# Patient Record
Sex: Male | Born: 1960
Health system: Southern US, Community
[De-identification: ages and names within clinical notes are randomized; demographics above are authoritative.]

## PROBLEM LIST (undated history)

## (undated) DIAGNOSIS — E46 Unspecified protein-calorie malnutrition: Secondary | ICD-10-CM

## (undated) DIAGNOSIS — N189 Chronic kidney disease, unspecified: Secondary | ICD-10-CM

## (undated) DIAGNOSIS — J189 Pneumonia, unspecified organism: Secondary | ICD-10-CM

## (undated) DIAGNOSIS — E785 Hyperlipidemia, unspecified: Secondary | ICD-10-CM

## (undated) DIAGNOSIS — Z8619 Personal history of other infectious and parasitic diseases: Secondary | ICD-10-CM

## (undated) DIAGNOSIS — Z86718 Personal history of other venous thrombosis and embolism: Secondary | ICD-10-CM

## (undated) DIAGNOSIS — M25552 Pain in left hip: Secondary | ICD-10-CM

## (undated) DIAGNOSIS — B20 Human immunodeficiency virus [HIV] disease: Secondary | ICD-10-CM

## (undated) DIAGNOSIS — Z21 Asymptomatic human immunodeficiency virus [HIV] infection status: Secondary | ICD-10-CM

## (undated) HISTORY — DX: Personal history of other infectious and parasitic diseases: Z86.19

## (undated) HISTORY — DX: Hyperlipidemia, unspecified: E78.5

## (undated) HISTORY — DX: Personal history of other venous thrombosis and embolism: Z86.718

## (undated) HISTORY — PX: APPENDECTOMY: SHX54

## (undated) HISTORY — DX: Pain in left hip: M25.552

---

## 1998-02-02 ENCOUNTER — Encounter (HOSPITAL_COMMUNITY): Admission: RE | Admit: 1998-02-02 | Discharge: 1998-05-03 | Payer: Self-pay | Admitting: Family Medicine

## 1998-07-13 ENCOUNTER — Encounter (HOSPITAL_COMMUNITY): Admission: RE | Admit: 1998-07-13 | Discharge: 1998-10-11 | Payer: Self-pay | Admitting: *Deleted

## 1998-07-19 ENCOUNTER — Encounter: Admission: RE | Admit: 1998-07-19 | Discharge: 1998-07-19 | Payer: Self-pay | Admitting: Hematology and Oncology

## 1998-07-28 ENCOUNTER — Encounter: Admission: RE | Admit: 1998-07-28 | Discharge: 1998-07-28 | Payer: Self-pay | Admitting: Hematology and Oncology

## 1998-08-07 ENCOUNTER — Encounter: Payer: Self-pay | Admitting: Emergency Medicine

## 1998-08-07 ENCOUNTER — Emergency Department (HOSPITAL_COMMUNITY): Admission: EM | Admit: 1998-08-07 | Discharge: 1998-08-07 | Payer: Self-pay | Admitting: Emergency Medicine

## 1998-08-12 ENCOUNTER — Encounter: Admission: RE | Admit: 1998-08-12 | Discharge: 1998-08-12 | Payer: Self-pay | Admitting: Internal Medicine

## 1998-09-14 ENCOUNTER — Encounter: Admission: RE | Admit: 1998-09-14 | Discharge: 1998-09-14 | Payer: Self-pay | Admitting: Internal Medicine

## 1998-10-13 ENCOUNTER — Encounter (HOSPITAL_COMMUNITY): Admission: RE | Admit: 1998-10-13 | Discharge: 1999-01-11 | Payer: Self-pay | Admitting: *Deleted

## 1999-02-10 ENCOUNTER — Encounter: Admission: RE | Admit: 1999-02-10 | Discharge: 1999-02-10 | Payer: Self-pay | Admitting: Hematology and Oncology

## 1999-02-10 ENCOUNTER — Ambulatory Visit (HOSPITAL_COMMUNITY): Admission: RE | Admit: 1999-02-10 | Discharge: 1999-02-10 | Payer: Self-pay | Admitting: Hematology and Oncology

## 1999-02-10 ENCOUNTER — Encounter (INDEPENDENT_AMBULATORY_CARE_PROVIDER_SITE_OTHER): Payer: Self-pay | Admitting: *Deleted

## 1999-02-10 LAB — CONVERTED CEMR LAB: CD4 Count: 160 microliters

## 1999-02-16 ENCOUNTER — Encounter (HOSPITAL_COMMUNITY): Admission: RE | Admit: 1999-02-16 | Discharge: 1999-05-17 | Payer: Self-pay | Admitting: *Deleted

## 1999-05-18 ENCOUNTER — Encounter (HOSPITAL_COMMUNITY): Admission: RE | Admit: 1999-05-18 | Discharge: 1999-05-30 | Payer: Self-pay | Admitting: *Deleted

## 1999-05-31 ENCOUNTER — Encounter (HOSPITAL_COMMUNITY): Admission: RE | Admit: 1999-05-31 | Discharge: 1999-08-29 | Payer: Self-pay | Admitting: *Deleted

## 1999-08-11 ENCOUNTER — Ambulatory Visit (HOSPITAL_COMMUNITY): Admission: RE | Admit: 1999-08-11 | Discharge: 1999-08-11 | Payer: Self-pay | Admitting: Hematology and Oncology

## 1999-08-11 ENCOUNTER — Encounter: Admission: RE | Admit: 1999-08-11 | Discharge: 1999-08-11 | Payer: Self-pay | Admitting: Hematology and Oncology

## 1999-08-17 ENCOUNTER — Encounter (HOSPITAL_COMMUNITY): Admission: RE | Admit: 1999-08-17 | Discharge: 1999-11-15 | Payer: Self-pay | Admitting: *Deleted

## 1999-08-25 ENCOUNTER — Encounter: Admission: RE | Admit: 1999-08-25 | Discharge: 1999-08-25 | Payer: Self-pay | Admitting: Internal Medicine

## 1999-08-30 ENCOUNTER — Encounter (HOSPITAL_COMMUNITY): Admission: RE | Admit: 1999-08-30 | Discharge: 1999-11-28 | Payer: Self-pay | Admitting: *Deleted

## 1999-12-01 ENCOUNTER — Encounter: Admission: RE | Admit: 1999-12-01 | Discharge: 1999-12-01 | Payer: Self-pay | Admitting: Hematology and Oncology

## 1999-12-01 ENCOUNTER — Ambulatory Visit (HOSPITAL_COMMUNITY): Admission: RE | Admit: 1999-12-01 | Discharge: 1999-12-01 | Payer: Self-pay | Admitting: Internal Medicine

## 1999-12-21 ENCOUNTER — Ambulatory Visit (HOSPITAL_COMMUNITY): Admission: RE | Admit: 1999-12-21 | Discharge: 1999-12-21 | Payer: Self-pay | Admitting: *Deleted

## 2000-01-18 ENCOUNTER — Encounter (HOSPITAL_COMMUNITY): Admission: RE | Admit: 2000-01-18 | Discharge: 2000-04-17 | Payer: Self-pay | Admitting: *Deleted

## 2000-04-18 ENCOUNTER — Encounter (HOSPITAL_COMMUNITY): Admission: RE | Admit: 2000-04-18 | Discharge: 2000-07-17 | Payer: Self-pay | Admitting: *Deleted

## 2000-07-18 ENCOUNTER — Encounter (HOSPITAL_COMMUNITY): Admission: RE | Admit: 2000-07-18 | Discharge: 2000-10-16 | Payer: Self-pay | Admitting: *Deleted

## 2000-07-30 ENCOUNTER — Encounter: Admission: RE | Admit: 2000-07-30 | Discharge: 2000-07-30 | Payer: Self-pay | Admitting: Internal Medicine

## 2000-07-31 ENCOUNTER — Ambulatory Visit (HOSPITAL_COMMUNITY): Admission: RE | Admit: 2000-07-31 | Discharge: 2000-07-31 | Payer: Self-pay | Admitting: Hematology and Oncology

## 2000-09-24 ENCOUNTER — Encounter: Admission: RE | Admit: 2000-09-24 | Discharge: 2000-09-24 | Payer: Self-pay | Admitting: Internal Medicine

## 2000-10-17 ENCOUNTER — Encounter (HOSPITAL_COMMUNITY): Admission: RE | Admit: 2000-10-17 | Discharge: 2001-01-15 | Payer: Self-pay | Admitting: *Deleted

## 2000-11-14 ENCOUNTER — Encounter: Admission: RE | Admit: 2000-11-14 | Discharge: 2000-11-14 | Payer: Self-pay | Admitting: Internal Medicine

## 2000-11-14 ENCOUNTER — Ambulatory Visit (HOSPITAL_COMMUNITY): Admission: RE | Admit: 2000-11-14 | Discharge: 2000-11-14 | Payer: Self-pay | Admitting: Internal Medicine

## 2000-11-27 ENCOUNTER — Encounter: Admission: RE | Admit: 2000-11-27 | Discharge: 2000-11-27 | Payer: Self-pay | Admitting: Hematology and Oncology

## 2001-01-16 ENCOUNTER — Encounter (HOSPITAL_COMMUNITY): Admission: RE | Admit: 2001-01-16 | Discharge: 2001-04-16 | Payer: Self-pay | Admitting: *Deleted

## 2001-04-17 ENCOUNTER — Encounter (HOSPITAL_COMMUNITY): Admission: RE | Admit: 2001-04-17 | Discharge: 2001-07-16 | Payer: Self-pay | Admitting: *Deleted

## 2001-05-15 ENCOUNTER — Ambulatory Visit (HOSPITAL_COMMUNITY): Admission: RE | Admit: 2001-05-15 | Discharge: 2001-05-15 | Payer: Self-pay | Admitting: Internal Medicine

## 2001-05-15 ENCOUNTER — Encounter: Admission: RE | Admit: 2001-05-15 | Discharge: 2001-05-15 | Payer: Self-pay | Admitting: Internal Medicine

## 2001-05-20 ENCOUNTER — Emergency Department (HOSPITAL_COMMUNITY): Admission: EM | Admit: 2001-05-20 | Discharge: 2001-05-20 | Payer: Self-pay | Admitting: Emergency Medicine

## 2001-05-24 ENCOUNTER — Ambulatory Visit (HOSPITAL_COMMUNITY): Admission: RE | Admit: 2001-05-24 | Discharge: 2001-05-24 | Payer: Self-pay | Admitting: Internal Medicine

## 2001-05-24 ENCOUNTER — Encounter: Admission: RE | Admit: 2001-05-24 | Discharge: 2001-05-24 | Payer: Self-pay | Admitting: Internal Medicine

## 2001-05-24 ENCOUNTER — Encounter: Payer: Self-pay | Admitting: Internal Medicine

## 2001-06-05 ENCOUNTER — Encounter: Admission: RE | Admit: 2001-06-05 | Discharge: 2001-06-05 | Payer: Self-pay | Admitting: Internal Medicine

## 2001-07-17 ENCOUNTER — Encounter (HOSPITAL_COMMUNITY): Admission: RE | Admit: 2001-07-17 | Discharge: 2001-10-15 | Payer: Self-pay | Admitting: *Deleted

## 2001-07-23 ENCOUNTER — Encounter: Admission: RE | Admit: 2001-07-23 | Discharge: 2001-07-23 | Payer: Self-pay | Admitting: Internal Medicine

## 2001-07-23 ENCOUNTER — Ambulatory Visit (HOSPITAL_COMMUNITY): Admission: RE | Admit: 2001-07-23 | Discharge: 2001-07-23 | Payer: Self-pay | Admitting: Internal Medicine

## 2001-09-11 ENCOUNTER — Encounter: Admission: RE | Admit: 2001-09-11 | Discharge: 2001-09-11 | Payer: Self-pay

## 2001-09-11 ENCOUNTER — Ambulatory Visit (HOSPITAL_COMMUNITY): Admission: RE | Admit: 2001-09-11 | Discharge: 2001-09-11 | Payer: Self-pay | Admitting: *Deleted

## 2001-10-16 ENCOUNTER — Encounter (HOSPITAL_COMMUNITY): Admission: RE | Admit: 2001-10-16 | Discharge: 2002-01-14 | Payer: Self-pay | Admitting: *Deleted

## 2001-12-03 ENCOUNTER — Ambulatory Visit (HOSPITAL_COMMUNITY): Admission: RE | Admit: 2001-12-03 | Discharge: 2001-12-03 | Payer: Self-pay | Admitting: Internal Medicine

## 2001-12-03 ENCOUNTER — Encounter: Admission: RE | Admit: 2001-12-03 | Discharge: 2001-12-03 | Payer: Self-pay | Admitting: Internal Medicine

## 2002-03-04 ENCOUNTER — Ambulatory Visit (HOSPITAL_COMMUNITY): Admission: RE | Admit: 2002-03-04 | Discharge: 2002-03-04 | Payer: Self-pay | Admitting: Internal Medicine

## 2002-03-04 ENCOUNTER — Encounter: Admission: RE | Admit: 2002-03-04 | Discharge: 2002-03-04 | Payer: Self-pay | Admitting: Internal Medicine

## 2002-07-04 ENCOUNTER — Ambulatory Visit (HOSPITAL_COMMUNITY): Admission: RE | Admit: 2002-07-04 | Discharge: 2002-07-04 | Payer: Self-pay | Admitting: Internal Medicine

## 2002-07-04 ENCOUNTER — Encounter: Admission: RE | Admit: 2002-07-04 | Discharge: 2002-07-04 | Payer: Self-pay | Admitting: Internal Medicine

## 2003-01-29 ENCOUNTER — Encounter: Admission: RE | Admit: 2003-01-29 | Discharge: 2003-01-29 | Payer: Self-pay | Admitting: Internal Medicine

## 2003-05-14 ENCOUNTER — Emergency Department (HOSPITAL_COMMUNITY): Admission: EM | Admit: 2003-05-14 | Discharge: 2003-05-14 | Payer: Self-pay | Admitting: Emergency Medicine

## 2003-09-01 ENCOUNTER — Encounter (INDEPENDENT_AMBULATORY_CARE_PROVIDER_SITE_OTHER): Payer: Self-pay | Admitting: Internal Medicine

## 2003-09-01 ENCOUNTER — Ambulatory Visit (HOSPITAL_COMMUNITY): Admission: RE | Admit: 2003-09-01 | Discharge: 2003-09-01 | Payer: Self-pay | Admitting: Internal Medicine

## 2003-09-01 ENCOUNTER — Encounter: Admission: RE | Admit: 2003-09-01 | Discharge: 2003-09-01 | Payer: Self-pay | Admitting: Internal Medicine

## 2004-11-02 ENCOUNTER — Ambulatory Visit: Payer: Self-pay | Admitting: Internal Medicine

## 2004-11-02 ENCOUNTER — Ambulatory Visit (HOSPITAL_COMMUNITY): Admission: RE | Admit: 2004-11-02 | Discharge: 2004-11-02 | Payer: Self-pay | Admitting: Internal Medicine

## 2004-11-16 ENCOUNTER — Ambulatory Visit: Payer: Self-pay | Admitting: Internal Medicine

## 2005-03-13 ENCOUNTER — Ambulatory Visit: Payer: Self-pay | Admitting: Internal Medicine

## 2005-03-13 ENCOUNTER — Ambulatory Visit (HOSPITAL_COMMUNITY): Admission: RE | Admit: 2005-03-13 | Discharge: 2005-03-13 | Payer: Self-pay | Admitting: Internal Medicine

## 2005-03-28 ENCOUNTER — Ambulatory Visit: Payer: Self-pay | Admitting: Internal Medicine

## 2005-09-14 ENCOUNTER — Encounter (INDEPENDENT_AMBULATORY_CARE_PROVIDER_SITE_OTHER): Payer: Self-pay | Admitting: *Deleted

## 2005-09-14 ENCOUNTER — Ambulatory Visit: Payer: Self-pay | Admitting: Internal Medicine

## 2005-09-14 ENCOUNTER — Ambulatory Visit (HOSPITAL_COMMUNITY): Admission: RE | Admit: 2005-09-14 | Discharge: 2005-09-14 | Payer: Self-pay | Admitting: Internal Medicine

## 2005-09-14 LAB — CONVERTED CEMR LAB: HIV 1 RNA Quant: 399 copies/mL

## 2005-09-27 ENCOUNTER — Ambulatory Visit: Payer: Self-pay | Admitting: Internal Medicine

## 2006-04-03 ENCOUNTER — Encounter: Admission: RE | Admit: 2006-04-03 | Discharge: 2006-04-03 | Payer: Self-pay | Admitting: Internal Medicine

## 2006-04-03 ENCOUNTER — Encounter (INDEPENDENT_AMBULATORY_CARE_PROVIDER_SITE_OTHER): Payer: Self-pay | Admitting: *Deleted

## 2006-04-03 ENCOUNTER — Ambulatory Visit: Payer: Self-pay | Admitting: Internal Medicine

## 2006-04-03 LAB — CONVERTED CEMR LAB
CD4 Count: 850 microliters
HIV 1 RNA Quant: 399 copies/mL

## 2006-04-17 ENCOUNTER — Ambulatory Visit: Payer: Self-pay | Admitting: Internal Medicine

## 2006-11-05 ENCOUNTER — Encounter (INDEPENDENT_AMBULATORY_CARE_PROVIDER_SITE_OTHER): Payer: Self-pay | Admitting: *Deleted

## 2006-11-05 ENCOUNTER — Encounter: Admission: RE | Admit: 2006-11-05 | Discharge: 2006-11-05 | Payer: Self-pay | Admitting: Internal Medicine

## 2006-11-05 ENCOUNTER — Ambulatory Visit: Payer: Self-pay | Admitting: Internal Medicine

## 2006-11-05 LAB — CONVERTED CEMR LAB
ALT: 9 units/L (ref 0–53)
AST: 14 units/L (ref 0–37)
Albumin: 4.6 g/dL (ref 3.5–5.2)
Alkaline Phosphatase: 62 units/L (ref 39–117)
BUN: 8 mg/dL (ref 6–23)
Basophils Absolute: 0 10*3/uL (ref 0.0–0.1)
Basophils Relative: 0 % (ref 0–1)
CD4 Count: 780 microliters
Calcium: 9.6 mg/dL (ref 8.4–10.5)
Chloride: 102 meq/L (ref 96–112)
Creatinine, Ser: 1.04 mg/dL (ref 0.40–1.50)
Eosinophils Relative: 1 % (ref 0–5)
HCT: 43.2 % (ref 39.0–52.0)
HDL: 41 mg/dL (ref >39)
HIV 1 RNA Quant: 248 copies/mL
Hemoglobin, Urine: NEGATIVE
Ketones, ur: NEGATIVE mg/dL
LDL Cholesterol: 173 mg/dL — ABNORMAL HIGH (ref 0–99)
Leukocytes, UA: NEGATIVE
MCHC: 33.8 g/dL (ref 30.0–36.0)
MCV: 105.6 fL — ABNORMAL HIGH (ref 78.0–100.0)
Neutrophils Relative %: 28 % — ABNORMAL LOW (ref 43–77)
Nitrite: NEGATIVE
Platelets: 306 10*3/uL (ref 150–400)
Potassium: 4.4 meq/L (ref 3.5–5.3)
Protein, ur: NEGATIVE mg/dL
Total CHOL/HDL Ratio: 6.7
Urobilinogen, UA: 0.2 (ref 0.0–1.0)
pH: 6.5 (ref 5.0–8.0)

## 2006-11-10 DIAGNOSIS — G609 Hereditary and idiopathic neuropathy, unspecified: Secondary | ICD-10-CM | POA: Insufficient documentation

## 2006-11-10 DIAGNOSIS — F1010 Alcohol abuse, uncomplicated: Secondary | ICD-10-CM

## 2006-11-10 DIAGNOSIS — F29 Unspecified psychosis not due to a substance or known physiological condition: Secondary | ICD-10-CM | POA: Insufficient documentation

## 2006-11-10 DIAGNOSIS — F17200 Nicotine dependence, unspecified, uncomplicated: Secondary | ICD-10-CM | POA: Insufficient documentation

## 2006-11-10 DIAGNOSIS — B1910 Unspecified viral hepatitis B without hepatic coma: Secondary | ICD-10-CM | POA: Insufficient documentation

## 2006-11-10 DIAGNOSIS — K029 Dental caries, unspecified: Secondary | ICD-10-CM | POA: Insufficient documentation

## 2006-11-10 DIAGNOSIS — B029 Zoster without complications: Secondary | ICD-10-CM | POA: Insufficient documentation

## 2006-11-10 DIAGNOSIS — B20 Human immunodeficiency virus [HIV] disease: Secondary | ICD-10-CM | POA: Insufficient documentation

## 2006-11-10 DIAGNOSIS — F1011 Alcohol abuse, in remission: Secondary | ICD-10-CM | POA: Insufficient documentation

## 2006-11-10 DIAGNOSIS — J309 Allergic rhinitis, unspecified: Secondary | ICD-10-CM | POA: Insufficient documentation

## 2006-11-10 DIAGNOSIS — E785 Hyperlipidemia, unspecified: Secondary | ICD-10-CM

## 2006-11-19 ENCOUNTER — Ambulatory Visit: Payer: Self-pay | Admitting: Internal Medicine

## 2006-12-24 ENCOUNTER — Encounter: Payer: Self-pay | Admitting: Internal Medicine

## 2006-12-24 ENCOUNTER — Encounter (INDEPENDENT_AMBULATORY_CARE_PROVIDER_SITE_OTHER): Payer: Self-pay | Admitting: *Deleted

## 2007-01-06 ENCOUNTER — Encounter (INDEPENDENT_AMBULATORY_CARE_PROVIDER_SITE_OTHER): Payer: Self-pay | Admitting: *Deleted

## 2007-03-26 ENCOUNTER — Telehealth: Payer: Self-pay | Admitting: Internal Medicine

## 2007-03-28 ENCOUNTER — Encounter: Payer: Self-pay | Admitting: Internal Medicine

## 2007-04-23 ENCOUNTER — Telehealth: Payer: Self-pay | Admitting: Internal Medicine

## 2007-04-29 ENCOUNTER — Encounter: Admission: RE | Admit: 2007-04-29 | Discharge: 2007-04-29 | Payer: Self-pay | Admitting: Internal Medicine

## 2007-04-29 ENCOUNTER — Ambulatory Visit: Payer: Self-pay | Admitting: Internal Medicine

## 2007-04-29 LAB — CONVERTED CEMR LAB: HIV 1 RNA Quant: 229 copies/mL — ABNORMAL HIGH (ref <50)

## 2007-04-30 ENCOUNTER — Encounter: Payer: Self-pay | Admitting: Internal Medicine

## 2007-05-14 ENCOUNTER — Ambulatory Visit: Payer: Self-pay | Admitting: Internal Medicine

## 2007-05-21 ENCOUNTER — Telehealth: Payer: Self-pay | Admitting: Internal Medicine

## 2007-08-19 ENCOUNTER — Ambulatory Visit: Payer: Self-pay | Admitting: *Deleted

## 2007-12-23 ENCOUNTER — Ambulatory Visit: Payer: Self-pay | Admitting: Internal Medicine

## 2007-12-23 ENCOUNTER — Encounter: Admission: RE | Admit: 2007-12-23 | Discharge: 2007-12-23 | Payer: Self-pay | Admitting: Internal Medicine

## 2007-12-23 LAB — CONVERTED CEMR LAB
BUN: 10 mg/dL (ref 6–23)
CO2: 22 meq/L (ref 19–32)
Cholesterol: 210 mg/dL — ABNORMAL HIGH (ref 0–200)
Glucose, Bld: 152 mg/dL — ABNORMAL HIGH (ref 70–99)
HCT: 40.8 % (ref 39.0–52.0)
HDL: 33 mg/dL — ABNORMAL LOW (ref >39)
HIV-1 RNA Quant, Log: <1.7 (ref <1.70)
Hemoglobin: 14.1 g/dL (ref 13.0–17.0)
MCHC: 34.6 g/dL (ref 30.0–36.0)
MCV: 104.9 fL — ABNORMAL HIGH (ref 78.0–100.0)
RBC: 3.89 M/uL — ABNORMAL LOW (ref 4.22–5.81)
Sodium: 137 meq/L (ref 135–145)
Total Bilirubin: 0.8 mg/dL (ref 0.3–1.2)
Total Protein: 7.7 g/dL (ref 6.0–8.3)
Triglycerides: 327 mg/dL — ABNORMAL HIGH (ref <150)
VLDL: 65 mg/dL — ABNORMAL HIGH (ref 0–40)
WBC: 8.2 10*3/uL (ref 4.0–10.5)

## 2007-12-26 ENCOUNTER — Encounter (INDEPENDENT_AMBULATORY_CARE_PROVIDER_SITE_OTHER): Payer: Self-pay | Admitting: *Deleted

## 2008-04-08 ENCOUNTER — Encounter: Admission: RE | Admit: 2008-04-08 | Discharge: 2008-04-08 | Payer: Self-pay | Admitting: Internal Medicine

## 2008-04-08 ENCOUNTER — Other Ambulatory Visit: Payer: Self-pay | Admitting: Internal Medicine

## 2008-04-08 ENCOUNTER — Ambulatory Visit: Payer: Self-pay | Admitting: Internal Medicine

## 2008-04-08 LAB — CONVERTED CEMR LAB
CO2: 22 meq/L (ref 19–32)
Creatinine, Ser: 1.1 mg/dL (ref 0.40–1.50)
Eosinophils Relative: 1 % (ref 0–5)
Glucose, Bld: 79 mg/dL (ref 70–99)
HCT: 37.5 % — ABNORMAL LOW (ref 39.0–52.0)
HIV 1 RNA Quant: 367 copies/mL — ABNORMAL HIGH (ref <50)
HIV-1 RNA Quant, Log: 2.56 — ABNORMAL HIGH (ref <1.70)
Hemoglobin: 13 g/dL (ref 13.0–17.0)
Lymphocytes Relative: 63 % — ABNORMAL HIGH (ref 12–46)
Lymphs Abs: 4.8 10*3/uL — ABNORMAL HIGH (ref 0.7–4.0)
MCV: 102.5 fL — ABNORMAL HIGH (ref 78.0–100.0)
Monocytes Absolute: 0.6 10*3/uL (ref 0.1–1.0)
RDW: 14.4 % (ref 11.5–15.5)
Total Bilirubin: 1.5 mg/dL — ABNORMAL HIGH (ref 0.3–1.2)
WBC: 7.6 10*3/uL (ref 4.0–10.5)

## 2008-04-21 ENCOUNTER — Ambulatory Visit: Payer: Self-pay | Admitting: Internal Medicine

## 2008-04-21 LAB — CONVERTED CEMR LAB
Cholesterol: 245 mg/dL — ABNORMAL HIGH (ref 0–200)
HDL: 43 mg/dL (ref >39)
Triglycerides: 196 mg/dL — ABNORMAL HIGH (ref <150)

## 2008-10-30 HISTORY — PX: LEG AMPUTATION ABOVE KNEE: SHX117

## 2008-12-17 ENCOUNTER — Ambulatory Visit: Payer: Self-pay | Admitting: Internal Medicine

## 2008-12-17 LAB — CONVERTED CEMR LAB
ALT: 11 units/L (ref 0–53)
Basophils Absolute: 0 10*3/uL (ref 0.0–0.1)
CO2: 24 meq/L (ref 19–32)
Cholesterol: 257 mg/dL — ABNORMAL HIGH (ref 0–200)
Creatinine, Ser: 0.93 mg/dL (ref 0.40–1.50)
Eosinophils Relative: 1 % (ref 0–5)
HCT: 41 % (ref 39.0–52.0)
HIV 1 RNA Quant: 192 copies/mL — ABNORMAL HIGH (ref <48)
Hemoglobin: 14.4 g/dL (ref 13.0–17.0)
Lymphocytes Relative: 59 % — ABNORMAL HIGH (ref 12–46)
MCHC: 35.1 g/dL (ref 30.0–36.0)
Monocytes Absolute: 0.3 10*3/uL (ref 0.1–1.0)
RDW: 14.8 % (ref 11.5–15.5)
Total Bilirubin: 0.9 mg/dL (ref 0.3–1.2)
Total CHOL/HDL Ratio: 6.8
Triglycerides: 269 mg/dL — ABNORMAL HIGH (ref <150)
VLDL: 54 mg/dL — ABNORMAL HIGH (ref 0–40)

## 2008-12-29 ENCOUNTER — Ambulatory Visit: Payer: Self-pay | Admitting: Internal Medicine

## 2009-02-06 ENCOUNTER — Emergency Department (HOSPITAL_COMMUNITY): Admission: EM | Admit: 2009-02-06 | Discharge: 2009-02-06 | Payer: Self-pay | Admitting: Emergency Medicine

## 2009-03-05 ENCOUNTER — Emergency Department (HOSPITAL_COMMUNITY): Admission: EM | Admit: 2009-03-05 | Discharge: 2009-03-05 | Payer: Self-pay | Admitting: Emergency Medicine

## 2009-03-08 ENCOUNTER — Telehealth (INDEPENDENT_AMBULATORY_CARE_PROVIDER_SITE_OTHER): Payer: Self-pay | Admitting: *Deleted

## 2009-03-09 ENCOUNTER — Encounter: Payer: Self-pay | Admitting: Infectious Diseases

## 2009-03-09 ENCOUNTER — Ambulatory Visit: Admission: RE | Admit: 2009-03-09 | Discharge: 2009-03-09 | Payer: Self-pay | Admitting: Infectious Diseases

## 2009-03-09 ENCOUNTER — Ambulatory Visit: Payer: Self-pay | Admitting: Infectious Diseases

## 2009-03-09 ENCOUNTER — Ambulatory Visit: Payer: Self-pay | Admitting: *Deleted

## 2009-03-09 LAB — CONVERTED CEMR LAB
BUN: 7 mg/dL (ref 6–23)
Basophils Relative: 0 % (ref 0–1)
CO2: 28 meq/L (ref 19–32)
CRP: 1.1 mg/dL — ABNORMAL HIGH (ref <0.6)
Calcium: 9.4 mg/dL (ref 8.4–10.5)
Chloride: 100 meq/L (ref 96–112)
Creatinine, Ser: 0.98 mg/dL (ref 0.40–1.50)
GFR calc Af Amer: >60 mL/min (ref >60)
GFR calc non Af Amer: >60 mL/min (ref >60)
Glucose, Bld: 92 mg/dL (ref 70–99)
Lymphs Abs: 2.7 10*3/uL (ref 0.7–4.0)
Monocytes Relative: 7 % (ref 3–12)
Neutro Abs: 2.6 10*3/uL (ref 1.7–7.7)
Neutrophils Relative %: 45 % (ref 43–77)
RBC: 3.79 M/uL — ABNORMAL LOW (ref 4.22–5.81)
Sed Rate: 10 mm/hr (ref 0–16)
WBC: 5.8 10*3/uL (ref 4.0–10.5)

## 2009-03-12 ENCOUNTER — Telehealth: Payer: Self-pay | Admitting: Infectious Diseases

## 2009-03-16 ENCOUNTER — Ambulatory Visit: Payer: Self-pay | Admitting: Infectious Diseases

## 2009-03-24 ENCOUNTER — Ambulatory Visit: Payer: Self-pay | Admitting: Infectious Diseases

## 2009-03-24 LAB — CONVERTED CEMR LAB
Calcium: 9.7 mg/dL (ref 8.4–10.5)
Creatinine, Ser: 0.87 mg/dL (ref 0.40–1.50)
GFR calc Af Amer: >60 mL/min (ref >60)
Glucose, Bld: 82 mg/dL (ref 70–99)
Sodium: 138 meq/L (ref 135–145)

## 2009-04-01 ENCOUNTER — Telehealth: Payer: Self-pay

## 2009-04-20 ENCOUNTER — Telehealth: Payer: Self-pay

## 2009-04-21 ENCOUNTER — Ambulatory Visit: Payer: Self-pay | Admitting: Internal Medicine

## 2009-05-03 ENCOUNTER — Telehealth (INDEPENDENT_AMBULATORY_CARE_PROVIDER_SITE_OTHER): Payer: Self-pay | Admitting: Internal Medicine

## 2009-05-04 ENCOUNTER — Telehealth: Payer: Self-pay | Admitting: Internal Medicine

## 2009-05-17 ENCOUNTER — Telehealth (INDEPENDENT_AMBULATORY_CARE_PROVIDER_SITE_OTHER): Payer: Self-pay | Admitting: *Deleted

## 2009-05-18 ENCOUNTER — Ambulatory Visit: Payer: Self-pay | Admitting: Internal Medicine

## 2009-05-19 LAB — CONVERTED CEMR LAB: Uric Acid, Serum: 3.2 mg/dL — ABNORMAL LOW (ref 4.0–7.8)

## 2009-05-24 ENCOUNTER — Ambulatory Visit (HOSPITAL_COMMUNITY): Admission: RE | Admit: 2009-05-24 | Discharge: 2009-05-24 | Payer: Self-pay | Admitting: Internal Medicine

## 2009-05-24 ENCOUNTER — Telehealth: Payer: Self-pay | Admitting: Internal Medicine

## 2009-05-28 ENCOUNTER — Ambulatory Visit: Payer: Self-pay | Admitting: Internal Medicine

## 2009-06-04 ENCOUNTER — Telehealth: Payer: Self-pay | Admitting: Internal Medicine

## 2009-06-07 ENCOUNTER — Telehealth: Payer: Self-pay | Admitting: Internal Medicine

## 2009-06-07 ENCOUNTER — Encounter: Payer: Self-pay | Admitting: Internal Medicine

## 2009-06-08 ENCOUNTER — Ambulatory Visit (HOSPITAL_COMMUNITY): Admission: RE | Admit: 2009-06-08 | Discharge: 2009-06-08 | Payer: Self-pay | Admitting: Internal Medicine

## 2009-06-09 ENCOUNTER — Telehealth: Payer: Self-pay | Admitting: Internal Medicine

## 2009-06-14 ENCOUNTER — Telehealth (INDEPENDENT_AMBULATORY_CARE_PROVIDER_SITE_OTHER): Payer: Self-pay | Admitting: Internal Medicine

## 2009-06-15 ENCOUNTER — Telehealth: Payer: Self-pay | Admitting: Internal Medicine

## 2009-06-15 ENCOUNTER — Telehealth (INDEPENDENT_AMBULATORY_CARE_PROVIDER_SITE_OTHER): Payer: Self-pay | Admitting: *Deleted

## 2009-06-22 ENCOUNTER — Encounter: Payer: Self-pay | Admitting: Infectious Disease

## 2009-06-22 ENCOUNTER — Ambulatory Visit: Payer: Self-pay | Admitting: Infectious Diseases

## 2009-06-22 ENCOUNTER — Ambulatory Visit: Payer: Self-pay | Admitting: Infectious Disease

## 2009-06-22 ENCOUNTER — Inpatient Hospital Stay (HOSPITAL_COMMUNITY): Admission: AD | Admit: 2009-06-22 | Discharge: 2009-07-15 | Payer: Self-pay | Admitting: Infectious Disease

## 2009-06-22 ENCOUNTER — Ambulatory Visit: Payer: Self-pay | Admitting: Internal Medicine

## 2009-06-22 ENCOUNTER — Encounter (INDEPENDENT_AMBULATORY_CARE_PROVIDER_SITE_OTHER): Payer: Self-pay | Admitting: Internal Medicine

## 2009-06-24 ENCOUNTER — Ambulatory Visit: Payer: Self-pay | Admitting: Vascular Surgery

## 2009-06-28 ENCOUNTER — Encounter: Payer: Self-pay | Admitting: Vascular Surgery

## 2009-06-28 ENCOUNTER — Telehealth: Payer: Self-pay | Admitting: Infectious Disease

## 2009-06-29 ENCOUNTER — Ambulatory Visit: Payer: Self-pay | Admitting: Physical Medicine & Rehabilitation

## 2009-06-29 ENCOUNTER — Ambulatory Visit: Payer: Self-pay | Admitting: Oncology

## 2009-07-06 ENCOUNTER — Telehealth: Payer: Self-pay | Admitting: Licensed Clinical Social Worker

## 2009-07-06 ENCOUNTER — Telehealth: Payer: Self-pay | Admitting: Internal Medicine

## 2009-07-13 ENCOUNTER — Ambulatory Visit: Payer: Self-pay | Admitting: Oncology

## 2009-07-15 ENCOUNTER — Encounter (INDEPENDENT_AMBULATORY_CARE_PROVIDER_SITE_OTHER): Payer: Self-pay | Admitting: Dermatology

## 2009-07-15 DIAGNOSIS — E291 Testicular hypofunction: Secondary | ICD-10-CM

## 2009-07-15 DIAGNOSIS — Z89619 Acquired absence of unspecified leg above knee: Secondary | ICD-10-CM | POA: Insufficient documentation

## 2009-07-15 DIAGNOSIS — R911 Solitary pulmonary nodule: Secondary | ICD-10-CM

## 2009-07-15 DIAGNOSIS — E568 Deficiency of other vitamins: Secondary | ICD-10-CM | POA: Insufficient documentation

## 2009-07-15 DIAGNOSIS — D72829 Elevated white blood cell count, unspecified: Secondary | ICD-10-CM | POA: Insufficient documentation

## 2009-07-19 ENCOUNTER — Ambulatory Visit: Payer: Self-pay | Admitting: Internal Medicine

## 2009-07-19 DIAGNOSIS — Z86718 Personal history of other venous thrombosis and embolism: Secondary | ICD-10-CM | POA: Insufficient documentation

## 2009-07-20 ENCOUNTER — Inpatient Hospital Stay (HOSPITAL_COMMUNITY): Admission: AD | Admit: 2009-07-20 | Discharge: 2009-07-21 | Payer: Self-pay | Admitting: Infectious Diseases

## 2009-07-20 ENCOUNTER — Ambulatory Visit: Payer: Self-pay | Admitting: Infectious Disease

## 2009-07-20 ENCOUNTER — Encounter (INDEPENDENT_AMBULATORY_CARE_PROVIDER_SITE_OTHER): Payer: Self-pay | Admitting: Internal Medicine

## 2009-07-20 ENCOUNTER — Ambulatory Visit: Payer: Self-pay | Admitting: Infectious Diseases

## 2009-07-20 DIAGNOSIS — R599 Enlarged lymph nodes, unspecified: Secondary | ICD-10-CM | POA: Insufficient documentation

## 2009-07-21 ENCOUNTER — Encounter (INDEPENDENT_AMBULATORY_CARE_PROVIDER_SITE_OTHER): Payer: Self-pay | Admitting: Dermatology

## 2009-07-22 ENCOUNTER — Ambulatory Visit: Payer: Self-pay | Admitting: Internal Medicine

## 2009-07-22 LAB — CONVERTED CEMR LAB: INR: 2

## 2009-07-26 ENCOUNTER — Ambulatory Visit: Payer: Self-pay | Admitting: Infectious Diseases

## 2009-07-26 LAB — CONVERTED CEMR LAB: INR: 1.7

## 2009-07-27 ENCOUNTER — Telehealth: Payer: Self-pay | Admitting: Internal Medicine

## 2009-08-02 ENCOUNTER — Ambulatory Visit: Payer: Self-pay | Admitting: Internal Medicine

## 2009-08-02 LAB — CONVERTED CEMR LAB: INR: 3.3

## 2009-08-05 ENCOUNTER — Ambulatory Visit: Payer: Self-pay | Admitting: Internal Medicine

## 2009-08-05 DIAGNOSIS — A4902 Methicillin resistant Staphylococcus aureus infection, unspecified site: Secondary | ICD-10-CM | POA: Insufficient documentation

## 2009-08-06 ENCOUNTER — Ambulatory Visit: Payer: Self-pay | Admitting: Vascular Surgery

## 2009-08-06 ENCOUNTER — Encounter (INDEPENDENT_AMBULATORY_CARE_PROVIDER_SITE_OTHER): Payer: Self-pay | Admitting: *Deleted

## 2009-08-06 ENCOUNTER — Encounter: Payer: Self-pay | Admitting: Internal Medicine

## 2009-08-09 ENCOUNTER — Ambulatory Visit: Payer: Self-pay | Admitting: Internal Medicine

## 2009-08-12 ENCOUNTER — Telehealth (INDEPENDENT_AMBULATORY_CARE_PROVIDER_SITE_OTHER): Payer: Self-pay | Admitting: *Deleted

## 2009-08-16 ENCOUNTER — Telehealth: Payer: Self-pay | Admitting: Internal Medicine

## 2009-08-20 ENCOUNTER — Encounter: Payer: Self-pay | Admitting: Internal Medicine

## 2009-08-23 ENCOUNTER — Ambulatory Visit: Payer: Self-pay | Admitting: Internal Medicine

## 2009-08-23 DIAGNOSIS — G547 Phantom limb syndrome without pain: Secondary | ICD-10-CM | POA: Insufficient documentation

## 2009-08-23 DIAGNOSIS — R066 Hiccough: Secondary | ICD-10-CM | POA: Insufficient documentation

## 2009-08-23 DIAGNOSIS — K219 Gastro-esophageal reflux disease without esophagitis: Secondary | ICD-10-CM | POA: Insufficient documentation

## 2009-08-23 LAB — CONVERTED CEMR LAB

## 2009-08-27 ENCOUNTER — Encounter: Payer: Self-pay | Admitting: Infectious Diseases

## 2009-08-31 ENCOUNTER — Encounter: Payer: Self-pay | Admitting: Infectious Diseases

## 2009-09-06 ENCOUNTER — Ambulatory Visit: Payer: Self-pay | Admitting: Internal Medicine

## 2009-09-06 LAB — CONVERTED CEMR LAB: INR: 2.8

## 2009-09-10 ENCOUNTER — Telehealth: Payer: Self-pay | Admitting: Infectious Disease

## 2009-09-20 ENCOUNTER — Ambulatory Visit: Payer: Self-pay | Admitting: Infectious Disease

## 2009-09-20 LAB — CONVERTED CEMR LAB: INR: 3.6

## 2009-09-21 ENCOUNTER — Telehealth: Payer: Self-pay | Admitting: Internal Medicine

## 2009-09-21 ENCOUNTER — Telehealth (INDEPENDENT_AMBULATORY_CARE_PROVIDER_SITE_OTHER): Payer: Self-pay | Admitting: *Deleted

## 2009-09-21 ENCOUNTER — Encounter: Payer: Self-pay | Admitting: Internal Medicine

## 2009-09-22 ENCOUNTER — Telehealth: Payer: Self-pay | Admitting: Internal Medicine

## 2009-09-28 ENCOUNTER — Ambulatory Visit: Payer: Self-pay | Admitting: Internal Medicine

## 2009-09-28 LAB — CONVERTED CEMR LAB
AST: 16 units/L (ref 0–37)
Albumin: 4.1 g/dL (ref 3.5–5.2)
BUN: 6 mg/dL (ref 6–23)
Calcium: 9.3 mg/dL (ref 8.4–10.5)
Chloride: 100 meq/L (ref 96–112)
Eosinophils Relative: 0 % (ref 0–5)
HCT: 41.7 % (ref 39.0–52.0)
HIV 1 RNA Quant: 110 copies/mL — ABNORMAL HIGH (ref <48)
Hemoglobin: 14.4 g/dL (ref 13.0–17.0)
Lymphocytes Relative: 40 % (ref 12–46)
Lymphs Abs: 4.7 10*3/uL — ABNORMAL HIGH (ref 0.7–4.0)
Monocytes Absolute: 0.7 10*3/uL (ref 0.1–1.0)
Potassium: 3.7 meq/L (ref 3.5–5.3)
Sodium: 137 meq/L (ref 135–145)
Total Protein: 6.9 g/dL (ref 6.0–8.3)
WBC: 12 10*3/uL — ABNORMAL HIGH (ref 4.0–10.5)

## 2009-10-04 ENCOUNTER — Encounter: Admission: RE | Admit: 2009-10-04 | Discharge: 2009-10-29 | Payer: Self-pay | Admitting: Vascular Surgery

## 2009-10-06 ENCOUNTER — Encounter: Payer: Self-pay | Admitting: Internal Medicine

## 2009-10-08 ENCOUNTER — Telehealth: Payer: Self-pay | Admitting: Internal Medicine

## 2009-10-11 ENCOUNTER — Ambulatory Visit: Payer: Self-pay | Admitting: Internal Medicine

## 2009-10-28 ENCOUNTER — Telehealth (INDEPENDENT_AMBULATORY_CARE_PROVIDER_SITE_OTHER): Payer: Self-pay | Admitting: *Deleted

## 2009-11-03 ENCOUNTER — Encounter: Payer: Self-pay | Admitting: Internal Medicine

## 2009-11-04 ENCOUNTER — Telehealth: Payer: Self-pay | Admitting: Internal Medicine

## 2009-11-08 ENCOUNTER — Ambulatory Visit: Payer: Self-pay | Admitting: Internal Medicine

## 2009-11-08 ENCOUNTER — Telehealth: Payer: Self-pay | Admitting: Internal Medicine

## 2009-11-08 LAB — CONVERTED CEMR LAB: INR: 1.4

## 2009-11-10 ENCOUNTER — Encounter: Admission: RE | Admit: 2009-11-10 | Discharge: 2010-02-08 | Payer: Self-pay | Admitting: Vascular Surgery

## 2009-11-17 ENCOUNTER — Encounter
Admission: RE | Admit: 2009-11-17 | Discharge: 2010-01-11 | Payer: Self-pay | Admitting: Physical Medicine & Rehabilitation

## 2009-11-18 ENCOUNTER — Ambulatory Visit: Payer: Self-pay | Admitting: Physical Medicine & Rehabilitation

## 2009-11-22 ENCOUNTER — Ambulatory Visit: Payer: Self-pay | Admitting: Internal Medicine

## 2009-11-22 LAB — CONVERTED CEMR LAB: INR: 1.6

## 2009-11-29 ENCOUNTER — Encounter: Payer: Self-pay | Admitting: Internal Medicine

## 2009-12-06 ENCOUNTER — Ambulatory Visit: Payer: Self-pay | Admitting: Internal Medicine

## 2009-12-06 LAB — CONVERTED CEMR LAB

## 2009-12-14 ENCOUNTER — Ambulatory Visit: Payer: Self-pay | Admitting: Internal Medicine

## 2009-12-14 LAB — CONVERTED CEMR LAB
ALT: 39 units/L (ref 0–53)
AST: 50 units/L — ABNORMAL HIGH (ref 0–37)
BUN: 5 mg/dL — ABNORMAL LOW (ref 6–23)
Basophils Absolute: 0 10*3/uL (ref 0.0–0.1)
Basophils Relative: 0 % (ref 0–1)
Calcium: 9.3 mg/dL (ref 8.4–10.5)
Creatinine, Ser: 0.92 mg/dL (ref 0.40–1.50)
Eosinophils Absolute: 0.1 10*3/uL (ref 0.0–0.7)
Eosinophils Relative: 1 % (ref 0–5)
HCT: 40.1 % (ref 39.0–52.0)
HIV-1 RNA Quant, Log: 2.54 — ABNORMAL HIGH (ref <1.68)
MCHC: 34.2 g/dL (ref 30.0–36.0)
MCV: 110.2 fL — ABNORMAL HIGH (ref >=78.0)
Neutrophils Relative %: 20 % — ABNORMAL LOW (ref 43–77)
Platelets: 356 10*3/uL (ref 150–400)
RDW: 13.8 % (ref 11.5–15.5)
Total Bilirubin: 0.8 mg/dL (ref 0.3–1.2)
WBC: 9.1 10*3/uL (ref 4.0–10.5)

## 2009-12-17 ENCOUNTER — Ambulatory Visit: Payer: Self-pay | Admitting: Physical Medicine & Rehabilitation

## 2009-12-20 ENCOUNTER — Encounter: Payer: Self-pay | Admitting: Internal Medicine

## 2009-12-28 ENCOUNTER — Ambulatory Visit: Payer: Self-pay | Admitting: Internal Medicine

## 2010-01-03 ENCOUNTER — Ambulatory Visit: Payer: Self-pay | Admitting: Internal Medicine

## 2010-01-03 LAB — CONVERTED CEMR LAB: INR: 2.4

## 2010-01-11 ENCOUNTER — Ambulatory Visit: Payer: Self-pay | Admitting: Physical Medicine & Rehabilitation

## 2010-01-11 ENCOUNTER — Encounter: Payer: Self-pay | Admitting: Internal Medicine

## 2010-01-28 ENCOUNTER — Ambulatory Visit: Payer: Self-pay | Admitting: Oncology

## 2010-02-07 ENCOUNTER — Ambulatory Visit: Payer: Self-pay | Admitting: Internal Medicine

## 2010-02-07 LAB — CONVERTED CEMR LAB: INR: 2.1

## 2010-02-11 ENCOUNTER — Encounter: Payer: Self-pay | Admitting: Internal Medicine

## 2010-02-16 ENCOUNTER — Telehealth: Payer: Self-pay | Admitting: Internal Medicine

## 2010-02-21 IMAGING — CR DG TOE 2ND 2+V*L*
3 series · 3 of 3 positions shown · non-contrast
Comparison: None

CLINICAL DATA: Draining wound on the dorsum of the left second
toe.

LEFT TOE - 2+ VIEW

[t toes ap left]
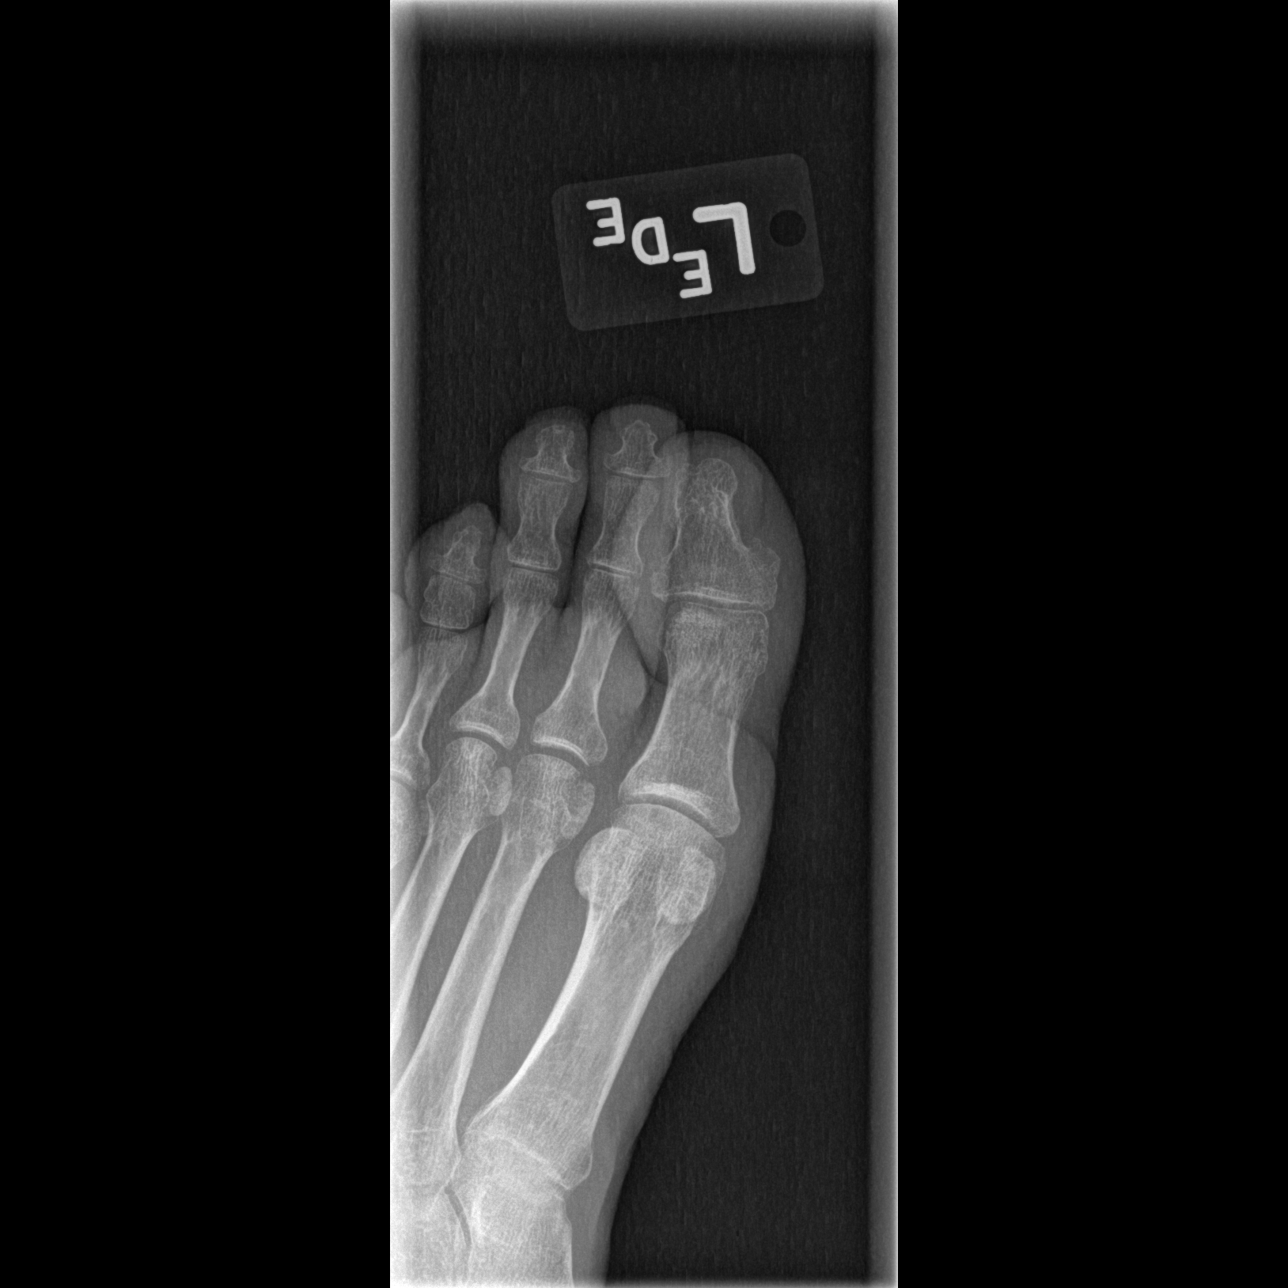

[t toes oblique left]
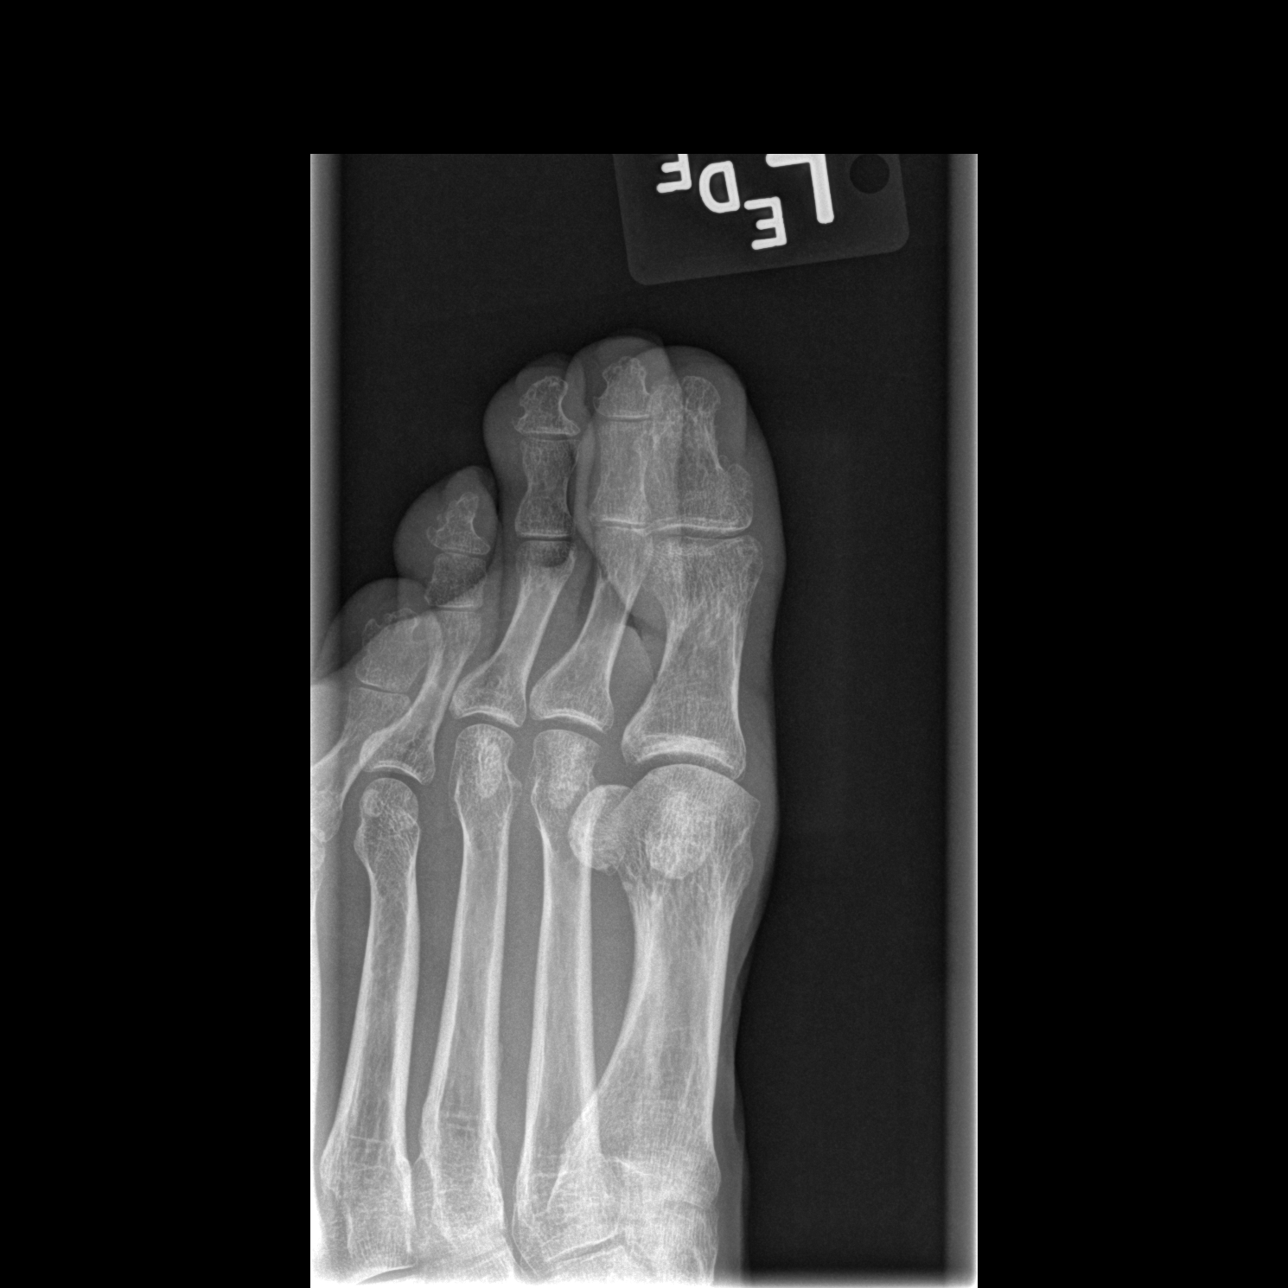

[t toes lateral left]
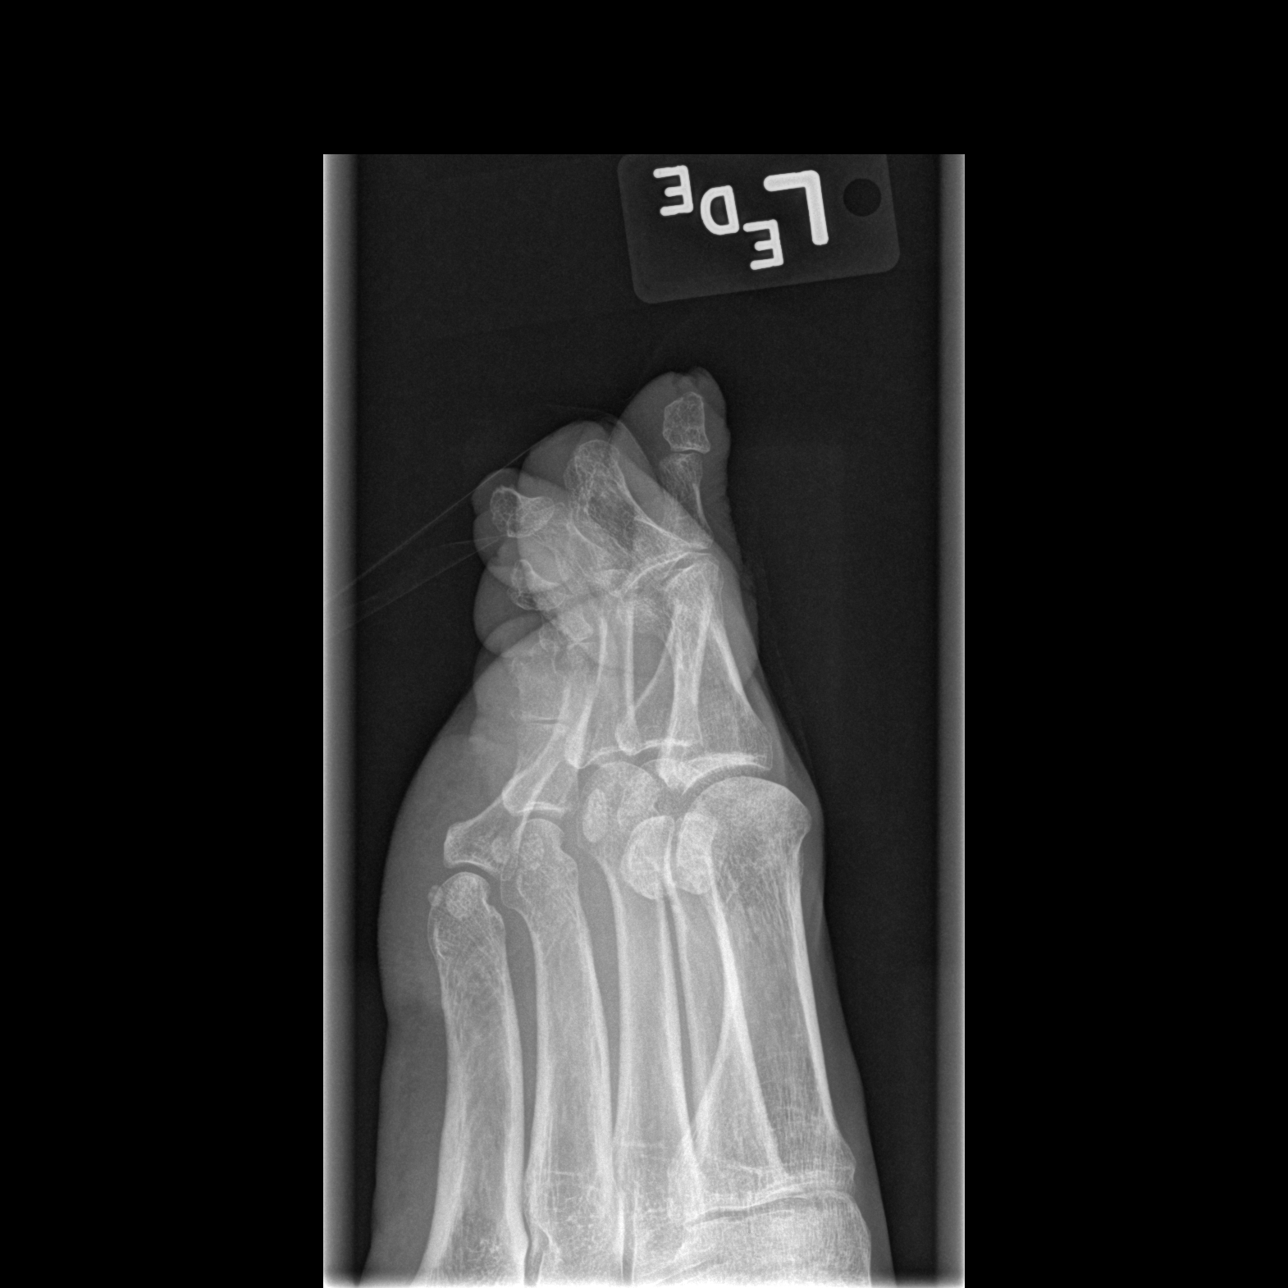

[3 of 3 positions shown; findings below may reference images not displayed]

FINDINGS: There is osteopenia but there is no discrete bone
destruction or significant arthritis.  No periosteal reaction.
IMPRESSION: No evidence of osteomyelitis or other acute bony abnormality.

## 2010-03-07 ENCOUNTER — Ambulatory Visit: Payer: Self-pay | Admitting: Internal Medicine

## 2010-03-07 LAB — CONVERTED CEMR LAB: INR: 2.3

## 2010-03-23 ENCOUNTER — Ambulatory Visit: Payer: Self-pay | Admitting: Oncology

## 2010-03-24 ENCOUNTER — Encounter: Payer: Self-pay | Admitting: Internal Medicine

## 2010-03-24 LAB — CBC & DIFF AND RETIC
BASO%: 0.1 % (ref 0.0–2.0)
LYMPH%: 63.8 % — ABNORMAL HIGH (ref 14.0–49.0)
MCHC: 35.1 g/dL (ref 32.0–36.0)
MCV: 102.6 fL — ABNORMAL HIGH (ref 79.3–98.0)
MONO#: 0.6 10*3/uL (ref 0.1–0.9)
MONO%: 7 % (ref 0.0–14.0)
Platelets: 308 10*3/uL (ref 140–400)
RBC: 3.78 10*6/uL — ABNORMAL LOW (ref 4.20–5.82)
RDW: 14.5 % (ref 11.0–14.6)
Retic %: 1.37 % (ref 0.50–1.60)
WBC: 8.7 10*3/uL (ref 4.0–10.3)

## 2010-03-24 LAB — COMPREHENSIVE METABOLIC PANEL
Alkaline Phosphatase: 55 U/L (ref 39–117)
BUN: 4 mg/dL — ABNORMAL LOW (ref 6–23)
Glucose, Bld: 74 mg/dL (ref 70–99)
Total Bilirubin: 1 mg/dL (ref 0.3–1.2)

## 2010-03-24 LAB — PROTIME-INR
INR: 3.3 (ref 2.00–3.50)
Protime: 39.6 Seconds — ABNORMAL HIGH (ref 10.6–13.4)

## 2010-03-24 LAB — CHCC SMEAR

## 2010-03-25 ENCOUNTER — Encounter: Payer: Self-pay | Admitting: Internal Medicine

## 2010-03-29 ENCOUNTER — Ambulatory Visit: Payer: Self-pay | Admitting: Internal Medicine

## 2010-03-29 LAB — CONVERTED CEMR LAB: HIV 1 RNA Quant: <48 copies/mL (ref <48)

## 2010-04-04 ENCOUNTER — Ambulatory Visit: Payer: Self-pay | Admitting: Internal Medicine

## 2010-04-04 LAB — CONVERTED CEMR LAB: INR: 2.5

## 2010-04-12 ENCOUNTER — Ambulatory Visit: Payer: Self-pay | Admitting: Internal Medicine

## 2010-04-12 DIAGNOSIS — N529 Male erectile dysfunction, unspecified: Secondary | ICD-10-CM

## 2010-04-19 ENCOUNTER — Encounter: Payer: Self-pay | Admitting: Internal Medicine

## 2010-04-21 ENCOUNTER — Telehealth: Payer: Self-pay | Admitting: Internal Medicine

## 2010-04-26 ENCOUNTER — Encounter: Payer: Self-pay | Admitting: Internal Medicine

## 2010-05-09 ENCOUNTER — Ambulatory Visit: Payer: Self-pay | Admitting: Internal Medicine

## 2010-05-27 ENCOUNTER — Telehealth: Payer: Self-pay | Admitting: Internal Medicine

## 2010-06-06 ENCOUNTER — Ambulatory Visit: Payer: Self-pay | Admitting: Internal Medicine

## 2010-06-06 LAB — CONVERTED CEMR LAB: INR: 4.6

## 2010-06-20 ENCOUNTER — Ambulatory Visit: Payer: Self-pay | Admitting: Internal Medicine

## 2010-06-24 ENCOUNTER — Telehealth (INDEPENDENT_AMBULATORY_CARE_PROVIDER_SITE_OTHER): Payer: Self-pay | Admitting: *Deleted

## 2010-06-30 ENCOUNTER — Encounter: Payer: Self-pay | Admitting: Internal Medicine

## 2010-07-11 ENCOUNTER — Ambulatory Visit: Payer: Self-pay | Admitting: Internal Medicine

## 2010-07-11 LAB — CONVERTED CEMR LAB: INR: 2.4

## 2010-08-08 ENCOUNTER — Ambulatory Visit: Payer: Self-pay | Admitting: Internal Medicine

## 2010-08-08 LAB — CONVERTED CEMR LAB: INR: 4.6

## 2010-08-29 ENCOUNTER — Ambulatory Visit: Payer: Self-pay | Admitting: Internal Medicine

## 2010-08-29 LAB — CONVERTED CEMR LAB

## 2010-09-15 ENCOUNTER — Encounter: Payer: Self-pay | Admitting: Internal Medicine

## 2010-09-21 ENCOUNTER — Ambulatory Visit: Payer: Self-pay | Admitting: Internal Medicine

## 2010-09-26 ENCOUNTER — Ambulatory Visit: Payer: Self-pay | Admitting: Internal Medicine

## 2010-09-27 ENCOUNTER — Encounter (INDEPENDENT_AMBULATORY_CARE_PROVIDER_SITE_OTHER): Payer: Self-pay | Admitting: *Deleted

## 2010-10-17 ENCOUNTER — Ambulatory Visit: Payer: Self-pay | Admitting: Internal Medicine

## 2010-11-07 ENCOUNTER — Ambulatory Visit: Admission: RE | Admit: 2010-11-07 | Discharge: 2010-11-07 | Payer: Self-pay | Source: Home / Self Care

## 2010-11-07 LAB — CONVERTED CEMR LAB: INR: 4.5

## 2010-11-15 ENCOUNTER — Ambulatory Visit
Admission: RE | Admit: 2010-11-15 | Discharge: 2010-11-15 | Payer: Self-pay | Source: Home / Self Care | Attending: Internal Medicine | Admitting: Internal Medicine

## 2010-11-15 ENCOUNTER — Encounter: Payer: Self-pay | Admitting: Internal Medicine

## 2010-11-15 LAB — CONVERTED CEMR LAB
ALT: 23 units/L (ref 0–53)
AST: 30 units/L (ref 0–37)
Basophils Absolute: 0 10*3/uL (ref 0.0–0.1)
Basophils Relative: 0 % (ref 0–1)
CO2: 27 meq/L (ref 19–32)
Chloride: 99 meq/L (ref 96–112)
Cholesterol: 270 mg/dL — ABNORMAL HIGH (ref 0–200)
Eosinophils Absolute: 0.1 10*3/uL (ref 0.0–0.7)
Eosinophils Relative: 1 % (ref 0–5)
HCT: 40.2 % (ref 39.0–52.0)
HIV 1 RNA Quant: <20 copies/mL (ref <20)
HIV-1 RNA Quant, Log: <1.3 (ref <1.30)
Lymphs Abs: 5.8 10*3/uL — ABNORMAL HIGH (ref 0.7–4.0)
MCV: 105.8 fL — ABNORMAL HIGH (ref 78.0–100.0)
Neutrophils Relative %: 29 % — ABNORMAL LOW (ref 43–77)
Platelets: 367 10*3/uL (ref 150–400)
RDW: 14.4 % (ref 11.5–15.5)
Sodium: 136 meq/L (ref 135–145)
Total Bilirubin: 0.5 mg/dL (ref 0.3–1.2)
Total Protein: 8 g/dL (ref 6.0–8.3)
Triglycerides: 569 mg/dL — ABNORMAL HIGH (ref <150)
WBC: 9.2 10*3/uL (ref 4.0–10.5)

## 2010-11-20 DIAGNOSIS — I749 Embolism and thrombosis of unspecified artery: Secondary | ICD-10-CM

## 2010-11-20 DIAGNOSIS — Z7901 Long term (current) use of anticoagulants: Secondary | ICD-10-CM | POA: Insufficient documentation

## 2010-11-21 ENCOUNTER — Ambulatory Visit: Admission: RE | Admit: 2010-11-21 | Discharge: 2010-11-21 | Payer: Self-pay | Source: Home / Self Care

## 2010-11-21 LAB — CONVERTED CEMR LAB: INR: 2.3

## 2010-11-29 ENCOUNTER — Ambulatory Visit
Admission: RE | Admit: 2010-11-29 | Discharge: 2010-11-29 | Payer: Self-pay | Source: Home / Self Care | Attending: Internal Medicine | Admitting: Internal Medicine

## 2010-11-29 NOTE — Miscellaneous (Signed)
Summary: Ensure rx  Clinical Lists Changes  Medications: Added new medication of ENSURE  LIQD (NUTRITIONAL SUPPLEMENTS) Drink one can by mouth 3 times a day - Signed Rx of ENSURE  LIQD (NUTRITIONAL SUPPLEMENTS) Drink one can by mouth 3 times a day;  #3 cases x prn;  Signed;  Entered by: Jennet Maduro RN;  Authorized by: [name];  Method used: Electronically to [facility] Dr. [phone]*, [address], [address], [place], [place]  [address], Ph: [phone], Fax: [phone] Rx of ENSURE  LIQD (NUTRITIONAL SUPPLEMENTS) Drink one can by mouth 3 times a day;  #3 cases x prn;  Signed;  Entered by: Jennet Maduro RN;  Authorized by: [name];  Method used: Print then Give to Patient    Prescriptions: ENSURE  LIQD (NUTRITIONAL SUPPLEMENTS) Drink one can by mouth 3 times a day  #3 cases x prn   Entered by:   Jennet Maduro RN   Authorized by:   [name]   Signed by:   Jennet Maduro RN on 06/30/2010   Method used:   Print then Give to Patient   RxID:   [id number] ENSURE  LIQD (NUTRITIONAL SUPPLEMENTS) Drink one can by mouth 3 times a day  #3 cases x prn   Entered by:   Jennet Maduro RN   Authorized by:   [name]   Signed by:   Jennet Maduro RN on 06/30/2010   Method used:   Electronically to        [facility] Dr. [phone]* (retail)       [address]       [address]       [place], [place]  [address]       Ph: [phone]       Fax: [phone]   RxID:   [id number]

## 2010-11-29 NOTE — Assessment & Plan Note (Addendum)
Summary: FLU SHOT  Prior Medications: ZERIT 40 MG CAPS (STAVUDINE) Take 1 capsule by mouth two times a day [name] 300 MG TABS (TENOFOVIR DISOPROXIL FUMARATE) Take 1 tablet by mouth once a day KALETRA 200-50 MG TABS (LOPINAVIR-RITONAVIR) Take 2 tablet by mouth twice daily NEURONTIN 400 MG CAPS (GABAPENTIN) Take 1 capsule by mouth four times a day MS CONTIN 60 MG XR12H-TAB (MORPHINE SULFATE) Take 1 tablet by mouth two times a day ASPIRIN 81 MG TBEC (ASPIRIN) Take 1 tablet by mouth once a day COUMADIN 5 MG TABS (WARFARIN SODIUM) Please take 3 tablets today and the next few days until you are seen in the Coumadin clinic or with Dr. Orvan Falconer in ID clinic. ANDROGEL 50 MG/5GM GEL (TESTOSTERONE) Please apply one 5GM packet to skin of upper chest or shoulders daily. OXYCODONE HCL 5 MG TABS (OXYCODONE HCL) Take 1 tablet by mouth two times a day ENSURE  LIQD (NUTRITIONAL SUPPLEMENTS) Drink one can by mouth 3 times a day Current Allergies: ! SEPTRA ! DAPSONE (DAPSONE)  Appended Document: FLU SHOT    Clinical Lists Changes  Observations: Added new observation of FLU VAX: Historical (09/21/2010 9:10)       Influenza Immunization History:    Influenza # 1:  Historical (09/21/2010)

## 2010-11-29 NOTE — Assessment & Plan Note (Signed)
Summary: COU/VS  Anticoagulant Therapy Managed by: [name]. Janie Morning  PharmD CACP PCP: [name] [facility] Attending: [name] Indication 1: Deep vein thrombus Indication 2: Encounter for therapeutic drug monitoring  V58.83  Patient Assessment Reviewed by: [name] PharmD  February 07, 2010 Medication review: verified warfarin dosage & schedule,verified previous prescription medications, verified doses & any changes, verified new medications, reviewed OTC medications, reviewed OTC health products-vitamins supplements etc Complications: none Dietary changes: none   Health status changes: none   Lifestyle changes: none   Recent/future hospitalizations: none   Recent/future procedures: none   Recent/future dental: none Patient Assessment Part 2:  Have you MISSED ANY DOSES or CHANGED TABLETS?  No missed Warfarin doses or changed tablets.  Have you had any BRUISING or BLEEDING ( nose or gum bleeds,blood in urine or stool)?  No reported bruising or bleeding in nose, gums, urine, stool.  Have you STARTED or STOPPED any MEDICATIONS, including OTC meds,herbals or supplements?  No other medications or herbal supplements were started or stopped.  Have you CHANGED your DIET, especially green vegetables,or ALCOHOL intake?  No changes in diet or alcohol intake.  Have you had any ILLNESSES or HOSPITALIZATIONS?  No reported illnesses or hospitalizations  Have you had any signs of CLOTTING?(chest discomfort,dizziness,shortness of breath,arms tingling,slurred speech,swelling or redness in leg)    No chest discomfort, dizziness, shortness of breath, tingling in arm, slurred speech, swelling, or redness in leg.     Treatment  Target INR: 2.0-3.0 INR: 2.1  Date: 02/07/2010 Regimen In:  97.5mg /week INR reflects regimen in: 2.1  New  Tablet strength: : 5mg  Regimen Out:     Sunday: 3 Tablet     Monday: 2 & 1/2 Tablet     Tuesday: 3 Tablet     Wednesday: 3 Tablet     Thursday:  2 & 1/2 Tablet      Friday: 3 Tablet     Saturday: 3 Tablet Total Weekly: 100.0mg/week mg  Next INR Due: 03/07/2010 Adjusted by: Aquiles Ruffini B. Khole Arterburn [name] PharmD CACP   Return to anticoagulation clinic:  03/07/2010 Time of next visit: 1430    Allergies: 1)  ! Septra 2)  ! Dapsone (Dapsone) Prescriptions: COUMADIN 5 MG TABS (WARFARIN SODIUM) Please take 3 tablets today and the next few days until you are seen in the Coumadin clinic or with Dr. Campbell in ID clinic.  #90 x 3   Entered by:   Jay Revel Stellmach PharmD   Authorized by:   Stewart Rogers MD   Signed by:   Jay Blaize Nipper PharmD on 02/07/2010   Method used:   Electronically to        [facility] [address] [id number]* (retail)       [address]       [address]       [place], [place]  [address]       Ph: [phone]       Fax: [phone]   RxID:   [id number]

## 2010-11-29 NOTE — Assessment & Plan Note (Signed)
Summary: COU/VS  Anticoagulant Therapy Managed by: [name]. Janie Morning  PharmD CACP PCP: [name] [facility] Attending: Margarito Liner MD Indication 1: Deep vein thrombus Indication 2: Encounter for therapeutic drug monitoring  V58.83  Patient Assessment Reviewed by: [name] PharmD  December 06, 2009 Medication review: verified warfarin dosage & schedule,verified previous prescription medications, verified doses & any changes, verified new medications, reviewed OTC medications, reviewed OTC health products-vitamins supplements etc Complications: none Dietary changes: none   Health status changes: none   Lifestyle changes: none   Recent/future hospitalizations: none   Recent/future procedures: none   Recent/future dental: none Patient Assessment Part 2:  Have you MISSED ANY DOSES or CHANGED TABLETS?  No missed Warfarin doses or changed tablets.  Have you had any BRUISING or BLEEDING ( nose or gum bleeds,blood in urine or stool)?  No reported bruising or bleeding in nose, gums, urine, stool.  Have you STARTED or STOPPED any MEDICATIONS, including OTC meds,herbals or supplements?  No other medications or herbal supplements were started or stopped.  Have you CHANGED your DIET, especially green vegetables,or ALCOHOL intake?  No changes in diet or alcohol intake.  Have you had any ILLNESSES or HOSPITALIZATIONS?  No reported illnesses or hospitalizations  Have you had any signs of CLOTTING?(chest discomfort,dizziness,shortness of breath,arms tingling,slurred speech,swelling or redness in leg)    No chest discomfort, dizziness, shortness of breath, tingling in arm, slurred speech, swelling, or redness in leg.     Treatment  Target INR: 2.0-3.0 INR: 2.7  Date: 12/06/2009 Regimen In:  97.5mg /week INR reflects regimen in: 2.7  New  Tablet strength: : 5mg  Regimen Out:     Sunday: 2 & 1/2 Tablet     Monday: 3 Tablet     Tuesday: 2 & 1/2 Tablet     Wednesday: 3 Tablet  Thursday: 2 & 1/2 Tablet      Friday: 3 Tablet     Saturday: 2 & 1/2 Tablet Total Weekly: 95.0mg /week mg  Next INR Due: 01/03/2010 Adjusted by: [name]. [name] PharmD CACP   Return to anticoagulation clinic:  01/03/2010 Time of next visit: 1515    Allergies: 1)  ! Septra 2)  ! Dapsone (Dapsone)

## 2010-11-29 NOTE — Assessment & Plan Note (Signed)
Summary: COU/CH  Anticoagulant Therapy Managed by: [name]. [name]  PharmD CACP PCP: [name] [facility] Attending: [name] Indication 1: Deep vein thrombus Indication 2: Encounter for therapeutic drug monitoring  V58.83  Patient Assessment Reviewed by: [name] PharmD  May 09, 2010 Medication review: verified warfarin dosage & schedule,verified previous prescription medications, verified doses & any changes, verified new medications, reviewed OTC medications, reviewed OTC health products-vitamins supplements etc Complications: none Dietary changes: none   Health status changes: none   Lifestyle changes: none   Recent/future hospitalizations: none   Recent/future procedures: none   Recent/future dental: none Patient Assessment Part 2:  Have you MISSED ANY DOSES or CHANGED TABLETS?  No missed Warfarin doses or changed tablets.  Have you had any BRUISING or BLEEDING ( nose or gum bleeds,blood in urine or stool)?  No reported bruising or bleeding in nose, gums, urine, stool.  Have you STARTED or STOPPED any MEDICATIONS, including OTC meds,herbals or supplements?  No other medications or herbal supplements were started or stopped.  Have you CHANGED your DIET, especially green vegetables,or ALCOHOL intake?  No changes in diet or alcohol intake.  Have you had any ILLNESSES or HOSPITALIZATIONS?  No reported illnesses or hospitalizations  Have you had any signs of CLOTTING?(chest discomfort,dizziness,shortness of breath,arms tingling,slurred speech,swelling or redness in leg)    No chest discomfort, dizziness, shortness of breath, tingling in arm, slurred speech, swelling, or redness in leg.     Treatment  Target INR: 2.0-3.0 INR: 2.3  Date: 05/09/2010 Regimen In:  100mg /wk INR reflects regimen in: 2.3  New  Tablet strength: : 5mg  Regimen Out:     Sunday: 2 Tablet     Monday: 3 Tablet     Tuesday: 3 Tablet     Wednesday: 3 Tablet     Thursday: 3 Tablet      Friday: 3 Tablet     Saturday: 3 Tablet Total Weekly: 100.0mg /week mg  Next INR Due: 06/06/2010 Adjusted by: [name]. [name] PharmD CACP   Return to anticoagulation clinic:  06/06/2010 Time of next visit: 1430    Allergies: 1)  ! Septra 2)  ! Dapsone (Dapsone)

## 2010-11-29 NOTE — Progress Notes (Signed)
Summary: Questions about 2 rxes  Phone Note From Pharmacy   Caller: [facility] Dr. [phone]* Call For: Dr. Orvan Falconer  Summary of Call: Protonix no longer on pt's insurance.  What would you like to switch to?  Also,  pt's Vit D blood level now "normal"  what would you like to do about the Vit D rx?  Please advise.  Jennet Maduro RN  November 04, 2009 9:09 AM   Follow-up for Phone Call        He can take Prilosec OTC. If he cannot afford this, I need to know which PPIs his insurance will cover. His vit D level is normal because he is on supplemnetal vit D so ask him to continue it. Follow-up by: [name],  November 05, 2009 5:16 PM

## 2010-11-29 NOTE — Assessment & Plan Note (Signed)
Summary: F/U/VS   Primary Provider:  [name]  CC:  follow-up visit and would like to start back on Megace.  History of Present Illness: [name] is in for his routine visit.  He says that he is doing much better over the last several months and feels like he is almost back to where he was before he got sick last year.  He is working hard with physical therapy and was able to walk unassisted with his right leg prosthesis last week.  He is having much less problem with pain and has been able to taper off of some of his pain medication.  His appetite is good and he stopped taking Megace.  He is not having any further problems with acid indigestion, hiccups, nausea, or constipation.  He says that he stopped taking his vitamin D several months ago when his prescription ran out.  He denies missing any doses of his HIV medications.  Preventive Screening-Counseling & Management  Alcohol-Tobacco     Alcohol drinks/day: 0     Smoking Status: current     Smoking Cessation Counseling: yes     Packs/Day: 1.0     Cans of tobacco/week: no     Passive Smoke Exposure: yes  Caffeine-Diet-Exercise     Caffeine use/day: yes     Does Patient Exercise: yes, PT     Exercise (avg: min/session): >60     Times/week: <3  Hep-HIV-STD-Contraception     HIV Risk: no risk noted     HIV Risk Counseling: not indicated-no HIV risk noted  Safety-Violence-Falls     Seat Belt Use: yes  Comments: declined condoms      Sexual History:  currently monogamous.        Drug Use:  former.     Updated Prior Medication List: ZERIT 40 MG CAPS (STAVUDINE) Take 1 capsule by mouth two times a day [name] 300 MG TABS (TENOFOVIR DISOPROXIL FUMARATE) Take 1 tablet by mouth once a day KALETRA 200-50 MG TABS (LOPINAVIR-RITONAVIR) Take 2 tablet by mouth twice daily NEURONTIN 400 MG CAPS (GABAPENTIN) Take 1 tablet by mouth three times a day OXYCODONE HCL 5 MG TABS (OXYCODONE HCL) Take 1-2 tablets by mouth every 6  hours as needed for pain MS CONTIN 30 MG XR12H-TAB (MORPHINE SULFATE) Take 1 tablet by mouth two times a day MS CONTIN 15 MG XR12H-TAB (MORPHINE SULFATE) Take 1 tablet by mouth two times a day ASPIRIN 81 MG TBEC (ASPIRIN) Take 1 tablet by mouth once a day COUMADIN 5 MG TABS (WARFARIN SODIUM) Please take 3 tablets today and the next few days until you are seen in the Coumadin clinic or with Dr. Orvan Falconer in ID clinic. ANDROGEL 50 MG/5GM GEL (TESTOSTERONE) Please apply one 5GM packet to skin of upper chest or shoulders daily. ENSURE  LIQD (NUTRITIONAL SUPPLEMENTS) Please take one can 3 times dailyas a nutrition supplement.  Current Allergies (reviewed today): ! SEPTRA ! DAPSONE (DAPSONE) Vital Signs:  Patient profile:   50 year old male Height:      72 inches (182.88 cm) Weight:      142.4 pounds (64.73 kg) BMI:     19.38 Temp:     97.8 degrees F (36.56 degrees C) oral Pulse rate:   86 / minute BP sitting:   137 / 72  (left arm)  Vitals Entered By: Jennet Maduro RN (December 28, 2009 9:47 AM) CC: follow-up visit, would like to start back on Megace Is Patient Diabetic? No Pain  Assessment Patient in pain? no      Nutritional Status BMI of 19 -24 = normal Nutritional Status Detail appetite "little slow"  Have you ever been in a relationship where you felt threatened, hurt or afraid?not asked  today   Does patient need assistance? Functional Status Self care Ambulation Impaired:Risk for fall Comments no missed odses ofrxes   Physical Exam  General:  alert and well-nourished.   Mouth:  pharynx pink and moist, no erythema, and no exudates.   Lungs:  normal respiratory effort, normal breath sounds, no crackles, and no wheezes.   Heart:  normal rate, regular rhythm, and no murmur.   Abdomen:  soft, non-tender, and normal bowel sounds.   Skin:  no rashes.   Psych:  normally interactive, good eye contact, not anxious appearing, and not depressed appearing.          Medication  Adherence: 12/28/2009   Adherence to medications reviewed with patient. Counseling to provide adequate adherence provided   Prevention For Positives: 12/28/2009   Safe sex practices discussed with patient. Condoms offered.                             Impression & Recommendations:  Problem # 1:  HIV DISEASE (ICD-042) Veto's HIV infection remains under reasonably good control with his current regimen.  I will not make any changes today. Diagnostics Reviewed:  HIV: CDC-defined AIDS (07/20/2009)   CD4: 1330 (12/15/2009)   WBC: 9.1 (12/14/2009)   Hgb: 13.7 (12/14/2009)   HCT: 40.1 (12/14/2009)   Platelets: 356 (12/14/2009) HIV-1 RNA: 346 (12/14/2009)   HBSAg: NO (12/24/2006)  Problem # 2:  GERD (ICD-530.81) His acid reflux and hiccups have stopped so I will have him stay off of the proton pump inhibitor at this time. The following medications were removed from the medication list:    Protonix 40 Mg Tbec (Pantoprazole sodium) .Marland Kitchen... Take 1 tablet by mouth two times a day    Tums Smoothies 750 Mg Chew (Calcium carbonate antacid) .Marland Kitchen... Take 1 tablet by mouth four times a day as needed for indigestion  Orders: Est. Patient Level IV (36644)  Problem # 3:  PHANTOM LIMB SYNDROME (ICD-353.6) His phantom limb pain is under much better control thanks to help with the pain clinic.  He is now tapering off of his pain medications with the help of a TENS unit.  Problem # 4:  DEFICIENCY OF OTHER VITAMINS (ICD-269.1) His vitamin D level was normal when done recently off of supplements.  Therefore I will not restart his vitamin D and will simply monitor levels over the next 12 months. Orders: Est. Patient Level IV (03474)  Medications Added to Medication List This Visit: 1)  Neurontin 400 Mg Caps (Gabapentin) .... Take 1 tablet by mouth three times a day  Other Orders: Future Orders: T-CD4SP ([facility]) (CD4SP) ... 03/28/2010 T-HIV Viral Load [phone]) ... 03/28/2010  Patient  Instructions: 1)  Please schedule a follow-up appointment in 3 months.  Process Orders Check Orders Results:     [facility]: Check successful Tests Sent for requisitioning (December 28, 2009 10:06 AM):     03/28/2010: [facility] -- T-HIV Viral Load [phone] (signed)

## 2010-11-29 NOTE — Progress Notes (Signed)
Summary: request for narcotics  Phone Note Call from Patient   Caller: Patient Summary of Call: Patient requesting pain medication last p/u on 05/27/2010 Initial call taken by: Starleen Arms CMA,  June 24, 2010 9:30 AM  Follow-up for Phone Call        ok x1 Follow-up by: Yisroel Ramming MD,  June 24, 2010 2:35 PM    Prescriptions: OXYCODONE HCL 5 MG TABS (OXYCODONE HCL) Take 1 tablet by mouth two times a day  #60 x 0   Entered by:   Starleen Arms CMA   Authorized by:   Yisroel Ramming MD   Signed by:   Wendall Mola CMA ( AAMA) on 06/24/2010   Method used:   Print then Give to Patient   RxID:   [id number] MS CONTIN 60 MG XR12H-TAB (MORPHINE SULFATE) Take 1 tablet by mouth two times a day  #60 x 0   Entered by:   Starleen Arms CMA   Authorized by:   Yisroel Ramming MD   Signed by:   Wendall Mola CMA ( AAMA) on 06/24/2010   Method used:   Print then Give to Patient   RxID:   [id number]

## 2010-11-29 NOTE — Assessment & Plan Note (Signed)
Summary: COU/CH  Anticoagulant Therapy Managed by: [name]. Janie Morning  PharmD CACP PCP: [name] [facility] Attending: Onalee Hua MD, Manrique Indication 1: Deep vein thrombus Indication 2: Encounter for therapeutic drug monitoring  V58.83  Patient Assessment Reviewed by: [name] PharmD  August 08, 2010 Medication review: verified warfarin dosage & schedule,verified previous prescription medications, verified doses & any changes, verified new medications, reviewed OTC medications, reviewed OTC health products-vitamins supplements etc Complications: none Dietary changes: none   Health status changes: none   Lifestyle changes: none   Recent/future hospitalizations: none   Recent/future procedures: none   Recent/future dental: none Patient Assessment Part 2:  Have you MISSED ANY DOSES or CHANGED TABLETS?  No missed Warfarin doses or changed tablets.  Have you had any BRUISING or BLEEDING ( nose or gum bleeds,blood in urine or stool)?  No reported bruising or bleeding in nose, gums, urine, stool.  Have you STARTED or STOPPED any MEDICATIONS, including OTC meds,herbals or supplements?  No other medications or herbal supplements were started or stopped.  Have you CHANGED your DIET, especially green vegetables,or ALCOHOL intake?  No changes in diet or alcohol intake.  Have you had any ILLNESSES or HOSPITALIZATIONS?  No reported illnesses or hospitalizations  Have you had any signs of CLOTTING?(chest discomfort,dizziness,shortness of breath,arms tingling,slurred speech,swelling or redness in leg)    No chest discomfort, dizziness, shortness of breath, tingling in arm, slurred speech, swelling, or redness in leg.     Treatment  Target INR: 2.0-3.0 INR: 4.6  Date: 08/08/2010 Regimen In:  80.0mg /week INR reflects regimen in: 4.6  New  Tablet strength: : 5mg  Regimen Out:     Sunday: 2 Tablet     Monday: 2 Tablet     Tuesday: 2 Tablet     Wednesday: 2 & 1/2 Tablet  Thursday: 2 Tablet      Friday: 2 Tablet     Saturday: 2 Tablet Total Weekly: 72.5mg /week mg  Next INR Due: 08/29/2010 Adjusted by: [name]. [name] PharmD CACP   Return to anticoagulation clinic:  08/29/2010 Time of next visit: 1500    Allergies: 1)  ! Septra 2)  ! Dapsone (Dapsone)  Appended Document: COU/CH Will OMIT/HOLD TODAYS DOSE.

## 2010-11-29 NOTE — Assessment & Plan Note (Signed)
Summary: COU/CH  Anticoagulant Therapy Managed by: [name]. Janie Morning  PharmD CACP PCP: [name] [facility] Attending: Rogelia Boga MD, Lanora Manis Indication 1: Deep vein thrombus Indication 2: Encounter for therapeutic drug monitoring  V58.83  Patient Assessment Reviewed by: [name] PharmD  June 20, 2010 Medication review: verified warfarin dosage & schedule,verified previous prescription medications, verified doses & any changes, verified new medications, reviewed OTC medications, reviewed OTC health products-vitamins supplements etc Complications: none Dietary changes: none   Health status changes: none   Lifestyle changes: none   Recent/future hospitalizations: none   Recent/future procedures: none   Recent/future dental: none Patient Assessment Part 2:  Have you MISSED ANY DOSES or CHANGED TABLETS?  No missed Warfarin doses or changed tablets.  Have you had any BRUISING or BLEEDING ( nose or gum bleeds,blood in urine or stool)?  No reported bruising or bleeding in nose, gums, urine, stool.  Have you STARTED or STOPPED any MEDICATIONS, including OTC meds,herbals or supplements?  No other medications or herbal supplements were started or stopped.  Have you CHANGED your DIET, especially green vegetables,or ALCOHOL intake?  No changes in diet or alcohol intake.  Have you had any ILLNESSES or HOSPITALIZATIONS?  No reported illnesses or hospitalizations  Have you had any signs of CLOTTING?(chest discomfort,dizziness,shortness of breath,arms tingling,slurred speech,swelling or redness in leg)    No chest discomfort, dizziness, shortness of breath, tingling in arm, slurred speech, swelling, or redness in leg.     Treatment  Target INR: 2.0-3.0 INR: 4.2  Date: 06/20/2010 Regimen In:  87.5mg /week INR reflects regimen in: 4.2  New  Tablet strength: : 5mg  Regimen Out:     Sunday: 2 & 1/2 Tablet     Monday: 2 Tablet     Tuesday: 2 & 1/2 Tablet     Wednesday: 2 Tablet  Thursday: 2 & 1/2 Tablet      Friday: 2 Tablet     Saturday: 2 & 1/2 Tablet Total Weekly: 80.0mg/week mg  Next INR Due: 07/11/2010 Adjusted by: James B. Groce [name] PharmD CACP   Return to anticoagulation clinic:  07/11/2010 Time of next visit: 1500    Allergies: 1)  ! Septra 2)  ! Dapsone (Dapsone) Prescriptions: COUMADIN 5 MG TABS (WARFARIN SODIUM) Please take 3 tablets today and the next few days until you are seen in the Coumadin clinic or with Dr. Campbell in ID clinic.  #100 x 2   Entered by:   Jay Groce PharmD   Authorized by:   Elizabeth Butcher MD   Signed by:   Jay Groce PharmD on 06/20/2010   Method used:   Electronically to        [facility] [address] [id number]* (retail)       [address]       [address]       [place], [place]  [address]       Ph: [phone]       Fax: [phone]   RxID:   [id number]   Appended Document: COU/CH Call from pharmacy with question on the  of coumadin orderI talked with Dr Rogelia Boga and she changed directions to "take as directed".  pharmacist informed.  Appended Document: COU/CH I reviewed Mr Pittmans's need for life long anticoagulation. Pt had R lower arterial occlusion s/p R AKA. Per D/C summary,appeared to be chronic PAD and not an acute arterial blood clot. Pt had hpercoag panel at admit and had a + lupus anticoag. Dr Donnie Coffin saw pt and  initiall said that no definitive conclusions could be drawn and pt to be treated with ASA and Plavix. Opinion was later changed to ASA and coumadin - no reason was given in D/C summary. Dr Donnie Coffin saw pt in out pt F/U and said possible hypercoagulopathy and said no need to F?U with him as coumadin was being managed by Dr Alexandria Lodge. Pt last saw Dr Doreen Beam last year as a hospital F?U but no assigned primary care MD. Will ask Dr Orvan Falconer if he agrees to lifelong coumadin or wants Dr Donnie Coffin to comment again on necessity or schedule in IM.

## 2010-11-29 NOTE — Assessment & Plan Note (Signed)
Summary: COU/VS  Anticoagulant Therapy Managed by: [name]. Janie Morning  PharmD CACP PCP: [name] [facility] Attending: Margarito Liner MD Indication 1: Deep vein thrombus Indication 2: Encounter for therapeutic drug monitoring  V58.83  Patient Assessment Reviewed by: [name] PharmD  January 03, 2010 Medication review: verified warfarin dosage & schedule,verified previous prescription medications, verified doses & any changes, verified new medications, reviewed OTC medications, reviewed OTC health products-vitamins supplements etc Complications: none Dietary changes: none   Health status changes: none   Lifestyle changes: none   Recent/future hospitalizations: none   Recent/future procedures: none   Recent/future dental: none Patient Assessment Part 2:  Have you MISSED ANY DOSES or CHANGED TABLETS?  No missed Warfarin doses or changed tablets.  Have you had any BRUISING or BLEEDING ( nose or gum bleeds,blood in urine or stool)?  No reported bruising or bleeding in nose, gums, urine, stool.  Have you STARTED or STOPPED any MEDICATIONS, including OTC meds,herbals or supplements?  No other medications or herbal supplements were started or stopped.  Have you CHANGED your DIET, especially green vegetables,or ALCOHOL intake?  No changes in diet or alcohol intake.  Have you had any ILLNESSES or HOSPITALIZATIONS?  No reported illnesses or hospitalizations  Have you had any signs of CLOTTING?(chest discomfort,dizziness,shortness of breath,arms tingling,slurred speech,swelling or redness in leg)    No chest discomfort, dizziness, shortness of breath, tingling in arm, slurred speech, swelling, or redness in leg.     Treatment  Target INR: 2.0-3.0 INR: 2.4  Date: 01/03/2010 Regimen In:  95.0mg /week INR reflects regimen in: 2.4  New  Tablet strength: : 5mg  Regimen Out:     Sunday: 3 Tablet     Monday: 2 & 1/2 Tablet     Tuesday: 3 Tablet     Wednesday: 2 & 1/2 Tablet  Thursday: 3 Tablet      Friday: 2 & 1/2 Tablet     Saturday: 3 Tablet Total Weekly: 97.5mg /week mg  Next INR Due: 01/31/2010 Adjusted by: [name]. [name] PharmD CACP   Return to anticoagulation clinic:  01/31/2010 Time of next visit: 1615    Allergies: 1)  ! Septra 2)  ! Dapsone (Dapsone)

## 2010-11-29 NOTE — Progress Notes (Signed)
Summary: request for pain medication/TY  Phone Note Call from Patient   Caller: Patient Call For: [name] Reason for Call: Refill Medication Summary of Call: Patient called stating that he is out of pain medicine, and would like a refill. Checking with Dr. Orvan Falconer to see if he can have rx for pain meds.  Initial call taken by: Starleen Arms CMA,  November 08, 2009 12:16 PM  Follow-up for Phone Call        Refills provided x 1 until he is seen at the pain clinic later this month. Follow-up by: [name],  November 09, 2009 4:25 PM

## 2010-11-29 NOTE — Miscellaneous (Signed)
Summary: [employer] [facility] [employer]   Imported By: Florinda Marker 12/22/2009 14:18:35  _____________________________________________________________________  External Attachment:    Type:   Image     Comment:   External Document

## 2010-11-29 NOTE — Assessment & Plan Note (Addendum)
Summary: 261/ds  Anticoagulant Therapy Managed by: [name]. Janie Morning  PharmD CACP PCP: [name] [facility] Attending: Margarito Liner MD Indication 1: Deep vein thrombus Indication 2: Encounter for therapeutic drug monitoring  V58.83  Patient Assessment Reviewed by: [name] PharmD  July 11, 2010 Medication review: verified warfarin dosage & schedule,verified previous prescription medications, verified doses & any changes, verified new medications, reviewed OTC medications, reviewed OTC health products-vitamins supplements etc Complications: none Dietary changes: none   Health status changes: none   Lifestyle changes: none   Recent/future hospitalizations: none   Recent/future procedures: none   Recent/future dental: none Patient Assessment Part 2:  Have you MISSED ANY DOSES or CHANGED TABLETS?  No missed Warfarin doses or changed tablets.  Have you had any BRUISING or BLEEDING ( nose or gum bleeds,blood in urine or stool)?  No reported bruising or bleeding in nose, gums, urine, stool.  Have you STARTED or STOPPED any MEDICATIONS, including OTC meds,herbals or supplements?  No other medications or herbal supplements were started or stopped.  Have you CHANGED your DIET, especially green vegetables,or ALCOHOL intake?  No changes in diet or alcohol intake.  Have you had any ILLNESSES or HOSPITALIZATIONS?  No reported illnesses or hospitalizations  Have you had any signs of CLOTTING?(chest discomfort,dizziness,shortness of breath,arms tingling,slurred speech,swelling or redness in leg)    No chest discomfort, dizziness, shortness of breath, tingling in arm, slurred speech, swelling, or redness in leg.     Treatment  Target INR: 2.0-3.0 INR: 2.4  Date: 07/11/2010 Regimen In:  80.0mg /week INR reflects regimen in: 2.4  New  Tablet strength: : 5mg  Regimen Out:     Sunday: 2 & 1/2 Tablet     Monday: 2 Tablet     Tuesday: 2 & 1/2 Tablet     Wednesday: 2 Tablet  Thursday: 2 & 1/2 Tablet      Friday: 2 Tablet     Saturday: 2 & 1/2 Tablet Total Weekly: 80.0mg /week mg  Next INR Due: 08/08/2010 Adjusted by: [name]. [name] PharmD CACP   Return to anticoagulation clinic:  08/08/2010 Time of next visit: 1445    Allergies: 1)  ! Septra 2)  ! Dapsone (Dapsone)

## 2010-11-29 NOTE — Miscellaneous (Signed)
Summary: Orders Update - labs  Clinical Lists Changes  Orders: Added new Test order of T-CBC w/Diff [phone]) - Signed Added new Test order of T-CD4SP [facility]) (CD4SP) - Signed Added new Test order of T-Comprehensive Metabolic Panel [phone]) - Signed Added new Test order of T-HIV Viral Load [phone]) - Signed Added new Test order of T-Testosterone; Total [phone]) - Signed Added new Test order of T- * Misc. Laboratory test [phone]) - Signed Added new Test order of T-Vitamin D (25-Hydroxy) [phone]) - Signed     Process Orders Check Orders Results:     [facility]: Check successful Tests Sent for requisitioning (November 03, 2009 12:48 PM):     12/16/2009: [facility] -- T-CBC w/Diff [[id number]] (signed)     12/16/2009: [facility] -- T-Comprehensive Metabolic Panel [[id number]] (signed)     12/16/2009: [facility] -- T-HIV Viral Load [phone] (signed)     12/16/2009: [facility] -- T-Testosterone; Total [phone] (signed)     12/09/2009: [facility] -- T-Vitamin D (25-Hydroxy) [phone] (signed)

## 2010-11-29 NOTE — Consult Note (Signed)
Summary: Dr. Wynn Banker  Dr. Wynn Banker   Imported By: Florinda Marker 02/17/2010 14:29:34  _____________________________________________________________________  External Attachment:    Type:   Image     Comment:   External Document

## 2010-11-29 NOTE — Assessment & Plan Note (Signed)
Summary: COU/SB.  Anticoagulant Therapy Managed by: [name]. Janie Morning  PharmD CACP PCP: [name] [facility] Attending: Josem Kaufmann MD, Lawrence Indication 1: Deep vein thrombus Indication 2: Encounter for therapeutic drug monitoring  V58.83  Patient Assessment Reviewed by: [name] PharmD  November 08, 2009 Medication review: verified warfarin dosage & schedule,verified previous prescription medications, verified doses & any changes, verified new medications, reviewed OTC medications, reviewed OTC health products-vitamins supplements etc Complications: none Dietary changes: none   Health status changes: none   Lifestyle changes: none   Recent/future hospitalizations: none   Recent/future procedures: none   Recent/future dental: none Patient Assessment Part 2:  Have you MISSED ANY DOSES or CHANGED TABLETS?  No missed Warfarin doses or changed tablets.  Have you had any BRUISING or BLEEDING ( nose or gum bleeds,blood in urine or stool)?  No reported bruising or bleeding in nose, gums, urine, stool.  Have you STARTED or STOPPED any MEDICATIONS, including OTC meds,herbals or supplements?  No other medications or herbal supplements were started or stopped.  Have you CHANGED your DIET, especially green vegetables,or ALCOHOL intake?  No changes in diet or alcohol intake.  Have you had any ILLNESSES or HOSPITALIZATIONS?  No reported illnesses or hospitalizations  Have you had any signs of CLOTTING?(chest discomfort,dizziness,shortness of breath,arms tingling,slurred speech,swelling or redness in leg)    No chest discomfort, dizziness, shortness of breath, tingling in arm, slurred speech, swelling, or redness in leg.     Treatment  Target INR: 2.0-3.0 INR: 1.4  Date: 11/08/2009 Regimen In:  77.5mg /week INR reflects regimen in: 1.4  New  Tablet strength: : 5mg  Regimen Out:     Sunday: 2 & 1/2 Tablet     Monday: 2 & 1/2 Tablet     Tuesday: 2 & 1/2 Tablet     Wednesday: 2  Tablet     Thursday: 2  & 1/2 Tablet      Friday: 2  & 1/2 Tablet     Saturday: 2  & 1/2 Tablet Total Weekly: 85.0mg /week mg  Next INR Due: 11/22/2009 Adjusted by: [name]. [name] PharmD CACP   Return to anticoagulation clinic:  11/22/2009 Time of next visit: 1445   Comments: Patient counseled/cautioned regarding falls avoidance with impending inclement/icy conditions expected within next 1-2 days.  Allergies: 1)  ! Septra 2)  ! Dapsone (Dapsone)

## 2010-11-29 NOTE — Miscellaneous (Signed)
Summary: [facility]: Verbal Orders  [facility]: Verbal Orders   Imported By: Florinda Marker 04/27/2010 16:26:08  _____________________________________________________________________  External Attachment:    Type:   Image     Comment:   External Document

## 2010-11-29 NOTE — Consult Note (Signed)
Summary: [facility]   Imported By: Florinda Marker 04/20/2010 14:50:44  _____________________________________________________________________  External Attachment:    Type:   Image     Comment:   External Document

## 2010-11-29 NOTE — Assessment & Plan Note (Signed)
Summary: COU/CH  Anticoagulant Therapy Managed by: [name]. [name]  PharmD CACP PCP: [name] [facility] Attending: Josem Kaufmann MD, Lawrence Indication 1: Deep vein thrombus Indication 2: Encounter for therapeutic drug monitoring  V58.83  Patient Assessment Reviewed by: [name] PharmD  June 06, 2010 Medication review: verified warfarin dosage & schedule,verified previous prescription medications, verified doses & any changes, verified new medications, reviewed OTC medications, reviewed OTC health products-vitamins supplements etc Complications: none Dietary changes: none   Health status changes: none   Lifestyle changes: none   Recent/future hospitalizations: none   Recent/future procedures: none   Recent/future dental: none Patient Assessment Part 2:  Have you MISSED ANY DOSES or CHANGED TABLETS?  No missed Warfarin doses or changed tablets.  Have you had any BRUISING or BLEEDING ( nose or gum bleeds,blood in urine or stool)?  No reported bruising or bleeding in nose, gums, urine, stool.  Have you STARTED or STOPPED any MEDICATIONS, including OTC meds,herbals or supplements?  No other medications or herbal supplements were started or stopped.  Have you CHANGED your DIET, especially green vegetables,or ALCOHOL intake?  No changes in diet or alcohol intake.  Have you had any ILLNESSES or HOSPITALIZATIONS?  No reported illnesses or hospitalizations  Have you had any signs of CLOTTING?(chest discomfort,dizziness,shortness of breath,arms tingling,slurred speech,swelling or redness in leg)    No chest discomfort, dizziness, shortness of breath, tingling in arm, slurred speech, swelling, or redness in leg.     Treatment  Target INR: 2.0-3.0 INR: 4.6  Date: 06/06/2010 Regimen In:  100.0mg /week INR reflects regimen in: 4.6  New  Tablet strength: : 5mg  Regimen Out:     Sunday: 2 & 1/2 Tablet     Monday: 2 & 1/2 Tablet     Tuesday: 2 & 1/2 Tablet     Wednesday: 2 & 1/2  Tablet     Thursday: 2 & 1/2 Tablet      Friday: 2 & 1/2 Tablet     Saturday: 2 & 1/2 Tablet Total Weekly: 87.5mg /week mg  Next INR Due: 06/20/2010 Adjusted by: [name]. [name] PharmD CACP   Return to anticoagulation clinic:  06/20/2010 Time of next visit: 1445  Hold:  1 Days     Allergies: 1)  ! Septra 2)  ! Dapsone (Dapsone)  Appended Document: COU/CH Arterial thrombus.

## 2010-11-29 NOTE — Miscellaneous (Signed)
Summary: [facility]: Orders  [facility]: Orders   Imported By: Florinda Marker 09/20/2010 13:59:18  _____________________________________________________________________  External Attachment:    Type:   Image     Comment:   External Document

## 2010-11-29 NOTE — Assessment & Plan Note (Signed)
Summary: COU/CH  Anticoagulant Therapy Managed by: [name]. Janie Morning  PharmD CACP PCP: [name] [facility] Attending: Josem Kaufmann MD, Lawrence Indication 1: Deep vein thrombus Indication 2: Encounter for therapeutic drug monitoring  V58.83  Patient Assessment Reviewed by: [name] PharmD  November 22, 2009 Medication review: verified warfarin dosage & schedule,verified previous prescription medications, verified doses & any changes, verified new medications, reviewed OTC medications, reviewed OTC health products-vitamins supplements etc Complications: none Dietary changes: none   Health status changes: none   Lifestyle changes: none   Recent/future hospitalizations: none   Recent/future procedures: none   Recent/future dental: none Patient Assessment Part 2:  Have you MISSED ANY DOSES or CHANGED TABLETS?  No missed Warfarin doses or changed tablets.  Have you had any BRUISING or BLEEDING ( nose or gum bleeds,blood in urine or stool)?  No reported bruising or bleeding in nose, gums, urine, stool.  Have you STARTED or STOPPED any MEDICATIONS, including OTC meds,herbals or supplements?  No other medications or herbal supplements were started or stopped.  Have you CHANGED your DIET, especially green vegetables,or ALCOHOL intake?  No changes in diet or alcohol intake.  Have you had any ILLNESSES or HOSPITALIZATIONS?  No reported illnesses or hospitalizations  Have you had any signs of CLOTTING?(chest discomfort,dizziness,shortness of breath,arms tingling,slurred speech,swelling or redness in leg)    No chest discomfort, dizziness, shortness of breath, tingling in arm, slurred speech, swelling, or redness in leg.     Treatment  Target INR: 2.0-3.0 INR: 1.6  Date: 11/22/2009 Regimen In:  85.0mg /week INR reflects regimen in: 1.6  New  Tablet strength: : 5mg  Regimen Out:     Sunday: 3 Tablet     Monday: 2 & 1/2 Tablet     Tuesday: 3 Tablet     Wednesday: 2 & 1/2 Tablet  Thursday: 3 Tablet      Friday: 2 & 1/2 Tablet     Saturday: 3 Tablet Total Weekly: 97.5mg /week mg  Next INR Due: 12/06/2009 Adjusted by: [name]. [name] PharmD CACP   Return to anticoagulation clinic:  12/06/2009 Time of next visit: 1445    Allergies: 1)  ! Septra 2)  ! Dapsone (Dapsone)

## 2010-11-29 NOTE — Assessment & Plan Note (Signed)
Summary: COU/CH  Anticoagulant Therapy Managed by: [name]. Janie Morning  PharmD CACP PCP: [name] [facility] Attending: Coralee Pesa MD, Levada Schilling Indication 1: Deep vein thrombus Indication 2: Encounter for therapeutic drug monitoring  V58.83  Patient Assessment Reviewed by: [name] PharmD  April 04, 2010 Medication review: verified warfarin dosage & schedule,verified previous prescription medications, verified doses & any changes, verified new medications, reviewed OTC medications, reviewed OTC health products-vitamins supplements etc Complications: none Dietary changes: none   Health status changes: none   Lifestyle changes: none   Recent/future hospitalizations: none   Recent/future procedures: none   Recent/future dental: none Patient Assessment Part 2:  Have you MISSED ANY DOSES or CHANGED TABLETS?  No missed Warfarin doses or changed tablets.  Have you had any BRUISING or BLEEDING ( nose or gum bleeds,blood in urine or stool)?  No reported bruising or bleeding in nose, gums, urine, stool.  Have you STARTED or STOPPED any MEDICATIONS, including OTC meds,herbals or supplements?  No other medications or herbal supplements were started or stopped.  Have you CHANGED your DIET, especially green vegetables,or ALCOHOL intake?  No changes in diet or alcohol intake.  Have you had any ILLNESSES or HOSPITALIZATIONS?  No reported illnesses or hospitalizations  Have you had any signs of CLOTTING?(chest discomfort,dizziness,shortness of breath,arms tingling,slurred speech,swelling or redness in leg)    No chest discomfort, dizziness, shortness of breath, tingling in arm, slurred speech, swelling, or redness in leg.     Treatment  Target INR: 2.0-3.0 INR: 2.5  Date: 04/04/2010 Regimen In:  100.0mg /week INR reflects regimen in: 2.5  New  Tablet strength: : 5mg  Regimen Out:     Sunday: 2 Tablet     Monday: 3 Tablet     Tuesday: 3 Tablet     Wednesday: 3 Tablet     Thursday: 3  Tablet      Friday: 3 Tablet     Saturday: 3 Tablet Total Weekly: 100mg /wk mg  Next INR Due: 05/09/2010 Adjusted by: [name]. [name] PharmD CACP   Return to anticoagulation clinic:  05/09/2010 Time of next visit: 1415    Allergies: 1)  ! Septra 2)  ! Dapsone (Dapsone)

## 2010-11-29 NOTE — Miscellaneous (Signed)
Summary: Orders Update  Clinical Lists Changes  Medications: Rx of OXYCODONE HCL 5 MG TABS (OXYCODONE HCL) Take 1 tablet by mouth two times a day;  #60 x 0;  Signed;  Entered by: Starleen Arms CMA;  Authorized by: Yisroel Ramming MD;  Method used: Print then Give to Patient    Prescriptions: OXYCODONE HCL 5 MG TABS (OXYCODONE HCL) Take 1 tablet by mouth two times a day  #60 x 0   Entered by:   Starleen Arms CMA   Authorized by:   Yisroel Ramming MD   Signed by:   Starleen Arms CMA on 03/25/2010   Method used:   Print then Give to Patient   RxID:   [id number]   Appended Document: Orders Update    Clinical Lists Changes  Medications: Rx of MS CONTIN 60 MG XR12H-TAB (MORPHINE SULFATE) Take 1 tablet by mouth two times a day;  #60 x 0;  Signed;  Entered by: Acey Lav MD;  Authorized by: Paulette Blanch Dam MD;  Method used: Print then Give to Patient    Prescriptions: MS CONTIN 60 MG XR12H-TAB (MORPHINE SULFATE) Take 1 tablet by mouth two times a day  #60 x 0   Entered and Authorized by:   Acey Lav MD   Signed by:   Paulette Blanch Dam MD on 03/25/2010   Method used:   Print then Give to Patient   RxID:   [id number]

## 2010-11-29 NOTE — Progress Notes (Signed)
Summary: patient wanting pain meds, info from [facility] received, RXes OK  Phone Note Call from Patient Call back at [phone]   Caller: Spouse Call For: [name] Summary of Call: Patient's wife states that he was released from the pain clinic, by Dr. Verl Dicker and he said that Dr. Orvan Falconer can maintain his pain medicine now. The patient states that he will be out of medication on 02/24/2010. Please advise Initial call taken by: Starleen Arms CMA,  February 16, 2010 12:45 PM  Follow-up for Phone Call        The patient stated that the medication is the same but the dosage is different, I called the pain clinic [facility]  and had to leave a message on Dr. Marko Stai nurse line. I left a detailed message and asked her to fax a copy of his medications. Follow-up by: Starleen Arms CMA,  February 16, 2010 4:03 PM  Additional Follow-up for Phone Call Additional follow up Details #1::        I agree to take over pain management and refills once meds and doses are known. Additional Follow-up by: [name],  February 16, 2010 4:17 PM    Additional Follow-up for Phone Call Additional follow up Details #2::    Report received from [facility].  Pain medications list are:  1.  MS Contin 60 mg Take 1 tablet by mouth two times a day    2.  Oxycodone 5 mg Take 1 tablet by mouth two times a day  3.  Neurontin 400 mg Take 1 tablet by mouth four times a day   These medications will be printed for signature by Dr. Daiva Eves who is present in clinic today. Jennet Maduro RN  February 17, 2010 9:12 AM    New/Updated Medications: NEURONTIN 400 MG CAPS (GABAPENTIN) Take 1 capsule by mouth four times a day MS CONTIN 60 MG XR12H-TAB (MORPHINE SULFATE) Take 1 tablet by mouth two times a day OXYCODONE HCL 5 MG TABS (OXYCODONE HCL) Take 1 tablet by mouth two times a day Prescriptions: OXYCODONE HCL 5 MG TABS (OXYCODONE HCL) Take 1 tablet by mouth two times a day  #60 x 0   Entered  by:   Jennet Maduro RN   Authorized by:   Acey Lav [name]   Signed by:   Jennet Maduro RN on 02/17/2010   Method used:   Print then Give to Patient   RxID:   [id number] MS CONTIN 60 MG XR12H-TAB (MORPHINE SULFATE) Take 1 tablet by mouth two times a day  #60 x 0   Entered by:   Jennet Maduro RN   Authorized by:   Acey Lav [name]   Signed by:   Jennet Maduro RN on 02/17/2010   Method used:   Print then Give to Patient   RxID:   [id number] OXYCODONE HCL 5 MG TABS (OXYCODONE HCL) Take 1 tablet by mouth two times a day  #60 x 0   Entered by:   Jennet Maduro RN   Authorized by:   [name]   Signed by:   Jennet Maduro RN on 02/17/2010   Method used:   Print then Give to Patient   RxID:   [id number] MS CONTIN 60 MG XR12H-TAB (MORPHINE SULFATE) Take 1 tablet by mouth two times a day  #60 x 0   Entered by:   Jennet Maduro RN   Authorized by:   [name]   Signed by:  Jennet Maduro RN on 02/17/2010   Method used:   Print then Give to Patient   RxID:   [id number] NEURONTIN 400 MG CAPS (GABAPENTIN) Take 1 capsule by mouth four times a day  #120 x prn   Entered by:   Jennet Maduro RN   Authorized by:   [name]   Signed by:   Jennet Maduro RN on 02/17/2010   Method used:   Electronically to        [facility] [phone]* (retail)       [address]       [address]       [place], [place]  [address]       Ph: [phone]       Fax: [phone]   RxID:   [id number]  Detroyed these 2 above.  Wrong [name].  Jennet Maduro RN  February 17, 2010 9:22 AM Tomasita Morrow, RN witnessed the rxes being destroyed. Jennet Maduro RN  February 17, 2010 9:26 AM

## 2010-11-29 NOTE — Miscellaneous (Signed)
Summary: [facility]:  CMN  [facility]:  CMN   Imported By: Florinda Marker 02/11/2010 14:36:53  _____________________________________________________________________  External Attachment:    Type:   Image     Comment:   External Document

## 2010-11-29 NOTE — Consult Note (Signed)
Summary: [facility]  [facility]   Imported By: Florinda Marker 06/09/2009 11:59:31  _____________________________________________________________________  External Attachment:    Type:   Image     Comment:   External Document

## 2010-11-29 NOTE — Assessment & Plan Note (Signed)
Summary: 261/ds  Anticoagulant Therapy Managed by: [name]. [name]  PharmD CACP PCP: [name] [facility] Attending: Coralee Pesa MD, Levada Schilling Indication 1: Deep vein thrombus Indication 2: Encounter for therapeutic drug monitoring  V58.83  Patient Assessment Reviewed by: [name] PharmD  Mar 07, 2010 Medication review: verified warfarin dosage & schedule,verified previous prescription medications, verified doses & any changes, verified new medications, reviewed OTC medications, reviewed OTC health products-vitamins supplements etc Complications: none Dietary changes: none   Health status changes: none   Lifestyle changes: none   Recent/future hospitalizations: none   Recent/future procedures: none   Recent/future dental: none Patient Assessment Part 2:  Have you MISSED ANY DOSES or CHANGED TABLETS?  No missed Warfarin doses or changed tablets.  Have you had any BRUISING or BLEEDING ( nose or gum bleeds,blood in urine or stool)?  No reported bruising or bleeding in nose, gums, urine, stool.  Have you STARTED or STOPPED any MEDICATIONS, including OTC meds,herbals or supplements?  No other medications or herbal supplements were started or stopped.  Have you CHANGED your DIET, especially green vegetables,or ALCOHOL intake?  No changes in diet or alcohol intake.  Have you had any ILLNESSES or HOSPITALIZATIONS?  No reported illnesses or hospitalizations  Have you had any signs of CLOTTING?(chest discomfort,dizziness,shortness of breath,arms tingling,slurred speech,swelling or redness in leg)    No chest discomfort, dizziness, shortness of breath, tingling in arm, slurred speech, swelling, or redness in leg.     Treatment  Target INR: 2.0-3.0 INR: 2.3  Date: 03/07/2010 Regimen In:  100.0mg /week INR reflects regimen in: 2.3  New  Tablet strength: : 5mg  Regimen Out:     Sunday: 3 Tablet     Monday: 2 & 1/2 Tablet     Tuesday: 3 Tablet     Wednesday: 3 Tablet  Thursday: 2 & 1/2 Tablet      Friday: 3 Tablet     Saturday: 3 Tablet Total Weekly: 100.0mg /week mg  Next INR Due: 04/04/2010 Adjusted by: [name]. [name] PharmD CACP   Return to anticoagulation clinic:  04/04/2010 Time of next visit: 1500    Allergies: 1)  ! Septra 2)  ! Dapsone (Dapsone)

## 2010-11-29 NOTE — Assessment & Plan Note (Signed)
Summary: COU/CH  Anticoagulant Therapy Managed by: [name]. Janie Morning  PharmD CACP PCP: [name] [facility] Attending: Onalee Hua MD, Manrique Indication 1: Deep vein thrombus Indication 2: Encounter for therapeutic drug monitoring  V58.83  Patient Assessment Reviewed by: [name] PharmD  August 29, 2010 Medication review: verified warfarin dosage & schedule,verified previous prescription medications, verified doses & any changes, verified new medications, reviewed OTC medications, reviewed OTC health products-vitamins supplements etc Complications: none Dietary changes: none   Health status changes: none   Lifestyle changes: none   Recent/future hospitalizations: none   Recent/future procedures: none   Recent/future dental: none Patient Assessment Part 2:  Have you MISSED ANY DOSES or CHANGED TABLETS?  No missed Warfarin doses or changed tablets.  Have you had any BRUISING or BLEEDING ( nose or gum bleeds,blood in urine or stool)?  No reported bruising or bleeding in nose, gums, urine, stool.  Have you STARTED or STOPPED any MEDICATIONS, including OTC meds,herbals or supplements?  No other medications or herbal supplements were started or stopped.  Have you CHANGED your DIET, especially green vegetables,or ALCOHOL intake?  No changes in diet or alcohol intake.  Have you had any ILLNESSES or HOSPITALIZATIONS?  No reported illnesses or hospitalizations  Have you had any signs of CLOTTING?(chest discomfort,dizziness,shortness of breath,arms tingling,slurred speech,swelling or redness in leg)    No chest discomfort, dizziness, shortness of breath, tingling in arm, slurred speech, swelling, or redness in leg.     Treatment  Target INR: 2.0-3.0 INR: 2.8  Date: 08/29/2010 Regimen In:  72.5mg /week INR reflects regimen in: 2.8  New  Tablet strength: : 5mg  Regimen Out:     Sunday: 2 Tablet     Monday: 2 Tablet     Tuesday: 2 Tablet     Wednesday: 2 & 1/2 Tablet  Thursday: 2 Tablet      Friday: 2 Tablet     Saturday: 2 Tablet Total Weekly: 72.5mg /week mg  Next INR Due: 09/26/2010 Adjusted by: [name]. [name] PharmD CACP   Return to anticoagulation clinic:  09/26/2010 Time of next visit: 1530    Allergies: 1)  ! Septra 2)  ! Dapsone (Dapsone)

## 2010-11-29 NOTE — Miscellaneous (Signed)
  Clinical Lists Changes  Observations: Added new observation of YEARAIDSPOS: 2000  (09/27/2010 11:40)

## 2010-11-29 NOTE — Progress Notes (Signed)
Summary: CT resutls  Phone Note Call from Patient Call back at Home Phone [phone]   Reason for Call: Lab or Test Results Summary of Call: Pt. would like to find out the results of the CT Chest on 04/19/10 @ [facility].  Please advise or call pt. with results. Jennet Maduro RN  April 21, 2010 2:29 PM   Note from Dr. Orvan Falconer for pt. ""spots seen on CT scan last year have resolved."  Pt. called and message shared.  Pt. coming to pick up rxes today before lunch. Jennet Maduro RN  April 26, 2010 10:55 AM

## 2010-11-29 NOTE — Assessment & Plan Note (Signed)
Summary: COU/CH  Anticoagulant Therapy Managed by: [name]. [name]  PharmD CACP PCP: [name] [facility] Attending: [name] Indication 1: Deep vein thrombus Indication 2: Encounter for therapeutic drug monitoring  V58.83  Patient Assessment Reviewed by: [name] PharmD  September 26, 2010 Medication review: verified warfarin dosage & schedule,verified previous prescription medications, verified doses & any changes, verified new medications, reviewed OTC medications, reviewed OTC health products-vitamins supplements etc Complications: none Dietary changes: none   Health status changes: none   Lifestyle changes: none   Recent/future hospitalizations: none   Recent/future procedures: none   Recent/future dental: none Patient Assessment Part 2:  Have you MISSED ANY DOSES or CHANGED TABLETS?  No missed Warfarin doses or changed tablets.  Have you had any BRUISING or BLEEDING ( nose or gum bleeds,blood in urine or stool)?  No reported bruising or bleeding in nose, gums, urine, stool.  Have you STARTED or STOPPED any MEDICATIONS, including OTC meds,herbals or supplements?  No other medications or herbal supplements were started or stopped.  Have you CHANGED your DIET, especially green vegetables,or ALCOHOL intake?  No changes in diet or alcohol intake.  Have you had any ILLNESSES or HOSPITALIZATIONS?  No reported illnesses or hospitalizations  Have you had any signs of CLOTTING?(chest discomfort,dizziness,shortness of breath,arms tingling,slurred speech,swelling or redness in leg)    No chest discomfort, dizziness, shortness of breath, tingling in arm, slurred speech, swelling, or redness in leg.     Treatment  Target INR: 2.0-3.0 INR: 2.3  Date: 09/26/2010 Regimen In:  72.5mg /week INR reflects regimen in: 2.3  New  Tablet strength: : 5mg  Regimen Out:     Sunday: 2 Tablet     Monday: 2 & 1/2 Tablet     Tuesday: 2 Tablet     Wednesday: 2 & 1/2 Tablet  Thursday: 2 Tablet      Friday: 2 & 1/2 Tablet     Saturday: 2 Tablet Total Weekly: 77.5mg /week mg  Next INR Due: 10/17/2010 Adjusted by: [name]. [name] PharmD CACP   Return to anticoagulation clinic:  10/17/2010 Time of next visit: 1530    Allergies: 1)  ! Septra 2)  ! Dapsone (Dapsone)

## 2010-11-29 NOTE — Assessment & Plan Note (Signed)
Summary: F/U/VS   Primary Provider:  [name]  CC:  f/u.  History of Present Illness: [name] is in for his routine visit.  He has not missed a single dose of his medications.  His pain remains under good control and he is using his prosthesis without difficulty.  His only complaint is that he has had problems with erectile dysfunction since hospitalization last year.  He says that he does not want to try an erectile dysfunction medication now because he and his wife are not sexually active since she is currently undergoing therapy for rectal cancer.  Preventive Screening-Counseling & Management  Alcohol-Tobacco     Alcohol drinks/day: 0     Smoking Status: current     Smoking Cessation Counseling: yes     Packs/Day: 1.0     Cans of tobacco/week: no     Passive Smoke Exposure: yes  Caffeine-Diet-Exercise     Caffeine use/day: yes     Does Patient Exercise: yes     Type of exercise: gardening     Exercise (avg: min/session): >60     Times/week: <3  Hep-HIV-STD-Contraception     HIV Risk: no risk noted     HIV Risk Counseling: not indicated-no HIV risk noted  Safety-Violence-Falls     Seat Belt Use: yes  Comments: condoms declined      Sexual History:  currently monogamous.        Drug Use:  never.     Prior Medication List:  ZERIT 40 MG CAPS (STAVUDINE) Take 1 capsule by mouth two times a day [name] 300 MG TABS (TENOFOVIR DISOPROXIL FUMARATE) Take 1 tablet by mouth once a day KALETRA 200-50 MG TABS (LOPINAVIR-RITONAVIR) Take 2 tablet by mouth twice daily NEURONTIN 400 MG CAPS (GABAPENTIN) Take 1 capsule by mouth four times a day MS CONTIN 60 MG XR12H-TAB (MORPHINE SULFATE) Take 1 tablet by mouth two times a day ASPIRIN 81 MG TBEC (ASPIRIN) Take 1 tablet by mouth once a day COUMADIN 5 MG TABS (WARFARIN SODIUM) Please take 3 tablets today and the next few days until you are seen in the Coumadin clinic or with Dr. Orvan Falconer in ID clinic. ANDROGEL 50 MG/5GM GEL  (TESTOSTERONE) Please apply one 5GM packet to skin of upper chest or shoulders daily. ENSURE  LIQD (NUTRITIONAL SUPPLEMENTS) Please take one can 3 times dailyas a nutrition supplement. OXYCODONE HCL 5 MG TABS (OXYCODONE HCL) Take 1 tablet by mouth two times a day   Current Allergies (reviewed today): ! SEPTRA ! DAPSONE (DAPSONE) Social History: Drug Use:  never  Vital Signs:  Patient profile:   50 year old male Height:      72 inches (182.88 cm) Weight:      145 pounds (65.91 kg) BMI:     19.74 Temp:     98.3 degrees F (36.83 degrees C) oral Pulse rate:   94 / minute BP sitting:   144 / 70  (left arm) Cuff size:   regular  Vitals Entered By: Jennet Maduro RN (April 12, 2010 10:08 AM) CC: f/u Is Patient Diabetic? No Pain Assessment Patient in pain? no      Nutritional Status BMI of 19 -24 = normal Nutritional Status Detail appetite "so-so"  One to two Ensures/day  Have you ever been in a relationship where you felt threatened, hurt or afraid?No   Does patient need assistance? Functional Status Self care Ambulation Impaired:Risk for fall Comments uses dual canes for walking, no missed doses of rxes  Physical Exam  General:  alert and well-nourished.   Mouth:  pharynx pink and moist, no erythema, and no exudates.   Lungs:  normal respiratory effort, normal breath sounds, no crackles, and no wheezes.   Heart:  normal rate, regular rhythm, and no murmur.   Abdomen:  soft, non-tender, and normal bowel sounds.   Skin:  no rashes.   Axillary Nodes:  no R axillary adenopathy and no L axillary adenopathy.   Psych:  normally interactive, good eye contact, not anxious appearing, and not depressed appearing.          Medication Adherence: 04/12/2010   Adherence to medications reviewed with patient. Counseling to provide adequate adherence provided   Prevention For Positives: 04/12/2010   Safe sex practices discussed with patient. Condoms offered.                              Impression & Recommendations:  Problem # 1:  HIV DISEASE (ICD-042) [name]'s HIV infection remains under excellent control.  I will not make any changes today. Diagnostics Reviewed:  HIV: CDC-defined AIDS (07/20/2009)   CD4: 1140 (03/30/2010)   WBC: 9.1 (12/14/2009)   Hgb: 13.7 (12/14/2009)   HCT: 40.1 (12/14/2009)   Platelets: 356 (12/14/2009) HIV-1 RNA: <48 copies/mL (03/29/2010)   HBSAg: NO (12/24/2006)  Problem # 2:  ERECTILE DYSFUNCTION, ORGANIC (ICD-607.84) I will check a testosterone level before his next visit. Orders: T-Testosterone; Total [phone])  Problem # 3:  PULMONARY NODULE (ICD-518.89) I will repeat a chest CT scan today to follow-up the abnormalities seen last fall. Orders: CT with & without Contrast (CT w&w/o Contrast)  Other Orders: Est. Patient Level IV (09811) Future Orders: T-CD4SP ([facility]) (CD4SP) ... 10/09/2010 T-HIV Viral Load [phone]) ... 10/09/2010 T-Comprehensive Metabolic Panel [phone]) ... 10/09/2010 T-CBC w/Diff ([id number]) ... 10/09/2010 T-RPR (Syphilis) [phone]) ... 10/09/2010 T-Lipid Profile [phone]) ... 10/09/2010  Patient Instructions: 1)  Please schedule a follow-up appointment in 6 months.  Prescriptions: ANDROGEL 50 MG/5GM GEL (TESTOSTERONE) Please apply one 5GM packet to skin of upper chest or shoulders daily.  #30 x 11   Entered by:   Jennet Maduro RN   Authorized by:   [name]   Signed by:   Jennet Maduro RN on 04/12/2010   Method used:   Telephoned to ...       [facility] [address] [phone]* (retail)       [address]       [address]       [place], [place]  [address]       Ph: [phone]       Fax: [phone]   RxID:   [id number]

## 2010-11-29 NOTE — Progress Notes (Signed)
Summary: Pain rxes refilled  Phone Note Call from Patient Call back at Home Phone [phone]   Caller: Spouse Reason for Call: Refill Medication Summary of Call: Pain rx refills.  will pick up later today. Jennet Maduro RN  May 27, 2010 9:36 AM     Prescriptions: OXYCODONE HCL 5 MG TABS (OXYCODONE HCL) Take 1 tablet by mouth two times a day  #60 x 0   Entered by:   Jennet Maduro RN   Authorized by:   Yisroel Ramming MD   Signed by:   Jennet Maduro RN on 05/27/2010   Method used:   Print then Give to Patient   RxID:   [id number] MS CONTIN 60 MG XR12H-TAB (MORPHINE SULFATE) Take 1 tablet by mouth two times a day  #60 x 0   Entered by:   Jennet Maduro RN   Authorized by:   Yisroel Ramming MD   Signed by:   Jennet Maduro RN on 05/27/2010   Method used:   Print then Give to Patient   RxID:   [id number]

## 2010-11-29 NOTE — Miscellaneous (Signed)
Summary: [facility]   Imported By: Florinda Marker 12/03/2009 16:31:10  _____________________________________________________________________  External Attachment:    Type:   Image     Comment:   External Document

## 2010-12-01 NOTE — Assessment & Plan Note (Addendum)
Summary: COU/CH  Anticoagulant Therapy Managed by: [name]. Janie Morning  PharmD CACP PCP: [name] [facility] Attending: Margarito Liner MD Indication 1: Deep vein thrombus Indication 2: Encounter for therapeutic drug monitoring  V58.83  Patient Assessment Reviewed by: [name] PharmD  October 17, 2010 Medication review: verified warfarin dosage & schedule,verified previous prescription medications, verified doses & any changes, verified new medications, reviewed OTC medications, reviewed OTC health products-vitamins supplements etc Complications: none Dietary changes: none   Health status changes: none   Lifestyle changes: none   Recent/future hospitalizations: none   Recent/future procedures: none   Recent/future dental: none Patient Assessment Part 2:  Have you MISSED ANY DOSES or CHANGED TABLETS?  No missed Warfarin doses or changed tablets.  Have you had any BRUISING or BLEEDING ( nose or gum bleeds,blood in urine or stool)?  No reported bruising or bleeding in nose, gums, urine, stool.  Have you STARTED or STOPPED any MEDICATIONS, including OTC meds,herbals or supplements?  No other medications or herbal supplements were started or stopped.  Have you CHANGED your DIET, especially green vegetables,or ALCOHOL intake?  No changes in diet or alcohol intake.  Have you had any ILLNESSES or HOSPITALIZATIONS?  No reported illnesses or hospitalizations  Have you had any signs of CLOTTING?(chest discomfort,dizziness,shortness of breath,arms tingling,slurred speech,swelling or redness in leg)    No chest discomfort, dizziness, shortness of breath, tingling in arm, slurred speech, swelling, or redness in leg.     Treatment  Target INR: 2.0-3.0 INR: 2.0  Date: 10/17/2010 Regimen In:  77.5mg /week INR reflects regimen in: 2.0  New  Tablet strength: : 5mg  Regimen Out:     Sunday: 2 & 1/2 Tablet     Monday: 2 Tablet     Tuesday: 2 & 1/2 Tablet     Wednesday: 2 & 1/2 Tablet    Thursday: 2 Tablet      Friday: 2 & 1/2 Tablet     Saturday: 2 & 1/2 Tablet Total Weekly: 82.5mg /week mg  Next INR Due: 11/07/2010 Adjusted by: [name]. [name] PharmD CACP   Return to anticoagulation clinic:  11/07/2010 Time of next visit: 1545    Allergies: 1)  ! Septra 2)  ! Dapsone (Dapsone)

## 2010-12-01 NOTE — Assessment & Plan Note (Addendum)
Summary: COU/CH  Anticoagulant Therapy Managed by: [name]. Janie Morning  PharmD CACP PCP: [name] [facility] Attending: Darl Pikes, Beth Indication 1: Deep vein thrombus Indication 2: Encounter for therapeutic drug monitoring  V58.83  Patient Assessment Reviewed by: [name] PharmD  November 21, 2010 Medication review: verified warfarin dosage & schedule,verified previous prescription medications, verified doses & any changes, verified new medications, reviewed OTC medications, reviewed OTC health products-vitamins supplements etc Complications: none Dietary changes: none   Health status changes: none   Lifestyle changes: none   Recent/future hospitalizations: none   Recent/future procedures: none   Recent/future dental: none Patient Assessment Part 2:  Have you MISSED ANY DOSES or CHANGED TABLETS?  No missed Warfarin doses or changed tablets.  Have you had any BRUISING or BLEEDING ( nose or gum bleeds,blood in urine or stool)?  No reported bruising or bleeding in nose, gums, urine, stool.  Have you STARTED or STOPPED any MEDICATIONS, including OTC meds,herbals or supplements?  No other medications or herbal supplements were started or stopped.  Have you CHANGED your DIET, especially green vegetables,or ALCOHOL intake?  No changes in diet or alcohol intake.  Have you had any ILLNESSES or HOSPITALIZATIONS?  No reported illnesses or hospitalizations  Have you had any signs of CLOTTING?(chest discomfort,dizziness,shortness of breath,arms tingling,slurred speech,swelling or redness in leg)    No chest discomfort, dizziness, shortness of breath, tingling in arm, slurred speech, swelling, or redness in leg.     Treatment  Target INR: 2.0-3.0 INR: 2.3  Date: 11/21/2010 Regimen In:  80.0mg /week INR reflects regimen in: 2.3  New  Tablet strength: : 5mg  Regimen Out:     Sunday: 2 & 1/2 Tablet     Monday: 2 Tablet     Tuesday: 2 & 1/2 Tablet     Wednesday: 2 Tablet  Thursday: 2 & 1/2 Tablet      Friday: 2 Tablet     Saturday: 2 & 1/2 Tablet Total Weekly: 80.0mg /week mg  Next INR Due: 12/12/2010 Adjusted by: [name]. [name] PharmD CACP   Return to anticoagulation clinic:  12/12/2010 Time of next visit: 1615    Allergies: 1)  ! Septra 2)  ! Dapsone (Dapsone)

## 2010-12-01 NOTE — Assessment & Plan Note (Signed)
Summary: COU  Anticoagulant Therapy Managed by: [name]. Janie Morning  PharmD CACP PCP: [name] [facility] Attending: Rogelia Boga MD, Lanora Manis Indication 1: Deep vein thrombus Indication 2: Encounter for therapeutic drug monitoring  V58.83  Patient Assessment Reviewed by: [name] PharmD  November 07, 2010 Medication review: verified warfarin dosage & schedule,verified previous prescription medications, verified doses & any changes, verified new medications, reviewed OTC medications, reviewed OTC health products-vitamins supplements etc Complications: none Dietary changes: none   Health status changes: none   Lifestyle changes: none   Recent/future hospitalizations: none   Recent/future procedures: none   Recent/future dental: none Patient Assessment Part 2:  Have you MISSED ANY DOSES or CHANGED TABLETS?  No missed Warfarin doses or changed tablets.  Have you had any BRUISING or BLEEDING ( nose or gum bleeds,blood in urine or stool)?  No reported bruising or bleeding in nose, gums, urine, stool.  Have you STARTED or STOPPED any MEDICATIONS, including OTC meds,herbals or supplements?  No other medications or herbal supplements were started or stopped.  Have you CHANGED your DIET, especially green vegetables,or ALCOHOL intake?  No changes in diet or alcohol intake.  Have you had any ILLNESSES or HOSPITALIZATIONS?  No reported illnesses or hospitalizations  Have you had any signs of CLOTTING?(chest discomfort,dizziness,shortness of breath,arms tingling,slurred speech,swelling or redness in leg)    No chest discomfort, dizziness, shortness of breath, tingling in arm, slurred speech, swelling, or redness in leg.     Treatment  Target INR: 2.0-3.0 INR: 4.5  Date: 11/07/2010 Regimen In:  82.5mg /week INR reflects regimen in: 4.5  New  Tablet strength: : 5mg  Regimen Out:     Sunday: 2 & 1/2 Tablet     Monday: 2 Tablet     Tuesday: 2 & 1/2 Tablet     Wednesday: 2 Tablet  Thursday: 2 & 1/2 Tablet      Friday: 2 Tablet     Saturday: 2 & 1/2 Tablet Total Weekly: 80.0mg /week mg  Next INR Due: 11/21/2010 Adjusted by: [name]. [name] PharmD CACP   Return to anticoagulation clinic:  11/21/2010 Time of next visit: 1600  Hold:  1 Days     Allergies: 1)  ! Septra 2)  ! Dapsone (Dapsone)

## 2010-12-07 NOTE — Assessment & Plan Note (Signed)
Summary: F/U OV/VS   Primary Provider:  [name]  CC:  follow-up visit.  History of Present Illness: [name] is in for his routine visit.  He has not missed a single dose of his medications with the exception of having stopped using his AndroGel because he did not feel that it worked.  He says that he has the phantom right leg pain every day and nearly all day.  He takes this to oxycodone for breakthrough pain every day and on occasion when it's more severe he takes a third even though he knows this will make him on short each month.  Preventive Screening-Counseling & Management  Alcohol-Tobacco     Alcohol drinks/day: 0     Smoking Status: current     Smoking Cessation Counseling: yes     Smoke Cessation Stage: precontemplative     Packs/Day: 1.0     Cans of tobacco/week: no     Passive Smoke Exposure: yes  Caffeine-Diet-Exercise     Caffeine use/day: yes     Does Patient Exercise: yes     Type of exercise: gardening     Exercise (avg: min/session): >60     Times/week: <3  Hep-HIV-STD-Contraception     HIV Risk: no risk noted     HIV Risk Counseling: not indicated-no HIV risk noted  Safety-Violence-Falls     Seat Belt Use: yes  Comments: declined condoms      Sexual History:  currently monogamous.        Drug Use:  former.     Updated Prior Medication List: ZERIT 40 MG CAPS (STAVUDINE) Take 1 capsule by mouth two times a day [name] 300 MG TABS (TENOFOVIR DISOPROXIL FUMARATE) Take 1 tablet by mouth once a day KALETRA 200-50 MG TABS (LOPINAVIR-RITONAVIR) Take 2 tablet by mouth twice daily NEURONTIN 400 MG CAPS (GABAPENTIN) Take 1 capsule by mouth four times a day MS CONTIN 60 MG XR12H-TAB (MORPHINE SULFATE) Take 1 tablet by mouth two times a day OXYCODONE HCL 5 MG TABS (OXYCODONE HCL) Take 1 tablet by mouth two times a day ASPIRIN 81 MG TBEC (ASPIRIN) Take 1 tablet by mouth once a day COUMADIN 5 MG TABS (WARFARIN SODIUM) Please take 3 tablets today and the next  few days until you are seen in the Coumadin clinic or with Dr. Orvan Falconer in ID clinic. ENSURE  LIQD (NUTRITIONAL SUPPLEMENTS) Drink one can by mouth 3 times a day  Current Allergies (reviewed today): ! SEPTRA ! DAPSONE (DAPSONE) Social History: Drug Use:  former  Vital Signs:  Patient profile:   50 year old male Height:      72 inches (182.88 cm) Weight:      145 pounds (65.91 kg) BMI:     19.74 Temp:     98.1 degrees F (36.72 degrees C) oral Pulse rate:   74 / minute BP sitting:   138 / 69  (left arm) Cuff size:   regular  Vitals Entered By: Jennet Maduro RN (November 29, 2010 10:08 AM) CC: follow-up visit Is Patient Diabetic? No Pain Assessment Patient in pain? yes     Location: right leg Intensity: 2 Type: phantom, hurts Onset of pain  Intermittent Nutritional Status BMI of 19 -24 = normal Nutritional Status Detail appetite "good"  Have you ever been in a relationship where you felt threatened, hurt or afraid?No   Does patient need assistance? Functional Status Self care Ambulation Impaired:Risk for fall Comments right leg, uses crutches   Physical Exam  General:  alert and well-nourished.   Mouth:  pharynx pink and moist, no erythema, and no exudates.   Lungs:  normal respiratory effort, normal breath sounds, no crackles, and no wheezes.   Heart:  normal rate, regular rhythm, and no murmur.   Extremities:  he has his artificial leg on and walks with the aid of crutches. Psych:  normally interactive, good eye contact, not anxious appearing, and not depressed appearing.          Medication Adherence: 11/29/2010   Adherence to medications reviewed with patient. Counseling to provide adequate adherence provided   Prevention For Positives: 11/29/2010   Safe sex practices discussed with patient. Condoms offered.                             Impression & Recommendations:  Problem # 1:  HIV DISEASE (ICD-042) His infection remains under excellent  control.  I will change his Kaletra to boosted Reyataz as a first step in addressing his dyslipidemia. Diagnostics Reviewed:  HIV: CDC-defined AIDS (07/20/2009)   CD4: 1090 (11/16/2010)   WBC: 9.2 (11/15/2010)   Hgb: 13.9 (11/15/2010)   HCT: 40.2 (11/15/2010)   Platelets: 367 (11/15/2010) HIV-1 RNA: <20 copies/mL (11/15/2010)   HBSAg: NO (12/24/2006)  Problem # 2:  HYPOGONADISM (ICD-257.2) He chooses to stay off of and her gel for now. Orders: Est. Patient Level IV (04540)  Problem # 3:  DYSLIPIDEMIA (ICD-272.4) I will change the Kaletra to Reyataz and Norvir and repeat his lipid panel before his next visit in 3 months.  He will probably need additional therapy. Labs Reviewed: SGOT: 30 (11/15/2010)   SGPT: 23 (11/15/2010)   HDL:36 (11/15/2010), 38 (12/17/2008)  LDL:See Comment mg/dL (98/08/9146), 829 (56/21/3086)  Chol:270 (11/15/2010), 257 (12/17/2008)  Trig:569 (11/15/2010), 269 (12/17/2008)  Orders: Est. 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 to 75 mg b.i.d. and to continue his current dose of oxycodone for breakthrough pain. Orders: Est. Patient Level IV (96295)  Medications Added to Medication List This Visit: 1)  Reyataz 300 Mg Caps (Atazanavir sulfate) .... Take 1 capsule by mouth once a day 2)  Ms Contin 15 Mg Xr12h-tab (Morphine sulfate) .... Take 1 tablet by mouth two times a day 3)  Norvir 100 Mg Tabs (Ritonavir) .... Take 1 tablet by mouth once a day 4)  Ms Contin 60 Mg Xr12h-tab (Morphine sulfate) .... Take 1 tablet by mouth two times a day  Other Orders: Future Orders: T-CD4SP ([facility]) (CD4SP) ... 02/27/2011 T-HIV Viral Load [id number]) ... 02/27/2011 T-Lipid Profile [phone]) ... 02/27/2011  Patient Instructions: 1)  Please schedule a follow-up appointment in 3 months.  Prescriptions: MS CONTIN 60 MG XR12H-TAB  (MORPHINE SULFATE) Take 1 tablet by mouth two times a day  #60 x 0   Entered and Authorized by:   [name]   Signed by:   [name] on 11/29/2010   Method used:   Print then Give to Patient   RxID:   [id number] MS CONTIN 15 MG XR12H-TAB (MORPHINE SULFATE) Take 1 tablet by mouth two times a day  #60 x 0   Entered and Authorized by:   [name]   Signed by:   [name] on 11/29/2010   Method used:   Print then Give to Patient  RxID:   [id number]

## 2010-12-12 ENCOUNTER — Ambulatory Visit (INDEPENDENT_AMBULATORY_CARE_PROVIDER_SITE_OTHER): Payer: Medicare Other | Admitting: Pharmacist

## 2010-12-12 DIAGNOSIS — Z7901 Long term (current) use of anticoagulants: Secondary | ICD-10-CM

## 2010-12-12 DIAGNOSIS — I749 Embolism and thrombosis of unspecified artery: Secondary | ICD-10-CM

## 2010-12-12 NOTE — Patient Instructions (Signed)
Patient instructed to take medications as defined in the Anti-coagulation Track section of this encounter.  Patient instructed to take today's dose.  Patient verbalized understanding of these instructions.    

## 2010-12-12 NOTE — Progress Notes (Signed)
Anti-Coagulation Progress Note  Valiant Dills is a 50 y.o. male who is currently on an anti-coagulation regimen.    RECENT RESULTS: Recent results are below, the most recent result is correlated with a dose of 80 mg. per week: Lab Results  Component Value Date   INR 3.6 12/12/2010   INR 2.3 11/21/2010   INR 4.5 11/07/2010    ANTI-COAG DOSE:   Latest dosing instructions   Total Sun Mon Tue Wed Thu Fri Sat   75 12.5 mg 10 mg 10 mg 10 mg 12.5 mg 10 mg 10 mg    (5 mg2.5) (5 mg2) (5 mg2) (5 mg2) (5 mg2.5) (5 mg2) (5 mg2)         ANTICOAG SUMMARY: Anticoagulation Episode Summary              Current INR goal 2.0-3.0 Next INR check 01/02/2011   INR from last check 3.6! (12/12/2010)     Weekly max dose (mg)  Target end date Indefinite   Indications EMBOLISM AND THROMBOSIS, ARTERY, Long term current use of anticoagulant   INR check location [facility] Preferred lab    Send INR reminders to [facility] IMP   Comments        Provider Role Specialty Phone number   Dewayne Shorter  Infectious Diseases [phone]        ANTICOAG TODAY: Anticoagulation Summary as of 12/12/2010              INR goal 2.0-3.0     Selected INR 3.6! (12/12/2010) Next INR check 01/02/2011   Weekly max dose (mg)  Target end date Indefinite   Indications EMBOLISM AND THROMBOSIS, ARTERY, Long term current use of anticoagulant    Anticoagulation Episode Summary              INR check location [facility] Preferred lab    Send INR reminders to [facility]   Comments        Provider Role Specialty Phone number   Dewayne Shorter  Infectious Diseases [phone]        PATIENT INSTRUCTIONS: Patient Instructions  Patient instructed to take medications as defined in the Anti-coagulation Track section of this encounter.  Patient instructed to take today's dose.  Patient verbalized understanding of these instructions.        FOLLOW-UP Return in 3 weeks (on 01/02/2011) for Follow up  INR.[name], [name] Pharm.D., CACP

## 2011-01-02 ENCOUNTER — Ambulatory Visit (INDEPENDENT_AMBULATORY_CARE_PROVIDER_SITE_OTHER): Payer: Medicare Other | Admitting: Pharmacist

## 2011-01-02 DIAGNOSIS — Z7901 Long term (current) use of anticoagulants: Secondary | ICD-10-CM

## 2011-01-02 DIAGNOSIS — I749 Embolism and thrombosis of unspecified artery: Secondary | ICD-10-CM

## 2011-01-02 LAB — POCT INR: INR: 6.7

## 2011-01-02 NOTE — Patient Instructions (Signed)
Patient instructed to take medications as defined in the Anti-coagulation Track section of this encounter.  Patient instructed to OMIT TWO DAYS dose.  Patient verbalized understanding of these instructions.

## 2011-01-02 NOTE — Progress Notes (Signed)
Anti-Coagulation Progress Note  [name] is a 50 y.o. male who is currently on an anti-coagulation regimen.    RECENT RESULTS: Recent results are below, the most recent result is correlated with a dose of 75 mg. per week: Lab Results  Component Value Date   INR 6.7 01/02/2011   INR 3.6 12/12/2010   INR 2.3 11/21/2010   PROTIME 39.6* 03/24/2010    ANTI-COAG DOSE:   Latest dosing instructions   Total Sun Mon Tue Wed Thu Fri Sat   50 10 mg 10 mg Hold Hold 10 mg 10 mg 10 mg    (5 mg2) (5 mg2) Hold Hold (5 mg2) (5 mg2) (5 mg2)         ANTICOAG SUMMARY: Anticoagulation Episode Summary              Current INR goal 2.0-3.0 Next INR check 01/10/2011   INR from last check 6.7! (01/02/2011)     Weekly max dose (mg)  Target end date Indefinite   Indications EMBOLISM AND THROMBOSIS, ARTERY, Long term current use of anticoagulant   INR check location [facility] Preferred lab    Send INR reminders to [facility] IMP   Comments        Provider Role Specialty Phone number   Dewayne Shorter  Infectious Diseases [phone]        ANTICOAG TODAY: Anticoagulation Summary as of 01/02/2011              INR goal 2.0-3.0     Selected INR 6.7! (01/02/2011) Next INR check 01/10/2011   Weekly max dose (mg)  Target end date Indefinite   Indications EMBOLISM AND THROMBOSIS, ARTERY, Long term current use of anticoagulant    Anticoagulation Episode Summary              INR check location [facility] Preferred lab    Send INR reminders to [facility] IMP   Comments        Provider Role Specialty Phone number   Dewayne Shorter  Infectious Diseases [phone]        PATIENT INSTRUCTIONS: Patient Instructions  Patient instructed to take medications as defined in the Anti-coagulation Track section of this encounter.  Patient instructed to OMIT TWO DAYS dose.  Patient verbalized understanding of these instructions.        FOLLOW-UP Return in 8 days (on 01/10/2011) for Follow up  INR.  [name], [name] Pharm.D., CACP

## 2011-01-10 ENCOUNTER — Ambulatory Visit (INDEPENDENT_AMBULATORY_CARE_PROVIDER_SITE_OTHER): Payer: Medicare Other | Admitting: Pharmacist

## 2011-01-10 ENCOUNTER — Telehealth: Payer: Self-pay | Admitting: Internal Medicine

## 2011-01-10 DIAGNOSIS — Z7901 Long term (current) use of anticoagulants: Secondary | ICD-10-CM

## 2011-01-10 DIAGNOSIS — I749 Embolism and thrombosis of unspecified artery: Secondary | ICD-10-CM

## 2011-01-10 LAB — POCT INR: INR: 3.5

## 2011-01-10 NOTE — Progress Notes (Signed)
Anti-Coagulation Progress Note  [name] is a 50 y.o. male who is currently on an anti-coagulation regimen.    RECENT RESULTS: Recent results are below, the most recent result is correlated with a dose of 67.5 mg. per week: Lab Results  Component Value Date   INR 3.5 01/10/2011   INR 6.7 01/02/2011   INR 3.6 12/12/2010   PROTIME 39.6* 03/24/2010    ANTI-COAG DOSE:   Latest dosing instructions   Total Sun Mon Tue Wed Thu Fri Sat   62.5 10 mg 10 mg 7.5 mg 10 mg 7.5 mg 10 mg 7.5 mg    (5 mg2) (5 mg2) (5 mg1.5) (5 mg2) (5 mg1.5) (5 mg2) (5 mg1.5)         ANTICOAG SUMMARY: Anticoagulation Episode Summary              Current INR goal 2.0-3.0 Next INR check 01/23/2011   INR from last check 3.5! (01/10/2011)     Weekly max dose (mg)  Target end date Indefinite   Indications EMBOLISM AND THROMBOSIS, ARTERY, Long term current use of anticoagulant   INR check location [facility] Preferred lab    Send INR reminders to [facility] IMP   Comments        Provider Role Specialty Phone number   Dewayne Shorter  Infectious Diseases [phone]        ANTICOAG TODAY: Anticoagulation Summary as of 01/10/2011              INR goal 2.0-3.0     Selected INR 3.5! (01/10/2011) Next INR check 01/23/2011   Weekly max dose (mg)  Target end date Indefinite   Indications EMBOLISM AND THROMBOSIS, ARTERY, Long term current use of anticoagulant    Anticoagulation Episode Summary              INR check location [facility] Preferred lab    Send INR reminders to [facility] IMP   Comments        Provider Role Specialty Phone number   Dewayne Shorter  Infectious Diseases [phone]        PATIENT INSTRUCTIONS: Patient Instructions  Patient instructed to take medications as defined in the Anti-coagulation Track section of this encounter.  Patient instructed to take today's dose.  Patient verbalized understanding of these instructions.        FOLLOW-UP Return in 2 weeks (on  01/23/2011).  [name], [name] Pharm.D., CACP

## 2011-01-10 NOTE — Patient Instructions (Signed)
Patient instructed to take medications as defined in the Anti-coagulation Track section of this encounter.  Patient instructed to take today's dose.  Patient verbalized understanding of these instructions.    

## 2011-01-16 ENCOUNTER — Telehealth: Payer: Self-pay | Admitting: Internal Medicine

## 2011-01-16 LAB — T-HELPER CELL (CD4) - (RCID CLINIC ONLY)
CD4 % Helper T Cell: 18 % — ABNORMAL LOW (ref 33–55)
CD4 T Cell Abs: 1140 uL (ref 400–2700)

## 2011-01-20 LAB — T-HELPER CELL (CD4) - (RCID CLINIC ONLY): CD4 T Cell Abs: 1330 uL (ref 400–2700)

## 2011-01-23 ENCOUNTER — Ambulatory Visit (INDEPENDENT_AMBULATORY_CARE_PROVIDER_SITE_OTHER): Payer: Medicare Other | Admitting: Pharmacist

## 2011-01-23 DIAGNOSIS — Z7901 Long term (current) use of anticoagulants: Secondary | ICD-10-CM

## 2011-01-23 DIAGNOSIS — I749 Embolism and thrombosis of unspecified artery: Secondary | ICD-10-CM

## 2011-01-23 NOTE — Progress Notes (Signed)
Anti-Coagulation Progress Note  [name] is a 50 y.o. male who is currently on an anti-coagulation regimen.    RECENT RESULTS: Recent results are below, the most recent result is correlated with a dose of 62.5 mg. per week: Lab Results  Component Value Date   INR 2.5 01/23/2011   INR 3.5 01/10/2011   INR 6.7 01/02/2011   PROTIME 39.6* 03/24/2010    ANTI-COAG DOSE:   Latest dosing instructions   Total Sun Mon Tue Wed Thu Fri Sat   62.5 10 mg 10 mg 7.5 mg 10 mg 7.5 mg 10 mg 7.5 mg    (5 mg2) (5 mg2) (5 mg1.5) (5 mg2) (5 mg1.5) (5 mg2) (5 mg1.5)         ANTICOAG SUMMARY: Anticoagulation Episode Summary              Current INR goal 2.0-3.0 Next INR check 02/20/2011   INR from last check 2.5 (01/23/2011)     Weekly max dose (mg)  Target end date Indefinite   Indications EMBOLISM AND THROMBOSIS, ARTERY, Long term current use of anticoagulant   INR check location [facility] Preferred lab    Send INR reminders to [facility] IMP   Comments        Provider Role Specialty Phone number   Cliffton Asters, MD  Infectious Diseases [phone]        ANTICOAG TODAY: Anticoagulation Summary as of 01/23/2011              INR goal 2.0-3.0     Selected INR 2.5 (01/23/2011) Next INR check 02/20/2011   Weekly max dose (mg)  Target end date Indefinite   Indications EMBOLISM AND THROMBOSIS, ARTERY, Long term current use of anticoagulant    Anticoagulation Episode Summary              INR check location [facility] Preferred lab    Send INR reminders to [facility] IMP   Comments        Provider Role Specialty Phone number   Cliffton Asters, MD  Infectious Diseases [phone]        PATIENT INSTRUCTIONS: Patient Instructions  Patient instructed to take medications as defined in the Anti-coagulation Track section of this encounter.  Patient instructed to take today's dose.  Patient verbalized understanding of these instructions.        FOLLOW-UP Return in 4 weeks (on  02/20/2011) for Follow up INR.  [name], [name] Pharm.D., CACP

## 2011-01-23 NOTE — Patient Instructions (Signed)
Patient instructed to take medications as defined in the Anti-coagulation Track section of this encounter.  Patient instructed to take today's dose.  Patient verbalized understanding of these instructions.    

## 2011-01-26 NOTE — Progress Notes (Signed)
Summary: pt. requesting increased in oxy ir pain rx  Phone Note Call from Patient Call back at [facility] Phone [phone]   Caller: Patient Reason for Call: Talk to Nurse Summary of Call: Pt. states that he is taking the breakthrough pain rx 3 times a day now.  The rx is only for 2 times a day.  Pt. requesting that rx be changed 3 times a day as needed.  RN told the pt. she would contact Dr. Orvan Falconer about this request. Jennet Maduro RN  January 10, 2011 2:45 PM   Follow-up for Phone Call        Please change script to three times a day as needed and fill #[id number]. Follow-up by: [name],  January 17, 2011 9:02 AM    New/Updated Medications: OXYCODONE HCL 5 MG TABS (OXYCODONE HCL) Take 1 tablet by mouth three times a day

## 2011-01-26 NOTE — Progress Notes (Signed)
  Phone Note Call from Patient   Caller: Patient Summary of Call: pt lm asking for refill on his ms contin 60mg . I called him back & stated he found the rx in his truck & got it filled. no need for rx now Initial call taken by: Golden Circle RN,  January 16, 2011 4:10 PM

## 2011-02-01 LAB — T-HELPER CELL (CD4) - (RCID CLINIC ONLY)
CD4 % Helper T Cell: 17 % — ABNORMAL LOW (ref 33–55)
CD4 T Cell Abs: 720 uL (ref 400–2700)

## 2011-02-03 LAB — CBC
HCT: 29.1 % — ABNORMAL LOW (ref 39.0–52.0)
HCT: 29.8 % — ABNORMAL LOW (ref 39.0–52.0)
HCT: 30.9 % — ABNORMAL LOW (ref 39.0–52.0)
HCT: 33.1 % — ABNORMAL LOW (ref 39.0–52.0)
HCT: 34.8 % — ABNORMAL LOW (ref 39.0–52.0)
HCT: 35.8 % — ABNORMAL LOW (ref 39.0–52.0)
HCT: 36.1 % — ABNORMAL LOW (ref 39.0–52.0)
Hemoglobin: 13.6 g/dL (ref 13.0–17.0)
Hemoglobin: 10 g/dL — ABNORMAL LOW (ref 13.0–17.0)
Hemoglobin: 10.2 g/dL — ABNORMAL LOW (ref 13.0–17.0)
Hemoglobin: 10.3 g/dL — ABNORMAL LOW (ref 13.0–17.0)
Hemoglobin: 11.5 g/dL — ABNORMAL LOW (ref 13.0–17.0)
Hemoglobin: 11.7 g/dL — ABNORMAL LOW (ref 13.0–17.0)
Hemoglobin: 12.2 g/dL — ABNORMAL LOW (ref 13.0–17.0)
Hemoglobin: 12.9 g/dL — ABNORMAL LOW (ref 13.0–17.0)
Hemoglobin: 9 g/dL — ABNORMAL LOW (ref 13.0–17.0)
Hemoglobin: 9.8 g/dL — ABNORMAL LOW (ref 13.0–17.0)
MCHC: 33.5 g/dL (ref 30.0–36.0)
MCHC: 33.7 g/dL (ref 30.0–36.0)
MCHC: 32.7 g/dL (ref 30.0–36.0)
MCHC: 33 g/dL (ref 30.0–36.0)
MCHC: 33.1 g/dL (ref 30.0–36.0)
MCHC: 33.4 g/dL (ref 30.0–36.0)
MCHC: 33.5 g/dL (ref 30.0–36.0)
MCHC: 33.7 g/dL (ref 30.0–36.0)
MCHC: 33.7 g/dL (ref 30.0–36.0)
MCHC: 34 g/dL (ref 30.0–36.0)
MCV: 112.7 fL — ABNORMAL HIGH (ref 78.0–100.0)
MCV: 108.8 fL — ABNORMAL HIGH (ref 78.0–100.0)
MCV: 109.5 fL — ABNORMAL HIGH (ref 78.0–100.0)
MCV: 109.9 fL — ABNORMAL HIGH (ref 78.0–100.0)
MCV: 110.8 fL — ABNORMAL HIGH (ref 78.0–100.0)
MCV: 111.4 fL — ABNORMAL HIGH (ref 78.0–100.0)
MCV: 111.9 fL — ABNORMAL HIGH (ref 78.0–100.0)
MCV: 112.2 fL — ABNORMAL HIGH (ref 78.0–100.0)
MCV: 113.2 fL — ABNORMAL HIGH (ref 78.0–100.0)
MCV: 113.5 fL — ABNORMAL HIGH (ref 78.0–100.0)
Platelets: 321 10*3/uL (ref 150–400)
Platelets: 342 10*3/uL (ref 150–400)
Platelets: 436 10*3/uL — ABNORMAL HIGH (ref 150–400)
Platelets: 466 10*3/uL — ABNORMAL HIGH (ref 150–400)
Platelets: 517 10*3/uL — ABNORMAL HIGH (ref 150–400)
Platelets: 667 10*3/uL — ABNORMAL HIGH (ref 150–400)
Platelets: 708 10*3/uL — ABNORMAL HIGH (ref 150–400)
Platelets: 846 10*3/uL — ABNORMAL HIGH (ref 150–400)
Platelets: 869 10*3/uL — ABNORMAL HIGH (ref 150–400)
RBC: 3.65 MIL/uL — ABNORMAL LOW (ref 4.22–5.81)
RBC: 2.48 MIL/uL — ABNORMAL LOW (ref 4.22–5.81)
RBC: 2.81 MIL/uL — ABNORMAL LOW (ref 4.22–5.81)
RBC: 2.81 MIL/uL — ABNORMAL LOW (ref 4.22–5.81)
RBC: 3.14 MIL/uL — ABNORMAL LOW (ref 4.22–5.81)
RBC: 3.19 MIL/uL — ABNORMAL LOW (ref 4.22–5.81)
RBC: 3.44 MIL/uL — ABNORMAL LOW (ref 4.22–5.81)
RDW: 21.5 % — ABNORMAL HIGH (ref 11.5–15.5)
RDW: 21.6 % — ABNORMAL HIGH (ref 11.5–15.5)
RDW: 22.4 % — ABNORMAL HIGH (ref 11.5–15.5)
RDW: 16.5 % — ABNORMAL HIGH (ref 11.5–15.5)
RDW: 19.1 % — ABNORMAL HIGH (ref 11.5–15.5)
RDW: 19.5 % — ABNORMAL HIGH (ref 11.5–15.5)
RDW: 19.9 % — ABNORMAL HIGH (ref 11.5–15.5)
RDW: 20.5 % — ABNORMAL HIGH (ref 11.5–15.5)
RDW: 21.6 % — ABNORMAL HIGH (ref 11.5–15.5)
RDW: 21.8 % — ABNORMAL HIGH (ref 11.5–15.5)
RDW: 22.2 % — ABNORMAL HIGH (ref 11.5–15.5)
RDW: 22.2 % — ABNORMAL HIGH (ref 11.5–15.5)
WBC: 33 10*3/uL — ABNORMAL HIGH (ref 4.0–10.5)
WBC: 19.6 10*3/uL — ABNORMAL HIGH (ref 4.0–10.5)
WBC: 22.1 10*3/uL — ABNORMAL HIGH (ref 4.0–10.5)
WBC: 23.5 10*3/uL — ABNORMAL HIGH (ref 4.0–10.5)
WBC: 25.3 10*3/uL — ABNORMAL HIGH (ref 4.0–10.5)
WBC: 26.2 10*3/uL — ABNORMAL HIGH (ref 4.0–10.5)
WBC: 33.5 10*3/uL — ABNORMAL HIGH (ref 4.0–10.5)

## 2011-02-03 LAB — BASIC METABOLIC PANEL
BUN: 7 mg/dL (ref 6–23)
BUN: 4 mg/dL — ABNORMAL LOW (ref 6–23)
CO2: 28 mEq/L (ref 19–32)
CO2: 22 mEq/L (ref 19–32)
CO2: 27 mEq/L (ref 19–32)
CO2: 30 mEq/L (ref 19–32)
Calcium: 9.4 mg/dL (ref 8.4–10.5)
Calcium: 8.9 mg/dL (ref 8.4–10.5)
Calcium: 9.3 mg/dL (ref 8.4–10.5)
Calcium: 9.4 mg/dL (ref 8.4–10.5)
Calcium: 9.8 mg/dL (ref 8.4–10.5)
Chloride: 99 mEq/L (ref 96–112)
Chloride: 91 mEq/L — ABNORMAL LOW (ref 96–112)
Chloride: 93 mEq/L — ABNORMAL LOW (ref 96–112)
Chloride: 96 mEq/L (ref 96–112)
Chloride: 96 mEq/L (ref 96–112)
Creatinine, Ser: 0.82 mg/dL (ref 0.4–1.5)
Creatinine, Ser: 0.65 mg/dL (ref 0.4–1.5)
GFR calc Af Amer: >60 mL/min (ref >60)
GFR calc Af Amer: >60 mL/min (ref >60)
GFR calc Af Amer: >60 mL/min (ref >60)
GFR calc Af Amer: >60 mL/min (ref >60)
GFR calc Af Amer: >60 mL/min (ref >60)
GFR calc non Af Amer: >60 mL/min (ref >60)
GFR calc non Af Amer: >60 mL/min (ref >60)
GFR calc non Af Amer: >60 mL/min (ref >60)
Glucose, Bld: 125 mg/dL — ABNORMAL HIGH (ref 70–99)
Glucose, Bld: 135 mg/dL — ABNORMAL HIGH (ref 70–99)
Glucose, Bld: 147 mg/dL — ABNORMAL HIGH (ref 70–99)
Glucose, Bld: 176 mg/dL — ABNORMAL HIGH (ref 70–99)
Potassium: 4 mEq/L (ref 3.5–5.1)
Potassium: 4.2 mEq/L (ref 3.5–5.1)
Potassium: 4.5 mEq/L (ref 3.5–5.1)
Potassium: 4.6 mEq/L (ref 3.5–5.1)
Potassium: 4.7 mEq/L (ref 3.5–5.1)
Sodium: 129 mEq/L — ABNORMAL LOW (ref 135–145)
Sodium: 129 mEq/L — ABNORMAL LOW (ref 135–145)
Sodium: 130 mEq/L — ABNORMAL LOW (ref 135–145)
Sodium: 130 mEq/L — ABNORMAL LOW (ref 135–145)
Sodium: 132 mEq/L — ABNORMAL LOW (ref 135–145)
Sodium: 135 mEq/L (ref 135–145)

## 2011-02-03 LAB — PROTIME-INR
INR: 1.2 (ref 0.00–1.49)
INR: 1.6 — ABNORMAL HIGH (ref 0.00–1.49)
INR: 1.6 — ABNORMAL HIGH (ref 0.00–1.49)
INR: 1 (ref 0.00–1.49)
INR: 1.2 (ref 0.00–1.49)
INR: 1.3 (ref 0.00–1.49)
INR: 1.5 (ref 0.00–1.49)
INR: 1.5 (ref 0.00–1.49)
Prothrombin Time: 15.1 seconds (ref 11.6–15.2)
Prothrombin Time: 18.5 seconds — ABNORMAL HIGH (ref 11.6–15.2)
Prothrombin Time: 19.1 seconds — ABNORMAL HIGH (ref 11.6–15.2)
Prothrombin Time: 13.8 seconds (ref 11.6–15.2)
Prothrombin Time: 14.6 seconds (ref 11.6–15.2)
Prothrombin Time: 17.9 seconds — ABNORMAL HIGH (ref 11.6–15.2)
Prothrombin Time: 18.1 seconds — ABNORMAL HIGH (ref 11.6–15.2)

## 2011-02-03 LAB — COMPREHENSIVE METABOLIC PANEL
AST: 31 U/L (ref 0–37)
Albumin: 3.1 g/dL — ABNORMAL LOW (ref 3.5–5.2)
BUN: 8 mg/dL (ref 6–23)
Calcium: 9 mg/dL (ref 8.4–10.5)
Chloride: 96 mEq/L (ref 96–112)
Creatinine, Ser: 0.77 mg/dL (ref 0.4–1.5)
GFR calc Af Amer: >60 mL/min (ref >60)
GFR calc non Af Amer: >60 mL/min (ref >60)
Total Bilirubin: 0.7 mg/dL (ref 0.3–1.2)

## 2011-02-03 LAB — HEPARIN LEVEL (UNFRACTIONATED)
Heparin Unfractionated: 0.56 IU/mL (ref 0.30–0.70)
Heparin Unfractionated: 0.34 IU/mL (ref 0.30–0.70)
Heparin Unfractionated: 0.42 IU/mL (ref 0.30–0.70)
Heparin Unfractionated: 0.43 IU/mL (ref 0.30–0.70)
Heparin Unfractionated: 0.5 IU/mL (ref 0.30–0.70)
Heparin Unfractionated: 0.54 IU/mL (ref 0.30–0.70)
Heparin Unfractionated: 0.7 IU/mL (ref 0.30–0.70)
Heparin Unfractionated: 0.7 IU/mL (ref 0.30–0.70)
Heparin Unfractionated: 0.75 IU/mL — ABNORMAL HIGH (ref 0.30–0.70)
Heparin Unfractionated: 0.83 IU/mL — ABNORMAL HIGH (ref 0.30–0.70)
Heparin Unfractionated: 0.94 IU/mL — ABNORMAL HIGH (ref 0.30–0.70)
Heparin Unfractionated: 1.06 IU/mL — ABNORMAL HIGH (ref 0.30–0.70)
Heparin Unfractionated: 1.23 IU/mL — ABNORMAL HIGH (ref 0.30–0.70)

## 2011-02-03 LAB — DIFFERENTIAL
Basophils Absolute: 0 10*3/uL (ref 0.0–0.1)
Basophils Relative: 0 % (ref 0–1)
Basophils Relative: 0 % (ref 0–1)
Basophils Relative: 1 % (ref 0–1)
Eosinophils Absolute: 0 10*3/uL (ref 0.0–0.7)
Eosinophils Absolute: 0.1 10*3/uL (ref 0.0–0.7)
Eosinophils Relative: 0 % (ref 0–5)
Eosinophils Relative: 0 % (ref 0–5)
Eosinophils Relative: 0 % (ref 0–5)
Lymphocytes Relative: 22 % (ref 12–46)
Lymphocytes Relative: 17 % (ref 12–46)
Lymphs Abs: 5.6 10*3/uL — ABNORMAL HIGH (ref 0.7–4.0)
Monocytes Absolute: 1.7 10*3/uL — ABNORMAL HIGH (ref 0.1–1.0)
Monocytes Relative: 5 % (ref 3–12)
Monocytes Relative: 7 % (ref 3–12)
Monocytes Relative: 8 % (ref 3–12)
Neutro Abs: 18.9 10*3/uL — ABNORMAL HIGH (ref 1.7–7.7)
Neutrophils Relative %: 76 % (ref 43–77)

## 2011-02-03 LAB — T-HELPER CELL (CD4) - (RCID CLINIC ONLY): CD4 % Helper T Cell: 22 % — ABNORMAL LOW (ref 33–55)

## 2011-02-03 LAB — WOUND CULTURE

## 2011-02-03 LAB — CARDIOLIPIN ANTIBODIES, IGG, IGM, IGA
Anticardiolipin IgA: <10 APL U/mL — ABNORMAL LOW (ref <10)
Anticardiolipin IgG: <10 GPL U/mL — ABNORMAL LOW (ref <10)
Anticardiolipin IgM: <10 MPL U/mL — ABNORMAL LOW (ref <10)

## 2011-02-03 LAB — C-REACTIVE PROTEIN: CRP: 0.1 mg/dL — ABNORMAL LOW (ref <0.6)

## 2011-02-03 LAB — JAK2 GENOTYPR: JAK2 GenotypR: NOT DETECTED

## 2011-02-03 LAB — HEPATITIS A ANTIBODY, TOTAL: Hep A Total Ab: NEGATIVE

## 2011-02-03 LAB — GC/CHLAMYDIA PROBE AMP, URINE
Chlamydia, Swab/Urine, PCR: NEGATIVE
GC Probe Amp, Urine: NEGATIVE

## 2011-02-04 LAB — T-HELPER CELLS (CD4) COUNT (NOT AT ARMC)
CD4 % Helper T Cell: 28 % — ABNORMAL LOW (ref 33–55)
CD4 T Cell Abs: 870 uL (ref 400–2700)

## 2011-02-04 LAB — CBC
HCT: 27.6 % — ABNORMAL LOW (ref 39.0–52.0)
HCT: 30.5 % — ABNORMAL LOW (ref 39.0–52.0)
HCT: 31.8 % — ABNORMAL LOW (ref 39.0–52.0)
HCT: 33.9 % — ABNORMAL LOW (ref 39.0–52.0)
MCHC: 33.2 g/dL (ref 30.0–36.0)
MCHC: 33.4 g/dL (ref 30.0–36.0)
MCHC: 33.6 g/dL (ref 30.0–36.0)
MCHC: 33.9 g/dL (ref 30.0–36.0)
MCV: 108.1 fL — ABNORMAL HIGH (ref 78.0–100.0)
MCV: 108.3 fL — ABNORMAL HIGH (ref 78.0–100.0)
MCV: 108.6 fL — ABNORMAL HIGH (ref 78.0–100.0)
MCV: 109.3 fL — ABNORMAL HIGH (ref 78.0–100.0)
Platelets: 575 10*3/uL — ABNORMAL HIGH (ref 150–400)
Platelets: 584 10*3/uL — ABNORMAL HIGH (ref 150–400)
Platelets: 601 10*3/uL — ABNORMAL HIGH (ref 150–400)
Platelets: 606 10*3/uL — ABNORMAL HIGH (ref 150–400)
Platelets: 670 10*3/uL — ABNORMAL HIGH (ref 150–400)
Platelets: 681 10*3/uL — ABNORMAL HIGH (ref 150–400)
RBC: 2.61 MIL/uL — ABNORMAL LOW (ref 4.22–5.81)
RBC: 2.82 MIL/uL — ABNORMAL LOW (ref 4.22–5.81)
RBC: 2.9 MIL/uL — ABNORMAL LOW (ref 4.22–5.81)
RBC: 2.95 MIL/uL — ABNORMAL LOW (ref 4.22–5.81)
RDW: 15.5 % (ref 11.5–15.5)
RDW: 15.7 % — ABNORMAL HIGH (ref 11.5–15.5)
RDW: 16.1 % — ABNORMAL HIGH (ref 11.5–15.5)
RDW: 16.4 % — ABNORMAL HIGH (ref 11.5–15.5)
WBC: 18.1 10*3/uL — ABNORMAL HIGH (ref 4.0–10.5)
WBC: 20.5 10*3/uL — ABNORMAL HIGH (ref 4.0–10.5)
WBC: 21.6 10*3/uL — ABNORMAL HIGH (ref 4.0–10.5)
WBC: 21.9 10*3/uL — ABNORMAL HIGH (ref 4.0–10.5)
WBC: 22.9 10*3/uL — ABNORMAL HIGH (ref 4.0–10.5)
WBC: 25.5 10*3/uL — ABNORMAL HIGH (ref 4.0–10.5)

## 2011-02-04 LAB — CULTURE, BLOOD (ROUTINE X 2)
Culture: NO GROWTH
Culture: NO GROWTH
Culture: NO GROWTH

## 2011-02-04 LAB — BASIC METABOLIC PANEL
BUN: 3 mg/dL — ABNORMAL LOW (ref 6–23)
BUN: 3 mg/dL — ABNORMAL LOW (ref 6–23)
BUN: 4 mg/dL — ABNORMAL LOW (ref 6–23)
BUN: 4 mg/dL — ABNORMAL LOW (ref 6–23)
Calcium: 8.3 mg/dL — ABNORMAL LOW (ref 8.4–10.5)
Calcium: 8.4 mg/dL (ref 8.4–10.5)
Calcium: 9 mg/dL (ref 8.4–10.5)
Chloride: 100 mEq/L (ref 96–112)
Chloride: 90 mEq/L — ABNORMAL LOW (ref 96–112)
Chloride: 99 mEq/L (ref 96–112)
Creatinine, Ser: 0.48 mg/dL (ref 0.4–1.5)
Creatinine, Ser: 0.63 mg/dL (ref 0.4–1.5)
GFR calc Af Amer: >60 mL/min (ref >60)
GFR calc Af Amer: >60 mL/min (ref >60)
GFR calc Af Amer: >60 mL/min (ref >60)
GFR calc Af Amer: >60 mL/min (ref >60)
GFR calc non Af Amer: >60 mL/min (ref >60)
GFR calc non Af Amer: >60 mL/min (ref >60)
GFR calc non Af Amer: >60 mL/min (ref >60)
Glucose, Bld: 117 mg/dL — ABNORMAL HIGH (ref 70–99)
Potassium: 3.7 mEq/L (ref 3.5–5.1)
Potassium: 3.9 mEq/L (ref 3.5–5.1)
Sodium: 126 mEq/L — ABNORMAL LOW (ref 135–145)
Sodium: 131 mEq/L — ABNORMAL LOW (ref 135–145)

## 2011-02-04 LAB — LIPID PANEL
Cholesterol: 120 mg/dL (ref 0–200)
HDL: <10 mg/dL — ABNORMAL LOW (ref >39)
Triglycerides: 71 mg/dL (ref <150)
VLDL: 14 mg/dL (ref 0–40)

## 2011-02-04 LAB — TSH: TSH: 1.441 u[IU]/mL (ref 0.350–4.500)

## 2011-02-04 LAB — DIFFERENTIAL
Basophils Relative: 1 % (ref 0–1)
Lymphocytes Relative: 10 % — ABNORMAL LOW (ref 12–46)
Lymphs Abs: 1.8 10*3/uL (ref 0.7–4.0)
Monocytes Absolute: 1.1 10*3/uL — ABNORMAL HIGH (ref 0.1–1.0)
Monocytes Relative: 6 % (ref 3–12)
Neutro Abs: 14.7 10*3/uL — ABNORMAL HIGH (ref 1.7–7.7)

## 2011-02-04 LAB — HEPARIN LEVEL (UNFRACTIONATED)
Heparin Unfractionated: 0.11 IU/mL — ABNORMAL LOW (ref 0.30–0.70)
Heparin Unfractionated: 0.12 IU/mL — ABNORMAL LOW (ref 0.30–0.70)
Heparin Unfractionated: 0.12 IU/mL — ABNORMAL LOW (ref 0.30–0.70)
Heparin Unfractionated: <0.1 IU/mL — ABNORMAL LOW (ref 0.30–0.70)
Heparin Unfractionated: <0.1 IU/mL — ABNORMAL LOW (ref 0.30–0.70)
Heparin Unfractionated: <0.1 IU/mL — ABNORMAL LOW (ref 0.30–0.70)

## 2011-02-04 LAB — URINE DRUGS OF ABUSE SCREEN W ALC, ROUTINE (REF LAB)
Benzodiazepines.: NEGATIVE
Creatinine,U: 87.1 mg/dL
Ethyl Alcohol: <10 mg/dL (ref <10)
Opiate Screen, Urine: POSITIVE — AB
Phencyclidine (PCP): NEGATIVE

## 2011-02-04 LAB — BETA-2-GLYCOPROTEIN I ABS, IGG/M/A
Beta-2 Glyco I IgG: <20 U/mL (ref <20)
Beta-2-Glycoprotein I IgA: <10 U/mL (ref <10)

## 2011-02-04 LAB — HEPATIC FUNCTION PANEL
ALT: 110 U/L — ABNORMAL HIGH (ref 0–53)
Bilirubin, Direct: 0.1 mg/dL (ref 0.0–0.3)
Indirect Bilirubin: 0.5 mg/dL (ref 0.3–0.9)

## 2011-02-04 LAB — OPIATE, QUANTITATIVE, URINE
Codeine Urine: NEGATIVE ng/mL
Hydromorphone GC/MS Conf: NEGATIVE ng/mL
Oxycodone, ur: 2320 ng/mL

## 2011-02-04 LAB — FACTOR 5 LEIDEN

## 2011-02-04 LAB — COMPREHENSIVE METABOLIC PANEL
Albumin: 2.5 g/dL — ABNORMAL LOW (ref 3.5–5.2)
Alkaline Phosphatase: 88 U/L (ref 39–117)
BUN: 4 mg/dL — ABNORMAL LOW (ref 6–23)
Potassium: 3.4 mEq/L — ABNORMAL LOW (ref 3.5–5.1)
Total Protein: 8.7 g/dL — ABNORMAL HIGH (ref 6.0–8.3)

## 2011-02-04 LAB — GLUCOSE, CAPILLARY: Glucose-Capillary: 90 mg/dL (ref 70–99)

## 2011-02-04 LAB — SODIUM, URINE, RANDOM: Sodium, Ur: 65 mEq/L

## 2011-02-04 LAB — HEPATITIS PANEL, ACUTE
HCV Ab: NEGATIVE
Hep B C IgM: NEGATIVE
Hepatitis B Surface Ag: NEGATIVE

## 2011-02-04 LAB — PROTEIN C, TOTAL: Protein C, Total: 77 % (ref 70–140)

## 2011-02-04 LAB — CD4/CD8 (T-HELPER/T-SUPPRESSOR CELL)
CD4 absolute: 630 /uL (ref 500–1900)
CD4%: 20 % — ABNORMAL LOW (ref 30.0–60.0)
CD8tox: 40 % (ref 15.0–40.0)
Total lymphocyte count: 3120 /uL (ref 1000–4000)

## 2011-02-04 LAB — CK: Total CK: 624 U/L — ABNORMAL HIGH (ref 7–232)

## 2011-02-04 LAB — HIV 1/2 CONFIRMATION
HIV-1 antibody: POSITIVE
HIV-2 Ab: UNDETERMINED

## 2011-02-04 LAB — MAGNESIUM: Magnesium: 2.3 mg/dL (ref 1.5–2.5)

## 2011-02-04 LAB — OSMOLALITY: Osmolality: 267 mOsm/kg — ABNORMAL LOW (ref 275–300)

## 2011-02-04 LAB — TESTOSTERONE, FREE: Testosterone, Free: 3.4 pg/mL — ABNORMAL LOW (ref 47.0–244.0)

## 2011-02-04 LAB — CARDIOLIPIN ANTIBODIES, IGG, IGM, IGA
Anticardiolipin IgG: <10 GPL U/mL — ABNORMAL LOW (ref <10)
Anticardiolipin IgM: <10 MPL U/mL — ABNORMAL LOW (ref <10)

## 2011-02-04 LAB — SEX HORMONE BINDING GLOBULIN: Sex Hormone Binding: 35 nmol/L (ref 13–71)

## 2011-02-04 LAB — WOUND CULTURE

## 2011-02-04 LAB — LUPUS ANTICOAGULANT PANEL
Drvvt confirmation: 1.22 Ratio — ABNORMAL HIGH (ref <1.18)
PTT Lupus Anticoagulant: 55.1 secs — ABNORMAL HIGH (ref 36.3–48.8)

## 2011-02-04 LAB — HOMOCYSTEINE: Homocysteine: 6.6 umol/L (ref 4.0–15.4)

## 2011-02-04 LAB — CREATININE, URINE, RANDOM: Creatinine, Urine: 92.4 mg/dL

## 2011-02-04 LAB — PROTEIN S, TOTAL: Protein S Ag, Total: 143 % — ABNORMAL HIGH (ref 70–140)

## 2011-02-04 LAB — SEDIMENTATION RATE: Sed Rate: 130 mm/hr — ABNORMAL HIGH (ref 0–16)

## 2011-02-04 LAB — VANCOMYCIN, TROUGH: Vancomycin Tr: <5 ug/mL — ABNORMAL LOW (ref 10.0–20.0)

## 2011-02-04 LAB — ANTITHROMBIN III: AntiThromb III Func: 133 % — ABNORMAL HIGH (ref 76–126)

## 2011-02-04 LAB — COLD AGGLUTININ TITER: Cold Agglutinin Titer: <1:32 {titer}

## 2011-02-04 LAB — VITAMIN D 1,25 DIHYDROXY: Vitamin D3 1, 25 (OH)2: 27 pg/mL

## 2011-02-04 LAB — APTT: aPTT: 37 seconds (ref 24–37)

## 2011-02-04 LAB — PROTIME-INR: Prothrombin Time: 15.4 seconds — ABNORMAL HIGH (ref 11.6–15.2)

## 2011-02-04 LAB — HIV ANTIBODY (ROUTINE TESTING W REFLEX): HIV: REACTIVE — AB

## 2011-02-13 ENCOUNTER — Other Ambulatory Visit: Payer: Self-pay | Admitting: Internal Medicine

## 2011-02-13 ENCOUNTER — Ambulatory Visit (INDEPENDENT_AMBULATORY_CARE_PROVIDER_SITE_OTHER): Payer: Medicare Other

## 2011-02-13 DIAGNOSIS — G547 Phantom limb syndrome without pain: Secondary | ICD-10-CM

## 2011-02-13 DIAGNOSIS — Z79899 Other long term (current) drug therapy: Secondary | ICD-10-CM

## 2011-02-13 DIAGNOSIS — B20 Human immunodeficiency virus [HIV] disease: Secondary | ICD-10-CM

## 2011-02-13 MED ORDER — MORPHINE SULFATE CR 15 MG PO TB12: 15.0000 mg | ORAL_TABLET | Freq: Two times a day (BID) | ORAL | Status: DC

## 2011-02-13 MED ORDER — OXYCODONE HCL 5 MG PO TABA: 1.0000 | ORAL_TABLET | Freq: Three times a day (TID) | ORAL | Status: DC

## 2011-02-13 MED ORDER — MORPHINE SULFATE CR 60 MG PO TB12: 60.0000 mg | ORAL_TABLET | Freq: Two times a day (BID) | ORAL | Status: DC

## 2011-02-14 ENCOUNTER — Other Ambulatory Visit: Payer: Self-pay | Admitting: Licensed Clinical Social Worker

## 2011-02-14 DIAGNOSIS — G8929 Other chronic pain: Secondary | ICD-10-CM

## 2011-02-14 LAB — T-HELPER CELL (CD4) - (RCID CLINIC ONLY)
CD4 % Helper T Cell: 21 % — ABNORMAL LOW (ref 33–55)
CD4 % Helper T Cell: 26 % — ABNORMAL LOW (ref 33–55)
CD4 T Cell Abs: 1710 uL (ref 400–2700)
CD4 T Cell Abs: 980 uL (ref 400–2700)

## 2011-02-14 LAB — HIV-1 RNA QUANT-NO REFLEX-BLD: HIV 1 RNA Quant: 119 copies/mL — ABNORMAL HIGH (ref <20)

## 2011-02-14 LAB — LIPID PANEL: LDL Cholesterol: 105 mg/dL — ABNORMAL HIGH (ref 0–99)

## 2011-02-14 MED ORDER — OXYCODONE HCL 5 MG PO TABS: 5.0000 mg | ORAL_TABLET | Freq: Two times a day (BID) | ORAL | Status: DC

## 2011-02-20 ENCOUNTER — Ambulatory Visit (INDEPENDENT_AMBULATORY_CARE_PROVIDER_SITE_OTHER): Payer: Medicare Other | Admitting: Pharmacist

## 2011-02-20 DIAGNOSIS — I749 Embolism and thrombosis of unspecified artery: Secondary | ICD-10-CM

## 2011-02-20 DIAGNOSIS — Z7901 Long term (current) use of anticoagulants: Secondary | ICD-10-CM

## 2011-02-20 NOTE — Patient Instructions (Signed)
Patient instructed to take medications as defined in the Anti-coagulation Track section of this encounter.  Patient instructed to OMIT today's dose.  Patient verbalized understanding of these instructions.    

## 2011-02-20 NOTE — Progress Notes (Signed)
Anti-Coagulation Progress Note  [name] is a 50 y.o. male who is currently on an anti-coagulation regimen.    RECENT RESULTS: Recent results are below, the most recent result is correlated with a dose of 62.5 mg. per week: Lab Results  Component Value Date   INR 4.0 02/20/2011   INR 2.5 01/23/2011   INR 3.5 01/10/2011   PROTIME 39.6* 03/24/2010    ANTI-COAG DOSE:   Latest dosing instructions   Total Sun Mon Tue Wed Thu Fri Sat   57.5 10 mg 7.5 mg 7.5 mg 10 mg 7.5 mg 7.5 mg 7.5 mg    (5 mg2) (5 mg1.5) (5 mg1.5) (5 mg2) (5 mg1.5) (5 mg1.5) (5 mg1.5)         ANTICOAG SUMMARY: Anticoagulation Episode Summary              Current INR goal 2.0-3.0 Next INR check 03/20/2011   INR from last check 4.0! (02/20/2011)     Weekly max dose (mg)  Target end date Indefinite   Indications EMBOLISM AND THROMBOSIS, ARTERY, Long term current use of anticoagulant   INR check location [facility] Preferred lab    Send INR reminders to [facility] IMP   Comments        Provider Role Specialty Phone number   Cliffton Asters, MD  Infectious Diseases [phone]        ANTICOAG TODAY: Anticoagulation Summary as of 02/20/2011              INR goal 2.0-3.0     Selected INR 4.0! (02/20/2011) Next INR check 03/20/2011   Weekly max dose (mg)  Target end date Indefinite   Indications EMBOLISM AND THROMBOSIS, ARTERY, Long term current use of anticoagulant    Anticoagulation Episode Summary              INR check location [facility] Preferred lab    Send INR reminders to [facility] IMP   Comments        Provider Role Specialty Phone number   Cliffton Asters, MD  Infectious Diseases [phone]        PATIENT INSTRUCTIONS: Patient Instructions  Patient instructed to take medications as defined in the Anti-coagulation Track section of this encounter.  Patient instructed to OMIT today's dose.  Patient verbalized understanding of these instructions.        FOLLOW-UP Return in 4  weeks (on 03/20/2011) for Follow up INR.  [name], [name] Pharm.D., CACP

## 2011-02-27 ENCOUNTER — Ambulatory Visit (INDEPENDENT_AMBULATORY_CARE_PROVIDER_SITE_OTHER): Payer: Medicare Other | Admitting: Internal Medicine

## 2011-02-27 ENCOUNTER — Encounter: Payer: Self-pay | Admitting: Internal Medicine

## 2011-02-27 VITALS — BP 127/72 | HR 86 | Temp 97.7°F | Ht 72.0 in | Wt 145.2 lb

## 2011-02-27 DIAGNOSIS — G8929 Other chronic pain: Secondary | ICD-10-CM

## 2011-02-27 DIAGNOSIS — E785 Hyperlipidemia, unspecified: Secondary | ICD-10-CM

## 2011-02-27 DIAGNOSIS — B20 Human immunodeficiency virus [HIV] disease: Secondary | ICD-10-CM

## 2011-02-27 DIAGNOSIS — G547 Phantom limb syndrome without pain: Secondary | ICD-10-CM

## 2011-02-27 MED ORDER — MORPHINE SULFATE CR 30 MG PO TB12: 30.0000 mg | ORAL_TABLET | Freq: Two times a day (BID) | ORAL | Status: DC

## 2011-02-27 NOTE — Progress Notes (Signed)
  Subjective:    Patient ID: [name], male    DOB: Jan 23, 1961, 50 y.o.   MRN: [id number]  HPI Lior is in for his routine visit. He has a new prosthetic leg that he is having trouble using because of increased leg pain. He does not have any stump breakdown his leg just hurts more, even when the prosthesis is not on. He has not missed any doses of his meds. He is helping Aurea Graff manage with terminal cancer.They have been told that there is nothing more to do for her cancer. He denies feeling depressed.    Review of Systems     Objective:   Physical Exam  Constitutional: He appears well-developed and well-nourished. No distress.  HENT:  Mouth/Throat: Oropharynx is clear and moist. No oropharyngeal exudate.  Eyes: Scleral icterus is present.  Cardiovascular: Normal rate, regular rhythm and normal heart sounds.   No murmur heard. Pulmonary/Chest: He has wheezes. He has no rales.  Musculoskeletal:       No problems with his stump.  Psychiatric: He has a normal mood and affect.          Assessment & Plan:

## 2011-02-27 NOTE — Assessment & Plan Note (Signed)
I will increase his MS Contin. He is probably developing tolerance to his current dose.

## 2011-02-27 NOTE — Assessment & Plan Note (Signed)
His lipids have improved. 

## 2011-02-27 NOTE — Assessment & Plan Note (Addendum)
Quavion's infection remains under good control. He wants to know if he is a candidate for Complera. He had poor compliance with efavarenz and had developed NNRTI resistance in 2002 so he is not a good candidate for switching. I will continue his current regimen.

## 2011-03-06 ENCOUNTER — Other Ambulatory Visit: Payer: Self-pay | Admitting: Internal Medicine

## 2011-03-06 DIAGNOSIS — G629 Polyneuropathy, unspecified: Secondary | ICD-10-CM

## 2011-03-14 ENCOUNTER — Other Ambulatory Visit: Payer: Self-pay | Admitting: *Deleted

## 2011-03-14 DIAGNOSIS — G547 Phantom limb syndrome without pain: Secondary | ICD-10-CM

## 2011-03-14 DIAGNOSIS — G8929 Other chronic pain: Secondary | ICD-10-CM

## 2011-03-14 MED ORDER — MORPHINE SULFATE CR 60 MG PO TB12: 60.0000 mg | ORAL_TABLET | Freq: Two times a day (BID) | ORAL | Status: DC

## 2011-03-14 MED ORDER — MORPHINE SULFATE CR 30 MG PO TB12: 30.0000 mg | ORAL_TABLET | Freq: Two times a day (BID) | ORAL | Status: DC

## 2011-03-14 MED ORDER — OXYCODONE HCL 5 MG PO TABS: 5.0000 mg | ORAL_TABLET | Freq: Two times a day (BID) | ORAL | Status: DC

## 2011-03-14 NOTE — Consult Note (Signed)
[name], [name] NO.:  [id number]   MEDICAL RECORD NO.:  [id number]          PATIENT TYPE:  INP   LOCATION:  5022                         FACILITY:  [facility]   PHYSICIAN:  Pierce Crane, MD        DATE OF BIRTH:  08-18-1961   DATE OF CONSULTATION:  DATE OF DISCHARGE:                                 CONSULTATION   REQUESTING PHYSICIAN:  Dr. Daiva Eves   REASON FOR CONSULTATION:  1. Lung mass.  2. Rule out hypercoagulability.   HISTORY OF PRESENT ILLNESS:  [name] is a 50 year old man with  multiple medical problems but __________with significant right foot  pain, accompanied by an overall fatigue, and a 20-pound weight loss, due  to decreased appetite.  After evaluation at the [facility] outpatient  clinic, he was admitted for further evaluation of vascular  occlusion,  since  examination demonstrated a dry gangrene eschar over the dorsum of  his foot.  Labs also were concerning of leukocytosis, as well as  thrombocytosis.  Upon admission, DVT prophylaxis was given.  Upon  admission, he was found to have a clot in the SFA - popliteal artery,  having to undergo below the knee amputation.  Hypercoagulable panel was  positive for lupus anticoagulant, but normal anticardiolipin and beta-2  glycoprotein antibodies.  It is important to mention that he also was  found to have per chest x-ray a lung nodule of unknown significance at  this time.  We were asked to see him in consultation secondary to the  abnormal labs.   PAST MEDICAL HISTORY:  HIV, well controlled.   CURRENT MEDICATIONS:  1. Zinacef 1.5 grams IV q.12 h.  2. Colace 100 mg b.i.d.  3. Ensure t.i.d.  4. Neurontin 300 mg p.o. t.i.d.,  5  Heparin per pharmacy.  1. Kaletra  2 tablets b.i.d.  2. Megestrol 100 mg daily.  3. Robaxin 500 mg q.6 h.  P.r.n.  4. Morphine sulfate 45 mg p.o. b.i.d.  and  2-5 mg IV q. 1 hour p.r.n.  5. Bactroban nasal one application NS b.i.d.  6. Oxycodone 5-10 mg p.o. q.4 h.  p.r.n.  7. Zerit 40 mg p.o.  b.i.d.  8. Tenofovir 300 mg p.o. daily.  9. Testosterone  400 mg q. Tuesday IM.  10.Vancomycin 1 gram IV q.12 h. as per pharmacy, or as scheduled.   ALLERGIES:  SEPTRA, DAPSONE.   SOCIAL HISTORY:  The patient is married.  He has a 38 pack-year history  of smoking.  Denies alcohol or drug use.   FAMILY HISTORY:  Mother with hypertension, otherwise healthy.  Father  alive and well.   REVIEW OF SYSTEMS:  Please see HPI for significant positives.  The rest  of the review of systems is negative.   PHYSICAL EXAMINATION:  VITAL SIGNS:  Stable, afebrile, saturation of  oxygen within normal limits.  HEENT:  Normocephalic, atraumatic.  PERLA.  No leukoplakia.  Oropharynx  clear.  Temporal wasting.  CARDIOVASCULAR:  Regular rate and rhythm without murmurs, rubs or  gallops.  LUNGS:  Clear to auscultation bilaterally.  ABDOMEN:  Soft, nontender.  Bowel sounds x4.  EXTREMITIES:  No edema.  A 2 cm right femoral lymph node.   LABORATORY DATA:  Sodium 131, potassium 3.8, BUN 4,  creatinine 0.47,  calcium 8.4, total bilirubin 0.8, alkaline phosphatase 88, AST 86, ALT  90, total protein 9.7, albumin 2.5, calcium 9.1.  Hypercoagulable panel  shows antithrombin [name] 133, protein C functional  104, protein S  functional 53, homocysteine 6.6, lupus anticoagulant detected,  beta-2  lipoprotein less than 20, cardiolipin antibody less than 10.  Negative  for factor V mutation. Negative for prothrombin gene mutation.  TSH  1441.  Hepatitis panel negative.  Total lymph count 3120.  CD-4 at 630,  CD-8 at 1240.  C-reactive protein 25, LDH 218.   RADIOLOGY:  CT of the chest shows no evidence of thoracic metastasis or  primary malignancy, but multiple ground glass nodules within the right  and left lungs.  CT of the abdomen shows no focal hepatic lesions.  No  evidence of abdominal malignancy.  CT of the pelvis with no evidence of  pelvic malignancy.  No chills.  No CT angio of  bilateral lower  extremities on August 2006 shows long segment chronic occlusion of the  mid to distal SFA with short segment constitution of the popliteal  artery above the knee followed by chronic occlusion of the popliteal  artery at the knee joint, occlusion of the left popliteal artery just  above the knee joint with collateral reconstitution.   ASSESSMENT/PLAN:  Dr. Donnie Coffin has seen and evaluated the patient and  reviewed the chart.  This is a 50 year old man who presents with right  foot pain to the family practice center, ultimately found to have a clot  in the SFA and popliteal artery.  Hypercoagulable panel shows positive  LAC but normal anticardiolipin and beta-2 glycoprotein antibodies.  His  platelet abnormality was likely secondary to acute clot process.  Homocysteine is normal.  Other workup included CT of the chest, with  vague ground glass opacities, likely infectious, less likely tumor  although  bronchoalveolar cancer remains a possibility.  In view of  arterial process, would recommend aspirin and Plavix and discontinue  heparin.  Would also check a 2-D echo to rule out PFO another possible  source of arterial embolus.  Of note, is right femoral area has a 2-cm  lymph node.  Would discontinue heparin and take time to biopsy the right  lymph node.  Consider bronchoscopy with biopsy to determine the etiology  of the lung process.  Will follow.   Thank you very much for allowing [place] the opportunity to participate in  the care of this patient.      [name], P.A.      Pierce Crane, MD  Electronically Signed   SW/MEDQ  D:  06/30/2009  T:  06/30/2009  Job:  [id number]   cc:   Cliffton Asters, M.D.

## 2011-03-14 NOTE — Op Note (Signed)
[name], KAUS NO.:  [id number]   MEDICAL RECORD NO.:  [id number]          PATIENT TYPE:  INP   LOCATION:  5022                         FACILITY:  [facility]   PHYSICIAN:  Larina Earthly, M.D.    DATE OF BIRTH:  September 07, 1961   DATE OF PROCEDURE:  06/28/2009  DATE OF DISCHARGE:                               OPERATIVE REPORT   PREOPERATIVE DIAGNOSIS:  Gangrene, right foot.   POSTOPERATIVE DIAGNOSIS:  Gangrene, right foot.   PROCEDURE:  Right above-knee amputation.   SURGEON:  Larina Earthly, MD   ASSISTANT:  Della Goo, PA-C   ANESTHESIA:  General endotracheal.   COMPLICATIONS:  None.   DISPOSITION:  To recovery room, stable.   PROCEDURE IN DETAIL:  The patient was taken to the operating room,  placed in supine position where the right leg was prepped and draped in  usual sterile fashion.  Fishmouth-type incision was made just above the  knee, carried down through the subcutaneous tissue and fascia.  Tributary branches were ligated with 0 Vicryl ties and divided.  The  incision was continued deep down to the level of the femur.  The  periosteum was scored with electrocautery.  The superficial femoral  artery and superficial femoral vein were individually ligated and  superficial femoral artery was chronically occluded.  The sciatic nerve  was elevated proximally and was occluded with a hemostat and divided and  ligated with a 0 Vicryl tie.  The periosteum was elevated proximally  over several centimeters and then femur was divided with a Gigli saw.  The edges of the femur were smoothed with a bone rasp.  Wounds were  irrigated with saline.  Hemostasis with electrocautery.  The anterior  fascia was then closed to the posterior fascia with interrupted 0 Vicryl  figure-of-eight sutures.  Skin counts were applied to the skin after  irrigating the wound.  Sterile dressing was applied and the patient was  taken to the recovery room in stable  condition.      Larina Earthly, M.D.  Electronically Signed     TFE/MEDQ  D:  06/28/2009  T:  06/29/2009  Job:  [id number]

## 2011-03-14 NOTE — Assessment & Plan Note (Signed)
OFFICE VISIT   SEARCY, MIYOSHI  DOB:  Jun 04, 1961                                       08/06/2009  EAVWU#:[id number]   The patient presents today for follow-up of his right knee amputation  and also for follow-up of his left second toe ulceration.  I had seen  him at [facility] in August where he had extensive gangrene of  his right foot.  He did have a severe lower extremity arterial  insufficiency, but was felt to have such extensive gangrene that limb  salvage was not possible and he underwent an uneventful right above-knee  amputation.  He is here today for staple removal.  He does have an  ulceration over his left toe, currently this is stable with no evidence  of infection.  No evidence of erythema.  He reports that he is having  minimal discomfort related to this.  I reviewed his CT angiogram from  09/21 and this showed a known occlusion of his popliteal artery on the  left with a severely diseased distal tibial vessel reconstruction.  I  discussed options with the patient and his wife.  I feel that he very  well has a good chance of healing this without reconstruction.  I did  explain the possible tibial bypass for improvement of flow and limb  salvage on the left.  I explained that we are nowhere near the same  situation we were on the right side where he had such extensive tissue  loss that he was non salvageable at presentation.  He sees Dr. Cliffton Asters every 2 weeks for follow-up of his foot to assure healing.  He  will see [place] again on an as-needed basis.  Should he have progressive  changes of tissue loss or pain, we would recommend formal arteriogram  and bypass if possible.   Larina Earthly, M.D.  Electronically Signed   TFE/MEDQ  D:  08/06/2009  T:  08/09/2009  Job:  [id number]   cc:   Cliffton Asters, M.D.

## 2011-03-14 NOTE — Consult Note (Signed)
[name], [name]                ACCOUNT NO.:  [id number]   MEDICAL RECORD NO.:  [id number]          PATIENT TYPE:  INP   LOCATION:  5022                         FACILITY:  [facility]   PHYSICIAN:  Larina Earthly, M.D.    DATE OF BIRTH:  [birth year]   DATE OF CONSULTATION:  06/24/2009  DATE OF DISCHARGE:                                 CONSULTATION   REASON FOR CONSULTATION:  Right foot ischemia.   HISTORY OF PRESENT ILLNESS:  [name] [name] is a very complex 50 year old  gentleman with a history of HIV who has 72-month history of foot pain.  He has had multiple diagnosis to include gout, tarsal compression  syndrome, and was admitted for pain control and further workup.  He  reports this as a chronic continuous pain related to this.  He has great  deal of 40-pound weight loss and is quite cachectic and has had  progressive clinical deterioration.   ALLERGIES:  1. SEPTRA.  2. DAPSONE.   PAST MEDICAL HISTORY:  HIV.   SOCIAL HISTORY:  He has 30-pack a year history of smoking and lives with  his wife.  He denies alcohol use.   MEDICATIONS:  Listed in his chart.   REVIEW OF SYSTEMS:  Listed in his chart.   PHYSICAL EXAMINATION:  Alert black male, in no acute distress except for  pain in his right foot.  He is quite cachectic.  He does have 2+ radial  and 2+ femoral pulses.  His left leg is noted for a 2 to 3+ left  posterior tibial pulse.  He has absent right popliteal and absent right  pedal pulses.  He has extensive dry gangrene over the entire dorsum of  his foot.  His toes appear to be spared, although they are cool.   I discussed options with [name] [name] in his family's presence.  I  explained that, I am quite concerned that with the extent of necrosis  that he currently has that he may even with revascularization may not  have a salvageable foot.  He does have full-thickness loss over the  entire dorsum of his foot and feel that this may not be a salvageable  situation even if  we proceeded with reconstruction.  I will discuss this  with the medicine service.  Certainly, if we want to be extremely  aggressive, we will get plastic surgery involved to determine if  improved inflow would allow healing.  He did have a CT angiogram, which  showed a long segments superficial femoral artery occlusion on the right  eye with reconstitution short segment popliteal and then below-knee  occlusion of his popliteal as well.  I discussed this at length with [name] and his family who understand.     Larina Earthly, M.D.  Electronically Signed    TFE/MEDQ  D:  06/24/2009  T:  06/25/2009  Job:  [id number]

## 2011-03-14 NOTE — Consult Note (Signed)
[name]                ACCOUNT NO.:  [id number]   MEDICAL RECORD NO.:  [id number]          PATIENT TYPE:  INP   LOCATION:  5022                         FACILITY:  [facility]   PHYSICIAN:  Casimiro Needle L. Reynolds, M.D.DATE OF BIRTH:  1960-12-18   DATE OF CONSULTATION:  06/24/2009  DATE OF DISCHARGE:                                 CONSULTATION   CHIEF COMPLAINT:  Right leg atrophy.   HISTORY OF PRESENT ILLNESS:  This is a 50 year old male who over the  past 3 months has acquired an exquisitely painful right foot.  The  patient was initially diagnosed with gout, prescribed colchicine, which  was of some benefit.  Due to ongoing pain, the patient was then put on a  prednisone Dosepak and MRI was obtained of the right ankle.  MRI showed  to be negative with the exception of possibility of tarsal tunnel  syndrome.  Over a period of approximately 2 to 3 months, the patient's  right ankle continued to cause severe pain without any relief.  The  patient, during this time, also obtained an MRI of his L spine which  showed no significant neuronal compression.  According to his wife, the  patient was very active, up until approximately 1 week ago.  At that  point, the patient had such significant pain he stopped using his right  leg and became wheelchair bound secondary to pain.  Over the past 1 to 2  weeks, she also noted that he had lost his appetite, refusing to eat.  The patient states that he did not eat secondary to his significant pain  in his right leg.  Both the patient and wife state he has lost between  20 and 30 pounds over the last 2 to 3 months.  Due to significant muscle  wasting and eschar noted on the dorsum of the right foot, the patient  was brought to the hospital.  On June 23, 2009, a CT angiograph of the  bilateral lower extremities showed chronic occlusion to the mid to  distal SFA with short-segment reconstruction of popliteal artery above  the knee followed by  chronic occlusion of the popliteal artery at the  knee joint.  While in the hospital, the patient also had a vascular  surgery consult in which there is a question of salvaging the right foot  secondary to significant ischemia.  Discussion is still being made on  this point.   The patient states at no time did he feel globally weak, note any  fasciculation, any proximal weakness, any inability to use his upper or  lower extremities, difficulty eating, difficulty swallowing, stomach  pain, nausea or vomiting, diplopia, incontinence, tremor.   PAST MEDICAL HISTORY:  Significant for HIV, which has been stable with a  T cell count of 900.   MEDICATIONS:  1. [name] 400 mg capsules.  2. Kaletra 200/50 mg tabs.  3. Neurontin 300 mg caps t.i.d.  4. MS-Contin 30 mg XR 12 H tab 1 tab by mouth 2 times a day.  5. Oxycodone 5 mg 1 to 2 tabs by mouth every  6 hours as needed.   SOCIAL HISTORY:  The patient lives with his wife.  He has a 38-pack-year  history of smoking.  Denies any alcohol or illicit drug use.   FAMILY HISTORY:  Mother is otherwise healthy.  Father is otherwise  healthy.   REVIEW OF SYSTEMS:  As above.   PHYSICAL EXAM:  VITAL SIGNS:  Blood pressure is 118/.68, pulse 107,  respiratory rate 20, temperature 98.6.  CONSTITUTIONAL:  The patient is alert.  He is oriented x3.  He is able  to give me a good history.  He is well enunciated.  He does have a  stutter.  However, this is not new.  CRANIAL NERVES:  Pupils are equal, round, and reactive to light and  accommodation.  Conjugate gaze.  Extraocular muscles intact.  Visual  fields grossly intact.  Face is symmetrical.  Tongue is midline.  Uvula  is midline.  He has no dysarthria, aphasia, or slurred speech.  Facial  sensation V1 through V3 is full.  Shoulder shrug and head-turn is within  normal limits.  COORDINATION:  Finger to nose is smooth.  Heel to shin smooth.  Fine  motor movements within normal limits.  GAIT:  Not  tested at this time.  MOTOR:  The patient moves bilateral upper extremities and left lower  extremity with full range of motion and 5/5 strength.  The patient moves  right lower extremity with full flexion and extension less than 15  degrees.  The patient shows significant atrophy of his right calf and  greater atrophy in his right quadriceps than his left quadriceps.  Otherwise, he is also globally cachectic.  The patient's right calf  measured 2 cm in diameter less than left calf, and the patient's right  quadriceps measured 4 cm less than his left quadriceps.  The patient  shows no fasciculations.  He does have a tremor, but wife states that  this is secondary to his morphine that he is taking.  DEEP TENDON REFLEXES:  Brisk at 2+ throughout with downgoing left toe.  Right toe is not checked as the patient is significantly tender in his  right foot.  DRIFT:  Negative drift upper and lower extremities.  PULMONARY:  Clear to auscultation bilaterally.  CARDIOVASCULAR:  S1, S2 regular rate and rhythm.  NECK:  Negative for bruits and supple.  SENSATION:  Full to pinprick, light touch, and vibration throughout,  except over dorsum of the right foot and ankle, which has decreased  sensation.   LABORATORY STUDIES:  CRP is 25, ESR130.  Hepatitis B and C were  negative.  HIV was reactive.  PT 15.4, INR 1.2, PTT 37, CK 624.  Sodium  126, potassium 3.5, chloride 90, CO2 27, BUN 4, creatinine 0.63, glucose  166.  White blood cell count 21.6, platelets 577,000, hemoglobin and  hematocrit 106 and 31.3.  Urine drug screen showed positive for opiates,  which the patient is on opiates at this time.   IMAGING AND TESTS:  As above.   ASSESSMENT:  Right lower extremity pain and atrophy in the setting of  limb-threatening ischemia with resultant disuse, suspect combination of  both ischemia and disuse as the cause which led to much of the atrophy  to the point in which the ankle is immobile at this  time.  The patient  shows good strength throughout.  Reflexes are grossly intact, making  radiculitis/plexitis unlikely.  Otherwise, there are no generalized  signs of neuronal disease, myopathy, et  Karie Soda.  The patient shows minor  L spine abnormalities, but inadequate to explain this atrophy.   RECOMMENDATIONS:  Could do an EMG nerve conduction study to more firmly  exclude plexitis, but the patient's limb is not salvageable or at least  sounds like it is not at this time.     ______________________________  Felicie Morn, PA-C      Marolyn Hammock. Thad Ranger, M.D.  Electronically Signed    DS/MEDQ  D:  06/24/2009  T:  06/24/2009  Job:  [id number]   cc:   Casimiro Needle L. Thad Ranger, M.D.  Acey Lav, MD

## 2011-03-14 NOTE — Consult Note (Signed)
NAMELIEF, PALMATIER NO.:  [id number]   MEDICAL RECORD NO.:  [id number]          PATIENT TYPE:  INP   LOCATION:  5022                         FACILITY:  [facility]   PHYSICIAN:  Gaynelle Adu, MD        DATE OF BIRTH:  October 25, 1961   DATE OF CONSULTATION:  DATE OF DISCHARGE:                                 CONSULTATION   REQUESTING PHYSICIAN:  Acey Lav, MD   CONSULTING SURGEON:  Gaynelle Adu, MD   HISTORY OF PRESENT ILLNESS:  Mr. Delay is a 50 year old African  American male who we have been asked to consult on for right groin  lymphadenopathy/mass.  He was admitted with ischemic changes of the  right foot consistent with dry gangrene and acute arterial occlusion and  apparently the patient is now status post right above-knee amputation,  he is 2 days out from that procedure.  When we originally assessed the  patient, he had voided all over himself in his clothing and his chair  and was very uncooperative with providing history and actually declined  by examination.  He made mention of possibly leaving AMA, but I  encouraged him to stay to continue the medical care that he was  receiving.  Apparently somewhere along the course of his admission, a  palpable lymph node or mass was found in the right groin.  The patient  did eventually allow me to obtain a history and declines ever knowing  that there was a palpable node or nodes in his groin.  Denies any pain  in that region.  He states that he had some recent weight loss but  admits to poor financial situation and has been difficult to provide  food for he and his wife.  He is HIV positive male and is on  medications.  He, otherwise, denied any fever, chills, or myalgias  associated with this prior to admission aside from this extreme right  leg pain due to ischemia.  The patient has had persistent leukocytosis  which trended all the way up to 22.9 as of June 28, 2009, but that was  the date the patient had  a right above-knee amputation.  Subsequently  since then, it has trended down to 19.6 as of today.  His pain is,  otherwise, controlled for the right above-knee amputation neuropathy,  but he denies pain in the groin.   PAST MEDICAL HISTORY:  HIV positive.   SURGICAL HISTORY:  Right above-knee amputation.   SOCIAL HISTORY:  The patient is married, lives with his wife.  Has  positive tobacco.  No alcohol use.   ALLERGIES:  SEPTRA and DAPSONE.   MEDICATIONS:  Refer to medication reconciliation.   REVIEW OF SYSTEMS:  See HPI.   PHYSICAL EXAMINATION:  VITAL SIGNS:  Temperature 99, heart rate 110,  blood pressure 112/70, respiratory rate 118.  GENERAL APPEARANCE:  This is a 50 year old African American male,  cachectic appearing, sitting in a chair.  SKIN AND LYMPHATICS:  Limited exam of the skin and lymphatics system  revealing status post right above-knee amputation.  In the  right groin  area, there is a nontender, mobile, palpable lymph node approximately 2  x 3 cm.  There is no overlying erythema or fluctuance to suggest active  infection or abscess.  It is most consistent with lymphadenopathy.  Examination of left groin, bilateral axillae, supraclavicular, and  cervical chains revealed no additional lymphadenopathy.  The patient's  skin is warm and dry without rashes or lesions.  EXTREMITIES:  The right lower extremity stump wound has a surgical  dressing on it and is intact.  There is no erythema of the thigh to  suggest ascending cellulitis or lymphangitis.   LABORATORY DATA AND DIAGNOSTICS:  Recent CBC reveals white blood cell  count down to 19.6, hemoglobin of 9.0, hematocrit of 26.5, platelet  count 650.  CT angiogram and right lower extremity MRI made no mention  of any significant lymphadenopathy on scan; however, review of the CTA  does show what appears to be an enlarged lymph node in the right groin.   ASSESSMENT:  Right inguinal lymphadenopathy.  We feel this is  likely  reactive to his recent ischemic and gangrenous lower extremity that is  now status post above-knee amputation.  His leukocytosis is trending  down.  We do not feel this is suspicious for malignant process at  present time.  We would highly recommend conservative management and  observation, as the patient is at high risk for lymphatic leak and poor  wound healing given his immunocompromised state.  At this point for the  patient, we would not recommend anything else.  We would monitor this  patient.  If the lymph node persisted or became tender over the next 4-6  weeks, we would be happy to reevaluate the patient for possible  excisional biopsy at that time.  I do not find any other acute surgical  indications for this patient.  Please contact us if you have any further  request.      Brayton El, PA-C      Gaynelle Adu, MD  Electronically Signed    KB/MEDQ  D:  06/30/2009  T:  07/01/2009  Job:  [id number]

## 2011-03-17 ENCOUNTER — Encounter: Payer: Self-pay | Admitting: Internal Medicine

## 2011-03-20 ENCOUNTER — Ambulatory Visit (INDEPENDENT_AMBULATORY_CARE_PROVIDER_SITE_OTHER): Payer: Medicare Other | Admitting: Pharmacist

## 2011-03-20 DIAGNOSIS — I749 Embolism and thrombosis of unspecified artery: Secondary | ICD-10-CM

## 2011-03-20 DIAGNOSIS — Z7901 Long term (current) use of anticoagulants: Secondary | ICD-10-CM

## 2011-03-20 NOTE — Patient Instructions (Signed)
Patient instructed to take medications as defined in the Anti-coagulation Track section of this encounter.  Patient instructed to take today's dose.  Patient verbalized understanding of these instructions.    

## 2011-03-20 NOTE — Progress Notes (Signed)
Anti-Coagulation Progress Note  [name] is a 50 y.o. male who is currently on an anti-coagulation regimen.    RECENT RESULTS: Recent results are below, the most recent result is correlated with a dose of 57.5 mg. per week: Lab Results  Component Value Date   INR 2.10 03/20/2011   INR 4.0 02/20/2011   INR 2.5 01/23/2011   PROTIME 39.6* 03/24/2010    ANTI-COAG DOSE:   Latest dosing instructions   Total Sun Mon Tue Wed Thu Fri Sat   60 7.5 mg 10 mg 7.5 mg 10 mg 7.5 mg 10 mg 7.5 mg    (5 mg1.5) (5 mg2) (5 mg1.5) (5 mg2) (5 mg1.5) (5 mg2) (5 mg1.5)         ANTICOAG SUMMARY: Anticoagulation Episode Summary              Current INR goal 2.0-3.0 Next INR check 04/17/2011   INR from last check 2.10 (03/20/2011)     Weekly max dose (mg)  Target end date Indefinite   Indications EMBOLISM AND THROMBOSIS, ARTERY, Long term current use of anticoagulant   [facility]    Send INR reminders to [facility]   Comments        Provider Role Specialty Phone number   Cliffton Asters, MD  Infectious Diseases [phone]        ANTICOAG TODAY: Anticoagulation Summary as of 03/20/2011              INR goal 2.0-3.0     Selected INR 2.10 (03/20/2011) Next INR check 04/17/2011   Weekly max dose (mg)  Target end date Indefinite   Indications EMBOLISM AND THROMBOSIS, ARTERY, Long term current use of anticoagulant    Anticoagulation Episode Summary              INR check location [facility]    Send INR reminders to [facility]   Comments        Provider Role Specialty Phone number   Cliffton Asters, MD  Infectious Diseases [phone]        PATIENT INSTRUCTIONS: Patient Instructions  Patient instructed to take medications as defined in the Anti-coagulation Track section of this encounter.  Patient instructed to taketoday's dose.  Patient verbalized understanding of these instructions.         FOLLOW-UP Return in 4  weeks (on 04/17/2011) for Follow up INR.  [name], [name] Pharm.D., CACP

## 2011-04-03 ENCOUNTER — Other Ambulatory Visit: Payer: Self-pay | Admitting: Internal Medicine

## 2011-04-11 ENCOUNTER — Other Ambulatory Visit: Payer: Self-pay | Admitting: *Deleted

## 2011-04-11 DIAGNOSIS — G547 Phantom limb syndrome without pain: Secondary | ICD-10-CM

## 2011-04-11 DIAGNOSIS — G8929 Other chronic pain: Secondary | ICD-10-CM

## 2011-04-11 MED ORDER — OXYCODONE HCL 5 MG PO TABS: 5.0000 mg | ORAL_TABLET | Freq: Two times a day (BID) | ORAL | Status: DC

## 2011-04-11 MED ORDER — MORPHINE SULFATE CR 30 MG PO TB12: 30.0000 mg | ORAL_TABLET | Freq: Two times a day (BID) | ORAL | Status: DC

## 2011-04-11 MED ORDER — MORPHINE SULFATE CR 60 MG PO TB12: 60.0000 mg | ORAL_TABLET | Freq: Two times a day (BID) | ORAL | Status: DC

## 2011-04-12 ENCOUNTER — Encounter: Payer: Self-pay | Admitting: Internal Medicine

## 2011-04-12 ENCOUNTER — Telehealth: Payer: Self-pay | Admitting: *Deleted

## 2011-04-12 NOTE — Telephone Encounter (Signed)
States his wife died last Tuesday & he needs a letter from his md stating that he is capable of handling his own affairs. This is so he can get his check from [employer] in his name & his daughter's check increased based on his wife's death. Cited financial pressures & may have to move.  His wife was Armarion Greek 12-18-53. He had brought several paid bills to show that he will be able to pay bills. He asked that this be done asap & that we call him when it is ready [phone]. To md

## 2011-04-12 NOTE — Telephone Encounter (Signed)
I dictated a letter today and left it in my in box for [name] to pick up tomorrow.

## 2011-04-13 ENCOUNTER — Encounter: Payer: Self-pay | Admitting: *Deleted

## 2011-04-17 ENCOUNTER — Ambulatory Visit (INDEPENDENT_AMBULATORY_CARE_PROVIDER_SITE_OTHER): Payer: Medicare Other | Admitting: Pharmacist

## 2011-04-17 DIAGNOSIS — Z7901 Long term (current) use of anticoagulants: Secondary | ICD-10-CM

## 2011-04-17 DIAGNOSIS — I749 Embolism and thrombosis of unspecified artery: Secondary | ICD-10-CM

## 2011-04-17 LAB — POCT INR: INR: 2.3

## 2011-04-17 NOTE — Patient Instructions (Signed)
Patient instructed to take medications as defined in the Anti-coagulation Track section of this encounter.  Patient instructed to take today's dose.  Patient verbalized understanding of these instructions.    

## 2011-04-17 NOTE — Progress Notes (Signed)
Anti-Coagulation Progress Note  [name] is a 50 y.o. male who is currently on an anti-coagulation regimen.    RECENT RESULTS: Recent results are below, the most recent result is correlated with a dose of 60 mg. per week: Lab Results  Component Value Date   INR 2.3 04/17/2011   INR 2.10 03/20/2011   INR 4.0 02/20/2011   PROTIME 39.6* 03/24/2010    ANTI-COAG DOSE:   Latest dosing instructions   Total Sun Mon Tue Wed Thu Fri Sat   60 7.5 mg 10 mg 7.5 mg 10 mg 7.5 mg 10 mg 7.5 mg    (5 mg1.5) (5 mg2) (5 mg1.5) (5 mg2) (5 mg1.5) (5 mg2) (5 mg1.5)         ANTICOAG SUMMARY: Anticoagulation Episode Summary              Current INR goal 2.0-3.0 Next INR check 05/15/2011   INR from last check 2.3 (04/17/2011)     Weekly max dose (mg)  Target end date Indefinite   Indications EMBOLISM AND THROMBOSIS, ARTERY, Long term current use of anticoagulant   [facility]    Send INR reminders to [facility]   Comments        Provider Role Specialty Phone number   Cliffton Asters, MD  Infectious Diseases [phone]        ANTICOAG TODAY: Anticoagulation Summary as of 04/17/2011              INR goal 2.0-3.0     Selected INR 2.3 (04/17/2011) Next INR check 05/15/2011   Weekly max dose (mg)  Target end date Indefinite   Indications EMBOLISM AND THROMBOSIS, ARTERY, Long term current use of anticoagulant    Anticoagulation Episode Summary              INR check location [facility] Pref[facility]    Send INR reminders to [facility]   Comments        Provider Role Specialty Phone number   Cliffton Asters, MD  Infectious Diseases [phone]        PATIENT INSTRUCTIONS: Patient Instructions  Patient instructed to take medications as defined in the Anti-coagulation Track section of this encounter.  Patient instructed to take today's dose.  Patient verbalized understanding of these instructions.        FOLLOW-UP Return in 4 weeks (on  05/15/2011) for Follow up INR.  [name], [name] Pharm.D., CACP

## 2011-04-21 ENCOUNTER — Other Ambulatory Visit: Payer: Self-pay | Admitting: Internal Medicine

## 2011-04-24 ENCOUNTER — Other Ambulatory Visit: Payer: Self-pay | Admitting: Pharmacist

## 2011-04-24 MED ORDER — WARFARIN SODIUM 5 MG PO TABS: ORAL_TABLET | ORAL | Status: DC

## 2011-04-27 ENCOUNTER — Other Ambulatory Visit: Payer: Self-pay | Admitting: Licensed Clinical Social Worker

## 2011-04-27 NOTE — Telephone Encounter (Signed)
Kaletra was called in to the [facility] in [place] by mistake. Not sure why it was called in but in the old EMR it shows he is to only be on norvir,zerit,viread,reyataz.

## 2011-05-02 ENCOUNTER — Other Ambulatory Visit: Payer: Self-pay | Admitting: Internal Medicine

## 2011-05-02 DIAGNOSIS — G629 Polyneuropathy, unspecified: Secondary | ICD-10-CM

## 2011-05-11 ENCOUNTER — Other Ambulatory Visit: Payer: Self-pay | Admitting: *Deleted

## 2011-05-11 DIAGNOSIS — G8929 Other chronic pain: Secondary | ICD-10-CM

## 2011-05-11 DIAGNOSIS — G547 Phantom limb syndrome without pain: Secondary | ICD-10-CM

## 2011-05-11 MED ORDER — MORPHINE SULFATE CR 30 MG PO TB12: 30.0000 mg | ORAL_TABLET | Freq: Two times a day (BID) | ORAL | Status: DC

## 2011-05-11 MED ORDER — MORPHINE SULFATE CR 60 MG PO TB12: 60.0000 mg | ORAL_TABLET | Freq: Two times a day (BID) | ORAL | Status: DC

## 2011-05-11 MED ORDER — OXYCODONE HCL 5 MG PO TABS: 5.0000 mg | ORAL_TABLET | Freq: Two times a day (BID) | ORAL | Status: DC

## 2011-05-15 ENCOUNTER — Ambulatory Visit (INDEPENDENT_AMBULATORY_CARE_PROVIDER_SITE_OTHER): Payer: Medicare Other | Admitting: Pharmacist

## 2011-05-15 DIAGNOSIS — Z7901 Long term (current) use of anticoagulants: Secondary | ICD-10-CM

## 2011-05-15 DIAGNOSIS — I749 Embolism and thrombosis of unspecified artery: Secondary | ICD-10-CM

## 2011-05-15 LAB — POCT INR: INR: 3.8

## 2011-05-15 NOTE — Patient Instructions (Signed)
Patient instructed to take medications as defined in the Anti-coagulation Track section of this encounter.  Patient instructed to take today's dose.  Patient verbalized understanding of these instructions.    

## 2011-05-15 NOTE — Progress Notes (Signed)
Anti-Coagulation Progress Note  Jencarlos Nicolson is a 50 y.o. male who is currently on an anti-coagulation regimen.    RECENT RESULTS: Recent results are below, the most recent result is correlated with a dose of 60 mg. per week: Lab Results  Component Value Date   INR 3.80 05/15/2011   INR 2.3 04/17/2011   INR 2.10 03/20/2011   PROTIME 39.6* 03/24/2010    ANTI-COAG DOSE:   Latest dosing instructions   Total Sun Mon Tue Wed Thu Fri Sat   55 7.5 mg 7.5 mg 7.5 mg 10 mg 7.5 mg 7.5 mg 7.5 mg    (5 mg1.5) (5 mg1.5) (5 mg1.5) (5 mg2) (5 mg1.5) (5 mg1.5) (5 mg1.5)         ANTICOAG SUMMARY: Anticoagulation Episode Summary              Current INR goal 2.0-3.0 Next INR check 05/15/2011   INR from last check 3.80! (05/15/2011)     Weekly max dose (mg)  Target end date Indefinite   Indications EMBOLISM AND THROMBOSIS, ARTERY, Long term current use of anticoagulant   [facility] check location [facility]    Send INR reminders to [facility]   Comments        Provider Role Specialty Phone number   Cliffton Asters, MD  Infectious Diseases [phone]        ANTICOAG TODAY: Anticoagulation Summary as of 05/15/2011              INR goal 2.0-3.0     Selected INR 3.80! (05/15/2011) Next INR check    Weekly max dose (mg)  Target end date Indefinite   Indications EMBOLISM AND THROMBOSIS, ARTERY, Long term current use of anticoagulant    Anticoagulation Episode Summary              INR check location [facility]    Send INR reminders to [facility] IMP   Comments        Provider Role Specialty Phone number   Cliffton Asters, MD  Infectious Diseases [phone]        PATIENT INSTRUCTIONS: Patient Instructions  Patient instructed to take medications as defined in the Anti-coagulation Track section of this encounter.  Patient instructed to take today's dose.  Patient verbalized understanding of these instructions.        FOLLOW-UP Return in 4 weeks  (on 06/12/2011) for Follow up INR.  [name], [name] Pharm.D., CACP

## 2011-06-09 ENCOUNTER — Other Ambulatory Visit: Payer: Self-pay | Admitting: *Deleted

## 2011-06-09 DIAGNOSIS — G547 Phantom limb syndrome without pain: Secondary | ICD-10-CM

## 2011-06-09 DIAGNOSIS — G8929 Other chronic pain: Secondary | ICD-10-CM

## 2011-06-09 MED ORDER — MORPHINE SULFATE CR 30 MG PO TB12: 30.0000 mg | ORAL_TABLET | Freq: Two times a day (BID) | ORAL | Status: DC

## 2011-06-09 MED ORDER — OXYCODONE HCL 5 MG PO TABS: 5.0000 mg | ORAL_TABLET | Freq: Two times a day (BID) | ORAL | Status: DC

## 2011-06-09 MED ORDER — MORPHINE SULFATE CR 60 MG PO TB12: 60.0000 mg | ORAL_TABLET | Freq: Two times a day (BID) | ORAL | Status: DC

## 2011-06-09 NOTE — Telephone Encounter (Signed)
Pt requested pain rx refill.  Jennet Maduro, RN

## 2011-06-12 ENCOUNTER — Ambulatory Visit (INDEPENDENT_AMBULATORY_CARE_PROVIDER_SITE_OTHER): Payer: Medicare Other | Admitting: Pharmacist

## 2011-06-12 DIAGNOSIS — Z7901 Long term (current) use of anticoagulants: Secondary | ICD-10-CM

## 2011-06-12 DIAGNOSIS — I749 Embolism and thrombosis of unspecified artery: Secondary | ICD-10-CM

## 2011-06-12 LAB — POCT INR: INR: 4.1

## 2011-06-12 MED ORDER — WARFARIN SODIUM 5 MG PO TABS: ORAL_TABLET | ORAL | Status: DC

## 2011-06-12 NOTE — Patient Instructions (Signed)
Patient instructed to take medications as defined in the Anti-coagulation Track section of this encounter.  Patient instructed to OMIT/HOLD today's dose.  Patient verbalized understanding of these instructions.    

## 2011-06-12 NOTE — Progress Notes (Signed)
Apparently Mr. Kunesh indication for chronic anti-coagulation is an arterial thrombus in the past.  Agree with the plan.

## 2011-06-12 NOTE — Progress Notes (Signed)
Anti-Coagulation Progress Note  [name] is a 50 y.o. male who is currently on an anti-coagulation regimen.    RECENT RESULTS: Recent results are below, the most recent result is correlated with a dose of 55 mg. per week: Lab Results  Component Value Date   INR 4.10 06/12/2011   INR 3.80 05/15/2011   INR 2.3 04/17/2011   PROTIME 39.6* 03/24/2010    ANTI-COAG DOSE:   Latest dosing instructions   Total Sun Mon Tue Wed Thu Fri Sat   47.5 7.5 mg 5 mg 7.5 mg 7.5 mg 5 mg 7.5 mg 7.5 mg    (5 mg1.5) (5 mg1) (5 mg1.5) (5 mg1.5) (5 mg1) (5 mg1.5) (5 mg1.5)         ANTICOAG SUMMARY: Anticoagulation Episode Summary              Current INR goal 2.0-3.0 Next INR check 06/26/2011   INR from last check 4.10! (06/12/2011)     Weekly max dose (mg)  Target end date Indefinite   Indications EMBOLISM AND THROMBOSIS, ARTERY, Long term current use of anticoagulant   INR check location [facility]    Send INR reminders to [facility] IMP   Comments        Provider Role Specialty Phone number   Cliffton Asters, MD  Infectious Diseases [phone]        ANTICOAG TODAY: Anticoagulation Summary as of 06/12/2011              INR goal 2.0-3.0     Selected INR 4.10! (06/12/2011) Next INR check 06/26/2011   Weekly max dose (mg)  Target end date Indefinite   Indications EMBOLISM AND THROMBOSIS, ARTERY, Long term current use of anticoagulant    Anticoagulation Episode Summary              INR check location [facility]    Send INR reminders to [facility] IMP   Comments        Provider Role Specialty Phone number   Cliffton Asters, MD  Infectious Diseases [phone]        PATIENT INSTRUCTIONS: Patient Instructions  Patient instructed to take medications as defined in the Anti-coagulation Track section of this encounter.  Patient instructed to OMIT/HOLD today's dose.  Patient verbalized understanding of these instructions.        FOLLOW-UP Return  in 2 weeks (on 06/26/2011) for Follow up INR.  [name], [name] Pharm.D., CACP

## 2011-06-26 ENCOUNTER — Ambulatory Visit (INDEPENDENT_AMBULATORY_CARE_PROVIDER_SITE_OTHER): Payer: Medicare Other | Admitting: Pharmacist

## 2011-06-26 DIAGNOSIS — I749 Embolism and thrombosis of unspecified artery: Secondary | ICD-10-CM

## 2011-06-26 DIAGNOSIS — Z7901 Long term (current) use of anticoagulants: Secondary | ICD-10-CM

## 2011-06-26 LAB — POCT INR: INR: 2

## 2011-06-26 NOTE — Progress Notes (Signed)
Anti-Coagulation Progress Note  [name] is a 50 y.o. male who is currently on an anti-coagulation regimen.    RECENT RESULTS: Recent results are below, the most recent result is correlated with a dose of 47.5 mg. per week: Lab Results  Component Value Date   INR 2.0 06/26/2011   INR 4.10 06/12/2011   INR 3.80 05/15/2011   PROTIME 39.6* 03/24/2010    ANTI-COAG DOSE:   Latest dosing instructions   Total Sun Mon Tue Wed Thu Fri Sat   50 7.5 mg 7.5 mg 7.5 mg 7.5 mg 5 mg 7.5 mg 7.5 mg    (5 mg1.5) (5 mg1.5) (5 mg1.5) (5 mg1.5) (5 mg1) (5 mg1.5) (5 mg1.5)         ANTICOAG SUMMARY: Anticoagulation Episode Summary              Current INR goal 2.0-3.0 Next INR check 07/24/2011   INR from last check 2.0 (06/26/2011)     Weekly max dose (mg)  Target end date Indefinite   Indications EMBOLISM AND THROMBOSIS, ARTERY, Long term current use of anticoagulant   INR check location [facility]    Send INR reminders to [facility] IMP   Comments        Provider Role Specialty Phone number   Cliffton Asters, MD  Infectious Diseases [phone]        ANTICOAG TODAY: Anticoagulation Summary as of 06/26/2011              INR goal 2.0-3.0     Selected INR 2.0 (06/26/2011) Next INR check 07/24/2011   Weekly max dose (mg)  Target end date Indefinite   Indications EMBOLISM AND THROMBOSIS, ARTERY, Long term current use of anticoagulant    Anticoagulation Episode Summary              INR check location [facility]    Send INR reminders to [facility] IMP   Comments        Provider Role Specialty Phone number   Cliffton Asters, MD  Infectious Diseases [phone]        PATIENT INSTRUCTIONS: Patient Instructions  Patient instructed to take medications as defined in the Anti-coagulation Track section of this encounter.  Patient instructed to take today's dose.  Patient verbalized understanding of these instructions.        FOLLOW-UP Return in 4  weeks (on 07/24/2011) for Follow up INR.  [name], [name] Pharm.D., CACP

## 2011-06-26 NOTE — Patient Instructions (Signed)
Patient instructed to take medications as defined in the Anti-coagulation Track section of this encounter.  Patient instructed to take today's dose.  Patient verbalized understanding of these instructions.    

## 2011-07-10 ENCOUNTER — Other Ambulatory Visit: Payer: Self-pay | Admitting: *Deleted

## 2011-07-10 ENCOUNTER — Ambulatory Visit: Payer: Medicare Other

## 2011-07-10 DIAGNOSIS — G547 Phantom limb syndrome without pain: Secondary | ICD-10-CM

## 2011-07-10 DIAGNOSIS — G8929 Other chronic pain: Secondary | ICD-10-CM

## 2011-07-10 MED ORDER — MORPHINE SULFATE CR 30 MG PO TB12: 30.0000 mg | ORAL_TABLET | Freq: Two times a day (BID) | ORAL | Status: DC

## 2011-07-10 MED ORDER — OXYCODONE HCL 5 MG PO TABS: 5.0000 mg | ORAL_TABLET | Freq: Two times a day (BID) | ORAL | Status: DC

## 2011-07-10 MED ORDER — MORPHINE SULFATE CR 60 MG PO TB12: 60.0000 mg | ORAL_TABLET | Freq: Two times a day (BID) | ORAL | Status: DC

## 2011-07-17 ENCOUNTER — Other Ambulatory Visit: Payer: Self-pay | Admitting: *Deleted

## 2011-07-17 DIAGNOSIS — R634 Abnormal weight loss: Secondary | ICD-10-CM

## 2011-07-17 MED ORDER — ENSURE PO LIQD: 1.0000 | Freq: Three times a day (TID) | ORAL | Status: DC

## 2011-07-24 ENCOUNTER — Other Ambulatory Visit: Payer: Self-pay | Admitting: *Deleted

## 2011-07-24 ENCOUNTER — Ambulatory Visit (INDEPENDENT_AMBULATORY_CARE_PROVIDER_SITE_OTHER): Payer: Medicare Other | Admitting: Pharmacist

## 2011-07-24 DIAGNOSIS — Z7901 Long term (current) use of anticoagulants: Secondary | ICD-10-CM

## 2011-07-24 DIAGNOSIS — R634 Abnormal weight loss: Secondary | ICD-10-CM

## 2011-07-24 DIAGNOSIS — I749 Embolism and thrombosis of unspecified artery: Secondary | ICD-10-CM

## 2011-07-24 LAB — POCT INR: INR: 3

## 2011-07-24 LAB — T-HELPER CELL (CD4) - (RCID CLINIC ONLY)
CD4 % Helper T Cell: 23 — ABNORMAL LOW
CD4 T Cell Abs: 1160

## 2011-07-24 MED ORDER — ENSURE PO LIQD: 1.0000 | Freq: Three times a day (TID) | ORAL | Status: DC

## 2011-07-24 NOTE — Telephone Encounter (Signed)
RX given to [facility] Case Manager, [name].  Pt called and told to call [facility] so that he will be able to pick up Ensure today.  Pt verbalized understanding.  Jennet Maduro, RN

## 2011-07-24 NOTE — Progress Notes (Signed)
Will forward to Dr. Orvan Falconer, patient's PCP, for review.

## 2011-07-24 NOTE — Progress Notes (Signed)
Anti-Coagulation Progress Note  [name] is a 50 y.o. male who is currently on an anti-coagulation regimen.    RECENT RESULTS: Recent results are below, the most recent result is correlated with a dose of 50 mg. per week: Lab Results  Component Value Date   INR 3.0 07/24/2011   INR 2.0 06/26/2011   INR 4.10 06/12/2011   PROTIME 39.6* 03/24/2010    ANTI-COAG DOSE:   Latest dosing instructions   Total Sun Mon Tue Wed Thu Fri Sat   47.5 7.5 mg 5 mg 7.5 mg 7.5 mg 5 mg 7.5 mg 7.5 mg    (5 mg1.5) (5 mg1) (5 mg1.5) (5 mg1.5) (5 mg1) (5 mg1.5) (5 mg1.5)         ANTICOAG SUMMARY: Anticoagulation Episode Summary              Current INR goal 2.0-3.0 Next INR check 08/21/2011   INR from last check 3.0 (07/24/2011)     Weekly max dose (mg)  Target end date Indefinite   Indications EMBOLISM AND THROMBOSIS, ARTERY, Long term current use of anticoagulant   INR check location [facility]    Send INR reminders to [facility]   Comments        Provider Role Specialty Phone number   Cliffton Asters, MD  Infectious Diseases [phone]        ANTICOAG TODAY: Anticoagulation Summary as of 07/24/2011              INR goal 2.0-3.0     Selected INR 3.0 (07/24/2011) Next INR check 08/21/2011   Weekly max dose (mg)  Target end date Indefinite   Indications EMBOLISM AND THROMBOSIS, ARTERY, Long term current use of anticoagulant    Anticoagulation Episode Summary              INR check location [facility]    Send INR reminders to [facility] IMP   Comments        Provider Role Specialty Phone number   Cliffton Asters, MD  Infectious Diseases [phone]        PATIENT INSTRUCTIONS: Patient Instructions  Patient instructed to take medications as defined in the Anti-coagulation Track section of this encounter.  Patient instructed to take today's dose.  Patient verbalized understanding of these instructions.        FOLLOW-UP Return in 4  weeks (on 08/21/2011) for Follow up INR.  [name], [name] Pharm.D., CACP

## 2011-07-24 NOTE — Patient Instructions (Signed)
Patient instructed to take medications as defined in the Anti-coagulation Track section of this encounter.  Patient instructed to take today's dose.  Patient verbalized understanding of these instructions.    

## 2011-08-08 ENCOUNTER — Other Ambulatory Visit: Payer: Self-pay | Admitting: *Deleted

## 2011-08-08 DIAGNOSIS — G547 Phantom limb syndrome without pain: Secondary | ICD-10-CM

## 2011-08-08 DIAGNOSIS — G8929 Other chronic pain: Secondary | ICD-10-CM

## 2011-08-08 MED ORDER — OXYCODONE HCL 5 MG PO TABS: 5.0000 mg | ORAL_TABLET | Freq: Two times a day (BID) | ORAL | Status: DC

## 2011-08-08 MED ORDER — MORPHINE SULFATE CR 30 MG PO TB12: 30.0000 mg | ORAL_TABLET | Freq: Two times a day (BID) | ORAL | Status: DC

## 2011-08-08 MED ORDER — MORPHINE SULFATE CR 60 MG PO TB12: 60.0000 mg | ORAL_TABLET | Freq: Two times a day (BID) | ORAL | Status: DC

## 2011-08-08 NOTE — Telephone Encounter (Signed)
He called asking for refill of his pain meds. Told him md will sign it tomorrow pm & he can get it Thursday am

## 2011-08-10 ENCOUNTER — Other Ambulatory Visit: Payer: Self-pay | Admitting: *Deleted

## 2011-08-10 DIAGNOSIS — G547 Phantom limb syndrome without pain: Secondary | ICD-10-CM

## 2011-08-10 DIAGNOSIS — G8929 Other chronic pain: Secondary | ICD-10-CM

## 2011-08-10 MED ORDER — OXYCODONE HCL 5 MG PO TABA: 1.0000 | ORAL_TABLET | Freq: Three times a day (TID) | ORAL | Status: DC

## 2011-08-10 MED ORDER — MORPHINE SULFATE CR 60 MG PO TB12: 60.0000 mg | ORAL_TABLET | Freq: Two times a day (BID) | ORAL | Status: DC

## 2011-08-10 MED ORDER — MORPHINE SULFATE CR 30 MG PO TB12: 30.0000 mg | ORAL_TABLET | Freq: Two times a day (BID) | ORAL | Status: DC

## 2011-08-10 NOTE — Telephone Encounter (Signed)
His md is not here to sign the rxs. I reprinted them & asked Dr./ Comer to sign them. Pt is here waiting

## 2011-08-21 ENCOUNTER — Ambulatory Visit (INDEPENDENT_AMBULATORY_CARE_PROVIDER_SITE_OTHER): Payer: Medicare Other | Admitting: Pharmacist

## 2011-08-21 DIAGNOSIS — Z7901 Long term (current) use of anticoagulants: Secondary | ICD-10-CM

## 2011-08-21 DIAGNOSIS — I749 Embolism and thrombosis of unspecified artery: Secondary | ICD-10-CM

## 2011-08-21 NOTE — Progress Notes (Signed)
Anti-Coagulation Progress Note  Jen Eppinger is a 50 y.o. male who is currently on an anti-coagulation regimen.    RECENT RESULTS: Recent results are below, the most recent result is correlated with a dose of 47.5 mg. per week: Lab Results  Component Value Date   INR 2.70 08/21/2011   INR 3.0 07/24/2011   INR 2.0 06/26/2011   PROTIME 39.6* 03/24/2010    ANTI-COAG DOSE:   Latest dosing instructions   Total Sun Mon Tue Wed Thu Fri Sat   47.5 7.5 mg 5 mg 7.5 mg 7.5 mg 5 mg 7.5 mg 7.5 mg    (5 mg1.5) (5 mg1) (5 mg1.5) (5 mg1.5) (5 mg1) (5 mg1.5) (5 mg1.5)         ANTICOAG SUMMARY: Anticoagulation Episode Summary              Current INR goal 2.0-3.0 Next INR check 09/18/2011   INR from last check 2.70 (08/21/2011)     Weekly max dose (mg)  Target end date Indefinite   Indications EMBOLISM AND THROMBOSIS, ARTERY, Long term current use of anticoagulant   INR check location [facility]    Send INR reminders to [facility] IMP   Comments        Provider Role Specialty Phone number   Cliffton Asters, MD  Infectious Diseases [phone]        ANTICOAG TODAY: Anticoagulation Summary as of 08/21/2011              INR goal 2.0-3.0     Selected INR 2.70 (08/21/2011) Next INR check 09/18/2011   Weekly max dose (mg)  Target end date Indefinite   Indications EMBOLISM AND THROMBOSIS, ARTERY, Long term current use of anticoagulant    Anticoagulation Episode Summary              INR check location [facility]    Send INR reminders to [facility] IMP   Comments        Provider Role Specialty Phone number   Cliffton Asters, MD  Infectious Diseases [phone]        PATIENT INSTRUCTIONS: Patient Instructions  Patient instructed to take medications as defined in the Anti-coagulation Track section of this encounter.  Patient instructed to take today's dose.  Patient verbalized understanding of these instructions.        FOLLOW-UP Return  in 4 weeks (on 09/18/2011) for Follow up INR.  [name], [name] Pharm.D., CACP

## 2011-08-21 NOTE — Patient Instructions (Signed)
Patient instructed to take medications as defined in the Anti-coagulation Track section of this encounter.  Patient instructed to take today's dose.  Patient verbalized understanding of these instructions.    

## 2011-08-27 ENCOUNTER — Other Ambulatory Visit: Payer: Self-pay | Admitting: Internal Medicine

## 2011-08-27 DIAGNOSIS — G629 Polyneuropathy, unspecified: Secondary | ICD-10-CM

## 2011-08-28 ENCOUNTER — Other Ambulatory Visit: Payer: Self-pay | Admitting: *Deleted

## 2011-09-05 ENCOUNTER — Other Ambulatory Visit: Payer: Self-pay | Admitting: Internal Medicine

## 2011-09-05 ENCOUNTER — Other Ambulatory Visit (INDEPENDENT_AMBULATORY_CARE_PROVIDER_SITE_OTHER): Payer: Medicare Other

## 2011-09-05 DIAGNOSIS — B20 Human immunodeficiency virus [HIV] disease: Secondary | ICD-10-CM

## 2011-09-06 LAB — T-HELPER CELL (CD4) - (RCID CLINIC ONLY): CD4 % Helper T Cell: 24 % — ABNORMAL LOW (ref 33–55)

## 2011-09-07 ENCOUNTER — Other Ambulatory Visit: Payer: Self-pay | Admitting: *Deleted

## 2011-09-07 DIAGNOSIS — G8929 Other chronic pain: Secondary | ICD-10-CM

## 2011-09-07 DIAGNOSIS — G547 Phantom limb syndrome without pain: Secondary | ICD-10-CM

## 2011-09-07 LAB — HIV-1 RNA QUANT-NO REFLEX-BLD
HIV 1 RNA Quant: <20 copies/mL (ref <20)
HIV-1 RNA Quant, Log: <1.3 {Log} (ref <1.30)

## 2011-09-07 MED ORDER — OXYCODONE HCL 5 MG PO TABA: 1.0000 | ORAL_TABLET | Freq: Three times a day (TID) | ORAL | Status: DC

## 2011-09-07 MED ORDER — MORPHINE SULFATE CR 30 MG PO TB12: 30.0000 mg | ORAL_TABLET | Freq: Two times a day (BID) | ORAL | Status: DC

## 2011-09-07 MED ORDER — MORPHINE SULFATE CR 60 MG PO TB12: 60.0000 mg | ORAL_TABLET | Freq: Two times a day (BID) | ORAL | Status: DC

## 2011-09-07 NOTE — Telephone Encounter (Signed)
There was only 1 Rx for each medication actually printed. There was something wrong with the printers so it was sent more times than it came out.

## 2011-09-18 ENCOUNTER — Ambulatory Visit (INDEPENDENT_AMBULATORY_CARE_PROVIDER_SITE_OTHER): Payer: Medicare Other | Admitting: Pharmacist

## 2011-09-18 DIAGNOSIS — Z9911 Dependence on respirator [ventilator] status: Secondary | ICD-10-CM

## 2011-09-18 DIAGNOSIS — I749 Embolism and thrombosis of unspecified artery: Secondary | ICD-10-CM

## 2011-09-18 DIAGNOSIS — Z7901 Long term (current) use of anticoagulants: Secondary | ICD-10-CM

## 2011-09-18 LAB — POCT INR: INR: 3.9

## 2011-09-18 NOTE — Progress Notes (Signed)
Anti-Coagulation Progress Note  [name] is a 50 y.o. male who is currently on an anti-coagulation regimen.    RECENT RESULTS: Recent results are below, the most recent result is correlated with a dose of 47.5 mg. per week: Lab Results  Component Value Date   INR 3.90 09/18/2011   INR 2.70 08/21/2011   INR 3.0 07/24/2011   PROTIME 39.6* 03/24/2010    ANTI-COAG DOSE:   Latest dosing instructions   Total Sun Mon Tue Wed Thu Fri Sat   42.5 7.5 mg 5 mg 5 mg 5 mg 7.5 mg 5 mg 7.5 mg    (5 mg1.5) (5 mg1) (5 mg1) (5 mg1) (5 mg1.5) (5 mg1) (5 mg1.5)         ANTICOAG SUMMARY: Anticoagulation Episode Summary              Current INR goal 2.0-3.0 Next INR check 10/16/2011   INR from last check 3.90! (09/18/2011)     Weekly max dose (mg)  Target end date Indefinite   Indications EMBOLISM AND THROMBOSIS, ARTERY, Long term current use of anticoagulant   INR check location [facility]    Send INR reminders to [facility] IMP   Comments        Provider Role Specialty Phone number   Cliffton Asters, MD  Infectious Diseases [phone]        ANTICOAG TODAY: Anticoagulation Summary as of 09/18/2011              INR goal 2.0-3.0     Selected INR 3.90! (09/18/2011) Next INR check 10/16/2011   Weekly max dose (mg)  Target end date Indefinite   Indications EMBOLISM AND THROMBOSIS, ARTERY, Long term current use of anticoagulant    Anticoagulation Episode Summary              INR check location [facility]    Send INR reminders to [facility] IMP   Comments        Provider Role Specialty Phone number   Cliffton Asters, MD  Infectious Diseases [phone]        PATIENT INSTRUCTIONS: Patient Instructions  Patient instructed to take medications as defined in the Anti-coagulation Track section of this encounter.  Patient instructed to take today's dose.  Patient verbalized understanding of these instructions.        FOLLOW-UP Return in 4  weeks (on 10/16/2011) for Follow up INR.  [name], [name] Pharm.D., CACP

## 2011-09-18 NOTE — Patient Instructions (Signed)
Patient instructed to take medications as defined in the Anti-coagulation Track section of this encounter.  Patient instructed to take today's dose.  Patient verbalized understanding of these instructions.    

## 2011-09-26 ENCOUNTER — Encounter: Payer: Self-pay | Admitting: Internal Medicine

## 2011-09-26 ENCOUNTER — Ambulatory Visit (INDEPENDENT_AMBULATORY_CARE_PROVIDER_SITE_OTHER): Payer: Medicare Other | Admitting: Internal Medicine

## 2011-09-26 VITALS — BP 163/77 | HR 86 | Temp 98.4°F | Ht 72.0 in | Wt 151.2 lb

## 2011-09-26 DIAGNOSIS — G547 Phantom limb syndrome without pain: Secondary | ICD-10-CM

## 2011-09-26 DIAGNOSIS — B20 Human immunodeficiency virus [HIV] disease: Secondary | ICD-10-CM

## 2011-09-26 DIAGNOSIS — Z79899 Other long term (current) drug therapy: Secondary | ICD-10-CM

## 2011-09-26 DIAGNOSIS — Z23 Encounter for immunization: Secondary | ICD-10-CM

## 2011-09-26 DIAGNOSIS — Z113 Encounter for screening for infections with a predominantly sexual mode of transmission: Secondary | ICD-10-CM

## 2011-09-26 DIAGNOSIS — E785 Hyperlipidemia, unspecified: Secondary | ICD-10-CM

## 2011-09-26 MED ORDER — OMEGA-3 FATTY ACIDS 1000 MG PO CAPS: 2.0000 g | ORAL_CAPSULE | Freq: Every day | ORAL | Status: DC

## 2011-09-26 NOTE — Assessment & Plan Note (Signed)
I will start him on fish oil supplements.

## 2011-09-26 NOTE — Assessment & Plan Note (Signed)
His infection remains under excellent control. I will continue his current regimen. 

## 2011-09-26 NOTE — Assessment & Plan Note (Signed)
I will continue his current pain regimen.

## 2011-09-26 NOTE — Progress Notes (Signed)
  Subjective:    Patient ID: [name], male    DOB: 30-Jul-1961, 50 y.o.   MRN: [id number]  HPI Wanda is in for his routine visit. He states that he is crying less and that he and his daughter are doing better since Joan's death. He is missed only one dose of his HIV medicines when his pharmacy was late with refills. His chronic phantom leg pain remains under good control with his current regimen. He is very adherent with his Coumadin regimen under the direction of Dr. Alexandria Lodge.    Review of Systems     Objective:   Physical Exam  Constitutional: He appears well-developed and well-nourished. No distress.  HENT:  Mouth/Throat: Oropharynx is clear and moist. No oropharyngeal exudate.  Eyes: Conjunctivae are normal.  Cardiovascular: Normal rate and normal heart sounds.   No murmur heard. Pulmonary/Chest: Breath sounds normal. He has no wheezes. He has no rales.  Skin: No rash noted.  Psychiatric: He has a normal mood and affect.          Assessment & Plan:   HIV 1 RNA Quant (copies/mL)  Date Value  09/05/2011 <20   02/13/2011 119*  11/15/2010 <20 copies/mL      CD4 T Cell Abs (cmm)  Date Value  09/05/2011 1240   02/13/2011 1710   11/15/2010 1090

## 2011-10-04 ENCOUNTER — Other Ambulatory Visit: Payer: Self-pay | Admitting: *Deleted

## 2011-10-04 DIAGNOSIS — G8929 Other chronic pain: Secondary | ICD-10-CM

## 2011-10-04 DIAGNOSIS — G547 Phantom limb syndrome without pain: Secondary | ICD-10-CM

## 2011-10-04 MED ORDER — MORPHINE SULFATE CR 60 MG PO TB12: 60.0000 mg | ORAL_TABLET | Freq: Two times a day (BID) | ORAL | Status: DC

## 2011-10-04 MED ORDER — OXYCODONE HCL 5 MG PO TABS: 5.0000 mg | ORAL_TABLET | Freq: Two times a day (BID) | ORAL | Status: DC

## 2011-10-04 MED ORDER — MORPHINE SULFATE CR 30 MG PO TB12: 30.0000 mg | ORAL_TABLET | Freq: Two times a day (BID) | ORAL | Status: DC

## 2011-10-06 ENCOUNTER — Other Ambulatory Visit: Payer: Self-pay | Admitting: *Deleted

## 2011-10-06 NOTE — Telephone Encounter (Signed)
He stopped by for his pain med rxs

## 2011-10-16 ENCOUNTER — Ambulatory Visit (INDEPENDENT_AMBULATORY_CARE_PROVIDER_SITE_OTHER): Payer: Medicare Other | Admitting: Pharmacist

## 2011-10-16 DIAGNOSIS — I749 Embolism and thrombosis of unspecified artery: Secondary | ICD-10-CM

## 2011-10-16 DIAGNOSIS — Z7901 Long term (current) use of anticoagulants: Secondary | ICD-10-CM

## 2011-10-16 LAB — POCT INR: INR: 2.1

## 2011-10-16 NOTE — Progress Notes (Signed)
Anti-Coagulation Progress Note  [name] is a 50 y.o. male who is currently on an anti-coagulation regimen.    RECENT RESULTS: Recent results are below, the most recent result is correlated with a dose of 40 mg. per week: Lab Results  Component Value Date   INR 2.10 10/16/2011   INR 3.90 09/18/2011   INR 2.70 08/21/2011   PROTIME 39.6* 03/24/2010    ANTI-COAG DOSE:   Latest dosing instructions   Total Sun Mon Tue Wed Thu Fri Sat   45 5 mg 7.5 mg 7.5 mg 5 mg 7.5 mg 5 mg 7.5 mg    (5 mg1) (5 mg1.5) (5 mg1.5) (5 mg1) (5 mg1.5) (5 mg1) (5 mg1.5)         ANTICOAG SUMMARY: Anticoagulation Episode Summary              Current INR goal 2.0-3.0 Next INR check 11/13/2011   INR from last check 2.10 (10/16/2011)     Weekly max dose (mg)  Target end date Indefinite   Indications EMBOLISM AND THROMBOSIS, ARTERY, Long term current use of anticoagulant   INR check location [facility]    Send INR reminders to [facility] IMP   Comments        Provider Role Specialty Phone number   Cliffton Asters, MD  Infectious Diseases [phone]        ANTICOAG TODAY: Anticoagulation Summary as of 10/16/2011              INR goal 2.0-3.0     Selected INR 2.10 (10/16/2011) Next INR check 11/13/2011   Weekly max dose (mg)  Target end date Indefinite   Indications EMBOLISM AND THROMBOSIS, ARTERY, Long term current use of anticoagulant    Anticoagulation Episode Summary              INR check location [facility] Pref[facility]    Send INR reminders to [facility] IMP   Comments        Provider Role Specialty Phone number   Cliffton Asters, MD  Infectious Diseases [phone]        PATIENT INSTRUCTIONS: Patient Instructions  Patient instructed to take medications as defined in the Anti-coagulation Track section of this encounter.  Patient instructed to take today's dose.  Patient verbalized understanding of these instructions.        FOLLOW-UP Return for  Follow up INR.  [name], [name] Pharm.D., CACP

## 2011-10-16 NOTE — Patient Instructions (Signed)
Patient instructed to take medications as defined in the Anti-coagulation Track section of this encounter.  Patient instructed to take today's dose.  Patient verbalized understanding of these instructions.    

## 2011-11-07 ENCOUNTER — Other Ambulatory Visit: Payer: Self-pay | Admitting: Licensed Clinical Social Worker

## 2011-11-07 DIAGNOSIS — G8929 Other chronic pain: Secondary | ICD-10-CM

## 2011-11-07 DIAGNOSIS — G547 Phantom limb syndrome without pain: Secondary | ICD-10-CM

## 2011-11-07 MED ORDER — MORPHINE SULFATE CR 30 MG PO TB12: 30.0000 mg | ORAL_TABLET | Freq: Two times a day (BID) | ORAL | Status: DC

## 2011-11-07 MED ORDER — OXYCODONE HCL 5 MG PO TABS: 5.0000 mg | ORAL_TABLET | Freq: Two times a day (BID) | ORAL | Status: DC

## 2011-11-07 MED ORDER — MORPHINE SULFATE CR 60 MG PO TB12: 60.0000 mg | ORAL_TABLET | Freq: Two times a day (BID) | ORAL | Status: DC

## 2011-11-13 ENCOUNTER — Ambulatory Visit (INDEPENDENT_AMBULATORY_CARE_PROVIDER_SITE_OTHER): Payer: Medicare Other

## 2011-11-13 DIAGNOSIS — I749 Embolism and thrombosis of unspecified artery: Secondary | ICD-10-CM | POA: Diagnosis not present

## 2011-11-13 DIAGNOSIS — Z7901 Long term (current) use of anticoagulants: Secondary | ICD-10-CM

## 2011-11-28 ENCOUNTER — Other Ambulatory Visit: Payer: Self-pay | Admitting: Internal Medicine

## 2011-11-28 DIAGNOSIS — B20 Human immunodeficiency virus [HIV] disease: Secondary | ICD-10-CM

## 2011-12-06 ENCOUNTER — Other Ambulatory Visit: Payer: Self-pay | Admitting: *Deleted

## 2011-12-06 DIAGNOSIS — G547 Phantom limb syndrome without pain: Secondary | ICD-10-CM

## 2011-12-06 DIAGNOSIS — G8929 Other chronic pain: Secondary | ICD-10-CM

## 2011-12-06 MED ORDER — MORPHINE SULFATE CR 60 MG PO TB12: 60.0000 mg | ORAL_TABLET | Freq: Two times a day (BID) | ORAL | Status: DC

## 2011-12-06 MED ORDER — MORPHINE SULFATE CR 30 MG PO TB12: 30.0000 mg | ORAL_TABLET | Freq: Two times a day (BID) | ORAL | Status: DC

## 2011-12-06 MED ORDER — OXYCODONE HCL 5 MG PO TABS: 5.0000 mg | ORAL_TABLET | Freq: Two times a day (BID) | ORAL | Status: DC

## 2011-12-06 NOTE — Telephone Encounter (Signed)
Asked for refills. States he wants to pick them up tomorrow. Told him md will be here this afternoon. May get tomorrow

## 2011-12-11 ENCOUNTER — Ambulatory Visit (INDEPENDENT_AMBULATORY_CARE_PROVIDER_SITE_OTHER): Payer: Medicare Other | Admitting: Pharmacist

## 2011-12-11 DIAGNOSIS — Z7901 Long term (current) use of anticoagulants: Secondary | ICD-10-CM

## 2011-12-11 DIAGNOSIS — I749 Embolism and thrombosis of unspecified artery: Secondary | ICD-10-CM | POA: Diagnosis not present

## 2011-12-11 NOTE — Progress Notes (Signed)
Anti-Coagulation Progress Note  [name] is a 51 y.o. male who is currently on an anti-coagulation regimen.    RECENT RESULTS: Recent results are below, the most recent result is correlated with a dose of 45 mg. per week: Lab Results  Component Value Date   INR 2.3 11/13/2011   INR 2.10 10/16/2011   INR 3.90 09/18/2011   PROTIME 39.6* 03/24/2010    ANTI-COAG DOSE:   Latest dosing instructions   Total Sun Mon Tue Wed Thu Fri Sat   50 5 mg 7.5 mg 7.5 mg 7.5 mg 7.5 mg 7.5 mg 7.5 mg    (5 mg1) (5 mg1.5) (5 mg1.5) (5 mg1.5) (5 mg1.5) (5 mg1.5) (5 mg1.5)         ANTICOAG SUMMARY: Anticoagulation Episode Summary              Current INR goal 2.0-3.0 Next INR check 01/08/2012   INR from last check 2.3 (11/13/2011)     Weekly max dose (mg)  Target end date Indefinite   Indications EMBOLISM AND THROMBOSIS, ARTERY, Long term current use of anticoagulant   [facility]    Send INR reminders to [facility]   Comments        Provider Role Specialty Phone number   Cliffton Asters, MD  Infectious Diseases [phone]        ANTICOAG TODAY: Anticoagulation Summary as of 12/11/2011              INR goal 2.0-3.0     Selected INR 2.3 (11/13/2011) Next INR check 01/08/2012   Weekly max dose (mg)  Target end date Indefinite   Indications EMBOLISM AND THROMBOSIS, ARTERY, Long term current use of anticoagulant    Anticoagulation Episode Summary              INR check location [facility] Pref[facility]    Send INR reminders to [facility]   Comments        Provider Role Specialty Phone number   Cliffton Asters, MD  Infectious Diseases [phone]        PATIENT INSTRUCTIONS: Patient Instructions  Patient instructed to take medications as defined in the Anti-coagulation Track section of this encounter.  Patient instructed to take today's dose.  Patient verbalized understanding of these instructions.        FOLLOW-UP Return in 4  weeks (on 01/08/2012) for Follow up INR.  [name], [name] Pharm.D., CACP

## 2011-12-11 NOTE — Patient Instructions (Signed)
Patient instructed to take medications as defined in the Anti-coagulation Track section of this encounter.  Patient instructed to take today's dose.  Patient verbalized understanding of these instructions.    

## 2011-12-31 ENCOUNTER — Other Ambulatory Visit: Payer: Self-pay | Admitting: Internal Medicine

## 2012-01-01 ENCOUNTER — Other Ambulatory Visit: Payer: Self-pay | Admitting: *Deleted

## 2012-01-01 DIAGNOSIS — B20 Human immunodeficiency virus [HIV] disease: Secondary | ICD-10-CM

## 2012-01-01 MED ORDER — RITONAVIR 100 MG PO TABS: 100.0000 mg | ORAL_TABLET | Freq: Every day | ORAL | Status: DC

## 2012-01-02 ENCOUNTER — Telehealth: Payer: Self-pay | Admitting: *Deleted

## 2012-01-02 DIAGNOSIS — R634 Abnormal weight loss: Secondary | ICD-10-CM

## 2012-01-02 DIAGNOSIS — G8929 Other chronic pain: Secondary | ICD-10-CM

## 2012-01-02 DIAGNOSIS — G547 Phantom limb syndrome without pain: Secondary | ICD-10-CM

## 2012-01-02 DIAGNOSIS — B20 Human immunodeficiency virus [HIV] disease: Secondary | ICD-10-CM

## 2012-01-02 MED ORDER — MORPHINE SULFATE CR 30 MG PO TB12: 30.0000 mg | ORAL_TABLET | Freq: Two times a day (BID) | ORAL | Status: DC

## 2012-01-02 MED ORDER — ENSURE PO LIQD: 1.0000 | Freq: Three times a day (TID) | ORAL | Status: DC

## 2012-01-02 MED ORDER — OXYCODONE HCL 5 MG PO TABS: 5.0000 mg | ORAL_TABLET | Freq: Two times a day (BID) | ORAL | Status: DC

## 2012-01-02 MED ORDER — TENOFOVIR DISOPROXIL FUMARATE 300 MG PO TABS: 300.0000 mg | ORAL_TABLET | Freq: Every day | ORAL | Status: DC

## 2012-01-02 MED ORDER — MORPHINE SULFATE CR 60 MG PO TB12: 60.0000 mg | ORAL_TABLET | Freq: Two times a day (BID) | ORAL | Status: DC

## 2012-01-02 NOTE — Telephone Encounter (Signed)
He has several questions. I called him back & left a message for him to call me again

## 2012-01-02 NOTE — Telephone Encounter (Signed)
He needed vired sent today as he has none left. I verified which pharmacy & sent it. I told him the ensure has been taken care of. He wants to pick up his pain meds Friday. I told him I will have the MD sign them

## 2012-01-03 ENCOUNTER — Other Ambulatory Visit: Payer: Self-pay | Admitting: *Deleted

## 2012-01-03 DIAGNOSIS — B20 Human immunodeficiency virus [HIV] disease: Secondary | ICD-10-CM

## 2012-01-03 MED ORDER — RITONAVIR 100 MG PO TABS: 100.0000 mg | ORAL_TABLET | Freq: Every day | ORAL | Status: DC

## 2012-01-08 ENCOUNTER — Ambulatory Visit (INDEPENDENT_AMBULATORY_CARE_PROVIDER_SITE_OTHER): Payer: Medicare Other | Admitting: Pharmacist

## 2012-01-08 DIAGNOSIS — Z7901 Long term (current) use of anticoagulants: Secondary | ICD-10-CM | POA: Diagnosis not present

## 2012-01-08 DIAGNOSIS — I749 Embolism and thrombosis of unspecified artery: Secondary | ICD-10-CM

## 2012-01-08 MED ORDER — WARFARIN SODIUM 5 MG PO TABS: ORAL_TABLET | ORAL | Status: DC

## 2012-01-08 NOTE — Progress Notes (Signed)
Anti-Coagulation Progress Note  [name] is a 51 y.o. male who is currently on an anti-coagulation regimen.    RECENT RESULTS: Recent results are below, the most recent result is correlated with a dose of 50 mg. per week: Lab Results  Component Value Date   INR 2.80 01/08/2012   INR 2.3 11/13/2011   INR 2.10 10/16/2011   PROTIME 39.6* 03/24/2010    ANTI-COAG DOSE:   Latest dosing instructions   Total Sun Mon Tue Wed Thu Fri Sat   50 5 mg 7.5 mg 7.5 mg 7.5 mg 7.5 mg 7.5 mg 7.5 mg    (5 mg1) (5 mg1.5) (5 mg1.5) (5 mg1.5) (5 mg1.5) (5 mg1.5) (5 mg1.5)         ANTICOAG SUMMARY: Anticoagulation Episode Summary              Current INR goal 2.0-3.0 Next INR check 02/05/2012   INR from last check 2.80 (01/08/2012)     Weekly max dose (mg)  Target end date Indefinite   Indications EMBOLISM AND THROMBOSIS, ARTERY, Long term current use of anticoagulant   [facility]    Send INR reminders to [facility]   Comments        Provider Role Specialty Phone number   Cliffton Asters, MD  Infectious Diseases [phone]        ANTICOAG TODAY: Anticoagulation Summary as of 01/08/2012              INR goal 2.0-3.0     Selected INR 2.80 (01/08/2012) Next INR check 02/05/2012   Weekly max dose (mg)  Target end date Indefinite   Indications EMBOLISM AND THROMBOSIS, ARTERY, Long term current use of anticoagulant    Anticoagulation Episode Summary              INR check location [facility] Preferred lab    Send INR reminders to [facility]   Comments        Provider Role Specialty Phone number   Cliffton Asters, MD  Infectious Diseases [phone]        PATIENT INSTRUCTIONS: Patient Instructions  Patient instructed to take medications as defined in the Anti-coagulation Track section of this encounter.  Patient instructed to take today's dose.  Patient verbalized understanding of these instructions.        FOLLOW-UP Return in 4  weeks (on 02/05/2012) for Follow up INR.  [name], [name] Pharm.D., CACP

## 2012-01-08 NOTE — Patient Instructions (Signed)
Patient instructed to take medications as defined in the Anti-coagulation Track section of this encounter.  Patient instructed to take today's dose.  Patient verbalized understanding of these instructions.    

## 2012-02-02 ENCOUNTER — Other Ambulatory Visit: Payer: Self-pay | Admitting: *Deleted

## 2012-02-02 DIAGNOSIS — G547 Phantom limb syndrome without pain: Secondary | ICD-10-CM

## 2012-02-02 DIAGNOSIS — G8929 Other chronic pain: Secondary | ICD-10-CM

## 2012-02-02 MED ORDER — OXYCODONE HCL 5 MG PO TABS: 5.0000 mg | ORAL_TABLET | Freq: Two times a day (BID) | ORAL | Status: DC

## 2012-02-02 MED ORDER — MORPHINE SULFATE CR 60 MG PO TB12: 60.0000 mg | ORAL_TABLET | Freq: Two times a day (BID) | ORAL | Status: DC

## 2012-02-02 MED ORDER — MORPHINE SULFATE CR 30 MG PO TB12: 30.0000 mg | ORAL_TABLET | Freq: Two times a day (BID) | ORAL | Status: DC

## 2012-02-05 ENCOUNTER — Ambulatory Visit (INDEPENDENT_AMBULATORY_CARE_PROVIDER_SITE_OTHER): Payer: Medicare Other | Admitting: Pharmacist

## 2012-02-05 DIAGNOSIS — I749 Embolism and thrombosis of unspecified artery: Secondary | ICD-10-CM | POA: Diagnosis not present

## 2012-02-05 DIAGNOSIS — Z7901 Long term (current) use of anticoagulants: Secondary | ICD-10-CM | POA: Diagnosis not present

## 2012-02-05 NOTE — Patient Instructions (Signed)
Patient instructed to take medications as defined in the Anti-coagulation Track section of this encounter.  Patient instructed to take today's dose.  Patient verbalized understanding of these instructions.    

## 2012-02-05 NOTE — Progress Notes (Signed)
Anti-Coagulation Progress Note  [name] is a 51 y.o. male who is currently on an anti-coagulation regimen.    RECENT RESULTS: Recent results are below, the most recent result is correlated with a dose of 50 mg. per week: Lab Results  Component Value Date   INR 3.10 02/05/2012   INR 2.80 01/08/2012   INR 2.3 11/13/2011   PROTIME 39.6* 03/24/2010    ANTI-COAG DOSE:   Latest dosing instructions   Total Sun Mon Tue Wed Thu Fri Sat   47.5 7.5 mg 5 mg 7.5 mg 7.5 mg 5 mg 7.5 mg 7.5 mg    (5 mg1.5) (5 mg1) (5 mg1.5) (5 mg1.5) (5 mg1) (5 mg1.5) (5 mg1.5)         ANTICOAG SUMMARY: Anticoagulation Episode Summary              Current INR goal 2.0-3.0 Next INR check 03/04/2012   INR from last check 3.10! (02/05/2012)     Weekly max dose (mg)  Target end date Indefinite   Indications EMBOLISM AND THROMBOSIS, ARTERY, Long term current use of anticoagulant   INR check [facility] Pref[facility]    Send INR reminders to [facility]   Comments        Provider Role Specialty Phone number   Cliffton Asters, MD  Infectious Diseases [phone]        ANTICOAG TODAY: Anticoagulation Summary as of 02/05/2012              INR goal 2.0-3.0     Selected INR 3.10! (02/05/2012) Next INR check 03/04/2012   Weekly max dose (mg)  Target end date Indefinite   Indications EMBOLISM AND THROMBOSIS, ARTERY, Long term current use of anticoagulant    Anticoagulation Episode Summary              INR check location [facility] Pref[facility]    Send INR reminders to [facility]   Comments        Provider Role Specialty Phone number   Cliffton Asters, MD  Infectious Diseases [phone]        PATIENT INSTRUCTIONS: Patient Instructions  Patient instructed to take medications as defined in the Anti-coagulation Track section of this encounter.  Patient instructed to take today's dose.  Patient verbalized understanding of these instructions.        FOLLOW-UP Return in 4 weeks  (on 03/04/2012) for Follow up INR.  [name], [name] Pharm.D., CACP

## 2012-02-05 NOTE — Progress Notes (Signed)
From what I can gather on chart review it appears that 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  I am unclear if this is a result of occlusion from his severe peripheral vascular occlusive disease or an arterial thrombus.  I am also unclear if the plan was for life long anticoagulation or not.  He is not followed in the [facility], therefore I have solicited one of his physicians for this information, and if this data is know will update his chart as to the indications for chronic life long anticoagulation.

## 2012-03-04 ENCOUNTER — Ambulatory Visit (INDEPENDENT_AMBULATORY_CARE_PROVIDER_SITE_OTHER): Payer: Medicare Other | Admitting: Pharmacist

## 2012-03-04 DIAGNOSIS — I749 Embolism and thrombosis of unspecified artery: Secondary | ICD-10-CM

## 2012-03-04 DIAGNOSIS — Z7901 Long term (current) use of anticoagulants: Secondary | ICD-10-CM

## 2012-03-04 NOTE — Progress Notes (Signed)
Patient is followed by Dr. Orvan Falconer in [facility].

## 2012-03-04 NOTE — Progress Notes (Signed)
Anti-Coagulation Progress Note  [name] is a 51 y.o. male who is currently on an anti-coagulation regimen.    RECENT RESULTS: Recent results are below, the most recent result is correlated with a dose of 45 mg. per week: Lab Results  Component Value Date   INR 4.10 03/04/2012   INR 3.10 02/05/2012   INR 2.80 01/08/2012   PROTIME 39.6* 03/24/2010    ANTI-COAG DOSE:   Latest dosing instructions   Total Sun Mon Tue Wed Thu Fri Sat   42.5 5 mg 7.5 mg 5 mg 7.5 mg 5 mg 7.5 mg 5 mg    (5 mg1) (5 mg1.5) (5 mg1) (5 mg1.5) (5 mg1) (5 mg1.5) (5 mg1)         ANTICOAG SUMMARY: Anticoagulation Episode Summary              Current INR goal 2.0-3.0 Next INR check 04/01/2012   INR from last check 4.10! (03/04/2012)     Weekly max dose (mg)  Target end date Indefinite   Indications EMBOLISM AND THROMBOSIS, ARTERY, Long term current use of anticoagulant   IN[facility] location [facility] Pref[facility]    Send INR reminders to [facility]   Comments        Provider Role Specialty Phone number   Cliffton Asters, MD  Infectious Diseases [phone]        ANTICOAG TODAY: Anticoagulation Summary as of 03/04/2012              INR goal 2.0-3.0     Selected INR 4.10! (03/04/2012) Next INR check 04/01/2012   Weekly max dose (mg)  Target end date Indefinite   Indications EMBOLISM AND THROMBOSIS, ARTERY, Long term current use of anticoagulant    Anticoagulation Episode Summary              INR check location [facility] Preferred lab    Send INR reminders to [facility]   Comments        Provider Role Specialty Phone number   Cliffton Asters, MD  Infectious Diseases [phone]        PATIENT INSTRUCTIONS: Patient Instructions  Patient instructed to take medications as defined in the Anti-coagulation Track section of this encounter.  Patient instructed to OMIT today's dose.  Patient verbalized understanding of these instructions.        FOLLOW-UP Return in 4 weeks (on  04/01/2012) for Follow up INR.  [name], [name] Pharm.D., CACP

## 2012-03-04 NOTE — Patient Instructions (Signed)
Patient instructed to take medications as defined in the Anti-coagulation Track section of this encounter.  Patient instructed to OMIT today's dose.  Patient verbalized understanding of these instructions.    

## 2012-03-06 ENCOUNTER — Other Ambulatory Visit: Payer: Self-pay | Admitting: *Deleted

## 2012-03-06 DIAGNOSIS — G8929 Other chronic pain: Secondary | ICD-10-CM

## 2012-03-06 MED ORDER — MORPHINE SULFATE ER 30 MG PO TBCR: 30.0000 mg | EXTENDED_RELEASE_TABLET | Freq: Two times a day (BID) | ORAL | Status: DC

## 2012-03-06 MED ORDER — MORPHINE SULFATE ER 60 MG PO TBCR: 60.0000 mg | EXTENDED_RELEASE_TABLET | Freq: Two times a day (BID) | ORAL | Status: DC

## 2012-03-06 MED ORDER — OXYCODONE HCL 5 MG PO TABS: 5.0000 mg | ORAL_TABLET | Freq: Two times a day (BID) | ORAL | Status: DC

## 2012-03-06 NOTE — Telephone Encounter (Signed)
Patient called and requested we have his Rx ready when he comes in for his labs tomorrow 03/06/12. Advised him that I will print them and have the provider sign and he can pick it up tomorrow.

## 2012-03-07 ENCOUNTER — Other Ambulatory Visit: Payer: Medicare Other

## 2012-03-07 ENCOUNTER — Other Ambulatory Visit: Payer: Self-pay | Admitting: Internal Medicine

## 2012-03-07 ENCOUNTER — Telehealth: Payer: Self-pay | Admitting: *Deleted

## 2012-03-07 ENCOUNTER — Encounter: Payer: Self-pay | Admitting: *Deleted

## 2012-03-07 DIAGNOSIS — I749 Embolism and thrombosis of unspecified artery: Secondary | ICD-10-CM

## 2012-03-07 DIAGNOSIS — B20 Human immunodeficiency virus [HIV] disease: Secondary | ICD-10-CM

## 2012-03-07 DIAGNOSIS — Z113 Encounter for screening for infections with a predominantly sexual mode of transmission: Secondary | ICD-10-CM

## 2012-03-07 DIAGNOSIS — Z79899 Other long term (current) drug therapy: Secondary | ICD-10-CM

## 2012-03-07 LAB — COMPREHENSIVE METABOLIC PANEL
Alkaline Phosphatase: 91 U/L (ref 39–117)
BUN: 7 mg/dL (ref 6–23)
CO2: 26 mEq/L (ref 19–32)
Creat: 0.88 mg/dL (ref 0.50–1.35)
Glucose, Bld: 160 mg/dL — ABNORMAL HIGH (ref 70–99)
Total Bilirubin: 1.8 mg/dL — ABNORMAL HIGH (ref 0.3–1.2)
Total Protein: 7.9 g/dL (ref 6.0–8.3)

## 2012-03-07 LAB — CBC
HCT: 42.5 % (ref 39.0–52.0)
Hemoglobin: 14.5 g/dL (ref 13.0–17.0)
MCH: 36.4 pg — ABNORMAL HIGH (ref 26.0–34.0)
MCHC: 34.1 g/dL (ref 30.0–36.0)
MCV: 106.8 fL — ABNORMAL HIGH (ref 78.0–100.0)
RBC: 3.98 MIL/uL — ABNORMAL LOW (ref 4.22–5.81)

## 2012-03-07 LAB — LIPID PANEL
Cholesterol: 258 mg/dL — ABNORMAL HIGH (ref 0–200)
HDL: 32 mg/dL — ABNORMAL LOW (ref >39)
Triglycerides: 657 mg/dL — ABNORMAL HIGH (ref <150)

## 2012-03-07 NOTE — Telephone Encounter (Signed)
I told him Angelique Blonder had called. He is being referred to another md for his coumadin & will be evaluated

## 2012-03-07 NOTE — Telephone Encounter (Signed)
Unable to speak with pt about Dr. Orvan Falconer is referring pt to Dr. Pierce Crane to evaluate need for continued Coumadin Therapy.  RN will mail letter to pt with this information.

## 2012-03-08 LAB — HIV-1 RNA QUANT-NO REFLEX-BLD
HIV 1 RNA Quant: <20 {copies}/mL
HIV-1 RNA Quant, Log: <1.3 {Log}

## 2012-03-08 LAB — T-HELPER CELL (CD4) - (RCID CLINIC ONLY): CD4 T Cell Abs: 1070 uL (ref 400–2700)

## 2012-03-08 LAB — RPR

## 2012-03-15 ENCOUNTER — Telehealth: Payer: Self-pay | Admitting: *Deleted

## 2012-03-15 NOTE — Telephone Encounter (Signed)
Called [phone] and was sent to [facility], scheduler for Dr. Pierce Crane, Hematologist. Left a message stating pt name, DOB, and the need for an appt for his coumadin therapy to be evaluated. (Pt's coumadin was initiated by Dr. Pierce Crane). Waiting to hear back from [place]. Tacey Heap RN

## 2012-03-21 ENCOUNTER — Ambulatory Visit (INDEPENDENT_AMBULATORY_CARE_PROVIDER_SITE_OTHER): Payer: Medicare Other | Admitting: Internal Medicine

## 2012-03-21 ENCOUNTER — Encounter: Payer: Self-pay | Admitting: Internal Medicine

## 2012-03-21 VITALS — BP 131/76 | HR 88 | Temp 99.0°F | Ht 72.0 in | Wt 150.5 lb

## 2012-03-21 DIAGNOSIS — Z0000 Encounter for general adult medical examination without abnormal findings: Secondary | ICD-10-CM

## 2012-03-21 DIAGNOSIS — G547 Phantom limb syndrome without pain: Secondary | ICD-10-CM | POA: Diagnosis not present

## 2012-03-21 DIAGNOSIS — Z79899 Other long term (current) drug therapy: Secondary | ICD-10-CM | POA: Diagnosis not present

## 2012-03-21 DIAGNOSIS — E785 Hyperlipidemia, unspecified: Secondary | ICD-10-CM

## 2012-03-21 DIAGNOSIS — B20 Human immunodeficiency virus [HIV] disease: Secondary | ICD-10-CM | POA: Diagnosis not present

## 2012-03-21 DIAGNOSIS — G546 Phantom limb syndrome with pain: Secondary | ICD-10-CM | POA: Insufficient documentation

## 2012-03-21 MED ORDER — FENOFIBRATE 145 MG PO TABS: 145.0000 mg | ORAL_TABLET | Freq: Every day | ORAL | Status: DC

## 2012-03-21 NOTE — Progress Notes (Signed)
Addended by: Jennet Maduro D on: 03/21/2012 09:53 AM   Modules accepted: Orders

## 2012-03-21 NOTE — Progress Notes (Signed)
Addended by: Jennet Maduro D on: 03/21/2012 09:57 AM   Modules accepted: Orders

## 2012-03-21 NOTE — Progress Notes (Signed)
Patient ID: [name], male   DOB: Oct 07, 1961, 51 y.o.   MRN: [id number]     [name] for Infectious Disease  Patient Active Problem List  Diagnoses  . METHICILLIN RESISTANT STAPHYLOCOCCUS AUREUS INFECTION  . HIV DISEASE  . SHINGLES, RECURRENT  . HEPATITIS B  . HYPOGONADISM  . DEFICIENCY OF OTHER VITAMINS  . DYSLIPIDEMIA  . LEUKOCYTOSIS UNSPECIFIED  . PSYCHOSIS  . ALCOHOL ABUSE  . CIGARETTE SMOKER  . PHANTOM LIMB SYNDROME  . PERIPHERAL NEUROPATHY  . EMBOLISM AND THROMBOSIS, ARTERY  . ALLERGIC RHINITIS  . PULMONARY NODULE  . DENTAL CARIES  . GERD  . ERECTILE DYSFUNCTION, ORGANIC  . ENLARGEMENT OF LYMPH NODES  . HICCUPS  . APPENDECTOMY, HX OF  . AKA, RIGHT, HX OF  . Long term current use of anticoagulant    Patient's Medications  New Prescriptions   FENOFIBRATE (TRICOR) 145 MG TABLET    Take 1 tablet (145 mg total) by mouth daily.  Previous Medications   ASPIRIN 81 MG TABLET    Take 81 mg by mouth daily.     ENSURE (ENSURE)    Take 1 Can by mouth 3 (three) times daily between meals.   GABAPENTIN (NEURONTIN) 400 MG CAPSULE    TAKE 1 CAPSULE BY MOUTH FOUR TIMES DAILY   MORPHINE (MS CONTIN) 30 MG 12 HR TABLET    Take 1 tablet (30 mg total) by mouth 2 (two) times daily.   MORPHINE (MS CONTIN) 60 MG 12 HR TABLET    Take 1 tablet (60 mg total) by mouth 2 (two) times daily.   OXYCODONE (OXY IR/ROXICODONE) 5 MG IMMEDIATE RELEASE TABLET    Take 1 tablet (5 mg total) by mouth 2 (two) times daily.   REYATAZ 300 MG CAPSULE    TAKE 1 CAPSULE BY MOUTH ONCE A DAY   RITONAVIR (NORVIR) 100 MG TABS    Take 1 tablet (100 mg total) by mouth daily.   STAVUDINE (ZERIT) 40 MG CAPSULE    1 CAPSULE BY MOUTH TWICE A DAY   TENOFOVIR ([name]) 300 MG TABLET    Take 1 tablet (300 mg total) by mouth daily.   WARFARIN (COUMADIN) 5 MG TABLET    Take as directed by anticoagulation clinic provider.  Modified Medications   No medications on file  Discontinued Medications   FISH OIL-OMEGA-3 FATTY  ACIDS 1000 MG CAPSULE    Take 2 capsules (2 g total) by mouth daily.   MORPHINE (MS CONTIN) 30 MG 12 HR TABLET    Take 1 tablet (30 mg total) by mouth every 12 (twelve) hours.   MORPHINE (MS CONTIN) 60 MG 12 HR TABLET    Take 1 tablet (60 mg total) by mouth 2 (two) times daily.    Subjective: [name] is in for his routine visit. As usual, he has not missed a single dose of his antiretroviral medications. [place] has moved to [name] and has finally been able to settle his finances after the death of his wife and his own the lengthy hospitalization several years ago. 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 He has not changed any doses of his narcotic pain medication. He continues to smoke cigarettes and does not currently feel like he is able to quit. He is happy that his daughter is doing better in school and now back to getting A's and B's. He continues to take Coumadin and is followed in the internal medicine teaching service  clinic. We are currently trying to schedule a followup visit with his hematologist, Dr. Pierce [name], to reevaluate the need for lifelong anticoagulation.  Objective: Temp: 99 F (37.2 C) (05/23 0900) Temp src: Oral (05/23 0900) BP: 131/76 mmHg (05/23 0900) Pulse Rate: 88  (05/23 0900)  General: He is in good spirits Skin: No rash Lungs: Clear Cor: Regular S1 and S2 no murmurs  Lab Results HIV 1 RNA Quant (copies/mL)  Date Value  03/07/2012 <20   09/05/2011 <20   02/13/2011 119*     CD4 T Cell Abs (cmm)  Date Value  03/07/2012 1070   09/05/2011 1240   02/13/2011 1710      Assessment: His HIV infection remains under excellent control. I will continue his current regimen.  His triglycerides remain over 600. He could not afford the fish oil supplements and stopped taking them several months ago. I will start fenofibrate.  I have encouraged him to consider quitting cigarettes.  We will try to facilitate the followup visit  with Dr. Donnie [name]  We will also try to help him get a primary care physician closer to his home at [facility].  Plan: 1. Continue current antiretroviral regimen 2. Encouraged quit smoking 3. Start fenofibrate therapy 4. Hematology evaluation for reevaluation of need for lifelong anticoagulation   Matthew Asters, MD [name] [phone] pager   [phone] cell 03/21/2012, 9:30 AM

## 2012-04-01 ENCOUNTER — Other Ambulatory Visit (HOSPITAL_BASED_OUTPATIENT_CLINIC_OR_DEPARTMENT_OTHER): Payer: Medicare Other | Admitting: Lab

## 2012-04-01 ENCOUNTER — Ambulatory Visit (INDEPENDENT_AMBULATORY_CARE_PROVIDER_SITE_OTHER): Payer: Medicare Other | Admitting: Pharmacist

## 2012-04-01 DIAGNOSIS — Z7901 Long term (current) use of anticoagulants: Secondary | ICD-10-CM | POA: Diagnosis not present

## 2012-04-01 DIAGNOSIS — I749 Embolism and thrombosis of unspecified artery: Secondary | ICD-10-CM | POA: Diagnosis not present

## 2012-04-01 DIAGNOSIS — Z5181 Encounter for therapeutic drug level monitoring: Secondary | ICD-10-CM | POA: Diagnosis not present

## 2012-04-01 LAB — CBC WITH DIFFERENTIAL/PLATELET
BASO%: 0.5 % (ref 0.0–2.0)
EOS%: 1 % (ref 0.0–7.0)
LYMPH%: 60.6 % — ABNORMAL HIGH (ref 14.0–49.0)
MCH: 37.8 pg — ABNORMAL HIGH (ref 27.2–33.4)
MCHC: 34.6 g/dL (ref 32.0–36.0)
MONO#: 0.5 10*3/uL (ref 0.1–0.9)
Platelets: 308 10*3/uL (ref 140–400)
RBC: 4.02 10*6/uL — ABNORMAL LOW (ref 4.20–5.82)
WBC: 8.5 10*3/uL (ref 4.0–10.3)
nRBC: 0 % (ref 0–0)

## 2012-04-01 LAB — PROTIME-INR

## 2012-04-01 NOTE — Progress Notes (Signed)
Anti-Coagulation Progress Note  [name] is a 51 y.o. male who is currently on an anti-coagulation regimen.    RECENT RESULTS: Recent results are below, the most recent result is correlated with a dose of 42.5 mg. per week: Lab Results  Component Value Date   INR 3.2 04/01/2012   INR 3.20 04/01/2012   INR 4.10 03/04/2012   PROTIME 38.4* 04/01/2012    ANTI-COAG DOSE:   Latest dosing instructions   Total Sun Mon Tue Wed Thu Fri Sat   37.5 5 mg 5 mg 5 mg 7.5 mg 5 mg 5 mg 5 mg    (5 mg1) (5 mg1) (5 mg1) (5 mg1.5) (5 mg1) (5 mg1) (5 mg1)         ANTICOAG SUMMARY: Anticoagulation Episode Summary              Current INR goal 2.0-3.0 Next INR check 04/22/2012   INR from last check 3.2! (04/01/2012)     Weekly max dose (mg)  Target end date Indefinite   Indications EMBOLISM AND THROMBOSIS, ARTERY, Long term current use of anticoagulant   [facility]    Send INR reminders to [facility]   Comments        Provider Role Specialty Phone number   Cliffton Asters, MD  Infectious Diseases [phone]        ANTICOAG TODAY: Anticoagulation Summary as of 04/01/2012              INR goal 2.0-3.0     Selected INR 3.2! (04/01/2012) Next INR check 04/22/2012   Weekly max dose (mg)  Target end date Indefinite   Indications EMBOLISM AND THROMBOSIS, ARTERY, Long term current use of anticoagulant    Anticoagulation Episode Summary              INR check location [facility] Preferred lab    Send INR reminders to [facility]   Comments        Provider Role Specialty Phone number   Cliffton Asters, MD  Infectious Diseases [phone]        PATIENT INSTRUCTIONS: Patient Instructions  Patient instructed to take medications as defined in the Anti-coagulation Track section of this encounter.  Patient instructed to take today's dose.  Patient verbalized understanding of these instructions.      FOLLOW-UP Return in 3 weeks (on 04/22/2012) for  Follow up INR at 2:30PM.  [name], [name] Pharm.D., CACP

## 2012-04-01 NOTE — Patient Instructions (Signed)
Patient instructed to take medications as defined in the Anti-coagulation Track section of this encounter.  Patient instructed to take today's dose.  Patient verbalized understanding of these instructions.    

## 2012-04-03 LAB — HYPERCOAGULABLE PANEL, COMPREHENSIVE
AntiThromb III Func: 113 % (ref 76–126)
Anticardiolipin IgA: 0 APL U/mL (ref <22)
Anticardiolipin IgM: 2 MPL U/mL (ref <11)
Beta-2 Glyco I IgG: 4 G Units (ref <20)
Beta-2-Glycoprotein I IgA: 4 A Units (ref <20)
Beta-2-Glycoprotein I IgM: 7 M Units (ref <20)
PTT Lupus Anticoagulant: 69 secs — ABNORMAL HIGH (ref 28.0–43.0)
PTTLA 4:1 Mix: 47.1 secs — ABNORMAL HIGH (ref 28.0–43.0)
Protein C, Total: 45 % — ABNORMAL LOW (ref 72–160)
Protein S Activity: 26 % — ABNORMAL LOW (ref 69–129)
Protein S Total: 46 % — ABNORMAL LOW (ref 60–150)

## 2012-04-03 LAB — COMPREHENSIVE METABOLIC PANEL
ALT: 36 U/L (ref 0–53)
AST: 39 U/L — ABNORMAL HIGH (ref 0–37)
Alkaline Phosphatase: 79 U/L (ref 39–117)
BUN: 6 mg/dL (ref 6–23)
Calcium: 9.9 mg/dL (ref 8.4–10.5)
Chloride: 99 mEq/L (ref 96–112)
Creatinine, Ser: 1.08 mg/dL (ref 0.50–1.35)
Total Bilirubin: 2.7 mg/dL — ABNORMAL HIGH (ref 0.3–1.2)

## 2012-04-04 ENCOUNTER — Other Ambulatory Visit: Payer: Self-pay | Admitting: *Deleted

## 2012-04-04 ENCOUNTER — Ambulatory Visit (HOSPITAL_BASED_OUTPATIENT_CLINIC_OR_DEPARTMENT_OTHER): Payer: Medicare Other | Admitting: Oncology

## 2012-04-04 VITALS — BP 172/74 | HR 94 | Temp 99.1°F | Ht 72.0 in | Wt 149.6 lb

## 2012-04-04 DIAGNOSIS — G8929 Other chronic pain: Secondary | ICD-10-CM

## 2012-04-04 DIAGNOSIS — I749 Embolism and thrombosis of unspecified artery: Secondary | ICD-10-CM

## 2012-04-04 DIAGNOSIS — B20 Human immunodeficiency virus [HIV] disease: Secondary | ICD-10-CM | POA: Diagnosis not present

## 2012-04-04 DIAGNOSIS — F17200 Nicotine dependence, unspecified, uncomplicated: Secondary | ICD-10-CM | POA: Diagnosis not present

## 2012-04-04 MED ORDER — MORPHINE SULFATE ER 60 MG PO TBCR: 60.0000 mg | EXTENDED_RELEASE_TABLET | Freq: Two times a day (BID) | ORAL | Status: DC

## 2012-04-04 MED ORDER — OXYCODONE HCL 5 MG PO TABS: 5.0000 mg | ORAL_TABLET | Freq: Two times a day (BID) | ORAL | Status: DC

## 2012-04-04 MED ORDER — MORPHINE SULFATE ER 30 MG PO TBCR: 30.0000 mg | EXTENDED_RELEASE_TABLET | Freq: Two times a day (BID) | ORAL | Status: DC

## 2012-04-04 NOTE — Telephone Encounter (Signed)
Pt given list of PCP in his area by Angelique Blonder RN at last appt with Dr. Orvan Falconer. Pt wanted to schedule PCP himself. Tacey Heap RN

## 2012-04-04 NOTE — Telephone Encounter (Signed)
He wanted to get his scripts today. Due Sunday. His md will not be here until next Monday. The md here will not sign these scripts. Asked pt to call 5 days in advance in the future to avoid this problem. Told him out mds are not here 5 days a week. His md will be here Monday. He states he will be here by 10am. I asked that he call first to make sure they are signed.

## 2012-04-07 NOTE — Progress Notes (Signed)
Referral MD  Reason for Referral: History of hypercoaguable state on chroinc coumadin.   Chief Complaint  Patient presents with  . Follow-up  : of arterial thrombis  HPI:  This is a f/u of a pt that I initially saw in 2010, when he presented with clot in the SFA and ultimately required a BKA. At the time he was evaluated for some long opacities as well. His HIV status is stable and his most recent viral loads etc have been excellent. He has been compliant with all his medications and his coumadin dosing has been relatively stable as well. He has not had any excessive bleeding. He is using his prosthesis without difficulty. He is also on ASA. He continues to smoke.  :  Current outpatient prescriptions:aspirin 81 MG tablet, Take 81 mg by mouth daily.  , Disp: , Rfl: ;  ENSURE (ENSURE), Take 1 Can by mouth 3 (three) times daily between meals., Disp: 21330 mL, Rfl: prn;  fenofibrate (TRICOR) 145 MG tablet, Take 1 tablet (145 mg total) by mouth daily., Disp: 30 tablet, Rfl: 11;  gabapentin (NEURONTIN) 400 MG capsule, TAKE 1 CAPSULE BY MOUTH FOUR TIMES DAILY, Disp: 120 capsule, Rfl: 3 morphine (MS CONTIN) 30 MG 12 hr tablet, Take 1 tablet (30 mg total) by mouth 2 (two) times daily., Disp: 60 tablet, Rfl: 0;  morphine (MS CONTIN) 60 MG 12 hr tablet, Take 1 tablet (60 mg total) by mouth 2 (two) times daily., Disp: 60 tablet, Rfl: 0;  oxyCODONE (OXY IR/ROXICODONE) 5 MG immediate release tablet, Take 1 tablet (5 mg total) by mouth 2 (two) times daily., Disp: 60 tablet, Rfl: 0 REYATAZ 300 MG capsule, TAKE 1 CAPSULE BY MOUTH ONCE A DAY, Disp: 30 capsule, Rfl: 11;  ritonavir (NORVIR) 100 MG TABS, Take 1 tablet (100 mg total) by mouth daily., Disp: 30 tablet, Rfl: 5;  stavudine (ZERIT) 40 MG capsule, 1 CAPSULE BY MOUTH TWICE A DAY, Disp: 60 capsule, Rfl: 11;  tenofovir ([name]) 300 MG tablet, Take 1 tablet (300 mg total) by mouth daily., Disp: 30 tablet, Rfl: 11 warfarin (COUMADIN) 5 MG tablet, Take as directed  by anticoagulation clinic provider., Disp: 90 tablet, Rfl: 2:    :  Allergies  Allergen Reactions  . Dapsone     REACTION: fever  . Sulfamethoxazole W-Trimethoprim     REACTION: fever  :  No family history on file.:  History   Social History  . Marital Status: Married    Spouse Name: N/A    Number of Children: N/A  . Years of Education: N/A   Occupational History  . Not on file.   Social History Main Topics  . Smoking status: Current Everyday Smoker -- 1.0 packs/day    Types: Cigarettes  . Smokeless tobacco: Never Used  . Alcohol Use: No  . Drug Use: No  . Sexually Active: Not Currently     declined condoms   Other Topics Concern  . Not on file   Social History Narrative  . No narrative on file  :    ROS Denies h/a, sob , chest pain, does have some peripheral numbness   Exam: General appearance: alert, cooperative and appears stated age Eyes: conjunctivae/corneas clear. PERRL, EOM's intact. Fundi benign. Throat: lips, mucosa, and tongue normal; teeth and gums normal Resp: clear to auscultation bilaterally and normal percussion bilaterally Cardio: regular rate and rhythm, S1, S2 normal, no murmur, click, rub or gallop and normal apical impulse GI: soft, non-tender; bowel sounds normal; no  masses,  no organomegaly Extremities: s/p rt bka Pulses: 2+ and symmetric decreased femoral pulses Lymph nodes: Cervical, supraclavicular, and axillary nodes normal. Neurologic: Grossly normal  No results found for this basename: WBC:2,HGB:2,HCT:2,PLT:2 in the last 72 hours No results found for this basename: NA:2,K:2,CL:2,CO2:2,GLUCOSE:2,BUN:2,CREATININE:2,CALCIUM:2 in the last 72 hours  Blood smear review: n/a  Pathology:n/a  No results found.  Assessment and Plan: 51 yo man with history of HIV, and rt arterial thrombus s/p rt bka. His hypercoaguable labs do not point to a hypercoaguable state. One option would be to add plavix or another platelet inhibitor to  his regimen, in addition to the ASA, if coumadin is discontinued. He is comfortable with the coumadin and has not had any events since 2010. He continues to smoke and so his risk for further clotting is somewhat higher. As such if there ios no other contraindication, I would leave him on coumadin.  Pierce Crane MD

## 2012-04-09 ENCOUNTER — Ambulatory Visit (INDEPENDENT_AMBULATORY_CARE_PROVIDER_SITE_OTHER): Payer: Medicare Other | Admitting: Internal Medicine

## 2012-04-09 ENCOUNTER — Telehealth: Payer: Self-pay | Admitting: Internal Medicine

## 2012-04-09 ENCOUNTER — Encounter: Payer: Self-pay | Admitting: Internal Medicine

## 2012-04-09 VITALS — BP 128/80 | HR 82 | Temp 98.3°F | Resp 16

## 2012-04-09 DIAGNOSIS — F17200 Nicotine dependence, unspecified, uncomplicated: Secondary | ICD-10-CM

## 2012-04-09 DIAGNOSIS — G546 Phantom limb syndrome with pain: Secondary | ICD-10-CM

## 2012-04-09 DIAGNOSIS — G547 Phantom limb syndrome without pain: Secondary | ICD-10-CM | POA: Diagnosis not present

## 2012-04-09 DIAGNOSIS — E785 Hyperlipidemia, unspecified: Secondary | ICD-10-CM | POA: Diagnosis not present

## 2012-04-09 DIAGNOSIS — I749 Embolism and thrombosis of unspecified artery: Secondary | ICD-10-CM

## 2012-04-09 NOTE — Assessment & Plan Note (Signed)
Not currently interested in cessation.

## 2012-04-09 NOTE — Patient Instructions (Addendum)
Please schedule fasting labwork in approximately 6 weeks (272.4)

## 2012-04-09 NOTE — Assessment & Plan Note (Signed)
Maintained on coumadin chronically. States he will continue with [facility].

## 2012-04-09 NOTE — Assessment & Plan Note (Signed)
Long discussion held. Maintained on long acting and IR narcotics long term. Informed pt do not prescribe long term narcotics but would be happy to refer to Pain Clinic. States he will contact ID who has prescribed long term to see if willing to continue. Instructed to call clinic for referral if needed.

## 2012-04-09 NOTE — Progress Notes (Signed)
  Subjective:    Patient ID: [name], male    DOB: Mar 20, 1961, 51 y.o.   MRN: [id number]  HPI Pt presents to clinic for followup of multiple medical problems. States was told he needed a PMD and wishes to have his chronic narcotic pain medication prescribed. History reviewed and includes HIV followed by ID, s/p right AKA with phantom pain and h/o arterial thrombus maintained on coumadin followed by coumadin clinic. Takes ms contin 90mg  bid and oxycodone 5mg  tid prescribed currently by ID. States previously followed by pain clinic in [place] however was upset his IR dosing was decreased to bid. H/o hyperlipidemia with elevated TG-states was recently placed on fenofibrate. Evaluated by hematology with recommendation for indefinite coumadin anticoagulation. Declines colonoscopy.  Past Medical History  Diagnosis Date  . History of chicken pox   . Hyperlipidemia    Past Surgical History  Procedure Date  . Appendectomy 1962  . Leg amputation above knee 2010    right leg    reports that he has been smoking Cigarettes.  He has been smoking about 1 pack per day. He has never used smokeless tobacco. He reports that he does not drink alcohol or use illicit drugs. family history includes Arthritis in his mother. Allergies  Allergen Reactions  . Dapsone     REACTION: fever  . Sulfamethoxazole W-Trimethoprim     REACTION: fever      Review of Systems  Musculoskeletal: Positive for arthralgias.  Neurological: Negative for weakness.  Hematological: Does not bruise/bleed easily.  All other systems reviewed and are negative.       Objective:   Physical Exam  Constitutional: He appears well-developed and well-nourished. No distress.       Noted in wheelchair. Self propels.  HENT:  Head: Normocephalic and atraumatic.  Right Ear: External ear normal.  Left Ear: External ear normal.  Eyes: Conjunctivae are normal. No scleral icterus.  Neck: Neck supple. Carotid bruit is not present.    Cardiovascular: Normal rate, regular rhythm and normal heart sounds.  Exam reveals no gallop and no friction rub.   No murmur heard. Pulmonary/Chest: Effort normal and breath sounds normal. No respiratory distress. He has no wheezes. He has no rales.  Neurological: He is alert.  Skin: Skin is warm and dry. He is not diaphoretic.  Psychiatric: He has a normal mood and affect.          Assessment & Plan:

## 2012-04-09 NOTE — Assessment & Plan Note (Signed)
Schedule fasting lipid profile and lft ~6 weeks.

## 2012-04-09 NOTE — Telephone Encounter (Signed)
Lab order entered for November 2013. 

## 2012-04-22 ENCOUNTER — Ambulatory Visit (INDEPENDENT_AMBULATORY_CARE_PROVIDER_SITE_OTHER): Payer: Medicare Other | Admitting: Pharmacist

## 2012-04-22 DIAGNOSIS — I749 Embolism and thrombosis of unspecified artery: Secondary | ICD-10-CM | POA: Diagnosis not present

## 2012-04-22 DIAGNOSIS — Z7901 Long term (current) use of anticoagulants: Secondary | ICD-10-CM

## 2012-04-22 NOTE — Patient Instructions (Signed)
Patient instructed to take medications as defined in the Anti-coagulation Track section of this encounter.  Patient instructed to take today's dose.  Patient verbalized understanding of these instructions.    

## 2012-04-22 NOTE — Progress Notes (Signed)
Anti-Coagulation Progress Note  [name] is a 51 y.o. male who is currently on an anti-coagulation regimen.    RECENT RESULTS: Recent results are below, the most recent result is correlated with a dose of 37.5 mg. per week: Lab Results  Component Value Date   INR 3.1 04/22/2012   INR 3.2 04/01/2012   INR 3.20 04/01/2012   PROTIME 38.4* 04/01/2012    ANTI-COAG DOSE:   Latest dosing instructions   Total Sun Mon Tue Wed Thu Fri Sat   35 5 mg 5 mg 5 mg 5 mg 5 mg 5 mg 5 mg    (5 mg1) (5 mg1) (5 mg1) (5 mg1) (5 mg1) (5 mg1) (5 mg1)         ANTICOAG SUMMARY: Anticoagulation Episode Summary              Current INR goal 2.0-3.0 Next INR check 05/20/2012   INR from last check 3.1! (04/22/2012)     Weekly max dose (mg)  Target end date Indefinite   Indications EMBOLISM AND THROMBOSIS, ARTERY, Long term current use of anticoagulant   [facility] check [facility]    Send INR reminders to [facility]   Comments        Provider Role Specialty Phone number   Cliffton Asters, MD  Infectious Diseases [phone]        ANTICOAG TODAY: Anticoagulation Summary as of 04/22/2012              INR goal 2.0-3.0     Selected INR 3.1! (04/22/2012) Next INR check 05/20/2012   Weekly max dose (mg)  Target end date Indefinite   Indications EMBOLISM AND THROMBOSIS, ARTERY, Long term current use of anticoagulant    Anticoagulation Episode Summary              INR check location [facility] Preferred lab    Send INR reminders to [facility]   Comments        Provider Role Specialty Phone number   Cliffton Asters, MD  Infectious Diseases [phone]        PATIENT INSTRUCTIONS: Patient Instructions  Patient instructed to take medications as defined in the Anti-coagulation Track section of this encounter.  Patient instructed to take today's dose.  Patient verbalized understanding of these instructions.        FOLLOW-UP Return in 4 weeks (on 05/20/2012) for  Follow up INR at 2:00PM.  [name], [name] Pharm.D., CACP

## 2012-04-30 ENCOUNTER — Other Ambulatory Visit: Payer: Self-pay | Admitting: *Deleted

## 2012-04-30 DIAGNOSIS — G629 Polyneuropathy, unspecified: Secondary | ICD-10-CM

## 2012-04-30 MED ORDER — GABAPENTIN 400 MG PO CAPS: 400.0000 mg | ORAL_CAPSULE | Freq: Three times a day (TID) | ORAL | Status: DC

## 2012-05-06 ENCOUNTER — Other Ambulatory Visit: Payer: Self-pay | Admitting: Licensed Clinical Social Worker

## 2012-05-06 DIAGNOSIS — G8929 Other chronic pain: Secondary | ICD-10-CM

## 2012-05-06 MED ORDER — MORPHINE SULFATE ER 30 MG PO TBCR: 30.0000 mg | EXTENDED_RELEASE_TABLET | Freq: Two times a day (BID) | ORAL | Status: DC

## 2012-05-06 MED ORDER — MORPHINE SULFATE ER 60 MG PO TBCR: 60.0000 mg | EXTENDED_RELEASE_TABLET | Freq: Two times a day (BID) | ORAL | Status: DC

## 2012-05-06 MED ORDER — OXYCODONE HCL 5 MG PO TABS: 5.0000 mg | ORAL_TABLET | Freq: Two times a day (BID) | ORAL | Status: DC

## 2012-05-20 ENCOUNTER — Ambulatory Visit (INDEPENDENT_AMBULATORY_CARE_PROVIDER_SITE_OTHER): Payer: Medicare Other | Admitting: Pharmacist

## 2012-05-20 DIAGNOSIS — I749 Embolism and thrombosis of unspecified artery: Secondary | ICD-10-CM | POA: Diagnosis not present

## 2012-05-20 DIAGNOSIS — Z7901 Long term (current) use of anticoagulants: Secondary | ICD-10-CM

## 2012-05-20 LAB — POCT INR: INR: 2

## 2012-05-20 NOTE — Progress Notes (Signed)
Anti-Coagulation Progress Note  [name] is a 51 y.o. male who is currently on an anti-coagulation regimen.    RECENT RESULTS: Recent results are below, the most recent result is correlated with a dose of 35 mg. per week: Lab Results  Component Value Date   INR 2.00 05/20/2012   INR 3.1 04/22/2012   INR 3.2 04/01/2012   PROTIME 38.4* 04/01/2012    ANTI-COAG DOSE:   Latest dosing instructions   Total Sun Mon Tue Wed Thu Fri Sat   40 5 mg 7.5 mg 5 mg 5 mg 7.5 mg 5 mg 5 mg    (5 mg1) (5 mg1.5) (5 mg1) (5 mg1) (5 mg1.5) (5 mg1) (5 mg1)         ANTICOAG SUMMARY: Anticoagulation Episode Summary              Current INR goal 2.0-3.0 Next INR check 06/17/2012   INR from last check 2.00 (05/20/2012)     Weekly max dose (mg)  Target end date Indefinite   Indications EMBOLISM AND THROMBOSIS, ARTERY, Long term current use of anticoagulant   INR check [facility] Pref[facility] lab    Send INR reminders to [facility]   Comments        Provider Role Specialty Phone number   Cliffton Asters, MD  Infectious Diseases [phone]        ANTICOAG TODAY: Anticoagulation Summary as of 05/20/2012              INR goal 2.0-3.0     Selected INR 2.00 (05/20/2012) Next INR check 06/17/2012   Weekly max dose (mg)  Target end date Indefinite   Indications EMBOLISM AND THROMBOSIS, ARTERY, Long term current use of anticoagulant    Anticoagulation Episode Summary              INR check location [facility] Pref[facility] lab    Send INR reminders to [facility] IMP   Comments        Provider Role Specialty Phone number   Cliffton Asters, MD  Infectious Diseases [phone]        PATIENT INSTRUCTIONS: Patient Instructions  Patient instructed to take medications as defined in the Anti-coagulation Track section of this encounter.  Patient instructed to take today's dose.  Patient verbalized understanding of these instructions.        FOLLOW-UP Return in 4 weeks (on 06/17/2012)  for Follow up INR at 2:00PM.  [name], [name] Pharm.D., CACP

## 2012-05-20 NOTE — Patient Instructions (Signed)
Patient instructed to take medications as defined in the Anti-coagulation Track section of this encounter.  Patient instructed to take today's dose.  Patient verbalized understanding of these instructions.    

## 2012-05-21 NOTE — Progress Notes (Signed)
Agree with plan 

## 2012-06-05 ENCOUNTER — Other Ambulatory Visit: Payer: Self-pay | Admitting: *Deleted

## 2012-06-05 DIAGNOSIS — G8929 Other chronic pain: Secondary | ICD-10-CM

## 2012-06-05 MED ORDER — OXYCODONE HCL 5 MG PO TABS: 5.0000 mg | ORAL_TABLET | Freq: Two times a day (BID) | ORAL | Status: DC

## 2012-06-05 MED ORDER — MORPHINE SULFATE ER 60 MG PO TBCR: 60.0000 mg | EXTENDED_RELEASE_TABLET | Freq: Two times a day (BID) | ORAL | Status: DC

## 2012-06-05 MED ORDER — MORPHINE SULFATE ER 30 MG PO TBCR: 30.0000 mg | EXTENDED_RELEASE_TABLET | Freq: Two times a day (BID) | ORAL | Status: DC

## 2012-06-05 NOTE — Telephone Encounter (Signed)
Printed rxes and placed in Dr. Blair Dolphin box for signature.

## 2012-06-17 ENCOUNTER — Ambulatory Visit (INDEPENDENT_AMBULATORY_CARE_PROVIDER_SITE_OTHER): Payer: Medicare Other | Admitting: Pharmacist

## 2012-06-17 DIAGNOSIS — I749 Embolism and thrombosis of unspecified artery: Secondary | ICD-10-CM | POA: Diagnosis not present

## 2012-06-17 DIAGNOSIS — Z7901 Long term (current) use of anticoagulants: Secondary | ICD-10-CM

## 2012-06-17 LAB — POCT INR: INR: 2.1

## 2012-06-17 NOTE — Patient Instructions (Signed)
Patient instructed to take medications as defined in the Anti-coagulation Track section of this encounter.  Patient instructed to take today's dose.  Patient verbalized understanding of these instructions.    

## 2012-06-17 NOTE — Progress Notes (Signed)
Anti-Coagulation Progress Note  [name] is a 51 y.o. male who is currently on an anti-coagulation regimen.    RECENT RESULTS: Recent results are below, the most recent result is correlated with a dose of 40 mg. per week: Lab Results  Component Value Date   INR 2.10 06/17/2012   INR 2.00 05/20/2012   INR 3.1 04/22/2012   PROTIME 38.4* 04/01/2012    ANTI-COAG DOSE:   Latest dosing instructions   Total Sun Mon Tue Wed Thu Fri Sat   45 5 mg 7.5 mg 5 mg 7.5 mg 7.5 mg 7.5 mg 5 mg    (5 mg1) (5 mg1.5) (5 mg1) (5 mg1.5) (5 mg1.5) (5 mg1.5) (5 mg1)         ANTICOAG SUMMARY: Anticoagulation Episode Summary              Current INR goal 2.0-3.0 Next INR check 07/15/2012   INR from last check 2.10 (06/17/2012)     Weekly max dose (mg)  Target end date Indefinite   Indications EMBOLISM AND THROMBOSIS, ARTERY, Long term current use of anticoagulant   INR check location [facility] Pref[facility]    Send INR reminders to [facility] IMP   Comments        Provider Role Specialty Phone number   Cliffton Asters, MD  Infectious Diseases [phone]        ANTICOAG TODAY: Anticoagulation Summary as of 06/17/2012              INR goal 2.0-3.0     Selected INR 2.10 (06/17/2012) Next INR check 07/15/2012   Weekly max dose (mg)  Target end date Indefinite   Indications EMBOLISM AND THROMBOSIS, ARTERY, Long term current use of anticoagulant    Anticoagulation Episode Summary              INR check location [facility] Preferred lab    Send INR reminders to [facility]   Comments        Provider Role Specialty Phone number   Cliffton Asters, MD  Infectious Diseases [phone]        PATIENT INSTRUCTIONS: Patient Instructions  Patient instructed to take medications as defined in the Anti-coagulation Track section of this encounter.  Patient instructed to take today's dose.  Patient verbalized understanding of these instructions.         FOLLOW-UP Return in 4 weeks  (on 07/15/2012) for Follow up INR at 2:00PM.  [name], [name] Pharm.D., CACP

## 2012-07-02 ENCOUNTER — Other Ambulatory Visit: Payer: Self-pay | Admitting: Internal Medicine

## 2012-07-03 ENCOUNTER — Other Ambulatory Visit: Payer: Self-pay | Admitting: *Deleted

## 2012-07-03 DIAGNOSIS — G8929 Other chronic pain: Secondary | ICD-10-CM

## 2012-07-03 MED ORDER — OXYCODONE HCL 5 MG PO TABS: 5.0000 mg | ORAL_TABLET | Freq: Two times a day (BID) | ORAL | Status: DC

## 2012-07-03 MED ORDER — MORPHINE SULFATE ER 60 MG PO TBCR: 60.0000 mg | EXTENDED_RELEASE_TABLET | Freq: Two times a day (BID) | ORAL | Status: DC

## 2012-07-03 MED ORDER — MORPHINE SULFATE ER 30 MG PO TBCR: 30.0000 mg | EXTENDED_RELEASE_TABLET | Freq: Two times a day (BID) | ORAL | Status: DC

## 2012-07-03 NOTE — Telephone Encounter (Signed)
Patient called and advised he will be here to pick up his Rx's on Friday.

## 2012-07-05 ENCOUNTER — Telehealth: Payer: Self-pay | Admitting: *Deleted

## 2012-07-05 NOTE — Telephone Encounter (Signed)
Patient called to check and see if his Rx were ready for pick up. Advised him yes and that we close for lunch from 1230-130 but can pick up after that time.

## 2012-07-15 ENCOUNTER — Ambulatory Visit (INDEPENDENT_AMBULATORY_CARE_PROVIDER_SITE_OTHER): Payer: Medicare Other | Admitting: Pharmacist

## 2012-07-15 DIAGNOSIS — I749 Embolism and thrombosis of unspecified artery: Secondary | ICD-10-CM | POA: Diagnosis not present

## 2012-07-15 DIAGNOSIS — Z7901 Long term (current) use of anticoagulants: Secondary | ICD-10-CM | POA: Diagnosis not present

## 2012-07-15 LAB — POCT INR: INR: 2.7

## 2012-07-15 NOTE — Progress Notes (Signed)
Anti-Coagulation Progress Note  [name] is a 51 y.o. male who is currently on an anti-coagulation regimen.    RECENT RESULTS: Recent results are below, the most recent result is correlated with a dose of 45 mg. per week: Lab Results  Component Value Date   INR 2.70 07/15/2012   INR 2.70 07/15/2012   INR 2.10 06/17/2012   PROTIME 38.4* 04/01/2012    ANTI-COAG DOSE:   Latest dosing instructions   Total Sun Mon Tue Wed Thu Fri Sat   45 5 mg 7.5 mg 5 mg 7.5 mg 7.5 mg 7.5 mg 5 mg    (5 mg1) (5 mg1.5) (5 mg1) (5 mg1.5) (5 mg1.5) (5 mg1.5) (5 mg1)         ANTICOAG SUMMARY: Anticoagulation Episode Summary              Current INR goal 2.0-3.0 Next INR check 08/12/2012   INR from last check 2.70 (07/15/2012)     Weekly max dose (mg)  Target end date Indefinite   Indications EMBOLISM AND THROMBOSIS, ARTERY, Long term current use of anticoagulant   INR check location [facility] Preferred lab    Send INR reminders to [facility]   Comments        Provider Role Specialty Phone number   Cliffton Asters, MD  Infectious Diseases [phone]        ANTICOAG TODAY: Anticoagulation Summary as of 07/15/2012              INR goal 2.0-3.0     Selected INR 2.70 (07/15/2012) Next INR check 08/12/2012   Weekly max dose (mg)  Target end date Indefinite   Indications EMBOLISM AND THROMBOSIS, ARTERY, Long term current use of anticoagulant    Anticoagulation Episode Summary              INR check location [facility] Preferred lab    Send INR reminders to [facility] IMP   Comments        Provider Role Specialty Phone number   Cliffton Asters, MD  Infectious Diseases [phone]        PATIENT INSTRUCTIONS: Patient Instructions  Patient instructed to take medications as defined in the Anti-coagulation Track section of this encounter.  Patient instructed to take today's dose.  Patient verbalized understanding of these instructions.        FOLLOW-UP Return in 4 weeks  (on 08/12/2012) for Follow up INR at 2PM.  [name], [name] Pharm.D., CACP

## 2012-07-15 NOTE — Patient Instructions (Signed)
Patient instructed to take medications as defined in the Anti-coagulation Track section of this encounter.  Patient instructed to take today's dose.  Patient verbalized understanding of these instructions.    

## 2012-07-16 NOTE — Progress Notes (Signed)
Agree 

## 2012-08-05 ENCOUNTER — Other Ambulatory Visit: Payer: Self-pay

## 2012-08-05 DIAGNOSIS — G8929 Other chronic pain: Secondary | ICD-10-CM

## 2012-08-05 MED ORDER — MORPHINE SULFATE ER 30 MG PO TBCR: 30.0000 mg | EXTENDED_RELEASE_TABLET | Freq: Two times a day (BID) | ORAL | Status: DC

## 2012-08-05 MED ORDER — MORPHINE SULFATE ER 60 MG PO TBCR: 60.0000 mg | EXTENDED_RELEASE_TABLET | Freq: Two times a day (BID) | ORAL | Status: DC

## 2012-08-05 MED ORDER — OXYCODONE HCL 5 MG PO TABS: 5.0000 mg | ORAL_TABLET | Freq: Two times a day (BID) | ORAL | Status: DC

## 2012-08-05 NOTE — Telephone Encounter (Signed)
MS Contin Refill.

## 2012-08-12 ENCOUNTER — Ambulatory Visit (INDEPENDENT_AMBULATORY_CARE_PROVIDER_SITE_OTHER): Payer: Medicare Other | Admitting: Pharmacist

## 2012-08-12 DIAGNOSIS — I749 Embolism and thrombosis of unspecified artery: Secondary | ICD-10-CM

## 2012-08-12 DIAGNOSIS — Z7901 Long term (current) use of anticoagulants: Secondary | ICD-10-CM | POA: Diagnosis not present

## 2012-08-12 LAB — POCT INR: INR: 2.7

## 2012-08-12 MED ORDER — WARFARIN SODIUM 5 MG PO TABS: ORAL_TABLET | ORAL | Status: DC

## 2012-08-12 NOTE — Patient Instructions (Signed)
Patient instructed to take medications as defined in the Anti-coagulation Track section of this encounter.  Patient instructed to take today's dose.  Patient verbalized understanding of these instructions.    

## 2012-08-12 NOTE — Progress Notes (Signed)
Anti-Coagulation Progress Note  [name] is a 51 y.o. male who is currently on an anti-coagulation regimen.    RECENT RESULTS: Recent results are below, the most recent result is correlated with a dose of 45 mg. per week: Lab Results  Component Value Date   INR 2.70 08/12/2012   INR 2.70 07/15/2012   INR 2.70 07/15/2012   PROTIME 38.4* 04/01/2012    ANTI-COAG DOSE:   Latest dosing instructions   Total Sun Mon Tue Wed Thu Fri Sat   45 5 mg 7.5 mg 5 mg 7.5 mg 7.5 mg 7.5 mg 5 mg    (5 mg1) (5 mg1.5) (5 mg1) (5 mg1.5) (5 mg1.5) (5 mg1.5) (5 mg1)         ANTICOAG SUMMARY: Anticoagulation Episode Summary              Current INR goal 2.0-3.0 Next INR check 09/09/2012   INR from last check 2.70 (08/12/2012)     Weekly max dose (mg)  Target end date Indefinite   Indications EMBOLISM AND THROMBOSIS, ARTERY, Long term current use of anticoagulant   INR check location [facility] Pref[facility] lab    Send INR reminders to [facility] IMP   Comments        Provider Role Specialty Phone number   Cliffton Asters, MD  Infectious Diseases [phone]        ANTICOAG TODAY: Anticoagulation Summary as of 08/12/2012              INR goal 2.0-3.0     Selected INR 2.70 (08/12/2012) Next INR check 09/09/2012   Weekly max dose (mg)  Target end date Indefinite   Indications EMBOLISM AND THROMBOSIS, ARTERY, Long term current use of anticoagulant    Anticoagulation Episode Summary              INR check location [facility] Preferred lab    Send INR reminders to [facility] IMP   Comments        Provider Role Specialty Phone number   Cliffton Asters, MD  Infectious Diseases [phone]        PATIENT INSTRUCTIONS: Patient Instructions  Patient instructed to take medications as defined in the Anti-coagulation Track section of this encounter.  Patient instructed to take today's dose.  Patient verbalized understanding of these instructions.        FOLLOW-UP Return in 4  weeks (on 09/09/2012) for Follow up INR at 2PM.  [name], [name] Pharm.D., CACP

## 2012-08-12 NOTE — Progress Notes (Signed)
Agree with Dr. Groce's Assessment/plan for this patient.  

## 2012-09-02 ENCOUNTER — Encounter: Payer: Self-pay | Admitting: Internal Medicine

## 2012-09-02 ENCOUNTER — Other Ambulatory Visit: Payer: Self-pay | Admitting: *Deleted

## 2012-09-02 ENCOUNTER — Telehealth: Payer: Self-pay | Admitting: *Deleted

## 2012-09-02 ENCOUNTER — Ambulatory Visit (INDEPENDENT_AMBULATORY_CARE_PROVIDER_SITE_OTHER): Payer: Medicare Other | Admitting: Internal Medicine

## 2012-09-02 VITALS — BP 126/76 | HR 86 | Temp 98.6°F | Resp 16 | Wt 145.5 lb

## 2012-09-02 DIAGNOSIS — F17200 Nicotine dependence, unspecified, uncomplicated: Secondary | ICD-10-CM

## 2012-09-02 DIAGNOSIS — E785 Hyperlipidemia, unspecified: Secondary | ICD-10-CM

## 2012-09-02 DIAGNOSIS — G8929 Other chronic pain: Secondary | ICD-10-CM

## 2012-09-02 DIAGNOSIS — G629 Polyneuropathy, unspecified: Secondary | ICD-10-CM

## 2012-09-02 DIAGNOSIS — G589 Mononeuropathy, unspecified: Secondary | ICD-10-CM | POA: Diagnosis not present

## 2012-09-02 LAB — LIPID PANEL
LDL Cholesterol: 130 mg/dL — ABNORMAL HIGH (ref 0–99)
Total CHOL/HDL Ratio: 6.2 Ratio
VLDL: 40 mg/dL (ref 0–40)

## 2012-09-02 LAB — HEPATIC FUNCTION PANEL
Bilirubin, Direct: 0.4 mg/dL — ABNORMAL HIGH (ref 0.0–0.3)
Total Bilirubin: 2.1 mg/dL — ABNORMAL HIGH (ref 0.3–1.2)

## 2012-09-02 MED ORDER — MORPHINE SULFATE ER 60 MG PO TBCR: 60.0000 mg | EXTENDED_RELEASE_TABLET | Freq: Two times a day (BID) | ORAL | Status: DC

## 2012-09-02 MED ORDER — OXYCODONE HCL 5 MG PO TABS: 5.0000 mg | ORAL_TABLET | Freq: Two times a day (BID) | ORAL | Status: DC

## 2012-09-02 MED ORDER — MORPHINE SULFATE ER 30 MG PO TBCR: 30.0000 mg | EXTENDED_RELEASE_TABLET | Freq: Two times a day (BID) | ORAL | Status: DC

## 2012-09-02 MED ORDER — GABAPENTIN 400 MG PO CAPS: 400.0000 mg | ORAL_CAPSULE | Freq: Four times a day (QID) | ORAL | Status: DC

## 2012-09-02 NOTE — Telephone Encounter (Signed)
RN spoke with the pt.  Pt has been taking the gabapentin QID as previously prescribed.  Call to [facility] to change frequency back to QID as originally prescribed.

## 2012-09-02 NOTE — Assessment & Plan Note (Signed)
Not interested in cessation

## 2012-09-02 NOTE — Progress Notes (Signed)
  Subjective:    Patient ID: [name], male    DOB: 07-22-1961, 51 y.o.   MRN: [id number]  HPI patient presents to clinic for followup of hyperlipidemia. States he continues to receive pain medication through infectious disease office. Continues to smoke tobacco and declines cessation. Taking Fenfibrate regularly. Fasting for labs this morning. States will receive influenza vaccine through ID office in the near future.  Past Medical History  Diagnosis Date  . History of chicken pox   . Hyperlipidemia    Past Surgical History  Procedure Date  . Appendectomy 1962  . Leg amputation above knee 2010    right leg    reports that he has been smoking Cigarettes.  He has been smoking about 1 pack per day. He has never used smokeless tobacco. He reports that he does not drink alcohol or use illicit drugs. family history includes Arthritis in his mother. Allergies  Allergen Reactions  . Dapsone     REACTION: fever  . Sulfamethoxazole W-Trimethoprim     REACTION: fever     Review of Systems see hpi     Objective:   Physical Exam  Nursing note and vitals reviewed. Constitutional: He appears well-developed and well-nourished. No distress.  HENT:  Head: Normocephalic and atraumatic.  Right Ear: External ear normal.  Left Ear: External ear normal.  Eyes: Conjunctivae normal are normal. No scleral icterus.  Neck: Neck supple. Carotid bruit is not present.  Cardiovascular: Normal rate, regular rhythm and normal heart sounds.  Exam reveals no gallop and no friction rub.   No murmur heard. Pulmonary/Chest: Effort normal and breath sounds normal. No respiratory distress. He has no wheezes. He has no rales.  Neurological: He is alert.  Skin: He is not diaphoretic.  Psychiatric: He has a normal mood and affect.          Assessment & Plan:

## 2012-09-02 NOTE — Telephone Encounter (Signed)
Patient called and advised he needs his Rx refilled and was advised he can pick it up Wednesday 09/04/12 after 2 pm.

## 2012-09-02 NOTE — Assessment & Plan Note (Signed)
Obtain fasting lipid profile and liver function tests. Maintained on fibrate

## 2012-09-03 ENCOUNTER — Other Ambulatory Visit: Payer: Medicare Other

## 2012-09-03 ENCOUNTER — Other Ambulatory Visit: Payer: Self-pay | Admitting: *Deleted

## 2012-09-03 DIAGNOSIS — R634 Abnormal weight loss: Secondary | ICD-10-CM

## 2012-09-03 MED ORDER — ENSURE PO LIQD: 1.0000 | Freq: Three times a day (TID) | ORAL | Status: DC

## 2012-09-03 NOTE — Telephone Encounter (Signed)
RX for Ensure faxed to [facility] in [place], [phone].

## 2012-09-09 ENCOUNTER — Ambulatory Visit (INDEPENDENT_AMBULATORY_CARE_PROVIDER_SITE_OTHER): Payer: Medicare Other | Admitting: Pharmacist

## 2012-09-09 DIAGNOSIS — I749 Embolism and thrombosis of unspecified artery: Secondary | ICD-10-CM

## 2012-09-09 DIAGNOSIS — Z7901 Long term (current) use of anticoagulants: Secondary | ICD-10-CM

## 2012-09-09 LAB — POCT INR: INR: 3.7

## 2012-09-09 NOTE — Progress Notes (Signed)
Anti-Coagulation Progress Note  [name] is a 51 y.o. male who is currently on an anti-coagulation regimen.    RECENT RESULTS: Recent results are below, the most recent result is correlated with a dose of 45 mg. per week: Lab Results  Component Value Date   INR 3.70 09/09/2012   INR 2.70 08/12/2012   INR 2.70 07/15/2012   PROTIME 38.4* 04/01/2012    ANTI-COAG DOSE:   Latest dosing instructions   Total Sun Mon Tue Wed Thu Fri Sat   40 5 mg 7.5 mg 5 mg 5 mg 7.5 mg 5 mg 5 mg    (5 mg1) (5 mg1.5) (5 mg1) (5 mg1) (5 mg1.5) (5 mg1) (5 mg1)         ANTICOAG SUMMARY: Anticoagulation Episode Summary              Current INR goal 2.0-3.0 Next INR check 09/30/2012   INR from last check 3.70! (09/09/2012)     Weekly max dose (mg)  Target end date Indefinite   Indications EMBOLISM AND THROMBOSIS, ARTERY [444.9], Long term current use of anticoagulant [V58.61]   INR check location [facility] Pref[facility]    Send INR reminders to [facility]   Comments        Provider Role Specialty Phone number   Cliffton Asters, MD  Infectious Diseases [phone]        ANTICOAG TODAY: Anticoagulation Summary as of 09/09/2012              INR goal 2.0-3.0     Selected INR 3.70! (09/09/2012) Next INR check 09/30/2012   Weekly max dose (mg)  Target end date Indefinite   Indications EMBOLISM AND THROMBOSIS, ARTERY [444.9], Long term current use of anticoagulant [V58.61]    Anticoagulation Episode Summary              INR check location [facility] Preferred lab    Send INR reminders to [facility] IMP   Comments        Provider Role Specialty Phone number   Cliffton Asters, MD  Infectious Diseases [phone]        PATIENT INSTRUCTIONS: Patient Instructions  Patient instructed to take medications as defined in the Anti-coagulation Track section of this encounter.  Patient instructed to take today's dose.  Patient verbalized understanding of these instructions.          FOLLOW-UP Return in 3 weeks (on 09/30/2012) for Follow up INR at 2PM.  [name], [name] Pharm.D., CACP

## 2012-09-09 NOTE — Patient Instructions (Signed)
Patient instructed to take medications as defined in the Anti-coagulation Track section of this encounter.  Patient instructed to take today's dose.  Patient verbalized understanding of these instructions.    

## 2012-09-10 ENCOUNTER — Other Ambulatory Visit: Payer: Medicare Other

## 2012-09-10 DIAGNOSIS — B20 Human immunodeficiency virus [HIV] disease: Secondary | ICD-10-CM | POA: Diagnosis not present

## 2012-09-10 DIAGNOSIS — Z79899 Other long term (current) drug therapy: Secondary | ICD-10-CM

## 2012-09-10 LAB — LIPID PANEL
Cholesterol: 204 mg/dL — ABNORMAL HIGH (ref 0–200)
HDL: 35 mg/dL — ABNORMAL LOW (ref >39)
Total CHOL/HDL Ratio: 5.8 Ratio

## 2012-09-10 NOTE — Addendum Note (Signed)
Addended by: Mariea Clonts D on: 09/10/2012 09:25 AM   Modules accepted: Orders

## 2012-09-11 ENCOUNTER — Telehealth: Payer: Self-pay | Admitting: *Deleted

## 2012-09-11 DIAGNOSIS — R945 Abnormal results of liver function studies: Secondary | ICD-10-CM

## 2012-09-11 LAB — HIV-1 RNA QUANT-NO REFLEX-BLD: HIV-1 RNA Quant, Log: <1.3 {Log} (ref <1.30)

## 2012-09-11 LAB — T-HELPER CELL (CD4) - (RCID CLINIC ONLY): CD4 % Helper T Cell: 25 % — ABNORMAL LOW (ref 33–55)

## 2012-09-11 NOTE — Telephone Encounter (Signed)
Patient informed, understood & agreed, future lab order placed/SLS

## 2012-09-11 NOTE — Telephone Encounter (Signed)
Message copied by Regis Bill on Wed Sep 11, 2012  2:54 PM ------      Message from: Edwyna Perfect      Created: Sun Sep 08, 2012  6:31 PM       TG/chol improving. Liver tests did go up. Repeat lft in ~2weeks-abn lft

## 2012-09-17 ENCOUNTER — Ambulatory Visit: Payer: Medicare Other | Admitting: Internal Medicine

## 2012-09-24 ENCOUNTER — Encounter: Payer: Self-pay | Admitting: Internal Medicine

## 2012-09-24 ENCOUNTER — Ambulatory Visit (INDEPENDENT_AMBULATORY_CARE_PROVIDER_SITE_OTHER): Payer: Medicare Other | Admitting: Internal Medicine

## 2012-09-24 VITALS — BP 152/79 | HR 87 | Temp 98.3°F | Ht 72.0 in | Wt 144.2 lb

## 2012-09-24 DIAGNOSIS — B20 Human immunodeficiency virus [HIV] disease: Secondary | ICD-10-CM | POA: Diagnosis not present

## 2012-09-24 DIAGNOSIS — Z23 Encounter for immunization: Secondary | ICD-10-CM | POA: Diagnosis not present

## 2012-09-24 DIAGNOSIS — Z79899 Other long term (current) drug therapy: Secondary | ICD-10-CM

## 2012-09-24 NOTE — Progress Notes (Signed)
Patient ID: [name], male   DOB: 1961/07/08, 51 y.o.   MRN: [id number]     [facility] for Infectious Disease  Patient Active Problem List  Diagnosis  . METHICILLIN RESISTANT STAPHYLOCOCCUS AUREUS INFECTION  . HIV DISEASE  . SHINGLES, RECURRENT  . HEPATITIS B  . HYPOGONADISM  . DEFICIENCY OF OTHER VITAMINS  . DYSLIPIDEMIA  . PSYCHOSIS  . ALCOHOL ABUSE  . CIGARETTE SMOKER  . PHANTOM LIMB SYNDROME  . PERIPHERAL NEUROPATHY  . EMBOLISM AND THROMBOSIS, ARTERY  . ALLERGIC RHINITIS  . PULMONARY NODULE  . DENTAL CARIES  . GERD  . ERECTILE DYSFUNCTION, ORGANIC  . ENLARGEMENT OF LYMPH NODES  . APPENDECTOMY, HX OF  . AKA, RIGHT, HX OF  . Long term current use of anticoagulant  . Phantom limb syndrome with pain    Patient's Medications  New Prescriptions   No medications on file  Previous Medications   ASPIRIN 81 MG TABLET    Take 81 mg by mouth daily.     ENSURE (ENSURE)    Take 1 Can by mouth 3 (three) times daily between meals.   FENOFIBRATE (TRICOR) 145 MG TABLET    Take 1 tablet (145 mg total) by mouth daily.   GABAPENTIN (NEURONTIN) 400 MG CAPSULE    Take 1 capsule (400 mg total) by mouth 4 (four) times daily.   MORPHINE (MS CONTIN) 30 MG 12 HR TABLET    Take 1 tablet (30 mg total) by mouth 2 (two) times daily.   MORPHINE (MS CONTIN) 60 MG 12 HR TABLET    Take 1 tablet (60 mg total) by mouth 2 (two) times daily.   NORVIR 100 MG TABS    TAKE 1 TABLET BY MOUTH EVERY DAY FOR 30 DAYS   OXYCODONE (OXY IR/ROXICODONE) 5 MG IMMEDIATE RELEASE TABLET    Take 1 tablet (5 mg total) by mouth 2 (two) times daily.   REYATAZ 300 MG CAPSULE    TAKE 1 CAPSULE BY MOUTH ONCE A DAY   STAVUDINE (ZERIT) 40 MG CAPSULE    1 CAPSULE BY MOUTH TWICE A DAY   TENOFOVIR ([name]) 300 MG TABLET    Take 1 tablet (300 mg total) by mouth daily.   WARFARIN (COUMADIN) 5 MG TABLET    Take as directed by anticoagulation clinic provider.  Modified Medications   No medications on file  Discontinued  Medications   No medications on file    Subjective: [name] is in for his routine visit. He has not missed any doses of his HIV medications since his last visit.  Objective: Temp: 98.3 F (36.8 C) (11/26 0941) Temp src: Oral (11/26 0941) BP: 152/79 mmHg (11/26 0941) Pulse Rate: 87  (11/26 0941)  General: He is in good spirits Skin: No rash Lungs: Clear Cor: Regular S1 and S2 no murmurs  Lab Results HIV 1 RNA Quant (copies/mL)  Date Value  09/10/2012 <20   03/07/2012 <20   09/05/2011 <20      CD4 T Cell Abs (cmm)  Date Value  09/10/2012 1200   03/07/2012 1070   09/05/2011 1240      Assessment: His HIV infection remains under excellent control. I will continue his current antiretroviral regimen. He has mild elevation of his bilirubin which is a benign abnormality related to his Reyataz therapy.  Plan: 1. Continue current antiretroviral medications 2. Influenza vaccination today 3. Followup after lab work in 6 months   Cliffton Asters, MD [facility] for Infectious Disease [facility]  Group [id number] pager   [phone] cell 09/24/2012, 9:58 AM

## 2012-09-30 ENCOUNTER — Ambulatory Visit (INDEPENDENT_AMBULATORY_CARE_PROVIDER_SITE_OTHER): Payer: Medicare Other | Admitting: Pharmacist

## 2012-09-30 DIAGNOSIS — I749 Embolism and thrombosis of unspecified artery: Secondary | ICD-10-CM | POA: Diagnosis not present

## 2012-09-30 DIAGNOSIS — Z7901 Long term (current) use of anticoagulants: Secondary | ICD-10-CM | POA: Diagnosis not present

## 2012-09-30 LAB — POCT INR: INR: 2.9

## 2012-09-30 MED ORDER — WARFARIN SODIUM 5 MG PO TABS: ORAL_TABLET | ORAL | Status: DC

## 2012-09-30 NOTE — Progress Notes (Signed)
Anti-Coagulation Progress Note  [name] is a 51 y.o. male who is currently on an anti-coagulation regimen.    RECENT RESULTS: Recent results are below, the most recent result is correlated with a dose of 40 mg. per week: Lab Results  Component Value Date   INR 2.90 09/30/2012   INR 3.70 09/09/2012   INR 2.70 08/12/2012   PROTIME 38.4* 04/01/2012    ANTI-COAG DOSE:   Latest dosing instructions   Total Sun Mon Tue Wed Thu Fri Sat   40 5 mg 7.5 mg 5 mg 5 mg 7.5 mg 5 mg 5 mg    (5 mg1) (5 mg1.5) (5 mg1) (5 mg1) (5 mg1.5) (5 mg1) (5 mg1)         ANTICOAG SUMMARY: Anticoagulation Episode Summary              Current INR goal 2.0-3.0 Next INR check 10/28/2012   INR from last check 2.90 (09/30/2012)     Weekly max dose (mg)  Target end date Indefinite   Indications EMBOLISM AND THROMBOSIS, ARTERY [444.9], Long term current use of anticoagulant [V58.61]   INR check location [facility] Pref[facility] lab    Send INR reminders to [facility] IMP   Comments        Provider Role Specialty Phone number   Cliffton Asters, MD  Infectious Diseases [phone]        ANTICOAG TODAY: Anticoagulation Summary as of 09/30/2012              INR goal 2.0-3.0     Selected INR 2.90 (09/30/2012) Next INR check 10/28/2012   Weekly max dose (mg)  Target end date Indefinite   Indications EMBOLISM AND THROMBOSIS, ARTERY [444.9], Long term current use of anticoagulant [V58.61]    Anticoagulation Episode Summary              INR check location [facility] Preferred lab    Send INR reminders to [facility] IMP   Comments        Provider Role Specialty Phone number   Cliffton Asters, MD  Infectious Diseases [phone]        PATIENT INSTRUCTIONS: Patient Instructions  Patient instructed to take medications as defined in the Anti-coagulation Track section of this encounter.  Patient instructed to take today's dose.  Patient verbalized understanding of these instructions.          FOLLOW-UP Return in 4 weeks (on 10/28/2012) for Follow up INR at 2PM.  [name], [name] Pharm.D., CACP

## 2012-09-30 NOTE — Patient Instructions (Signed)
Patient instructed to take medications as defined in the Anti-coagulation Track section of this encounter.  Patient instructed to take today's dose.  Patient verbalized understanding of these instructions.    

## 2012-10-01 NOTE — Progress Notes (Signed)
Agree with Dr. Groce's plan. 

## 2012-10-02 ENCOUNTER — Other Ambulatory Visit: Payer: Self-pay | Admitting: *Deleted

## 2012-10-02 DIAGNOSIS — G8929 Other chronic pain: Secondary | ICD-10-CM

## 2012-10-02 MED ORDER — OXYCODONE HCL 5 MG PO TABS: 5.0000 mg | ORAL_TABLET | Freq: Two times a day (BID) | ORAL | Status: DC

## 2012-10-02 MED ORDER — MORPHINE SULFATE ER 30 MG PO TBCR: 30.0000 mg | EXTENDED_RELEASE_TABLET | Freq: Two times a day (BID) | ORAL | Status: DC

## 2012-10-02 MED ORDER — MORPHINE SULFATE ER 60 MG PO TBCR: 60.0000 mg | EXTENDED_RELEASE_TABLET | Freq: Two times a day (BID) | ORAL | Status: DC

## 2012-10-28 ENCOUNTER — Ambulatory Visit (INDEPENDENT_AMBULATORY_CARE_PROVIDER_SITE_OTHER): Payer: Medicare Other | Admitting: Pharmacist

## 2012-10-28 DIAGNOSIS — I749 Embolism and thrombosis of unspecified artery: Secondary | ICD-10-CM | POA: Diagnosis not present

## 2012-10-28 DIAGNOSIS — Z7901 Long term (current) use of anticoagulants: Secondary | ICD-10-CM | POA: Diagnosis not present

## 2012-10-28 NOTE — Progress Notes (Signed)
Anti-Coagulation Progress Note  [name] is a 51 y.o. male who is currently on an anti-coagulation regimen.    RECENT RESULTS: Recent results are below, the most recent result is correlated with a dose of 40 mg. per week: Lab Results  Component Value Date   INR 2.20 10/28/2012   INR 2.90 09/30/2012   INR 3.70 09/09/2012   PROTIME 38.4* 04/01/2012    ANTI-COAG DOSE:   Latest dosing instructions   Total Sun Mon Tue Wed Thu Fri Sat   40 5 mg 7.5 mg 5 mg 5 mg 7.5 mg 5 mg 5 mg    (5 mg1) (5 mg1.5) (5 mg1) (5 mg1) (5 mg1.5) (5 mg1) (5 mg1)         ANTICOAG SUMMARY: Anticoagulation Episode Summary              Current INR goal 2.0-3.0 Next INR check 11/25/2012   INR from last check 2.20 (10/28/2012)     Weekly max dose (mg)  Target end date Indefinite   Indications EMBOLISM AND THROMBOSIS, ARTERY [444.9], Long term current use of anticoagulant [V58.61]   INR check location [facility] Preferred lab    Send INR reminders to [facility]   Comments        Provider Role Specialty Phone number   Cliffton Asters, MD  Infectious Diseases [phone]        ANTICOAG TODAY: Anticoagulation Summary as of 10/28/2012              INR goal 2.0-3.0     Selected INR 2.20 (10/28/2012) Next INR check 11/25/2012   Weekly max dose (mg)  Target end date Indefinite   Indications EMBOLISM AND THROMBOSIS, ARTERY [444.9], Long term current use of anticoagulant [V58.61]    Anticoagulation Episode Summary              INR check location [facility] Preferred lab    Send INR reminders to [facility]   Comments        Provider Role Specialty Phone number   Cliffton Asters, MD  Infectious Diseases [phone]        PATIENT INSTRUCTIONS: Patient Instructions  Patient instructed to take medications as defined in the Anti-coagulation Track section of this encounter.  Patient instructed to take today's dose.  Patient verbalized understanding of these instructions.          FOLLOW-UP Return in 4 weeks (on 11/25/2012) for Follow up INR at 2PM.  [name], [name] Pharm.D., CACP

## 2012-10-28 NOTE — Patient Instructions (Signed)
Patient instructed to take medications as defined in the Anti-coagulation Track section of this encounter.  Patient instructed to take today's dose.  Patient verbalized understanding of these instructions.    

## 2012-10-31 ENCOUNTER — Other Ambulatory Visit: Payer: Self-pay | Admitting: Licensed Clinical Social Worker

## 2012-10-31 DIAGNOSIS — G8929 Other chronic pain: Secondary | ICD-10-CM

## 2012-10-31 MED ORDER — OXYCODONE HCL 5 MG PO TABS: 5.0000 mg | ORAL_TABLET | Freq: Two times a day (BID) | ORAL | Status: DC

## 2012-10-31 MED ORDER — MORPHINE SULFATE ER 30 MG PO TBCR: 30.0000 mg | EXTENDED_RELEASE_TABLET | Freq: Two times a day (BID) | ORAL | Status: DC

## 2012-10-31 MED ORDER — MORPHINE SULFATE ER 60 MG PO TBCR: 60.0000 mg | EXTENDED_RELEASE_TABLET | Freq: Two times a day (BID) | ORAL | Status: DC

## 2012-11-25 ENCOUNTER — Other Ambulatory Visit: Payer: Self-pay | Admitting: *Deleted

## 2012-11-25 ENCOUNTER — Ambulatory Visit (INDEPENDENT_AMBULATORY_CARE_PROVIDER_SITE_OTHER): Payer: Medicare Other | Admitting: Pharmacist

## 2012-11-25 DIAGNOSIS — Z7901 Long term (current) use of anticoagulants: Secondary | ICD-10-CM | POA: Diagnosis not present

## 2012-11-25 DIAGNOSIS — I749 Embolism and thrombosis of unspecified artery: Secondary | ICD-10-CM | POA: Diagnosis not present

## 2012-11-25 DIAGNOSIS — B20 Human immunodeficiency virus [HIV] disease: Secondary | ICD-10-CM

## 2012-11-25 LAB — POCT INR: INR: 2.6

## 2012-11-25 MED ORDER — ATAZANAVIR SULFATE 300 MG PO CAPS: 300.0000 mg | ORAL_CAPSULE | Freq: Every day | ORAL | Status: DC

## 2012-11-25 MED ORDER — TENOFOVIR DISOPROXIL FUMARATE 300 MG PO TABS: 300.0000 mg | ORAL_TABLET | Freq: Every day | ORAL | Status: DC

## 2012-11-25 MED ORDER — STAVUDINE 40 MG PO CAPS: 40.0000 mg | ORAL_CAPSULE | Freq: Two times a day (BID) | ORAL | Status: DC

## 2012-11-25 MED ORDER — RITONAVIR 100 MG PO TABS: 100.0000 mg | ORAL_TABLET | Freq: Every day | ORAL | Status: DC

## 2012-11-25 NOTE — Patient Instructions (Signed)
Patient instructed to take medications as defined in the Anti-coagulation Track section of this encounter.  Patient instructed to take today's dose.  Patient verbalized understanding of these instructions.    

## 2012-11-25 NOTE — Progress Notes (Signed)
Anti-Coagulation Progress Note  [name] is a 52 y.o. male who is currently on an anti-coagulation regimen.    RECENT RESULTS: Recent results are below, the most recent result is correlated with a dose of 40 mg. per week: Lab Results  Component Value Date   INR 2.60 11/25/2012   INR 2.20 10/28/2012   INR 2.90 09/30/2012   PROTIME 38.4* 04/01/2012    ANTI-COAG DOSE:   Latest dosing instructions   Total Sun Mon Tue Wed Thu Fri Sat   40 5 mg 7.5 mg 5 mg 5 mg 7.5 mg 5 mg 5 mg    (5 mg1) (5 mg1.5) (5 mg1) (5 mg1) (5 mg1.5) (5 mg1) (5 mg1)         ANTICOAG SUMMARY: Anticoagulation Episode Summary              Current INR goal 2.0-3.0 Next INR check 12/23/2012   INR from last check 2.60 (11/25/2012)     Weekly max dose (mg)  Target end date Indefinite   Indications EMBOLISM AND THROMBOSIS, ARTERY [444.9], Long term current use of anticoagulant [V58.61]   INR check location [facility] Preferred lab    Send INR reminders to [facility] IMP   Comments        Provider Role Specialty Phone number   Cliffton Asters, MD  Infectious Diseases [phone]        ANTICOAG TODAY: Anticoagulation Summary as of 11/25/2012              INR goal 2.0-3.0     Selected INR 2.60 (11/25/2012) Next INR check 12/23/2012   Weekly max dose (mg)  Target end date Indefinite   Indications EMBOLISM AND THROMBOSIS, ARTERY [444.9], Long term current use of anticoagulant [V58.61]    Anticoagulation Episode Summary              INR check location [facility] Preferred lab    Send INR reminders to [facility] IMP   Comments        Provider Role Specialty Phone number   Cliffton Asters, MD  Infectious Diseases [phone]        PATIENT INSTRUCTIONS: Patient Instructions  Patient instructed to take medications as defined in the Anti-coagulation Track section of this encounter.  Patient instructed to take today's dose.  Patient verbalized understanding of these instructions.          FOLLOW-UP Return in 4 weeks (on 12/23/2012) for Follow up INR at 2:15PM.  [name], [name] Pharm.D., CACP

## 2012-11-28 ENCOUNTER — Other Ambulatory Visit: Payer: Self-pay | Admitting: Licensed Clinical Social Worker

## 2012-11-28 DIAGNOSIS — G8929 Other chronic pain: Secondary | ICD-10-CM

## 2012-11-28 MED ORDER — MORPHINE SULFATE ER 60 MG PO TBCR: 60.0000 mg | EXTENDED_RELEASE_TABLET | Freq: Two times a day (BID) | ORAL | Status: DC

## 2012-11-28 MED ORDER — OXYCODONE HCL 5 MG PO TABS: 5.0000 mg | ORAL_TABLET | Freq: Two times a day (BID) | ORAL | Status: DC

## 2012-11-28 MED ORDER — MORPHINE SULFATE ER 30 MG PO TBCR: 30.0000 mg | EXTENDED_RELEASE_TABLET | Freq: Two times a day (BID) | ORAL | Status: DC

## 2012-12-23 ENCOUNTER — Ambulatory Visit (INDEPENDENT_AMBULATORY_CARE_PROVIDER_SITE_OTHER): Payer: Medicare Other | Admitting: Pharmacist

## 2012-12-23 DIAGNOSIS — Z7901 Long term (current) use of anticoagulants: Secondary | ICD-10-CM | POA: Diagnosis not present

## 2012-12-23 DIAGNOSIS — I749 Embolism and thrombosis of unspecified artery: Secondary | ICD-10-CM | POA: Diagnosis not present

## 2012-12-23 NOTE — Progress Notes (Signed)
Anti-Coagulation Progress Note  Rayner Erman is a 52 y.o. male who is currently on an anti-coagulation regimen.    RECENT RESULTS: Recent results are below, the most recent result is correlated with a dose of 50 mg. per week: Lab Results  Component Value Date   INR 2.60 11/25/2012   INR 2.20 10/28/2012   INR 2.90 09/30/2012   PROTIME 38.4* 04/01/2012    ANTI-COAG DOSE: Anticoagulation Dose Instructions as of 12/23/2012     [name] Tue Wed Thu Fri Sat   New Dose 5 mg 7.5 mg 5 mg 5 mg 7.5 mg 5 mg 5 mg       ANTICOAG SUMMARY: Anticoagulation Episode Summary   Current INR goal 2.0-3.0  Next INR check 01/20/2013  INR from last check 2.60 (11/25/2012)  Weekly max dose   Target end date Indefinite  [facility]  Preferred lab   Send INR reminders to [facility]   Indications  EMBOLISM AND THROMBOSIS ARTERY [444.9] Long term current use of anticoagulant [V58.61]        Comments       Anticoagulation Care Providers   Provider Role Specialty Phone number   Cliffton Asters, MD  Infectious Diseases [phone]      ANTICOAG TODAY: Anticoagulation Summary as of 12/23/2012   INR goal 2.0-3.0  Selected INR 2.60 (11/25/2012)  Next INR check 01/20/2013  Target end date Indefinite   Indications  EMBOLISM AND THROMBOSIS ARTERY [444.9] Long term current use of anticoagulant [V58.61]      Anticoagulation Episode Summary   [facility]   Preferred lab    Send INR reminders to [facility]   Comments     Anticoagulation Care Providers   Provider Role Specialty Phone number   Cliffton Asters, MD  Infectious Diseases [phone]      PATIENT INSTRUCTIONS: Patient Instructions  Patient instructed to take medications as defined in the Anti-coagulation Track section of this encounter.  Patient instructed to take today's dose.  Patient verbalized understanding of these instructions.       FOLLOW-UP Return in 4 weeks (on 01/20/2013) for  Follow up INR at 2PM.  [name], [name] Pharm.D., CACP

## 2012-12-23 NOTE — Patient Instructions (Signed)
Patient instructed to take medications as defined in the Anti-coagulation Track section of this encounter.  Patient instructed to take today's dose.  Patient verbalized understanding of these instructions.    

## 2012-12-27 ENCOUNTER — Other Ambulatory Visit: Payer: Self-pay | Admitting: Licensed Clinical Social Worker

## 2012-12-27 DIAGNOSIS — G8929 Other chronic pain: Secondary | ICD-10-CM

## 2012-12-27 MED ORDER — OXYCODONE HCL 5 MG PO TABS: 5.0000 mg | ORAL_TABLET | Freq: Two times a day (BID) | ORAL | Status: DC

## 2012-12-27 MED ORDER — MORPHINE SULFATE ER 60 MG PO TBCR: 60.0000 mg | EXTENDED_RELEASE_TABLET | Freq: Two times a day (BID) | ORAL | Status: DC

## 2012-12-27 MED ORDER — MORPHINE SULFATE ER 30 MG PO TBCR: 30.0000 mg | EXTENDED_RELEASE_TABLET | Freq: Two times a day (BID) | ORAL | Status: DC

## 2013-01-20 ENCOUNTER — Ambulatory Visit (INDEPENDENT_AMBULATORY_CARE_PROVIDER_SITE_OTHER): Payer: Medicare Other | Admitting: Pharmacist

## 2013-01-20 DIAGNOSIS — Z7901 Long term (current) use of anticoagulants: Secondary | ICD-10-CM | POA: Diagnosis not present

## 2013-01-20 DIAGNOSIS — I749 Embolism and thrombosis of unspecified artery: Secondary | ICD-10-CM | POA: Diagnosis not present

## 2013-01-20 LAB — POCT INR: INR: 2.3

## 2013-01-20 NOTE — Progress Notes (Signed)
Anti-Coagulation Progress Note  [name] is a 52 y.o. male who is currently on an anti-coagulation regimen.    RECENT RESULTS: Recent results are below, the most recent result is correlated with a dose of 40 mg. per week: Lab Results  Component Value Date   INR 2.30 01/20/2013   INR 2.60 11/25/2012   INR 2.20 10/28/2012   PROTIME 38.4* 04/01/2012    ANTI-COAG DOSE: Anticoagulation Dose Instructions as of 01/20/2013     [name] Tue Wed Thu Fri Sat   New Dose 5 mg 7.5 mg 5 mg 5 mg 7.5 mg 5 mg 5 mg       ANTICOAG SUMMARY: Anticoagulation Episode Summary   Current INR goal 2.0-3.0  Next INR check 02/17/2013  INR from last check 2.30 (01/20/2013)  Weekly max dose   Target end date Indefinite  [facility] location [facility]  Preferred lab   Send INR reminders to [facility]   Indications  EMBOLISM AND THROMBOSIS ARTERY [444.9] Long term current use of anticoagulant [V58.61]        Comments       Anticoagulation Care Providers   Provider Role Specialty Phone number   Cliffton Asters, MD  Infectious Diseases [phone]      ANTICOAG TODAY: Anticoagulation Summary as of 01/20/2013   INR goal 2.0-3.0  Selected INR 2.30 (01/20/2013)  Next INR check 02/17/2013  Target end date Indefinite   Indications  EMBOLISM AND THROMBOSIS ARTERY [444.9] Long term current use of anticoagulant [V58.61]      Anticoagulation Episode Summary   [facility] check location [facility]   Preferred lab    Send INR reminders to [facility]   Comments     Anticoagulation Care Providers   Provider Role Specialty Phone number   Cliffton Asters, MD  Infectious Diseases [phone]      PATIENT INSTRUCTIONS: Patient Instructions  Patient instructed to take medications as defined in the Anti-coagulation Track section of this encounter.  Patient instructed to take today's dose.  Patient verbalized understanding of these instructions.       FOLLOW-UP Return in 4 weeks (on 02/17/2013) for  Follow up INR at 2:30PM.  [name], [name] Pharm.D., CACP

## 2013-01-20 NOTE — Patient Instructions (Signed)
Patient instructed to take medications as defined in the Anti-coagulation Track section of this encounter.  Patient instructed to take today's dose.  Patient verbalized understanding of these instructions.    

## 2013-01-29 ENCOUNTER — Other Ambulatory Visit: Payer: Self-pay | Admitting: *Deleted

## 2013-01-29 DIAGNOSIS — G8929 Other chronic pain: Secondary | ICD-10-CM

## 2013-01-29 MED ORDER — MORPHINE SULFATE ER 60 MG PO TBCR: 60.0000 mg | EXTENDED_RELEASE_TABLET | Freq: Two times a day (BID) | ORAL | Status: DC

## 2013-01-29 MED ORDER — MORPHINE SULFATE ER 30 MG PO TBCR: 30.0000 mg | EXTENDED_RELEASE_TABLET | Freq: Two times a day (BID) | ORAL | Status: DC

## 2013-01-29 MED ORDER — OXYCODONE HCL 5 MG PO TABS: 5.0000 mg | ORAL_TABLET | Freq: Two times a day (BID) | ORAL | Status: DC

## 2013-01-29 NOTE — Progress Notes (Signed)
Refills due before Dr. Orvan Falconer in clinic. Dr. Ninetta Lights signing for Dr. Orvan Falconer. Andree Coss, RN

## 2013-01-30 NOTE — Progress Notes (Signed)
I have reviewed the note by Dr. Alexandria Lodge.  I agree with plan as documented.  INR at goal.

## 2013-02-17 ENCOUNTER — Ambulatory Visit (INDEPENDENT_AMBULATORY_CARE_PROVIDER_SITE_OTHER): Payer: Medicare Other | Admitting: Pharmacist

## 2013-02-17 DIAGNOSIS — I749 Embolism and thrombosis of unspecified artery: Secondary | ICD-10-CM

## 2013-02-17 DIAGNOSIS — Z7901 Long term (current) use of anticoagulants: Secondary | ICD-10-CM | POA: Diagnosis not present

## 2013-02-17 NOTE — Patient Instructions (Signed)
Patient instructed to take medications as defined in the Anti-coagulation Track section of this encounter.  Patient instructed to take today's dose.  Patient verbalized understanding of these instructions.    

## 2013-02-17 NOTE — Progress Notes (Signed)
Anti-Coagulation Progress Note  Auther Lyerly is a 52 y.o. male who is currently on an anti-coagulation regimen.    RECENT RESULTS: Recent results are below, the most recent result is correlated with a dose of 47.5 mg. per week: Lab Results  Component Value Date   INR 2.00 02/17/2013   INR 2.30 01/20/2013   INR 2.60 11/25/2012   PROTIME 38.4* 04/01/2012    ANTI-COAG DOSE: Anticoagulation Dose Instructions as of 02/17/2013     [name] Tue Wed Thu Fri Sat   New Dose 7.5 mg 7.5 mg 5 mg 7.5 mg 5 mg 7.5 mg 5 mg       ANTICOAG SUMMARY: Anticoagulation Episode Summary   Current INR goal 2.0-3.0  Next INR check 03/17/2013  INR from last check 2.00 (02/17/2013)  Weekly max dose   Target end date Indefinite  [facility]  Preferred lab   Send INR reminders to [facility]   Indications  EMBOLISM AND THROMBOSIS ARTERY [444.9] Long term current use of anticoagulant [V58.61]        Comments       Anticoagulation Care Providers   Provider Role Specialty Phone number   Cliffton Asters, MD  Infectious Diseases [phone]      ANTICOAG TODAY: Anticoagulation Summary as of 02/17/2013   INR goal 2.0-3.0  Selected INR 2.00 (02/17/2013)  Next INR check 03/17/2013  Target end date Indefinite   Indications  EMBOLISM AND THROMBOSIS ARTERY [444.9] Long term current use of anticoagulant [V58.61]      Anticoagulation Episode Summary   [facility]   Preferred lab    Send INR reminders to [facility]   Comments     Anticoagulation Care Providers   Provider Role Specialty Phone number   Cliffton Asters, MD  Infectious Diseases [phone]      PATIENT INSTRUCTIONS: Patient Instructions  Patient instructed to take medications as defined in the Anti-coagulation Track section of this encounter.  Patient instructed to take today's dose.  Patient verbalized understanding of these instructions.       FOLLOW-UP Return in 4 weeks (on  03/17/2013) for Follow up INR at 2:30PM.  [name], [name] Pharm.D., CACP

## 2013-02-21 ENCOUNTER — Other Ambulatory Visit: Payer: Self-pay | Admitting: Licensed Clinical Social Worker

## 2013-02-21 DIAGNOSIS — R634 Abnormal weight loss: Secondary | ICD-10-CM

## 2013-02-21 MED ORDER — ENSURE PO LIQD: 1.0000 | Freq: Three times a day (TID) | ORAL | Status: DC

## 2013-02-25 ENCOUNTER — Other Ambulatory Visit: Payer: Self-pay | Admitting: Internal Medicine

## 2013-02-27 ENCOUNTER — Other Ambulatory Visit: Payer: Self-pay | Admitting: *Deleted

## 2013-02-27 DIAGNOSIS — G8929 Other chronic pain: Secondary | ICD-10-CM

## 2013-02-27 MED ORDER — MORPHINE SULFATE ER 60 MG PO TBCR: 60.0000 mg | EXTENDED_RELEASE_TABLET | Freq: Two times a day (BID) | ORAL | Status: DC

## 2013-02-27 MED ORDER — OXYCODONE HCL 5 MG PO TABS: 5.0000 mg | ORAL_TABLET | Freq: Two times a day (BID) | ORAL | Status: DC

## 2013-02-27 MED ORDER — MORPHINE SULFATE ER 30 MG PO TBCR: 30.0000 mg | EXTENDED_RELEASE_TABLET | Freq: Two times a day (BID) | ORAL | Status: DC

## 2013-03-03 ENCOUNTER — Ambulatory Visit: Payer: Medicare Other | Admitting: Internal Medicine

## 2013-03-05 ENCOUNTER — Encounter: Payer: Self-pay | Admitting: Family Medicine

## 2013-03-05 ENCOUNTER — Ambulatory Visit (INDEPENDENT_AMBULATORY_CARE_PROVIDER_SITE_OTHER): Payer: Medicare Other | Admitting: Family Medicine

## 2013-03-05 VITALS — BP 128/76 | HR 87 | Temp 98.4°F | Ht 72.0 in | Wt 139.0 lb

## 2013-03-05 DIAGNOSIS — M25559 Pain in unspecified hip: Secondary | ICD-10-CM

## 2013-03-05 DIAGNOSIS — B20 Human immunodeficiency virus [HIV] disease: Secondary | ICD-10-CM

## 2013-03-05 DIAGNOSIS — Z0000 Encounter for general adult medical examination without abnormal findings: Secondary | ICD-10-CM | POA: Diagnosis not present

## 2013-03-05 DIAGNOSIS — Z7901 Long term (current) use of anticoagulants: Secondary | ICD-10-CM

## 2013-03-05 DIAGNOSIS — M25552 Pain in left hip: Secondary | ICD-10-CM

## 2013-03-05 NOTE — Patient Instructions (Addendum)
Next visit annual   Nicotine Addiction Nicotine can act as both a stimulant (excites/activates) and a sedative (calms/quiets). Immediately after exposure to nicotine, there is a "kick" caused in part by the drug's stimulation of the adrenal glands and resulting discharge of adrenaline (epinephrine). The rush of adrenaline stimulates the body and causes a sudden release of sugar. This means that smokers are always slightly hyperglycemic. Hyperglycemic means that the blood sugar is high, just like in diabetics. Nicotine also decreases the amount of insulin which helps control sugar levels in the body. There is an increase in blood pressure, breathing, and the rate of heart beats.  In addition, nicotine indirectly causes a release of dopamine in the brain that controls pleasure and motivation. A similar reaction is seen with other drugs of abuse, such as cocaine and heroin. This dopamine release is thought to cause the pleasurable sensations when smoking. In some different cases, nicotine can also create a calming effect, depending on sensitivity of the smoker's nervous system and the dose of nicotine taken. WHAT HAPPENS WHEN NICOTINE IS TAKEN FOR LONG PERIODS OF TIME?  Long-term use of nicotine results in addiction. It is difficult to stop.  Repeated use of nicotine creates tolerance. Higher doses of nicotine are needed to get the "kick." When nicotine use is stopped, withdrawal may last a month or more. Withdrawal may begin within a few hours after the last cigarette. Symptoms peak within the first few days and may lessen within a few weeks. For some people, however, symptoms may last for months or longer. Withdrawal symptoms include:   Irritability.  Craving.  Learning and attention deficits.  Sleep disturbances.  Increased appetite. Craving for tobacco may last for 6 months or longer. Many behaviors done while using nicotine can also play a part in the severity of withdrawal symptoms. For some  people, the feel, smell, and sight of a cigarette and the ritual of obtaining, handling, lighting, and smoking the cigarette are closely linked with the pleasure of smoking. When stopped, they also miss the related behaviors which make the withdrawal or craving worse. While nicotine gum and patches may lessen the drug aspects of withdrawal, cravings often persist. WHAT ARE THE MEDICAL CONSEQUENCES OF NICOTINE USE?  Nicotine addiction accounts for one-third of all cancers. The top cancer caused by tobacco is lung cancer. Lung cancer is the number one cancer killer of both men and women.  Smoking is also associated with cancers of the:  Mouth.  Pharynx.  Larynx.  Esophagus.  Stomach.  Pancreas.  Cervix.  Kidney.  Ureter.  Bladder.  Smoking also causes lung diseases such as lasting (chronic) bronchitis and emphysema.  It worsens asthma in adults and children.  Smoking increases the risk of heart disease, including:  Stroke.  Heart attack.  Vascular disease.  Aneurysm.  Passive or secondary smoke can also increase medical risks including:  Asthma in children.  Sudden Infant Death Syndrome (SIDS).  Additionally, dropped cigarettes are the leading cause of residential fire fatalities.  Nicotine poisoning has been reported from accidental ingestion of tobacco products by children and pets. Death usually results in a few minutes from respiratory failure (when a person stops breathing) caused by paralysis. TREATMENT   Medication. Nicotine replacement medicines such as nicotine gum and the patch are used to stop smoking. These medicines gradually lower the dosage of nicotine in the body. These medicines do not contain the carbon monoxide and other toxins found in tobacco smoke.  Hypnotherapy.  Relaxation therapy.  Nicotine Anonymous (a 12-step support program). Find times and locations in your local yellow pages. Document Released: 06/21/2004 Document Revised:  01/08/2012 Document Reviewed: 11/13/2007 [facility] Patient Information 2013 [place], [place].

## 2013-03-09 ENCOUNTER — Encounter: Payer: Self-pay | Admitting: Family Medicine

## 2013-03-09 DIAGNOSIS — M25552 Pain in left hip: Secondary | ICD-10-CM | POA: Insufficient documentation

## 2013-03-09 DIAGNOSIS — Z0000 Encounter for general adult medical examination without abnormal findings: Secondary | ICD-10-CM | POA: Insufficient documentation

## 2013-03-09 HISTORY — DX: Pain in left hip: M25.552

## 2013-03-09 NOTE — Assessment & Plan Note (Signed)
Encouraged Tylenol, Aspercreme prn consider imaging and/or referral if worsens

## 2013-03-09 NOTE — Assessment & Plan Note (Signed)
Declines colonoscopy

## 2013-03-09 NOTE — Assessment & Plan Note (Signed)
Follows with infectious disease, numbers look good

## 2013-03-09 NOTE — Assessment & Plan Note (Signed)
Patient reports Coumadin levels are stable

## 2013-03-09 NOTE — Progress Notes (Signed)
[name] [id number] 06-09-1961 03/09/2013      Progress Note-Follow Up  Subjective  Chief Complaint  Chief Complaint  Patient presents with  . Follow-up    6 month    HPI  Patient is a 52 year old AA male in today for followup. Had a bout of Normovirus 2 weeks ago with nausea and vomiting but symptoms have resolved. No other recent illness. He continues to follow the infectious disease clinic and is doing well. Denies any other recent illness. No fevers or chills, chest pain, palpitations, shortness of breath, GI or GU concerns.   Past Medical History  Diagnosis Date  . History of chicken pox   . Hyperlipidemia   . History of DVT of lower extremity     right  . Left hip pain 03/09/2013    Past Surgical History  Procedure Laterality Date  . Appendectomy  1962  . 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 Arthritis Mother   . Kidney disease Maternal Grandfather     History   Social History  . Marital Status: Married    Spouse Name: N/A    Number of Children: 3  . Years of Education: N/A   Occupational History  .     Social History Main Topics  . Smoking status: Current Every Day Smoker -- 1.00 packs/day for 36 years    Types: Cigarettes  . Smokeless tobacco: Never Used  . Alcohol Use: No  . Drug Use: No  . Sexually Active: Not Currently     Comment: declined condoms   Other Topics Concern  . Not on file   Social History Narrative  . No narrative on file    Current Outpatient Prescriptions on File Prior to Visit  Medication Sig Dispense Refill  . aspirin 81 MG tablet Take 81 mg by mouth daily.        Marland Kitchen atazanavir (REYATAZ) 300 MG capsule Take 1 capsule (300 mg total) by mouth daily with breakfast.  30 capsule  11  . ENSURE (ENSURE) Take 1 Can by mouth 3 (three) times daily between meals.  237 mL  prn  . fenofibrate (TRICOR) 145 MG tablet TAKE 1 TABLET BY MOUTH DAILY  30 tablet  6  .  gabapentin (NEURONTIN) 400 MG capsule Take 1 capsule (400 mg total) by mouth 4 (four) times daily.  120 capsule  3  . morphine (MS CONTIN) 30 MG 12 hr tablet Take 1 tablet (30 mg total) by mouth 2 (two) times daily.  60 tablet  0  . morphine (MS CONTIN) 60 MG 12 hr tablet Take 1 tablet (60 mg total) by mouth 2 (two) times daily.  60 tablet  0  . oxyCODONE (OXY IR/ROXICODONE) 5 MG immediate release tablet Take 1 tablet (5 mg total) by mouth 2 (two) times daily.  60 tablet  0  . ritonavir (NORVIR) 100 MG TABS Take 1 tablet (100 mg total) by mouth daily with breakfast.  30 tablet  11  . stavudine (ZERIT) 40 MG capsule Take 1 capsule (40 mg total) by mouth every 12 (twelve) hours.  60 capsule  11  . tenofovir ([name]) 300 MG tablet Take 1 tablet (300 mg total) by mouth daily.  30 tablet  11  . warfarin (COUMADIN) 5 MG tablet Take as directed by anticoagulation clinic provider.  90 tablet  2   No current facility-administered medications on  file prior to visit.    Allergies  Allergen Reactions  . Dapsone     REACTION: fever  . Sulfamethoxazole W-Trimethoprim     REACTION: fever    Review of Systems  Review of Systems  Constitutional: Negative for fever and malaise/fatigue.  HENT: Negative for congestion.   Eyes: Negative for discharge.  Respiratory: Negative for shortness of breath.   Cardiovascular: Negative for chest pain, palpitations and leg swelling.  Gastrointestinal: Negative for nausea, abdominal pain and diarrhea.  Genitourinary: Negative for dysuria.  Musculoskeletal: Positive for joint pain. Negative for falls.  Skin: Negative for rash.  Neurological: Negative for loss of consciousness and headaches.  Endo/Heme/Allergies: Negative for polydipsia.  Psychiatric/Behavioral: Negative for depression and suicidal ideas. The patient is not nervous/anxious and does not have insomnia.     Objective  BP 128/76  Pulse 87  Temp(Src) 98.4 F (36.9 C) (Oral)  Ht 6' (1.829 m)  Wt  139 lb (63.05 kg)  BMI 18.85 kg/m2  SpO2 97%  Physical Exam  Physical Exam  Constitutional: He is oriented to person, place, and time and well-developed, well-nourished, and in no distress. No distress.  HENT:  Head: Normocephalic and atraumatic.  Eyes: Conjunctivae are normal.  Neck: Neck supple. No thyromegaly present.  Cardiovascular: Normal rate, regular rhythm and normal heart sounds.   No murmur heard. Pulmonary/Chest: Effort normal and breath sounds normal. No respiratory distress.  Abdominal: He exhibits no distension and no mass. There is no tenderness.  Musculoskeletal: He exhibits no edema.  Neurological: He is alert and oriented to person, place, and time.  Skin: Skin is warm.  Psychiatric: Memory, affect and judgment normal.     Lab Results  Component Value Date   WBC 8.5 04/01/2012   HGB 15.2 04/01/2012   HCT 43.9 04/01/2012   MCV 109.2* 04/01/2012   PLT 308 04/01/2012   Lab Results  Component Value Date   CREATININE 1.08 04/01/2012   BUN 6 04/01/2012   NA 136 04/01/2012   K 3.5 04/01/2012   CL 99 04/01/2012   CO2 25 04/01/2012   Lab Results  Component Value Date   ALT 26 09/02/2012   AST 33 09/02/2012   ALKPHOS 86 09/02/2012   BILITOT 2.1* 09/02/2012   Lab Results  Component Value Date   CHOL 204* 09/10/2012   Lab Results  Component Value Date   HDL 35* 09/10/2012   Lab Results  Component Value Date   LDLCALC 111* 09/10/2012   Lab Results  Component Value Date   TRIG 292* 09/10/2012   Lab Results  Component Value Date   CHOLHDL 5.8 09/10/2012     Assessment & Plan  Long term current use of anticoagulant Patient reports Coumadin levels are stable  Left hip pain Encouraged Tylenol, Aspercreme prn consider imaging and/or referral if worsens  HIV DISEASE Follows with infectious disease, numbers look good  Preventative health care Declines colonoscopy

## 2013-03-17 ENCOUNTER — Ambulatory Visit (INDEPENDENT_AMBULATORY_CARE_PROVIDER_SITE_OTHER): Payer: Medicare Other | Admitting: Pharmacist

## 2013-03-17 DIAGNOSIS — Z7901 Long term (current) use of anticoagulants: Secondary | ICD-10-CM

## 2013-03-17 DIAGNOSIS — I749 Embolism and thrombosis of unspecified artery: Secondary | ICD-10-CM

## 2013-03-17 LAB — POCT INR: INR: 2.2

## 2013-03-17 MED ORDER — WARFARIN SODIUM 5 MG PO TABS: ORAL_TABLET | ORAL | Status: DC

## 2013-03-17 NOTE — Progress Notes (Signed)
Anti-Coagulation Progress Note  [name] is a 52 y.o. male who is currently on an anti-coagulation regimen.    RECENT RESULTS: Recent results are below, the most recent result is correlated with a dose of 45 mg. per week: Lab Results  Component Value Date   INR 2.20 03/17/2013   INR 2.00 02/17/2013   INR 2.30 01/20/2013   PROTIME 38.4* 04/01/2012    ANTI-COAG DOSE: Anticoagulation Dose Instructions as of 03/17/2013     [name] Tue Wed Thu Fri Sat   New Dose 7.5 mg 7.5 mg 7.5 mg 5 mg 7.5 mg 7.5 mg 7.5 mg       ANTICOAG SUMMARY: Anticoagulation Episode Summary   Current INR goal 2.0-3.0  Next INR check 04/14/2013  INR from last check 2.20 (03/17/2013)  Weekly max dose   Target end date Indefinite  [facility]  Preferred lab   Send INR reminders to [facility]   Indications  EMBOLISM AND THROMBOSIS ARTERY [444.9] Long term current use of anticoagulant [V58.61]        Comments       Anticoagulation Care Providers   Provider Role Specialty Phone number   Cliffton Asters, MD  Infectious Diseases [phone]      ANTICOAG TODAY: Anticoagulation Summary as of 03/17/2013   INR goal 2.0-3.0  Selected INR 2.20 (03/17/2013)  Next INR check 04/14/2013  Target end date Indefinite   Indications  EMBOLISM AND THROMBOSIS ARTERY [444.9] Long term current use of anticoagulant [V58.61]      Anticoagulation Episode Summary   [facility]   Preferred lab    Send INR reminders to [facility]   Comments     Anticoagulation Care Providers   Provider Role Specialty Phone number   Cliffton Asters, MD  Infectious Diseases [phone]      PATIENT INSTRUCTIONS: Patient Instructions  Patient instructed to take medications as defined in the Anti-coagulation Track section of this encounter.  Patient instructed to take today's dose.  Patient verbalized understanding of these instructions.       FOLLOW-UP Return in 4 weeks (on  04/14/2013) for Follow up INR at 2:30PM.  [name], [name] Pharm.D., CACP

## 2013-03-17 NOTE — Patient Instructions (Signed)
Patient instructed to take medications as defined in the Anti-coagulation Track section of this encounter.  Patient instructed to take today's dose.  Patient verbalized understanding of these instructions.    

## 2013-03-19 ENCOUNTER — Other Ambulatory Visit (INDEPENDENT_AMBULATORY_CARE_PROVIDER_SITE_OTHER): Payer: Medicare Other

## 2013-03-19 ENCOUNTER — Other Ambulatory Visit: Payer: Self-pay | Admitting: Internal Medicine

## 2013-03-19 DIAGNOSIS — B20 Human immunodeficiency virus [HIV] disease: Secondary | ICD-10-CM | POA: Diagnosis not present

## 2013-03-19 DIAGNOSIS — Z79899 Other long term (current) drug therapy: Secondary | ICD-10-CM

## 2013-03-19 LAB — CBC
HCT: 39.1 % (ref 39.0–52.0)
Hemoglobin: 13.7 g/dL (ref 13.0–17.0)
WBC: 7.8 10*3/uL (ref 4.0–10.5)

## 2013-03-19 LAB — LIPID PANEL
Cholesterol: 189 mg/dL (ref 0–200)
HDL: 34 mg/dL — ABNORMAL LOW (ref >39)
Triglycerides: 153 mg/dL — ABNORMAL HIGH (ref <150)

## 2013-03-19 LAB — COMPREHENSIVE METABOLIC PANEL
Albumin: 4.1 g/dL (ref 3.5–5.2)
BUN: 6 mg/dL (ref 6–23)
Calcium: 9.4 mg/dL (ref 8.4–10.5)
Chloride: 101 mEq/L (ref 96–112)
Glucose, Bld: 175 mg/dL — ABNORMAL HIGH (ref 70–99)
Potassium: 3.5 mEq/L (ref 3.5–5.3)

## 2013-03-20 LAB — HIV-1 RNA QUANT-NO REFLEX-BLD: HIV-1 RNA Quant, Log: <1.3 {Log} (ref <1.30)

## 2013-03-20 LAB — T-HELPER CELL (CD4) - (RCID CLINIC ONLY): CD4 T Cell Abs: 950 uL (ref 400–2700)

## 2013-03-26 ENCOUNTER — Other Ambulatory Visit: Payer: Self-pay | Admitting: *Deleted

## 2013-03-26 DIAGNOSIS — G629 Polyneuropathy, unspecified: Secondary | ICD-10-CM

## 2013-03-26 DIAGNOSIS — G8929 Other chronic pain: Secondary | ICD-10-CM

## 2013-03-26 MED ORDER — MORPHINE SULFATE ER 30 MG PO TBCR: 30.0000 mg | EXTENDED_RELEASE_TABLET | Freq: Two times a day (BID) | ORAL | Status: DC

## 2013-03-26 MED ORDER — OXYCODONE HCL 5 MG PO TABS: 5.0000 mg | ORAL_TABLET | Freq: Two times a day (BID) | ORAL | Status: DC

## 2013-03-26 MED ORDER — MORPHINE SULFATE ER 60 MG PO TBCR: 60.0000 mg | EXTENDED_RELEASE_TABLET | Freq: Two times a day (BID) | ORAL | Status: DC

## 2013-03-26 MED ORDER — GABAPENTIN 400 MG PO CAPS: 400.0000 mg | ORAL_CAPSULE | Freq: Four times a day (QID) | ORAL | Status: DC

## 2013-03-26 NOTE — Telephone Encounter (Signed)
Placed printed rxes in Dr. Blair Dolphin box for signature.  Call to pt.  Will pick up signed rxes Fri., May 30th in the AM.

## 2013-03-31 ENCOUNTER — Telehealth: Payer: Self-pay | Admitting: Family Medicine

## 2013-03-31 ENCOUNTER — Telehealth: Payer: Self-pay

## 2013-03-31 NOTE — Telephone Encounter (Signed)
Please advise 

## 2013-03-31 NOTE — Telephone Encounter (Signed)
Patient call stating, need note from doctor , to [employer] stating he need a first level apartment because he is unable to climb stairs due to having only one leg, has a fear of falling.

## 2013-03-31 NOTE — Telephone Encounter (Signed)
Pt is requesting a note stating he is not able to live in an upstairs apartment.  He is seeking housing.  He would like this note today.   Laurell Josephs, RN

## 2013-03-31 NOTE — Telephone Encounter (Signed)
Please supply him with a letter for the housing authority as requested. Stating due to his health conditions he requires a first floor apartment.

## 2013-04-01 ENCOUNTER — Encounter: Payer: Self-pay | Admitting: General Practice

## 2013-04-01 NOTE — Telephone Encounter (Signed)
Letter was written for pt. Called to verify where it needed to be sent and pt advised Dr. Orvan Falconer had already sent it in.

## 2013-04-08 ENCOUNTER — Ambulatory Visit (INDEPENDENT_AMBULATORY_CARE_PROVIDER_SITE_OTHER): Payer: Medicare Other | Admitting: Internal Medicine

## 2013-04-08 ENCOUNTER — Encounter: Payer: Self-pay | Admitting: Internal Medicine

## 2013-04-08 VITALS — BP 163/68 | HR 87 | Temp 98.1°F | Wt 137.0 lb

## 2013-04-08 DIAGNOSIS — G547 Phantom limb syndrome without pain: Secondary | ICD-10-CM

## 2013-04-08 DIAGNOSIS — B20 Human immunodeficiency virus [HIV] disease: Secondary | ICD-10-CM

## 2013-04-08 DIAGNOSIS — G546 Phantom limb syndrome with pain: Secondary | ICD-10-CM

## 2013-04-08 MED ORDER — MORPHINE SULFATE ER 100 MG PO TBCR: 100.0000 mg | EXTENDED_RELEASE_TABLET | Freq: Two times a day (BID) | ORAL | Status: DC

## 2013-04-08 NOTE — Progress Notes (Signed)
Patient ID: [name], male   DOB: [birth year], 52 y.o.   MRN: [id number]          [facility]  Patient Active Problem List   Diagnosis Date Noted  . Phantom limb syndrome with pain 03/21/2012    Priority: High  . Long term current use of anticoagulant 11/20/2010    Priority: High  . EMBOLISM AND THROMBOSIS, ARTERY 07/19/2009    Priority: High  . HIV DISEASE 11/10/2006    Priority: High  . DYSLIPIDEMIA 11/10/2006    Priority: High  . CIGARETTE SMOKER 11/10/2006    Priority: High  . Left hip pain 03/09/2013  . Preventative health care 03/09/2013  . History of DVT of lower extremity   . ERECTILE DYSFUNCTION, ORGANIC 04/12/2010  . GERD 08/23/2009  . METHICILLIN RESISTANT STAPHYLOCOCCUS AUREUS INFECTION 08/05/2009  . ENLARGEMENT OF LYMPH NODES 07/20/2009  . HYPOGONADISM 07/15/2009  . DEFICIENCY OF OTHER VITAMINS 07/15/2009  . PULMONARY NODULE 07/15/2009  . AKA, RIGHT, HX OF 07/15/2009  . SHINGLES, RECURRENT 11/10/2006  . HEPATITIS B 11/10/2006  . PSYCHOSIS 11/10/2006  . ALCOHOL ABUSE 11/10/2006  . PERIPHERAL NEUROPATHY 11/10/2006  . ALLERGIC RHINITIS 11/10/2006  . DENTAL CARIES 11/10/2006  . APPENDECTOMY, HX OF 11/10/2006    Patient's Medications  New Prescriptions   MORPHINE (MS CONTIN) 100 MG 12 HR TABLET    Take 1 tablet (100 mg total) by mouth 2 (two) times daily.  Previous Medications   ASPIRIN 81 MG TABLET    Take 81 mg by mouth daily.     ATAZANAVIR (REYATAZ) 300 MG CAPSULE    Take 1 capsule (300 mg total) by mouth daily with breakfast.   ENSURE (ENSURE)    Take 1 Can by mouth 3 (three) times daily between meals.   FENOFIBRATE (TRICOR) 145 MG TABLET    TAKE 1 TABLET BY MOUTH DAILY   GABAPENTIN (NEURONTIN) 400 MG CAPSULE    Take 1 capsule (400 mg total) by mouth 4 (four) times daily.   OXYCODONE (OXY IR/ROXICODONE) 5 MG IMMEDIATE RELEASE TABLET    Take 1 tablet (5 mg total) by mouth 2 (two) times daily.   RITONAVIR (NORVIR) 100 MG  TABS    Take 1 tablet (100 mg total) by mouth daily with breakfast.   STAVUDINE (ZERIT) 40 MG CAPSULE    Take 1 capsule (40 mg total) by mouth every 12 (twelve) hours.   TENOFOVIR ([name]) 300 MG TABLET    Take 1 tablet (300 mg total) by mouth daily.   WARFARIN (COUMADIN) 5 MG TABLET    Take as directed by anticoagulation clinic provider.  Modified Medications   No medications on file  Discontinued Medications   MORPHINE (MS CONTIN) 30 MG 12 HR TABLET    Take 1 tablet (30 mg total) by mouth 2 (two) times daily.   MORPHINE (MS CONTIN) 60 MG 12 HR TABLET    Take 1 tablet (60 mg total) by mouth 2 (two) times daily.    Subjective: Dudley is in for his routine followup visit. He does not know the names of his HIV medicines but is able to pick them off of the chart and describes taking them correctly. He thinks he is missed only one dose since his last visit. He prefers not to use a pill box.  He is still having phantom right leg pain. He says that he has to take his oxycodone 2 times daily, every day for breakthrough pain. He recently stumbled and  his right leg prosthesis buckled backwards. He had pain in his AKA stump but no open sores. He also had some pain in his left hip after lifting a 200 pound tow bar. Both legs are feeling better now.  He has been struggling with finances since his daughter's supplemental check was stopped. He is trying to get public housing. He is receiving supplemental food supplies from [facility]. He has been losing some weight because he does not have enough to eat. Review of Systems: Constitutional: positive for weight loss, negative for anorexia, chills, fatigue and fevers Eyes: negative Ears, nose, mouth, throat, and face: negative Respiratory: negative Cardiovascular: negative Gastrointestinal: negative Genitourinary:negative  Past Medical History  Diagnosis Date  . History of chicken pox   . Hyperlipidemia   . History of DVT of lower extremity     right  . Left  hip pain 03/09/2013  . Preventative health care 03/09/2013    History  Substance Use Topics  . Smoking status: Current Every Day Smoker -- 1.00 packs/day for 36 years    Types: Cigarettes  . Smokeless tobacco: Never Used  . Alcohol Use: No    Family History  Problem Relation Age of Onset  . Arthritis Mother   . Kidney disease Maternal Grandfather     Allergies  Allergen Reactions  . Dapsone     REACTION: fever  . Sulfamethoxazole W-Trimethoprim     REACTION: fever    Objective: Temp: 98.1 F (36.7 C) (06/10 0913) Temp src: Oral (06/10 0913) BP: 163/68 mmHg (06/10 0913) Pulse Rate: 87 (06/10 0913)  General: He is in good spirits Oral: No thrush or other oral pharyngeal lesions Skin: No rash Lungs: Clear Cor: Regular S1 and S2 no murmurs Abdomen: Soft and nontender with no palpable masses Joints and extremities: His right AKA stump appears normal Neuro: Alert and fully oriented Mood and affect normal  Lab Results HIV 1 RNA Quant (copies/mL)  Date Value  03/19/2013 <20   09/10/2012 <20   03/07/2012 <20      CD4 T Cell Abs (cmm)  Date Value  03/19/2013 950   09/10/2012 1200   03/07/2012 1070      Lab Results  Component Value Date   WBC 7.8 03/19/2013   HGB 13.7 03/19/2013   HCT 39.1 03/19/2013   MCV 99.5 03/19/2013   PLT 453* 03/19/2013   BMET    Component Value Date/Time   NA 136 03/19/2013 0917   K 3.5 03/19/2013 0917   CL 101 03/19/2013 0917   CO2 25 03/19/2013 0917   GLUCOSE 175* 03/19/2013 0917   BUN 6 03/19/2013 0917   CREATININE 1.08 03/19/2013 0917   CREATININE 1.08 04/01/2012 1105   CALCIUM 9.4 03/19/2013 0917   GFRNONAA >60 07/21/2009 0630   GFRAA  Value: >60        The eGFR has been calculated using the MDRD equation. This calculation has not been validated in all clinical situations. eGFR's persistently <60 mL/min signify possible Chronic Kidney Disease. 07/21/2009 0630   Lab Results  Component Value Date   ALT 30 03/19/2013   AST 36 03/19/2013    ALKPHOS 102 03/19/2013   BILITOT 1.4* 03/19/2013   Lab Results  Component Value Date   CHOL 189 03/19/2013   HDL 34* 03/19/2013   LDLCALC 124* 03/19/2013   TRIG 153* 03/19/2013   CHOLHDL 5.6 03/19/2013   Assessment: Hussein's HIV infection remains under excellent control. I will continue his current regimen.  He has persistent phantom  pain in his right AKA stump. I will increase his MS Contin to 100 mg twice daily to try to reduce his need for breakthrough pain medication.  He will monitor his weight at home and let me know if he is continuing to lose. I suspect that his weight loss is related to his financial situation rather than any underlying medical problems.  Plan: 1. Continue current antiretroviral regimen 2. Increase MS Contin from 90 mg twice daily to 100 mg twice daily 3. Monitor weight 4. Followup after blood work in 6 months   Cliffton Asters, MD [facility] [phone] pager   [phone] cell 04/08/2013, 9:32 AM

## 2013-04-14 ENCOUNTER — Ambulatory Visit (INDEPENDENT_AMBULATORY_CARE_PROVIDER_SITE_OTHER): Payer: Medicare Other | Admitting: Pharmacist

## 2013-04-14 DIAGNOSIS — Z7901 Long term (current) use of anticoagulants: Secondary | ICD-10-CM | POA: Diagnosis not present

## 2013-04-14 DIAGNOSIS — I749 Embolism and thrombosis of unspecified artery: Secondary | ICD-10-CM | POA: Diagnosis not present

## 2013-04-14 LAB — POCT INR: INR: 3

## 2013-04-14 NOTE — Progress Notes (Signed)
Anti-Coagulation Progress Note  [name] is a 52 y.o. male who is currently on an anti-coagulation regimen.    RECENT RESULTS: Recent results are below, the most recent result is correlated with a dose of 50 mg. per week: Lab Results  Component Value Date   INR 3.00 04/14/2013   INR 2.20 03/17/2013   INR 2.00 02/17/2013   PROTIME 38.4* 04/01/2012    ANTI-COAG DOSE: Anticoagulation Dose Instructions as of 04/14/2013     [name] Tue Wed Thu Fri Sat   New Dose 7.5 mg 5 mg 7.5 mg 7.5 mg 5 mg 7.5 mg 7.5 mg       ANTICOAG SUMMARY: Anticoagulation Episode Summary   Current INR goal 2.0-3.0  Next INR check 05/12/2013  INR from last check 3.00 (04/14/2013)  Weekly max dose   Target end date Indefinite  [facility]  Preferred lab   Send INR reminders to ANTICOAG IMP   Indications  EMBOLISM AND THROMBOSIS ARTERY [444.9] Long term current use of anticoagulant [V58.61]        Comments       Anticoagulation Care Providers   Provider Role Specialty Phone number   Cliffton Asters, MD  Infectious Diseases [phone]      ANTICOAG TODAY: Anticoagulation Summary as of 04/14/2013   INR goal 2.0-3.0  Selected INR 3.00 (04/14/2013)  Next INR check 05/12/2013  Target end date Indefinite   Indications  EMBOLISM AND THROMBOSIS ARTERY [444.9] Long term current use of anticoagulant [V58.61]      Anticoagulation Episode Summary   [facility] check [facility]   Preferred lab    Send INR reminders to ANTICOAG IMP   Comments     Anticoagulation Care Providers   Provider Role Specialty Phone number   Cliffton Asters, MD  Infectious Diseases [phone]      PATIENT INSTRUCTIONS: Patient Instructions  Patient instructed to take medications as defined in the Anti-coagulation Track section of this encounter.  Patient instructed to take today's dose.  Patient verbalized understanding of these instructions.       FOLLOW-UP Return in 4 weeks (on  05/12/2013) for Follow up INR at 2PM.  [name], [name] Pharm.D., CACP  f

## 2013-04-14 NOTE — Addendum Note (Signed)
Addended by: Jennet Maduro D on: 04/14/2013 08:35 AM   Modules accepted: Orders

## 2013-04-14 NOTE — Patient Instructions (Signed)
Patient instructed to take medications as defined in the Anti-coagulation Track section of this encounter.  Patient instructed to take today's dose.  Patient verbalized understanding of these instructions.    

## 2013-04-28 ENCOUNTER — Other Ambulatory Visit: Payer: Self-pay | Admitting: *Deleted

## 2013-04-28 DIAGNOSIS — G8929 Other chronic pain: Secondary | ICD-10-CM

## 2013-04-28 DIAGNOSIS — G546 Phantom limb syndrome with pain: Secondary | ICD-10-CM

## 2013-04-28 MED ORDER — OXYCODONE HCL 5 MG PO TABS: 5.0000 mg | ORAL_TABLET | Freq: Two times a day (BID) | ORAL | Status: DC

## 2013-05-08 NOTE — Addendum Note (Signed)
Addended by: Jennet Maduro D on: 05/08/2013 03:40 PM   Modules accepted: Orders

## 2013-05-12 ENCOUNTER — Ambulatory Visit (INDEPENDENT_AMBULATORY_CARE_PROVIDER_SITE_OTHER): Payer: Medicare Other | Admitting: Pharmacist

## 2013-05-12 ENCOUNTER — Other Ambulatory Visit: Payer: Self-pay | Admitting: Pharmacist

## 2013-05-12 DIAGNOSIS — Z7901 Long term (current) use of anticoagulants: Secondary | ICD-10-CM | POA: Diagnosis not present

## 2013-05-12 DIAGNOSIS — I749 Embolism and thrombosis of unspecified artery: Secondary | ICD-10-CM

## 2013-05-12 MED ORDER — WARFARIN SODIUM 5 MG PO TABS: ORAL_TABLET | ORAL | Status: DC

## 2013-05-12 NOTE — Progress Notes (Signed)
Anti-Coagulation Progress Note  [name] is a 52 y.o. male who is currently on an anti-coagulation regimen.    RECENT RESULTS: Recent results are below, the most recent result is correlated with a dose of 47.5 mg. per week: Lab Results  Component Value Date   INR 3.00 05/12/2013   INR 3.00 04/14/2013   INR 2.20 03/17/2013   PROTIME 38.4* 04/01/2012    ANTI-COAG DOSE: Anticoagulation Dose Instructions as of 05/12/2013     [name] Tue Wed Thu Fri Sat   New Dose 5 mg 7.5 mg 5 mg 7.5 mg 5 mg 7.5 mg 5 mg       ANTICOAG SUMMARY: Anticoagulation Episode Summary   Current INR goal 2.0-3.0  Next INR check 06/02/2013  INR from last check 3.00 (05/12/2013)  Weekly max dose   Target end date Indefinite  [facility]  Preferred lab   Send INR reminders to ANTICOAG IMP   Indications  EMBOLISM AND THROMBOSIS ARTERY [444.9] Long term current use of anticoagulant [V58.61]        Comments       Anticoagulation Care Providers   Provider Role Specialty Phone number   Cliffton Asters, MD  Infectious Diseases [phone]      ANTICOAG TODAY: Anticoagulation Summary as of 05/12/2013   INR goal 2.0-3.0  Selected INR 3.00 (05/12/2013)  Next INR check 06/02/2013  Target end date Indefinite   Indications  EMBOLISM AND THROMBOSIS ARTERY [444.9] Long term current use of anticoagulant [V58.61]      Anticoagulation Episode Summary   [facility]   Preferred lab    Send INR reminders to ANTICOAG IMP   Comments     Anticoagulation Care Providers   Provider Role Specialty Phone number   Cliffton Asters, MD  Infectious Diseases [phone]      PATIENT INSTRUCTIONS: Patient Instructions  Patient instructed to take medications as defined in the Anti-coagulation Track section of this encounter.  Patient instructed to take today's dose.  Patient verbalized understanding of these instructions.       FOLLOW-UP Return in about 5 weeks (around  06/16/2013) for Follow up INR at 2:15PM.  [name], [name] Pharm.D., CACP

## 2013-05-12 NOTE — Patient Instructions (Signed)
Patient instructed to take medications as defined in the Anti-coagulation Track section of this encounter.  Patient instructed to take today's dose.  Patient verbalized understanding of these instructions.    

## 2013-05-14 ENCOUNTER — Encounter: Payer: Self-pay | Admitting: Internal Medicine

## 2013-05-14 ENCOUNTER — Ambulatory Visit (INDEPENDENT_AMBULATORY_CARE_PROVIDER_SITE_OTHER): Payer: Medicare Other | Admitting: Internal Medicine

## 2013-05-14 ENCOUNTER — Ambulatory Visit (HOSPITAL_BASED_OUTPATIENT_CLINIC_OR_DEPARTMENT_OTHER)
Admission: RE | Admit: 2013-05-14 | Discharge: 2013-05-14 | Disposition: A | Discharge status: Home / Self Care | Payer: Medicare Other | Source: Ambulatory Visit | Attending: Internal Medicine | Admitting: Internal Medicine

## 2013-05-14 VITALS — BP 130/75 | HR 96 | Temp 98.1°F

## 2013-05-14 DIAGNOSIS — M25559 Pain in unspecified hip: Secondary | ICD-10-CM | POA: Diagnosis not present

## 2013-05-14 DIAGNOSIS — M948X9 Other specified disorders of cartilage, unspecified sites: Secondary | ICD-10-CM | POA: Insufficient documentation

## 2013-05-14 DIAGNOSIS — M25552 Pain in left hip: Secondary | ICD-10-CM

## 2013-05-14 NOTE — Assessment & Plan Note (Signed)
Ongoing left hip pain, features are mechanical. He probably has a sprain, overuse syndrome or DJD. Plan: X-ray, refer to ortho

## 2013-05-14 NOTE — Patient Instructions (Addendum)
Get the XR at [facility], corner of [address] (10 minutes form here); they are open 24/7 [address]  [place], [place] [address] [phone] --- Will refer you to orthopedic surgery

## 2013-05-14 NOTE — Progress Notes (Signed)
  Subjective:    Patient ID: [name], male    DOB: 08-21-1961, 52 y.o.   MRN: [id number]  HPI Acute visit Several weeks history of left hip pain. Pain is felt whenever the patient tries to get up from his wheelchair. Previously, he was able to walk around on his left leg, now because of the pain, he "has to have the right prosthetic leg" to be able movilize.  Past Medical History  Diagnosis Date  . History of chicken pox   . Hyperlipidemia   . History of DVT of lower extremity     right  . Left hip pain 03/09/2013  . Preventative health care 03/09/2013   Past Surgical History  Procedure Laterality Date  . Appendectomy  1962  . Leg amputation above knee  2010    right leg     Review of Systems Denies any back pain per se. No fall or injury. No fever or chills. No rash in the area.     Objective:   Physical Exam  Musculoskeletal:       Legs:  BP 130/75  Pulse 96  Temp(Src) 98.1 F (36.7 C) (Oral)  SpO2 99%\ General -- alert,  NAD.   Back-- no TTP at the lumbosacral spine   Extremities--  Right prosthetic leg Left leg: No edema, rash. Not tender to palpation at the trochanteric bursa. Range of motion of the left hip seems okay, some pain when he tries to elevate the leg. I asked the patient to stand up, he did with some difficulty, I did not ask the patient to walk. Psych-- Cognition and judgment appear intact. Alert and cooperative with normal attention span and concentration.  not anxious appearing and not depressed appearing.       Assessment & Plan:

## 2013-05-15 ENCOUNTER — Telehealth: Payer: Self-pay | Admitting: *Deleted

## 2013-05-15 NOTE — Telephone Encounter (Signed)
Discussed with patient, verbalized understanding.  

## 2013-05-15 NOTE — Telephone Encounter (Signed)
Message copied by Shirlee More I on Thu May 15, 2013 10:59 AM ------      Message from: [name]      Created: Wed May 14, 2013  4:48 PM       Advise patient,      XR did not show a fracture but definitely needs to see the orthopedic doctor. May need to get a MRI ------

## 2013-05-28 ENCOUNTER — Other Ambulatory Visit: Payer: Self-pay | Admitting: Licensed Clinical Social Worker

## 2013-05-28 DIAGNOSIS — G8929 Other chronic pain: Secondary | ICD-10-CM

## 2013-05-28 DIAGNOSIS — G546 Phantom limb syndrome with pain: Secondary | ICD-10-CM

## 2013-05-28 MED ORDER — MORPHINE SULFATE ER 100 MG PO TBCR: 100.0000 mg | EXTENDED_RELEASE_TABLET | Freq: Two times a day (BID) | ORAL | Status: DC

## 2013-05-28 MED ORDER — OXYCODONE HCL 5 MG PO TABS: 5.0000 mg | ORAL_TABLET | Freq: Two times a day (BID) | ORAL | Status: DC

## 2013-05-29 ENCOUNTER — Other Ambulatory Visit: Payer: Self-pay | Admitting: *Deleted

## 2013-05-29 DIAGNOSIS — G546 Phantom limb syndrome with pain: Secondary | ICD-10-CM

## 2013-05-29 DIAGNOSIS — G8929 Other chronic pain: Secondary | ICD-10-CM

## 2013-05-29 MED ORDER — MORPHINE SULFATE ER 100 MG PO TBCR: 100.0000 mg | EXTENDED_RELEASE_TABLET | Freq: Two times a day (BID) | ORAL | Status: DC

## 2013-05-29 MED ORDER — OXYCODONE HCL 5 MG PO TABS: 5.0000 mg | ORAL_TABLET | Freq: Two times a day (BID) | ORAL | Status: DC

## 2013-05-29 NOTE — Progress Notes (Signed)
Dr. Luciana Axe signing for Dr. Orvan Falconer as he is out of the office.

## 2013-06-02 ENCOUNTER — Ambulatory Visit (INDEPENDENT_AMBULATORY_CARE_PROVIDER_SITE_OTHER): Payer: Medicare Other | Admitting: Pharmacist

## 2013-06-02 DIAGNOSIS — I749 Embolism and thrombosis of unspecified artery: Secondary | ICD-10-CM | POA: Diagnosis not present

## 2013-06-02 DIAGNOSIS — Z7901 Long term (current) use of anticoagulants: Secondary | ICD-10-CM | POA: Diagnosis not present

## 2013-06-02 NOTE — Patient Instructions (Signed)
Patient instructed to take medications as defined in the Anti-coagulation Track section of this encounter.  Patient instructed to take today's dose.  Patient verbalized understanding of these instructions.    

## 2013-06-02 NOTE — Progress Notes (Signed)
Anti-Coagulation Progress Note  [name] is a 52 y.o. male who is currently on an anti-coagulation regimen.    RECENT RESULTS: Recent results are below, the most recent result is correlated with a dose of 42.5 mg. per week: Lab Results  Component Value Date   INR 2.60 06/02/2013   INR 3.00 05/12/2013   INR 3.00 04/14/2013   PROTIME 38.4* 04/01/2012    ANTI-COAG DOSE: Anticoagulation Dose Instructions as of 06/02/2013     [name] Tue Wed Thu Fri Sat   New Dose 5 mg 7.5 mg 5 mg 7.5 mg 5 mg 7.5 mg 5 mg       ANTICOAG SUMMARY: Anticoagulation Episode Summary   Current INR goal 2.0-3.0  Next INR check 06/23/2013  INR from last check 2.60 (06/02/2013)  Weekly max dose   Target end date Indefinite  [facility]  Preferred lab   Send INR reminders to ANTICOAG IMP   Indications  EMBOLISM AND THROMBOSIS ARTERY [444.9] Long term current use of anticoagulant [V58.61]        Comments       Anticoagulation Care Providers   Provider Role Specialty Phone number   Cliffton Asters, MD  Infectious Diseases [phone]      ANTICOAG TODAY: Anticoagulation Summary as of 06/02/2013   INR goal 2.0-3.0  Selected INR 2.60 (06/02/2013)  Next INR check 06/23/2013  Target end date Indefinite   Indications  EMBOLISM AND THROMBOSIS ARTERY [444.9] Long term current use of anticoagulant [V58.61]      Anticoagulation Episode Summary   [facility]   Preferred lab    Send INR reminders to ANTICOAG IMP   Comments     Anticoagulation Care Providers   Provider Role Specialty Phone number   Cliffton Asters, MD  Infectious Diseases [phone]      PATIENT INSTRUCTIONS: Patient Instructions  Patient instructed to take medications as defined in the Anti-coagulation Track section of this encounter.  Patient instructed to take today's dose.  Patient verbalized understanding of these instructions.       FOLLOW-UP Return in 3 weeks (on 06/23/2013) for  Follow up INR at 2PM.  [name], [name] Pharm.D., CACP

## 2013-06-23 ENCOUNTER — Ambulatory Visit (INDEPENDENT_AMBULATORY_CARE_PROVIDER_SITE_OTHER): Payer: Medicare Other | Admitting: Pharmacist

## 2013-06-23 DIAGNOSIS — I749 Embolism and thrombosis of unspecified artery: Secondary | ICD-10-CM | POA: Diagnosis not present

## 2013-06-23 DIAGNOSIS — Z7901 Long term (current) use of anticoagulants: Secondary | ICD-10-CM | POA: Diagnosis not present

## 2013-06-23 NOTE — Progress Notes (Signed)
Anti-Coagulation Progress Note  [name] is a 52 y.o. male who is currently on an anti-coagulation regimen.    RECENT RESULTS: Recent results are below, the most recent result is correlated with a dose of 42.5 mg. per week: Lab Results  Component Value Date   INR 2.60 06/23/2013   INR 2.60 06/02/2013   INR 3.00 05/12/2013   PROTIME 38.4* 04/01/2012    ANTI-COAG DOSE: Anticoagulation Dose Instructions as of 06/23/2013     [name] Tue Wed Thu Fri Sat   New Dose 5 mg 7.5 mg 5 mg 7.5 mg 5 mg 7.5 mg 5 mg       ANTICOAG SUMMARY: Anticoagulation Episode Summary   Current INR goal 2.0-3.0  Next INR check 07/21/2013  INR from last check 2.60 (06/23/2013)  Weekly max dose   Target end date Indefinite  [facility]  Preferred lab   Send INR reminders to ANTICOAG IMP   Indications  EMBOLISM AND THROMBOSIS ARTERY [444.9] Long term current use of anticoagulant [V58.61]        Comments       Anticoagulation Care Providers   Provider Role Specialty Phone number   Cliffton Asters, MD  Infectious Diseases [phone]      ANTICOAG TODAY: Anticoagulation Summary as of 06/23/2013   INR goal 2.0-3.0  Selected INR 2.60 (06/23/2013)  Next INR check 07/21/2013  Target end date Indefinite   Indications  EMBOLISM AND THROMBOSIS ARTERY [444.9] Long term current use of anticoagulant [V58.61]      Anticoagulation Episode Summary   [facility]   Preferred lab    Send INR reminders to ANTICOAG IMP   Comments     Anticoagulation Care Providers   Provider Role Specialty Phone number   Cliffton Asters, MD  Infectious Diseases [phone]      PATIENT INSTRUCTIONS: Patient Instructions  Patient instructed to take medications as defined in the Anti-coagulation Track section of this encounter.  Patient instructed to take today's dose.  Patient verbalized understanding of these instructions.       FOLLOW-UP Return in 4 weeks (on 07/21/2013)  for Follow up INR at 2:15PM.  [name], [name] Pharm.D., CACP

## 2013-06-23 NOTE — Patient Instructions (Signed)
Patient instructed to take medications as defined in the Anti-coagulation Track section of this encounter.  Patient instructed to take today's dose.  Patient verbalized understanding of these instructions.    

## 2013-06-24 ENCOUNTER — Ambulatory Visit (INDEPENDENT_AMBULATORY_CARE_PROVIDER_SITE_OTHER): Payer: Medicare Other | Admitting: Internal Medicine

## 2013-06-24 VITALS — BP 122/66 | HR 71 | Temp 98.5°F | Wt 136.2 lb

## 2013-06-24 DIAGNOSIS — F4323 Adjustment disorder with mixed anxiety and depressed mood: Secondary | ICD-10-CM

## 2013-06-24 DIAGNOSIS — B20 Human immunodeficiency virus [HIV] disease: Secondary | ICD-10-CM

## 2013-06-24 DIAGNOSIS — I10 Essential (primary) hypertension: Secondary | ICD-10-CM | POA: Diagnosis not present

## 2013-06-24 DIAGNOSIS — T50905S Adverse effect of unspecified drugs, medicaments and biological substances, sequela: Secondary | ICD-10-CM

## 2013-06-24 NOTE — Patient Instructions (Signed)
It was a pleasure to meet you today. - Continue to take the medications as prescribed. - Return to clinic in 6 months for continuity of care. - Continue to follow with Dr. Orvan Falconer.
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
## 2013-06-24 NOTE — Progress Notes (Signed)
  Subjective:    Patient ID: Manning Charity, male    DOB: 06/24/1961, 52 y.o.   MRN: [id number]  HPI  Mr. Virginia presents today to establish Primary Care in the [facility] in order to continue to be followed by Dr. Alexandria Lodge in the Anticoagulation Clinic.  He is w/o any acute complaints but notes some chronic anxiety and constipation that he does not want therapy for at this time.  He is also not interested in screening for colon cancer at this time.  Review of Systems  Constitutional: Negative for fever, chills, diaphoresis, activity change and appetite change.  Gastrointestinal: Positive for constipation. Negative for abdominal pain, diarrhea and blood in stool.  Psychiatric/Behavioral: The patient is nervous/anxious.       Objective:   Physical Exam  Nursing note and vitals reviewed. Constitutional: He is oriented to person, place, and time. He appears well-developed and well-nourished. No distress.  HENT:  Head: Normocephalic and atraumatic.  Eyes: Conjunctivae are normal. Right eye exhibits no discharge. Left eye exhibits no discharge. No scleral icterus.  Cardiovascular: Normal rate, regular rhythm and normal heart sounds.   Pulmonary/Chest: Effort normal and breath sounds normal. No respiratory distress. He has no wheezes. He has no rales.  Abdominal: Soft. He exhibits no distension. There is no tenderness. There is no rebound and no guarding.  Neurological: He is alert and oriented to person, place, and time.  Skin: Skin is warm and dry. He is not diaphoretic.  Psychiatric: He has a normal mood and affect. His behavior is normal. Judgment and thought content normal.      Assessment & Plan:   Mr. 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  He presents today to establish Primary Care in order to continue to be followed in the anticoagulation clinic by Dr. Alexandria Lodge.   He has several minor complaints as noted above which he does not want therapy for at this time.  1) HIV:  Continue the antiretrovirals as managed by Dr. Orvan Falconer  2) h/o Arterial thrombus: continue the coumadin as managed by Dr. Alexandria Lodge.  Lifelong therapy with a target INR of 2.0-3.2.  3) Health maintenance:  We discussed colon cancer screening and he stated that he was not interested at this time.  Will readdress at the follow-up visit.  Return tot he Primary Care Clinic with his assigned PCP (Dr. Sherrine Maples) in 6 months, sooner if necessary.

## 2013-06-25 ENCOUNTER — Telehealth: Payer: Self-pay | Admitting: *Deleted

## 2013-06-25 DIAGNOSIS — G546 Phantom limb syndrome with pain: Secondary | ICD-10-CM

## 2013-06-25 DIAGNOSIS — G8929 Other chronic pain: Secondary | ICD-10-CM

## 2013-06-25 MED ORDER — MORPHINE SULFATE ER 100 MG PO TBCR: 100.0000 mg | EXTENDED_RELEASE_TABLET | Freq: Two times a day (BID) | ORAL | Status: DC

## 2013-06-25 MED ORDER — OXYCODONE HCL 5 MG PO TABS: 5.0000 mg | ORAL_TABLET | Freq: Two times a day (BID) | ORAL | Status: DC

## 2013-06-25 NOTE — Telephone Encounter (Signed)
Pt requesting monthly pain medication refill.

## 2013-07-21 ENCOUNTER — Ambulatory Visit (INDEPENDENT_AMBULATORY_CARE_PROVIDER_SITE_OTHER): Payer: Medicare Other | Admitting: Pharmacist

## 2013-07-21 DIAGNOSIS — Z7901 Long term (current) use of anticoagulants: Secondary | ICD-10-CM | POA: Diagnosis not present

## 2013-07-21 DIAGNOSIS — I749 Embolism and thrombosis of unspecified artery: Secondary | ICD-10-CM

## 2013-07-21 NOTE — Patient Instructions (Signed)
Patient instructed to take medications as defined in the Anti-coagulation Track section of this encounter.  Patient instructed to take today's dose.  Patient verbalized understanding of these instructions.    

## 2013-07-21 NOTE — Progress Notes (Signed)
Anti-Coagulation Progress Note  [name] is a 52 y.o. male who is currently on an anti-coagulation regimen.    RECENT RESULTS: Recent results are below, the most recent result is correlated with a dose of 42.5 mg. per week: Lab Results  Component Value Date   INR 3.2 07/21/2013   INR 2.60 06/23/2013   INR 2.60 06/02/2013   PROTIME 38.4* 04/01/2012    ANTI-COAG DOSE: Anticoagulation Dose Instructions as of 07/21/2013     [name] Tue Wed Thu Fri Sat   New Dose 5 mg 5 mg 5 mg 7.5 mg 5 mg 7.5 mg 5 mg       ANTICOAG SUMMARY: Anticoagulation Episode Summary   Current INR goal 2.0-3.0  Next INR check 07/21/2013  INR from last check 3.2! (07/21/2013)  Weekly max dose   Target end date Indefinite  [facility]  Preferred lab   Send INR reminders to ANTICOAG IMP   Indications  EMBOLISM AND THROMBOSIS ARTERY [444.9] Long term current use of anticoagulant [V58.61]        Comments       Anticoagulation Care Providers   Provider Role Specialty Phone number   Cliffton Asters, MD  Infectious Diseases [phone]      ANTICOAG TODAY: Anticoagulation Summary as of 07/21/2013   INR goal 2.0-3.0  Selected INR 3.2! (07/21/2013)  Next INR check   Target end date Indefinite   Indications  EMBOLISM AND THROMBOSIS ARTERY [444.9] Long term current use of anticoagulant [V58.61]      Anticoagulation Episode Summary   [facility] check [facility]   Preferred lab    Send INR reminders to ANTICOAG IMP   Comments     Anticoagulation Care Providers   Provider Role Specialty Phone number   Cliffton Asters, MD  Infectious Diseases [phone]      PATIENT INSTRUCTIONS: Patient Instructions  Patient instructed to take medications as defined in the Anti-coagulation Track section of this encounter.  Patient instructed to take today's dose.  Patient verbalized understanding of these instructions.       FOLLOW-UP Return in 3 weeks (on 08/11/2013) for Follow  up INR at 2PM.  [name], [name] Pharm.D., CACP

## 2013-07-22 ENCOUNTER — Telehealth: Payer: Self-pay

## 2013-07-22 DIAGNOSIS — G546 Phantom limb syndrome with pain: Secondary | ICD-10-CM

## 2013-07-22 DIAGNOSIS — G8929 Other chronic pain: Secondary | ICD-10-CM

## 2013-07-22 MED ORDER — OXYCODONE HCL 5 MG PO TABS: 5.0000 mg | ORAL_TABLET | Freq: Two times a day (BID) | ORAL | Status: DC

## 2013-07-22 MED ORDER — MORPHINE SULFATE ER 100 MG PO TBCR: 100.0000 mg | EXTENDED_RELEASE_TABLET | Freq: Two times a day (BID) | ORAL | Status: DC

## 2013-07-22 NOTE — Telephone Encounter (Signed)
Refill

## 2013-08-11 ENCOUNTER — Ambulatory Visit (INDEPENDENT_AMBULATORY_CARE_PROVIDER_SITE_OTHER): Payer: Medicare Other | Admitting: Pharmacist

## 2013-08-11 DIAGNOSIS — Z7901 Long term (current) use of anticoagulants: Secondary | ICD-10-CM

## 2013-08-11 DIAGNOSIS — I749 Embolism and thrombosis of unspecified artery: Secondary | ICD-10-CM

## 2013-08-11 NOTE — Progress Notes (Signed)
Anti-Coagulation Progress Note  [name] is a 52 y.o. male who is currently on an anti-coagulation regimen.    RECENT RESULTS: Recent results are below, the most recent result is correlated with a dose of 40 mg. per week: Lab Results  Component Value Date   INR 2.50 08/11/2013   INR 3.2 07/21/2013   INR 2.60 06/23/2013   PROTIME 38.4* 04/01/2012    ANTI-COAG DOSE: Anticoagulation Dose Instructions as of 08/11/2013     [name] Tue Wed Thu Fri Sat   New Dose 5 mg 5 mg 5 mg 7.5 mg 5 mg 7.5 mg 5 mg       ANTICOAG SUMMARY: Anticoagulation Episode Summary   Current INR goal 2.0-3.0  Next INR check 09/08/2013  INR from last check 2.50 (08/11/2013)  Weekly max dose   Target end date Indefinite  [facility]  Preferred lab   Send INR reminders to ANTICOAG IMP   Indications  EMBOLISM AND THROMBOSIS ARTERY [444.9] Long term current use of anticoagulant [V58.61]        Comments       Anticoagulation Care Providers   Provider Role Specialty Phone number   Cliffton Asters, MD  Infectious Diseases [phone]      ANTICOAG TODAY: Anticoagulation Summary as of 08/11/2013   INR goal 2.0-3.0  Selected INR 2.50 (08/11/2013)  Next INR check 09/08/2013  Target end date Indefinite   Indications  EMBOLISM AND THROMBOSIS ARTERY [444.9] Long term current use of anticoagulant [V58.61]      Anticoagulation Episode Summary   [facility]   Preferred lab    Send INR reminders to ANTICOAG IMP   Comments     Anticoagulation Care Providers   Provider Role Specialty Phone number   Cliffton Asters, MD  Infectious Diseases [phone]      PATIENT INSTRUCTIONS: Patient Instructions  Patient instructed to take medications as defined in the Anti-coagulation Track section of this encounter.  Patient instructed to take today's dose.  Patient verbalized understanding of these instructions.       FOLLOW-UP Return in 4 weeks (on  09/08/2013) for 2:30pm Follow up INR.  [name], [name] Pharm.D., CACP

## 2013-08-11 NOTE — Patient Instructions (Signed)
Patient instructed to take medications as defined in the Anti-coagulation Track section of this encounter.  Patient instructed to take today's dose.  Patient verbalized understanding of these instructions.    

## 2013-08-12 NOTE — Progress Notes (Signed)
I have reviewed Dr. Saralyn Pilar note and plan.  [name] is on anticoagulation for arterial clot per note.

## 2013-08-25 ENCOUNTER — Other Ambulatory Visit: Payer: Self-pay | Admitting: Licensed Clinical Social Worker

## 2013-08-25 DIAGNOSIS — G8929 Other chronic pain: Secondary | ICD-10-CM

## 2013-08-25 DIAGNOSIS — G546 Phantom limb syndrome with pain: Secondary | ICD-10-CM

## 2013-08-25 DIAGNOSIS — R634 Abnormal weight loss: Secondary | ICD-10-CM

## 2013-08-25 MED ORDER — ENSURE PO LIQD: 1.0000 | Freq: Three times a day (TID) | ORAL | Status: DC

## 2013-08-25 MED ORDER — MORPHINE SULFATE ER 100 MG PO TBCR: 100.0000 mg | EXTENDED_RELEASE_TABLET | Freq: Two times a day (BID) | ORAL | Status: DC

## 2013-08-25 MED ORDER — OXYCODONE HCL 5 MG PO TABS: 5.0000 mg | ORAL_TABLET | Freq: Two times a day (BID) | ORAL | Status: DC

## 2013-08-26 ENCOUNTER — Other Ambulatory Visit: Payer: Self-pay | Admitting: *Deleted

## 2013-08-26 DIAGNOSIS — R634 Abnormal weight loss: Secondary | ICD-10-CM

## 2013-08-26 MED ORDER — ENSURE PO LIQD: 1.0000 | Freq: Three times a day (TID) | ORAL | Status: DC

## 2013-09-04 ENCOUNTER — Ambulatory Visit: Payer: Medicare Other | Admitting: Family Medicine

## 2013-09-08 ENCOUNTER — Ambulatory Visit (INDEPENDENT_AMBULATORY_CARE_PROVIDER_SITE_OTHER): Payer: Medicare Other | Admitting: Pharmacist

## 2013-09-08 DIAGNOSIS — Z7901 Long term (current) use of anticoagulants: Secondary | ICD-10-CM | POA: Diagnosis not present

## 2013-09-08 DIAGNOSIS — I749 Embolism and thrombosis of unspecified artery: Secondary | ICD-10-CM

## 2013-09-08 NOTE — Progress Notes (Signed)
Anti-Coagulation Progress Note  [name] is a 52 y.o. male who is currently on an anti-coagulation regimen.    RECENT RESULTS: Recent results are below, the most recent result is correlated with a dose of 40 mg. per week: Lab Results  Component Value Date   INR 2.30 09/08/2013   INR 2.50 08/11/2013   INR 3.2 07/21/2013   PROTIME 38.4* 04/01/2012    ANTI-COAG DOSE: Anticoagulation Dose Instructions as of 09/08/2013     [name] Tue Wed Thu Fri Sat   New Dose 5 mg 5 mg 5 mg 7.5 mg 5 mg 7.5 mg 5 mg       ANTICOAG SUMMARY: Anticoagulation Episode Summary   Current INR goal 2.0-3.0  Next INR check 10/13/2013  INR from last check 2.30 (09/08/2013)  Weekly max dose   Target end date Indefinite  [facility]  Preferred lab   Send INR reminders to ANTICOAG IMP   Indications  EMBOLISM AND THROMBOSIS ARTERY [444.9] Long term current use of anticoagulant [V58.61]        Comments       Anticoagulation Care Providers   Provider Role Specialty Phone number   Cliffton Asters, MD  Infectious Diseases [phone]      ANTICOAG TODAY: Anticoagulation Summary as of 09/08/2013   INR goal 2.0-3.0  Selected INR 2.30 (09/08/2013)  Next INR check 10/13/2013  Target end date Indefinite   Indications  EMBOLISM AND THROMBOSIS ARTERY [444.9] Long term current use of anticoagulant [V58.61]      Anticoagulation Episode Summary   [facility]   Preferred lab    Send INR reminders to ANTICOAG IMP   Comments     Anticoagulation Care Providers   Provider Role Specialty Phone number   Cliffton Asters, MD  Infectious Diseases [phone]      PATIENT INSTRUCTIONS: Patient Instructions  Patient instructed to take medications as defined in the Anti-coagulation Track section of this encounter.  Patient instructed to take today's dose.  Patient verbalized understanding of these instructions.       FOLLOW-UP Return in 5 weeks (on  10/13/2013) for Follow up INR at 2PM.  [name], [name] Pharm.D., CACP

## 2013-09-08 NOTE — Patient Instructions (Signed)
Patient instructed to take medications as defined in the Anti-coagulation Track section of this encounter.  Patient instructed to take today's dose.  Patient verbalized understanding of these instructions.    

## 2013-09-09 NOTE — Progress Notes (Signed)
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  Duration: Lifelong.  INR: At target.  Agree with Dr. Saralyn Pilar assessment and plan.

## 2013-09-22 ENCOUNTER — Other Ambulatory Visit: Payer: Self-pay | Admitting: *Deleted

## 2013-09-22 DIAGNOSIS — G546 Phantom limb syndrome with pain: Secondary | ICD-10-CM

## 2013-09-22 DIAGNOSIS — G8929 Other chronic pain: Secondary | ICD-10-CM

## 2013-09-22 MED ORDER — MORPHINE SULFATE ER 100 MG PO TBCR: 100.0000 mg | EXTENDED_RELEASE_TABLET | Freq: Two times a day (BID) | ORAL | Status: DC

## 2013-09-22 MED ORDER — OXYCODONE HCL 5 MG PO TABS: 5.0000 mg | ORAL_TABLET | Freq: Two times a day (BID) | ORAL | Status: DC

## 2013-09-24 ENCOUNTER — Other Ambulatory Visit: Payer: Self-pay | Admitting: Internal Medicine

## 2013-09-24 DIAGNOSIS — I749 Embolism and thrombosis of unspecified artery: Secondary | ICD-10-CM

## 2013-10-03 ENCOUNTER — Other Ambulatory Visit: Payer: Medicare Other

## 2013-10-03 DIAGNOSIS — B20 Human immunodeficiency virus [HIV] disease: Secondary | ICD-10-CM

## 2013-10-03 LAB — COMPREHENSIVE METABOLIC PANEL
Albumin: 4.3 g/dL (ref 3.5–5.2)
CO2: 26 mEq/L (ref 19–32)
Calcium: 9 mg/dL (ref 8.4–10.5)
Glucose, Bld: 176 mg/dL — ABNORMAL HIGH (ref 70–99)
Potassium: 4.2 mEq/L (ref 3.5–5.3)
Sodium: 135 mEq/L (ref 135–145)
Total Protein: 8 g/dL (ref 6.0–8.3)

## 2013-10-03 LAB — CBC
Platelets: 376 10*3/uL (ref 150–400)
RBC: 3.75 MIL/uL — ABNORMAL LOW (ref 4.22–5.81)
WBC: 6.8 10*3/uL (ref 4.0–10.5)

## 2013-10-03 LAB — T-HELPER CELL (CD4) - (RCID CLINIC ONLY)
CD4 % Helper T Cell: 22 % — ABNORMAL LOW (ref 33–55)
CD4 T Cell Abs: 840 /uL (ref 400–2700)

## 2013-10-05 ENCOUNTER — Other Ambulatory Visit: Payer: Self-pay | Admitting: Internal Medicine

## 2013-10-06 ENCOUNTER — Encounter: Payer: Self-pay | Admitting: Internal Medicine

## 2013-10-06 LAB — HIV-1 RNA QUANT-NO REFLEX-BLD
HIV 1 RNA Quant: <20 copies/mL (ref <20)
HIV-1 RNA Quant, Log: <1.3 {Log} (ref <1.30)

## 2013-10-13 ENCOUNTER — Ambulatory Visit (INDEPENDENT_AMBULATORY_CARE_PROVIDER_SITE_OTHER): Payer: Medicare Other | Admitting: Pharmacist

## 2013-10-13 DIAGNOSIS — I749 Embolism and thrombosis of unspecified artery: Secondary | ICD-10-CM | POA: Diagnosis not present

## 2013-10-13 DIAGNOSIS — Z7901 Long term (current) use of anticoagulants: Secondary | ICD-10-CM

## 2013-10-13 NOTE — Patient Instructions (Signed)
Patient instructed to take medications as defined in the Anti-coagulation Track section of this encounter.  Patient instructed to take today's dose.  Patient verbalized understanding of these instructions.    

## 2013-10-13 NOTE — Progress Notes (Signed)
Anti-Coagulation Progress Note  [name] is a 52 y.o. male who is currently on an anti-coagulation regimen.    RECENT RESULTS: Recent results are below, the most recent result is correlated with a dose of 40 mg. per week: Lab Results  Component Value Date   INR 2.50 10/13/2013   INR 2.30 09/08/2013   INR 2.50 08/11/2013   PROTIME 38.4* 04/01/2012    ANTI-COAG DOSE: Anticoagulation Dose Instructions as of 10/13/2013     [name] Tue Wed Thu Fri Sat   New Dose 5 mg 5 mg 5 mg 7.5 mg 5 mg 7.5 mg 5 mg       ANTICOAG SUMMARY: Anticoagulation Episode Summary   Current INR goal 2.0-3.0  Next INR check 11/03/2013  INR from last check 2.50 (10/13/2013)  Weekly max dose   Target end date Indefinite  [facility]  Preferred lab   Send INR reminders to ANTICOAG IMP   Indications  EMBOLISM AND THROMBOSIS ARTERY [444.9] Long term current use of anticoagulant [V58.61]        Comments       Anticoagulation Care Providers   Provider Role Specialty Phone number   Cliffton Asters, MD  Infectious Diseases [phone]      ANTICOAG TODAY: Anticoagulation Summary as of 10/13/2013   INR goal 2.0-3.0  Selected INR 2.50 (10/13/2013)  Next INR check 11/03/2013  Target end date Indefinite   Indications  EMBOLISM AND THROMBOSIS ARTERY [444.9] Long term current use of anticoagulant [V58.61]      Anticoagulation Episode Summary   IN[facility] check location [facility]   Preferred lab    Send INR reminders to ANTICOAG IMP   Comments     Anticoagulation Care Providers   Provider Role Specialty Phone number   Cliffton Asters, MD  Infectious Diseases [phone]      PATIENT INSTRUCTIONS: Patient Instructions  Patient instructed to take medications as defined in the Anti-coagulation Track section of this encounter.  Patient instructed to take today's dose.  Patient verbalized understanding of these instructions.       FOLLOW-UP Return in 3 weeks (on 11/03/2013)  for Follow up INR at 2:30PM.  [name], [name] Pharm.D., CACP

## 2013-10-14 ENCOUNTER — Other Ambulatory Visit: Payer: Medicare Other

## 2013-10-21 ENCOUNTER — Other Ambulatory Visit: Payer: Self-pay | Admitting: Licensed Clinical Social Worker

## 2013-10-21 ENCOUNTER — Other Ambulatory Visit: Payer: Self-pay | Admitting: Internal Medicine

## 2013-10-21 DIAGNOSIS — G546 Phantom limb syndrome with pain: Secondary | ICD-10-CM

## 2013-10-21 DIAGNOSIS — G8929 Other chronic pain: Secondary | ICD-10-CM

## 2013-10-21 MED ORDER — MORPHINE SULFATE ER 100 MG PO TBCR: 100.0000 mg | EXTENDED_RELEASE_TABLET | Freq: Two times a day (BID) | ORAL | Status: DC

## 2013-10-21 MED ORDER — OXYCODONE HCL 5 MG PO TABS: 5.0000 mg | ORAL_TABLET | Freq: Two times a day (BID) | ORAL | Status: DC

## 2013-10-28 ENCOUNTER — Ambulatory Visit: Payer: Medicare Other | Admitting: Internal Medicine

## 2013-11-03 ENCOUNTER — Ambulatory Visit (INDEPENDENT_AMBULATORY_CARE_PROVIDER_SITE_OTHER): Payer: Medicare Other | Admitting: Pharmacist

## 2013-11-03 DIAGNOSIS — I749 Embolism and thrombosis of unspecified artery: Secondary | ICD-10-CM | POA: Diagnosis not present

## 2013-11-03 DIAGNOSIS — Z7901 Long term (current) use of anticoagulants: Secondary | ICD-10-CM

## 2013-11-03 LAB — POCT INR: INR: 2.4

## 2013-11-04 ENCOUNTER — Ambulatory Visit (INDEPENDENT_AMBULATORY_CARE_PROVIDER_SITE_OTHER): Payer: Medicare Other | Admitting: Internal Medicine

## 2013-11-04 ENCOUNTER — Encounter: Payer: Self-pay | Admitting: Internal Medicine

## 2013-11-04 VITALS — BP 159/71 | HR 84 | Temp 97.4°F | Wt 134.0 lb

## 2013-11-04 DIAGNOSIS — B20 Human immunodeficiency virus [HIV] disease: Secondary | ICD-10-CM | POA: Diagnosis not present

## 2013-11-04 DIAGNOSIS — Z23 Encounter for immunization: Secondary | ICD-10-CM

## 2013-11-04 DIAGNOSIS — R634 Abnormal weight loss: Secondary | ICD-10-CM

## 2013-11-04 DIAGNOSIS — Z79899 Other long term (current) drug therapy: Secondary | ICD-10-CM | POA: Diagnosis not present

## 2013-11-04 MED ORDER — DOLUTEGRAVIR SODIUM 50 MG PO TABS: 50.0000 mg | ORAL_TABLET | Freq: Every day | ORAL | Status: DC

## 2013-11-04 NOTE — Progress Notes (Signed)
Anti-Coagulation Progress Note  [name] is a 53 y.o. male who is currently on an anti-coagulation regimen.    RECENT RESULTS: Recent results are below, the most recent result is correlated with a dose of 40 mg. per week: Lab Results  Component Value Date   INR 2.40 11/03/2013   INR 2.50 10/13/2013   INR 2.30 09/08/2013   PROTIME 38.4* 04/01/2012    ANTI-COAG DOSE: Anticoagulation Dose Instructions as of 11/03/2013     [name] Tue Wed Thu Fri Sat   New Dose 5 mg 5 mg 5 mg 7.5 mg 5 mg 5 mg 5 mg       ANTICOAG SUMMARY: Anticoagulation Episode Summary   Current INR goal 2.0-3.0  Next INR check 11/24/2013  INR from last check 2.40 (11/03/2013)  Weekly max dose   Target end date Indefinite  [facility]  Preferred lab   Send INR reminders to ANTICOAG IMP   Indications  EMBOLISM AND THROMBOSIS ARTERY [444.9] Long term current use of anticoagulant [V58.61]        Comments       Anticoagulation Care Providers   Provider Role Specialty Phone number   Michel Bickers, MD  Infectious Diseases [phone]      ANTICOAG TODAY: Anticoagulation Summary as of 11/03/2013   INR goal 2.0-3.0  Selected INR 2.40 (11/03/2013)  Next INR check 11/24/2013  Target end date Indefinite   Indications  EMBOLISM AND THROMBOSIS ARTERY [444.9] Long term current use of anticoagulant [V58.61]      Anticoagulation Episode Summary   INR check location [facility]   Preferred lab    Send INR reminders to ANTICOAG IMP   Comments     Anticoagulation Care Providers   Provider Role Specialty Phone number   Michel Bickers, MD  Infectious Diseases [phone]      PATIENT INSTRUCTIONS: Patient Instructions  Patient instructed to take medications as defined in the Anti-coagulation Track section of this encounter.  Patient instructed to take today's dose.  Patient verbalized understanding of these instructions.       FOLLOW-UP Return in 3 weeks (on 11/24/2013) for  Follow up INR at 2:15PM.  Jorene Guest, [name] Pharm.D., CACP

## 2013-11-04 NOTE — Progress Notes (Signed)
Patient ID: [name], male   DOB: 19-Jan-1961, 53 y.o.   MRN: [id number]          [facility]ious [facility]  Patient Active Problem List   Diagnosis Date Noted  . Phantom limb syndrome with pain 03/21/2012    Priority: High  . Long term current use of anticoagulant 11/20/2010    Priority: High  . EMBOLISM AND THROMBOSIS, ARTERY 07/19/2009    Priority: High  . HIV DISEASE 11/10/2006    Priority: High  . DYSLIPIDEMIA 11/10/2006    Priority: High  . CIGARETTE SMOKER 11/10/2006    Priority: High  . Unintentional weight loss 11/04/2013    Priority: Medium  . Left hip pain 03/09/2013  . Preventative health care 03/09/2013  . History of DVT of lower extremity   . ERECTILE DYSFUNCTION, ORGANIC 04/12/2010  . GERD 08/23/2009  . METHICILLIN RESISTANT STAPHYLOCOCCUS AUREUS INFECTION 08/05/2009  . ENLARGEMENT OF LYMPH NODES 07/20/2009  . HYPOGONADISM 07/15/2009  . DEFICIENCY OF OTHER VITAMINS 07/15/2009  . PULMONARY NODULE 07/15/2009  . AKA, RIGHT, HX OF 07/15/2009  . SHINGLES, RECURRENT 11/10/2006  . HEPATITIS B 11/10/2006  . PSYCHOSIS 11/10/2006  . ALCOHOL ABUSE 11/10/2006  . PERIPHERAL NEUROPATHY 11/10/2006  . ALLERGIC RHINITIS 11/10/2006  . DENTAL CARIES 11/10/2006  . APPENDECTOMY, HX OF 11/10/2006    Patient's Medications  New Prescriptions   DOLUTEGRAVIR ([name]) 50 MG TABLET    Take 1 tablet (50 mg total) by mouth daily.  Previous Medications   ASPIRIN 81 MG TABLET    Take 81 mg by mouth daily.     ATAZANAVIR (REYATAZ) 300 MG CAPSULE    Take 1 capsule (300 mg total) by mouth daily with breakfast.   ENSURE (ENSURE)    Take 1 Can by mouth 3 (three) times daily between meals.   FENOFIBRATE (TRICOR) 145 MG TABLET    TAKE 1 TABLET BY MOUTH DAILY   GABAPENTIN (NEURONTIN) 400 MG CAPSULE    TAKE ONE CAPSULE BY MOUTH FOUR TIMES DAILY   MORPHINE (MS CONTIN) 100 MG 12 HR TABLET    Take 1 tablet (100 mg total) by mouth 2 (two) times daily.   OXYCODONE (OXY  IR/ROXICODONE) 5 MG IMMEDIATE RELEASE TABLET    Take 1 tablet (5 mg total) by mouth 2 (two) times daily.   RITONAVIR (NORVIR) 100 MG TABS    Take 1 tablet (100 mg total) by mouth daily with breakfast.   TENOFOVIR ([name]) 300 MG TABLET    Take 1 tablet (300 mg total) by mouth daily.   WARFARIN (COUMADIN) 5 MG TABLET    Take as directed by anticoagulation clinic provider.  Modified Medications   No medications on file  Discontinued Medications   STAVUDINE (ZERIT) 40 MG CAPSULE    Take 1 capsule (40 mg total) by mouth every 12 (twelve) hours.   WARFARIN (COUMADIN) 5 MG TABLET    TAKE AS DIRECTED BY ANTICOAGULATION CLINIC PROVIDER.    Subjective: Jamason is in for his routine visit today. As usual he never misses any doses of his antiretroviral regimen. He has been having trouble obtaining enough food and he has been losing weight. He has been receiving food stamps but prefers to give more food to his daughter then to himself. He hopes this will improve this month choose food stamp allowance will increase. He is only been getting enough Ensure each month from [facility] to last 2 weeks. He would like to quit smoking cigarettes because of the expense.  He has been using a knee cigarette on occasion but has not thought about setting a quit date. Has not had any change in his phantom leg pain related to his previous AKA.  Review of Systems: Constitutional: positive for weight loss, negative for anorexia, chills, fatigue, fevers and sweats Eyes: negative Ears, nose, mouth, throat, and face: negative Respiratory: negative Cardiovascular: negative Gastrointestinal: negative Genitourinary:negative  Past Medical History  Diagnosis Date  . History of chicken pox   . Hyperlipidemia   . History of DVT of lower extremity     right  . Left hip pain 03/09/2013  . Preventative health care 03/09/2013    History  Substance Use Topics  . Smoking status: Current Every Day Smoker -- 1.00 packs/day for 36 years     Types: Cigarettes  . Smokeless tobacco: Never Used  . Alcohol Use: No    Family History  Problem Relation Age of Onset  . Arthritis Mother   . Kidney disease Maternal Grandfather     Allergies  Allergen Reactions  . Dapsone     REACTION: fever  . Sulfamethoxazole-Trimethoprim     REACTION: fever    Objective: Temp: 97.4 F (36.3 C) (01/06 0841) Temp src: Oral (01/06 0841) BP: 159/71 mmHg (01/06 0841) Pulse Rate: 84 (01/06 0841)  General: His weight is down 10 pounds over the past 14 months to 134 Oral: No oropharyngeal lesions Skin: No rash Lungs: Clear Cor: Regular S1 and S2 with no murmur Abdomen: Soft nontender Joints and extremities: No acute abnormalities Neuro: Alert with normal speech and conversation Mood and affect normal  Lab Results Lab Results  Component Value Date   WBC 6.8 10/03/2013   HGB 13.8 10/03/2013   HCT 39.2 10/03/2013   MCV 104.5* 10/03/2013   PLT 376 10/03/2013    Lab Results  Component Value Date   CREATININE 1.25 10/03/2013   BUN 11 10/03/2013   NA 135 10/03/2013   K 4.2 10/03/2013   CL 102 10/03/2013   CO2 26 10/03/2013    Lab Results  Component Value Date   ALT 15 10/03/2013   AST 24 10/03/2013   ALKPHOS 83 10/03/2013   BILITOT 1.7* 10/03/2013    Lab Results  Component Value Date   CHOL 189 03/19/2013   HDL 34* 03/19/2013   LDLCALC 124* 03/19/2013   TRIG 153* 03/19/2013   CHOLHDL 5.6 03/19/2013    Lab Results HIV 1 RNA Quant (copies/mL)  Date Value  10/03/2013 <20   03/19/2013 <20   09/10/2012 <20      CD4 T Cell Abs (/uL)  Date Value  10/03/2013 840   03/19/2013 950   09/10/2012 1200    Lab Results  Component Value Date   WBC 6.8 10/03/2013   HGB 13.8 10/03/2013   HCT 39.2 10/03/2013   MCV 104.5* 10/03/2013   PLT 376 10/03/2013   BMET    Component Value Date/Time   NA 135 10/03/2013 0914   K 4.2 10/03/2013 0914   CL 102 10/03/2013 0914   CO2 26 10/03/2013 0914   GLUCOSE 176* 10/03/2013 0914   BUN 11 10/03/2013 0914    CREATININE 1.25 10/03/2013 0914   CREATININE 1.08 04/01/2012 1105   CALCIUM 9.0 10/03/2013 0914   GFRNONAA >60 07/21/2009 0630   GFRAA  Value: >60        The eGFR has been calculated using the MDRD equation. This calculation has not been validated in all clinical situations. eGFR's persistently <60 mL/min signify possible  Chronic Kidney Disease. 07/21/2009 0630   Lab Results  Component Value Date   ALT 15 10/03/2013   AST 24 10/03/2013   ALKPHOS 83 10/03/2013   BILITOT 1.7* 10/03/2013   Lab Results  Component Value Date   CHOL 189 03/19/2013   HDL 34* 03/19/2013   LDLCALC 124* 03/19/2013   TRIG 153* 03/19/2013   CHOLHDL 5.6 03/19/2013     Assessment: His HIV infection remains under excellent control. I reviewed past genotypes and is quite extensive NNRTI resistance. I will improve and simplify his regimen by changing Zerit to [name] and continue his other medications.  Encouraged him to quit smoking cigarettes completely.  I will continue his current pain regimen.  He will receive his influenza vaccine today  He also has dyslipidemia and some elevation of his blood pressure today. He has a lot of fried food. I talked to him about lifestyle modification.  Plan: 1. Changing Zerit to [name] him continue [name], Reyataz and Norvir 2. Influenza vaccine today 3. Cigarette cessation and lifestyle modification counseling provided 4. Followup after lab work in 3 months   Michel Bickers, MD [facility] for [place] [phone] pager   [phone] cell 11/04/2013, 9:01 AM

## 2013-11-04 NOTE — Patient Instructions (Signed)
Patient instructed to take medications as defined in the Anti-coagulation Track section of this encounter.  Patient instructed to take today's dose.  Patient verbalized understanding of these instructions.    

## 2013-11-13 ENCOUNTER — Other Ambulatory Visit: Payer: Self-pay | Admitting: Internal Medicine

## 2013-11-14 ENCOUNTER — Other Ambulatory Visit: Payer: Self-pay | Admitting: *Deleted

## 2013-11-14 DIAGNOSIS — B20 Human immunodeficiency virus [HIV] disease: Secondary | ICD-10-CM

## 2013-11-14 MED ORDER — DOLUTEGRAVIR SODIUM 50 MG PO TABS: 50.0000 mg | ORAL_TABLET | Freq: Every day | ORAL | Status: DC

## 2013-11-21 ENCOUNTER — Other Ambulatory Visit: Payer: Self-pay | Admitting: *Deleted

## 2013-11-21 DIAGNOSIS — G546 Phantom limb syndrome with pain: Secondary | ICD-10-CM

## 2013-11-21 DIAGNOSIS — G8929 Other chronic pain: Secondary | ICD-10-CM

## 2013-11-21 MED ORDER — MORPHINE SULFATE ER 100 MG PO TBCR: 100.0000 mg | EXTENDED_RELEASE_TABLET | Freq: Two times a day (BID) | ORAL | Status: DC

## 2013-11-21 MED ORDER — OXYCODONE HCL 5 MG PO TABS: 5.0000 mg | ORAL_TABLET | Freq: Two times a day (BID) | ORAL | Status: DC

## 2013-11-24 ENCOUNTER — Ambulatory Visit (INDEPENDENT_AMBULATORY_CARE_PROVIDER_SITE_OTHER): Payer: Medicare Other | Admitting: Pharmacist

## 2013-11-24 DIAGNOSIS — I749 Embolism and thrombosis of unspecified artery: Secondary | ICD-10-CM

## 2013-11-24 DIAGNOSIS — Z7901 Long term (current) use of anticoagulants: Secondary | ICD-10-CM | POA: Diagnosis not present

## 2013-11-24 LAB — POCT INR: INR: 2.2

## 2013-11-25 NOTE — Patient Instructions (Signed)
Patient instructed to take medications as defined in the Anti-coagulation Track section of this encounter.  Patient instructed to take today's dose.  Patient verbalized understanding of these instructions.    

## 2013-11-25 NOTE — Progress Notes (Signed)
Anti-Coagulation Progress Note  [name] is a 53 y.o. male who is currently on an anti-coagulation regimen.    RECENT RESULTS: Recent results are below, the most recent result is correlated with a dose of 40 mg. per week: Lab Results  Component Value Date   INR 2.20 11/24/2013   INR 2.40 11/03/2013   INR 2.50 10/13/2013   PROTIME 38.4* 04/01/2012    ANTI-COAG DOSE: Anticoagulation Dose Instructions as of 11/24/2013     [name] Tue Wed Thu Fri Sat   New Dose 5 mg 5 mg 5 mg 7.5 mg 5 mg 5 mg 5 mg       ANTICOAG SUMMARY: Anticoagulation Episode Summary   Current INR goal 2.0-3.0  Next INR check 12/22/2013  INR from last check 2.20 (11/24/2013)  Weekly max dose   Target end date Indefinite  [facility]  Preferred lab   Send INR reminders to ANTICOAG IMP   Indications  EMBOLISM AND THROMBOSIS ARTERY [444.9] Long term current use of anticoagulant [V58.61]        Comments       Anticoagulation Care Providers   Provider Role Specialty Phone number   Michel Bickers, MD  Infectious Diseases [phone]      ANTICOAG TODAY: Anticoagulation Summary as of 11/24/2013   INR goal 2.0-3.0  Selected INR 2.20 (11/24/2013)  Next INR check 12/22/2013  Target end date Indefinite   Indications  EMBOLISM AND THROMBOSIS ARTERY [444.9] Long term current use of anticoagulant [V58.61]      Anticoagulation Episode Summary   [facility] location [facility]   Preferred lab    Send INR reminders to ANTICOAG IMP   Comments     Anticoagulation Care Providers   Provider Role Specialty Phone number   Michel Bickers, MD  Infectious Diseases [phone]      PATIENT INSTRUCTIONS: Patient Instructions  Patient instructed to take medications as defined in the Anti-coagulation Track section of this encounter.  Patient instructed to take today's dose.  Patient verbalized understanding of these instructions.       FOLLOW-UP Return in 4 weeks (on 12/22/2013) for  Follow up INR at [place], [name] Pharm.D., CACP

## 2013-11-26 ENCOUNTER — Other Ambulatory Visit: Payer: Self-pay | Admitting: Licensed Clinical Social Worker

## 2013-11-26 DIAGNOSIS — B20 Human immunodeficiency virus [HIV] disease: Secondary | ICD-10-CM

## 2013-11-26 MED ORDER — RITONAVIR 100 MG PO TABS: 100.0000 mg | ORAL_TABLET | Freq: Every day | ORAL | Status: DC

## 2013-11-26 MED ORDER — DOLUTEGRAVIR SODIUM 50 MG PO TABS: 50.0000 mg | ORAL_TABLET | Freq: Every day | ORAL | Status: DC

## 2013-11-26 MED ORDER — TENOFOVIR DISOPROXIL FUMARATE 300 MG PO TABS: 300.0000 mg | ORAL_TABLET | Freq: Every day | ORAL | Status: DC

## 2013-11-26 MED ORDER — ATAZANAVIR SULFATE 300 MG PO CAPS: 300.0000 mg | ORAL_CAPSULE | Freq: Every day | ORAL | Status: DC

## 2013-12-05 ENCOUNTER — Other Ambulatory Visit: Payer: Self-pay | Admitting: Internal Medicine

## 2013-12-17 ENCOUNTER — Other Ambulatory Visit: Payer: Self-pay | Admitting: Internal Medicine

## 2013-12-22 ENCOUNTER — Ambulatory Visit (INDEPENDENT_AMBULATORY_CARE_PROVIDER_SITE_OTHER): Payer: Medicare Other | Admitting: Pharmacist

## 2013-12-22 DIAGNOSIS — R17 Unspecified jaundice: Secondary | ICD-10-CM | POA: Diagnosis not present

## 2013-12-22 DIAGNOSIS — R7309 Other abnormal glucose: Secondary | ICD-10-CM | POA: Diagnosis not present

## 2013-12-22 DIAGNOSIS — Z0000 Encounter for general adult medical examination without abnormal findings: Secondary | ICD-10-CM | POA: Diagnosis not present

## 2013-12-22 DIAGNOSIS — F17200 Nicotine dependence, unspecified, uncomplicated: Secondary | ICD-10-CM | POA: Diagnosis not present

## 2013-12-22 DIAGNOSIS — I749 Embolism and thrombosis of unspecified artery: Secondary | ICD-10-CM | POA: Diagnosis not present

## 2013-12-22 DIAGNOSIS — Z7901 Long term (current) use of anticoagulants: Secondary | ICD-10-CM

## 2013-12-22 DIAGNOSIS — R634 Abnormal weight loss: Secondary | ICD-10-CM | POA: Diagnosis not present

## 2013-12-22 DIAGNOSIS — B20 Human immunodeficiency virus [HIV] disease: Secondary | ICD-10-CM | POA: Diagnosis not present

## 2013-12-22 LAB — POCT INR: INR: 3.2

## 2013-12-22 NOTE — Progress Notes (Signed)
Anti-Coagulation Progress Note  [name] is a 53 y.o. male who is currently on an anti-coagulation regimen.    RECENT RESULTS: Recent results are below, the most recent result is correlated with a dose of 40 mg. per week: Lab Results  Component Value Date   INR 3.20 12/22/2013   INR 2.20 11/24/2013   INR 2.40 11/03/2013   PROTIME 38.4* 04/01/2012    ANTI-COAG DOSE: Anticoagulation Dose Instructions as of 12/22/2013     [name] Tue Wed Thu Fri Sat   New Dose 5 mg 5 mg 5 mg 7.5 mg 5 mg 5 mg 5 mg       ANTICOAG SUMMARY: Anticoagulation Episode Summary   Current INR goal 2.0-3.0  Next INR check 01/12/2014  INR from last check 3.20! (12/22/2013)  Weekly max dose   Target end date Indefinite  [facility]  Preferred lab   Send INR reminders to ANTICOAG IMP   Indications  EMBOLISM AND THROMBOSIS ARTERY [444.9] Long term current use of anticoagulant [V58.61]        Comments       Anticoagulation Care Providers   Provider Role Specialty Phone number   Michel Bickers, MD  Infectious Diseases [phone]      ANTICOAG TODAY: Anticoagulation Summary as of 12/22/2013   INR goal 2.0-3.0  Selected INR 3.20! (12/22/2013)  Next INR check 01/12/2014  Target end date Indefinite   Indications  EMBOLISM AND THROMBOSIS ARTERY [444.9] Long term current use of anticoagulant [V58.61]      Anticoagulation Episode Summary   [facility]   Preferred lab    Send INR reminders to ANTICOAG IMP   Comments     Anticoagulation Care Providers   Provider Role Specialty Phone number   Michel Bickers, MD  Infectious Diseases [phone]      PATIENT INSTRUCTIONS: Patient Instructions  Patient instructed to take medications as defined in the Anti-coagulation Track section of this encounter.  Patient instructed to take today's dose.  Patient verbalized understanding of these instructions.       FOLLOW-UP Return in 3 weeks (on 01/12/2014) for  Follow up INR at 2:15PM.  Jorene Guest, [name] Pharm.D., CACP

## 2013-12-22 NOTE — Patient Instructions (Signed)
Patient instructed to take medications as defined in the Anti-coagulation Track section of this encounter.  Patient instructed to take today's dose.  Patient verbalized understanding of these instructions.    

## 2013-12-23 ENCOUNTER — Other Ambulatory Visit: Payer: Self-pay | Admitting: *Deleted

## 2013-12-23 DIAGNOSIS — G8929 Other chronic pain: Secondary | ICD-10-CM

## 2013-12-23 DIAGNOSIS — G546 Phantom limb syndrome with pain: Secondary | ICD-10-CM

## 2013-12-23 MED ORDER — MORPHINE SULFATE ER 100 MG PO TBCR: 100.0000 mg | EXTENDED_RELEASE_TABLET | Freq: Two times a day (BID) | ORAL | Status: DC

## 2013-12-23 MED ORDER — OXYCODONE HCL 5 MG PO TABS: 5.0000 mg | ORAL_TABLET | Freq: Two times a day (BID) | ORAL | Status: DC

## 2013-12-25 ENCOUNTER — Encounter: Payer: Medicare Other | Admitting: Internal Medicine

## 2013-12-29 ENCOUNTER — Ambulatory Visit (INDEPENDENT_AMBULATORY_CARE_PROVIDER_SITE_OTHER): Payer: Medicare Other | Admitting: Internal Medicine

## 2013-12-29 ENCOUNTER — Encounter: Payer: Self-pay | Admitting: Internal Medicine

## 2013-12-29 VITALS — BP 121/72 | HR 84 | Temp 99.0°F | Wt 137.8 lb

## 2013-12-29 DIAGNOSIS — R634 Abnormal weight loss: Secondary | ICD-10-CM | POA: Diagnosis not present

## 2013-12-29 DIAGNOSIS — I749 Embolism and thrombosis of unspecified artery: Secondary | ICD-10-CM

## 2013-12-29 DIAGNOSIS — Z7901 Long term (current) use of anticoagulants: Secondary | ICD-10-CM | POA: Diagnosis not present

## 2013-12-29 DIAGNOSIS — R7309 Other abnormal glucose: Secondary | ICD-10-CM | POA: Diagnosis not present

## 2013-12-29 DIAGNOSIS — B20 Human immunodeficiency virus [HIV] disease: Secondary | ICD-10-CM | POA: Diagnosis not present

## 2013-12-29 DIAGNOSIS — Z0000 Encounter for general adult medical examination without abnormal findings: Secondary | ICD-10-CM | POA: Diagnosis not present

## 2013-12-29 DIAGNOSIS — R739 Hyperglycemia, unspecified: Secondary | ICD-10-CM

## 2013-12-29 DIAGNOSIS — R17 Unspecified jaundice: Secondary | ICD-10-CM

## 2013-12-29 DIAGNOSIS — F17200 Nicotine dependence, unspecified, uncomplicated: Secondary | ICD-10-CM

## 2013-12-29 LAB — GLUCOSE, CAPILLARY: GLUCOSE-CAPILLARY: 138 mg/dL — AB (ref 70–99)

## 2013-12-29 LAB — POCT GLYCOSYLATED HEMOGLOBIN (HGB A1C): Hemoglobin A1C: 6.1

## 2013-12-29 NOTE — Assessment & Plan Note (Addendum)
Pt w/ h/o BLE DVT resulting in RLE ischemia and subsequent AKA. No LLE edema or pain on exam today. Pt compliant with Coumadin. Last INR 2/23 was slightly supratherapeutic at 3.2. He is due in the Coumadin clinic on 3/16. Continuing Coumadin per Dr. Elie Confer.

## 2013-12-29 NOTE — Assessment & Plan Note (Signed)
  Assessment: Progress toward smoking cessation:  smoking the same amount Barriers to progress toward smoking cessation:  other (see comments) (Pt states he would smoke less if he has more to do.) Comments: Pt not ready to quit yet   Plan: Instruction/counseling given:  I counseled patient on the dangers of tobacco use, advised patient to stop smoking, and reviewed strategies to maximize success. Educational resources provided:  [employer] account manager) brochure Self management tools provided:  smoking cessation plan (STAR Quit Plan)  Patient agreed to the following self-care plans for smoking cessation: go to the [employer] (www.quitlinenc.com);cut down the number of cigarettes smoked

## 2013-12-29 NOTE — Patient Instructions (Signed)
**  Try to cut back on your soda and candy intake.   **Come back to the clinic in 3 months

## 2013-12-29 NOTE — Progress Notes (Signed)
Patient ID: [name], male   DOB: [birth year], 53 y.o.   MRN: [id number]  Subjective:   Patient ID: [name] male   DOB: [birth year] 53 y.o.   MRN: [id number]  HPI: [name] is a 53 y.o. M w/  PMH HIV (CD4 840 w/ VL <20 on 10/03/14), h/o DVT on life-long anticoagulation, and GERD presents to establish care in the clinic regarding his chronic medical conditions.   He has been trouble affording enough food and as a result was losing weight. His weight today is up 3lbs from when he was seen in the ID clinic in January. He has been drinking Ensure shakes, 3-4x day, which is provided to him from [employer]. He states that they give him 2 cases of shakes a month, but he goes through them in 2 weeks and is not allowed any more through the program. He does not want to decrease his shake consumption to 2 shakes a day, which would allow him to have the shakes throughout the month.   He has a h/o DVT and is on lif long coumadin and is followed by Dr. Elie [name]. He endorses compliance with his Coumadin and his last INR was 3.2 on 2/23. He is to return to see Dr. Elie [name] on 3/16. He is still smoking about 1 ppd.    Past Medical History  Diagnosis Date  . History of chicken pox   . Hyperlipidemia   . History of DVT of lower extremity     right  . Left hip pain 03/09/2013  . Preventative health care 03/09/2013   Current Outpatient Prescriptions  Medication Sig Dispense Refill  . aspirin 81 MG tablet Take 81 mg by mouth daily.        Marland Kitchen atazanavir (REYATAZ) 300 MG capsule Take 1 capsule (300 mg total) by mouth daily with breakfast.  30 capsule  11  . dolutegravir ([name]) 50 MG tablet Take 1 tablet (50 mg total) by mouth daily.  30 tablet  11  . ENSURE (ENSURE) Take 1 Can by mouth 3 (three) times daily between meals.  237 mL  prn  . fenofibrate (TRICOR) 145 MG tablet TAKE 1 TABLET BY MOUTH DAILY  30 tablet  0  . gabapentin (NEURONTIN) 400 MG capsule TAKE ONE CAPSULE BY MOUTH FOUR TIMES DAILY  120 capsule  0   . morphine (MS CONTIN) 100 MG 12 hr tablet Take 1 tablet (100 mg total) by mouth 2 (two) times daily.  60 tablet  0  . oxyCODONE (OXY IR/ROXICODONE) 5 MG immediate release tablet Take 1 tablet (5 mg total) by mouth 2 (two) times daily.  60 tablet  0  . ritonavir (NORVIR) 100 MG TABS tablet Take 1 tablet (100 mg total) by mouth daily with breakfast.  30 tablet  11  . tenofovir ([name]) 300 MG tablet Take 1 tablet (300 mg total) by mouth daily.  30 tablet  11  . warfarin (COUMADIN) 5 MG tablet Take as directed by anticoagulation clinic provider.  90 tablet  3   No current facility-administered medications for this visit.   Family History  Problem Relation Age of Onset  . Arthritis Mother   . Kidney disease Maternal Grandfather    History   Social History  . Marital Status: Married    Spouse Name: N/A    Number of Children: 3  . Years of Education: N/A   Occupational History  .     Social History Main Topics  . Smoking status: Current  Every Day Smoker -- 1.00 packs/day for 36 years    Types: Cigarettes  . Smokeless tobacco: Never Used  . Alcohol Use: No  . Drug Use: No  . Sexual Activity: Not Currently     Comment: declined condoms   Other Topics Concern  . Not on file   Social History Narrative  . No narrative on file   Review of Systems: A 12 point ROS was performed; pertinent positives and negatives were noted in the HPI   Objective:  Physical Exam: Filed Vitals:   12/29/13 0917  BP: 121/72  Pulse: 84  Temp: 99 F (37.2 C)  TempSrc: Oral  Weight: 137 lb 12.8 oz (62.506 kg)  SpO2: 98%   Constitutional: Vital signs reviewed.  Patient is a well-developed and well-nourished male in no acute distress and cooperative with exam.  Head: Normocephalic and atraumatic Mouth: no erythema or exudates, MMM Eyes: PERRL, EOMI, conjunctivae normal.  Cardiovascular: RRR, no MRG Pulmonary/Chest: Normal respiratory effort, CTAB, no wheezes, rales, or rhonchi Abdominal:  Soft. Non-tender, non-distended, bowel sounds are normal, no masses, organomegaly, or guarding present.  Musculoskeletal: Right AKA. LLE w/o erythema, edema, or stiffness. Neurological: A&O x3, nonfocal.  Skin: Warm, dry and intact.  Psychiatric: Normal mood and affect. speech and behavior is normal.   Assessment & Plan:   Please refer to Problem List based Assessment and Plan

## 2013-12-29 NOTE — Assessment & Plan Note (Addendum)
Tbili elevated on the past few CMPs, but improved from the past. Atazanavir and dolutegravir can cause hyperbilirubinemia. Will recheck LFTs at his next clinic visit.

## 2013-12-29 NOTE — Assessment & Plan Note (Addendum)
Blood glucose elevated on past CMPs. Pt states that he drinks 1-2 liters of coke a day and eats a lot of candy a day. He denies polyuria or increased thirst. Checked A1c today which was 6.1. He is to cut back on his soda intake and decrease his candy intake. He is to f/u in 3 mo.

## 2013-12-29 NOTE — Assessment & Plan Note (Addendum)
Pt's weight is up 3lbs from January. He is using Ensure multiple time per day and gets free Ensure that lasts for 2 weeks. Pt unwilling to decrease his daily ensure consumption, which would allow him to have enough shakes to last for the entire month. Trying to get him enough to last for an entire month. Will have my nurse contact [place].

## 2013-12-29 NOTE — Assessment & Plan Note (Addendum)
Pt declines a colonoscopy at this time. He states that he likely had a tetanus shot with his AKA.

## 2014-01-01 NOTE — Progress Notes (Signed)
Case discussed with Dr. Glenn at the time of the visit.  We reviewed the resident's history and exam and pertinent patient test results.  I agree with the assessment, diagnosis, and plan of care documented in the resident's note.   

## 2014-01-03 ENCOUNTER — Other Ambulatory Visit: Payer: Self-pay | Admitting: Internal Medicine

## 2014-01-03 DIAGNOSIS — G629 Polyneuropathy, unspecified: Secondary | ICD-10-CM

## 2014-01-12 ENCOUNTER — Ambulatory Visit (INDEPENDENT_AMBULATORY_CARE_PROVIDER_SITE_OTHER): Payer: Medicare Other | Admitting: Pharmacist

## 2014-01-12 DIAGNOSIS — N289 Disorder of kidney and ureter, unspecified: Secondary | ICD-10-CM | POA: Diagnosis not present

## 2014-01-12 DIAGNOSIS — G547 Phantom limb syndrome without pain: Secondary | ICD-10-CM | POA: Diagnosis not present

## 2014-01-12 DIAGNOSIS — Z7901 Long term (current) use of anticoagulants: Secondary | ICD-10-CM

## 2014-01-12 DIAGNOSIS — B20 Human immunodeficiency virus [HIV] disease: Secondary | ICD-10-CM | POA: Diagnosis not present

## 2014-01-12 DIAGNOSIS — I749 Embolism and thrombosis of unspecified artery: Secondary | ICD-10-CM | POA: Diagnosis not present

## 2014-01-12 LAB — POCT INR: INR: 2.5

## 2014-01-12 NOTE — Patient Instructions (Signed)
Patient instructed to take medications as defined in the Anti-coagulation Track section of this encounter.  Patient instructed to take today's dose.  Patient verbalized understanding of these instructions.    

## 2014-01-12 NOTE — Progress Notes (Signed)
Anti-Coagulation Progress Note  [name] is a 53 y.o. male who is currently on an anti-coagulation regimen.    RECENT RESULTS: Recent results are below, the most recent result is correlated with a dose of 37.5 mg. per week:  Has seen his PCP recently. Review of PCP's note as well as note of Dr. Eston Esters (which see) indicates a recommendation for continued oral anticoagulation therapy. I did have a discussion with patient regarding risks/benefits of continued warfarin therapy. Patient relates he had been advised by Dr. Curt Jews of [facility] to CONTINUE warfarin (because of PAD in the contralateral leg, i.e. LEFT leg)--this even after his initial index event of RIGHT DVT as a sequelae of RIGHT BKA for which he wears a prosthesis. Patient states he was advised by CVTS that because of continued risks of arterial clots in his LEFT LEG--that is why he should continue warfarin so as to prevent a subsequent requirement of amputation due to decreased blood flow to his left leg. I discussed again--continued risks vs. Benefits. He is understanding of the risks of  Continued warfarin (bleeding, death due to bleeding) but stated "I had rather remain on warfarin than have another blood clot or loose my other leg". With advice and consent of the patient it would seem prudent to continue warfarin therapy but have periodic reassessment of this issue and document the same.  Lab Results  Component Value Date   INR 2.50 01/12/2014   INR 3.20 12/22/2013   INR 2.20 11/24/2013   PROTIME 38.4* 04/01/2012    ANTI-COAG DOSE: Anticoagulation Dose Instructions as of 01/12/2014     [name] Tue Wed Thu Fri Sat   New Dose 5 mg 5 mg 5 mg 7.5 mg 5 mg 5 mg 5 mg       ANTICOAG SUMMARY: Anticoagulation Episode Summary   Current INR goal 2.0-3.0  Next INR check 02/09/2014  INR from last check 2.50 (01/12/2014)  Weekly max dose   Target end date Indefinite  [facility]  Preferred lab   Send INR  reminders to ANTICOAG IMP   Indications  EMBOLISM AND THROMBOSIS ARTERY [444.9] Long term current use of anticoagulant [V58.61]        Comments       Anticoagulation Care Providers   Provider Role Specialty Phone number   Michel Bickers, MD  Infectious Diseases [phone]      ANTICOAG TODAY: Anticoagulation Summary as of 01/12/2014   INR goal 2.0-3.0  Selected INR 2.50 (01/12/2014)  Next INR check 02/09/2014  Target end date Indefinite   Indications  EMBOLISM AND THROMBOSIS ARTERY [444.9] Long term current use of anticoagulant [V58.61]      Anticoagulation Episode Summary   [facility]   Preferred lab    Send INR reminders to ANTICOAG IMP   Comments     Anticoagulation Care Providers   Provider Role Specialty Phone number   Michel Bickers, MD  Infectious Diseases [phone]      PATIENT INSTRUCTIONS: Patient Instructions  Patient instructed to take medications as defined in the Anti-coagulation Track section of this encounter.  Patient instructed to take today's dose.  Patient verbalized understanding of these instructions.       FOLLOW-UP Return in 4 weeks (on 02/09/2014) for Follow up INR at 2:15PM.  Jorene Guest, [name] Pharm.D., CACP

## 2014-01-12 NOTE — Progress Notes (Signed)
Indication: History of deep venous thrombosis Duration: Chronic per patient's informed choice INR: At target. Dr. Groce's assessment and plan were reviewed and I agree with his documentation. 

## 2014-01-20 ENCOUNTER — Other Ambulatory Visit: Payer: Medicare Other

## 2014-01-20 ENCOUNTER — Other Ambulatory Visit: Payer: Self-pay | Admitting: Licensed Clinical Social Worker

## 2014-01-20 DIAGNOSIS — G546 Phantom limb syndrome with pain: Secondary | ICD-10-CM

## 2014-01-20 DIAGNOSIS — G8929 Other chronic pain: Secondary | ICD-10-CM

## 2014-01-20 MED ORDER — OXYCODONE HCL 5 MG PO TABS: 5.0000 mg | ORAL_TABLET | Freq: Two times a day (BID) | ORAL | Status: DC

## 2014-01-20 MED ORDER — MORPHINE SULFATE ER 100 MG PO TBCR: 100.0000 mg | EXTENDED_RELEASE_TABLET | Freq: Two times a day (BID) | ORAL | Status: DC

## 2014-01-23 ENCOUNTER — Other Ambulatory Visit: Payer: Medicare Other

## 2014-01-23 DIAGNOSIS — Z79899 Other long term (current) drug therapy: Secondary | ICD-10-CM

## 2014-01-23 DIAGNOSIS — B20 Human immunodeficiency virus [HIV] disease: Secondary | ICD-10-CM | POA: Diagnosis not present

## 2014-01-23 LAB — CBC
HCT: 40 % (ref 39.0–52.0)
HEMOGLOBIN: 14.2 g/dL (ref 13.0–17.0)
MCH: 33.2 pg (ref 26.0–34.0)
MCHC: 35.5 g/dL (ref 30.0–36.0)
MCV: 93.5 fL (ref 78.0–100.0)
PLATELETS: 408 10*3/uL — AB (ref 150–400)
RBC: 4.28 MIL/uL (ref 4.22–5.81)
RDW: 13 % (ref 11.5–15.5)
WBC: 6.7 10*3/uL (ref 4.0–10.5)

## 2014-01-23 LAB — T-HELPER CELL (CD4) - (RCID CLINIC ONLY)
CD4 T CELL ABS: 780 /uL (ref 400–2700)
CD4 T CELL HELPER: 23 % — AB (ref 33–55)

## 2014-01-23 LAB — LIPID PANEL
CHOL/HDL RATIO: 4.8 ratio
Cholesterol: 188 mg/dL (ref 0–200)
HDL: 39 mg/dL — AB (ref >39)
LDL CALC: 131 mg/dL — AB (ref 0–99)
Triglycerides: 90 mg/dL (ref <150)
VLDL: 18 mg/dL (ref 0–40)

## 2014-01-23 LAB — COMPLETE METABOLIC PANEL WITH GFR
ALBUMIN: 4.5 g/dL (ref 3.5–5.2)
ALT: 12 U/L (ref 0–53)
AST: 14 U/L (ref 0–37)
Alkaline Phosphatase: 80 U/L (ref 39–117)
BILIRUBIN TOTAL: 1.1 mg/dL (ref 0.2–1.2)
BUN: 7 mg/dL (ref 6–23)
CO2: 25 meq/L (ref 19–32)
Calcium: 9 mg/dL (ref 8.4–10.5)
Chloride: 103 mEq/L (ref 96–112)
Creat: 1.41 mg/dL — ABNORMAL HIGH (ref 0.50–1.35)
GFR, EST AFRICAN AMERICAN: 66 mL/min
GFR, EST NON AFRICAN AMERICAN: 57 mL/min — AB
GLUCOSE: 123 mg/dL — AB (ref 70–99)
POTASSIUM: 3.6 meq/L (ref 3.5–5.3)
SODIUM: 134 meq/L — AB (ref 135–145)
Total Protein: 7.5 g/dL (ref 6.0–8.3)

## 2014-01-25 LAB — HIV-1 RNA QUANT-NO REFLEX-BLD

## 2014-01-27 ENCOUNTER — Other Ambulatory Visit: Payer: Self-pay | Admitting: Internal Medicine

## 2014-02-03 ENCOUNTER — Ambulatory Visit: Payer: Medicare Other | Admitting: Internal Medicine

## 2014-02-09 ENCOUNTER — Ambulatory Visit (INDEPENDENT_AMBULATORY_CARE_PROVIDER_SITE_OTHER): Payer: Medicare Other | Admitting: Pharmacist

## 2014-02-09 DIAGNOSIS — Z7901 Long term (current) use of anticoagulants: Secondary | ICD-10-CM

## 2014-02-09 DIAGNOSIS — I749 Embolism and thrombosis of unspecified artery: Secondary | ICD-10-CM | POA: Diagnosis not present

## 2014-02-09 LAB — POCT INR: INR: 1.6

## 2014-02-09 NOTE — Patient Instructions (Signed)
Patient instructed to take medications as defined in the Anti-coagulation Track section of this encounter.  Patient instructed to take today's dose.  Patient verbalized understanding of these instructions.    

## 2014-02-09 NOTE — Progress Notes (Signed)
Anti-Coagulation Progress Note  [name] is a 53 y.o. male who is currently on an anti-coagulation regimen.    RECENT RESULTS: Recent results are below, the most recent result is correlated with a dose of 37.5 mg. per week: Lab Results  Component Value Date   INR 1.60 02/09/2014   INR 2.50 01/12/2014   INR 3.20 12/22/2013   PROTIME 38.4* 04/01/2012    ANTI-COAG DOSE: Anticoagulation Dose Instructions as of 02/09/2014     [name] Tue Wed Thu Fri Sat   New Dose 5 mg 7.5 mg 5 mg 7.5 mg 5 mg 7.5 mg 5 mg       ANTICOAG SUMMARY: Anticoagulation Episode Summary   Current INR goal 2.0-3.0  Next INR check 03/09/2014  INR from last check 1.60! (02/09/2014)  Weekly max dose   Target end date Indefinite  [facility]  Preferred lab   Send INR reminders to ANTICOAG IMP   Indications  EMBOLISM AND THROMBOSIS ARTERY [444.9] Long term current use of anticoagulant [V58.61]        Comments       Anticoagulation Care Providers   Provider Role Specialty Phone number   Michel Bickers, MD  Infectious Diseases [phone]      ANTICOAG TODAY: Anticoagulation Summary as of 02/09/2014   INR goal 2.0-3.0  Selected INR 1.60! (02/09/2014)  Next INR check 03/09/2014  Target end date Indefinite   Indications  EMBOLISM AND THROMBOSIS ARTERY [444.9] Long term current use of anticoagulant [V58.61]      Anticoagulation Episode Summary   [facility]   Preferred lab    Send INR reminders to ANTICOAG IMP   Comments     Anticoagulation Care Providers   Provider Role Specialty Phone number   Michel Bickers, MD  Infectious Diseases [phone]      PATIENT INSTRUCTIONS: Patient Instructions  Patient instructed to take medications as defined in the Anti-coagulation Track section of this encounter.  Patient instructed to take today's dose.  Patient verbalized understanding of these instructions.       FOLLOW-UP Return in 4 weeks (on  03/09/2014) for Follow up INR at [place], [name] Pharm.D., CACP

## 2014-02-09 NOTE — Progress Notes (Signed)
INTERNAL MEDICINE TEACHING ATTENDING ADDENDUM - [name] M.D  Duration- indefinite, Indication- DVT, INR- subtherapeutic. Agree with Dr. Gladstone Pih recommendations as outlined in his note. Pt to f/u in 4 weeks for repeat INR check and reports change in his diet

## 2014-02-09 NOTE — Progress Notes (Signed)
Indication: History of deep venous thrombosis Duration: Chronic per patient's informed choice INR: Below target. Dr. Gladstone Pih assessment and plan were reviewed and I agree with his documentation.

## 2014-02-10 ENCOUNTER — Encounter: Payer: Self-pay | Admitting: Internal Medicine

## 2014-02-10 ENCOUNTER — Ambulatory Visit (INDEPENDENT_AMBULATORY_CARE_PROVIDER_SITE_OTHER): Payer: Medicare Other | Admitting: Internal Medicine

## 2014-02-10 VITALS — BP 129/73 | HR 59 | Temp 99.2°F | Ht 72.0 in | Wt 136.2 lb

## 2014-02-10 DIAGNOSIS — G547 Phantom limb syndrome without pain: Secondary | ICD-10-CM

## 2014-02-10 DIAGNOSIS — I749 Embolism and thrombosis of unspecified artery: Secondary | ICD-10-CM

## 2014-02-10 DIAGNOSIS — N289 Disorder of kidney and ureter, unspecified: Secondary | ICD-10-CM

## 2014-02-10 DIAGNOSIS — Z7901 Long term (current) use of anticoagulants: Secondary | ICD-10-CM | POA: Diagnosis not present

## 2014-02-10 DIAGNOSIS — G546 Phantom limb syndrome with pain: Secondary | ICD-10-CM

## 2014-02-10 DIAGNOSIS — B20 Human immunodeficiency virus [HIV] disease: Secondary | ICD-10-CM

## 2014-02-10 DIAGNOSIS — N189 Chronic kidney disease, unspecified: Secondary | ICD-10-CM | POA: Insufficient documentation

## 2014-02-10 MED ORDER — MORPHINE SULFATE ER 60 MG PO TBCR: 120.0000 mg | EXTENDED_RELEASE_TABLET | Freq: Two times a day (BID) | ORAL | Status: DC

## 2014-02-10 NOTE — Progress Notes (Signed)
Patient ID: [name], male   DOB: April 03, 1961, 53 y.o.   MRN: [id number] @[facility]         Patient Active Problem List   Diagnosis Date Noted  . Phantom limb syndrome with pain 03/21/2012    Priority: High  . Long term current use of anticoagulant 11/20/2010    Priority: High  . EMBOLISM AND THROMBOSIS, ARTERY 07/19/2009    Priority: High  . HIV DISEASE 11/10/2006    Priority: High  . DYSLIPIDEMIA 11/10/2006    Priority: High  . CIGARETTE SMOKER 11/10/2006    Priority: High  . Acute renal insufficiency 02/10/2014    Priority: Medium  . Hyperglycemia 12/29/2013    Priority: Medium  . Unintentional weight loss 11/04/2013    Priority: Medium  . Hyperbilirubinemia 12/29/2013  . Left hip pain 03/09/2013  . Preventative health care 03/09/2013  . History of DVT of lower extremity   . ERECTILE DYSFUNCTION, ORGANIC 04/12/2010  . GERD 08/23/2009  . METHICILLIN RESISTANT STAPHYLOCOCCUS AUREUS INFECTION 08/05/2009  . ENLARGEMENT OF LYMPH NODES 07/20/2009  . HYPOGONADISM 07/15/2009  . DEFICIENCY OF OTHER VITAMINS 07/15/2009  . PULMONARY NODULE 07/15/2009  . AKA, RIGHT, HX OF 07/15/2009  . SHINGLES, RECURRENT 11/10/2006  . HEPATITIS B 11/10/2006  . PSYCHOSIS 11/10/2006  . ALCOHOL ABUSE 11/10/2006  . PERIPHERAL NEUROPATHY 11/10/2006  . ALLERGIC RHINITIS 11/10/2006  . DENTAL CARIES 11/10/2006  . APPENDECTOMY, HX OF 11/10/2006    Patient's Medications  New Prescriptions   MORPHINE (MS CONTIN) 60 MG 12 HR TABLET    Take 2 tablets (120 mg total) by mouth every 12 (twelve) hours.  Previous Medications   ASPIRIN 81 MG TABLET    Take 81 mg by mouth daily.     ATAZANAVIR (REYATAZ) 300 MG CAPSULE    Take 1 capsule (300 mg total) by mouth daily with breakfast.   DOLUTEGRAVIR ([name]) 50 MG TABLET    Take 1 tablet (50 mg total) by mouth daily.   ENSURE (ENSURE)    Take 1 Can by mouth 3 (three) times daily between meals.   FENOFIBRATE (TRICOR) 145 MG TABLET    TAKE 1 TABLET BY MOUTH  DAILY   GABAPENTIN (NEURONTIN) 400 MG CAPSULE    TAKE ONE CAPSULE BY MOUTH FOUR TIMES DAILY   OXYCODONE (OXY IR/ROXICODONE) 5 MG IMMEDIATE RELEASE TABLET    Take 1 tablet (5 mg total) by mouth 2 (two) times daily.   RITONAVIR (NORVIR) 100 MG TABS TABLET    Take 1 tablet (100 mg total) by mouth daily with breakfast.   TENOFOVIR ([name]) 300 MG TABLET    Take 1 tablet (300 mg total) by mouth daily.   WARFARIN (COUMADIN) 5 MG TABLET    Take as directed by anticoagulation clinic provider.  Modified Medications   No medications on file  Discontinued Medications   MORPHINE (MS CONTIN) 100 MG 12 HR TABLET    Take 1 tablet (100 mg total) by mouth 2 (two) times daily.    Subjective: [name] is in for his routine visit. As usual he never misses any doses of his HIV medications. He continues to smoke cigarettes and has no current plans to quit. He did try an electronic cigarette for a period of time but felt like that was giving him too much menthol. He drinks about 2 L of Pepsi every day can usually has a candy bar at night. He states that his Phantom leg pain is under good control. He routinely takes oxycodone with his MS  Contin twice daily.  Review of Systems: Pertinent items are noted in HPI.  Past Medical History  Diagnosis Date  . History of chicken pox   . Hyperlipidemia   . History of DVT of lower extremity     right  . Left hip pain 03/09/2013  . Preventative health care 03/09/2013    History  Substance Use Topics  . Smoking status: Current Every Day Smoker -- 1.00 packs/day for 36 years    Types: Cigarettes  . Smokeless tobacco: Never Used  . Alcohol Use: No    Family History  Problem Relation Age of Onset  . Arthritis Mother   . Kidney disease Maternal Grandfather     Allergies  Allergen Reactions  . Dapsone     REACTION: fever  . Sulfamethoxazole-Trimethoprim     REACTION: fever    Objective: Temp: 99.2 F (37.3 C) (04/14 0927) Temp src: Oral (04/14 0927) BP: 129/73  mmHg (04/14 0927) Pulse Rate: 59 (04/14 0927) Body mass index is 18.47 kg/(m^2).  General: He is talkative and in good spirits Oral: No oropharyngeal lesions Skin: No rash Lungs: Clear Cor: Regular S1 and S2 with no murmurs Abdomen: Soft and nontender  Lab Results Lab Results  Component Value Date   WBC 6.7 01/23/2014   HGB 14.2 01/23/2014   HCT 40.0 01/23/2014   MCV 93.5 01/23/2014   PLT 408* 01/23/2014    Lab Results  Component Value Date   CREATININE 1.41* 01/23/2014   BUN 7 01/23/2014   NA 134* 01/23/2014   K 3.6 01/23/2014   CL 103 01/23/2014   CO2 25 01/23/2014    Lab Results  Component Value Date   ALT 12 01/23/2014   AST 14 01/23/2014   ALKPHOS 80 01/23/2014   BILITOT 1.1 01/23/2014    Lab Results  Component Value Date   CHOL 188 01/23/2014   HDL 39* 01/23/2014   LDLCALC 131* 01/23/2014   TRIG 90 01/23/2014   CHOLHDL 4.8 01/23/2014    Lab Results HIV 1 RNA Quant (copies/mL)  Date Value  01/23/2014 <20   10/03/2013 <20   03/19/2013 <20      CD4 T Cell Abs (/uL)  Date Value  01/23/2014 780   10/03/2013 840   03/19/2013 950      Assessment: His HIV infection is under excellent control. He has the option of simplifying his antiretroviral regimen by consolidating Reyataz and Norvir to the new combination tablet, Evotaz but he's developed some mild renal insufficiency. I will have him back after lab work in 3 months before making that decision.  I talked to him about the importance of quitting cigarettes completely.  He has hyperglycemia. I talked to him about dietary modification.  I suggested that we increase the dose of his MS Contin to control his chronic phantom limb pain and asked him to try to do without supplemental oxycodone.  Plan: 1. Continue current antiretroviral regimen 2. Cigarette cessation counseling provided 3. Dietary modification counseling provided 4. Increase MS Contin to 120 mg daily and use oxycodone only for severe breakthrough  pain 5. Followup after lab work in 3 months   Michel Bickers, MD [facility] for [place] [phone] pager   [phone] cell 02/10/2014, 9:50 AM

## 2014-02-13 ENCOUNTER — Telehealth: Payer: Self-pay | Admitting: *Deleted

## 2014-02-13 NOTE — Telephone Encounter (Signed)
Completed requested information.  Placed paperwork in Dr. Hale Bogus box for signature.  Must be faxed back by 4 PM Monday, April 20th.

## 2014-02-19 ENCOUNTER — Other Ambulatory Visit: Payer: Self-pay | Admitting: Licensed Clinical Social Worker

## 2014-02-19 DIAGNOSIS — G546 Phantom limb syndrome with pain: Secondary | ICD-10-CM

## 2014-02-19 DIAGNOSIS — G8929 Other chronic pain: Secondary | ICD-10-CM

## 2014-02-19 MED ORDER — MORPHINE SULFATE ER 60 MG PO TBCR: 120.0000 mg | EXTENDED_RELEASE_TABLET | Freq: Two times a day (BID) | ORAL | Status: DC

## 2014-02-19 MED ORDER — OXYCODONE HCL 5 MG PO TABS: 5.0000 mg | ORAL_TABLET | Freq: Two times a day (BID) | ORAL | Status: DC

## 2014-02-23 ENCOUNTER — Other Ambulatory Visit: Payer: Self-pay | Admitting: Internal Medicine

## 2014-02-23 DIAGNOSIS — G546 Phantom limb syndrome with pain: Secondary | ICD-10-CM

## 2014-02-23 MED ORDER — MORPHINE SULFATE ER 60 MG PO TBCR: 60.0000 mg | EXTENDED_RELEASE_TABLET | Freq: Two times a day (BID) | ORAL | Status: DC

## 2014-02-23 NOTE — Telephone Encounter (Signed)
Needed to change rx from ER to SR formulation.  Obtained new order from Dr. Megan Salon to change from ER to SR.  Dr. Megan Salon signed new rx.  Pt will need to take new rx to [facility] to have it filled.  [facility] does not carry the SR formulation.  Phone call to pt to come to office to pick up new paper rx.  Pt informed that he will need to go to [facility].  Phone call to [facility].to shred the paper copy of ER prescription.  [facility] confirmed that they would shred the rx.

## 2014-02-23 NOTE — Telephone Encounter (Signed)
[employer], Medicare Part D.  Ph # [phone], fax # [phone]. Approved 02/12/14/ to 02/13/15. Event# [id number]

## 2014-02-26 ENCOUNTER — Other Ambulatory Visit: Payer: Self-pay | Admitting: Internal Medicine

## 2014-03-05 ENCOUNTER — Telehealth: Payer: Self-pay | Admitting: *Deleted

## 2014-03-05 DIAGNOSIS — R634 Abnormal weight loss: Secondary | ICD-10-CM

## 2014-03-05 MED ORDER — ENSURE PO LIQD: 1.0000 | Freq: Three times a day (TID) | ORAL | Status: DC

## 2014-03-05 NOTE — Telephone Encounter (Signed)
Ensure rx to be faxed to [place].  Faxed.

## 2014-03-09 ENCOUNTER — Ambulatory Visit (INDEPENDENT_AMBULATORY_CARE_PROVIDER_SITE_OTHER): Payer: Medicare Other | Admitting: Pharmacist

## 2014-03-09 DIAGNOSIS — I749 Embolism and thrombosis of unspecified artery: Secondary | ICD-10-CM | POA: Diagnosis not present

## 2014-03-09 DIAGNOSIS — Z7901 Long term (current) use of anticoagulants: Secondary | ICD-10-CM | POA: Diagnosis not present

## 2014-03-09 DIAGNOSIS — B20 Human immunodeficiency virus [HIV] disease: Secondary | ICD-10-CM | POA: Diagnosis not present

## 2014-03-09 DIAGNOSIS — G547 Phantom limb syndrome without pain: Secondary | ICD-10-CM | POA: Diagnosis not present

## 2014-03-09 DIAGNOSIS — N289 Disorder of kidney and ureter, unspecified: Secondary | ICD-10-CM | POA: Diagnosis not present

## 2014-03-09 LAB — POCT INR: INR: 2

## 2014-03-09 NOTE — Patient Instructions (Signed)
Patient instructed to take medications as defined in the Anti-coagulation Track section of this encounter.  Patient instructed to take today's dose.  Patient verbalized understanding of these instructions.    

## 2014-03-09 NOTE — Progress Notes (Signed)
Anti-Coagulation Progress Note  [name] is a 53 y.o. male who is currently on an anti-coagulation regimen.    RECENT RESULTS: Recent results are below, the most recent result is correlated with a dose of 42.5 mg. per week: Lab Results  Component Value Date   INR 2.00 03/09/2014   INR 1.60 02/09/2014   INR 2.50 01/12/2014   PROTIME 38.4* 04/01/2012    ANTI-COAG DOSE: Anticoagulation Dose Instructions as of 03/09/2014     [name] Tue Wed Thu Fri Sat   New Dose 5 mg 7.5 mg 5 mg 7.5 mg 5 mg 7.5 mg 5 mg       ANTICOAG SUMMARY: Anticoagulation Episode Summary   Current INR goal 2.0-3.0  Next INR check 04/06/2014  INR from last check 2.00 (03/09/2014)  Weekly max dose   Target end date Indefinite  [facility]  Preferred lab   Send INR reminders to ANTICOAG IMP   Indications  EMBOLISM AND THROMBOSIS ARTERY [444.9] Long term current use of anticoagulant [V58.61]        Comments       Anticoagulation Care Providers   Provider Role Specialty Phone number   Michel Bickers, MD  Infectious Diseases [phone]      ANTICOAG TODAY: Anticoagulation Summary as of 03/09/2014   INR goal 2.0-3.0  Selected INR 2.00 (03/09/2014)  Next INR check 04/06/2014  Target end date Indefinite   Indications  EMBOLISM AND THROMBOSIS ARTERY [444.9] Long term current use of anticoagulant [V58.61]      Anticoagulation Episode Summary   [facility]   Preferred lab    Send INR reminders to ANTICOAG IMP   Comments     Anticoagulation Care Providers   Provider Role Specialty Phone number   Michel Bickers, MD  Infectious Diseases [phone]      PATIENT INSTRUCTIONS: Patient Instructions  Patient instructed to take medications as defined in the Anti-coagulation Track section of this encounter.  Patient instructed to take today's dose.  Patient verbalized understanding of these instructions.       FOLLOW-UP Return in 4 weeks (on 04/06/2014) for  Follow up INR at [place], [name] Pharm.D., CACP

## 2014-03-10 NOTE — Progress Notes (Signed)
Indication: History of deep venous thrombosis Duration: Chronic per patient's informed choice INR: At target. Dr. Groce's assessment and plan were reviewed and I agree with his documentation. 

## 2014-03-24 ENCOUNTER — Telehealth: Payer: Self-pay | Admitting: *Deleted

## 2014-03-24 DIAGNOSIS — G546 Phantom limb syndrome with pain: Secondary | ICD-10-CM

## 2014-03-24 DIAGNOSIS — G8929 Other chronic pain: Secondary | ICD-10-CM

## 2014-03-24 MED ORDER — OXYCODONE HCL 5 MG PO TABS: 5.0000 mg | ORAL_TABLET | Freq: Two times a day (BID) | ORAL | Status: DC

## 2014-03-24 MED ORDER — MORPHINE SULFATE ER 60 MG PO TBCR: 60.0000 mg | EXTENDED_RELEASE_TABLET | Freq: Two times a day (BID) | ORAL | Status: DC

## 2014-03-24 NOTE — Telephone Encounter (Signed)
Requesting to pick up 03/25/14.

## 2014-03-25 ENCOUNTER — Other Ambulatory Visit: Payer: Self-pay | Admitting: *Deleted

## 2014-03-25 DIAGNOSIS — G546 Phantom limb syndrome with pain: Secondary | ICD-10-CM

## 2014-03-25 MED ORDER — MORPHINE SULFATE ER 60 MG PO TBCR: 120.0000 mg | EXTENDED_RELEASE_TABLET | Freq: Two times a day (BID) | ORAL | Status: DC

## 2014-03-25 NOTE — Telephone Encounter (Signed)
MS Contin rx needs to be corrected and reprinted to reflect 2 tablets every 12 hours.

## 2014-03-27 ENCOUNTER — Other Ambulatory Visit: Payer: Self-pay | Admitting: Internal Medicine

## 2014-03-30 ENCOUNTER — Encounter: Payer: Self-pay | Admitting: *Deleted

## 2014-03-30 NOTE — Progress Notes (Signed)
Patient ID: [name], male   DOB: November 07, 1960, 53 y.o.   MRN: [id number] MS Contin SR prior authorization information.  Approved for 120-60 mg tablets/month through 02/13/15 per [employer], Event # [phone].   Customer Service # [phone]. Or Part D Appeals # [phone].

## 2014-04-06 ENCOUNTER — Ambulatory Visit (INDEPENDENT_AMBULATORY_CARE_PROVIDER_SITE_OTHER): Payer: Medicare Other | Admitting: Pharmacist

## 2014-04-06 DIAGNOSIS — I749 Embolism and thrombosis of unspecified artery: Secondary | ICD-10-CM | POA: Diagnosis not present

## 2014-04-06 DIAGNOSIS — Z7901 Long term (current) use of anticoagulants: Secondary | ICD-10-CM | POA: Diagnosis not present

## 2014-04-06 LAB — POCT INR: INR: 1.8

## 2014-04-06 NOTE — Patient Instructions (Signed)
Patient instructed to take medications as defined in the Anti-coagulation Track section of this encounter.  Patient instructed to take today's dose.  Patient verbalized understanding of these instructions.    

## 2014-04-06 NOTE — Progress Notes (Signed)
Anti-Coagulation Progress Note  [name] is a 53 y.o. male who is currently on an anti-coagulation regimen.    RECENT RESULTS: Recent results are below, the most recent result is correlated with a dose of 42.5 mg. per week: Lab Results  Component Value Date   INR 1.8 04/06/2014   INR 2.00 03/09/2014   INR 1.60 02/09/2014   PROTIME 38.4* 04/01/2012    ANTI-COAG DOSE: Anticoagulation Dose Instructions as of 04/06/2014     [name] Tue Wed Thu Fri Sat   New Dose 5 mg 7.5 mg 7.5 mg 7.5 mg 5 mg 7.5 mg 5 mg       ANTICOAG SUMMARY: Anticoagulation Episode Summary   Current INR goal 2.0-3.0  Next INR check 05/04/2014  INR from last check 1.8! (04/06/2014)  Weekly max dose   Target end date Indefinite  [facility]  Preferred lab   Send INR reminders to ANTICOAG IMP   Indications  EMBOLISM AND THROMBOSIS ARTERY [444.9] Long term current use of anticoagulant [V58.61]        Comments       Anticoagulation Care Providers   Provider Role Specialty Phone number   Michel Bickers, MD  Infectious Diseases [phone]      ANTICOAG TODAY: Anticoagulation Summary as of 04/06/2014   INR goal 2.0-3.0  Selected INR 1.8! (04/06/2014)  Next INR check 05/04/2014  Target end date Indefinite   Indications  EMBOLISM AND THROMBOSIS ARTERY [444.9] Long term current use of anticoagulant [V58.61]      Anticoagulation Episode Summary   [facility]   Preferred lab    Send INR reminders to ANTICOAG IMP   Comments     Anticoagulation Care Providers   Provider Role Specialty Phone number   Michel Bickers, MD  Infectious Diseases [phone]      PATIENT INSTRUCTIONS: Patient Instructions  Patient instructed to take medications as defined in the Anti-coagulation Track section of this encounter.  Patient instructed to take today's dose.  Patient verbalized understanding of these instructions.     FOLLOW-UP Return in about 4 weeks (around 05/04/2014)  for Follow up INR at [place], [name] Pharm.D., CACP

## 2014-04-15 ENCOUNTER — Encounter: Payer: Self-pay | Admitting: Internal Medicine

## 2014-04-15 ENCOUNTER — Ambulatory Visit (INDEPENDENT_AMBULATORY_CARE_PROVIDER_SITE_OTHER): Payer: Medicare Other | Admitting: Internal Medicine

## 2014-04-15 VITALS — BP 133/71 | HR 82 | Temp 97.9°F | Resp 20 | Ht 69.5 in | Wt 135.7 lb

## 2014-04-15 DIAGNOSIS — N289 Disorder of kidney and ureter, unspecified: Secondary | ICD-10-CM | POA: Diagnosis not present

## 2014-04-15 DIAGNOSIS — Z7901 Long term (current) use of anticoagulants: Secondary | ICD-10-CM

## 2014-04-15 DIAGNOSIS — B20 Human immunodeficiency virus [HIV] disease: Secondary | ICD-10-CM | POA: Diagnosis not present

## 2014-04-15 DIAGNOSIS — R17 Unspecified jaundice: Secondary | ICD-10-CM

## 2014-04-15 DIAGNOSIS — I749 Embolism and thrombosis of unspecified artery: Secondary | ICD-10-CM

## 2014-04-15 DIAGNOSIS — G547 Phantom limb syndrome without pain: Secondary | ICD-10-CM | POA: Diagnosis not present

## 2014-04-15 DIAGNOSIS — F17200 Nicotine dependence, unspecified, uncomplicated: Secondary | ICD-10-CM

## 2014-04-15 DIAGNOSIS — E785 Hyperlipidemia, unspecified: Secondary | ICD-10-CM

## 2014-04-15 NOTE — Assessment & Plan Note (Addendum)
Continues to take Coumadin. Last saw Dr. Elie Confer on 6/8 and INR was 1.8 at that time.  Last CBC was 12/2013 and Hgb was normal. Pt denies active bleeding and is doing well.

## 2014-04-15 NOTE — Assessment & Plan Note (Addendum)
Lipid panel from 12/2013 with normal total cholesterol at 188, elevated LD at 131 and low HDl at 39. He is currently on fenofibrate for previously elevated triglycerides, which are now wnl. Discussed adding a low dose statin, but the patient states he does not want to start the statin yet, he would like to focus on dietary changes for now, as he is eating fried chicken and pork chops and biscuits and gravy. Will hold off on adding the statin. Encouraged cutting out fried foods which seem to make up a large portion of his diet and encouraged increasing fish and healthy fats in his diet to decrease his LDL and increase his HDL.

## 2014-04-15 NOTE — Assessment & Plan Note (Signed)
In March, Cr 1.41, up from around 1.08-1.2. Would like to check renal function today, but [name] states that he is to have labs drawn for Dr. Megan Salon at the end of the month and he would like to wait until then for labs.

## 2014-04-15 NOTE — Patient Instructions (Signed)
Fat and Cholesterol Control Diet  Fat and cholesterol levels in your blood and organs are influenced by your diet. High levels of fat and cholesterol may lead to diseases of the heart, small and large blood vessels, gallbladder, liver, and pancreas.  CONTROLLING FAT AND CHOLESTEROL WITH DIET  Although exercise and lifestyle factors are important, your diet is key. That is because certain foods are known to raise cholesterol and others to lower it. The goal is to balance foods for their effect on cholesterol and more importantly, to replace saturated and trans fat with other types of fat, such as monounsaturated fat, polyunsaturated fat, and omega-3 fatty acids.  On average, a person should consume no more than 15 to 17 g of saturated fat daily. Saturated and trans fats are considered "bad" fats, and they will raise LDL cholesterol. Saturated fats are primarily found in animal products such as meats, butter, and cream. However, that does not mean you need to give up all your favorite foods. Today, there are good tasting, low-fat, low-cholesterol substitutes for most of the things you like to eat. Choose low-fat or nonfat alternatives. Choose round or loin cuts of red meat. These types of cuts are lowest in fat and cholesterol. Chicken (without the skin), fish, veal, and ground turkey breast are great choices. Eliminate fatty meats, such as hot dogs and salami. Even shellfish have little or no saturated fat. Have a 3 oz (85 g) portion when you eat lean meat, poultry, or fish.  Trans fats are also called "partially hydrogenated oils." They are oils that have been scientifically manipulated so that they are solid at room temperature resulting in a longer shelf life and improved taste and texture of foods in which they are added. Trans fats are found in stick margarine, some tub margarines, cookies, crackers, and baked goods.   When baking and cooking, oils are a great substitute for butter. The monounsaturated oils are  especially beneficial since it is believed they lower LDL and raise HDL. The oils you should avoid entirely are saturated tropical oils, such as coconut and palm.   Remember to eat a lot from food groups that are naturally free of saturated and trans fat, including fish, fruit, vegetables, beans, grains (barley, rice, couscous, bulgur wheat), and pasta (without cream sauces).   IDENTIFYING FOODS THAT LOWER FAT AND CHOLESTEROL   Soluble fiber may lower your cholesterol. This type of fiber is found in fruits such as apples, vegetables such as broccoli, potatoes, and carrots, legumes such as beans, peas, and lentils, and grains such as barley. Foods fortified with plant sterols (phytosterol) may also lower cholesterol. You should eat at least 2 g per day of these foods for a cholesterol lowering effect.   Read package labels to identify low-saturated fats, trans fat free, and low-fat foods at the supermarket. Select cheeses that have only 2 to 3 g saturated fat per ounce. Use a heart-healthy tub margarine that is free of trans fats or partially hydrogenated oil. When buying baked goods (cookies, crackers), avoid partially hydrogenated oils. Breads and muffins should be made from whole grains (whole-wheat or whole oat flour, instead of "flour" or "enriched flour"). Buy non-creamy canned soups with reduced salt and no added fats.   FOOD PREPARATION TECHNIQUES   Never deep-fry. If you must fry, either stir-fry, which uses very little fat, or use non-stick cooking sprays. When possible, broil, bake, or roast meats, and steam vegetables. Instead of putting butter or margarine on vegetables, use lemon   and herbs, applesauce, and cinnamon (for squash and sweet potatoes). Use nonfat yogurt, salsa, and low-fat dressings for salads.   LOW-SATURATED FAT / LOW-FAT FOOD SUBSTITUTES  Meats / Saturated Fat (g)  · Avoid: Steak, marbled (3 oz/85 g) / 11 g  · Choose: Steak, lean (3 oz/85 g) / 4 g  · Avoid: Hamburger (3 oz/85 g) / 7  g  · Choose: Hamburger, lean (3 oz/85 g) / 5 g  · Avoid: Ham (3 oz/85 g) / 6 g  · Choose: Ham, lean cut (3 oz/85 g) / 2.4 g  · Avoid: Chicken, with skin, dark meat (3 oz/85 g) / 4 g  · Choose: Chicken, skin removed, dark meat (3 oz/85 g) / 2 g  · Avoid: Chicken, with skin, light meat (3 oz/85 g) / 2.5 g  · Choose: Chicken, skin removed, light meat (3 oz/85 g) / 1 g  Dairy / Saturated Fat (g)  · Avoid: Whole milk (1 cup) / 5 g  · Choose: Low-fat milk, 2% (1 cup) / 3 g  · Choose: Low-fat milk, 1% (1 cup) / 1.5 g  · Choose: Skim milk (1 cup) / 0.3 g  · Avoid: Hard cheese (1 oz/28 g) / 6 g  · Choose: Skim milk cheese (1 oz/28 g) / 2 to 3 g  · Avoid: Cottage cheese, 4% fat (1 cup) / 6.5 g  · Choose: Low-fat cottage cheese, 1% fat (1 cup) / 1.5 g  · Avoid: Ice cream (1 cup) / 9 g  · Choose: Sherbet (1 cup) / 2.5 g  · Choose: Nonfat frozen yogurt (1 cup) / 0.3 g  · Choose: Frozen fruit bar / trace  · Avoid: Whipped cream (1 tbs) / 3.5 g  · Choose: Nondairy whipped topping (1 tbs) / 1 g  Condiments / Saturated Fat (g)  · Avoid: Mayonnaise (1 tbs) / 2 g  · Choose: Low-fat mayonnaise (1 tbs) / 1 g  · Avoid: Butter (1 tbs) / 7 g  · Choose: Extra light margarine (1 tbs) / 1 g  · Avoid: Coconut oil (1 tbs) / 11.8 g  · Choose: Olive oil (1 tbs) / 1.8 g  · Choose: Corn oil (1 tbs) / 1.7 g  · Choose: Safflower oil (1 tbs) / 1.2 g  · Choose: Sunflower oil (1 tbs) / 1.4 g  · Choose: Soybean oil (1 tbs) / 2.4 g  · Choose: Canola oil (1 tbs) / 1 g  Document Released: 10/16/2005 Document Revised: 02/10/2013 Document Reviewed: 04/06/2011  Exit[employer]® Patient Information ©2015 [employer]. This information is not intended to replace advice given to you by your health care provider. Make sure you discuss any questions you have with your health care provider.

## 2014-04-15 NOTE — Progress Notes (Signed)
Patient ID: [name], male   DOB: 12-04-1960, 53 y.o.   MRN: [id number]  Subjective:   Patient ID: [name] male   DOB: 11-30-1960 53 y.o.   MRN: [id number]  HPI: [name] is a 53 y.o. PMH HIV (CD4 780 w/ VL <20 on 01/23/14), h/o DVT on life-long anticoagulation for DVT, and GERD presents for a f/u appt for his cholesterol.   He was seen on 3/15 and a lipid panel was performed which revealed normal cholesterol with low HDL (39) and elevated LDL at 131. He states that he has been eating fried chicken with the skin and fired pork chops and biscuits and gravy. He is taking his fenofibrate daily and states that he is not interested in starting an new medication for his cholesterol.  He was also found to have an AKI w/ Cr 1.41, up from around 1.08-1.2.   He was seen by Dr. Megan Salon on 02/10/14 and was doing well at the time, except for his chronic phantom limb pain. MS Contin was increased to 60mg  BID. He is due for repeat labs at the end of the month.   He is still smoking, but has cut back to about 1/2 ppd or less.  He states that his phantom pain has been better since Dr. Megan Salon in creased his pain medication.   He denies fevers, chills, decreased appetite, chest pain, abd pain, N/V, or changes in urination.   Past Medical History  Diagnosis Date  . History of chicken pox   . Hyperlipidemia   . History of DVT of lower extremity     right  . Left hip pain 03/09/2013  . Preventative health care 03/09/2013   Current Outpatient Prescriptions  Medication Sig Dispense Refill  . aspirin 81 MG tablet Take 81 mg by mouth daily.        Marland Kitchen atazanavir (REYATAZ) 300 MG capsule Take 1 capsule (300 mg total) by mouth daily with breakfast.  30 capsule  11  . dolutegravir ([name]) 50 MG tablet Take 1 tablet (50 mg total) by mouth daily.  30 tablet  11  . ENSURE (ENSURE) Take 1 Can by mouth 3 (three) times daily between meals.  237 mL  prn  . fenofibrate (TRICOR) 145 MG tablet TAKE 1 TABLET  BY MOUTH EVERY DAY  30 tablet  5  . gabapentin (NEURONTIN) 400 MG capsule TAKE ONE CAPSULE BY MOUTH FOUR TIMES DAILY  120 capsule  3  . morphine (MS CONTIN) 60 MG 12 hr tablet Take 2 tablets (120 mg total) by mouth every 12 (twelve) hours.  120 tablet  0  . oxyCODONE (OXY IR/ROXICODONE) 5 MG immediate release tablet Take 1 tablet (5 mg total) by mouth 2 (two) times daily.  60 tablet  0  . ritonavir (NORVIR) 100 MG TABS tablet Take 1 tablet (100 mg total) by mouth daily with breakfast.  30 tablet  11  . tenofovir ([name]) 300 MG tablet Take 1 tablet (300 mg total) by mouth daily.  30 tablet  11  . warfarin (COUMADIN) 5 MG tablet Take as directed by anticoagulation clinic provider.  90 tablet  3   No current facility-administered medications for this visit.   Family History  Problem Relation Age of Onset  . Arthritis Mother   . Kidney disease Maternal Grandfather    History   Social History  . Marital Status: Married    Spouse Name: N/A    Number of Children: 3  . Years of Education:  N/A   Occupational History  .     Social History Main Topics  . Smoking status: Current Every Day Smoker -- 1.00 packs/day for 36 years    Types: Cigarettes  . Smokeless tobacco: Never Used  . Alcohol Use: No  . Drug Use: No  . Sexual Activity: Not Currently     Comment: declined condoms   Other Topics Concern  . Not on file   Social History Narrative  . No narrative on file   Review of Systems: A 12 point ROS was performed; pertinent positives and negatives were noted in the HPI   Objective:  Physical Exam: Filed Vitals:   04/15/14 1319 04/15/14 1321  BP: 136/71 133/71  Pulse: 82   Temp: 97.9 F (36.6 C)   TempSrc: Oral   Resp: 20   Height: 5' 9.5" (1.765 m)   Weight: 135 lb 11.2 oz (61.553 kg)   SpO2: 98%    Constitutional: Vital signs reviewed.  Patient is a well-developed and well-nourished male in no acute distress and cooperative with exam.   Head: Normocephalic and  atraumatic Mouth: no erythema or exudates, MMM. Eyes: PERRL, EOMI.  Cardiovascular: RRR, no MRG, radial pulses symmetric and intact bilaterally Pulmonary/Chest: Normal respiratory effort, CTAB, no wheezes, rales, or rhonchi Abdominal: Soft. Non-tender, non-distended, hypoactive bowel sounds Musculoskeletal: R AKA. LLE w/ no edema Neurological: A&O x3, Strength is normal and symmetric in bilateral UE, cranial nerve II-XII are grossly intact, no focal motor deficit Skin: Warm, dry and intact. No appreciable rash Psychiatric: Normal mood and affect. speech and behavior is normal.   Assessment & Plan:   Please refer to Problem List based Assessment and Plan

## 2014-04-15 NOTE — Assessment & Plan Note (Addendum)
Resolved. Tbili from 12/2013 1.1

## 2014-04-15 NOTE — Assessment & Plan Note (Signed)
  Assessment: Progress toward smoking cessation:  smoking less Barriers to progress toward smoking cessation:  lack of motivation to quit Comments: Pt states that he has cut back to about 1/2 ppd or less.   Plan: Instruction/counseling given:  I counseled patient on the dangers of tobacco use, advised patient to stop smoking, and reviewed strategies to maximize success.

## 2014-04-15 NOTE — Progress Notes (Signed)
Case discussed with Dr. Glenn soon after the resident saw the patient.  We reviewed the resident's history and exam and pertinent patient test results.  I agree with the assessment, diagnosis, and plan of care documented in the resident's note. 

## 2014-04-22 ENCOUNTER — Telehealth: Payer: Self-pay | Admitting: *Deleted

## 2014-04-22 DIAGNOSIS — G8929 Other chronic pain: Secondary | ICD-10-CM

## 2014-04-22 DIAGNOSIS — G546 Phantom limb syndrome with pain: Secondary | ICD-10-CM

## 2014-04-22 MED ORDER — MORPHINE SULFATE ER 60 MG PO TBCR: 120.0000 mg | EXTENDED_RELEASE_TABLET | Freq: Two times a day (BID) | ORAL | Status: DC

## 2014-04-22 MED ORDER — OXYCODONE HCL 5 MG PO TABS: 5.0000 mg | ORAL_TABLET | Freq: Two times a day (BID) | ORAL | Status: DC

## 2014-04-22 NOTE — Telephone Encounter (Signed)
Pt called to let him know that rx was ready for pick up.

## 2014-04-26 ENCOUNTER — Other Ambulatory Visit: Payer: Self-pay | Admitting: Internal Medicine

## 2014-04-27 ENCOUNTER — Other Ambulatory Visit: Payer: Self-pay | Admitting: *Deleted

## 2014-04-27 DIAGNOSIS — G629 Polyneuropathy, unspecified: Secondary | ICD-10-CM

## 2014-04-27 MED ORDER — GABAPENTIN 400 MG PO CAPS: ORAL_CAPSULE | ORAL | Status: DC

## 2014-04-28 ENCOUNTER — Other Ambulatory Visit (INDEPENDENT_AMBULATORY_CARE_PROVIDER_SITE_OTHER): Payer: Medicare Other

## 2014-04-28 DIAGNOSIS — B20 Human immunodeficiency virus [HIV] disease: Secondary | ICD-10-CM

## 2014-04-28 LAB — COMPLETE METABOLIC PANEL WITH GFR
ALT: 20 U/L (ref 0–53)
AST: 22 U/L (ref 0–37)
Albumin: 4.3 g/dL (ref 3.5–5.2)
Alkaline Phosphatase: 102 U/L (ref 39–117)
BILIRUBIN TOTAL: 1.1 mg/dL (ref 0.2–1.2)
BUN: 8 mg/dL (ref 6–23)
CO2: 26 mEq/L (ref 19–32)
Calcium: 8.8 mg/dL (ref 8.4–10.5)
Chloride: 101 mEq/L (ref 96–112)
Creat: 1.33 mg/dL (ref 0.50–1.35)
GFR, Est African American: 70 mL/min
GFR, Est Non African American: 61 mL/min
Glucose, Bld: 96 mg/dL (ref 70–99)
Potassium: 3.5 mEq/L (ref 3.5–5.3)
SODIUM: 134 meq/L — AB (ref 135–145)
TOTAL PROTEIN: 7.8 g/dL (ref 6.0–8.3)

## 2014-04-29 LAB — T-HELPER CELL (CD4) - (RCID CLINIC ONLY)
CD4 % Helper T Cell: 20 % — ABNORMAL LOW (ref 33–55)
CD4 T Cell Abs: 840 /uL (ref 400–2700)

## 2014-05-01 LAB — HIV-1 RNA QUANT-NO REFLEX-BLD
HIV 1 RNA Quant: <20 copies/mL (ref <20)
HIV-1 RNA Quant, Log: <1.3 {Log} (ref <1.30)

## 2014-05-04 ENCOUNTER — Ambulatory Visit (INDEPENDENT_AMBULATORY_CARE_PROVIDER_SITE_OTHER): Payer: Medicare Other | Admitting: Pharmacist

## 2014-05-04 DIAGNOSIS — I749 Embolism and thrombosis of unspecified artery: Secondary | ICD-10-CM

## 2014-05-04 DIAGNOSIS — B20 Human immunodeficiency virus [HIV] disease: Secondary | ICD-10-CM | POA: Diagnosis not present

## 2014-05-04 DIAGNOSIS — Z79899 Other long term (current) drug therapy: Secondary | ICD-10-CM | POA: Diagnosis not present

## 2014-05-04 DIAGNOSIS — Z7901 Long term (current) use of anticoagulants: Secondary | ICD-10-CM

## 2014-05-04 DIAGNOSIS — R634 Abnormal weight loss: Secondary | ICD-10-CM | POA: Diagnosis not present

## 2014-05-04 LAB — POCT INR: INR: 2.9

## 2014-05-04 MED ORDER — WARFARIN SODIUM 5 MG PO TABS: ORAL_TABLET | ORAL | Status: DC

## 2014-05-04 NOTE — Progress Notes (Signed)
Anti-Coagulation Progress Note  [name] is a 53 y.o. male who is currently on an anti-coagulation regimen.    RECENT RESULTS: Recent results are below, the most recent result is correlated with a dose of 45 mg. per week: Lab Results  Component Value Date   INR 2.90 05/04/2014   INR 1.8 04/06/2014   INR 2.00 03/09/2014   PROTIME 38.4* 04/01/2012    ANTI-COAG DOSE: Anticoagulation Dose Instructions as of 05/04/2014     [name] Tue Wed Thu Fri Sat   New Dose 5 mg 7.5 mg 5 mg 7.5 mg 5 mg 7.5 mg 5 mg       ANTICOAG SUMMARY: Anticoagulation Episode Summary   Current INR goal 2.0-3.0  Next INR check 06/08/2014  INR from last check 2.90 (05/04/2014)  Weekly max dose   Target end date Indefinite  [facility]  Preferred lab   Send INR reminders to ANTICOAG IMP   Indications  EMBOLISM AND THROMBOSIS ARTERY [444.9] Long term current use of anticoagulant [V58.61]        Comments       Anticoagulation Care Providers   Provider Role Specialty Phone number   Michel Bickers, MD  Infectious Diseases [phone]      ANTICOAG TODAY: Anticoagulation Summary as of 05/04/2014   INR goal 2.0-3.0  Selected INR 2.90 (05/04/2014)  Next INR check 06/08/2014  Target end date Indefinite   Indications  EMBOLISM AND THROMBOSIS ARTERY [444.9] Long term current use of anticoagulant [V58.61]      Anticoagulation Episode Summary   [facility]   Preferred lab    Send INR reminders to ANTICOAG IMP   Comments     Anticoagulation Care Providers   Provider Role Specialty Phone number   Michel Bickers, MD  Infectious Diseases [phone]      PATIENT INSTRUCTIONS: Patient Instructions  Patient instructed to take medications as defined in the Anti-coagulation Track section of this encounter.  Patient instructed to take today's dose.  Patient verbalized understanding of these instructions.       FOLLOW-UP Return in 5 weeks (on 06/08/2014) for  Follow up INR at [place], [name] Pharm.D., CACP

## 2014-05-04 NOTE — Patient Instructions (Signed)
Patient instructed to take medications as defined in the Anti-coagulation Track section of this encounter.  Patient instructed to take today's dose.  Patient verbalized understanding of these instructions.    

## 2014-05-12 ENCOUNTER — Ambulatory Visit (INDEPENDENT_AMBULATORY_CARE_PROVIDER_SITE_OTHER): Payer: Medicare Other | Admitting: Internal Medicine

## 2014-05-12 ENCOUNTER — Encounter: Payer: Self-pay | Admitting: Internal Medicine

## 2014-05-12 VITALS — BP 144/72 | HR 79 | Temp 98.2°F | Ht 72.0 in | Wt 137.0 lb

## 2014-05-12 DIAGNOSIS — Z79899 Other long term (current) drug therapy: Secondary | ICD-10-CM

## 2014-05-12 DIAGNOSIS — I749 Embolism and thrombosis of unspecified artery: Secondary | ICD-10-CM

## 2014-05-12 DIAGNOSIS — B20 Human immunodeficiency virus [HIV] disease: Secondary | ICD-10-CM | POA: Diagnosis not present

## 2014-05-12 MED ORDER — ATAZANAVIR-COBICISTAT 300-150 MG PO TABS: 1.0000 | ORAL_TABLET | Freq: Every day | ORAL | Status: DC

## 2014-05-12 NOTE — Progress Notes (Signed)
Patient ID: [name], male   DOB: Mar 08, 1961, 53 y.o.   MRN: [id number]          Patient Active Problem List   Diagnosis Date Noted  . Phantom limb syndrome with pain 03/21/2012    Priority: High  . Long term current use of anticoagulant 11/20/2010    Priority: High  . EMBOLISM AND THROMBOSIS, ARTERY 07/19/2009    Priority: High  . HIV DISEASE 11/10/2006    Priority: High  . DYSLIPIDEMIA 11/10/2006    Priority: High  . CIGARETTE SMOKER 11/10/2006    Priority: High  . Acute renal insufficiency 02/10/2014    Priority: Medium  . Hyperglycemia 12/29/2013    Priority: Medium  . Unintentional weight loss 11/04/2013    Priority: Medium  . Hyperbilirubinemia 12/29/2013  . Left hip pain 03/09/2013  . Preventative health care 03/09/2013  . History of DVT of lower extremity   . ERECTILE DYSFUNCTION, ORGANIC 04/12/2010  . GERD 08/23/2009  . METHICILLIN RESISTANT STAPHYLOCOCCUS AUREUS INFECTION 08/05/2009  . ENLARGEMENT OF LYMPH NODES 07/20/2009  . HYPOGONADISM 07/15/2009  . DEFICIENCY OF OTHER VITAMINS 07/15/2009  . PULMONARY NODULE 07/15/2009  . AKA, RIGHT, HX OF 07/15/2009  . SHINGLES, RECURRENT 11/10/2006  . HEPATITIS B 11/10/2006  . PSYCHOSIS 11/10/2006  . ALCOHOL ABUSE 11/10/2006  . PERIPHERAL NEUROPATHY 11/10/2006  . ALLERGIC RHINITIS 11/10/2006  . DENTAL CARIES 11/10/2006  . APPENDECTOMY, HX OF 11/10/2006    Patient's Medications  New Prescriptions   ATAZANAVIR-COBICISTAT (EVOTAZ) 300-150 MG PER TABLET    Take 1 tablet by mouth daily. Swallow whole. Do NOT crush, cut or chew tablet. Take with food.  Previous Medications   ASPIRIN 81 MG TABLET    Take 81 mg by mouth daily.     DOLUTEGRAVIR ([name]) 50 MG TABLET    Take 1 tablet (50 mg total) by mouth daily.   ENSURE (ENSURE)    Take 1 Can by mouth 3 (three) times daily between meals.   FENOFIBRATE (TRICOR) 145 MG TABLET    TAKE 1 TABLET BY MOUTH EVERY DAY   GABAPENTIN (NEURONTIN) 400 MG CAPSULE    TAKE ONE  CAPSULE BY MOUTH FOUR TIMES DAILY   MORPHINE (MS CONTIN) 60 MG 12 HR TABLET    Take 2 tablets (120 mg total) by mouth every 12 (twelve) hours.   OXYCODONE (OXY IR/ROXICODONE) 5 MG IMMEDIATE RELEASE TABLET    Take 1 tablet (5 mg total) by mouth 2 (two) times daily.   TENOFOVIR ([name]) 300 MG TABLET    Take 1 tablet (300 mg total) by mouth daily.   WARFARIN (COUMADIN) 5 MG TABLET    Take as directed by anticoagulation clinic provider.  Modified Medications   No medications on file  Discontinued Medications   ATAZANAVIR (REYATAZ) 300 MG CAPSULE    Take 1 capsule (300 mg total) by mouth daily with breakfast.   RITONAVIR (NORVIR) 100 MG TABS TABLET    Take 1 tablet (100 mg total) by mouth daily with breakfast.    Subjective: 20 is in for his routine visit. He states he never misses any doses of his medications but his mail-order pharmacy dosing and his HIV medications at different times each month and his Humphrey Rolls is out of sync with his other refills. He states that he is doing well with the exception of having 2 uncles and an aunt die in the last few months.  Review of Systems: Pertinent items are noted in HPI.  Past Medical History  Diagnosis Date  . History of chicken pox   . Hyperlipidemia   . History of DVT of lower extremity     right  . Left hip pain 03/09/2013  . Preventative health care 03/09/2013    History  Substance Use Topics  . Smoking status: Current Every Day Smoker -- 1.00 packs/day for 36 years    Types: Cigarettes  . Smokeless tobacco: Never Used  . Alcohol Use: No    Family History  Problem Relation Age of Onset  . Arthritis Mother   . Kidney disease Maternal Grandfather     Allergies  Allergen Reactions  . Dapsone     REACTION: fever  . Sulfamethoxazole-Trimethoprim     REACTION: fever    Objective: Temp: 98.2 F (36.8 C) (07/14 1448) Temp src: Oral (07/14 1448) BP: 144/72 mmHg (07/14 1448) Pulse Rate: 79 (07/14 1448) Body mass index is 18.58  kg/(m^2).  General: He is in good spirits and in no distress Oral: No oropharyngeal lesions Skin: No rash Lungs: Clear Cor: Regular S1-S2 with no murmurs  Lab Results Lab Results  Component Value Date   WBC 6.7 01/23/2014   HGB 14.2 01/23/2014   HCT 40.0 01/23/2014   MCV 93.5 01/23/2014   PLT 408* 01/23/2014    Lab Results  Component Value Date   CREATININE 1.33 04/28/2014   BUN 8 04/28/2014   NA 134* 04/28/2014   K 3.5 04/28/2014   CL 101 04/28/2014   CO2 26 04/28/2014    Lab Results  Component Value Date   ALT 20 04/28/2014   AST 22 04/28/2014   ALKPHOS 102 04/28/2014   BILITOT 1.1 04/28/2014    Lab Results  Component Value Date   CHOL 188 01/23/2014   HDL 39* 01/23/2014   LDLCALC 131* 01/23/2014   TRIG 90 01/23/2014   CHOLHDL 4.8 01/23/2014    Lab Results HIV 1 RNA Quant (copies/mL)  Date Value  04/28/2014 <20   01/23/2014 <20   10/03/2013 <20      CD4 T Cell Abs (/uL)  Date Value  04/28/2014 840   01/23/2014 780   10/03/2013 840      Assessment: To his HIV infection remains under excellent control. His creatinine is back to normal. He is interested in simplifying his regimen to [name], [name] and Evotaz.  Plan: 1. Continue [name] and [name] and change boosted Reyataz to Evotaz 2. Follow up after lab work in [place] months   Michel Bickers, MD [facility] [place] [phone] pager   [phone] cell 05/12/2014, 3:10 PM

## 2014-05-20 ENCOUNTER — Other Ambulatory Visit: Payer: Self-pay | Admitting: *Deleted

## 2014-05-20 DIAGNOSIS — G546 Phantom limb syndrome with pain: Secondary | ICD-10-CM

## 2014-05-20 DIAGNOSIS — G8929 Other chronic pain: Secondary | ICD-10-CM

## 2014-05-20 MED ORDER — OXYCODONE HCL 5 MG PO TABS: 5.0000 mg | ORAL_TABLET | Freq: Two times a day (BID) | ORAL | Status: DC

## 2014-05-20 MED ORDER — MORPHINE SULFATE ER 60 MG PO TBCR: 120.0000 mg | EXTENDED_RELEASE_TABLET | Freq: Two times a day (BID) | ORAL | Status: DC

## 2014-05-25 ENCOUNTER — Other Ambulatory Visit: Payer: Self-pay | Admitting: Licensed Clinical Social Worker

## 2014-05-25 DIAGNOSIS — Z21 Asymptomatic human immunodeficiency virus [HIV] infection status: Secondary | ICD-10-CM

## 2014-05-25 DIAGNOSIS — B20 Human immunodeficiency virus [HIV] disease: Secondary | ICD-10-CM

## 2014-05-25 MED ORDER — TENOFOVIR DISOPROXIL FUMARATE 300 MG PO TABS: 300.0000 mg | ORAL_TABLET | Freq: Every day | ORAL | Status: DC

## 2014-05-25 MED ORDER — DOLUTEGRAVIR SODIUM 50 MG PO TABS: 50.0000 mg | ORAL_TABLET | Freq: Every day | ORAL | Status: DC

## 2014-05-25 MED ORDER — ATAZANAVIR-COBICISTAT 300-150 MG PO TABS: 1.0000 | ORAL_TABLET | Freq: Every day | ORAL | Status: DC

## 2014-06-08 ENCOUNTER — Ambulatory Visit (INDEPENDENT_AMBULATORY_CARE_PROVIDER_SITE_OTHER): Payer: Medicare Other | Admitting: Pharmacist

## 2014-06-08 DIAGNOSIS — Z7901 Long term (current) use of anticoagulants: Secondary | ICD-10-CM | POA: Diagnosis not present

## 2014-06-08 DIAGNOSIS — I749 Embolism and thrombosis of unspecified artery: Secondary | ICD-10-CM | POA: Diagnosis not present

## 2014-06-08 LAB — POCT INR: INR: 4.8

## 2014-06-08 NOTE — Patient Instructions (Signed)
Patient instructed to take medications as defined in the Anti-coagulation Track section of this encounter.  Patient instructed to take today's dose.  Patient verbalized understanding of these instructions.    

## 2014-06-08 NOTE — Progress Notes (Signed)
Anti-Coagulation Progress Note  [name] is a 53 y.o. male who is currently on an anti-coagulation regimen.    RECENT RESULTS: Recent results are below, the most recent result is correlated with a dose of 42.5 mg. per week: Lab Results  Component Value Date   INR 4.80 06/08/2014   INR 2.90 05/04/2014   INR 1.8 04/06/2014   PROTIME 38.4* 04/01/2012    ANTI-COAG DOSE: Anticoagulation Dose Instructions as of 06/08/2014     [name] Tue Wed Thu Fri Sat   New Dose 5 mg 5 mg 5 mg 7.5 mg 5 mg 5 mg 5 mg       ANTICOAG SUMMARY: Anticoagulation Episode Summary   Current INR goal 2.0-3.0  Next INR check 06/29/2014  INR from last check 4.80! (06/08/2014)  Weekly max dose   Target end date Indefinite  [facility]  Preferred lab   Send INR reminders to ANTICOAG IMP   Indications  EMBOLISM AND THROMBOSIS ARTERY [444.9] Long term current use of anticoagulant [V58.61]        Comments       Anticoagulation Care Providers   Provider Role Specialty Phone number   Michel Bickers, MD  Infectious Diseases [phone]      ANTICOAG TODAY: Anticoagulation Summary as of 06/08/2014   INR goal 2.0-3.0  Selected INR 4.80! (06/08/2014)  Next INR check 06/29/2014  Target end date Indefinite   Indications  EMBOLISM AND THROMBOSIS ARTERY [444.9] Long term current use of anticoagulant [V58.61]      Anticoagulation Episode Summary   [facility]   Preferred lab    Send INR reminders to ANTICOAG IMP   Comments     Anticoagulation Care Providers   Provider Role Specialty Phone number   Michel Bickers, MD  Infectious Diseases [phone]      PATIENT INSTRUCTIONS: Patient Instructions  Patient instructed to take medications as defined in the Anti-coagulation Track section of this encounter.  Patient instructed to take today's dose.  Patient verbalized understanding of these instructions.       FOLLOW-UP Return in 3 weeks (on 06/29/2014) for  Follow up INR at [place], [name] Pharm.D., CACP

## 2014-06-22 ENCOUNTER — Other Ambulatory Visit: Payer: Self-pay | Admitting: *Deleted

## 2014-06-22 DIAGNOSIS — G8929 Other chronic pain: Secondary | ICD-10-CM

## 2014-06-22 DIAGNOSIS — G546 Phantom limb syndrome with pain: Secondary | ICD-10-CM

## 2014-06-22 MED ORDER — MORPHINE SULFATE ER 60 MG PO TBCR: 120.0000 mg | EXTENDED_RELEASE_TABLET | Freq: Two times a day (BID) | ORAL | Status: DC

## 2014-06-22 MED ORDER — OXYCODONE HCL 5 MG PO TABS: 5.0000 mg | ORAL_TABLET | Freq: Two times a day (BID) | ORAL | Status: DC

## 2014-06-22 NOTE — Progress Notes (Signed)
I have reviewed Dr. Gladstone Pih note.  He is on coumadin for DVT.  He had an elevated INR and coumadin dose was decreased.

## 2014-06-29 ENCOUNTER — Ambulatory Visit (INDEPENDENT_AMBULATORY_CARE_PROVIDER_SITE_OTHER): Payer: Medicare Other | Admitting: Pharmacist

## 2014-06-29 DIAGNOSIS — Z7901 Long term (current) use of anticoagulants: Secondary | ICD-10-CM

## 2014-06-29 DIAGNOSIS — I749 Embolism and thrombosis of unspecified artery: Secondary | ICD-10-CM

## 2014-06-29 LAB — POCT INR: INR: 3.9

## 2014-06-29 NOTE — Patient Instructions (Signed)
Patient instructed to take medications as defined in the Anti-coagulation Track section of this encounter.  Patient instructed to take today's dose.  Patient verbalized understanding of these instructions.    

## 2014-06-29 NOTE — Progress Notes (Signed)
Anti-Coagulation Progress Note  [name] is a 53 y.o. male who is currently on an anti-coagulation regimen.    RECENT RESULTS: Recent results are below, the most recent result is correlated with a dose of 37.5 mg. per week: Lab Results  Component Value Date   INR 3.9 06/29/2014   INR 4.80 06/08/2014   INR 2.90 05/04/2014   PROTIME 38.4* 04/01/2012    ANTI-COAG DOSE: Anticoagulation Dose Instructions as of 06/29/2014     [name] Tue Wed Thu Fri Sat   New Dose 5 mg 5 mg 5 mg 2.5 mg 5 mg 5 mg 5 mg       ANTICOAG SUMMARY: Anticoagulation Episode Summary   Current INR goal 2.0-3.0  Next INR check 07/20/2014  INR from last check 3.9! (06/29/2014)  Weekly max dose   Target end date Indefinite  [facility]  Preferred lab   Send INR reminders to ANTICOAG IMP   Indications  EMBOLISM AND THROMBOSIS ARTERY [444.9] Long term current use of anticoagulant [V58.61]        Comments       Anticoagulation Care Providers   Provider Role Specialty Phone number   Michel Bickers, MD  Infectious Diseases [phone]      ANTICOAG TODAY: Anticoagulation Summary as of 06/29/2014   INR goal 2.0-3.0  Selected INR 3.9! (06/29/2014)  Next INR check 07/20/2014  Target end date Indefinite   Indications  EMBOLISM AND THROMBOSIS ARTERY [444.9] Long term current use of anticoagulant [V58.61]      Anticoagulation Episode Summary   [facility]   Preferred lab    Send INR reminders to ANTICOAG IMP   Comments     Anticoagulation Care Providers   Provider Role Specialty Phone number   Michel Bickers, MD  Infectious Diseases [phone]      PATIENT INSTRUCTIONS: Patient Instructions  Patient instructed to take medications as defined in the Anti-coagulation Track section of this encounter.  Patient instructed to take today's dose.  Patient verbalized understanding of these instructions.       FOLLOW-UP Return in 3 weeks (on 07/20/2014) for  Follow-up INR at 2pm.  Elicia Lamp, PharmD Clinical Pharmacy Resident

## 2014-06-29 NOTE — Progress Notes (Signed)
INTERNAL MEDICINE TEACHING ATTENDING ADDENDUM - Aldine Contes M.D  Duration- Lifelong, Indication- bilateral DVT, INR- supratherapeutic. Agree with Dr. Gladstone Pih recommendations as outlined in his note.

## 2014-07-16 ENCOUNTER — Other Ambulatory Visit: Payer: Self-pay | Admitting: *Deleted

## 2014-07-20 ENCOUNTER — Ambulatory Visit (INDEPENDENT_AMBULATORY_CARE_PROVIDER_SITE_OTHER): Payer: Medicare Other | Admitting: Pharmacist

## 2014-07-20 DIAGNOSIS — Z7901 Long term (current) use of anticoagulants: Secondary | ICD-10-CM | POA: Diagnosis not present

## 2014-07-20 DIAGNOSIS — I749 Embolism and thrombosis of unspecified artery: Secondary | ICD-10-CM

## 2014-07-20 LAB — POCT INR: INR: 4.4

## 2014-07-20 NOTE — Patient Instructions (Signed)
Patient instructed to take medications as defined in the Anti-coagulation Track section of this encounter.  Patient instructed to take today's dose.  Patient verbalized understanding of these instructions.    

## 2014-07-20 NOTE — Progress Notes (Signed)
Indication: History of deep venous thrombosis Duration: Chronic per patient's informed choice INR: Above target. Dr. Gladstone Pih assessment and plan were reviewed and I agree with his documentation.

## 2014-07-20 NOTE — Progress Notes (Signed)
Anti-Coagulation Progress Note  [name] is a 53 y.o. male who is currently on an anti-coagulation regimen.    RECENT RESULTS: Recent results are below, the most recent result is correlated with a dose of 32.5 mg. per week--decreasing to 27.5mg /wk: Lab Results  Component Value Date   INR 4.40 07/20/2014   INR 3.9 06/29/2014   INR 4.80 06/08/2014   PROTIME 38.4* 04/01/2012    ANTI-COAG DOSE: Anticoagulation Dose Instructions as of 07/20/2014     [name] Tue Wed Thu Fri Sat   New Dose 5 mg 2.5 mg 5 mg 2.5 mg 5 mg 2.5 mg 5 mg       ANTICOAG SUMMARY: Anticoagulation Episode Summary   Current INR goal 2.0-3.0  Next INR check 08/03/2014  INR from last check 4.40! (07/20/2014)  Weekly max dose   Target end date Indefinite  [facility]  Preferred lab   Send INR reminders to ANTICOAG IMP   Indications  EMBOLISM AND THROMBOSIS ARTERY [444.9] Long term current use of anticoagulant [V58.61]        Comments       Anticoagulation Care Providers   Provider Role Specialty Phone number   Michel Bickers, MD  Infectious Diseases [phone]      ANTICOAG TODAY: Anticoagulation Summary as of 07/20/2014   INR goal 2.0-3.0  Selected INR 4.40! (07/20/2014)  Next INR check 08/03/2014  Target end date Indefinite   Indications  EMBOLISM AND THROMBOSIS ARTERY [444.9] Long term current use of anticoagulant [V58.61]      Anticoagulation Episode Summary   [facility]   Preferred lab    Send INR reminders to ANTICOAG IMP   Comments     Anticoagulation Care Providers   Provider Role Specialty Phone number   Michel Bickers, MD  Infectious Diseases [phone]      PATIENT INSTRUCTIONS: Patient Instructions  Patient instructed to take medications as defined in the Anti-coagulation Track section of this encounter.  Patient instructed to take today's dose.  Patient verbalized understanding of these instructions.       FOLLOW-UP Return  in 2 weeks (on 08/03/2014) for Follow up INR at [place], [name] Pharm.D., CACP

## 2014-07-21 ENCOUNTER — Other Ambulatory Visit: Payer: Self-pay | Admitting: Licensed Clinical Social Worker

## 2014-07-21 DIAGNOSIS — G546 Phantom limb syndrome with pain: Secondary | ICD-10-CM

## 2014-07-21 DIAGNOSIS — G8929 Other chronic pain: Secondary | ICD-10-CM

## 2014-07-21 MED ORDER — MORPHINE SULFATE ER 60 MG PO TBCR: 120.0000 mg | EXTENDED_RELEASE_TABLET | Freq: Two times a day (BID) | ORAL | Status: DC

## 2014-07-21 MED ORDER — OXYCODONE HCL 5 MG PO TABS: 5.0000 mg | ORAL_TABLET | Freq: Two times a day (BID) | ORAL | Status: DC

## 2014-08-03 ENCOUNTER — Ambulatory Visit (INDEPENDENT_AMBULATORY_CARE_PROVIDER_SITE_OTHER): Payer: Medicare Other | Admitting: Pharmacist

## 2014-08-03 DIAGNOSIS — Z86718 Personal history of other venous thrombosis and embolism: Secondary | ICD-10-CM

## 2014-08-03 DIAGNOSIS — Z7901 Long term (current) use of anticoagulants: Secondary | ICD-10-CM

## 2014-08-03 LAB — POCT INR: INR: 2.5

## 2014-08-03 NOTE — Progress Notes (Signed)
Indication: History of deep venous thrombosis Duration: Chronic per patient's informed choice INR: At target. Dr. Aquilla Solian assessment and plan were reviewed and I agree with his documentation.

## 2014-08-03 NOTE — Progress Notes (Signed)
[name] is a 53 y.o. male who is currently on an anti-coagulation regimen.    RECENT RESULTS: Recent results are below, the most recent result is correlated with a dose of 27.5 mg. per week: Lab Results  Component Value Date   INR 2.5 08/03/2014   INR 4.40 07/20/2014   INR 3.9 06/29/2014   PROTIME 38.4* 04/01/2012    ANTI-COAG DOSE: Anticoagulation Dose Instructions as of 08/03/2014     [name] Tue Wed Thu Fri Sat   New Dose 5 mg 2.5 mg 5 mg 2.5 mg 5 mg 2.5 mg 5 mg       ANTICOAG SUMMARY: Anticoagulation Episode Summary   Current INR goal 2.0-3.0  Next INR check 08/31/2014  INR from last check 2.5 (08/03/2014)  Weekly max dose   Target end date Indefinite  [facility]  Preferred lab   Send INR reminders to ANTICOAG IMP   Indications  EMBOLISM AND THROMBOSIS ARTERY [I74.9] Long term current use of anticoagulant [Z79.01]        Comments       Anticoagulation Care Providers   Provider Role Specialty Phone number   Michel Bickers, MD  Infectious Diseases [phone]      ANTICOAG TODAY: Anticoagulation Summary as of 08/03/2014   INR goal 2.0-3.0  Selected INR 2.5 (08/03/2014)  Next INR check 08/31/2014  Target end date Indefinite   Indications  EMBOLISM AND THROMBOSIS ARTERY [I74.9] Long term current use of anticoagulant [Z79.01]      Anticoagulation Episode Summary   [facility]   Preferred lab    Send INR reminders to ANTICOAG IMP   Comments     Anticoagulation Care Providers   Provider Role Specialty Phone number   Michel Bickers, MD  Infectious Diseases [phone]      PATIENT INSTRUCTIONS: Patient Instructions  Patient instructed to take medications as defined in the Anti-coagulation Track section of this encounter.  Patient instructed to take today's dose.  Patient verbalized understanding of these instructions.    FOLLOW-UP Return in 4 weeks (on 08/31/2014) for Follow up INR at 2:00.  Elenor Quinones, PharmD PGY1 Pharmacy Resident

## 2014-08-03 NOTE — Patient Instructions (Signed)
Patient instructed to take medications as defined in the Anti-coagulation Track section of this encounter.  Patient instructed to take today's dose.  Patient verbalized understanding of these instructions.    

## 2014-08-18 ENCOUNTER — Other Ambulatory Visit: Payer: Self-pay | Admitting: *Deleted

## 2014-08-18 DIAGNOSIS — G8929 Other chronic pain: Secondary | ICD-10-CM

## 2014-08-18 DIAGNOSIS — G546 Phantom limb syndrome with pain: Secondary | ICD-10-CM

## 2014-08-18 MED ORDER — OXYCODONE HCL 5 MG PO TABS: 5.0000 mg | ORAL_TABLET | Freq: Two times a day (BID) | ORAL | Status: DC

## 2014-08-18 MED ORDER — MORPHINE SULFATE ER 60 MG PO TBCR: 120.0000 mg | EXTENDED_RELEASE_TABLET | Freq: Two times a day (BID) | ORAL | Status: DC

## 2014-08-18 NOTE — Telephone Encounter (Signed)
Placed rxes in Dr. Hale Bogus box for signature.  Pt will pick up on Friday, Oct. 23. 2015.  Dropping off Programmer, multimedia for MD signature.  Would like this mailed to him once it is signed.

## 2014-08-21 ENCOUNTER — Telehealth: Payer: Self-pay | Admitting: Licensed Clinical Social Worker

## 2014-08-21 NOTE — Telephone Encounter (Signed)
Patient dropped off a form for a permanent placard for handicap parking, I received it and placed it in Dr. Hale Bogus box in triage.

## 2014-08-26 ENCOUNTER — Telehealth: Payer: Self-pay | Admitting: *Deleted

## 2014-08-26 NOTE — Telephone Encounter (Signed)
Called patient to let him know the handicapped parking form had been completed. Patient stated he will be in [facility] Monday, would like to come here that day to pick up his application and get 6 month labs at the same time. Gave 6 month appointments, made copy of form for pt's chart, left original in envelope at front. [name], RN

## 2014-08-31 ENCOUNTER — Other Ambulatory Visit: Payer: Medicare Other

## 2014-08-31 ENCOUNTER — Ambulatory Visit (INDEPENDENT_AMBULATORY_CARE_PROVIDER_SITE_OTHER): Payer: Medicare Other | Admitting: Pharmacist

## 2014-08-31 DIAGNOSIS — Z79899 Other long term (current) drug therapy: Secondary | ICD-10-CM | POA: Diagnosis not present

## 2014-08-31 DIAGNOSIS — Z113 Encounter for screening for infections with a predominantly sexual mode of transmission: Secondary | ICD-10-CM

## 2014-08-31 DIAGNOSIS — Z7901 Long term (current) use of anticoagulants: Secondary | ICD-10-CM | POA: Diagnosis not present

## 2014-08-31 DIAGNOSIS — B20 Human immunodeficiency virus [HIV] disease: Secondary | ICD-10-CM | POA: Diagnosis not present

## 2014-08-31 DIAGNOSIS — Z86718 Personal history of other venous thrombosis and embolism: Secondary | ICD-10-CM | POA: Diagnosis not present

## 2014-08-31 LAB — COMPREHENSIVE METABOLIC PANEL
ALT: 25 U/L (ref 0–53)
AST: 34 U/L (ref 0–37)
Albumin: 4.1 g/dL (ref 3.5–5.2)
Alkaline Phosphatase: 85 U/L (ref 39–117)
BUN: 5 mg/dL — AB (ref 6–23)
CALCIUM: 8.6 mg/dL (ref 8.4–10.5)
CHLORIDE: 100 meq/L (ref 96–112)
CO2: 22 mEq/L (ref 19–32)
CREATININE: 1.42 mg/dL — AB (ref 0.50–1.35)
GLUCOSE: 101 mg/dL — AB (ref 70–99)
Potassium: 2.8 mEq/L — ABNORMAL LOW (ref 3.5–5.3)
Sodium: 133 mEq/L — ABNORMAL LOW (ref 135–145)
Total Bilirubin: 1.5 mg/dL — ABNORMAL HIGH (ref 0.2–1.2)
Total Protein: 7.2 g/dL (ref 6.0–8.3)

## 2014-08-31 LAB — LIPID PANEL
CHOL/HDL RATIO: 3.7 ratio
CHOLESTEROL: 146 mg/dL (ref 0–200)
HDL: 39 mg/dL — AB (ref >39)
LDL Cholesterol: 89 mg/dL (ref 0–99)
TRIGLYCERIDES: 89 mg/dL (ref <150)
VLDL: 18 mg/dL (ref 0–40)

## 2014-08-31 LAB — CBC
HEMATOCRIT: 39.3 % (ref 39.0–52.0)
HEMOGLOBIN: 13.6 g/dL (ref 13.0–17.0)
MCH: 30.9 pg (ref 26.0–34.0)
MCHC: 34.6 g/dL (ref 30.0–36.0)
MCV: 89.3 fL (ref 78.0–100.0)
Platelets: 498 10*3/uL — ABNORMAL HIGH (ref 150–400)
RBC: 4.4 MIL/uL (ref 4.22–5.81)
RDW: 15.8 % — AB (ref 11.5–15.5)
WBC: 7.6 10*3/uL (ref 4.0–10.5)

## 2014-08-31 LAB — POCT INR: INR: 3.7

## 2014-08-31 NOTE — Progress Notes (Signed)
Anti-Coagulation Progress Note  [name] is a 53 y.o. male who is currently on an anti-coagulation regimen.    RECENT RESULTS: Recent results are below, the most recent result is correlated with a dose of 27.5 mg. per week: Lab Results  Component Value Date   INR 3.70 08/31/2014   INR 2.5 08/03/2014   INR 4.40 07/20/2014   PROTIME 38.4* 04/01/2012    ANTI-COAG DOSE: Anticoagulation Dose Instructions as of 08/31/2014      [name] Tue Wed Thu Fri Sat   New Dose 5 mg 2.5 mg 2.5 mg 2.5 mg 5 mg 2.5 mg 5 mg       ANTICOAG SUMMARY: Anticoagulation Episode Summary    Current INR goal 2.0-3.0  Next INR check 09/21/2014  INR from last check 3.70! (08/31/2014)  Weekly max dose   Target end date Indefinite  [facility]  Preferred lab   Send INR reminders to ANTICOAG IMP   Indications  EMBOLISM AND THROMBOSIS ARTERY [I74.9] Long term current use of anticoagulant [Z79.01]        Comments       Anticoagulation Care Providers    Provider Role Specialty Phone number   Michel Bickers, MD  Infectious Diseases [phone]      ANTICOAG TODAY: Anticoagulation Summary as of 08/31/2014    INR goal 2.0-3.0  Selected INR 3.70! (08/31/2014)  Next INR check 09/21/2014  Target end date Indefinite   Indications  EMBOLISM AND THROMBOSIS ARTERY [I74.9] Long term current use of anticoagulant [Z79.01]      Anticoagulation Episode Summary    [facility]   Preferred lab    Send INR reminders to ANTICOAG IMP   Comments     Anticoagulation Care Providers    Provider Role Specialty Phone number   Michel Bickers, MD  Infectious Diseases [phone]      PATIENT INSTRUCTIONS: Patient Instructions  Patient instructed to take medications as defined in the Anti-coagulation Track section of this encounter.  Patient instructed to take today's dose.  Patient verbalized understanding of these instructions.       FOLLOW-UP Return in 3 weeks  (on 09/21/2014) for Follow up INR at [place], [name] Pharm.D., CACP

## 2014-08-31 NOTE — Patient Instructions (Signed)
Patient instructed to take medications as defined in the Anti-coagulation Track section of this encounter.  Patient instructed to take today's dose.  Patient verbalized understanding of these instructions.    

## 2014-09-01 LAB — HIV-1 RNA QUANT-NO REFLEX-BLD
HIV 1 RNA Quant: <20 copies/mL (ref <20)
HIV-1 RNA Quant, Log: <1.3 {Log} (ref <1.30)

## 2014-09-01 LAB — T-HELPER CELL (CD4) - (RCID CLINIC ONLY)
CD4 % Helper T Cell: 22 % — ABNORMAL LOW (ref 33–55)
CD4 T Cell Abs: 710 /uL (ref 400–2700)

## 2014-09-01 LAB — RPR

## 2014-09-01 NOTE — Progress Notes (Signed)
Patient on anticoagulation for arterial embolism.  INR is elevated, new dose of coumadin noted.  I have reviewed Dr. Gladstone Pih note.

## 2014-09-03 ENCOUNTER — Other Ambulatory Visit: Payer: Self-pay | Admitting: *Deleted

## 2014-09-03 DIAGNOSIS — R634 Abnormal weight loss: Secondary | ICD-10-CM

## 2014-09-03 MED ORDER — ENSURE PO LIQD: 1.0000 | Freq: Three times a day (TID) | ORAL | Status: DC

## 2014-09-09 ENCOUNTER — Other Ambulatory Visit: Payer: Self-pay | Admitting: Internal Medicine

## 2014-09-09 DIAGNOSIS — E876 Hypokalemia: Secondary | ICD-10-CM

## 2014-09-09 NOTE — Progress Notes (Signed)
Patient ID: [name], male   DOB: 07-Apr-1961, 53 y.o.   MRN: [id number]  Please call [facility] and have him come in for a repeat basic metabolic panel because his recent potassium was 2.8.

## 2014-09-15 ENCOUNTER — Encounter: Payer: Self-pay | Admitting: Internal Medicine

## 2014-09-15 ENCOUNTER — Ambulatory Visit (INDEPENDENT_AMBULATORY_CARE_PROVIDER_SITE_OTHER): Payer: Medicare Other | Admitting: Internal Medicine

## 2014-09-15 VITALS — BP 158/83 | HR 89 | Temp 98.7°F | Wt 133.0 lb

## 2014-09-15 DIAGNOSIS — Z23 Encounter for immunization: Secondary | ICD-10-CM | POA: Diagnosis not present

## 2014-09-15 DIAGNOSIS — I749 Embolism and thrombosis of unspecified artery: Secondary | ICD-10-CM

## 2014-09-15 DIAGNOSIS — Z79899 Other long term (current) drug therapy: Secondary | ICD-10-CM

## 2014-09-15 DIAGNOSIS — G546 Phantom limb syndrome with pain: Secondary | ICD-10-CM

## 2014-09-15 DIAGNOSIS — G8929 Other chronic pain: Secondary | ICD-10-CM

## 2014-09-15 MED ORDER — MORPHINE SULFATE ER 60 MG PO TBCR: 120.0000 mg | EXTENDED_RELEASE_TABLET | Freq: Two times a day (BID) | ORAL | Status: DC

## 2014-09-15 MED ORDER — OXYCODONE HCL 5 MG PO TABS
5.0000 mg | ORAL_TABLET | Freq: Two times a day (BID) | ORAL | Status: DC
Start: 2014-09-15 — End: 2014-09-15

## 2014-09-15 MED ORDER — OXYCODONE HCL 5 MG PO TABS: 5.0000 mg | ORAL_TABLET | Freq: Four times a day (QID) | ORAL | Status: DC | PRN

## 2014-09-15 NOTE — Progress Notes (Signed)
Patient ID: [name], male   DOB: [birth year], 53 y.o.   MRN: [id number]          Patient Active Problem List   Diagnosis Date Noted  . Phantom limb syndrome with pain 03/21/2012    Priority: High  . Long term current use of anticoagulant 11/20/2010    Priority: High  . EMBOLISM AND THROMBOSIS, ARTERY 07/19/2009    Priority: High  . Human immunodeficiency virus (HIV) disease 11/10/2006    Priority: High  . DYSLIPIDEMIA 11/10/2006    Priority: High  . CIGARETTE SMOKER 11/10/2006    Priority: High  . Acute renal insufficiency 02/10/2014    Priority: Medium  . Hyperglycemia 12/29/2013    Priority: Medium  . Unintentional weight loss 11/04/2013    Priority: Medium  . Hyperbilirubinemia 12/29/2013  . Left hip pain 03/09/2013  . Preventative health care 03/09/2013  . History of DVT of lower extremity   . ERECTILE DYSFUNCTION, ORGANIC 04/12/2010  . GERD 08/23/2009  . METHICILLIN RESISTANT STAPHYLOCOCCUS AUREUS INFECTION 08/05/2009  . ENLARGEMENT OF LYMPH NODES 07/20/2009  . HYPOGONADISM 07/15/2009  . DEFICIENCY OF OTHER VITAMINS 07/15/2009  . PULMONARY NODULE 07/15/2009  . AKA, RIGHT, HX OF 07/15/2009  . SHINGLES, RECURRENT 11/10/2006  . HEPATITIS B 11/10/2006  . PSYCHOSIS 11/10/2006  . ALCOHOL ABUSE 11/10/2006  . PERIPHERAL NEUROPATHY 11/10/2006  . ALLERGIC RHINITIS 11/10/2006  . DENTAL CARIES 11/10/2006  . APPENDECTOMY, HX OF 11/10/2006    Patient's Medications  New Prescriptions   No medications on file  Previous Medications   ASPIRIN 81 MG TABLET    Take 81 mg by mouth daily.     ATAZANAVIR-COBICISTAT (EVOTAZ) 300-150 MG PER TABLET    Take 1 tablet by mouth daily. Swallow whole. Do NOT crush, cut or chew tablet. Take with food.   DOLUTEGRAVIR ([name]) 50 MG TABLET    Take 1 tablet (50 mg total) by mouth daily.   ENSURE (ENSURE)    Take 1 Can by mouth 3 (three) times daily between meals.   FENOFIBRATE (TRICOR) 145 MG TABLET    TAKE 1 TABLET BY MOUTH EVERY DAY     GABAPENTIN (NEURONTIN) 400 MG CAPSULE    TAKE ONE CAPSULE BY MOUTH FOUR TIMES DAILY   TENOFOVIR ([name]) 300 MG TABLET    Take 1 tablet (300 mg total) by mouth daily.   WARFARIN (COUMADIN) 5 MG TABLET    Take as directed by anticoagulation clinic provider.  Modified Medications   Modified Medication Previous Medication   MORPHINE (MS CONTIN) 60 MG 12 HR TABLET morphine (MS CONTIN) 60 MG 12 hr tablet      Take 2 tablets (120 mg total) by mouth every 12 (twelve) hours.    Take 2 tablets (120 mg total) by mouth every 12 (twelve) hours.   OXYCODONE (OXY IR/ROXICODONE) 5 MG IMMEDIATE RELEASE TABLET oxyCODONE (OXY IR/ROXICODONE) 5 MG immediate release tablet      Take 1 tablet (5 mg total) by mouth 2 (two) times daily.    Take 1 tablet (5 mg total) by mouth 2 (two) times daily.  Discontinued Medications   No medications on file    Subjective: [name] is in for his routine visit. He never misses any doses of his medications. He now tells me that he takes his oxycodone and MS Contin at the same time twice daily. He has not been using the oxycodone for breakthrough pain. His pain is well controlled. Review of Systems: Pertinent items are noted in HPI.  Past Medical History  Diagnosis Date  . History of chicken pox   . Hyperlipidemia   . History of DVT of lower extremity     right  . Left hip pain 03/09/2013  . Preventative health care 03/09/2013    History  Substance Use Topics  . Smoking status: Current Every Day Smoker -- 0.50 packs/day for 36 years    Types: Cigarettes  . Smokeless tobacco: Never Used  . Alcohol Use: No    Family History  Problem Relation Age of Onset  . Arthritis Mother   . Kidney disease Maternal Grandfather     Allergies  Allergen Reactions  . Dapsone     REACTION: fever  . Sulfamethoxazole-Trimethoprim     REACTION: fever    Objective: Temp: 98.7 F (37.1 C) (11/17 1013) Temp Source: Oral (11/17 1013) BP: 158/83 mmHg (11/17 1013) Pulse Rate: 89  (11/17 1013) Body mass index is 18.03 kg/(m^2).  General: he is in good spirits Oral: no oropharyngeal lesions Skin: no rash Lungs: clear Cor: regular S1 and S2 with no murmurs  Lab Results Lab Results  Component Value Date   WBC 7.6 08/31/2014   HGB 13.6 08/31/2014   HCT 39.3 08/31/2014   MCV 89.3 08/31/2014   PLT 498* 08/31/2014    Lab Results  Component Value Date   CREATININE 1.42* 08/31/2014   BUN 5* 08/31/2014   NA 133* 08/31/2014   K 2.8* 08/31/2014   CL 100 08/31/2014   CO2 22 08/31/2014    Lab Results  Component Value Date   ALT 25 08/31/2014   AST 34 08/31/2014   ALKPHOS 85 08/31/2014   BILITOT 1.5* 08/31/2014    Lab Results  Component Value Date   CHOL 146 08/31/2014   HDL 39* 08/31/2014   LDLCALC 89 08/31/2014   TRIG 89 08/31/2014   CHOLHDL 3.7 08/31/2014    Lab Results HIV 1 RNA QUANT (copies/mL)  Date Value  08/31/2014 <20  04/28/2014 <20  01/23/2014 <20   CD4 T CELL ABS (/uL)  Date Value  08/31/2014 710  04/28/2014 840  01/23/2014 780     Assessment: His HIV infection remains under excellent control.  We have changed his oxycodone prescription to show it for breakthrough pain as needed.  Plan: 1. Continue current antiretroviral regimen 2. Use oxycodone for breakthrough pain 3. Follow-up after lab work in [place] months   Michel Bickers, MD [facility] for [place] [phone] pager   [phone] cell 09/15/2014, 10:34 AM

## 2014-09-21 ENCOUNTER — Ambulatory Visit (INDEPENDENT_AMBULATORY_CARE_PROVIDER_SITE_OTHER): Payer: Medicare Other | Admitting: Pharmacist

## 2014-09-21 DIAGNOSIS — I749 Embolism and thrombosis of unspecified artery: Secondary | ICD-10-CM

## 2014-09-21 DIAGNOSIS — Z7901 Long term (current) use of anticoagulants: Secondary | ICD-10-CM | POA: Diagnosis not present

## 2014-09-21 LAB — POCT INR: INR: 3.3

## 2014-09-21 NOTE — Patient Instructions (Signed)
Patient instructed to take medications as defined in the Anti-coagulation Track section of this encounter.  Patient instructed to take today's dose.  Patient verbalized understanding of these instructions.    

## 2014-09-21 NOTE — Progress Notes (Signed)
[name] is a 53 y.o. male who is currently on an anti-coagulation regimen.    RECENT RESULTS: Recent results are below, the most recent result is correlated with a dose of 25 mg. per week: Lab Results  Component Value Date   INR 3.3 09/21/2014   INR 3.70 08/31/2014   INR 2.5 08/03/2014   PROTIME 38.4* 04/01/2012    ANTI-COAG DOSE: Anticoagulation Dose Instructions as of 09/21/2014      [name] Tue Wed Thu Fri Sat   New Dose 5 mg 2.5 mg 2.5 mg 2.5 mg 5 mg 2.5 mg 2.5 mg       ANTICOAG SUMMARY: Anticoagulation Episode Summary    Current INR goal 2.0-3.0  Next INR check 10/12/2014  INR from last check 3.3! (09/21/2014)  Weekly max dose   Target end date Indefinite  [facility]  Preferred lab   Send INR reminders to ANTICOAG IMP   Indications  EMBOLISM AND THROMBOSIS ARTERY [I74.9] Long term current use of anticoagulant [Z79.01]        Comments       Anticoagulation Care Providers    Provider Role Specialty Phone number   Michel Bickers, MD  Infectious Diseases [phone]      ANTICOAG TODAY: Anticoagulation Summary as of 09/21/2014    INR goal 2.0-3.0  Selected INR 3.3! (09/21/2014)  Next INR check 10/12/2014  Target end date Indefinite   Indications  EMBOLISM AND THROMBOSIS ARTERY [I74.9] Long term current use of anticoagulant [Z79.01]      Anticoagulation Episode Summary    [facility]   Preferred lab    Send INR reminders to ANTICOAG IMP   Comments     Anticoagulation Care Providers    Provider Role Specialty Phone number   Michel Bickers, MD  Infectious Diseases [phone]      PATIENT INSTRUCTIONS: Patient Instructions  Patient instructed to take medications as defined in the Anti-coagulation Track section of this encounter.  Patient instructed to take today's dose.  Patient verbalized understanding of these instructions.     FOLLOW-UP Return in 3 weeks (on 10/12/2014) for Follow up INR  at 2:00pm.  Elenor Quinones, PharmD PGY1 Pharmacy Resident

## 2014-10-02 ENCOUNTER — Other Ambulatory Visit: Payer: Self-pay | Admitting: Internal Medicine

## 2014-10-08 ENCOUNTER — Ambulatory Visit (INDEPENDENT_AMBULATORY_CARE_PROVIDER_SITE_OTHER): Payer: Medicare Other | Admitting: Internal Medicine

## 2014-10-08 ENCOUNTER — Encounter: Payer: Self-pay | Admitting: Internal Medicine

## 2014-10-08 VITALS — BP 120/63 | HR 97 | Temp 99.0°F | Wt 127.1 lb

## 2014-10-08 DIAGNOSIS — R634 Abnormal weight loss: Secondary | ICD-10-CM | POA: Diagnosis not present

## 2014-10-08 DIAGNOSIS — Z720 Tobacco use: Secondary | ICD-10-CM | POA: Diagnosis not present

## 2014-10-08 DIAGNOSIS — E876 Hypokalemia: Secondary | ICD-10-CM | POA: Diagnosis not present

## 2014-10-08 DIAGNOSIS — Z23 Encounter for immunization: Secondary | ICD-10-CM

## 2014-10-08 DIAGNOSIS — Z0000 Encounter for general adult medical examination without abnormal findings: Secondary | ICD-10-CM

## 2014-10-08 DIAGNOSIS — Z7901 Long term (current) use of anticoagulants: Secondary | ICD-10-CM

## 2014-10-08 DIAGNOSIS — F17200 Nicotine dependence, unspecified, uncomplicated: Secondary | ICD-10-CM

## 2014-10-08 LAB — BASIC METABOLIC PANEL WITH GFR
BUN: 6 mg/dL (ref 6–23)
CALCIUM: 9.1 mg/dL (ref 8.4–10.5)
CHLORIDE: 95 meq/L — AB (ref 96–112)
CO2: 24 mEq/L (ref 19–32)
CREATININE: 1.23 mg/dL (ref 0.50–1.35)
GFR, Est African American: 77 mL/min
GFR, Est Non African American: 67 mL/min
Glucose, Bld: 138 mg/dL — ABNORMAL HIGH (ref 70–99)
Potassium: 2.6 mEq/L — CL (ref 3.5–5.3)
SODIUM: 133 meq/L — AB (ref 135–145)

## 2014-10-08 MED ORDER — POTASSIUM CHLORIDE ER 20 MEQ PO TBCR: 20.0000 meq | EXTENDED_RELEASE_TABLET | Freq: Every day | ORAL | Status: DC

## 2014-10-08 MED ORDER — POTASSIUM CHLORIDE CRYS ER 20 MEQ PO TBCR
40.0000 meq | EXTENDED_RELEASE_TABLET | Freq: Once | ORAL | Status: AC
  Administered 2014-10-08: 40 meq via ORAL

## 2014-10-08 NOTE — Assessment & Plan Note (Addendum)
K 2.8 08/31/14 on lab from Dr. Hale Bogus office. Pt not on diuretic and is not on potassium supplementation.  - Checking BMP today to assess K --> K 2.6 today. Pt given 6mEq KCl in the clinic.  - Starting 76mEq po daily - Pt to f/u in 1 week but coming back for INR check on Monday, so will recheck K then.  - Encouraged increased vegetable (greens) consumption

## 2014-10-08 NOTE — Assessment & Plan Note (Signed)
  Assessment: Progress toward smoking cessation:  smoking the same amount (14-15 cig/day) Barriers to progress toward smoking cessation:  withdrawal symptoms Comments: Pt not willing to quit yet  Plan: Instruction/counseling given:  I counseled patient on the dangers of tobacco use, advised patient to stop smoking, and reviewed strategies to maximize success.

## 2014-10-08 NOTE — Patient Instructions (Addendum)
**Begin taking 15mEq (1 tablet) once a day starting tomorrow morning.  **Return to the clinic in next Wednesday for a recheck of your potassium.    General Instructions:   Please bring your medicines with you each time you come to clinic.  Medicines may include prescription medications, over-the-counter medications, herbal remedies, eye drops, vitamins, or other pills.   Progress Toward Treatment Goals:  Treatment Goal 10/08/2014  Stop smoking smoking the same amount    Self Care Goals & Plans:  Self Care Goal 04/15/2014  Manage my medications take my medicines as prescribed; refill my medications on time  Monitor my health -  Eat healthy foods -  Be physically active find an activity I enjoy  Stop smoking -    No flowsheet data found.   Care Management & Community Referrals:  No flowsheet data found.      Potassium Salts tablets, extended-release tablets or capsules What is this medicine? POTASSIUM (poe TASS i um) is a natural salt that is important for the heart, muscles, and nerves. It is found in many foods and is normally supplied by a well balanced diet. This medicine is used to treat low potassium. This medicine may be used for other purposes; ask your health care provider or pharmacist if you have questions. COMMON BRAND NAME(S): ED-K+10, Glu-K, K-10, K-8, K-Dur, K-Tab, Kaon-CL, Klor-Con, Klor-Con M10, Klor-Con M15, Klor-Con M20, Klotrix, Micro-K, Micro-K Extencaps, Slow-K What should I tell my health care provider before I take this medicine? They need to know if you have any of these conditions: -dehydration -diabetes -irregular heartbeat -kidney disease -stomach ulcers or other stomach problems -an unusual or allergic reaction to potassium salts, other medicines, foods, dyes, or preservatives -pregnant or trying to get pregnant -breast-feeding How should I use this medicine? Take this medicine by mouth with a full glass of water. Follow the directions on  the prescription label. Take with food. Do not suck on, crush, or chew this medicine. If you have difficulty swallowing, ask the pharmacist how to take. Take your medicine at regular intervals. Do not take it more often than directed. Do not stop taking except on your doctor's advice. Talk to your pediatrician regarding the use of this medicine in children. Special care may be needed. Overdosage: If you think you have taken too much of this medicine contact a poison control center or emergency room at once. NOTE: This medicine is only for you. Do not share this medicine with others. What if I miss a dose? If you miss a dose, take it as soon as you can. If it is almost time for your next dose, take only that dose. Do not take double or extra doses. What may interact with this medicine? Do not take this medicine with any of the following medications: -eplerenone -sodium polystyrene sulfonate This medicine may also interact with the following medications: -medicines for blood pressure or heart disease like lisinopril, losartan, quinapril, valsartan -medicines for cold or allergies -medicines for inflammation like ibuprofen, indomethacin -medicines for Parkinson's disease -medicines for the stomach like metoclopramide, dicyclomine, glycopyrrolate -some diuretics This list may not describe all possible interactions. Give your health care provider a list of all the medicines, herbs, non-prescription drugs, or dietary supplements you use. Also tell them if you smoke, drink alcohol, or use illegal drugs. Some items may interact with your medicine. What should I watch for while using this medicine? Visit your doctor or health care professional for regular check ups. You will need lab  work done regularly. You may need to be on a special diet while taking this medicine. Ask your doctor. What side effects may I notice from receiving this medicine? Side effects that you should report to your doctor or  health care professional as soon as possible: -allergic reactions like skin rash, itching or hives, swelling of the face, lips, or tongue -black, tarry stools -heartburn -irregular heartbeat -numbness or tingling in hands or feet -pain when swallowing -unusually weak or tired Side effects that usually do not require medical attention (report to your doctor or health care professional if they continue or are bothersome): -diarrhea -nausea -stomach gas -vomiting This list may not describe all possible side effects. Call your doctor for medical advice about side effects. You may report side effects to FDA at 1-800-FDA-1088. Where should I keep my medicine? Keep out of the reach of children. Store at room temperature between 15 and 30 degrees C (59 and 86 degrees F ). Keep bottle closed tightly to protect this medicine from light and moisture. Throw away any unused medicine after the expiration date. NOTE: This sheet is a summary. It may not cover all possible information. If you have questions about this medicine, talk to your doctor, pharmacist, or health care provider.  2015, Elsevier/Gold Standard. (2008-01-01 11:17:31)

## 2014-10-08 NOTE — Assessment & Plan Note (Signed)
Tdap given today.

## 2014-10-08 NOTE — Assessment & Plan Note (Signed)
Pt down 6 lbs from his visit in Nov. Unsure if this weight is correct. He states that he is eating 2 meals a day and is drinking Ensure. - Encouraged eating breakfast in addition to lunch and dinner. - Encouraged eating more vegetables.  - Pt to f/u in 4 days and will recheck weight then

## 2014-10-08 NOTE — Assessment & Plan Note (Addendum)
Pt w/ h/o BLE DVT resulting in RLE ischemia and subsequent AKA. No LLE edema or pain on exam today. Pt compliant with Coumadin. Last INR 11/23 was slightly supratherapeutic at 3.3. He is due in the Coumadin clinic on 10/12/14. Continuing Coumadin per Dr. Elie Confer.

## 2014-10-08 NOTE — Progress Notes (Signed)
Patient ID: [name], male   DOB: 12/05/1960, 53 y.o.   MRN: [id number]  Subjective:   Patient ID: [name] male   DOB: [birth year] 53 y.o.   MRN: [id number]  HPI: [name] is a 53 y.o. M w/ PMH HIV (CD4 710, VL <20 on 08/31/14), Hep B, EtOH abuse, tobacco abuse, h/o DVT on Coumadin, and CKD who presents for a routine f/u.  He was seen by Dr. Megan Salon on 09/15/14 and was doing well. HIV is well controlled. Labs were drawn on 08/31/14 and his potassium was 2.8 at that time and his NA was 133 with increased Cr to 1.42 which is around what it was in March, but has been increasing over the past few years. His blood pressure was elevated at his visit with Dr. Megan Salon, but has previously been in the normal range.  BP today is well controlled.  He states that he is eating really well. He does not eat breakfast usually but is eating a sandwich for lunch and a big dinner and an Ensure shake for dessert. His weight is down today 6lbs from his visit with Dr. Megan Salon 09/12/14. Pt states that he is eating really well and there is no way he could have lost weight since then.    Past Medical History  Diagnosis Date  . History of chicken pox   . Hyperlipidemia   . History of DVT of lower extremity     right  . Left hip pain 03/09/2013  . Preventative health care 03/09/2013   Current Outpatient Prescriptions  Medication Sig Dispense Refill  . aspirin 81 MG tablet Take 81 mg by mouth daily.      Marland Kitchen atazanavir-cobicistat (EVOTAZ) 300-150 MG per tablet Take 1 tablet by mouth daily. Swallow whole. Do NOT crush, cut or chew tablet. Take with food. 30 tablet 11  . dolutegravir ([name]) 50 MG tablet Take 1 tablet (50 mg total) by mouth daily. 30 tablet 11  . ENSURE (ENSURE) Take 1 Can by mouth 3 (three) times daily between meals. 237 mL prn  . fenofibrate (TRICOR) 145 MG tablet TAKE 1 TABLET BY MOUTH EVERY DAY 30 tablet 3  . gabapentin (NEURONTIN) 400 MG capsule TAKE ONE CAPSULE BY MOUTH FOUR TIMES  DAILY 120 capsule 11  . morphine (MS CONTIN) 60 MG 12 hr tablet Take 2 tablets (120 mg total) by mouth every 12 (twelve) hours. 120 tablet 0  . oxyCODONE (OXY IR/ROXICODONE) 5 MG immediate release tablet Take 1 tablet (5 mg total) by mouth every 6 (six) hours as needed for severe pain. 60 tablet 0  . tenofovir ([name]) 300 MG tablet Take 1 tablet (300 mg total) by mouth daily. 30 tablet 11  . warfarin (COUMADIN) 5 MG tablet Take as directed by anticoagulation clinic provider. 90 tablet 3   No current facility-administered medications for this visit.   Family History  Problem Relation Age of Onset  . Arthritis Mother   . Kidney disease Maternal Grandfather    History   Social History  . Marital Status: Married    Spouse Name: N/A    Number of Children: 3  . Years of Education: N/A   Occupational History  .     Social History Main Topics  . Smoking status: Current Every Day Smoker -- 0.50 packs/day for 36 years    Types: Cigarettes  . Smokeless tobacco: Never Used  . Alcohol Use: No  . Drug Use: No  . Sexual Activity: Not Currently  Comment: declined condoms   Other Topics Concern  . None   Social History Narrative   Review of Systems: Constitutional: Denies fever, chills, appetite change, or fatigue.  HEENT: Denies congestion, sore throat, rhinorrhea.   Respiratory: Denies SOB, cough, or wheezing.   Cardiovascular: Denies chest pain, palpitations, or leg swelling.  Gastrointestinal: Denies nausea, vomiting, abdominal pain, diarrhea, constipation, Genitourinary: Denies dysuria, urgency Musculoskeletal: Denies myalgias, back pain, joint swelling, arthralgias and gait problem.  Skin: Denies rash and wound.  Neurological: Denies dizziness, syncope, weakness, or headaches.  Psychiatric/Behavioral: Denies suicidal ideation, mood changes, or confusion.   Objective:  Physical Exam: Filed Vitals:   10/08/14 1424  BP: 120/63  Pulse: 97  Temp: 99 F (37.2 C)   TempSrc: Oral  Weight: 127 lb 1.6 oz (57.652 kg)  SpO2: 100%   Constitutional: Vital signs reviewed.  Patient is a thin male in no acute distress and cooperative with exam. Alert and oriented x3.  Head: Normocephalic and atraumatic Eyes: PERRL, EOMI.  Cardiovascular: RRR, no MRG Pulmonary/Chest: Normal respiratory effort, CTAB, no wheezes, rales, or rhonchi Abdominal: Soft. Non-tender, non-distended, bowel sounds are normal, no guarding present.  Musculoskeletal: L AKA. RLE with good ROM Neurological: A&O x3, moves all 4 extremities (including stump) cranial nerve II-XII are grossly intact  Skin: Warm, dry and intact. No swelling or erythema of extremities.   Psychiatric: Normal mood and affect. Speech and behavior is normal.   Assessment & Plan:   Please refer to Problem List based Assessment and Plan

## 2014-10-09 NOTE — Progress Notes (Signed)
Medicine attending: Medical history, presenting problems, physical findings, and medications, reviewed with Dr Lennart Pall and I concur with her evaluation and management plan.

## 2014-10-12 ENCOUNTER — Ambulatory Visit (INDEPENDENT_AMBULATORY_CARE_PROVIDER_SITE_OTHER): Payer: Medicare Other | Admitting: Internal Medicine

## 2014-10-12 ENCOUNTER — Encounter: Payer: Self-pay | Admitting: Internal Medicine

## 2014-10-12 ENCOUNTER — Ambulatory Visit (INDEPENDENT_AMBULATORY_CARE_PROVIDER_SITE_OTHER): Payer: Medicare Other | Admitting: Pharmacist

## 2014-10-12 VITALS — BP 117/70 | HR 95 | Temp 98.2°F | Wt 131.2 lb

## 2014-10-12 DIAGNOSIS — E876 Hypokalemia: Secondary | ICD-10-CM

## 2014-10-12 DIAGNOSIS — I749 Embolism and thrombosis of unspecified artery: Secondary | ICD-10-CM | POA: Diagnosis not present

## 2014-10-12 DIAGNOSIS — Z7901 Long term (current) use of anticoagulants: Secondary | ICD-10-CM | POA: Diagnosis not present

## 2014-10-12 LAB — BASIC METABOLIC PANEL WITH GFR
BUN: 4 mg/dL — ABNORMAL LOW (ref 6–23)
CALCIUM: 8.6 mg/dL (ref 8.4–10.5)
CO2: 20 mEq/L (ref 19–32)
Chloride: 103 mEq/L (ref 96–112)
Creat: 0.98 mg/dL (ref 0.50–1.35)
GFR, Est Non African American: 88 mL/min
Glucose, Bld: 93 mg/dL (ref 70–99)
POTASSIUM: 3.5 meq/L (ref 3.5–5.3)
SODIUM: 135 meq/L (ref 135–145)

## 2014-10-12 LAB — POCT INR: INR: 1.4

## 2014-10-12 NOTE — Patient Instructions (Signed)
Patient instructed to take medications as defined in the Anti-coagulation Track section of this encounter.  Patient instructed to take today's dose.  Patient verbalized understanding of these instructions.    

## 2014-10-12 NOTE — Progress Notes (Signed)
Patient ID: [name], male   DOB: 1960-12-31, 53 y.o.   MRN: [id number]  Subjective:   Patient ID: [name] male   DOB: 04-May-1961 53 y.o.   MRN: [id number]  HPI: [name] is a 53 y.o. M w/ PMH HIV (CD4 710, VL <20 on 08/31/14), Hep B, EtOH abuse, tobacco abuse, h/o DVT on Coumadin, and CKD who presents with hypokalemia.  Labs were obtained 08/31/14 and his potassium was 2.8. He was seen 12/10 in the clinic and his K was 2.6. He was given 67mEq po KCl and was prescribed 11mEq to take at home. He presents today for repeat BMP to assess his K. He denies any chest pain, palpitations, or weakness.  Pt denies fevers, chills, headaches, vision changes, SOB, wheezing, abdominal pain, nausea, vomiting, difficulty or pain with urination, or peripheral edema.   Past Medical History  Diagnosis Date  . History of chicken pox   . Hyperlipidemia   . History of DVT of lower extremity     right  . Left hip pain 03/09/2013  . Preventative health care 03/09/2013   Current Outpatient Prescriptions  Medication Sig Dispense Refill  . aspirin 81 MG tablet Take 81 mg by mouth daily.      Marland Kitchen atazanavir-cobicistat (EVOTAZ) 300-150 MG per tablet Take 1 tablet by mouth daily. Swallow whole. Do NOT crush, cut or chew tablet. Take with food. 30 tablet 11  . dolutegravir ([name]) 50 MG tablet Take 1 tablet (50 mg total) by mouth daily. 30 tablet 11  . ENSURE (ENSURE) Take 1 Can by mouth 3 (three) times daily between meals. 237 mL prn  . fenofibrate (TRICOR) 145 MG tablet TAKE 1 TABLET BY MOUTH EVERY DAY 30 tablet 3  . gabapentin (NEURONTIN) 400 MG capsule TAKE ONE CAPSULE BY MOUTH FOUR TIMES DAILY 120 capsule 11  . morphine (MS CONTIN) 60 MG 12 hr tablet Take 2 tablets (120 mg total) by mouth every 12 (twelve) hours. 120 tablet 0  . oxyCODONE (OXY IR/ROXICODONE) 5 MG immediate release tablet Take 1 tablet (5 mg total) by mouth every 6 (six) hours as needed for severe pain. 60 tablet 0  . potassium chloride  20 MEQ TBCR Take 20 mEq by mouth daily. (Patient not taking: Reported on 10/08/2014) 30 tablet 0  . tenofovir ([name]) 300 MG tablet Take 1 tablet (300 mg total) by mouth daily. 30 tablet 11  . warfarin (COUMADIN) 5 MG tablet Take as directed by anticoagulation clinic provider. 90 tablet 3   No current facility-administered medications for this visit.   Family History  Problem Relation Age of Onset  . Arthritis Mother   . Kidney disease Maternal Grandfather    History   Social History  . Marital Status: Married    Spouse Name: N/A    Number of Children: 3  . Years of Education: N/A   Occupational History  .     Social History Main Topics  . Smoking status: Current Every Day Smoker -- 0.70 packs/day for 36 years    Types: Cigarettes  . Smokeless tobacco: Never Used  . Alcohol Use: No  . Drug Use: No  . Sexual Activity: Not Currently     Comment: declined condoms   Other Topics Concern  . None   Social History Narrative   Review of Systems: ,A 12 point ROS was performed; pertinent positives and negatives were noted in the HPI   Objective:  Physical Exam: Filed Vitals:   10/12/14 1452  BP:  117/70  Pulse: 95  Temp: 98.2 F (36.8 C)  TempSrc: Oral  Weight: 131 lb 3.2 oz (59.512 kg)  SpO2: 100%   Constitutional: Vital signs reviewed. Patient is a thin male in no acute distress and cooperative with exam. Alert and oriented x3.  Head: Normocephalic and atraumatic Eyes: PERRL, EOMI.  Cardiovascular: RRR, no MRG Pulmonary/Chest: Normal respiratory effort, CTAB, no wheezes, rales, or rhonchi Abdominal: Soft. Non-tender, non-distended, bowel sounds are normal, no guarding present.  Musculoskeletal: L AKA. RLE with good ROM Neurological: A&O x3, moves all 4 extremities (including stump) cranial nerve II-XII are grossly intact  Skin: Warm, dry and intact. No swelling or erythema of extremities.  Psychiatric: Normal mood and affect. Speech and behavior is normal.    Assessment & Plan:   Please refer to Problem List based Assessment and Plan

## 2014-10-12 NOTE — Progress Notes (Signed)
Case discussed with Dr. Glenn soon after the resident saw the patient.  We reviewed the resident's history and exam and pertinent patient test results.  I agree with the assessment, diagnosis, and plan of care documented in the resident's note. 

## 2014-10-12 NOTE — Assessment & Plan Note (Signed)
K 2.6 12/10. 99mEq KCl given in the clinic and he was prescribed 68mEq daily. Pt taking his 43mEq KCl daily and states that he has been eating 1 banana a day. Potassium today is 3.5. - Continue 62mEq KCl - F/u in 1 month for repeat BMP

## 2014-10-12 NOTE — Progress Notes (Signed)
Anti-Coagulation Progress Note  [name] is a 53 y.o. male who is currently on an anti-coagulation regimen.    RECENT RESULTS: Recent results are below, the most recent result is correlated with a dose of 22.5 mg. per week: Lab Results  Component Value Date   INR 1.40 10/12/2014   INR 3.3 09/21/2014   INR 3.70 08/31/2014   PROTIME 38.4* 04/01/2012    ANTI-COAG DOSE: Anticoagulation Dose Instructions as of 10/12/2014      [name] Tue Wed Thu Fri Sat   New Dose 5 mg 2.5 mg 5 mg 2.5 mg 5 mg 2.5 mg 2.5 mg      Note: Patient reports possibly missing dose  ANTICOAG SUMMARY: Anticoagulation Episode Summary    Current INR goal 2.0-3.0  Next INR check 11/02/2014  INR from last check 1.40! (10/12/2014)  Weekly max dose   Target end date Indefinite  [facility]  Preferred lab   Send INR reminders to ANTICOAG IMP   Indications  EMBOLISM AND THROMBOSIS ARTERY [I74.9] Long term current use of anticoagulant [Z79.01]        Comments       Anticoagulation Care Providers    Provider Role Specialty Phone number   Michel Bickers, MD  Infectious Diseases [phone]      ANTICOAG TODAY: Anticoagulation Summary as of 10/12/2014    INR goal 2.0-3.0  Selected INR 1.40! (10/12/2014)  Next INR check 11/02/2014  Target end date Indefinite   Indications  EMBOLISM AND THROMBOSIS ARTERY [I74.9] Long term current use of anticoagulant [Z79.01]      Anticoagulation Episode Summary    INR check location [facility]   Preferred lab    Send INR reminders to ANTICOAG IMP   Comments     Anticoagulation Care Providers    Provider Role Specialty Phone number   Michel Bickers, MD  Infectious Diseases [phone]      PATIENT INSTRUCTIONS: Patient Instructions  Patient instructed to take medications as defined in the Anti-coagulation Track section of this encounter.  Patient instructed to take today's dose.  Patient verbalized understanding of these  instructions.       FOLLOW-UP Return in 3 weeks (on 11/02/2014) for Follow-up INR at 2:00 pm.   Theron Arista, PharmD Clinical Pharmacist - Resident Pager: [phone] 12/14/20154:28 PM

## 2014-10-13 NOTE — Progress Notes (Signed)
Patient on anticoagulation for arterial clot.  INR low.  I have reviewed Dr. Gladstone Pih note.

## 2014-10-19 ENCOUNTER — Other Ambulatory Visit: Payer: Self-pay | Admitting: *Deleted

## 2014-10-19 DIAGNOSIS — G546 Phantom limb syndrome with pain: Secondary | ICD-10-CM

## 2014-10-19 DIAGNOSIS — G8929 Other chronic pain: Secondary | ICD-10-CM

## 2014-10-19 MED ORDER — OXYCODONE HCL 5 MG PO TABS: 5.0000 mg | ORAL_TABLET | Freq: Four times a day (QID) | ORAL | Status: DC | PRN

## 2014-10-19 MED ORDER — MORPHINE SULFATE ER 60 MG PO TBCR: 120.0000 mg | EXTENDED_RELEASE_TABLET | Freq: Two times a day (BID) | ORAL | Status: DC

## 2014-11-02 ENCOUNTER — Ambulatory Visit (INDEPENDENT_AMBULATORY_CARE_PROVIDER_SITE_OTHER): Payer: Medicare Other | Admitting: Pharmacist

## 2014-11-02 ENCOUNTER — Encounter: Payer: Self-pay | Admitting: Internal Medicine

## 2014-11-02 ENCOUNTER — Ambulatory Visit (INDEPENDENT_AMBULATORY_CARE_PROVIDER_SITE_OTHER): Payer: Medicare Other | Admitting: Internal Medicine

## 2014-11-02 VITALS — BP 135/50 | HR 86 | Temp 98.1°F | Ht 71.0 in | Wt 130.8 lb

## 2014-11-02 DIAGNOSIS — I749 Embolism and thrombosis of unspecified artery: Secondary | ICD-10-CM

## 2014-11-02 DIAGNOSIS — Z7901 Long term (current) use of anticoagulants: Secondary | ICD-10-CM | POA: Diagnosis not present

## 2014-11-02 DIAGNOSIS — E876 Hypokalemia: Secondary | ICD-10-CM

## 2014-11-02 LAB — BASIC METABOLIC PANEL WITH GFR
BUN: 6 mg/dL (ref 6–23)
CO2: 22 mEq/L (ref 19–32)
Calcium: 8.9 mg/dL (ref 8.4–10.5)
Chloride: 102 mEq/L (ref 96–112)
Creat: 1.17 mg/dL (ref 0.50–1.35)
GFR, EST AFRICAN AMERICAN: 82 mL/min
GFR, Est Non African American: 71 mL/min
GLUCOSE: 71 mg/dL (ref 70–99)
Potassium: 3.2 mEq/L — ABNORMAL LOW (ref 3.5–5.3)
SODIUM: 133 meq/L — AB (ref 135–145)

## 2014-11-02 LAB — POCT INR: INR: 1.8

## 2014-11-02 MED ORDER — POTASSIUM CHLORIDE ER 20 MEQ PO TBCR: 20.0000 meq | EXTENDED_RELEASE_TABLET | Freq: Every day | ORAL | Status: DC

## 2014-11-02 NOTE — Assessment & Plan Note (Addendum)
Potassium 3.5 a month ago.  Continues to supplement diet with bananas and KDur, and no symptoms of hypokalemia.  Cause unclear with no evidence of GI or urinary losses.  Hyperaldosteronism unlikely.  Potentially due to low calorie, protein heavy diet with poor potassium intake?  This is not a chronic problem, so he may not need ongoing potassium supplementation. -Check BMP today. -Refilled KDur 20 mEq daily for three months. -Told patient to continue supplementing potassium for now, and I will contact him if his potassium comes back high or low and his dose of KDur needs to be modified.  --Addendum-- Arman Filter, MD, PhD Internal Medicine Intern Pager: [phone] 11/03/2014,4:52 PM  K 3.2 currently. -Increase KDur to 20 mEq twice a day. -Return in two weeks for lab appointment to recheck K and Mg. -Consider urine K/creatinine ratio at next appointment to further elucidate cause of hypokalemia. -Called and left a message with the patient explaining the dose increase and asking him to come in 2 weeks from now for labs to be drawn.

## 2014-11-02 NOTE — Progress Notes (Signed)
Anti-Coagulation Progress Note  [name] is a 54 y.o. male who is currently on an anti-coagulation regimen.    RECENT RESULTS: Recent results are below, the most recent result is correlated with a dose of 25 mg. per week: Lab Results  Component Value Date   INR 1.80 11/02/2014   INR 1.40 10/12/2014   INR 3.3 09/21/2014   PROTIME 38.4* 04/01/2012    ANTI-COAG DOSE: Anticoagulation Dose Instructions as of 11/02/2014      [name] Tue Wed Thu Fri Sat   New Dose 5 mg 5 mg 5 mg 2.5 mg 5 mg 2.5 mg 5 mg       ANTICOAG SUMMARY: Anticoagulation Episode Summary    Current INR goal 2.0-3.0  Next INR check 11/30/2014  INR from last check 1.80! (11/02/2014)  Weekly max dose   Target end date Indefinite  [facility]  Preferred lab   Send INR reminders to ANTICOAG IMP   Indications  Embolism and thrombosis of artery [I74.9] Long term current use of anticoagulant [Z79.01]        Comments       Anticoagulation Care Providers    Provider Role Specialty Phone number   Michel Bickers, MD  Infectious Diseases [phone]      ANTICOAG TODAY: Anticoagulation Summary as of 11/02/2014    INR goal 2.0-3.0  Selected INR 1.80! (11/02/2014)  Next INR check 11/30/2014  Target end date Indefinite   Indications  Embolism and thrombosis of artery [I74.9] Long term current use of anticoagulant [Z79.01]      Anticoagulation Episode Summary    INR check location [facility]   Preferred lab    Send INR reminders to ANTICOAG IMP   Comments     Anticoagulation Care Providers    Provider Role Specialty Phone number   Michel Bickers, MD  Infectious Diseases [phone]      PATIENT INSTRUCTIONS: Patient Instructions  Patient instructed to take medications as defined in the Anti-coagulation Track section of this encounter.  Patient instructed to take today's dose.  Patient verbalized understanding of these instructions.       FOLLOW-UP Return in 4 weeks (on  11/30/2014) for Follow up INR at 2:15PM.  Jorene Guest, [name] Pharm.D., CACP

## 2014-11-02 NOTE — Progress Notes (Signed)
   Subjective:    Patient ID: [name], male    DOB: 09-24-1961, 54 y.o.   MRN: [id number]  HPI Kanton Kamel is a 54 year old man with history of HIV (CD4 710, VL <20 on 08/31/14), HBV, EtOH abuse, tobacco abuse, h/o DVT on Coumadin, and CKD presenting for follow up of hypokalemia.  His potassium was 2.6 in clinic on 10/08/14.  He was given 40 mEq KCl in clinic and prescribed 20 mEq KCl daily.  On recheck 10/12/14, his potassium was 3.5.  He has been taking his KCl and eating a banana every other day.  He has been avoiding green, leafy vegetables because he didn't want to affect his Coumadin.  His INR was 1.4 at his last visit, and his dose of Coumadin was increased.  He is scheduled to see Dr. Elie Confer this afternoon.  He denies diarrhea or vomiting.  He reports feeling well over all with no specific complaints.  He denies weakness, dark urine, muscle pain, changes in urination, chest pain, or palpitations.   Review of Systems  Constitutional: Negative for fever, chills, activity change and fatigue.  HENT: Negative for congestion, rhinorrhea and sore throat.   Eyes: Negative for pain.  Respiratory: Negative for cough, shortness of breath and wheezing.   Cardiovascular: Negative for chest pain and palpitations.  Gastrointestinal: Negative for nausea, vomiting, abdominal pain, diarrhea, constipation and blood in stool.  Genitourinary: Negative for dysuria and difficulty urinating.  Musculoskeletal: Positive for arthralgias (Shoulder pain, chronic.). Negative for myalgias.  Skin: Negative for rash.  Neurological: Negative for dizziness, weakness and numbness.       Objective:   Physical Exam  Constitutional: He is oriented to person, place, and time. No distress.  Very thin appearing.  HENT:  Head: Normocephalic and atraumatic.  Eyes: Conjunctivae are normal. Pupils are equal, round, and reactive to light. No scleral icterus.  Dysconjugate gaze, left eye deviated to right.  Vision out  of both eyes.  Neck: Normal range of motion. Neck supple.  Cardiovascular: Normal rate, regular rhythm and normal heart sounds.   Pulmonary/Chest: Effort normal and breath sounds normal. No respiratory distress.  Abdominal: Soft. Bowel sounds are normal. He exhibits no distension. There is no tenderness.  Musculoskeletal: Normal range of motion. He exhibits no edema or tenderness.  R AKA.  Neurological: He is alert and oriented to person, place, and time. He exhibits normal muscle tone.  Skin: Skin is warm and dry. No rash noted.          Assessment & Plan:  Please see problem-based assessment and plan.

## 2014-11-02 NOTE — Assessment & Plan Note (Signed)
On life-long Coumadin.  INR was subtherapeutic at last visit with Dr. Elie Confer and dose was adjusted.  He is scheduled to see Dr. Elie Confer again this afternoon. -POC INR. -Coumadin dosing per Dr. Elie Confer.

## 2014-11-02 NOTE — Patient Instructions (Signed)
Patient instructed to take medications as defined in the Anti-coagulation Track section of this encounter.  Patient instructed to take today's dose.  Patient verbalized understanding of these instructions.    

## 2014-11-02 NOTE — Patient Instructions (Signed)
Thank you for coming to clinic today [name].  General instructions: -We will check your potassium today to make sure it is normal. -I sent a prescription for additional potassium pills to your pharmacy, keep taking these unless I call and tell you to stop. -Continue supplementing your diet with bananas to help with your potassium level. -Please make a follow up appointment to return to clinic in 3 months.  Please try to bring all your medicines next time. This helps [place] take good care of you and stops mistakes from medicines that could hurt you.

## 2014-11-03 MED ORDER — POTASSIUM CHLORIDE ER 20 MEQ PO TBCR: 20.0000 meq | EXTENDED_RELEASE_TABLET | Freq: Two times a day (BID) | ORAL | Status: DC

## 2014-11-03 NOTE — Addendum Note (Signed)
Addended by: Charlesetta Shanks on: 11/03/2014 05:07 PM   Modules accepted: Orders

## 2014-11-03 NOTE — Progress Notes (Signed)
Internal Medicine Clinic Attending  Case discussed with Dr. Moding at the time of the visit.  We reviewed the resident's history and exam and pertinent patient test results.  I agree with the assessment, diagnosis, and plan of care documented in the resident's note. 

## 2014-11-17 ENCOUNTER — Other Ambulatory Visit: Payer: Medicare Other

## 2014-11-18 ENCOUNTER — Other Ambulatory Visit: Payer: Self-pay | Admitting: *Deleted

## 2014-11-18 DIAGNOSIS — G8929 Other chronic pain: Secondary | ICD-10-CM

## 2014-11-18 DIAGNOSIS — G546 Phantom limb syndrome with pain: Secondary | ICD-10-CM

## 2014-11-18 MED ORDER — MORPHINE SULFATE ER 60 MG PO TBCR: 120.0000 mg | EXTENDED_RELEASE_TABLET | Freq: Two times a day (BID) | ORAL | Status: DC

## 2014-11-18 MED ORDER — OXYCODONE HCL 5 MG PO TABS: 5.0000 mg | ORAL_TABLET | Freq: Four times a day (QID) | ORAL | Status: DC | PRN

## 2014-11-23 ENCOUNTER — Other Ambulatory Visit: Payer: Medicare Other

## 2014-11-30 ENCOUNTER — Ambulatory Visit (INDEPENDENT_AMBULATORY_CARE_PROVIDER_SITE_OTHER): Payer: Medicare Other | Admitting: Pharmacist

## 2014-11-30 ENCOUNTER — Other Ambulatory Visit (INDEPENDENT_AMBULATORY_CARE_PROVIDER_SITE_OTHER): Payer: Medicare Other

## 2014-11-30 DIAGNOSIS — E876 Hypokalemia: Secondary | ICD-10-CM

## 2014-11-30 DIAGNOSIS — I749 Embolism and thrombosis of unspecified artery: Secondary | ICD-10-CM | POA: Diagnosis not present

## 2014-11-30 DIAGNOSIS — Z7901 Long term (current) use of anticoagulants: Secondary | ICD-10-CM

## 2014-11-30 LAB — BASIC METABOLIC PANEL WITH GFR
BUN: 4 mg/dL — ABNORMAL LOW (ref 6–23)
CALCIUM: 8.7 mg/dL (ref 8.4–10.5)
CO2: 23 mEq/L (ref 19–32)
Chloride: 109 mEq/L (ref 96–112)
Creat: 1.34 mg/dL (ref 0.50–1.35)
GFR, Est African American: 69 mL/min
GFR, Est Non African American: 60 mL/min
Glucose, Bld: 96 mg/dL (ref 70–99)
Potassium: 3.4 mEq/L — ABNORMAL LOW (ref 3.5–5.3)
Sodium: 133 mEq/L — ABNORMAL LOW (ref 135–145)

## 2014-11-30 LAB — MAGNESIUM: MAGNESIUM: 2 mg/dL (ref 1.5–2.5)

## 2014-11-30 LAB — POCT INR: INR: 3.6

## 2014-11-30 NOTE — Progress Notes (Signed)
Anti-Coagulation Progress Note  [name] is a 54 y.o. male who is currently on an anti-coagulation regimen.    RECENT RESULTS: Recent results are below, the most recent result is correlated with a dose of 30 mg. per week: Lab Results  Component Value Date   INR 3.60 11/30/2014   INR 1.80 11/02/2014   INR 1.40 10/12/2014   PROTIME 38.4* 04/01/2012    ANTI-COAG DOSE: Anticoagulation Dose Instructions as of 11/30/2014      [name] Tue Wed Thu Fri Sat   New Dose 5 mg 2.5 mg 5 mg 2.5 mg 5 mg 2.5 mg 5 mg       ANTICOAG SUMMARY: Anticoagulation Episode Summary    Current INR goal 2.0-3.0  Next INR check 12/21/2014  INR from last check 3.60! (11/30/2014)  Weekly max dose   Target end date Indefinite  [facility]  Preferred lab   Send INR reminders to [facility]AG [facility]   Indications  Embolism and thrombosis of artery [I74.9] Long term current use of anticoagulant [Z79.01]        Comments       Anticoagulation Care Providers    Provider Role Specialty Phone number   Michel Bickers, MD  Infectious Diseases [phone]      ANTICOAG TODAY: Anticoagulation Summary as of 11/30/2014    INR goal 2.0-3.0  Selected INR 3.60! (11/30/2014)  Next INR check 12/21/2014  Target end date Indefinite   Indications  Embolism and thrombosis of artery [I74.9] Long term current use of anticoagulant [Z79.01]      Anticoagulation Episode Summary    [facility]   Preferred lab    Send INR reminders to [facility]TICOAG [facility]   Comments     Anticoagulation Care Providers    Provider Role Specialty Phone number   Michel Bickers, MD  Infectious Diseases [phone]      PATIENT INSTRUCTIONS: Patient Instructions  Patient instructed to take medications as defined in the Anti-coagulation Track section of this encounter.  Patient instructed to take today's dose.  Patient verbalized understanding of these instructions.       FOLLOW-UP Return in 3 weeks (on  12/21/2014) for Follow up INR at [place], [name] Pharm.D., CACP

## 2014-11-30 NOTE — Patient Instructions (Signed)
Patient instructed to take medications as defined in the Anti-coagulation Track section of this encounter.  Patient instructed to take today's dose.  Patient verbalized understanding of these instructions.    

## 2014-12-01 NOTE — Progress Notes (Signed)
INTERNAL MEDICINE TEACHING ATTENDING ADDENDUM - Aldine Contes M.D  Duration- indefinite, Indication- arterial thrombosis, INR- supratherapeutic. Agree with Dr. Gladstone Pih recommendations as outlined in his note.

## 2014-12-17 ENCOUNTER — Other Ambulatory Visit: Payer: Self-pay | Admitting: Licensed Clinical Social Worker

## 2014-12-17 DIAGNOSIS — G8929 Other chronic pain: Secondary | ICD-10-CM

## 2014-12-17 DIAGNOSIS — G546 Phantom limb syndrome with pain: Secondary | ICD-10-CM

## 2014-12-17 MED ORDER — MORPHINE SULFATE ER 60 MG PO TBCR: 120.0000 mg | EXTENDED_RELEASE_TABLET | Freq: Two times a day (BID) | ORAL | Status: DC

## 2014-12-17 MED ORDER — OXYCODONE HCL 5 MG PO TABS: 5.0000 mg | ORAL_TABLET | Freq: Four times a day (QID) | ORAL | Status: DC | PRN

## 2014-12-21 ENCOUNTER — Ambulatory Visit (INDEPENDENT_AMBULATORY_CARE_PROVIDER_SITE_OTHER): Payer: Medicare Other | Admitting: Pharmacist

## 2014-12-21 DIAGNOSIS — Z7901 Long term (current) use of anticoagulants: Secondary | ICD-10-CM | POA: Diagnosis not present

## 2014-12-21 DIAGNOSIS — I749 Embolism and thrombosis of unspecified artery: Secondary | ICD-10-CM | POA: Diagnosis not present

## 2014-12-21 LAB — POCT INR: INR: 2.5

## 2014-12-21 NOTE — Progress Notes (Signed)
Anti-Coagulation Progress Note  [name] is a 54 y.o. male who is currently on an anti-coagulation regimen.    RECENT RESULTS: Recent results are below, the most recent result is correlated with a dose of 27.5 mg. per week: Lab Results  Component Value Date   INR 2.50 12/21/2014   INR 3.60 11/30/2014   INR 1.80 11/02/2014   PROTIME 38.4* 04/01/2012    ANTI-COAG DOSE: Anticoagulation Dose Instructions as of 12/21/2014      Sun Mon Tue Wed Thu Fri Sat   New Dose 5 mg 2.5 mg 5 mg 2.5 mg 5 mg 2.5 mg 5 mg       ANTICOAG SUMMARY: Anticoagulation Episode Summary    Current INR goal 2.0-3.0  Next INR check 01/25/2015  INR from last check 2.50 (12/21/2014)  Weekly max dose   Target end date Indefinite  [facility]  Preferred lab   Send INR reminders to ANTICOAG [facility]   Indications  Embolism and thrombosis of artery [I74.9] Long term current use of anticoagulant [Z79.01]        Comments       Anticoagulation Care Providers    Provider Role Specialty Phone number   Michel Bickers, MD  Infectious Diseases [phone]      ANTICOAG TODAY: Anticoagulation Summary as of 12/21/2014    INR goal 2.0-3.0  Selected INR 2.50 (12/21/2014)  Next INR check 01/25/2015  Target end date Indefinite   Indications  Embolism and thrombosis of artery [I74.9] Long term current use of anticoagulant [Z79.01]      Anticoagulation Episode Summary    [facility]   Preferred lab    Send INR reminders to ANTICOAG [facility]   Comments     Anticoagulation Care Providers    Provider Role Specialty Phone number   Michel Bickers, MD  Infectious Diseases [phone]      PATIENT INSTRUCTIONS: Patient Instructions  Patient instructed to take medications as defined in the Anti-coagulation Track section of this encounter.  Patient instructed to take today's dose.  Patient verbalized understanding of these instructions.       FOLLOW-UP Return in 5 weeks  (on 01/25/2015) for Follow up INR at [place], [name] Pharm.D., CACP

## 2014-12-21 NOTE — Patient Instructions (Signed)
Patient instructed to take medications as defined in the Anti-coagulation Track section of this encounter.  Patient instructed to take today's dose.  Patient verbalized understanding of these instructions.    

## 2014-12-23 NOTE — Progress Notes (Signed)
INTERNAL MEDICINE TEACHING ATTENDING ADDENDUM - Aldine Contes M.D  Duration- indefinite, Indication- b/l DVT, INR- therapeutic. Agree with Dr. Gladstone Pih recommendations as outlined in his note.

## 2015-01-18 ENCOUNTER — Telehealth: Payer: Self-pay | Admitting: *Deleted

## 2015-01-18 DIAGNOSIS — G8929 Other chronic pain: Secondary | ICD-10-CM

## 2015-01-18 DIAGNOSIS — G546 Phantom limb syndrome with pain: Secondary | ICD-10-CM

## 2015-01-18 DIAGNOSIS — R634 Abnormal weight loss: Secondary | ICD-10-CM

## 2015-01-18 MED ORDER — MORPHINE SULFATE ER 60 MG PO TBCR: 120.0000 mg | EXTENDED_RELEASE_TABLET | Freq: Two times a day (BID) | ORAL | Status: DC

## 2015-01-18 MED ORDER — OXYCODONE HCL 5 MG PO TABS: 5.0000 mg | ORAL_TABLET | Freq: Four times a day (QID) | ORAL | Status: DC | PRN

## 2015-01-18 MED ORDER — ENSURE PO LIQD: 1.0000 | Freq: Three times a day (TID) | ORAL | Status: DC

## 2015-01-18 NOTE — Telephone Encounter (Signed)
Pt needing referral to [facility] for fitting.  MD please advise.

## 2015-01-25 ENCOUNTER — Other Ambulatory Visit: Payer: Self-pay | Admitting: Internal Medicine

## 2015-01-25 ENCOUNTER — Ambulatory Visit (INDEPENDENT_AMBULATORY_CARE_PROVIDER_SITE_OTHER): Payer: Medicare Other | Admitting: Pharmacist

## 2015-01-25 DIAGNOSIS — Z7901 Long term (current) use of anticoagulants: Secondary | ICD-10-CM

## 2015-01-25 DIAGNOSIS — I749 Embolism and thrombosis of unspecified artery: Secondary | ICD-10-CM

## 2015-01-25 LAB — POCT INR: INR: 3.4

## 2015-01-25 NOTE — Progress Notes (Signed)
CLINICAL PHARMACY ANTICOAGULATION MANAGEMENT [name] is a 54 y.o. male who reports to the clinic for monitoring of warfarin treatment.    Indication: embolism and thrombosis of artery Duration: indefinite  Anticoagulation Clinic Visit History: Anticoagulation Episode Summary    Current INR goal 2.0-3.0  Next INR check 02/08/2015  INR from last check 3.4! (01/25/2015)  Weekly max dose   Target end date Indefinite  [facility]  Preferred lab   Send INR reminders to ANT[facility]AG [facility]   Indications  Embolism and thrombosis of artery [I74.9] Long term current use of anticoagulant [Z79.01]        Comments       Anticoagulation Care Providers    Provider Role Specialty Phone number   Michel Bickers, MD  Infectious Diseases [phone]     ASSESSMENT Recent Results: Recent results are below, the most recent result is correlated with a dose of 27.5 mg per week: Lab Results  Component Value Date   INR 3.4 01/25/2015   INR 2.50 12/21/2014   INR 3.60 11/30/2014   PROTIME 38.4* 04/01/2012    INR today: Supratherapeutic  Anticoagulation Dosing: INR as of 01/25/2015 and Previous Dosing Information    INR Dt INR Goal [employer] Sun Mon Tue Wed Thu Fri Sat   01/25/2015 3.4 2.0-3.0 27.5 mg 5 mg 2.5 mg 5 mg 2.5 mg 5 mg 2.5 mg 5 mg    Anticoagulation Dose Instructions as of 01/25/2015      Total Sun Mon Tue Wed Thu Fri Sat   New Dose 25 mg 5 mg 2.5 mg 2.5 mg 2.5 mg 5 mg 2.5 mg 5 mg     (5 mg x 1)  (5 mg x 0.5)  (5 mg x 0.5)  (5 mg x 0.5)  (5 mg x 1)  (5 mg x 0.5)  (5 mg x 1)                           PLAN Weekly dose was decreased by 9% to 25 mg per week  Patient Instructions  Patient educated about medication as defined in this encounter and verbalized understanding by repeating back instructions provided.     Follow-up Return in about 2 weeks (around 02/08/2015) for Follow up INR on 02/08/15 at 2 pm.  Flossie Dibble, PharmD BCPS, BCACP

## 2015-01-25 NOTE — Patient Instructions (Signed)
Patient educated about medication as defined in this encounter and verbalized understanding by repeating back instructions provided.   

## 2015-02-08 ENCOUNTER — Ambulatory Visit (INDEPENDENT_AMBULATORY_CARE_PROVIDER_SITE_OTHER): Payer: Medicare Other | Admitting: Pharmacist

## 2015-02-08 DIAGNOSIS — Z7901 Long term (current) use of anticoagulants: Secondary | ICD-10-CM

## 2015-02-08 DIAGNOSIS — I749 Embolism and thrombosis of unspecified artery: Secondary | ICD-10-CM

## 2015-02-08 LAB — POCT INR: INR: 2.6

## 2015-02-08 NOTE — Patient Instructions (Signed)
Patient instructed to take medications as defined in the Anti-coagulation Track section of this encounter.  Patient instructed to take today's dose.  Patient verbalized understanding of these instructions.    

## 2015-02-08 NOTE — Progress Notes (Signed)
Anti-Coagulation Progress Note  [name] is a 54 y.o. male who is currently on an anti-coagulation regimen.    RECENT RESULTS: Recent results are below, the most recent result is correlated with a dose of 25 mg. per week: Lab Results  Component Value Date   INR 2.60 02/08/2015   INR 3.4 01/25/2015   INR 2.50 12/21/2014   PROTIME 38.4* 04/01/2012    ANTI-COAG DOSE: Anticoagulation Dose Instructions as of 02/08/2015      [name] Tue Wed Thu Fri Sat   New Dose 2.5 mg 5 mg 2.5 mg 5 mg 2.5 mg 5 mg 2.5 mg       ANTICOAG SUMMARY: Anticoagulation Episode Summary    Current INR goal 2.0-3.0  Next INR check 03/08/2015  INR from last check 2.60 (02/08/2015)  Weekly max dose   Target end date Indefinite  [facility]  Preferred lab   Send INR reminders to [facility]   Indications  Embolism and thrombosis of artery [I74.9] Long term current use of anticoagulant [Z79.01]        Comments       Anticoagulation Care Providers    Provider Role Specialty Phone number   Michel Bickers, MD  Infectious Diseases [phone]      ANTICOAG TODAY: Anticoagulation Summary as of 02/08/2015    INR goal 2.0-3.0  Selected INR 2.60 (02/08/2015)  Next INR check 03/08/2015  Target end date Indefinite   Indications  Embolism and thrombosis of artery [I74.9] Long term current use of anticoagulant [Z79.01]      Anticoagulation Episode Summary    [facility]   Preferred lab    Send INR reminders to ANT[facility]AG [facility]   Comments     Anticoagulation Care Providers    Provider Role Specialty Phone number   Michel Bickers, MD  Infectious Diseases [phone]      PATIENT INSTRUCTIONS: Patient Instructions  Patient instructed to take medications as defined in the Anti-coagulation Track section of this encounter.  Patient instructed to take today's dose.  Patient verbalized understanding of these instructions.       FOLLOW-UP Return in 4 weeks (on  03/08/2015) for Follow up INR at 1:15PM.  Jorene Guest, [name] Pharm.D., CACP

## 2015-02-16 ENCOUNTER — Other Ambulatory Visit: Payer: Self-pay | Admitting: *Deleted

## 2015-02-16 DIAGNOSIS — G8929 Other chronic pain: Secondary | ICD-10-CM

## 2015-02-16 DIAGNOSIS — G546 Phantom limb syndrome with pain: Secondary | ICD-10-CM

## 2015-02-16 MED ORDER — OXYCODONE HCL 5 MG PO TABS: 5.0000 mg | ORAL_TABLET | Freq: Four times a day (QID) | ORAL | Status: DC | PRN

## 2015-02-16 MED ORDER — MORPHINE SULFATE ER 60 MG PO TBCR: 120.0000 mg | EXTENDED_RELEASE_TABLET | Freq: Two times a day (BID) | ORAL | Status: DC

## 2015-03-08 ENCOUNTER — Ambulatory Visit (INDEPENDENT_AMBULATORY_CARE_PROVIDER_SITE_OTHER): Payer: Medicare Other | Admitting: Pharmacist

## 2015-03-08 ENCOUNTER — Ambulatory Visit: Payer: Medicare Other

## 2015-03-08 DIAGNOSIS — Z7901 Long term (current) use of anticoagulants: Secondary | ICD-10-CM

## 2015-03-08 DIAGNOSIS — I749 Embolism and thrombosis of unspecified artery: Secondary | ICD-10-CM

## 2015-03-08 LAB — POCT INR: INR: 2.1

## 2015-03-08 NOTE — Progress Notes (Signed)
INTERNAL MEDICINE TEACHING ATTENDING ADDENDUM - Aldine Contes M.D  Duration- lifelong, Indication- arterial thromboembolism, INR- therapeutic. Agree with pharmacy recommendations as outlined in their note.

## 2015-03-08 NOTE — Patient Instructions (Signed)
Patient instructed to take medications as defined in the Anti-coagulation Track section of this encounter.  Patient instructed to take today's dose.  Patient verbalized understanding of these instructions.    

## 2015-03-08 NOTE — Progress Notes (Signed)
Anti-Coagulation Progress Note  [name] is a 54 y.o. male who is currently on an anti-coagulation regimen.    RECENT RESULTS: Recent results are below, the most recent result is correlated with a dose of 25 mg. per week: Lab Results  Component Value Date   INR 2.10 03/08/2015   INR 2.60 02/08/2015   INR 3.4 01/25/2015   PROTIME 38.4* 04/01/2012    ANTI-COAG DOSE: Anticoagulation Dose Instructions as of 03/08/2015      Sun Mon Tue Wed Thu Fri Sat   New Dose 2.5 mg 5 mg 2.5 mg 5 mg 2.5 mg 5 mg 2.5 mg       ANTICOAG SUMMARY: Anticoagulation Episode Summary    Current INR goal 2.0-3.0  Next INR check 04/05/2015  INR from last check 2.10 (03/08/2015)  Weekly max dose   Target end date Indefinite  [facility]  Preferred lab   Send INR reminders to [facility]   Indications  Embolism and thrombosis of artery [I74.9] Long term current use of anticoagulant [Z79.01]        Comments       Anticoagulation Care Providers    Provider Role Specialty Phone number   Michel Bickers, MD  Infectious Diseases [phone]      ANTICOAG TODAY: Anticoagulation Summary as of 03/08/2015    INR goal 2.0-3.0  Selected INR 2.10 (03/08/2015)  Next INR check 04/05/2015  Target end date Indefinite   Indications  Embolism and thrombosis of artery [I74.9] Long term current use of anticoagulant [Z79.01]      Anticoagulation Episode Summary    [facility] check [facility]   Preferred lab    Send INR reminders to ANT[facility]   Comments     Anticoagulation Care Providers    Provider Role Specialty Phone number   Michel Bickers, MD  Infectious Diseases [phone]      PATIENT INSTRUCTIONS: Patient Instructions  Patient instructed to take medications as defined in the Anti-coagulation Track section of this encounter.  Patient instructed to take today's dose.  Patient verbalized understanding of these instructions.       FOLLOW-UP Return in 4 weeks (on  04/05/2015) for Follow up INR at [place], [name] Pharm.D., CACP

## 2015-03-12 ENCOUNTER — Other Ambulatory Visit: Payer: Medicare Other

## 2015-03-12 DIAGNOSIS — B20 Human immunodeficiency virus [HIV] disease: Secondary | ICD-10-CM | POA: Diagnosis not present

## 2015-03-12 LAB — T-HELPER CELL (CD4) - (RCID CLINIC ONLY)
CD4 T CELL HELPER: 26 % — AB (ref 33–55)
CD4 T Cell Abs: 1210 /uL (ref 400–2700)

## 2015-03-15 LAB — HIV-1 RNA QUANT-NO REFLEX-BLD
HIV 1 RNA Quant: <20 copies/mL (ref <20)
HIV-1 RNA Quant, Log: <1.3 {Log} (ref <1.30)

## 2015-03-16 ENCOUNTER — Ambulatory Visit: Payer: Medicare Other | Admitting: Internal Medicine

## 2015-03-17 ENCOUNTER — Encounter: Payer: Self-pay | Admitting: *Deleted

## 2015-03-17 ENCOUNTER — Other Ambulatory Visit: Payer: Self-pay | Admitting: *Deleted

## 2015-03-17 DIAGNOSIS — G546 Phantom limb syndrome with pain: Secondary | ICD-10-CM

## 2015-03-17 DIAGNOSIS — G8929 Other chronic pain: Secondary | ICD-10-CM

## 2015-03-17 MED ORDER — OXYCODONE HCL 5 MG PO TABS: 5.0000 mg | ORAL_TABLET | Freq: Four times a day (QID) | ORAL | Status: DC | PRN

## 2015-03-17 MED ORDER — MORPHINE SULFATE ER 60 MG PO TBCR: 120.0000 mg | EXTENDED_RELEASE_TABLET | Freq: Two times a day (BID) | ORAL | Status: DC

## 2015-03-18 ENCOUNTER — Encounter: Payer: Self-pay | Admitting: Internal Medicine

## 2015-03-18 ENCOUNTER — Ambulatory Visit (INDEPENDENT_AMBULATORY_CARE_PROVIDER_SITE_OTHER): Payer: Medicare Other | Admitting: Internal Medicine

## 2015-03-18 VITALS — BP 137/64 | HR 83 | Temp 98.1°F | Wt 126.2 lb

## 2015-03-18 DIAGNOSIS — F17210 Nicotine dependence, cigarettes, uncomplicated: Secondary | ICD-10-CM

## 2015-03-18 DIAGNOSIS — I749 Embolism and thrombosis of unspecified artery: Secondary | ICD-10-CM | POA: Diagnosis not present

## 2015-03-18 DIAGNOSIS — N182 Chronic kidney disease, stage 2 (mild): Secondary | ICD-10-CM | POA: Diagnosis not present

## 2015-03-18 DIAGNOSIS — Z7982 Long term (current) use of aspirin: Secondary | ICD-10-CM | POA: Diagnosis not present

## 2015-03-18 DIAGNOSIS — Z21 Asymptomatic human immunodeficiency virus [HIV] infection status: Secondary | ICD-10-CM | POA: Diagnosis not present

## 2015-03-18 DIAGNOSIS — Z89611 Acquired absence of right leg above knee: Secondary | ICD-10-CM

## 2015-03-18 DIAGNOSIS — N189 Chronic kidney disease, unspecified: Secondary | ICD-10-CM

## 2015-03-18 DIAGNOSIS — E876 Hypokalemia: Secondary | ICD-10-CM | POA: Diagnosis not present

## 2015-03-18 DIAGNOSIS — Z0000 Encounter for general adult medical examination without abnormal findings: Secondary | ICD-10-CM

## 2015-03-18 DIAGNOSIS — B20 Human immunodeficiency virus [HIV] disease: Secondary | ICD-10-CM

## 2015-03-18 DIAGNOSIS — Z7901 Long term (current) use of anticoagulants: Secondary | ICD-10-CM | POA: Diagnosis not present

## 2015-03-18 DIAGNOSIS — R634 Abnormal weight loss: Secondary | ICD-10-CM

## 2015-03-18 DIAGNOSIS — Z1211 Encounter for screening for malignant neoplasm of colon: Secondary | ICD-10-CM

## 2015-03-18 NOTE — Assessment & Plan Note (Signed)
Pt declines colonoscopy, but is willing to use the stool cards - Stool cards and instructions provided this clinic visit.

## 2015-03-18 NOTE — Assessment & Plan Note (Addendum)
He was last seen in the clinic in Jan, and his K was 3.2, possibly from dietary deficiencies vs. renal losses. He was asked to increase his KCl to 79mEq BID and returned in Feb for repeat labs, which showed a K of 3.4. Per pt, he is still taking 20Meq daily and did not know he was to increase his KCl to BID.  - Checking BMP today - If remaining low on supplementation, will need to check K/Cr ratio to determine i his hypokalemia is from renal losses.

## 2015-03-18 NOTE — Assessment & Plan Note (Signed)
HIV well controlled. CD4 1210, VL <20 on 03/12/15. Pt follows with Dr. Megan Salon. - Appt with Dr. Megan Salon 04/06/15

## 2015-03-18 NOTE — Progress Notes (Signed)
Patient ID: [name], male   DOB: 1961/01/12, 54 y.o.   MRN: [id number]  Subjective:   Patient ID: [name] male   DOB: 03/19/1961 54 y.o.   MRN: [id number]  HPI: [name] is a 54 y.o. M w/ PMH HIV (CD4 1210, VL <20 on 03/12/15), Hep B, EtOH abuse, tobacco abuse, h/o DVT on Coumadin, and CKD who presents for a f/u for his hypokalemia.  Please refer to Problem List.     Past Medical History  Diagnosis Date  . History of chicken pox   . Hyperlipidemia   . History of DVT of lower extremity     right  . Left hip pain 03/09/2013  . Preventative health care 03/09/2013   Current Outpatient Prescriptions  Medication Sig Dispense Refill  . aspirin 81 MG tablet Take 81 mg by mouth daily.      Marland Kitchen atazanavir-cobicistat (EVOTAZ) 300-150 MG per tablet Take 1 tablet by mouth daily. Swallow whole. Do NOT crush, cut or chew tablet. Take with food. 30 tablet 11  . dolutegravir ([name]) 50 MG tablet Take 1 tablet (50 mg total) by mouth daily. 30 tablet 11  . ENSURE (ENSURE) Take 1 [name] by mouth 3 (three) times daily between meals. 237 mL prn  . fenofibrate (TRICOR) 145 MG tablet TAKE 1 TABLET BY MOUTH EVERY DAY 30 tablet 5  . gabapentin (NEURONTIN) 400 MG capsule TAKE ONE CAPSULE BY MOUTH FOUR TIMES DAILY 120 capsule 11  . morphine (MS CONTIN) 60 MG 12 hr tablet Take 2 tablets (120 mg total) by mouth every 12 (twelve) hours. 120 tablet 0  . oxyCODONE (OXY IR/ROXICODONE) 5 MG immediate release tablet Take 1 tablet (5 mg total) by mouth every 6 (six) hours as needed for severe pain. 60 tablet 0  . Potassium Chloride ER 20 MEQ TBCR Take 20 mEq by mouth 2 (two) times daily. 60 tablet 0  . tenofovir ([name]) 300 MG tablet Take 1 tablet (300 mg total) by mouth daily. 30 tablet 11  . warfarin (COUMADIN) 5 MG tablet Take as directed by anticoagulation clinic provider. 90 tablet 3   No current facility-administered medications for this visit.   Family History  Problem Relation Age of Onset  .  Arthritis Mother   . Kidney disease Maternal Grandfather    History   Social History  . Marital Status: Married    Spouse Name: N/A  . Number of Children: 3  . Years of Education: N/A   Occupational History  .     Social History Main Topics  . Smoking status: Current Every Day Smoker -- 0.70 packs/day for 36 years    Types: Cigarettes  . Smokeless tobacco: Never Used  . Alcohol Use: No  . Drug Use: No  . Sexual Activity: Not Currently     Comment: declined condoms   Other Topics Concern  . None   Social History Narrative   Review of Systems: Constitutional: Denies fever, chills, or changes in appetite.  Respiratory: Denies SOB, DOE Cardiovascular: Denies chest pain Gastrointestinal: Denies nausea, vomiting, abdominal pain Musculoskeletal: Denies myalgias, back pain Skin: Denies rashes Neurological: Denies dizziness, syncope, or weakness.  Psychiatric/Behavioral: Denies mood changes or confusion  Objective:  Physical Exam: Filed Vitals:   03/18/15 1414  BP: 137/64  Pulse: 83  Temp: 98.1 F (36.7 C)  TempSrc: Oral  Weight: 126 lb 3.2 oz (57.244 kg)  SpO2: 100%   Constitutional: Vital signs reviewed.  Patient is a cachectic male in no acute  distress and cooperative with exam. Alert and oriented x3.  Head: Normocephalic and atraumatic Eyes: PERRL, EOMI Neck: Supple, Trachea midline, normal ROM, No JVD  Cardiovascular: RRR, no MRG Pulmonary/Chest: Normal respiratory effort, CTAB, no wheezes, rales, or rhonchi Abdominal: Soft. Non-tender, non-distended, bowel sounds are normal Musculoskeletal: R AKA Neurological: A&O x3, Strength is normal and symmetric bilaterally, cranial nerve II-XII are grossly intact, no focal deficits.  Skin: Warm, dry and intact.  Psychiatric: Normal mood and affect. Speech and behavior is normal.    Assessment & Plan:   Please refer to Problem List based Assessment and Plan

## 2015-03-18 NOTE — Assessment & Plan Note (Signed)
Renal function appears stable from BMP in Feb.  - Checking BMP today.

## 2015-03-18 NOTE — Assessment & Plan Note (Signed)
On life-long anticoagulation. Last INR therapeutic at 2.1 on 03/08/15. Followed in [facility] clinic.  - Continue Coumadin dosing per [facility]

## 2015-03-18 NOTE — Assessment & Plan Note (Signed)
Pt requesting additional supplies for his RLE prosthesis, including a new fitted sock. [employer] called, and they need an rx for prosthesis equipment, and the pt will need to schedule an appt to be seen by them- pt notified. - Rx given for new prosthesis equipment.

## 2015-03-18 NOTE — Assessment & Plan Note (Signed)
Mr. Isenhower is down another 4lbs since Jan '16. He states that he has not eaten anything today b/c he has been busy. He does endorse some increased stressors involving multiple family members passing away, which he feels has decreased his appetite. He is drinking at least 4 Ensures a day. He states that he has enough money to purchase his food and is heading to the store after his appt today. I offered to refer him to our clinic nutritionist, but he declined today. - Continue to encourage increased po intake - F/u in 1 mo for weight check.

## 2015-03-18 NOTE — Patient Instructions (Signed)
Continue to take the 29mEq (1 tablet) of teh potassium supplement. We will check your potassium today, and I will call you if you need to increase the number of pills.  Return to the clinic in 1 month for a recheck of your weight and your potassium.    Please use the stool cards as instructed and return them to the clinic.   General Instructions:   Please bring your medicines with you each time you come to clinic.  Medicines may include prescription medications, over-the-counter medications, herbal remedies, eye drops, vitamins, or other pills.   Progress Toward Treatment Goals:  Treatment Goal 10/08/2014  Stop smoking smoking the same amount    Self Care Goals & Plans:  Self Care Goal 11/02/2014  Manage my medications take my medicines as prescribed; bring my medications to every visit; refill my medications on time  Monitor my health -  Eat healthy foods -  Be physically active -  Stop smoking (No Data)    No flowsheet data found.   Care Management & Community Referrals:  No flowsheet data found.

## 2015-03-19 LAB — BASIC METABOLIC PANEL WITH GFR
BUN: 5 mg/dL — ABNORMAL LOW (ref 6–23)
CO2: 23 meq/L (ref 19–32)
CREATININE: 1.29 mg/dL (ref 0.50–1.35)
Calcium: 9.1 mg/dL (ref 8.4–10.5)
Chloride: 104 mEq/L (ref 96–112)
GFR, EST AFRICAN AMERICAN: 72 mL/min
GFR, Est Non African American: 62 mL/min
GLUCOSE: 80 mg/dL (ref 70–99)
Potassium: 3.5 mEq/L (ref 3.5–5.3)
Sodium: 133 mEq/L — ABNORMAL LOW (ref 135–145)

## 2015-03-19 NOTE — Progress Notes (Signed)
Internal Medicine Clinic Attending  Case discussed with Dr. Glenn soon after the resident saw the patient.  We reviewed the resident's history and exam and pertinent patient test results.  I agree with the assessment, diagnosis, and plan of care documented in the resident's note. 

## 2015-03-28 ENCOUNTER — Other Ambulatory Visit: Payer: Self-pay | Admitting: Internal Medicine

## 2015-03-31 ENCOUNTER — Other Ambulatory Visit (INDEPENDENT_AMBULATORY_CARE_PROVIDER_SITE_OTHER): Payer: Medicare Other

## 2015-03-31 DIAGNOSIS — Z1211 Encounter for screening for malignant neoplasm of colon: Secondary | ICD-10-CM

## 2015-03-31 LAB — POC HEMOCCULT BLD/STL (HOME/3-CARD/SCREEN)
Card #2 Fecal Occult Blod, POC: NEGATIVE
FECAL OCCULT BLD: NEGATIVE
Fecal Occult Blood, POC: NEGATIVE

## 2015-03-31 NOTE — Addendum Note (Signed)
Addended by: Orson Gear on: 03/31/2015 03:29 PM   Modules accepted: Orders

## 2015-04-05 ENCOUNTER — Ambulatory Visit (INDEPENDENT_AMBULATORY_CARE_PROVIDER_SITE_OTHER): Payer: Medicare Other | Admitting: Pharmacist

## 2015-04-05 DIAGNOSIS — Z7901 Long term (current) use of anticoagulants: Secondary | ICD-10-CM

## 2015-04-05 DIAGNOSIS — I749 Embolism and thrombosis of unspecified artery: Secondary | ICD-10-CM

## 2015-04-05 LAB — POCT INR: INR: 4

## 2015-04-05 NOTE — Progress Notes (Signed)
  Anti-Coagulation Progress Note [name] is a 54 y.o. male who is currently on an anti-coagulation regimen.  RECENT RESULTS: Recent results are below, the most recent result is correlated with a dose of 25 mg. per week: Lab Results  Component Value Date   INR 4.00 04/05/2015   INR 2.10 03/08/2015   INR 2.60 02/08/2015   PROTIME 38.4* 04/01/2012   ANTI-COAG DOSE: Anticoagulation Dose Instructions as of 04/05/2015      Sun Mon Tue Wed Thu Fri Sat   New Dose 2.5 mg 2.5 mg 5 mg 2.5 mg 2.5 mg 5 mg 2.5 mg      ANTICOAG SUMMARY: Anticoagulation Episode Summary    Current INR goal 2.0-3.0  Next INR check 04/19/2015  INR from last check 4.00! (04/05/2015)  Weekly max dose   Target end date Indefinite  [facility]  Preferred lab   Send INR reminders to ANTICOAG [facility]   Indications  Embolism and thrombosis of artery [I74.9] Long term current use of anticoagulant [Z79.01]        Comments       Anticoagulation Care Providers    Provider Role Specialty Phone number   Michel Bickers, MD  Infectious Diseases [phone]     ANTICOAG TODAY: Anticoagulation Summary as of 04/05/2015    INR goal 2.0-3.0  Selected INR 4.00! (04/05/2015)  Next INR check 04/19/2015  Target end date Indefinite   Indications  Embolism and thrombosis of artery [I74.9] Long term current use of anticoagulant [Z79.01]      Anticoagulation Episode Summary    [facility]   Preferred lab    Send INR reminders to ANTICOAG [facility]   Comments     Anticoagulation Care Providers    Provider Role Specialty Phone number   Michel Bickers, MD  Infectious Diseases [phone]     PATIENT INSTRUCTIONS: Patient Instructions  Patient instructed to take medications as defined in the Anti-coagulation Track section of this encounter. Patient instructed to take today's dose. Patient verbalized understanding of these instructions.    FOLLOW-UP Return in about 2 weeks (around  04/19/2015) for Follow-up INR at 2:00pm.  Horatio Pel Clinical Pharmacy Resident 04/05/2015 2:41 PM

## 2015-04-05 NOTE — Patient Instructions (Signed)
Patient instructed to take medications as defined in the Anti-coagulation Track section of this encounter.  Patient instructed to take today's dose.  Patient verbalized understanding of these instructions.    

## 2015-04-06 ENCOUNTER — Encounter: Payer: Self-pay | Admitting: Internal Medicine

## 2015-04-06 ENCOUNTER — Ambulatory Visit (INDEPENDENT_AMBULATORY_CARE_PROVIDER_SITE_OTHER): Payer: Medicare Other | Admitting: Internal Medicine

## 2015-04-06 VITALS — BP 130/65 | HR 85 | Temp 98.2°F | Wt 125.2 lb

## 2015-04-06 DIAGNOSIS — R634 Abnormal weight loss: Secondary | ICD-10-CM | POA: Diagnosis not present

## 2015-04-06 DIAGNOSIS — I749 Embolism and thrombosis of unspecified artery: Secondary | ICD-10-CM

## 2015-04-06 MED ORDER — ENSURE PO LIQD: 1.0000 | Freq: Three times a day (TID) | ORAL | Status: DC

## 2015-04-06 MED ORDER — MEGESTROL ACETATE 40 MG/ML PO SUSP: 800.0000 mg | Freq: Every day | ORAL | Status: DC

## 2015-04-06 NOTE — Progress Notes (Signed)
Patient ID: [name], male   DOB: 1961-02-13, 54 y.o.   MRN: [id number]          Patient Active Problem List   Diagnosis Date Noted  . Phantom limb syndrome with pain 03/21/2012    Priority: High  . Long term current use of anticoagulant 11/20/2010    Priority: High  . Embolism and thrombosis of artery 07/19/2009    Priority: High  . Human immunodeficiency virus (HIV) disease 11/10/2006    Priority: High  . DYSLIPIDEMIA 11/10/2006    Priority: High  . CIGARETTE SMOKER 11/10/2006    Priority: High  . CKD (chronic kidney disease) 02/10/2014    Priority: Medium  . Hyperglycemia 12/29/2013    Priority: Medium  . Unintentional weight loss 11/04/2013    Priority: Medium  . Hypokalemia 10/08/2014  . Hyperbilirubinemia 12/29/2013  . Left hip pain 03/09/2013  . Preventative health care 03/09/2013  . ERECTILE DYSFUNCTION, ORGANIC 04/12/2010  . GERD 08/23/2009  . METHICILLIN RESISTANT STAPHYLOCOCCUS AUREUS INFECTION 08/05/2009  . ENLARGEMENT OF LYMPH NODES 07/20/2009  . HYPOGONADISM 07/15/2009  . DEFICIENCY OF OTHER VITAMINS 07/15/2009  . PULMONARY NODULE 07/15/2009  . Status post above knee amputation 07/15/2009  . SHINGLES, RECURRENT 11/10/2006  . HEPATITIS B 11/10/2006  . PSYCHOSIS 11/10/2006  . ALCOHOL ABUSE 11/10/2006  . PERIPHERAL NEUROPATHY 11/10/2006  . ALLERGIC RHINITIS 11/10/2006  . DENTAL CARIES 11/10/2006    Patient's Medications  New Prescriptions   MEGESTROL (MEGACE ORAL) 40 MG/ML SUSPENSION    Take 20 mLs (800 mg total) by mouth daily.  Previous Medications   ASPIRIN 81 MG TABLET    Take 81 mg by mouth daily.     ATAZANAVIR-COBICISTAT (EVOTAZ) 300-150 MG PER TABLET    Take 1 tablet by mouth daily. Swallow whole. Do NOT crush, cut or chew tablet. Take with food.   DOLUTEGRAVIR ([name]) 50 MG TABLET    Take 1 tablet (50 mg total) by mouth daily.   FENOFIBRATE (TRICOR) 145 MG TABLET    TAKE 1 TABLET BY MOUTH EVERY DAY   GABAPENTIN (NEURONTIN) 400 MG  CAPSULE    TAKE ONE CAPSULE BY MOUTH FOUR TIMES DAILY   MORPHINE (MS CONTIN) 60 MG 12 HR TABLET    Take 2 tablets (120 mg total) by mouth every 12 (twelve) hours.   OXYCODONE (OXY IR/ROXICODONE) 5 MG IMMEDIATE RELEASE TABLET    Take 1 tablet (5 mg total) by mouth every 6 (six) hours as needed for severe pain.   POTASSIUM CHLORIDE ER 20 MEQ TBCR    Take 20 mEq by mouth 2 (two) times daily.   POTASSIUM CHLORIDE SA (K-DUR,KLOR-CON) 20 MEQ TABLET    TAKE 1 TABLET BY MOUTH EVERY DAY   TENOFOVIR ([name]) 300 MG TABLET    Take 1 tablet (300 mg total) by mouth daily.   WARFARIN (COUMADIN) 5 MG TABLET    Take as directed by anticoagulation clinic provider.  Modified Medications   Modified Medication Previous Medication   ENSURE (ENSURE) ENSURE (ENSURE)      Take 1 Can by mouth 3 (three) times daily between meals.    Take 1 Can by mouth 3 (three) times daily between meals.  Discontinued Medications   No medications on file    Subjective: [name] is in for his routine HIV follow-up visit. He states that he's been under a great deal of stress recently. His mother has had several siblings and her husband died recently. He has been traveling back and forth to  [place], [place] to help her. As a result of the stress he has not been eating well and has been losing weight. He is still living with his daughter hearing [name] and he does not like the healthy food that she prepares. He states that he eats well when he is back home with his mother because she fixes old timey food. He never misses any doses of his HIV medications. He has not had any problems with abdominal pain, nausea, vomiting, diarrhea or constipation.  Review of Systems: Pertinent items are noted in HPI.  Past Medical History  Diagnosis Date  . History of chicken pox   . Hyperlipidemia   . History of DVT of lower extremity     right  . Left hip pain 03/09/2013  . Preventative health care 03/09/2013    History  Substance Use  Topics  . Smoking status: Current Every Day Smoker -- 0.50 packs/day for 36 years    Types: Cigarettes  . Smokeless tobacco: Never Used  . Alcohol Use: No    Family History  Problem Relation Age of Onset  . Arthritis Mother   . Kidney disease Maternal Grandfather     Allergies  Allergen Reactions  . Dapsone     REACTION: fever  . Sulfamethoxazole-Trimethoprim     REACTION: fever    Objective: Temp: 98.2 F (36.8 C) (06/07 0947) Temp Source: Oral (06/07 0947) BP: 130/65 mmHg (06/07 0947) Pulse Rate: 85 (06/07 0947) Body mass index is 17.48 kg/(m^2).  General: His weight is down 10 pounds over the past year to 125 Oral: Edentulous. No oropharyngeal lesions Skin: No rash Lymph nodes: No palpable adenopathy Lungs: Clear Cor: Regular S1 and S2 with no murmurs Abdomen: Soft and nontender with quiet bowel sounds Mood: Normally does not appear anxious or depressed  Lab Results Lab Results  Component Value Date   WBC 7.6 08/31/2014   HGB 13.6 08/31/2014   HCT 39.3 08/31/2014   MCV 89.3 08/31/2014   PLT 498* 08/31/2014    Lab Results  Component Value Date   CREATININE 1.29 03/18/2015   BUN 5* 03/18/2015   NA 133* 03/18/2015   K 3.5 03/18/2015   CL 104 03/18/2015   CO2 23 03/18/2015    Lab Results  Component Value Date   ALT 25 08/31/2014   AST 34 08/31/2014   ALKPHOS 85 08/31/2014   BILITOT 1.5* 08/31/2014    Lab Results  Component Value Date   CHOL 146 08/31/2014   HDL 39* 08/31/2014   LDLCALC 89 08/31/2014   TRIG 89 08/31/2014   CHOLHDL 3.7 08/31/2014    Lab Results HIV 1 RNA QUANT (copies/mL)  Date Value  03/12/2015 <20  08/31/2014 <20  04/28/2014 <20   CD4 T CELL ABS (/uL)  Date Value  03/12/2015 1210  08/31/2014 710  04/28/2014 840     Assessment: His HIV infection remains under excellent control.  He is had significant weight loss which he relates to his recent stress. He had a good response to Megace several years ago and I will  restart it now and see him back in one month.  Plan: 1. Continue current anti-retroviral regimen 2. Restart Megace 3. Follow-up in one month   Michel Bickers, MD [facility] [place] [phone] pager   [phone] cell 04/06/2015, 10:15 AM

## 2015-04-19 ENCOUNTER — Ambulatory Visit (INDEPENDENT_AMBULATORY_CARE_PROVIDER_SITE_OTHER): Payer: Medicare Other | Admitting: Internal Medicine

## 2015-04-19 ENCOUNTER — Encounter: Payer: Self-pay | Admitting: Internal Medicine

## 2015-04-19 ENCOUNTER — Ambulatory Visit (INDEPENDENT_AMBULATORY_CARE_PROVIDER_SITE_OTHER): Payer: Medicare Other | Admitting: Pharmacist

## 2015-04-19 ENCOUNTER — Other Ambulatory Visit: Payer: Self-pay | Admitting: *Deleted

## 2015-04-19 VITALS — BP 130/63 | HR 72 | Temp 98.1°F | Ht 72.0 in | Wt 127.5 lb

## 2015-04-19 DIAGNOSIS — F17210 Nicotine dependence, cigarettes, uncomplicated: Secondary | ICD-10-CM | POA: Diagnosis not present

## 2015-04-19 DIAGNOSIS — I749 Embolism and thrombosis of unspecified artery: Secondary | ICD-10-CM

## 2015-04-19 DIAGNOSIS — Z7982 Long term (current) use of aspirin: Secondary | ICD-10-CM | POA: Diagnosis not present

## 2015-04-19 DIAGNOSIS — Z7901 Long term (current) use of anticoagulants: Secondary | ICD-10-CM | POA: Diagnosis not present

## 2015-04-19 DIAGNOSIS — Z86718 Personal history of other venous thrombosis and embolism: Secondary | ICD-10-CM

## 2015-04-19 DIAGNOSIS — R634 Abnormal weight loss: Secondary | ICD-10-CM | POA: Diagnosis not present

## 2015-04-19 DIAGNOSIS — G546 Phantom limb syndrome with pain: Secondary | ICD-10-CM

## 2015-04-19 DIAGNOSIS — E876 Hypokalemia: Secondary | ICD-10-CM | POA: Diagnosis not present

## 2015-04-19 DIAGNOSIS — G8929 Other chronic pain: Secondary | ICD-10-CM

## 2015-04-19 DIAGNOSIS — Z89611 Acquired absence of right leg above knee: Secondary | ICD-10-CM | POA: Diagnosis not present

## 2015-04-19 LAB — BASIC METABOLIC PANEL WITH GFR
BUN: 9 mg/dL (ref 6–23)
CALCIUM: 8.7 mg/dL (ref 8.4–10.5)
CO2: 19 meq/L (ref 19–32)
CREATININE: 1.1 mg/dL (ref 0.50–1.35)
Chloride: 106 mEq/L (ref 96–112)
GFR, EST AFRICAN AMERICAN: 88 mL/min
GFR, EST NON AFRICAN AMERICAN: 76 mL/min
Glucose, Bld: 90 mg/dL (ref 70–99)
Potassium: 3.5 mEq/L (ref 3.5–5.3)
Sodium: 133 mEq/L — ABNORMAL LOW (ref 135–145)

## 2015-04-19 LAB — POCT INR: INR: 2.4

## 2015-04-19 MED ORDER — MORPHINE SULFATE ER 60 MG PO TBCR: 120.0000 mg | EXTENDED_RELEASE_TABLET | Freq: Two times a day (BID) | ORAL | Status: DC

## 2015-04-19 MED ORDER — OXYCODONE HCL 5 MG PO TABS: 5.0000 mg | ORAL_TABLET | Freq: Four times a day (QID) | ORAL | Status: DC | PRN

## 2015-04-19 NOTE — Progress Notes (Signed)
[facility] Subjective:   Patient ID: [name] male   DOB: 1961-04-10 54 y.o.   MRN: [id number]  HPI: [name] is a 54 y.o. male with a PMH detailed below who presents for follow up of hypokalemia and weight loss.  Since he was last seen here was seen by dr Megan Salon for his HIV follow up.  At that time he was also restarted on megace.  He is now up 2 pounds.  He feels that his appetite is returning and notes he ate a large meal last night.    His chief complaint right now is that his prostesis does not fit well and is causing pain, he reports that it does not fit due to his weight loss and that it is falling appart he has had to use duck tape to keep parts of it together.  Wt Readings from Last 5 Encounters:  04/19/15 127 lb 8 oz (57.834 kg)  04/06/15 125 lb 4 oz (56.813 kg)  03/18/15 126 lb 3.2 oz (57.244 kg)  11/02/14 130 lb 12.8 oz (59.33 kg)  10/12/14 131 lb 3.2 oz (59.512 kg)     Past Medical History  Diagnosis Date  . History of chicken pox   . Hyperlipidemia   . History of DVT of lower extremity     right  . Left hip pain 03/09/2013  . Preventative health care 03/09/2013   Current Outpatient Prescriptions  Medication Sig Dispense Refill  . aspirin 81 MG tablet Take 81 mg by mouth daily.      Marland Kitchen atazanavir-cobicistat (EVOTAZ) 300-150 MG per tablet Take 1 tablet by mouth daily. Swallow whole. Do NOT crush, cut or chew tablet. Take with food. 30 tablet 11  . dolutegravir ([name]) 50 MG tablet Take 1 tablet (50 mg total) by mouth daily. 30 tablet 11  . ENSURE (ENSURE) Take 1 Can by mouth 3 (three) times daily between meals. 237 mL prn  . fenofibrate (TRICOR) 145 MG tablet TAKE 1 TABLET BY MOUTH EVERY DAY 30 tablet 5  . gabapentin (NEURONTIN) 400 MG capsule TAKE ONE CAPSULE BY MOUTH FOUR TIMES DAILY 120 capsule 11  . megestrol (MEGACE ORAL) 40 MG/ML suspension Take 20 mLs (800 mg total) by mouth daily. 600 mL 5  . morphine (MS CONTIN) 60 MG 12  hr tablet Take 2 tablets (120 mg total) by mouth every 12 (twelve) hours. 120 tablet 0  . oxyCODONE (OXY IR/ROXICODONE) 5 MG immediate release tablet Take 1 tablet (5 mg total) by mouth every 6 (six) hours as needed for severe pain. 60 tablet 0  . Potassium Chloride ER 20 MEQ TBCR Take 20 mEq by mouth 2 (two) times daily. 60 tablet 0  . potassium chloride SA (K-DUR,KLOR-CON) 20 MEQ tablet TAKE 1 TABLET BY MOUTH EVERY DAY 30 tablet 2  . tenofovir ([name]) 300 MG tablet Take 1 tablet (300 mg total) by mouth daily. 30 tablet 11  . warfarin (COUMADIN) 5 MG tablet Take as directed by anticoagulation clinic provider. 90 tablet 3   No current facility-administered medications for this visit.   Family History  Problem Relation Age of Onset  . Arthritis Mother   . Kidney disease Maternal Grandfather    History   Social History  . Marital Status: Married    Spouse Name: N/A  . Number of Children: 3  . Years of Education: N/A   Occupational History  .     Social History Main Topics  . Smoking status: Current  Every Day Smoker -- 0.50 packs/day for 36 years    Types: Cigarettes  . Smokeless tobacco: Never Used     Comment: ABOUT 1/2PPD  . Alcohol Use: No  . Drug Use: No  . Sexual Activity: Not Currently     Comment: declined condoms   Other Topics Concern  . None   Social History Narrative   Review of Systems: Review of Systems  Constitutional: Negative for fever, chills and malaise/fatigue.  Respiratory: Negative for cough.   Cardiovascular: Negative for chest pain.  Gastrointestinal: Negative for heartburn, nausea, vomiting and abdominal pain.  Neurological: Negative for dizziness and headaches.     Objective:  Physical Exam: Filed Vitals:   04/19/15 1422  BP: 130/63  Pulse: 72  Temp: 98.1 F (36.7 C)  TempSrc: Oral  Height: 6' (1.829 m)  Weight: 127 lb 8 oz (57.834 kg)  SpO2: 100%  Physical Exam  Constitutional:  Thin with temporal wasting  Eyes: Conjunctivae are  normal.  Cardiovascular: Normal rate, regular rhythm and normal heart sounds.   Pulmonary/Chest: Effort normal and breath sounds normal.  Abdominal: Soft. Bowel sounds are normal.  Musculoskeletal:  prostesis to right leg, AKA.  Prostesis does have duck tape on it and appears to be somewhat loose. Patient in wheel chair, gait no assessed.  Skin: Skin is warm and dry.  Nursing note and vitals reviewed.   Assessment & Plan:  Case discussed with Dr. Dareen Piano  Embolism and thrombosis of artery He is on chronic coumadin after a severe DVT caused a right AKA.  I did ask him about the megace as it has a small association with DVT, he apparently was not on megace when he had the DVT and has been on megace after that time without issue.  He does want to stay on megace at this time.  Unintentional weight loss - He has been placed on megace by Dr Megan Salon and reports appetite is improving.  Status post above knee amputation prostesis appears in poor shape, given written scrip for replacement.  Hypokalemia Attributed to poor dietary intake, will recheck today.  Returned within normal limits.    Medications Ordered No orders of the defined types were placed in this encounter.   Other Orders Orders Placed This Encounter  Procedures  . BMP with Estimated GFR ([id number])   Follow Up: Return in about 6 months (around 10/19/2015).

## 2015-04-19 NOTE — Patient Instructions (Signed)
General Instructions:   Please bring your medicines with you each time you come to clinic.  Medicines may include prescription medications, over-the-counter medications, herbal remedies, eye drops, vitamins, or other pills.   Progress Toward Treatment Goals:  Treatment Goal 10/08/2014  Stop smoking smoking the same amount    Self Care Goals & Plans:  Self Care Goal 04/19/2015  Manage my medications take my medicines as prescribed; refill my medications on time; bring my medications to every visit  Monitor my health keep track of my weight  Eat healthy foods eat foods that are low in salt; drink diet soda or water instead of juice or soda  Be physically active (No Data)  Stop smoking (No Data)    No flowsheet data found.   Care Management & Community Referrals:  No flowsheet data found.

## 2015-04-19 NOTE — Patient Instructions (Signed)
Patient instructed to take medications as defined in the Anti-coagulation Track section of this encounter.  Patient instructed to take today's dose.  Patient verbalized understanding of these instructions.    

## 2015-04-19 NOTE — Progress Notes (Signed)
Anti-Coagulation Progress Note  [name] is a 54 y.o. male who is currently on an anti-coagulation regimen.    RECENT RESULTS: Recent results are below, the most recent result is correlated with a dose of 22.5 mg. per week: Lab Results  Component Value Date   INR 2.40 04/19/2015   INR 4.00 04/05/2015   INR 2.10 03/08/2015   PROTIME 38.4* 04/01/2012    ANTI-COAG DOSE: Anticoagulation Dose Instructions as of 04/19/2015      Sun Mon Tue Wed Thu Fri Sat   New Dose 2.5 mg 2.5 mg 5 mg 2.5 mg 2.5 mg 5 mg 2.5 mg       ANTICOAG SUMMARY: Anticoagulation Episode Summary    Current INR goal 2.0-3.0  Next INR check 05/17/2015  INR from last check 2.40 (04/19/2015)  Weekly max dose   Target end date Indefinite  [facility]  Preferred lab   Send INR reminders to ANTICOAG IMP   Indications  Embolism and thrombosis of artery [I74.9] Long term current use of anticoagulant [Z79.01]        Comments       Anticoagulation Care Providers    Provider Role Specialty Phone number   Michel Bickers, MD  Infectious Diseases [phone]      ANTICOAG TODAY: Anticoagulation Summary as of 04/19/2015    INR goal 2.0-3.0  Selected INR 2.40 (04/19/2015)  Next INR check 05/17/2015  Target end date Indefinite   Indications  Embolism and thrombosis of artery [I74.9] Long term current use of anticoagulant [Z79.01]      Anticoagulation Episode Summary    [facility]   Preferred lab    Send INR reminders to ANTICOAG IMP   Comments     Anticoagulation Care Providers    Provider Role Specialty Phone number   Michel Bickers, MD  Infectious Diseases [phone]      PATIENT INSTRUCTIONS: Patient Instructions  Patient instructed to take medications as defined in the Anti-coagulation Track section of this encounter.  Patient instructed to take today's dose.  Patient verbalized understanding of these instructions.       FOLLOW-UP Return in 4  weeks (on 05/17/2015) for Follow up INR at 2:00PM.  Jorene Guest, [name] Pharm.D., CACP

## 2015-04-20 NOTE — Progress Notes (Signed)
INTERNAL MEDICINE TEACHING ATTENDING ADDENDUM - Aldine Contes M.D  Duration- indefinite, Indication- arterial thromboembolism, DVT, INR- therapeutic. Agree with pharmacy recommendations as outlined in their note.

## 2015-04-20 NOTE — Assessment & Plan Note (Signed)
He is on chronic coumadin after a severe DVT caused a right AKA.  I did ask him about the megace as it has a small association with DVT, he apparently was not on megace when he had the DVT and has been on megace after that time without issue.  He does want to stay on megace at this time.

## 2015-04-20 NOTE — Assessment & Plan Note (Signed)
prostesis appears in poor shape, given written scrip for replacement.

## 2015-04-20 NOTE — Assessment & Plan Note (Signed)
-   He has been placed on megace by Dr Megan Salon and reports appetite is improving.

## 2015-04-20 NOTE — Progress Notes (Signed)
INTERNAL MEDICINE TEACHING ATTENDING ADDENDUM - Shomari Scicchitano, MD: I reviewed and discussed at the time of visit with the resident Dr. Hoffman, the patient's medical history, physical examination, diagnosis and results of pertinent tests and treatment and I agree with the patient's care as documented.  

## 2015-04-20 NOTE — Assessment & Plan Note (Signed)
Attributed to poor dietary intake, will recheck today.  Returned within normal limits.

## 2015-05-02 ENCOUNTER — Other Ambulatory Visit: Payer: Self-pay | Admitting: Internal Medicine

## 2015-05-02 DIAGNOSIS — G609 Hereditary and idiopathic neuropathy, unspecified: Secondary | ICD-10-CM

## 2015-05-05 DIAGNOSIS — R112 Nausea with vomiting, unspecified: Secondary | ICD-10-CM | POA: Diagnosis not present

## 2015-05-05 DIAGNOSIS — N390 Urinary tract infection, site not specified: Secondary | ICD-10-CM | POA: Diagnosis not present

## 2015-05-05 DIAGNOSIS — D72829 Elevated white blood cell count, unspecified: Secondary | ICD-10-CM | POA: Diagnosis not present

## 2015-05-05 DIAGNOSIS — D3502 Benign neoplasm of left adrenal gland: Secondary | ICD-10-CM | POA: Diagnosis not present

## 2015-05-05 DIAGNOSIS — E876 Hypokalemia: Secondary | ICD-10-CM | POA: Diagnosis not present

## 2015-05-05 DIAGNOSIS — K760 Fatty (change of) liver, not elsewhere classified: Secondary | ICD-10-CM | POA: Diagnosis not present

## 2015-05-05 DIAGNOSIS — E871 Hypo-osmolality and hyponatremia: Secondary | ICD-10-CM | POA: Diagnosis not present

## 2015-05-06 DIAGNOSIS — D72829 Elevated white blood cell count, unspecified: Secondary | ICD-10-CM | POA: Diagnosis not present

## 2015-05-06 DIAGNOSIS — E876 Hypokalemia: Secondary | ICD-10-CM | POA: Diagnosis not present

## 2015-05-06 DIAGNOSIS — N390 Urinary tract infection, site not specified: Secondary | ICD-10-CM | POA: Diagnosis not present

## 2015-05-06 DIAGNOSIS — R112 Nausea with vomiting, unspecified: Secondary | ICD-10-CM | POA: Diagnosis not present

## 2015-05-07 DIAGNOSIS — N390 Urinary tract infection, site not specified: Secondary | ICD-10-CM | POA: Diagnosis not present

## 2015-05-07 DIAGNOSIS — R112 Nausea with vomiting, unspecified: Secondary | ICD-10-CM | POA: Diagnosis not present

## 2015-05-07 DIAGNOSIS — D72829 Elevated white blood cell count, unspecified: Secondary | ICD-10-CM | POA: Diagnosis not present

## 2015-05-07 DIAGNOSIS — E876 Hypokalemia: Secondary | ICD-10-CM | POA: Diagnosis not present

## 2015-05-10 ENCOUNTER — Ambulatory Visit (INDEPENDENT_AMBULATORY_CARE_PROVIDER_SITE_OTHER): Payer: Medicare Other | Admitting: Pharmacist

## 2015-05-10 DIAGNOSIS — Z7901 Long term (current) use of anticoagulants: Secondary | ICD-10-CM | POA: Diagnosis present

## 2015-05-10 DIAGNOSIS — I749 Embolism and thrombosis of unspecified artery: Secondary | ICD-10-CM

## 2015-05-10 LAB — POCT INR: INR: 1.1

## 2015-05-10 NOTE — Patient Instructions (Signed)
Patient instructed to take medications as defined in the Anti-coagulation Track section of this encounter.  Patient instructed to take today's dose.  Patient verbalized understanding of these instructions.    

## 2015-05-10 NOTE — Progress Notes (Signed)
Anti-Coagulation Progress Note  [name] is a 54 y.o. male who is currently on an anti-coagulation regimen.    RECENT RESULTS: Recent results are below, the most recent result is correlated with a dose of 12.5 mg. per week:  Recent hospitalization July 6-8, 2016 at [facility] (as documented in [place]) for UTI, placed upon diflucan and levoquin which he has been taking since discharge. He has NOT been taking warfarin since discharge (INR upon admission documented as 3.07; 2nd day of hospitalization INR = 3.77; last day of hospitalization INR = 1.92). INR today reflects no warfarin since discharge. Given the antibiotics he has been placed upon--will recommence at 2.5mg  warfarin PO qd and re-evaluate response on the antibiotics on Friday 15-JUL-16 at 1000h.  Patient has been  Instructed to RTC or ED if he were to have blood in urine, stool, throwing up blood, coughing up blood, increased bruising---bleeding anywhere. He acknowledges this and repeats back to me.  Lab Results  Component Value Date   INR 1.10 05/10/2015   INR 2.40 04/19/2015   INR 4.00 04/05/2015   PROTIME 38.4* 04/01/2012    ANTI-COAG DOSE: Anticoagulation Dose Instructions as of 05/10/2015      Sun Mon Tue Wed Thu Fri Sat   New Dose 2.5 mg 2.5 mg 5 mg 2.5 mg 2.5 mg 5 mg 2.5 mg       ANTICOAG SUMMARY: Anticoagulation Episode Summary    Current INR goal 2.0-3.0  Next INR check 05/17/2015  INR from last check 1.10! (05/10/2015)  Weekly max dose   Target end date Indefinite  [facility]  Preferred lab   Send INR reminders to ANTICOAG IMP   Indications  Embolism and thrombosis of artery [I74.9] Long term current use of anticoagulant [Z79.01]        Comments       Anticoagulation Care Providers    Provider Role Specialty Phone number   Michel Bickers, MD  Infectious Diseases [phone]      ANTICOAG TODAY: Anticoagulation Summary as of 05/10/2015    INR  goal 2.0-3.0  Selected INR 1.10! (05/10/2015)  Next INR check 05/17/2015  Target end date Indefinite   Indications  Embolism and thrombosis of artery [I74.9] Long term current use of anticoagulant [Z79.01]      Anticoagulation Episode Summary    [facility] check [facility]   Preferred lab    Send INR reminders to ANTICOAG IMP   Comments     Anticoagulation Care Providers    Provider Role Specialty Phone number   Michel Bickers, MD  Infectious Diseases [phone]      PATIENT INSTRUCTIONS: Patient Instructions  Patient instructed to take medications as defined in the Anti-coagulation Track section of this encounter.  Patient instructed to take today's dose.  Patient verbalized understanding of these instructions.       FOLLOW-UP Return in 4 days (on 05/14/2015) for Follow up INR at 1000h.  Jorene Guest, [name] Pharm.D., CACP

## 2015-05-11 ENCOUNTER — Ambulatory Visit: Payer: Medicare Other | Admitting: Internal Medicine

## 2015-05-11 NOTE — Progress Notes (Signed)
I have reviewed Dr. Gladstone Pih note.  Plan per his note.

## 2015-05-13 ENCOUNTER — Ambulatory Visit (INDEPENDENT_AMBULATORY_CARE_PROVIDER_SITE_OTHER): Payer: Medicare Other | Admitting: Internal Medicine

## 2015-05-13 ENCOUNTER — Ambulatory Visit: Payer: Medicare Other | Admitting: Internal Medicine

## 2015-05-13 ENCOUNTER — Other Ambulatory Visit: Payer: Self-pay | Admitting: Internal Medicine

## 2015-05-13 ENCOUNTER — Encounter: Payer: Self-pay | Admitting: Internal Medicine

## 2015-05-13 VITALS — BP 132/69 | HR 108 | Temp 98.0°F | Ht 72.0 in | Wt 120.7 lb

## 2015-05-13 DIAGNOSIS — N3001 Acute cystitis with hematuria: Secondary | ICD-10-CM

## 2015-05-13 DIAGNOSIS — M87851 Other osteonecrosis, right femur: Secondary | ICD-10-CM

## 2015-05-13 DIAGNOSIS — B9689 Other specified bacterial agents as the cause of diseases classified elsewhere: Secondary | ICD-10-CM

## 2015-05-13 DIAGNOSIS — N390 Urinary tract infection, site not specified: Secondary | ICD-10-CM

## 2015-05-13 DIAGNOSIS — M87852 Other osteonecrosis, left femur: Secondary | ICD-10-CM | POA: Diagnosis not present

## 2015-05-13 DIAGNOSIS — Z7982 Long term (current) use of aspirin: Secondary | ICD-10-CM

## 2015-05-13 DIAGNOSIS — M87059 Idiopathic aseptic necrosis of unspecified femur: Secondary | ICD-10-CM

## 2015-05-13 DIAGNOSIS — E279 Disorder of adrenal gland, unspecified: Secondary | ICD-10-CM | POA: Diagnosis not present

## 2015-05-13 DIAGNOSIS — E278 Other specified disorders of adrenal gland: Secondary | ICD-10-CM

## 2015-05-13 NOTE — Patient Instructions (Signed)
1. I am glad you are feeling better.  Your hip is necrosis is a serious problem so you need to follow-up with the orthopedic surgeon as soon as possible. We will call you with the orthopedic appointment date.   2. Please take all medications as prescribed.    3. If you have worsening of your symptoms or new symptoms arise, please call the clinic ([phone]), or go to the ER immediately if symptoms are severe.   Please come back to see your primary doctor, Dr. Lovena Le in 1 month.

## 2015-05-13 NOTE — Progress Notes (Signed)
Subjective:    Patient ID: [name], male    DOB: 26-Feb-1961, 54 y.o.   MRN: [id number]  HPI Comments: [name] is a 54 year old male with PMH as below here for hospital follow-up.  Please see problem based charting for assessment and plan.        Past Medical History  Diagnosis Date  . History of chicken pox   . Hyperlipidemia   . History of DVT of lower extremity     right  . Left hip pain 03/09/2013  . Preventative health care 03/09/2013   Current Outpatient Prescriptions on File Prior to Visit  Medication Sig Dispense Refill  . aspirin 81 MG tablet Take 81 mg by mouth daily.      Marland Kitchen atazanavir-cobicistat (EVOTAZ) 300-150 MG per tablet Take 1 tablet by mouth daily. Swallow whole. Do NOT crush, cut or chew tablet. Take with food. 30 tablet 11  . dolutegravir ([name]) 50 MG tablet Take 1 tablet (50 mg total) by mouth daily. 30 tablet 11  . ENSURE (ENSURE) Take 1 Can by mouth 3 (three) times daily between meals. 237 mL prn  . fenofibrate (TRICOR) 145 MG tablet TAKE 1 TABLET BY MOUTH EVERY DAY 30 tablet 5  . fluconazole (DIFLUCAN) 100 MG tablet Take 100 mg by mouth daily.    Marland Kitchen gabapentin (NEURONTIN) 400 MG capsule TAKE ONE CAPSULE BY MOUTH FOUR TIMES DAILY 120 capsule 5  . levofloxacin (LEVAQUIN) 500 MG tablet Take 500 mg by mouth daily.    . megestrol (MEGACE ORAL) 40 MG/ML suspension Take 20 mLs (800 mg total) by mouth daily. 600 mL 5  . morphine (MS CONTIN) 60 MG 12 hr tablet Take 2 tablets (120 mg total) by mouth every 12 (twelve) hours. 120 tablet 0  . oxyCODONE (OXY IR/ROXICODONE) 5 MG immediate release tablet Take 1 tablet (5 mg total) by mouth every 6 (six) hours as needed for severe pain. 60 tablet 0  . Potassium Chloride ER 20 MEQ TBCR Take 20 mEq by mouth 2 (two) times daily. 60 tablet 0  . potassium chloride SA (K-DUR,KLOR-CON) 20 MEQ tablet TAKE 1 TABLET BY MOUTH EVERY DAY 30 tablet 2  . tenofovir ([name]) 300 MG tablet Take 1 tablet (300 mg total) by mouth daily.  30 tablet 11  . warfarin (COUMADIN) 5 MG tablet Take as directed by anticoagulation clinic provider. 90 tablet 3   No current facility-administered medications on file prior to visit.    Review of Systems  Constitutional: Negative for fever and chills.  Respiratory: Negative for shortness of breath.   Cardiovascular: Negative for chest pain and palpitations.  Gastrointestinal: Negative for nausea, vomiting, diarrhea and constipation.  Genitourinary: Negative for dysuria, frequency, hematuria and flank pain.  Musculoskeletal: Negative for myalgias.       Filed Vitals:   05/13/15 1326  BP: 132/69  Pulse: 108  Temp: 98 F (36.7 C)  TempSrc: Oral  Height: 6' (1.829 m)  Weight: 120 lb 11.2 oz (54.749 kg)  SpO2: 100%    Objective:   Physical Exam  Constitutional: He is oriented to person, place, and time. No distress.  cachectic  HENT:  Head: Normocephalic and atraumatic.  Mouth/Throat: Oropharynx is clear and moist. No oropharyngeal exudate.  Eyes: EOM are normal. Pupils are equal, round, and reactive to light.  Neck: Neck supple.  Cardiovascular: Normal rate, regular rhythm and normal heart sounds.  Exam reveals no gallop and no friction rub.   No murmur heard. Pulmonary/Chest:  Effort normal and breath sounds normal. No respiratory distress. He has no wheezes. He has no rales.  Abdominal: Soft. Bowel sounds are normal. He exhibits no distension and no mass. There is no tenderness. There is no rebound and no guarding.  Musculoskeletal: Normal range of motion. He exhibits no edema or tenderness.  Right AKA stump skin intact; no ulcers; B/L hips palpated and are non-tender to palpation No CVAT  Neurological: He is alert and oriented to person, place, and time. No cranial nerve deficit.  Skin: Skin is warm. He is not diaphoretic.  Psychiatric: He has a normal mood and affect. His behavior is normal.  Vitals reviewed.         Assessment & Plan:  Please see problem based  charting for assessment and plan.

## 2015-05-14 ENCOUNTER — Ambulatory Visit (INDEPENDENT_AMBULATORY_CARE_PROVIDER_SITE_OTHER): Payer: Medicare Other | Admitting: Pharmacist

## 2015-05-14 DIAGNOSIS — I749 Embolism and thrombosis of unspecified artery: Secondary | ICD-10-CM

## 2015-05-14 DIAGNOSIS — M87059 Idiopathic aseptic necrosis of unspecified femur: Secondary | ICD-10-CM | POA: Insufficient documentation

## 2015-05-14 DIAGNOSIS — N390 Urinary tract infection, site not specified: Secondary | ICD-10-CM | POA: Insufficient documentation

## 2015-05-14 DIAGNOSIS — Z7901 Long term (current) use of anticoagulants: Secondary | ICD-10-CM

## 2015-05-14 DIAGNOSIS — E278 Other specified disorders of adrenal gland: Secondary | ICD-10-CM | POA: Insufficient documentation

## 2015-05-14 LAB — POCT INR: INR: 2.1

## 2015-05-14 NOTE — Assessment & Plan Note (Signed)
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  Patient denies pain in these areas.  He left hospital AMA prior to eval by ortho. - refer to orthopedics in [place] (he lives in [place] but would like to keep all of his doctors in [place])

## 2015-05-14 NOTE — Patient Instructions (Signed)
Patient instructed to take medications as defined in the Anti-coagulation Track section of this encounter.  Patient instructed to take today's dose.  Patient verbalized understanding of these instructions.    

## 2015-05-14 NOTE — Assessment & Plan Note (Addendum)
Per Care Everywhere, he was hospitalized in [facility] for UTI 07/06-07/08.  He left AMA.  He reports dysuria, hematuria and N/V that resolved after admission.  He says the hematuria happened about three times and he thought he was going to pass a stone.  He is on Levofloxacin and has four more days of treatment.  He is no longer seeing blood in his urine.  Symptoms resolved on antibiotic.  He says he felt better after getting fluids in the hospital.  Asymptomatic today. - complete Levofloxacin - he was seen in anti-coag clinic (w/ Dr. Elie Confer) earlier this week with subtherapeutic INR (he is on levofloxacin); he will follow-up with Dr. Elie Confer for re-check tomorrow

## 2015-05-14 NOTE — Assessment & Plan Note (Addendum)
Left adrenal mass and left renal mass on CT abd done at [facility] 05/05/15.  The left adrenal mass had higher attenuation than would be expected for a cyst so radiology recommends follow-up with non-emergent MRI.  He should be referred for this in the near future.  I have referred him to ortho today for avascular necrosis of B/L femurs so will let him take care of that issue first.  He will follow-up with his PCP.

## 2015-05-14 NOTE — Progress Notes (Signed)
Anti-Coagulation Progress Note  [name] is a 54 y.o. male who is currently on an anti-coagulation regimen.    RECENT RESULTS: Recent results are below, the most recent result is correlated with a dose of 10 mg. Since last visit. At last visit, patient indicated he had been seen by outside provider [facility]) for UTI--confirmed at [place]. He was placed upon diflucan for oral thrush that he completed today. He was placed upon ciprofloxicin for diagnosis of UTI. He has 3 days remaining. At [facility] visit, his INR was only 1.1 (I reviewed his INR responses during his hospitalization at [facility] and they had "held" his warfarin. Given that he was on cipro and diflucan and would continue upon these, at the last appointment his daily warfarin dose was empirically reduced in anticipation of a known drug-drug interaction between warfarin/cipro/diflucan--which can result in a marked hypoprothrombinemic response. Today's INR = 2.1. Given that he has completed his diflucan and has only 3 days remaining of cipro, he will be recommenced upon his 25mg /week regimen that has previously given an appropriate INR response for this weekly dose. Patient was reminded to call clinic, myself, or report to the ED if he were to have blood in urine, stool, nose-bleeds, cough up blood, throw up blood, have increased bruising, unusual abdominal pains or "worst-head-ache of life"--with any projectile emesis or visual disturbances--or, any new medications prescribed by an outside system provider (call me to assess potential of DDI). He agrees and can repeat these instructions.  Lab Results  Component Value Date   INR 2.10 05/14/2015   INR 1.10 05/10/2015   INR 2.40 04/19/2015   PROTIME 38.4* 04/01/2012    ANTI-COAG DOSE: Anticoagulation Dose Instructions as of 05/14/2015      Sun Mon Tue Wed Thu Fri Sat   New Dose 2.5 mg 2.5 mg 5 mg 2.5 mg 2.5 mg 5 mg 2.5 mg       ANTICOAG  SUMMARY: Anticoagulation Episode Summary    Current INR goal 2.0-3.0  Next INR check 05/24/2015  INR from last check 2.10 (05/14/2015)  Weekly max dose   Target end date Indefinite  [facility]  Preferred lab   Send INR reminders to ANTICOAG IMP   Indications  Embolism and thrombosis of artery [I74.9] Long term current use of anticoagulant [Z79.01]        Comments       Anticoagulation Care Providers    Provider Role Specialty Phone number   Michel Bickers, MD  Infectious Diseases [phone]      ANTICOAG TODAY: Anticoagulation Summary as of 05/14/2015    INR goal 2.0-3.0  Selected INR 2.10 (05/14/2015)  Next INR check 05/24/2015  Target end date Indefinite   Indications  Embolism and thrombosis of artery [I74.9] Long term current use of anticoagulant [Z79.01]      Anticoagulation Episode Summary    [facility]   Preferred lab    Send INR reminders to ANTICOAG IMP   Comments     Anticoagulation Care Providers    Provider Role Specialty Phone number   Michel Bickers, MD  Infectious Diseases [phone]      PATIENT INSTRUCTIONS: Patient Instructions  Patient instructed to take medications as defined in the Anti-coagulation Track section of this encounter.  Patient instructed to take today's dose.  Patient verbalized understanding of these instructions.       FOLLOW-UP Return in 10 days (on 05/24/2015) for Follow up INR at  0900hJorene Guest, [name] Pharm.D., CACP

## 2015-05-17 ENCOUNTER — Other Ambulatory Visit: Payer: Self-pay | Admitting: *Deleted

## 2015-05-17 ENCOUNTER — Ambulatory Visit: Payer: Medicare Other

## 2015-05-17 DIAGNOSIS — G8929 Other chronic pain: Secondary | ICD-10-CM

## 2015-05-17 DIAGNOSIS — G546 Phantom limb syndrome with pain: Secondary | ICD-10-CM

## 2015-05-17 MED ORDER — OXYCODONE HCL 5 MG PO TABS: 5.0000 mg | ORAL_TABLET | Freq: Four times a day (QID) | ORAL | Status: DC | PRN

## 2015-05-17 MED ORDER — MORPHINE SULFATE ER 60 MG PO TBCR: 120.0000 mg | EXTENDED_RELEASE_TABLET | Freq: Two times a day (BID) | ORAL | Status: DC

## 2015-05-17 NOTE — Progress Notes (Signed)
Internal Medicine Clinic Attending  Case discussed with Dr. Wilson soon after the resident saw the patient.  We reviewed the resident's history and exam and pertinent patient test results.  I agree with the assessment, diagnosis, and plan of care documented in the resident's note.  

## 2015-05-18 DIAGNOSIS — M87052 Idiopathic aseptic necrosis of left femur: Secondary | ICD-10-CM | POA: Diagnosis not present

## 2015-05-18 DIAGNOSIS — M87051 Idiopathic aseptic necrosis of right femur: Secondary | ICD-10-CM | POA: Diagnosis not present

## 2015-05-24 ENCOUNTER — Ambulatory Visit (INDEPENDENT_AMBULATORY_CARE_PROVIDER_SITE_OTHER): Payer: Medicare Other | Admitting: Pharmacist

## 2015-05-24 DIAGNOSIS — Z7901 Long term (current) use of anticoagulants: Secondary | ICD-10-CM

## 2015-05-24 DIAGNOSIS — I749 Embolism and thrombosis of unspecified artery: Secondary | ICD-10-CM | POA: Diagnosis not present

## 2015-05-24 LAB — POCT INR: INR: 2.2

## 2015-05-24 NOTE — Progress Notes (Signed)
Anti-Coagulation Progress Note  [name] is a 54 y.o. male who is currently on an anti-coagulation regimen.    RECENT RESULTS: Recent results are below, the most recent result is correlated with a dose of 22.5 mg. per week: Lab Results  Component Value Date   INR 2.20 05/24/2015   INR 2.10 05/14/2015   INR 1.10 05/10/2015   PROTIME 38.4* 04/01/2012    ANTI-COAG DOSE: Anticoagulation Dose Instructions as of 05/24/2015      Sun Mon Tue Wed Thu Fri Sat   New Dose 2.5 mg 5 mg 5 mg 5 mg 5 mg 5 mg 2.5 mg       ANTICOAG SUMMARY: Anticoagulation Episode Summary    Current INR goal 2.0-3.0  Next INR check 06/14/2015  INR from last check 2.20 (05/24/2015)  Weekly max dose   Target end date Indefinite  [facility]  Preferred lab   Send INR reminders to ANTICOAG IMP   Indications  Embolism and thrombosis of artery [I74.9] Long term current use of anticoagulant [Z79.01]        Comments       Anticoagulation Care Providers    Provider Role Specialty Phone number   Michel Bickers, MD  Infectious Diseases [phone]      ANTICOAG TODAY: Anticoagulation Summary as of 05/24/2015    INR goal 2.0-3.0  Selected INR 2.20 (05/24/2015)  Next INR check 06/14/2015  Target end date Indefinite   Indications  Embolism and thrombosis of artery [I74.9] Long term current use of anticoagulant [Z79.01]      Anticoagulation Episode Summary    [facility]   Preferred lab    Send INR reminders to ANTICOAG IMP   Comments     Anticoagulation Care Providers    Provider Role Specialty Phone number   Michel Bickers, MD  Infectious Diseases [phone]      PATIENT INSTRUCTIONS: Patient Instructions  Patient instructed to take medications as defined in the Anti-coagulation Track section of this encounter.  Patient instructed to take today's dose.  Patient verbalized understanding of these instructions.       FOLLOW-UP Return in 3 weeks  (on 06/14/2015) for Follow up INR at 0900h.  Jorene Guest, [name] Pharm.D., CACP

## 2015-05-24 NOTE — Patient Instructions (Signed)
Patient instructed to take medications as defined in the Anti-coagulation Track section of this encounter.  Patient instructed to take today's dose.  Patient verbalized understanding of these instructions.    

## 2015-05-28 NOTE — Progress Notes (Signed)
I have reviewed Dr. Gladstone Pih note.  [name] is on anticoagulation for embolus.

## 2015-06-08 ENCOUNTER — Other Ambulatory Visit: Payer: Self-pay | Admitting: Internal Medicine

## 2015-06-14 ENCOUNTER — Encounter: Payer: Self-pay | Admitting: Internal Medicine

## 2015-06-14 ENCOUNTER — Ambulatory Visit (INDEPENDENT_AMBULATORY_CARE_PROVIDER_SITE_OTHER): Payer: Medicare Other | Admitting: Internal Medicine

## 2015-06-14 ENCOUNTER — Ambulatory Visit (INDEPENDENT_AMBULATORY_CARE_PROVIDER_SITE_OTHER): Payer: Medicare Other | Admitting: Pharmacist

## 2015-06-14 ENCOUNTER — Encounter: Payer: Self-pay | Admitting: Licensed Clinical Social Worker

## 2015-06-14 VITALS — BP 136/71 | HR 97 | Temp 99.2°F | Ht 72.0 in | Wt 124.8 lb

## 2015-06-14 DIAGNOSIS — I749 Embolism and thrombosis of unspecified artery: Secondary | ICD-10-CM | POA: Diagnosis not present

## 2015-06-14 DIAGNOSIS — Z89611 Acquired absence of right leg above knee: Secondary | ICD-10-CM | POA: Diagnosis not present

## 2015-06-14 DIAGNOSIS — M87852 Other osteonecrosis, left femur: Secondary | ICD-10-CM | POA: Diagnosis present

## 2015-06-14 DIAGNOSIS — F17210 Nicotine dependence, cigarettes, uncomplicated: Secondary | ICD-10-CM

## 2015-06-14 DIAGNOSIS — F17200 Nicotine dependence, unspecified, uncomplicated: Secondary | ICD-10-CM

## 2015-06-14 DIAGNOSIS — Z7982 Long term (current) use of aspirin: Secondary | ICD-10-CM | POA: Diagnosis not present

## 2015-06-14 DIAGNOSIS — Z7901 Long term (current) use of anticoagulants: Secondary | ICD-10-CM | POA: Diagnosis present

## 2015-06-14 DIAGNOSIS — M87851 Other osteonecrosis, right femur: Secondary | ICD-10-CM | POA: Diagnosis not present

## 2015-06-14 DIAGNOSIS — M87059 Idiopathic aseptic necrosis of unspecified femur: Secondary | ICD-10-CM

## 2015-06-14 LAB — POCT INR: INR: 2

## 2015-06-14 NOTE — Assessment & Plan Note (Signed)
  Assessment: Progress toward smoking cessation:   improvement Barriers to progress toward smoking cessation:   cravings Comments: Patient reports he is down to smoking 5 cigarettes a day. Was previously smoking 1/2 PPD. He reports that his biggest trouble quitting is 2/2 cravings. He declines any aid with quitting, noting that it does not help.   Plan: Instruction/counseling given:  I counseled patient on the dangers of tobacco use, advised patient to stop smoking, and reviewed strategies to maximize success. Educational resources provided:  [employer] account manager) brochure (denies) Self management tools provided:    Medications to assist with smoking cessation:  None Patient agreed to the following self-care plans for smoking cessation:    Other plans:

## 2015-06-14 NOTE — Patient Instructions (Signed)
Thank you for coming in today  - If you continue to have issues with your hip pain, please contact your orthopedic surgeon - Follow up with your PCP, Dr. Lovena Le on 10/7 at 2:30 PM

## 2015-06-14 NOTE — Progress Notes (Signed)
Anti-Coagulation Progress Note  [name] is a 54 y.o. male who is currently on an anti-coagulation regimen.    RECENT RESULTS: Recent results are below, the most recent result is correlated with a dose of 30 mg. per week: Lab Results  Component Value Date   INR 2.00 06/14/2015   INR 2.20 05/24/2015   INR 2.10 05/14/2015   PROTIME 38.4* 04/01/2012    ANTI-COAG DOSE: Anticoagulation Dose Instructions as of 06/14/2015      [name] Tue Wed Thu Fri Sat   New Dose 5 mg 5 mg 5 mg 5 mg 5 mg 5 mg 5 mg       ANTICOAG SUMMARY: Anticoagulation Episode Summary    Current INR goal 2.0-3.0  Next INR check 07/12/2015  INR from last check 2.00 (06/14/2015)  Weekly max dose   Target end date Indefinite  [facility]  Preferred lab   Send INR reminders to ANTICOAG IMP   Indications  Embolism and thrombosis of artery [I74.9] Long term current use of anticoagulant [Z79.01]        Comments       Anticoagulation Care Providers    Provider Role Specialty Phone number   Michel Bickers, MD  Infectious Diseases [phone]      ANTICOAG TODAY: Anticoagulation Summary as of 06/14/2015    INR goal 2.0-3.0  Selected INR 2.00 (06/14/2015)  Next INR check 07/12/2015  Target end date Indefinite   Indications  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  Patient instructed to take today's dose.  Patient verbalized understanding of these instructions.       FOLLOW-UP Return in 4 weeks (on  07/12/2015) for Follow up INR at 0915h.  Jorene Guest, [name] Pharm.D., CACP

## 2015-06-14 NOTE — Patient Instructions (Signed)
Patient instructed to take medications as defined in the Anti-coagulation Track section of this encounter.  Patient instructed to take today's dose.  Patient verbalized understanding of these instructions.    

## 2015-06-14 NOTE — Progress Notes (Signed)
Mr. Sedor was referred to CSW as pt was denied Mobile Meals.  CSW met with pt to discuss available resources.  Mr. Hasegawa adult daughter lives with him and pt still drives.  Provided Mr. Dissinger information on [place], but only location that accepts food stamps is 30 miles away.  Pt states his main financial difficulty is affording his Ensure which he is to take 4 bottles/day.  [facility] had Ensure available in pantry and provided Mr. Poole with several Ensure coupons.  Pt denied additional social work needs at this time.  Pt is linked with [facility] and would be able to receive assistance with Ensure if established with [facility].

## 2015-06-14 NOTE — Assessment & Plan Note (Signed)
Patient was seen by Dr. Paulla Fore on 7/19. They discussed his avascular necrosis and the patient declined to have any surgery done. He reports that he does not believe he will do well with surgery given his right AKA and is concerned about the recovery time. He also notes that he has heard about a lot of complications from hip surgery and does not wish to have anything surgical done. Explained to him that this will not improve without surgery. He reports that he does not have much pain and when he does it is mostly in his left knee. Worse at night. Does not worsen with walking. He has opted to get hip injections if the pain becomes worse. Has follow up with Dr. Paulla Fore in 6 months or as needed. Counseled by Dr. Paulla Fore and myself today about smoking cessation.

## 2015-06-14 NOTE — Progress Notes (Signed)
Patient ID: [name], male   DOB: 04/29/1961, 54 y.o.   MRN: [id number]   Subjective:   Patient ID: [name] male   DOB: [birth year] 54 y.o.   MRN: [id number]  HPI: [name] is a 54 y.o. male with a PMH of HLD, avascular necrosis of bilateral femoral heads, CKD, Heptatits B here today requesting referral to ortho for hip injections.   For details of today's visit please see problem based charting.  Past Medical History  Diagnosis Date  . History of chicken pox   . Hyperlipidemia   . History of DVT of lower extremity     right  . Left hip pain 03/09/2013  . Preventative health care 03/09/2013   Current Outpatient Prescriptions  Medication Sig Dispense Refill  . aspirin 81 MG tablet Take 81 mg by mouth daily.      Marland Kitchen ENSURE (ENSURE) Take 1 Can by mouth 3 (three) times daily between meals. 237 mL prn  . EVOTAZ 300-150 MG per tablet TAKE 1 TABLET BY MOUTH DAILY. SWALLOW WHOLE. DO NOT CRUSH, CUT OR CHEW TABLET. TAKE WITH FOOD. 30 tablet 11  . fenofibrate (TRICOR) 145 MG tablet TAKE 1 TABLET BY MOUTH EVERY DAY 30 tablet 5  . gabapentin (NEURONTIN) 400 MG capsule TAKE ONE CAPSULE BY MOUTH FOUR TIMES DAILY 120 capsule 5  . morphine (MS CONTIN) 60 MG 12 hr tablet Take 2 tablets (120 mg total) by mouth every 12 (twelve) hours. 120 tablet 0  . oxyCODONE (OXY IR/ROXICODONE) 5 MG immediate release tablet Take 1 tablet (5 mg total) by mouth every 6 (six) hours as needed for severe pain. 60 tablet 0  . Potassium Chloride ER 20 MEQ TBCR Take 20 mEq by mouth 2 (two) times daily. 60 tablet 0  . potassium chloride SA (K-DUR,KLOR-CON) 20 MEQ tablet TAKE 1 TABLET BY MOUTH EVERY DAY 30 tablet 2  . [name] 50 MG tablet TAKE 1 TABLET (50 MG TOTAL) BY MOUTH DAILY. 30 tablet 11  . [name] 300 MG tablet TAKE 1 TABLET (300 MG TOTAL) BY MOUTH DAILY. 30 tablet 11  . warfarin (COUMADIN) 5 MG tablet Take as directed by anticoagulation clinic provider. 90 tablet 3   No current facility-administered  medications for this visit.   Family History  Problem Relation Age of Onset  . Arthritis Mother   . Kidney disease Maternal Grandfather    Social History   Social History  . Marital Status: Married    Spouse Name: N/A  . Number of Children: 3  . Years of Education: N/A   Occupational History  .     Social History Main Topics  . Smoking status: Current Every Day Smoker -- 0.50 packs/day for 36 years    Types: Cigarettes  . Smokeless tobacco: Never Used     Comment: ABOUT 1/2PPD.  Slowing down  . Alcohol Use: No  . Drug Use: No  . Sexual Activity: Not Currently     Comment: declined condoms   Other Topics Concern  . None   Social History Narrative   Review of Systems: Review of Systems  Constitutional: Negative for fever, chills and weight loss.  Respiratory: Negative for shortness of breath.   Cardiovascular: Negative for chest pain.  Gastrointestinal: Negative for nausea, vomiting, abdominal pain, diarrhea and constipation.  Genitourinary: Negative for dysuria.   Objective:  Physical Exam: Filed Vitals:   06/14/15 0955  BP: 136/71  Pulse: 97  Temp: 99.2 F (37.3 C)  TempSrc: Oral  Height:  6' (1.829 m)  Weight: 124 lb 12.8 oz (56.609 kg)  SpO2: 100%   Physical Exam Constitutional: He is oriented to person, place, and time. No distress. Cachectic  HENT:  Head: Normocephalic and atraumatic.  Mouth/Throat: Oropharynx is clear and moist. No oropharyngeal exudate.  Eyes: EOM are normal. Pupils are equal, round, and reactive to light.  Neck: Neck supple.  Cardiovascular: Normal rate, regular rhythm and normal heart sounds. Exam reveals no gallop and no friction rub.  No murmur heard. Pulmonary/Chest: Effort normal and breath sounds normal. No respiratory distress. He has no wheezes. He has no rales.  Abdominal: Soft. Bowel sounds are normal. He exhibits no distension and no mass. There is no tenderness. There is no rebound and no guarding.    Musculoskeletal: Normal range of motion. He exhibits no edema or tenderness.  Right AKA stump skin intact; no ulcers; B/L hips palpated and are non-tender to palpation No CVAT  Neurological: He is alert and oriented to person, place, and time. No cranial nerve deficit. Strength 5/5 in LLE.  Skin: Skin is warm. He is not diaphoretic.  Psychiatric: He has a normal mood and affect. His behavior is normal.  Vitals reviewed.  Assessment & Plan:   Case discussed with Dr. Daryll Drown. Please refer to Problem based carting for further details of today's visit.

## 2015-06-14 NOTE — Progress Notes (Signed)
I have reviewed Dr. Gladstone Pih note.  [name] is on anticoagulation for arterial embolism.  His INR was low normal. Coumadin increased correspondingly.

## 2015-06-16 ENCOUNTER — Other Ambulatory Visit: Payer: Self-pay

## 2015-06-16 DIAGNOSIS — G8929 Other chronic pain: Secondary | ICD-10-CM

## 2015-06-16 DIAGNOSIS — G546 Phantom limb syndrome with pain: Secondary | ICD-10-CM

## 2015-06-16 MED ORDER — MORPHINE SULFATE ER 60 MG PO TBCR
120.0000 mg | EXTENDED_RELEASE_TABLET | Freq: Two times a day (BID) | ORAL | Status: DC
Start: 2015-06-16 — End: 2015-07-16

## 2015-06-16 MED ORDER — OXYCODONE HCL 5 MG PO TABS: 5.0000 mg | ORAL_TABLET | Freq: Four times a day (QID) | ORAL | Status: DC | PRN

## 2015-06-16 NOTE — Telephone Encounter (Signed)
Calling for narcotic scripts.  Would like to pick up Friday.    Pt notified scrip is ready.   Laverle Patter, RN

## 2015-06-17 NOTE — Progress Notes (Signed)
Internal Medicine Clinic Attending  I saw and evaluated the patient.  I personally confirmed the key portions of the history and exam documented by Dr. Boswell and I reviewed pertinent patient test results.  The assessment, diagnosis, and plan were formulated together and I agree with the documentation in the resident's note. 

## 2015-07-01 ENCOUNTER — Other Ambulatory Visit: Payer: Self-pay | Admitting: Internal Medicine

## 2015-07-12 ENCOUNTER — Other Ambulatory Visit (INDEPENDENT_AMBULATORY_CARE_PROVIDER_SITE_OTHER): Payer: Medicare Other

## 2015-07-12 ENCOUNTER — Ambulatory Visit (INDEPENDENT_AMBULATORY_CARE_PROVIDER_SITE_OTHER): Payer: Medicare Other | Admitting: Pharmacist

## 2015-07-12 DIAGNOSIS — I749 Embolism and thrombosis of unspecified artery: Secondary | ICD-10-CM

## 2015-07-12 DIAGNOSIS — Z7901 Long term (current) use of anticoagulants: Secondary | ICD-10-CM

## 2015-07-12 LAB — PROTIME-INR
INR: 5.75 — AB (ref 0.00–1.49)
PROTHROMBIN TIME: 49.7 s — AB (ref 11.6–15.2)

## 2015-07-12 NOTE — Patient Instructions (Signed)
Patient instructed to take medications as defined in the Anti-coagulation Track section of this encounter.  Patient instructed to OMIT today's dose.  Patient verbalized understanding of these instructions.    

## 2015-07-12 NOTE — Progress Notes (Signed)
Anti-Coagulation Progress Note  [name] is a 54 y.o. male who is currently on an anti-coagulation regimen.    RECENT RESULTS: Recent results are below, the most recent result is correlated with a dose of 35 mg. per week: Lab Results  Component Value Date   INR 5.75* 07/12/2015   INR 2.00 06/14/2015   INR 2.20 05/24/2015   PROTIME 38.4* 04/01/2012    ANTI-COAG DOSE: Anticoagulation Dose Instructions as of 07/12/2015      [name] Tue Wed Thu Fri Sat   New Dose 5 mg Hold 2.5 mg 5 mg 2.5 mg 5 mg 2.5 mg    Description        Omit today's dose of warfarin. Recommence tomorrow with 1/2 tablet alternating with one whole tablet (warfarin 5mg  strength tablet) every other day.        ANTICOAG SUMMARY: Anticoagulation Episode Summary    Current INR goal 2.0-3.0  Next INR check 07/19/2015  INR from last check 5.75! 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  Patient instructed to OMIT today's dose.  Patient verbalized understanding of these instructions.       FOLLOW-UP Return in 7 days (on 07/19/2015) for Follow up INR at 0930h.  Jorene Guest, [name] Pharm.D., CACP

## 2015-07-16 ENCOUNTER — Other Ambulatory Visit: Payer: Self-pay | Admitting: *Deleted

## 2015-07-16 DIAGNOSIS — G8929 Other chronic pain: Secondary | ICD-10-CM

## 2015-07-16 DIAGNOSIS — G546 Phantom limb syndrome with pain: Secondary | ICD-10-CM

## 2015-07-16 MED ORDER — OXYCODONE HCL 5 MG PO TABS: 5.0000 mg | ORAL_TABLET | Freq: Four times a day (QID) | ORAL | Status: DC | PRN

## 2015-07-16 MED ORDER — MORPHINE SULFATE ER 60 MG PO TBCR: 120.0000 mg | EXTENDED_RELEASE_TABLET | Freq: Two times a day (BID) | ORAL | Status: DC

## 2015-07-19 ENCOUNTER — Ambulatory Visit (INDEPENDENT_AMBULATORY_CARE_PROVIDER_SITE_OTHER): Payer: Medicare Other | Admitting: Pharmacist

## 2015-07-19 DIAGNOSIS — Z7901 Long term (current) use of anticoagulants: Secondary | ICD-10-CM

## 2015-07-19 DIAGNOSIS — I749 Embolism and thrombosis of unspecified artery: Secondary | ICD-10-CM

## 2015-07-19 LAB — POCT INR: INR: 2.5

## 2015-07-19 NOTE — Progress Notes (Signed)
Anti-Coagulation Progress Note  [name] is a 54 y.o. male who is currently on an anti-coagulation regimen.    RECENT RESULTS: Recent results are below, the most recent result is correlated with a dose of 22.5 mg. 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.9] Long term current use of anticoagulant [Z79.01]        Comments       Anticoagulation Care Providers    Provider Role Specialty Phone number   Michel Bickers, MD  Infectious Diseases [phone]      ANTICOAG TODAY: Anticoagulation Summary as of 07/19/2015    INR goal 2.0-3.0  Selected INR 2.50 (07/19/2015)  Next INR check 08/16/2015  Target end date Indefinite   Indications  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  Patient instructed to take today's dose.  Patient verbalized understanding of these instructions.       FOLLOW-UP Return in 4  weeks (on 08/16/2015) for Follow up INR at 0900h.  Jorene Guest, [name] Pharm.D., CACP

## 2015-07-19 NOTE — Patient Instructions (Signed)
Patient instructed to take medications as defined in the Anti-coagulation Track section of this encounter.  Patient instructed to take today's dose.  Patient verbalized understanding of these instructions.    

## 2015-08-05 ENCOUNTER — Other Ambulatory Visit: Payer: Self-pay | Admitting: Internal Medicine

## 2015-08-06 ENCOUNTER — Other Ambulatory Visit: Payer: Self-pay | Admitting: Internal Medicine

## 2015-08-06 ENCOUNTER — Ambulatory Visit (INDEPENDENT_AMBULATORY_CARE_PROVIDER_SITE_OTHER): Payer: Medicare Other | Admitting: Internal Medicine

## 2015-08-06 ENCOUNTER — Encounter: Payer: Self-pay | Admitting: Internal Medicine

## 2015-08-06 VITALS — BP 132/67 | HR 91 | Temp 98.2°F | Ht 72.0 in | Wt 118.2 lb

## 2015-08-06 DIAGNOSIS — M87852 Other osteonecrosis, left femur: Secondary | ICD-10-CM | POA: Diagnosis not present

## 2015-08-06 DIAGNOSIS — R3589 Other polyuria: Secondary | ICD-10-CM | POA: Insufficient documentation

## 2015-08-06 DIAGNOSIS — M87851 Other osteonecrosis, right femur: Secondary | ICD-10-CM

## 2015-08-06 DIAGNOSIS — Z7901 Long term (current) use of anticoagulants: Secondary | ICD-10-CM

## 2015-08-06 DIAGNOSIS — R7303 Prediabetes: Secondary | ICD-10-CM | POA: Insufficient documentation

## 2015-08-06 DIAGNOSIS — R739 Hyperglycemia, unspecified: Secondary | ICD-10-CM

## 2015-08-06 DIAGNOSIS — F17210 Nicotine dependence, cigarettes, uncomplicated: Secondary | ICD-10-CM

## 2015-08-06 DIAGNOSIS — I82509 Chronic embolism and thrombosis of unspecified deep veins of unspecified lower extremity: Secondary | ICD-10-CM | POA: Diagnosis not present

## 2015-08-06 DIAGNOSIS — R319 Hematuria, unspecified: Secondary | ICD-10-CM

## 2015-08-06 DIAGNOSIS — M87059 Idiopathic aseptic necrosis of unspecified femur: Secondary | ICD-10-CM

## 2015-08-06 DIAGNOSIS — R358 Other polyuria: Secondary | ICD-10-CM | POA: Insufficient documentation

## 2015-08-06 LAB — POCT URINALYSIS DIPSTICK
BILIRUBIN UA: NEGATIVE
GLUCOSE UA: 500
Ketones, UA: NEGATIVE
LEUKOCYTES UA: NEGATIVE
NITRITE UA: NEGATIVE
Protein, UA: 100
Spec Grav, UA: 1.01
Urobilinogen, UA: 0.2
pH, UA: 6.5

## 2015-08-06 LAB — GLUCOSE, CAPILLARY: Glucose-Capillary: 98 mg/dL (ref 65–99)

## 2015-08-06 LAB — POCT GLYCOSYLATED HEMOGLOBIN (HGB A1C): Hemoglobin A1C: 5.5

## 2015-08-06 MED ORDER — FENOFIBRATE 145 MG PO TABS: 145.0000 mg | ORAL_TABLET | Freq: Every day | ORAL | Status: DC

## 2015-08-06 NOTE — Progress Notes (Signed)
Patient ID: [name], male   DOB: 1961-02-13, 54 y.o.   MRN: [id number]    Subjective:   Patient ID: [name] male   DOB: 05/30/1961 54 y.o.   MRN: [id number]  HPI: [name] is a 54 y.o. male with PMH as below, here for routine follow up of hip pain.  Please see Problem-Based charting for the status of the patient's chronic medical issues.   Past Medical History  Diagnosis Date  . History of chicken pox   . Hyperlipidemia   . History of DVT of lower extremity     right  . Left hip pain 03/09/2013  . Preventative health care 03/09/2013   Current Outpatient Prescriptions  Medication Sig Dispense Refill  . aspirin 81 MG tablet Take 81 mg by mouth daily.      Marland Kitchen ENSURE (ENSURE) Take 1 Can by mouth 3 (three) times daily between meals. 237 mL prn  . EVOTAZ 300-150 MG per tablet TAKE 1 TABLET BY MOUTH DAILY. SWALLOW WHOLE. DO NOT CRUSH, CUT OR CHEW TABLET. TAKE WITH FOOD. 30 tablet 11  . fenofibrate (TRICOR) 145 MG tablet Take 1 tablet (145 mg total) by mouth daily. 30 tablet 5  . gabapentin (NEURONTIN) 400 MG capsule TAKE ONE CAPSULE BY MOUTH FOUR TIMES DAILY 120 capsule 5  . morphine (MS CONTIN) 60 MG 12 hr tablet Take 2 tablets (120 mg total) by mouth every 12 (twelve) hours. 120 tablet 0  . oxyCODONE (OXY IR/ROXICODONE) 5 MG immediate release tablet Take 1 tablet (5 mg total) by mouth every 6 (six) hours as needed for severe pain. 60 tablet 0  . Potassium Chloride ER 20 MEQ TBCR Take 20 mEq by mouth 2 (two) times daily. 60 tablet 0  . potassium chloride SA (K-DUR,KLOR-CON) 20 MEQ tablet TAKE 1 TABLET BY MOUTH EVERY DAY 30 tablet 3  . [name] 50 MG tablet TAKE 1 TABLET (50 MG TOTAL) BY MOUTH DAILY. 30 tablet 11  . [name] 300 MG tablet TAKE 1 TABLET (300 MG TOTAL) BY MOUTH DAILY. 30 tablet 11  . warfarin (COUMADIN) 5 MG tablet TAKE AS DIRECTED BY ANTICOAGULATION CLINIC PROVIDER 270 tablet 0   No current facility-administered medications for this visit.   Family History    Problem Relation Age of Onset  . Arthritis Mother   . Kidney disease Maternal Grandfather    Social History   Social History  . Marital Status: Married    Spouse Name: N/A  . Number of Children: 3  . Years of Education: N/A   Occupational History  .     Social History Main Topics  . Smoking status: Current Every Day Smoker -- 0.50 packs/day for 36 years    Types: Cigarettes  . Smokeless tobacco: Never Used     Comment: ABOUT 1/2PPD.  Slowing down  . Alcohol Use: No  . Drug Use: No  . Sexual Activity: Not Currently     Comment: declined condoms   Other Topics Concern  . Not on file   Social History Narrative   Review of Systems: A comprehensive review of systems was negative. Objective:  Physical Exam: There were no vitals filed for this visit. Physical Exam  Constitutional: He is oriented to person, place, and time.  Thin male, sitting in wheel chair.  Appears older than stated age.  HENT:  Head: Normocephalic and atraumatic.  Eyes:  Sunglasses in place  Neck: No tracheal deviation present.  Cardiovascular: Normal rate, regular rhythm and normal heart sounds.  Pulmonary/Chest: Effort normal and breath sounds normal. He has no wheezes.  Abdominal: Soft. He exhibits no distension. There is no tenderness. There is no rebound and no guarding.  Musculoskeletal: He exhibits no edema.  Right AKA  Neurological: He is alert and oriented to person, place, and time.  Skin: Skin is warm and dry. No rash noted.     Assessment & Plan:   Patient and case were discussed with Dr. Evette Doffing.  Please refer to Problem Based charting for further documentation.

## 2015-08-06 NOTE — Progress Notes (Signed)
Internal Medicine Clinic Attending  I saw and evaluated the patient.  I personally confirmed the key portions of the history and exam documented by Dr. Lovena Le and I reviewed pertinent patient test results.  The assessment, diagnosis, and plan were formulated together and I agree with the documentation in the resident's note.  Unclear etiology for night time urine incontinence and prior UTI. He denies LUTS making BPH seem less likely. We would like to do a PVR but do not have this capability in our clinic. Urinalysis shows microscopic hematuria without signs of infection, which will need evaluation for bladder cancer given smoking history. For incontinence now we advised timed voiding and avoiding oral liquid intake in the evening. Refer to urology for cystoscopy and CT as well as PVR.

## 2015-08-06 NOTE — Assessment & Plan Note (Addendum)
Patient complaining of "couple weeks" of waking up at night having urinated in the bed.  He says he does not wake because he needs to pee and cannot get to the bathroom.  It is more that he wakes up wet. He reports polyuria, polydypsia, and thirst. He reports a 9 lb weight loss over the last month.  He denies dysuria.  He denies hesitation when he pees during the day.  He denies a h/o diabetes or prostate problems.  He used to drink a 2L of Pepsi a day, but now mostly drinks water.   He reports he was diagnosed with UTI in July.  He is not sure why he got the UTI.  He did not have dysuria at that time either.  A/P: BPH vs Bladder cancer.  Patient has a h/o drinking 2L Pepsi per day.  He denies dysuria currently, though he did not have dysuria with his UTI in April.  No h/o BPH or hesitancy when attempting to pee.  A1c: 5.5.  U/A: negative for Leukocytes or Nitrites. Large Blood. Patient has 40 pack year history.  Bladder cancer could certainly be possible cause of his nighttime incontinence. - Referral to Urology for evaluation of hematuria  - Lifestyle modifications: urinate every 3 hours, do not drink fluids after dinner. - Consider Tamsulosin in the future if symptoms persist despite lifestyle changes.

## 2015-08-06 NOTE — Assessment & Plan Note (Signed)
He presents complaining of nighttime incontinence (See Hyperglycemia Problem-based charting).  He denies symptoms of urinary hesitancy or history of BPH.  U/A demonstrates Large Blood.  He has a 40 pack year smoking history.  A/P: Concern for bladder cancer given patient's smoking status.  He is also on chronic warfarin therapy for unprovoked DVT, which may increase the amount of blood, but should not be the cause of the bleeding. - Urology consult.

## 2015-08-06 NOTE — Patient Instructions (Addendum)
1. Make Urology appointment for evaluation of blood in urine. 2. Follow up with Dr. Paulla Fore for your hip injections.  You do not need a referral.  You can call their office for an appointment.  Hematuria, Adult Hematuria is blood in your urine. It can be caused by a bladder infection, kidney infection, prostate infection, kidney stone, or cancer of your urinary tract. Infections can usually be treated with medicine, and a kidney stone usually will pass through your urine. If neither of these is the cause of your hematuria, further workup to find out the reason may be needed. It is very important that you tell your health care provider about any blood you see in your urine, even if the blood stops without treatment or happens without causing pain. Blood in your urine that happens and then stops and then happens again can be a symptom of a very serious condition. Also, pain is not a symptom in the initial stages of many urinary cancers. HOME CARE INSTRUCTIONS   Drink lots of fluid, 3-4 quarts a day. If you have been diagnosed with an infection, cranberry juice is especially recommended, in addition to large amounts of water.  Avoid caffeine, tea, and carbonated beverages because they tend to irritate the bladder.  Avoid alcohol because it may irritate the prostate.  Take all medicines as directed by your health care provider.  If you were prescribed an antibiotic medicine, finish it all even if you start to feel better.  If you have been diagnosed with a kidney stone, follow your health care provider's instructions regarding straining your urine to catch the stone.  Empty your bladder often. Avoid holding urine for long periods of time.  After a bowel movement, women should cleanse front to back. Use each tissue only once.  Empty your bladder before and after sexual intercourse if you are a male. SEEK MEDICAL CARE IF:  You develop back pain.  You have a fever.  You have a feeling of  sickness in your stomach (nausea) or vomiting.  Your symptoms are not better in 3 days. Return sooner if you are getting worse. SEEK IMMEDIATE MEDICAL CARE IF:   You develop severe vomiting and are unable to keep the medicine down.  You develop severe back or abdominal pain despite taking your medicines.  You begin passing a large amount of blood or clots in your urine.  You feel extremely weak or faint, or you pass out. MAKE SURE YOU:   Understand these instructions.  Will watch your condition.  Will get help right away if you are not doing well or get worse.   This information is not intended to replace advice given to you by your health care provider. Make sure you discuss any questions you have with your health care provider.   Document Released: 10/16/2005 Document Revised: 11/06/2014 Document Reviewed: 06/16/2013 Elsevier Interactive Patient Education 2016 Elsevier [employer]    Avascular Necrosis Avascular necrosis is a disease resulting from the temporary or permanent loss of blood supply to a bone. This disease may also be known as:   Osteonecrosis.   Aseptic necrosis.   Ischemic bone necrosis. Without proper blood supply, the internal layer of the affected bone dies and the outer layer of the bone may break down. If this process affects a bone near a joint, it may lead to collapse of that joint. Common bones that are affected by this condition include:  The top of your thigh bone (femoral head).  One or  more bones in your wrist (scaphoid orlunate).  One or more bones in your foot (metatarsals).  One of the bones in your ankle (navicular). The joint most commonly affected by this condition is the hip joint. Avascular necrosis rarely occurs in more than one bone at a time.  CAUSES   Damage or injury to a bone or joint.  Using corticosteroid medicine for a long period of time.  Changes in your immune or hormone systems.  Excessive exposure to  radiation. RISK FACTORS  Alcohol abuse.  Previous traumatic injury to a joint.  Using corticosteroid medicines for a long period of time or often.  Having a medical condition such as:  HIV or AIDS.   Diabetes.   Sickle cell disease.  An autoimmune disease. SIGNS AND SYMPTOMS  The main symptoms of avascular necrosis are pain and decreased motion in the affected bone or joint. In the early stages the pain may be minor and occur only with activity. As avascular necrosis progresses, pain may gradually worsen and occur while at rest. The pain may suddenly become severe if an affected joint collapses. DIAGNOSIS  Avascular necrosis may be diagnosed with:   A medical history.  A physical exam.   X-rays.  An MRI.  A bone scan. TREATMENT  Treatments may include:  Medicine to help relieve pain.  Avoiding placing any pressure or weight ontheaffected area. If avascular necrosis occurs in your hip, ankle, or foot, you may be instructed to use crutches or a rolling scooter.  Surgery, such as:   Core decompression. In this surgery, one or more holes are placed in the bone for new blood vessels to grow into. This provides a renewed blood supply to the bone. Core decompression can often reduce pain and pressure in the affected bone and slow the progression of bone and joint destruction.  Osteotomy. In this surgery, the bone is reshaped to reduce stress on the affected area of the joint.   Bone grafting. In this surgery, healthy bone from one part of your body is transplanted to the affected area.   Arthroplasty. Arthroplasty is also known as total joint replacement. In this surgery, the affected surface on one or both sides of a joint is replaced with artificial parts (prostheses).  Electrical stimulation. This may help encourage new bone growth. HOME CARE INSTRUCTIONS  Take medicines only as directed by your health care provider.   Follow your health care provider's  recommendations on limiting activities or using crutches to rest your affected joint.   Meet with aphysical therapist as directed by your health care provider.   Keep all follow-up visits as directed by your health care provider. This is important. SEEK MEDICAL CARE IF:   Your pain worsens.  You have decreased motion in your affected joint. SEEK IMMEDIATE MEDICAL CARE IF:  Your pain suddenly becomes severe.   This information is not intended to replace advice given to you by your health care provider. Make sure you discuss any questions you have with your health care provider.   Document Released: 04/07/2002 Document Revised: 11/06/2014 Document Reviewed: 12/24/2013 Elsevier Interactive Patient Education [employer].

## 2015-08-06 NOTE — Assessment & Plan Note (Signed)
Patient continues to complain of left hip pain.  He saw Dr. Paulla Fore in August, at which time he decided he did not want hip replacement.  He wants another referral to Dr. Paulla Fore in order to proceed with hip injections.  He has an appointment with Dr. Paulla Fore on November 22, 2015.  He is currently taking his chronic opioids for pain control.  A/P: AVN of b/l hips.  Patient thought he needed another referral in order to see Dr. Paulla Fore for the injections.  He was informed that he does not need a referral as he is an established patient.   - Follow up with Dr. Paulla Fore - Continue opioids as prescribed.

## 2015-08-09 ENCOUNTER — Other Ambulatory Visit: Payer: Self-pay | Admitting: *Deleted

## 2015-08-09 DIAGNOSIS — R634 Abnormal weight loss: Secondary | ICD-10-CM

## 2015-08-09 MED ORDER — ENSURE PO LIQD: 1.0000 | Freq: Three times a day (TID) | ORAL | Status: DC

## 2015-08-16 ENCOUNTER — Ambulatory Visit (INDEPENDENT_AMBULATORY_CARE_PROVIDER_SITE_OTHER): Payer: Medicare Other | Admitting: Pharmacist

## 2015-08-16 DIAGNOSIS — Z7901 Long term (current) use of anticoagulants: Secondary | ICD-10-CM | POA: Diagnosis present

## 2015-08-16 DIAGNOSIS — I749 Embolism and thrombosis of unspecified artery: Secondary | ICD-10-CM | POA: Diagnosis not present

## 2015-08-16 LAB — POCT INR: INR: 6.6

## 2015-08-16 NOTE — Progress Notes (Signed)
Anti-Coagulation Progress Note  [name] is a 54 y.o. male who is currently on an anti-coagulation regimen.    RECENT RESULTS: Recent results are below, the most recent result is correlated with a dose of 27.5 mg. per week: Lab Results  Component Value Date   INR 6.60 08/16/2015   INR 2.50 07/19/2015   INR 5.75* 07/12/2015   PROTIME 38.4* 04/01/2012    ANTI-COAG DOSE: Anticoagulation Dose Instructions as of 08/16/2015      [name] Tue Wed Thu Fri Sat   New Dose 5 mg Hold Hold 5 mg 2.5 mg 5 mg 2.5 mg       ANTICOAG SUMMARY: Anticoagulation Episode Summary    Current INR goal 2.0-3.0  Next INR check 08/18/2015  INR from last check 6.60! 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  Patient instructed to OMIT  today's dose; OMIT tomorrow's dose. Return to clinic on Wednesday 19-OCT-16 at 1100h for  repeat INR.  Patient verbalized understanding of these instructions.       FOLLOW-UP Return in 2 days (on 08/18/2015) for Follow up INR at 1100h. Jorene Guest, [name] Pharm.D., CACP

## 2015-08-16 NOTE — Patient Instructions (Signed)
Patient instructed to take medications as defined in the Anti-coagulation Track section of this encounter.  Patient instructed to OMIT  today's dose; OMIT tomorrow's dose. Return to clinic on Wednesday 19-OCT-16 at 1100h for repeat INR.  Patient verbalized understanding of these instructions.

## 2015-08-16 NOTE — Progress Notes (Signed)
INTERNAL MEDICINE TEACHING ATTENDING ADDENDUM - Amyri Frenz M.D  °Duration- indefinite, Indication- DVT, INR- supratherapeutic. Agree with pharmacy recommendations as outlined in their note.  ° ° ° °

## 2015-08-18 ENCOUNTER — Other Ambulatory Visit: Payer: Self-pay | Admitting: *Deleted

## 2015-08-18 ENCOUNTER — Ambulatory Visit (INDEPENDENT_AMBULATORY_CARE_PROVIDER_SITE_OTHER): Payer: Medicare Other | Admitting: Pharmacist

## 2015-08-18 DIAGNOSIS — Z7901 Long term (current) use of anticoagulants: Secondary | ICD-10-CM

## 2015-08-18 DIAGNOSIS — I749 Embolism and thrombosis of unspecified artery: Secondary | ICD-10-CM | POA: Diagnosis not present

## 2015-08-18 DIAGNOSIS — G546 Phantom limb syndrome with pain: Secondary | ICD-10-CM

## 2015-08-18 DIAGNOSIS — G8929 Other chronic pain: Secondary | ICD-10-CM

## 2015-08-18 LAB — POCT INR: INR: 4

## 2015-08-18 MED ORDER — OXYCODONE HCL 5 MG PO TABS: 5.0000 mg | ORAL_TABLET | Freq: Four times a day (QID) | ORAL | Status: DC | PRN

## 2015-08-18 MED ORDER — MORPHINE SULFATE ER 60 MG PO TBCR
120.0000 mg | EXTENDED_RELEASE_TABLET | Freq: Two times a day (BID) | ORAL | Status: DC
Start: 2015-08-18 — End: 2015-09-29

## 2015-08-18 NOTE — Patient Instructions (Signed)
Patient educated about medication as defined in this encounter and verbalized understanding by repeating back instructions provided.   

## 2015-08-18 NOTE — Progress Notes (Signed)
Anticoagulation Management [name] is a 54 y.o. male who reports to the clinic for monitoring of warfarin treatment.    Indication: VTE history Duration: indefinite  Anticoagulation Clinic Visit History: Anticoagulation Episode Summary    Current INR goal 2.0-3.0  Next INR check 08/23/2015  INR from last check 4.0! 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] Long term current use of anticoagulant [Z79.01]        Comments       Anticoagulation Care Providers    Berlene Dixson Role Specialty Phone number   Michel Bickers, MD  Infectious Diseases [phone]     ASSESSMENT Recent Results: Recent results are below, the most recent result is correlated with a dose of 20 mg per week: Lab Results  Component Value Date   INR 4.0 08/18/2015   INR 6.60 08/16/2015   INR 2.50 07/19/2015   PROTIME 38.4* 04/01/2012   INR today: Supratherapeutic  Anticoagulation Dosing: INR as of 08/18/2015 and Previous Dosing Information    INR Dt INR Goal [employer] Sun Mon Tue Wed Thu Fri Sat   08/18/2015 4.0 2.0-3.0 20 mg 5 mg 0 mg 0 mg 5 mg 2.5 mg 5 mg 2.5 mg    Anticoagulation Dose Instructions as of 08/18/2015      Total Sun Mon Tue Wed Thu Fri Sat   New Dose 15 mg 5 mg Hold Hold Hold 2.5 mg 2.5 mg 5 mg     (5 mg x 1)  -  -  -  (5 mg x 0.5)  (5 mg x 0.5)  (5 mg x 1)                           PLAN Weekly dose was decreased to 15 mg, as we are re-establishing a therapeutic weekly dose for patient considering recent INR fluctuations.  Patient Instructions  Patient educated about medication as defined in this encounter and verbalized understanding by repeating back instructions provided.     Follow-up Return in about 5 days (around 08/23/2015) for Follow up INR on 08/23/15 at 11am.  Kim,Jennifer J  15 minutes  spent face-to-face with the patient during the encounter. 50% of time spent on education. 50% of time was spent on assessment and plan.

## 2015-08-20 NOTE — Progress Notes (Signed)
INTERNAL MEDICINE TEACHING ATTENDING ADDENDUM - Aldine Contes M.D  Duration- indefinite, Indication- arterial thrombosis, INR- supratherapeutic. Agree with pharmacy recommendations as outlined in their note.

## 2015-08-23 ENCOUNTER — Other Ambulatory Visit: Payer: Medicare Other

## 2015-08-23 ENCOUNTER — Ambulatory Visit (INDEPENDENT_AMBULATORY_CARE_PROVIDER_SITE_OTHER): Payer: Medicare Other | Admitting: Pharmacist

## 2015-08-23 DIAGNOSIS — Z7901 Long term (current) use of anticoagulants: Secondary | ICD-10-CM

## 2015-08-23 DIAGNOSIS — I749 Embolism and thrombosis of unspecified artery: Secondary | ICD-10-CM | POA: Diagnosis not present

## 2015-08-23 DIAGNOSIS — B20 Human immunodeficiency virus [HIV] disease: Secondary | ICD-10-CM | POA: Diagnosis not present

## 2015-08-23 LAB — POCT INR: INR: 2.4

## 2015-08-23 NOTE — Progress Notes (Signed)
Anti-Coagulation Progress Note  [name] is a 54 y.o. male who is currently on an anti-coagulation regimen.    RECENT RESULTS: Recent results are below, the most recent result is correlated with a dose of 15  mg.  Over past 4 days--weighted daily dose 3.75mg /day.   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] Long term current use of anticoagulant [Z79.01]        Comments       Anticoagulation Care Providers    Provider Role Specialty Phone number   Michel Bickers, MD  Infectious Diseases [phone]      ANTICOAG TODAY: Anticoagulation Summary as of 08/23/2015    INR goal 2.0-3.0  Selected INR 2.40 (08/23/2015)  Next INR check 09/06/2015  Target end date Indefinite   Indications  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  Patient instructed to take today's dose.  Patient verbalized understanding of  these instructions.       FOLLOW-UP Return in 2 weeks (on 09/06/2015) for Follow up INR at 1100h.  Jorene Guest, [name] Pharm.D., CACP

## 2015-08-23 NOTE — Patient Instructions (Signed)
Patient instructed to take medications as defined in the Anti-coagulation Track section of this encounter.  Patient instructed to take today's dose.  Patient verbalized understanding of these instructions.    

## 2015-08-24 LAB — T-HELPER CELL (CD4) - (RCID CLINIC ONLY)
CD4 T CELL ABS: 700 /uL (ref 400–2700)
CD4 T CELL HELPER: 21 % — AB (ref 33–55)

## 2015-08-25 DIAGNOSIS — N3944 Nocturnal enuresis: Secondary | ICD-10-CM | POA: Diagnosis not present

## 2015-08-25 DIAGNOSIS — N3941 Urge incontinence: Secondary | ICD-10-CM | POA: Diagnosis not present

## 2015-08-25 DIAGNOSIS — R310 Gross hematuria: Secondary | ICD-10-CM | POA: Diagnosis not present

## 2015-08-25 DIAGNOSIS — R3915 Urgency of urination: Secondary | ICD-10-CM | POA: Diagnosis not present

## 2015-08-25 LAB — HIV-1 RNA QUANT-NO REFLEX-BLD: HIV 1 RNA Quant: <20 copies/mL (ref <20)

## 2015-08-30 NOTE — Progress Notes (Signed)
Indication: History of deep venous thrombosis Duration: Chronic per patient's informed choice INR: At target. Dr. Gladstone Pih assessment and plan were reviewed and I agree with his documentation.

## 2015-08-31 DIAGNOSIS — E46 Unspecified protein-calorie malnutrition: Secondary | ICD-10-CM

## 2015-08-31 HISTORY — DX: Unspecified protein-calorie malnutrition: E46

## 2015-09-06 DIAGNOSIS — Z681 Body mass index (BMI) 19 or less, adult: Secondary | ICD-10-CM | POA: Diagnosis not present

## 2015-09-06 DIAGNOSIS — D6832 Hemorrhagic disorder due to extrinsic circulating anticoagulants: Secondary | ICD-10-CM | POA: Diagnosis present

## 2015-09-06 DIAGNOSIS — Z452 Encounter for adjustment and management of vascular access device: Secondary | ICD-10-CM | POA: Diagnosis not present

## 2015-09-06 DIAGNOSIS — I361 Nonrheumatic tricuspid (valve) insufficiency: Secondary | ICD-10-CM | POA: Diagnosis not present

## 2015-09-06 DIAGNOSIS — E43 Unspecified severe protein-calorie malnutrition: Secondary | ICD-10-CM | POA: Diagnosis present

## 2015-09-06 DIAGNOSIS — R7881 Bacteremia: Secondary | ICD-10-CM | POA: Diagnosis not present

## 2015-09-06 DIAGNOSIS — A419 Sepsis, unspecified organism: Secondary | ICD-10-CM | POA: Diagnosis not present

## 2015-09-06 DIAGNOSIS — D689 Coagulation defect, unspecified: Secondary | ICD-10-CM | POA: Diagnosis not present

## 2015-09-06 DIAGNOSIS — F17200 Nicotine dependence, unspecified, uncomplicated: Secondary | ICD-10-CM | POA: Diagnosis not present

## 2015-09-06 DIAGNOSIS — B20 Human immunodeficiency virus [HIV] disease: Secondary | ICD-10-CM | POA: Diagnosis not present

## 2015-09-06 DIAGNOSIS — Z21 Asymptomatic human immunodeficiency virus [HIV] infection status: Secondary | ICD-10-CM | POA: Diagnosis not present

## 2015-09-06 DIAGNOSIS — E872 Acidosis: Secondary | ICD-10-CM | POA: Diagnosis not present

## 2015-09-06 DIAGNOSIS — R4182 Altered mental status, unspecified: Secondary | ICD-10-CM | POA: Diagnosis not present

## 2015-09-06 DIAGNOSIS — N170 Acute kidney failure with tubular necrosis: Secondary | ICD-10-CM | POA: Diagnosis not present

## 2015-09-06 DIAGNOSIS — E869 Volume depletion, unspecified: Secondary | ICD-10-CM | POA: Diagnosis not present

## 2015-09-06 DIAGNOSIS — R6521 Severe sepsis with septic shock: Secondary | ICD-10-CM | POA: Diagnosis not present

## 2015-09-06 DIAGNOSIS — A4150 Gram-negative sepsis, unspecified: Secondary | ICD-10-CM | POA: Diagnosis not present

## 2015-09-06 DIAGNOSIS — Z7901 Long term (current) use of anticoagulants: Secondary | ICD-10-CM | POA: Diagnosis not present

## 2015-09-06 DIAGNOSIS — I959 Hypotension, unspecified: Secondary | ICD-10-CM | POA: Diagnosis not present

## 2015-09-06 DIAGNOSIS — R410 Disorientation, unspecified: Secondary | ICD-10-CM | POA: Diagnosis not present

## 2015-09-06 DIAGNOSIS — E861 Hypovolemia: Secondary | ICD-10-CM | POA: Diagnosis not present

## 2015-09-06 DIAGNOSIS — N2889 Other specified disorders of kidney and ureter: Secondary | ICD-10-CM | POA: Diagnosis not present

## 2015-09-06 DIAGNOSIS — A4159 Other Gram-negative sepsis: Secondary | ICD-10-CM | POA: Diagnosis not present

## 2015-09-06 DIAGNOSIS — R188 Other ascites: Secondary | ICD-10-CM | POA: Diagnosis not present

## 2015-09-06 DIAGNOSIS — Z7982 Long term (current) use of aspirin: Secondary | ICD-10-CM | POA: Diagnosis not present

## 2015-09-06 DIAGNOSIS — T50901D Poisoning by unspecified drugs, medicaments and biological substances, accidental (unintentional), subsequent encounter: Secondary | ICD-10-CM | POA: Diagnosis not present

## 2015-09-06 DIAGNOSIS — K920 Hematemesis: Secondary | ICD-10-CM | POA: Diagnosis not present

## 2015-09-06 DIAGNOSIS — G9341 Metabolic encephalopathy: Secondary | ICD-10-CM | POA: Diagnosis present

## 2015-09-06 DIAGNOSIS — B961 Klebsiella pneumoniae [K. pneumoniae] as the cause of diseases classified elsewhere: Secondary | ICD-10-CM | POA: Diagnosis present

## 2015-09-06 DIAGNOSIS — D649 Anemia, unspecified: Secondary | ICD-10-CM | POA: Diagnosis not present

## 2015-09-06 DIAGNOSIS — T45511A Poisoning by anticoagulants, accidental (unintentional), initial encounter: Secondary | ICD-10-CM | POA: Diagnosis not present

## 2015-09-06 DIAGNOSIS — D6859 Other primary thrombophilia: Secondary | ICD-10-CM | POA: Diagnosis not present

## 2015-09-06 DIAGNOSIS — R7989 Other specified abnormal findings of blood chemistry: Secondary | ICD-10-CM | POA: Diagnosis not present

## 2015-09-06 DIAGNOSIS — N1330 Unspecified hydronephrosis: Secondary | ICD-10-CM | POA: Diagnosis not present

## 2015-09-06 DIAGNOSIS — K922 Gastrointestinal hemorrhage, unspecified: Secondary | ICD-10-CM | POA: Diagnosis not present

## 2015-09-06 DIAGNOSIS — J690 Pneumonitis due to inhalation of food and vomit: Secondary | ICD-10-CM | POA: Diagnosis not present

## 2015-09-06 DIAGNOSIS — E871 Hypo-osmolality and hyponatremia: Secondary | ICD-10-CM | POA: Diagnosis not present

## 2015-09-06 DIAGNOSIS — R1110 Vomiting, unspecified: Secondary | ICD-10-CM | POA: Diagnosis not present

## 2015-09-06 DIAGNOSIS — J181 Lobar pneumonia, unspecified organism: Secondary | ICD-10-CM | POA: Diagnosis not present

## 2015-09-06 DIAGNOSIS — R748 Abnormal levels of other serum enzymes: Secondary | ICD-10-CM | POA: Diagnosis not present

## 2015-09-06 DIAGNOSIS — N19 Unspecified kidney failure: Secondary | ICD-10-CM | POA: Diagnosis not present

## 2015-09-06 DIAGNOSIS — R6520 Severe sepsis without septic shock: Secondary | ICD-10-CM | POA: Diagnosis not present

## 2015-09-06 DIAGNOSIS — J9600 Acute respiratory failure, unspecified whether with hypoxia or hypercapnia: Secondary | ICD-10-CM | POA: Diagnosis not present

## 2015-09-06 DIAGNOSIS — E876 Hypokalemia: Secondary | ICD-10-CM | POA: Diagnosis not present

## 2015-09-06 DIAGNOSIS — J189 Pneumonia, unspecified organism: Secondary | ICD-10-CM | POA: Diagnosis not present

## 2015-09-06 DIAGNOSIS — M879 Osteonecrosis, unspecified: Secondary | ICD-10-CM | POA: Diagnosis not present

## 2015-09-06 DIAGNOSIS — E870 Hyperosmolality and hypernatremia: Secondary | ICD-10-CM | POA: Diagnosis not present

## 2015-09-06 DIAGNOSIS — N179 Acute kidney failure, unspecified: Secondary | ICD-10-CM | POA: Diagnosis not present

## 2015-09-06 DIAGNOSIS — K5660 Unspecified intestinal obstruction: Secondary | ICD-10-CM | POA: Diagnosis present

## 2015-09-06 DIAGNOSIS — Z9911 Dependence on respirator [ventilator] status: Secondary | ICD-10-CM | POA: Diagnosis not present

## 2015-09-06 DIAGNOSIS — J9601 Acute respiratory failure with hypoxia: Secondary | ICD-10-CM | POA: Diagnosis not present

## 2015-09-06 DIAGNOSIS — K2970 Gastritis, unspecified, without bleeding: Secondary | ICD-10-CM | POA: Diagnosis not present

## 2015-09-06 DIAGNOSIS — F17210 Nicotine dependence, cigarettes, uncomplicated: Secondary | ICD-10-CM | POA: Diagnosis present

## 2015-09-06 DIAGNOSIS — J8489 Other specified interstitial pulmonary diseases: Secondary | ICD-10-CM | POA: Diagnosis not present

## 2015-09-06 DIAGNOSIS — R109 Unspecified abdominal pain: Secondary | ICD-10-CM | POA: Diagnosis not present

## 2015-09-07 ENCOUNTER — Ambulatory Visit (INDEPENDENT_AMBULATORY_CARE_PROVIDER_SITE_OTHER): Payer: Medicare Other | Admitting: Internal Medicine

## 2015-09-09 NOTE — Progress Notes (Signed)
Mr.Dehaven Septer is a 54 y.o. M w/ PMH HIV (CD4 1210, VL <20 on 03/12/15), Hep B, EtOH abuse, tobacco abuse, h/o DVT on Coumadin, and CKD who presented to [facility] on 11/7 with AKI (cr of 7), INR>10, sepsis secondary to klebsiella , source likely gallbladder, respiratory failure requiring intubation, extubated  11/10, also came of levophed at 9:00 am on 11/10 , found to have hematemesis and SBO  And has an NGT , seen by ID and rx with meropenem and fluconazole, GI saw the patient and did not want to do EGD as no evidence of portal gastropathy on imaging per Dr Vara Guardian , and  his hg has been stable around 8, cr has improved from 7>2.6,INR now 3,  last cxr yesterday showed b/l PNA , last ABG 7.37,32,113,now on [place] on RA  USG of gallbladder showed GB wall thickening , HIDA scan to be done today   Pt started requesting tx to [facility] when he woke up , received a call from Dr Heloise Beecham care at [facility] , pt needs to stabilize and be monitored for atleast 24 hrs prior to tx to [facility] stepdown bed ,  Patient would need CCS and Gi eval upon arrival  Also patient belongs to teaching service and would need to tx under there care if pt and Dr Vara Guardian still desire to do the tx tomorrow   Teaching service can call me if any questions [phone]

## 2015-09-10 ENCOUNTER — Inpatient Hospital Stay (HOSPITAL_COMMUNITY)
Admission: AD | Admit: 2015-09-10 | Discharge: 2015-09-22 | DRG: 974 | Disposition: A | Discharge status: Rehabilitation Facility | Payer: Medicare Other | Source: Other Acute Inpatient Hospital | Attending: Oncology | Admitting: Oncology

## 2015-09-10 ENCOUNTER — Inpatient Hospital Stay: Admit: 2015-09-10 | Payer: Self-pay | Admitting: Internal Medicine

## 2015-09-10 ENCOUNTER — Inpatient Hospital Stay (HOSPITAL_COMMUNITY): Payer: Medicare Other

## 2015-09-10 DIAGNOSIS — F1010 Alcohol abuse, uncomplicated: Secondary | ICD-10-CM | POA: Diagnosis present

## 2015-09-10 DIAGNOSIS — K920 Hematemesis: Secondary | ICD-10-CM | POA: Diagnosis present

## 2015-09-10 DIAGNOSIS — I4891 Unspecified atrial fibrillation: Secondary | ICD-10-CM | POA: Diagnosis present

## 2015-09-10 DIAGNOSIS — N179 Acute kidney failure, unspecified: Secondary | ICD-10-CM | POA: Diagnosis not present

## 2015-09-10 DIAGNOSIS — J690 Pneumonitis due to inhalation of food and vomit: Secondary | ICD-10-CM | POA: Diagnosis not present

## 2015-09-10 DIAGNOSIS — L89152 Pressure ulcer of sacral region, stage 2: Secondary | ICD-10-CM | POA: Diagnosis present

## 2015-09-10 DIAGNOSIS — N182 Chronic kidney disease, stage 2 (mild): Secondary | ICD-10-CM | POA: Diagnosis present

## 2015-09-10 DIAGNOSIS — R05 Cough: Secondary | ICD-10-CM | POA: Diagnosis not present

## 2015-09-10 DIAGNOSIS — R6521 Severe sepsis with septic shock: Secondary | ICD-10-CM | POA: Diagnosis present

## 2015-09-10 DIAGNOSIS — E872 Acidosis: Secondary | ICD-10-CM | POA: Diagnosis present

## 2015-09-10 DIAGNOSIS — M879 Osteonecrosis, unspecified: Secondary | ICD-10-CM | POA: Diagnosis present

## 2015-09-10 DIAGNOSIS — G546 Phantom limb syndrome with pain: Secondary | ICD-10-CM | POA: Diagnosis present

## 2015-09-10 DIAGNOSIS — R7881 Bacteremia: Secondary | ICD-10-CM | POA: Diagnosis not present

## 2015-09-10 DIAGNOSIS — R109 Unspecified abdominal pain: Secondary | ICD-10-CM | POA: Diagnosis not present

## 2015-09-10 DIAGNOSIS — B961 Klebsiella pneumoniae [K. pneumoniae] as the cause of diseases classified elsewhere: Secondary | ICD-10-CM | POA: Diagnosis present

## 2015-09-10 DIAGNOSIS — N170 Acute kidney failure with tubular necrosis: Secondary | ICD-10-CM | POA: Diagnosis not present

## 2015-09-10 DIAGNOSIS — Z89612 Acquired absence of left leg above knee: Secondary | ICD-10-CM | POA: Diagnosis not present

## 2015-09-10 DIAGNOSIS — K567 Ileus, unspecified: Secondary | ICD-10-CM | POA: Diagnosis present

## 2015-09-10 DIAGNOSIS — B9689 Other specified bacterial agents as the cause of diseases classified elsewhere: Secondary | ICD-10-CM | POA: Diagnosis present

## 2015-09-10 DIAGNOSIS — F1011 Alcohol abuse, in remission: Secondary | ICD-10-CM | POA: Diagnosis present

## 2015-09-10 DIAGNOSIS — K922 Gastrointestinal hemorrhage, unspecified: Secondary | ICD-10-CM | POA: Diagnosis not present

## 2015-09-10 DIAGNOSIS — N2589 Other disorders resulting from impaired renal tubular function: Secondary | ICD-10-CM | POA: Insufficient documentation

## 2015-09-10 DIAGNOSIS — K760 Fatty (change of) liver, not elsewhere classified: Secondary | ICD-10-CM | POA: Diagnosis present

## 2015-09-10 DIAGNOSIS — B20 Human immunodeficiency virus [HIV] disease: Secondary | ICD-10-CM | POA: Diagnosis not present

## 2015-09-10 DIAGNOSIS — L8992 Pressure ulcer of unspecified site, stage 2: Secondary | ICD-10-CM | POA: Diagnosis present

## 2015-09-10 DIAGNOSIS — R7989 Other specified abnormal findings of blood chemistry: Secondary | ICD-10-CM | POA: Diagnosis not present

## 2015-09-10 DIAGNOSIS — E876 Hypokalemia: Secondary | ICD-10-CM | POA: Diagnosis present

## 2015-09-10 DIAGNOSIS — Z89619 Acquired absence of unspecified leg above knee: Secondary | ICD-10-CM

## 2015-09-10 DIAGNOSIS — T45511A Poisoning by anticoagulants, accidental (unintentional), initial encounter: Secondary | ICD-10-CM | POA: Diagnosis not present

## 2015-09-10 DIAGNOSIS — J150 Pneumonia due to Klebsiella pneumoniae: Secondary | ICD-10-CM | POA: Diagnosis not present

## 2015-09-10 DIAGNOSIS — Z888 Allergy status to other drugs, medicaments and biological substances status: Secondary | ICD-10-CM | POA: Diagnosis not present

## 2015-09-10 DIAGNOSIS — G9340 Encephalopathy, unspecified: Secondary | ICD-10-CM | POA: Diagnosis present

## 2015-09-10 DIAGNOSIS — R6520 Severe sepsis without septic shock: Secondary | ICD-10-CM | POA: Diagnosis not present

## 2015-09-10 DIAGNOSIS — F1020 Alcohol dependence, uncomplicated: Secondary | ICD-10-CM | POA: Diagnosis present

## 2015-09-10 DIAGNOSIS — M87059 Idiopathic aseptic necrosis of unspecified femur: Secondary | ICD-10-CM | POA: Diagnosis not present

## 2015-09-10 DIAGNOSIS — R059 Cough, unspecified: Secondary | ICD-10-CM

## 2015-09-10 DIAGNOSIS — E43 Unspecified severe protein-calorie malnutrition: Secondary | ICD-10-CM | POA: Diagnosis not present

## 2015-09-10 DIAGNOSIS — Z7901 Long term (current) use of anticoagulants: Secondary | ICD-10-CM | POA: Diagnosis not present

## 2015-09-10 DIAGNOSIS — Z681 Body mass index (BMI) 19 or less, adult: Secondary | ICD-10-CM | POA: Diagnosis not present

## 2015-09-10 DIAGNOSIS — L8990 Pressure ulcer of unspecified site, unspecified stage: Secondary | ICD-10-CM | POA: Diagnosis not present

## 2015-09-10 DIAGNOSIS — F17210 Nicotine dependence, cigarettes, uncomplicated: Secondary | ICD-10-CM | POA: Diagnosis present

## 2015-09-10 DIAGNOSIS — A419 Sepsis, unspecified organism: Secondary | ICD-10-CM | POA: Diagnosis not present

## 2015-09-10 DIAGNOSIS — K5660 Unspecified intestinal obstruction: Secondary | ICD-10-CM | POA: Diagnosis present

## 2015-09-10 DIAGNOSIS — E871 Hypo-osmolality and hyponatremia: Secondary | ICD-10-CM | POA: Diagnosis present

## 2015-09-10 DIAGNOSIS — Z86718 Personal history of other venous thrombosis and embolism: Secondary | ICD-10-CM | POA: Diagnosis not present

## 2015-09-10 DIAGNOSIS — N419 Inflammatory disease of prostate, unspecified: Secondary | ICD-10-CM | POA: Diagnosis present

## 2015-09-10 DIAGNOSIS — R64 Cachexia: Secondary | ICD-10-CM | POA: Diagnosis present

## 2015-09-10 DIAGNOSIS — R5381 Other malaise: Secondary | ICD-10-CM | POA: Diagnosis not present

## 2015-09-10 DIAGNOSIS — E870 Hyperosmolality and hypernatremia: Secondary | ICD-10-CM | POA: Diagnosis not present

## 2015-09-10 DIAGNOSIS — R4182 Altered mental status, unspecified: Secondary | ICD-10-CM | POA: Diagnosis not present

## 2015-09-10 DIAGNOSIS — Z882 Allergy status to sulfonamides status: Secondary | ICD-10-CM

## 2015-09-10 DIAGNOSIS — D649 Anemia, unspecified: Secondary | ICD-10-CM | POA: Diagnosis not present

## 2015-09-10 DIAGNOSIS — E785 Hyperlipidemia, unspecified: Secondary | ICD-10-CM | POA: Diagnosis present

## 2015-09-10 DIAGNOSIS — I739 Peripheral vascular disease, unspecified: Secondary | ICD-10-CM | POA: Diagnosis present

## 2015-09-10 DIAGNOSIS — R1310 Dysphagia, unspecified: Secondary | ICD-10-CM | POA: Diagnosis present

## 2015-09-10 DIAGNOSIS — D689 Coagulation defect, unspecified: Secondary | ICD-10-CM | POA: Diagnosis not present

## 2015-09-10 DIAGNOSIS — D62 Acute posthemorrhagic anemia: Secondary | ICD-10-CM | POA: Diagnosis not present

## 2015-09-10 DIAGNOSIS — K5669 Other intestinal obstruction: Secondary | ICD-10-CM | POA: Diagnosis not present

## 2015-09-10 DIAGNOSIS — R1084 Generalized abdominal pain: Secondary | ICD-10-CM | POA: Diagnosis not present

## 2015-09-10 DIAGNOSIS — Z89611 Acquired absence of right leg above knee: Secondary | ICD-10-CM | POA: Diagnosis not present

## 2015-09-10 DIAGNOSIS — H9319 Tinnitus, unspecified ear: Secondary | ICD-10-CM | POA: Diagnosis present

## 2015-09-10 DIAGNOSIS — J9601 Acute respiratory failure with hypoxia: Secondary | ICD-10-CM | POA: Diagnosis not present

## 2015-09-10 DIAGNOSIS — K56609 Unspecified intestinal obstruction, unspecified as to partial versus complete obstruction: Secondary | ICD-10-CM

## 2015-09-10 DIAGNOSIS — G9341 Metabolic encephalopathy: Secondary | ICD-10-CM | POA: Diagnosis not present

## 2015-09-10 HISTORY — DX: Pneumonia, unspecified organism: J18.9

## 2015-09-10 HISTORY — DX: Human immunodeficiency virus (HIV) disease: B20

## 2015-09-10 HISTORY — DX: Unspecified protein-calorie malnutrition: E46

## 2015-09-10 HISTORY — DX: Asymptomatic human immunodeficiency virus (hiv) infection status: Z21

## 2015-09-10 HISTORY — DX: Chronic kidney disease, unspecified: N18.9

## 2015-09-10 LAB — LACTIC ACID, PLASMA: Lactic Acid, Venous: 1.6 mmol/L (ref 0.5–2.0)

## 2015-09-10 LAB — COMPREHENSIVE METABOLIC PANEL
ALK PHOS: 78 U/L (ref 38–126)
ALT: 36 U/L (ref 17–63)
AST: 62 U/L — ABNORMAL HIGH (ref 15–41)
Albumin: 2.3 g/dL — ABNORMAL LOW (ref 3.5–5.0)
Anion gap: 6 (ref 5–15)
BUN: 18 mg/dL (ref 6–20)
CALCIUM: 7.4 mg/dL — AB (ref 8.9–10.3)
CO2: 16 mmol/L — AB (ref 22–32)
CREATININE: 1.87 mg/dL — AB (ref 0.61–1.24)
Chloride: 129 mmol/L — ABNORMAL HIGH (ref 101–111)
GFR calc non Af Amer: 39 mL/min — ABNORMAL LOW (ref >60)
GFR, EST AFRICAN AMERICAN: 45 mL/min — AB (ref >60)
GLUCOSE: 116 mg/dL — AB (ref 65–99)
Potassium: 3.4 mmol/L — ABNORMAL LOW (ref 3.5–5.1)
SODIUM: 151 mmol/L — AB (ref 135–145)
Total Bilirubin: 2.1 mg/dL — ABNORMAL HIGH (ref 0.3–1.2)
Total Protein: 4.9 g/dL — ABNORMAL LOW (ref 6.5–8.1)

## 2015-09-10 LAB — CBC WITH DIFFERENTIAL/PLATELET
BASOS ABS: 0 10*3/uL (ref 0.0–0.1)
BASOS PCT: 0 %
Eosinophils Absolute: 0 10*3/uL (ref 0.0–0.7)
Eosinophils Relative: 0 %
HEMATOCRIT: 24.2 % — AB (ref 39.0–52.0)
HEMOGLOBIN: 8.5 g/dL — AB (ref 13.0–17.0)
LYMPHS PCT: 11 %
Lymphs Abs: 1.9 10*3/uL (ref 0.7–4.0)
MCH: 28.4 pg (ref 26.0–34.0)
MCHC: 35.1 g/dL (ref 30.0–36.0)
MCV: 80.9 fL (ref 78.0–100.0)
MONOS PCT: 6 %
Monocytes Absolute: 1 10*3/uL (ref 0.1–1.0)
NEUTROS ABS: 14.7 10*3/uL — AB (ref 1.7–7.7)
NEUTROS PCT: 83 %
Platelets: 185 10*3/uL (ref 150–400)
RBC: 2.99 MIL/uL — ABNORMAL LOW (ref 4.22–5.81)
RDW: 15.7 % — ABNORMAL HIGH (ref 11.5–15.5)
WBC: 17.6 10*3/uL — ABNORMAL HIGH (ref 4.0–10.5)

## 2015-09-10 LAB — MRSA PCR SCREENING: MRSA by PCR: NEGATIVE

## 2015-09-10 LAB — PROTIME-INR
INR: 1.83 — AB (ref 0.00–1.49)
PROTHROMBIN TIME: 21.1 s — AB (ref 11.6–15.2)

## 2015-09-10 MED ORDER — DEXTROSE-NACL 5-0.45 % IV SOLN
INTRAVENOUS | Status: DC
  Administered 2015-09-10 – 2015-09-11 (×2): via INTRAVENOUS

## 2015-09-10 MED ORDER — MORPHINE SULFATE (PF) 2 MG/ML IV SOLN
2.0000 mg | INTRAVENOUS | Status: DC | PRN
  Administered 2015-09-10: 2 mg via INTRAVENOUS
  Filled 2015-09-10: qty 1

## 2015-09-10 MED ORDER — HYDROMORPHONE HCL 1 MG/ML IJ SOLN
0.5000 mg | INTRAMUSCULAR | Status: DC | PRN
  Administered 2015-09-10 – 2015-09-11 (×5): 0.5 mg via INTRAVENOUS
  Filled 2015-09-10 (×5): qty 1

## 2015-09-10 MED ORDER — SODIUM CHLORIDE 0.9 % IV SOLN
3.0000 g | Freq: Three times a day (TID) | INTRAVENOUS | Status: DC
  Administered 2015-09-11 – 2015-09-16 (×16): 3 g via INTRAVENOUS
  Filled 2015-09-10 (×22): qty 3

## 2015-09-10 MED ORDER — GABAPENTIN 400 MG PO CAPS
400.0000 mg | ORAL_CAPSULE | Freq: Four times a day (QID) | ORAL | Status: DC
  Filled 2015-09-10: qty 1

## 2015-09-10 NOTE — H&P (Signed)
Date: 09/10/2015               Patient Name:  [name] MRN: [id number]  DOB: [birth year] Age / Sex: 54 y.o., male   PCP: Iline Oven, MD         Medical Service: Internal Medicine Teaching Service         Attending Physician: Dr. Campbell Riches, MD    First Contact: Dr. Berline Lopes, MD Pager: [phone]  Second Contact: Dr. Dellia Nims Pager: [phone]       After Hours (After 5p/  First Contact Pager: [phone]  weekends / holidays): Second Contact Pager: [phone]   Chief Complaint: Abdominal Pain  History of Present Illness:  Mr. 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  His symptoms started last week with nausea, vomiting, and reduced appetite. Then, on 11/7 his daughter, with whom he lives, noticed that he had been "vomiting blood." She called 911, and he said he was struggling for his life. On admission to [facility], he was noted to have severe electrolyte abnormalities (e.g. Na 117, K 2.6, AG 32, Ca 3.2) with an INR >10. He was also noted to have dark, tarry stools that were FOBT positive. He was intubated in the ED, and a CT of the C/A/P demonstrated bilateral lung consolidation consistent with pneumonia with possible aspiration, small bowel obstruction, mild right hydronephrosis, and he was transferred to the ICU. He was transfused with 6U FFP and 3U pRBCs, and his INR improved to 1.54. An NG tube was place with suction to decompress his SBO. A repeat CT showed progression of the lung consolidation and wall thickening in the jejunum. The GI team did not feel that a EGD was warranted to query his upper GI bleed. An echo only demonstrated tricuspid regurgitation. He was noted to have a Klebsiella pneumonia  bacteremia with an unclear source, but it was strongly considered that the source was intra-abdominal or from the gallbladder. A HIDA scan showed now cholecystitis but a reduced EF (27%). He was treated with vancomycin/meropenem/levaquin until discharge from [facility] even though sensitivities for blood Klebsiella were nearly pan-sensitive. Despite antibiotics, he WBCs continued to rise, reaching a peak of 19. Upon finishing his course in the ICU, he had requested to be transferred to [facility] because, "That's where they have my HIV medications."  Today, he reports this is major complaint is his abdominal pain, which he describes and "coming and going." He described the pain as all over. He reports the only thing that improves it is pain medication. He says he had two normal bowel movements in the past two days. He denies current nausea, vomiting, or diarrhea. He reports no episodes of hematemesis or hemoptysis since leaving the ICU. He has an unproductive cough. He has epigastric pain, but he denies any chest pain. He has some lower back pain on his left that appears to be long-standing, and he describes phantom limb pain in his RLE. He denies any chills, night sweats, or confusion.   He is followed by Dr. Michel Bickers for his HIV. He is treated with Atazanavir-Cobicistat 300-150 mg daily, Dolutegravir 50 mg daily, Tenofovir 300 mg daily. His last CD4 in or records was 700 on 08/23/15, which  had decreased to 208 on 09/06/15 according to records in [place] from [facility]. His last viral load was undetectable on 10/26. He reports good adherence with his HIV medications prior to his hospitalization. With respect to his anticoagulation, he is managed by Dr. Elie Confer in the [facility] coumadin clinic. Reviewing his visit on 10/24 with Dr. Elie Confer, his INR was 2.4. His INRs have routinely been supratherapeutic, up to 5 or 6.  He is a former smoker and denies alcohol and drug use. He lives at home with his 79 year old  daughter [name]. He says it is very important that neither of his daughters know of his HIV status.  Upon transfer at [facility], he was afebrile and his WBC was 17.6 (NAb 14.7).  He was hypernatremic (154) and mildly hypokalemic (3.4) with an AG of 6. His INR was 1.83. Corrected Calcium was 8.8.   Meds: Current Facility-Administered Medications  Medication Dose Route Frequency Provider Last Rate Last Dose  . dextrose 5 %-0.45 % sodium chloride infusion   Intravenous Continuous Tasrif Ahmed, MD 100 mL/hr at 09/10/15 1853    . gabapentin (NEURONTIN) capsule 400 mg  400 mg Oral QID Dellia Nims, MD   400 mg at 09/10/15 1908    Allergies: Allergies as of 09/10/2015 - Review Complete 09/10/2015  Allergen Reaction Noted  . Dapsone    . Sulfamethoxazole-trimethoprim     Past Medical History  Diagnosis Date  . History of chicken pox   . Hyperlipidemia   . History of DVT of lower extremity     right  . Left hip pain 03/09/2013  . Preventative health care 03/09/2013   Past Surgical History  Procedure Laterality Date  . Appendectomy  1962  . Leg amputation above knee  2010    right leg   Family History  Problem Relation Age of Onset  . Arthritis Mother   . Kidney disease Maternal Grandfather    Social History   Social History  . Marital Status: Married    Spouse Name: N/A  . Number of Children: 3  . Years of Education: N/A   Occupational History  .     Social History Main Topics  . Smoking status: Current Every Day Smoker -- 0.50 packs/day for 36 years    Types: Cigarettes  . Smokeless tobacco: Never Used     Comment: ABOUT 1/2PPD.  Slowing down  . Alcohol Use: No  . Drug Use: No  . Sexual Activity: Not Currently     Comment: declined condoms   Other Topics Concern  . Not on file   Social History Narrative    Review of Systems: Negative except per HPI  Physical Exam: Blood pressure 115/69, pulse 77, temperature 97.9 F (36.6 C), temperature source Oral, resp.  rate 28, height 6' (1.829 m), weight 105 lb 1.6 oz (47.673 kg), SpO2 99 %. General: Chronically ill appearing man, lying in bed, appearing uncomfortable HEENT: Temporal wasting. NG tube in place draining brown fluid. Moist mucous membranes. No tonsillar erythema or exudates.  Cardiovascular: Regular rate and rhythm, no murmurs, rubs, or gallops Pulmonary: Crackles in the right lung base. However did not give best effort due to pain. No wheezes. Abdomen: Exquisitely tender to palpation in all quadrants, especially in the epigastric region. Unable to elicit effective Murphy's sign due to pain. Mildly hypoactive bowel sounds.  Extremities: Right AKA. 2+ DP pulses on left. Wasting of hand muscles noted Skin: Warm and dry. Not diaphoretic.  Neurological: AAOx4.  Tongue midline. Face Symmetric. EOMI. PERRL.   Lab results: Basic Metabolic Panel:  Recent Labs  09/10/15 2022  NA 151*  K 3.4*  CL 129*  CO2 16*  GLUCOSE 116*  BUN 18  CREATININE 1.87*  CALCIUM 7.4*   Liver Function Tests:  Recent Labs  09/10/15 2022  AST 62*  ALT 36  ALKPHOS 78  BILITOT 2.1*  PROT 4.9*  ALBUMIN 2.3*  CBC:  Recent Labs  09/10/15 2022  WBC 17.6*  NEUTROABS 14.7*  HGB 8.5*  HCT 24.2*  MCV 80.9  PLT 185   Coagulation:  Recent Labs  09/10/15 2022  LABPROT 21.1*  INR 1.83*      Imaging results:  Dg Chest Port 1 View  09/10/2015  CLINICAL DATA:  Abdominal pain and cough EXAM: PORTABLE CHEST 1 VIEW COMPARISON:  Three days ago FINDINGS: Interval tracheal extubation. Nasogastric tube reaches the stomach. Right subclavian central line, tip at the lower SVC. Partial clearing of still extensive bilateral airspace disease. No generalized edema, effusion, or air leak. Normal heart size and stable mediastinal contours. Bilateral humeral head avascular necrosis. IMPRESSION: Bilateral pneumonia has improved since 09/07/2015. Electronically Signed   By: Monte Fantasia M.D.   On: 09/10/2015 22:41   Dg  Abd Portable 2v  09/10/2015  CLINICAL DATA:  Abdominal pain and cough. Excess fluid from nasogastric tube EXAM: PORTABLE ABDOMEN - 2 VIEW COMPARISON:  Abdominal CT 09/07/2015 FINDINGS: Nasogastric tube tip near the pylorus. Ongoing partial small bowel obstruction but improved gaseous distension of small bowel loops in the left abdomen. Associated fold thickening persists. Hazy appearance of the lower abdomen correlating with ascites on previous study. No evidence of the pneumoperitoneum or pneumatosis. Avascular necrosis of the left femoral head with collapse. Chest findings described on dedicated study. IMPRESSION: 1. Nasogastric tube tip at the pylorus. 2. Partial small bowel obstruction persists but bowel distention has improved since 09/07/2015. Electronically Signed   By: Monte Fantasia M.D.   On: 09/10/2015 22:32    Assessment & Plan by Problem:  Klebsiella pneumoniae Bacteremia: Currently meets 2/4 SIRS criteria.  Potential sources could be his community acquired versus aspiration pneumonia or his GI tract. His history of alcoholism and vomiting episodes does increase his risk of aspiration, but his abdominal imaging clearly demonstrates inflammation which could facilitate bacterial translocation. His white count has slightly responded, decreasing from 19.8 on 11/11 to 17.2 on admission. He is currently afebrile. Sensitivities demonstrated sensitivity to all tested antibiotics except for ampicillin, however antibiotics were never narrowed at [facility]. Repeat CXR here shows bilateral pneumonia has improved since 11/8.  - Repeat blood culture pending - Unasyn 3g q8h IV for coverage of Klebsiella and possibly anaerobes - CBC with differential twice daily  Abdominal Pain: Known SBO is the primary contributor although there could be a functional cholestatic component given 27% EF on HIDA scan. If indeed there is cholestatic pain not explained by SBO, he would qualify for cholecystectomy given his EF  is <40%. His upper GI bleed has since resolved, but there is some persistent epigastric tenderness on exam that may represent an ulcer. His recent hospitalization for an upper GI bleed with an unknown source in the setting of possible alcohol abuse and his persistent abdominal pain may warrant further evaluation by a gastroenterologist. Repeat abdominal X-ray showed that his SBO has improved since 11/8. The patient takes scheduled MS contin at home 60 mg x 12h with oxycodone IR 5 mg for breakthrough, which may have caused  his SBO. His bowel movements have increased in frequency. - Continue NG suction - Hydromorphone 1 mg q4h prn - Dextrose 5 1/2 NS 100 cc/hr - NPO - Surgery Consult - Consider GI consult  Hematemesis: Unclear source of upper GI bleed. Hgb currently 8.5, but Hgb is typically over 13. This appears to have resolved for now. Differential would include mallory-weiss tear given week of vomiting, gastric ulcer, or possibly varices. - Consider  GI consult - Protonix IV 40 mg  History of Unprovoked DVT: On anticoagulation indefinitely. Patient has consistently been supratherapeutic at anti-coag visits and presented to 10 at [facility]. While it is difficult to ascertain his adherence in the setting of this acute illness, his recent hospitalization invokes whether or not he is a good candidate for warfarin and may benefit from an alternative therapy. - Holding warfarin.   HIV: Patient had a precipitous drop in his CD4 over the course of two weeks, from 700 to 219; however, this is likely related to his acute illness. He reports good adherence with his medications.  - Restart anti-retroviral regimen after NPO status  Acute on Chronic Kidney Disease: Baseline creatinine of ~1.2. Currently 1.87. - Fluid management per above.   Elevated AST: To 62. Likely downtrending from previous septic shock, but could represent underlying liver dysfunction driving up his INR. - Repeat CMP in  AM  Metabolic Acidosis: AG is resolved but persistently has low bicarb at 16. AG likely falsely narrowed in setting of hypernatremia.  Avascular Necrosis of Hip and Phantom Limb Pain: Pain management per above.  Alcohol Abuse: Patient denied alcohol use, although this is incompletely documented in his chart. It is likely this problem has been carried forward. Given his GI bleed of unknown source, it would be warranted to bring this issue up again.   DVT Prophylaxis: SCDs Code Status: Full Admit to: Step down   Dispo: Disposition is deferred at this time, awaiting improvement of current medical problems. Anticipated discharge in approximately 5-7 day(s).   The patient does have a current PCP Iline Oven, MD) and does need an [facility] hospital follow-up appointment after discharge.  The patient does have transportation limitations that hinder transportation to clinic appointments.  Signed: Liberty Handy, MD 09/10/2015, 10:45 PM

## 2015-09-10 NOTE — Progress Notes (Signed)
ANTIBIOTIC CONSULT NOTE - INITIAL  Pharmacy Consult for Unasyn  Indication: Klebsiella Bacteremia, E Coli UTI, Aspiration PNA    Assessment: 54 y/o M transfer from [facility], had been on Merrem/Fluconazole, had been in acute renal failure but Scr has trended down to 1.87 (~7 originally at [facility]), WBC 17.6 (trend at [facility] was 15.6>16.7>19.8), other labs reviewed. Pt is afebrile currently. Cultures from [facility] reviewed.   Plan:  -Unasyn 3g IV q8h -Trend WBC, temp, renal function  -Would have low threshold to broaden anti-biotic coverage  Allergies  Allergen Reactions  . Dapsone     REACTION: fever  . Sulfamethoxazole-Trimethoprim     REACTION: fever   Patient Measurements: Height: 6' (182.9 cm) Weight: 105 lb 1.6 oz (47.673 kg) IBW/kg (Calculated) : 77.6  Vital Signs: Temp: 98.2 F (36.8 C) (11/11 2311) Temp Source: Oral (11/11 2311) BP: 106/63 mmHg (11/11 2311) Pulse Rate: 78 (11/11 2311)  Labs:  Recent Labs  09/10/15 2022  WBC 17.6*  HGB 8.5*  PLT 185  CREATININE 1.87*   Estimated Creatinine Clearance: 30.5 mL/min (by C-G formula based on Cr of 1.87).  Microbiology: Recent Results (from the past 720 hour(s))  MRSA PCR Screening     Status: None   Collection Time: 09/10/15  6:23 PM  Result Value Ref Range Status   MRSA by PCR NEGATIVE NEGATIVE Final    Comment:        The GeneXpert MRSA Assay (FDA approved for NASAL specimens only), is one component of a comprehensive MRSA colonization surveillance program. It is not intended to diagnose MRSA infection nor to guide or monitor treatment for MRSA infections.     Medical History: Past Medical History  Diagnosis Date  . History of chicken pox   . Hyperlipidemia   . History of DVT of lower extremity     right  . Left hip pain 03/09/2013  . Preventative health care 03/09/2013    Narda Bonds 09/10/2015,11:36 PM

## 2015-09-11 ENCOUNTER — Encounter (HOSPITAL_COMMUNITY): Payer: Self-pay

## 2015-09-11 DIAGNOSIS — R109 Unspecified abdominal pain: Secondary | ICD-10-CM

## 2015-09-11 DIAGNOSIS — J150 Pneumonia due to Klebsiella pneumoniae: Secondary | ICD-10-CM | POA: Insufficient documentation

## 2015-09-11 DIAGNOSIS — M87059 Idiopathic aseptic necrosis of unspecified femur: Secondary | ICD-10-CM

## 2015-09-11 LAB — COMPREHENSIVE METABOLIC PANEL
ALBUMIN: 2.2 g/dL — AB (ref 3.5–5.0)
ALT: 33 U/L (ref 17–63)
AST: 56 U/L — AB (ref 15–41)
Alkaline Phosphatase: 78 U/L (ref 38–126)
BUN: 17 mg/dL (ref 6–20)
CO2: 13 mmol/L — AB (ref 22–32)
CREATININE: 1.86 mg/dL — AB (ref 0.61–1.24)
Calcium: 7.3 mg/dL — ABNORMAL LOW (ref 8.9–10.3)
GFR calc Af Amer: 46 mL/min — ABNORMAL LOW (ref >60)
GFR calc non Af Amer: 39 mL/min — ABNORMAL LOW (ref >60)
GLUCOSE: 110 mg/dL — AB (ref 65–99)
Potassium: 2.7 mmol/L — CL (ref 3.5–5.1)
Sodium: 151 mmol/L — ABNORMAL HIGH (ref 135–145)
Total Bilirubin: 1.3 mg/dL — ABNORMAL HIGH (ref 0.3–1.2)
Total Protein: 4.8 g/dL — ABNORMAL LOW (ref 6.5–8.1)

## 2015-09-11 LAB — CBC WITH DIFFERENTIAL/PLATELET
BASOS ABS: 0 10*3/uL (ref 0.0–0.1)
BASOS PCT: 0 %
BASOS PCT: 0 %
Basophils Absolute: 0 10*3/uL (ref 0.0–0.1)
EOS ABS: 0 10*3/uL (ref 0.0–0.7)
EOS PCT: 0 %
Eosinophils Absolute: 0 10*3/uL (ref 0.0–0.7)
Eosinophils Relative: 0 %
HEMATOCRIT: 23.9 % — AB (ref 39.0–52.0)
HEMATOCRIT: 24.5 % — AB (ref 39.0–52.0)
HEMOGLOBIN: 8.5 g/dL — AB (ref 13.0–17.0)
Hemoglobin: 8.2 g/dL — ABNORMAL LOW (ref 13.0–17.0)
LYMPHS ABS: 2.2 10*3/uL (ref 0.7–4.0)
Lymphocytes Relative: 11 %
Lymphocytes Relative: 12 %
Lymphs Abs: 1.9 10*3/uL (ref 0.7–4.0)
MCH: 28.1 pg (ref 26.0–34.0)
MCH: 29.2 pg (ref 26.0–34.0)
MCHC: 34.3 g/dL (ref 30.0–36.0)
MCHC: 34.7 g/dL (ref 30.0–36.0)
MCV: 81.8 fL (ref 78.0–100.0)
MCV: 84.2 fL (ref 78.0–100.0)
MONO ABS: 1 10*3/uL (ref 0.1–1.0)
MONOS PCT: 6 %
Monocytes Absolute: 1 10*3/uL (ref 0.1–1.0)
Monocytes Relative: 6 %
NEUTROS ABS: 13.7 10*3/uL — AB (ref 1.7–7.7)
NEUTROS ABS: 15 10*3/uL — AB (ref 1.7–7.7)
NEUTROS PCT: 82 %
Neutrophils Relative %: 83 %
PLATELETS: 194 10*3/uL (ref 150–400)
Platelets: 191 10*3/uL (ref 150–400)
RBC: 2.92 MIL/uL — ABNORMAL LOW (ref 4.22–5.81)
RBC: 2.91 MIL/uL — AB (ref 4.22–5.81)
RDW: 15.9 % — AB (ref 11.5–15.5)
RDW: 16.5 % — ABNORMAL HIGH (ref 11.5–15.5)
WBC: 16.6 10*3/uL — ABNORMAL HIGH (ref 4.0–10.5)
WBC: 18.2 10*3/uL — AB (ref 4.0–10.5)

## 2015-09-11 MED ORDER — PANTOPRAZOLE SODIUM 40 MG IV SOLR
40.0000 mg | INTRAVENOUS | Status: DC
  Administered 2015-09-11 – 2015-09-21 (×10): 40 mg via INTRAVENOUS
  Filled 2015-09-11 (×10): qty 40

## 2015-09-11 MED ORDER — POTASSIUM CHLORIDE 10 MEQ/50ML IV SOLN
10.0000 meq | INTRAVENOUS | Status: AC
  Administered 2015-09-11 (×2): 10 meq via INTRAVENOUS

## 2015-09-11 MED ORDER — INFLUENZA VAC SPLIT QUAD 0.5 ML IM SUSY
0.5000 mL | PREFILLED_SYRINGE | INTRAMUSCULAR | Status: AC
  Administered 2015-09-12: 0.5 mL via INTRAMUSCULAR
  Filled 2015-09-11: qty 0.5

## 2015-09-11 MED ORDER — DEXTROSE 5 % IV SOLN
INTRAVENOUS | Status: DC
  Administered 2015-09-11 – 2015-09-12 (×2): via INTRAVENOUS

## 2015-09-11 MED ORDER — POTASSIUM CHLORIDE 10 MEQ/50ML IV SOLN
10.0000 meq | INTRAVENOUS | Status: AC
  Administered 2015-09-11 (×4): 10 meq via INTRAVENOUS
  Filled 2015-09-11 (×6): qty 50

## 2015-09-11 MED ORDER — SODIUM BICARBONATE 8.4 % IV SOLN
INTRAVENOUS | Status: DC
  Administered 2015-09-11 – 2015-09-20 (×16): via INTRAVENOUS
  Filled 2015-09-11 (×26): qty 150

## 2015-09-11 NOTE — Progress Notes (Signed)
Subjective: Patient was seen and examined at bedside this morning. Reports having cough productive of blood streaked sputum. Also reports having diffuse abdominal pain. States he had a BM 2 days ago but is still passing flatus.    Objective: Vital signs in last 24 hours: Filed Vitals:   09/11/15 0500 09/11/15 0600 09/11/15 0700 09/11/15 0837  BP: 125/68 123/75 125/72 127/76  Pulse: 92 88 96 82  Temp:    98 F (36.7 C)  TempSrc:    Oral  Resp: _0 Height:      Weight: 100 lb 8 oz (45.587 kg)     SpO2: 98% 98% 98% 99%   Weight change:   Intake/Output Summary (Last 24 hours) at 09/11/15 1032 Last data filed at 09/11/15 0800  Gross per 24 hour  Intake 1111.67 ml  Output   2500 ml  Net -1388.33 ml   Physical Exam  Constitutional: He is oriented to person, place, and time. He appears distressed.  Cachectic   Cardiovascular: Normal rate, regular rhythm and intact distal pulses.  Exam reveals no gallop and no friction rub.   No murmur heard. Pulmonary/Chest: He has no wheezes.  Rhonchi   Abdominal: He exhibits no distension. There is tenderness. There is guarding. There is no rebound.  +BS Abdomen diffusely tender to palpation.   Musculoskeletal:  R AKA stump well healed No LLE edema  Neurological: He is alert and oriented to person, place, and time.  Skin: Skin is warm and dry.   Lab Results: Basic Metabolic Panel:  Recent Labs Lab 09/10/15 2022 09/11/15 0616  NA 151* 151*  K 3.4* 2.7*  CL 129* >130*  CO2 16* 13*  GLUCOSE 116* 110*  BUN 18 17  CREATININE 1.87* 1.86*  CALCIUM 7.4* 7.3*   Liver Function Tests:  Recent Labs Lab 09/10/15 2022 09/11/15 0616  AST 62* 56*  ALT 36 33  ALKPHOS 78 78  BILITOT 2.1* 1.3*  PROT 4.9* 4.8*  ALBUMIN 2.3* 2.2*   CBC:  Recent Labs Lab 09/10/15 2022 09/11/15 0616  WBC 17.6* 16.6*  NEUTROABS 14.7* 13.7*  HGB 8.5* 8.2*  HCT 24.2* 23.9*  MCV 80.9 81.8  PLT 185 194   Coagulation:  Recent Labs Lab  09/10/15 2022  LABPROT 21.1*  INR 1.83*   Urine Drug Screen: Drugs of Abuse     Component Value Date/Time   LABOPIA * 06/23/2009 1831    POSITIVE (NOTE) Result repeated and verified. Sent for confirmatory testing   COCAINSCRNUR NEGATIVE 06/23/2009 1831   LABBENZ NEGATIVE 06/23/2009 1831   AMPHETMU NEGATIVE 06/23/2009 1831    Micro Results: Recent Results (from the past 240 hour(s))  MRSA PCR Screening     Status: None   Collection Time: 09/10/15  6:23 PM  Result Value Ref Range Status   MRSA by PCR NEGATIVE NEGATIVE Final    Comment:        The GeneXpert MRSA Assay (FDA approved for NASAL specimens only), is one component of a comprehensive MRSA colonization surveillance program. It is not intended to diagnose MRSA infection nor to guide or monitor treatment for MRSA infections.   Culture, blood (routine x 2)     Status: None (Preliminary result)   Collection Time: 09/10/15 11:25 PM  Result Value Ref Range Status   Specimen Description BLOOD LEFT ANTECUBITAL  Final   Special Requests BOTTLES DRAWN AEROBIC AND ANAEROBIC 10CC   Final   Culture NO GROWTH < 12 HOURS  Final  Report Status PENDING  Incomplete   Studies/Results: Dg Chest Port 1 View  09/10/2015  CLINICAL DATA:  Abdominal pain and cough EXAM: PORTABLE CHEST 1 VIEW COMPARISON:  Three days ago FINDINGS: Interval tracheal extubation. Nasogastric tube reaches the stomach. Right subclavian central line, tip at the lower SVC. Partial clearing of still extensive bilateral airspace disease. No generalized edema, effusion, or air leak. Normal heart size and stable mediastinal contours. Bilateral humeral head avascular necrosis. IMPRESSION: Bilateral pneumonia has improved since 09/07/2015. Electronically Signed   By: Monte Fantasia M.D.   On: 09/10/2015 22:41   Dg Abd Portable 2v  09/10/2015  CLINICAL DATA:  Abdominal pain and cough. Excess fluid from nasogastric tube EXAM: PORTABLE ABDOMEN - 2 VIEW COMPARISON:   Abdominal CT 09/07/2015 FINDINGS: Nasogastric tube tip near the pylorus. Ongoing partial small bowel obstruction but improved gaseous distension of small bowel loops in the left abdomen. Associated fold thickening persists. Hazy appearance of the lower abdomen correlating with ascites on previous study. No evidence of the pneumoperitoneum or pneumatosis. Avascular necrosis of the left femoral head with collapse. Chest findings described on dedicated study. IMPRESSION: 1. Nasogastric tube tip at the pylorus. 2. Partial small bowel obstruction persists but bowel distention has improved since 09/07/2015. Electronically Signed   By: Monte Fantasia M.D.   On: 09/10/2015 22:32   Medications: I have reviewed the patient's current medications. Scheduled Meds: . ampicillin-sulbactam (UNASYN) IV  3 g Intravenous Q8H  . [START ON 09/12/2015] Influenza vac split quadrivalent PF  0.5 mL Intramuscular Tomorrow-1000  . pantoprazole (PROTONIX) IV  40 mg Intravenous Q24H  . potassium chloride  10 mEq Intravenous Q1 Hr x 6   Continuous Infusions: . dextrose 5 % and 0.45% NaCl 100 mL/hr at 09/11/15 0608   PRN Meds:.HYDROmorphone (DILAUDID) injection Assessment/Plan: Active Problems:   Human immunodeficiency virus (HIV) disease (HCC)   Alcohol abuse   Status post above knee amputation ([place])   Long term current use of anticoagulant   Avascular necrosis of femoral head (HCC)   Septic shock (HCC)   Gram-negative bacteremia (HCC)   Abdominal pain   Pneumonia due to Klebsiella pneumoniae ([place])  Klebsiella pneumoniae Bacteremia: CXR here shows bilateral pneumonia has improved since 11/8. Currently meets 2/4 SIRS criteria.WBC decreased from 17.6 on admission to 16.2. He is currently afebrile but tachypnic (RR 25).  - Repeat blood culture pending - Unasyn 3g q8h IV for coverage of Klebsiella and possibly anaerobes - CBC with differential twice daily  Partial SBO: Abdominal X-ray showing partial SBO, improved  since 11/8. The patient takes scheduled MS contin at home 60 mg x 12h with oxycodone IR 5 mg for breakthrough, which may have caused his SBO. In addition, iron released from recent GI bleed could have also caused constipation. Gallstone ileus is also on the differential. States he has not had a bowel movement in 2 days but is still passing flatus.  - Continue NG suction - Hydromorphone 1 mg q4h prn - Dextrose 5 1/2 NS 100 cc/hr - NPO - Surgery consult if WBC remains elevated   Hematemesis: Unclear source of upper GI bleed. Hgb currently 8.2, with baseline over 13. This appears to have resolved for now. Likely in the setting of elevated INR.  - ConsiderGI consult if Hgb drops - Protonix IV 40 mg -Continue to check H/H  Hypokalemia: K 2.7 this morning. -IV KCl 10 meq q1 x6  History of Unprovoked DVT: On anticoagulation indefinitely. Patient has consistently been supratherapeutic at anti-coag  visits and presented to 10 at [facility].  - Holding warfarin.   HIV: CD4 decreased from 700 to 219 in a matter of 2 weeks, likely in the setting of acute illness. Viral load <20 in 07/2015.    - Restart anti-retroviral regimen after NPO status -F/u HLA B-57 (01)  Acute on Chronic Kidney Disease: Baseline creatinine of ~1.2. Currently 1.86. - Fluid management per above.   Elevated AST: To 62. Likely downtrending from previous septic shock, but could represent underlying liver dysfunction driving up his INR. - F/u repeat CMP  Metabolic Acidosis: AG is resolved but persistently has low bicarb at 16. AG likely falsely narrowed in setting of hypernatremia (Na 151).   Avascular Necrosis of Hip and Phantom Limb Pain: Pain management per above.   DVT Prophylaxis: SCDs Code Status: Full Admit to: Step down  Dispo: Disposition is deferred at this time, awaiting improvement of current medical problems.  Anticipated discharge in approximately 2-3 day(s).   The patient does have a current PCP Iline Oven, MD) and does need an [facility] hospital follow-up appointment after discharge.  The patient does not have transportation limitations that hinder transportation to clinic appointments.  .Services Needed at time of discharge: Y = Yes, Blank = No PT:   OT:   RN:   Equipment:   Other:     LOS: 1 day   Shela Leff, MD 09/11/2015, 10:32 AM

## 2015-09-11 NOTE — Consult Note (Signed)
[facility] Renal Consultation Note  Requesting MD: Hatcher Indication for Consultation: AKi, metabolic acidosis, hypernatremia  HPI:  [name] is a 54 y.o. 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 also reported alcoholism. He was noted to have a creatinine of between 1.0 and 1.1 last known in June of this year. He was admitted to [facility] on November 7 with dark stools and hematemesis with a supratherapeutic INR and a small bowel obstruction. He was also apparently found to be in acute renal failure, hyponatremic and acidotic. He required intubation- he was treated for sepsis- had a Klebsiella bacteremia. He was then transferred to [place] yesterday for continued care. Upon transfer he is found to be hypernatremic at 154, hypokalemic, hyperchloremic and with an elevated creatinine although better than early in the hospitalization. He also continues to have a metabolic acidosis.  These are the reasons for consultation.  He has been on NG suction and nothing by mouth. He is complaining of thirst  CREAT  Date/Time Value Ref Range Status  04/19/2015 02:50 PM 1.10 0.50 - 1.35 mg/dL Final  03/18/2015 02:58 PM 1.29 0.50 - 1.35 mg/dL Final  11/30/2014 02:01 PM 1.34 0.50 - 1.35 mg/dL Final  11/02/2014 03:05 PM 1.17 0.50 - 1.35 mg/dL Final  10/12/2014 02:44 PM 0.98 0.50 - 1.35 mg/dL Final  10/08/2014 02:35 PM 1.23 0.50 - 1.35 mg/dL Final  08/31/2014 09:58 AM 1.42* 0.50 - 1.35 mg/dL Final  04/28/2014 09:03 AM 1.33 0.50 - 1.35 mg/dL Final  01/23/2014 08:51 AM 1.41* 0.50 - 1.35 mg/dL Final  10/03/2013 09:14 AM 1.25 0.50 - 1.35 mg/dL Final  03/19/2013 09:17 AM 1.08 0.50 - 1.35 mg/dL Final  03/07/2012 09:59 AM 0.88 0.50 - 1.35 mg/dL Final   CREATININE, SER  Date/Time Value Ref Range Status  09/11/2015 06:16 AM 1.86* 0.61 - 1.24 mg/dL Final   09/10/2015 08:22 PM 1.87* 0.61 - 1.24 mg/dL Final  04/01/2012 11:05 AM 1.08 0.50 - 1.35 mg/dL Final  11/15/2010 08:20 PM 0.86 0.40-1.50 mg/dL Final  03/24/2010 03:24 PM 0.83 0.40 - 1.50 mg/dL Final  12/14/2009 08:47 PM 0.92 (0.40-1.50 mg/dL Final  09/28/2009 08:35 PM 1.02 (0.40-1.50 mg/dL Final  07/21/2009 06:30 AM 0.82 0.4 - 1.5 mg/dL Final  07/20/2009 10:31 AM 0.77 0.4 - 1.5 mg/dL Final  07/12/2009 07:50 AM 0.65 0.4 - 1.5 mg/dL Final  07/11/2009 05:00 AM 0.68 0.4 - 1.5 mg/dL Final  07/09/2009 04:14 AM 0.65 0.4 - 1.5 mg/dL Final  07/04/2009 04:05 AM 0.60 0.4 - 1.5 mg/dL Final  07/03/2009 10:15 AM 0.60 0.4 - 1.5 mg/dL Final  07/01/2009 04:10 AM 0.55 0.4 - 1.5 mg/dL Final  06/29/2009 05:45 AM 0.47 0.4 - 1.5 mg/dL Final  06/28/2009 05:28 AM 0.48 0.4 - 1.5 mg/dL Final  06/26/2009 05:15 AM 0.61 0.4 - 1.5 mg/dL Final  06/24/2009 05:02 AM 0.63 0.4 - 1.5 mg/dL Final  06/23/2009 07:00 AM 0.67 0.4 - 1.5 mg/dL Final  06/22/2009 10:35 AM 0.62 0.4 - 1.5 mg/dL Final  03/24/2009 08:13 PM 0.87 0.40-1.50 mg/dL Final  03/09/2009 12:29 PM 0.98 0.40-1.50 mg/dL Final  12/17/2008 08:37 PM 0.93 0.40-1.50 mg/dL Final  04/08/2008 08:19 PM 1.10 0.40-1.50 mg/dL Final  12/23/2007 08:47 PM 1.03 0.40-1.50 mg/dL Final  11/05/2006 09:15 PM 1.04 0.40-1.50 mg/dL Final     PMHx:   Past Medical History  Diagnosis Date  . History of chicken   pox   . Hyperlipidemia   . History of DVT of lower extremity     right  . Left hip pain 03/09/2013  . Preventative health care 03/09/2013  . Pneumonia   . Chronic kidney disease     Past Surgical History  Procedure Laterality Date  . Appendectomy  1962  . Leg amputation above knee  2010    right leg    Family Hx:  Family History  Problem Relation Age of Onset  . Arthritis Mother   . Kidney disease Maternal Grandfather     Social History:  reports that he has been smoking Cigarettes.  He has a 18 pack-year smoking history. He has never used smokeless tobacco.  He reports that he does not drink alcohol or use illicit drugs.  Allergies:  Allergies  Allergen Reactions  . Dapsone     REACTION: fever  . Sulfamethoxazole-Trimethoprim     REACTION: fever    Medications: Prior to Admission medications   Medication Sig Start Date End Date Taking? Authorizing Provider  aspirin 81 MG tablet Take 81 mg by mouth daily.     Yes Historical Provider, MD  ENSURE (ENSURE) Take 1 Can by mouth 3 (three) times daily between meals. 08/09/15  Yes John Campbell, MD  fenofibrate (TRICOR) 145 MG tablet Take 1 tablet (145 mg total) by mouth daily. 08/06/15  Yes John Campbell, MD  gabapentin (NEURONTIN) 400 MG capsule TAKE ONE CAPSULE BY MOUTH FOUR TIMES DAILY 05/04/15  Yes John Campbell, MD  morphine (MS CONTIN) 60 MG 12 hr tablet Take 2 tablets (120 mg total) by mouth every 12 (twelve) hours. 08/18/15  Yes Jeffrey C Hatcher, MD  oxyCODONE (OXY IR/ROXICODONE) 5 MG immediate release tablet Take 1 tablet (5 mg total) by mouth every 6 (six) hours as needed for severe pain. 08/18/15  Yes Jeffrey C Hatcher, MD  potassium chloride SA (K-DUR,KLOR-CON) 20 MEQ tablet TAKE 1 TABLET BY MOUTH EVERY DAY 07/01/15  Yes Nicholas A Taylor, MD  [name] 50 MG tablet TAKE 1 TABLET (50 MG TOTAL) BY MOUTH DAILY. 05/13/15  Yes John Campbell, MD  [name] 300 MG tablet TAKE 1 TABLET (300 MG TOTAL) BY MOUTH DAILY. 05/13/15  Yes John Campbell, MD  warfarin (COUMADIN) 5 MG tablet Take 2.5-5 mg by mouth daily. On Sunday Monday Wednesday Friday patient takes 2.5 mg and on all other days patient takes 5 mg.   Yes Historical Provider, MD  EVOTAZ 300-150 MG per tablet TAKE 1 TABLET BY MOUTH DAILY. SWALLOW WHOLE. DO NOT CRUSH, CUT OR CHEW TABLET. TAKE WITH FOOD. 06/08/15   John Campbell, MD    I have reviewed the patient's current medications.  Labs:  Results for orders placed or performed during the hospital encounter of 09/10/15 (from the past 48 hour(s))  MRSA PCR Screening     Status: None   Collection  Time: 09/10/15  6:23 PM  Result Value Ref Range   MRSA by PCR NEGATIVE NEGATIVE    Comment:        The GeneXpert MRSA Assay (FDA approved for NASAL specimens only), is one component of a comprehensive MRSA colonization surveillance program. It is not intended to diagnose MRSA infection nor to guide or monitor treatment for MRSA infections.   Comprehensive metabolic panel     Status: Abnormal   Collection Time: 09/10/15  8:22 PM  Result Value Ref Range   Sodium 151 (H) 135 - 145 mmol/L   Potassium 3.4 (L) 3.5 - 5.1 mmol/L     Chloride 129 (H) 101 - 111 mmol/L   CO2 16 (L) 22 - 32 mmol/L   Glucose, Bld 116 (H) 65 - 99 mg/dL   BUN 18 6 - 20 mg/dL   Creatinine, Ser 1.87 (H) 0.61 - 1.24 mg/dL   Calcium 7.4 (L) 8.9 - 10.3 mg/dL   Total Protein 4.9 (L) 6.5 - 8.1 g/dL   Albumin 2.3 (L) 3.5 - 5.0 g/dL   AST 62 (H) 15 - 41 U/L   ALT 36 17 - 63 U/L   Alkaline Phosphatase 78 38 - 126 U/L   Total Bilirubin 2.1 (H) 0.3 - 1.2 mg/dL   GFR calc non Af Amer 39 (L) >60 mL/min   GFR calc Af Amer 45 (L) >60 mL/min    Comment: (NOTE) The eGFR has been calculated using the CKD EPI equation. This calculation has not been validated in all clinical situations. eGFR's persistently <60 mL/min signify possible Chronic Kidney Disease.    Anion gap 6 5 - 15  CBC with Differential/Platelet     Status: Abnormal   Collection Time: 09/10/15  8:22 PM  Result Value Ref Range   WBC 17.6 (H) 4.0 - 10.5 K/uL   RBC 2.99 (L) 4.22 - 5.81 MIL/uL   Hemoglobin 8.5 (L) 13.0 - 17.0 g/dL   HCT 24.2 (L) 39.0 - 52.0 %   MCV 80.9 78.0 - 100.0 fL   MCH 28.4 26.0 - 34.0 pg   MCHC 35.1 30.0 - 36.0 g/dL   RDW 15.7 (H) 11.5 - 15.5 %   Platelets 185 150 - 400 K/uL   Neutrophils Relative % 83 %   Neutro Abs 14.7 (H) 1.7 - 7.7 K/uL   Lymphocytes Relative 11 %   Lymphs Abs 1.9 0.7 - 4.0 K/uL   Monocytes Relative 6 %   Monocytes Absolute 1.0 0.1 - 1.0 K/uL   Eosinophils Relative 0 %   Eosinophils Absolute 0.0 0.0 - 0.7  K/uL   Basophils Relative 0 %   Basophils Absolute 0.0 0.0 - 0.1 K/uL  Lactic acid, plasma     Status: None   Collection Time: 09/10/15  8:22 PM  Result Value Ref Range   Lactic Acid, Venous 1.6 0.5 - 2.0 mmol/L  Protime-INR     Status: Abnormal   Collection Time: 09/10/15  8:22 PM  Result Value Ref Range   Prothrombin Time 21.1 (H) 11.6 - 15.2 seconds   INR 1.83 (H) 0.00 - 1.49  Culture, blood (routine x 2)     Status: None (Preliminary result)   Collection Time: 09/10/15 11:25 PM  Result Value Ref Range   Specimen Description BLOOD LEFT ANTECUBITAL    Special Requests BOTTLES DRAWN AEROBIC AND ANAEROBIC 10CC     Culture NO GROWTH < 12 HOURS    Report Status PENDING   CBC WITH DIFFERENTIAL     Status: Abnormal   Collection Time: 09/11/15  6:16 AM  Result Value Ref Range   WBC 16.6 (H) 4.0 - 10.5 K/uL   RBC 2.92 (L) 4.22 - 5.81 MIL/uL   Hemoglobin 8.2 (L) 13.0 - 17.0 g/dL   HCT 23.9 (L) 39.0 - 52.0 %   MCV 81.8 78.0 - 100.0 fL   MCH 28.1 26.0 - 34.0 pg   MCHC 34.3 30.0 - 36.0 g/dL   RDW 15.9 (H) 11.5 - 15.5 %   Platelets 194 150 - 400 K/uL   Neutrophils Relative % 83 %   Neutro Abs 13.7 (H) 1.7 - 7.7 K/uL  Lymphocytes Relative 11 %   Lymphs Abs 1.9 0.7 - 4.0 K/uL   Monocytes Relative 6 %   Monocytes Absolute 1.0 0.1 - 1.0 K/uL   Eosinophils Relative 0 %   Eosinophils Absolute 0.0 0.0 - 0.7 K/uL   Basophils Relative 0 %   Basophils Absolute 0.0 0.0 - 0.1 K/uL  Comprehensive metabolic panel     Status: Abnormal   Collection Time: 09/11/15  6:16 AM  Result Value Ref Range   Sodium 151 (H) 135 - 145 mmol/L   Potassium 2.7 (LL) 3.5 - 5.1 mmol/L    Comment: CRITICAL RESULT CALLED TO, READ BACK BY AND VERIFIED WITH: KHOKHAL S,RN 09/11/15 0702 WAYK    Chloride >130 (HH) 101 - 111 mmol/L    Comment: CRITICAL RESULT CALLED TO, READ BACK BY AND VERIFIED WITH: KHOKHAL S,RN 09/11/15 0702 WAYK    CO2 13 (L) 22 - 32 mmol/L   Glucose, Bld 110 (H) 65 - 99 mg/dL   BUN 17 6 - 20  mg/dL   Creatinine, Ser 1.86 (H) 0.61 - 1.24 mg/dL   Calcium 7.3 (L) 8.9 - 10.3 mg/dL   Total Protein 4.8 (L) 6.5 - 8.1 g/dL   Albumin 2.2 (L) 3.5 - 5.0 g/dL   AST 56 (H) 15 - 41 U/L   ALT 33 17 - 63 U/L   Alkaline Phosphatase 78 38 - 126 U/L   Total Bilirubin 1.3 (H) 0.3 - 1.2 mg/dL   GFR calc non Af Amer 39 (L) >60 mL/min   GFR calc Af Amer 46 (L) >60 mL/min    Comment: (NOTE) The eGFR has been calculated using the CKD EPI equation. This calculation has not been validated in all clinical situations. eGFR's persistently <60 mL/min signify possible Chronic Kidney Disease.      ROS:  A comprehensive review of systems was negative except for: Constitutional: positive for Thirst Gastrointestinal: positive for abdominal pain  Physical Exam: Filed Vitals:   09/11/15 1134  BP: 129/74  Pulse: 87  Temp: 98.5 F (36.9 C)  Resp: 30     General: Very thin, almost emaciated black male who is in some distress secondary to abdominal pain HEENT: Pupils equal round reactive to light, extra ocular motions are intact, mucous membranes dry Neck: There is no JVD Heart: Regular rate and rhythm Lungs: Clear to auscultation bilaterally Abdomen: Decreased breath sounds, tender to palpation Extremities: No edema Skin: Warm and dry Neuro: Alert and nonfocal- some difficulty in understanding his speech  Assessment/Plan: 54-year-old black male who is chronically ill transferred here after an acute illness and found to have some electrolyte abnormalities 1.Renal- acute renal failure likely due to previous sepsis and likely volume depletion. Renal function is better than it was previously. Does not appear that he required any dialysis therapy. Is making good urine. Renal function is approaching normal for him. 2. Metabolic acidosis  - is non-gap. The most likely etiology is NG tube suction that he's likely had for several days. We will supplement with IV bicarbonate. 3. Hypernatremia and  hyperchloremia- likely secondary to a lot of normal saline given at the other hospital. He has a significant free water deficit. In light of that I would probably give him IV water (d5) instead of half normal saline. I will adjust 4. Hypokalemia- agree with repletion 5. Anemia  - significant in nature. Possibly some due to HIV and recent significant hospitalization as well as acute kidney injury. Supportive care for now  Thank you for this   consultation. I will continue to follow with you  , A 09/11/2015, 1:08 PM      

## 2015-09-11 NOTE — Progress Notes (Addendum)
Pt had one episode of mucousy  cough with bright red blood streak. MD is aware will cont monitor pt.

## 2015-09-11 NOTE — Progress Notes (Signed)
CRITICAL VALUE ALERT  Critical value received:  K+ 2.7, chloride >130  Date of notification:  09/11/2015  Time of notification:  0703  Critical value read back: Yes  Nurse who received alert:  Eulis Foster RN  MD notified (1st page):  Teaching service  Time of first page:  0704  MD notified (2nd page):  Time of second page:  Responding MD: Teaching service    Time MD responded:  [phone]

## 2015-09-12 DIAGNOSIS — A419 Sepsis, unspecified organism: Secondary | ICD-10-CM | POA: Diagnosis not present

## 2015-09-12 LAB — CBC WITH DIFFERENTIAL/PLATELET
BASOS PCT: 0 %
Basophils Absolute: 0 10*3/uL (ref 0.0–0.1)
Basophils Absolute: 0 10*3/uL (ref 0.0–0.1)
Basophils Relative: 0 %
EOS ABS: 0 10*3/uL (ref 0.0–0.7)
EOS PCT: 0 %
Eosinophils Absolute: 0 10*3/uL (ref 0.0–0.7)
Eosinophils Relative: 0 %
HCT: 24.7 % — ABNORMAL LOW (ref 39.0–52.0)
HCT: 25.4 % — ABNORMAL LOW (ref 39.0–52.0)
HEMOGLOBIN: 8.4 g/dL — AB (ref 13.0–17.0)
Hemoglobin: 8.6 g/dL — ABNORMAL LOW (ref 13.0–17.0)
LYMPHS ABS: 2.6 10*3/uL (ref 0.7–4.0)
LYMPHS PCT: 16 %
Lymphocytes Relative: 14 %
Lymphs Abs: 2.2 10*3/uL (ref 0.7–4.0)
MCH: 28.4 pg (ref 26.0–34.0)
MCH: 28.5 pg (ref 26.0–34.0)
MCHC: 34 g/dL (ref 30.0–36.0)
MCHC: 33.9 g/dL (ref 30.0–36.0)
MCV: 83.4 fL (ref 78.0–100.0)
MCV: 84.1 fL (ref 78.0–100.0)
Monocytes Absolute: 0.7 10*3/uL (ref 0.1–1.0)
Monocytes Absolute: 0.5 10*3/uL (ref 0.1–1.0)
Monocytes Relative: 5 %
Monocytes Relative: 3 %
NEUTROS ABS: 13 10*3/uL — AB (ref 1.7–7.7)
NEUTROS PCT: 81 %
Neutro Abs: 12.5 10*3/uL — ABNORMAL HIGH (ref 1.7–7.7)
Neutrophils Relative %: 81 %
PLATELETS: 207 10*3/uL (ref 150–400)
Platelets: 199 10*3/uL (ref 150–400)
RBC: 2.96 MIL/uL — AB (ref 4.22–5.81)
RBC: 3.02 MIL/uL — AB (ref 4.22–5.81)
RDW: 16.7 % — ABNORMAL HIGH (ref 11.5–15.5)
RDW: 17.1 % — ABNORMAL HIGH (ref 11.5–15.5)
WBC: 15.9 10*3/uL — AB (ref 4.0–10.5)
WBC: 15.6 10*3/uL — AB (ref 4.0–10.5)

## 2015-09-12 LAB — COMPREHENSIVE METABOLIC PANEL
ALBUMIN: 2.2 g/dL — AB (ref 3.5–5.0)
ALK PHOS: 71 U/L (ref 38–126)
ALT: 28 U/L (ref 17–63)
AST: 49 U/L — AB (ref 15–41)
Anion gap: 6 (ref 5–15)
BUN: 18 mg/dL (ref 6–20)
CALCIUM: 7.3 mg/dL — AB (ref 8.9–10.3)
CO2: 15 mmol/L — AB (ref 22–32)
CREATININE: 1.75 mg/dL — AB (ref 0.61–1.24)
Chloride: 130 mmol/L — ABNORMAL HIGH (ref 101–111)
GFR calc Af Amer: 49 mL/min — ABNORMAL LOW (ref >60)
GFR calc non Af Amer: 42 mL/min — ABNORMAL LOW (ref >60)
GLUCOSE: 143 mg/dL — AB (ref 65–99)
Potassium: 2.3 mmol/L — CL (ref 3.5–5.1)
SODIUM: 151 mmol/L — AB (ref 135–145)
Total Bilirubin: 1.4 mg/dL — ABNORMAL HIGH (ref 0.3–1.2)
Total Protein: 4.7 g/dL — ABNORMAL LOW (ref 6.5–8.1)

## 2015-09-12 LAB — PROTIME-INR
INR: 2.26 — ABNORMAL HIGH (ref 0.00–1.49)
Prothrombin Time: 24.8 seconds — ABNORMAL HIGH (ref 11.6–15.2)

## 2015-09-12 LAB — BASIC METABOLIC PANEL
BUN: 15 mg/dL (ref 6–20)
CO2: 14 mmol/L — AB (ref 22–32)
CREATININE: 1.56 mg/dL — AB (ref 0.61–1.24)
Calcium: 7.6 mg/dL — ABNORMAL LOW (ref 8.9–10.3)
GFR calc Af Amer: 56 mL/min — ABNORMAL LOW (ref >60)
GFR calc non Af Amer: 49 mL/min — ABNORMAL LOW (ref >60)
Glucose, Bld: 128 mg/dL — ABNORMAL HIGH (ref 65–99)
POTASSIUM: 2.5 mmol/L — AB (ref 3.5–5.1)
Sodium: 155 mmol/L — ABNORMAL HIGH (ref 135–145)

## 2015-09-12 LAB — MAGNESIUM: Magnesium: 1.5 mg/dL — ABNORMAL LOW (ref 1.7–2.4)

## 2015-09-12 MED ORDER — WHITE PETROLATUM GEL
Status: AC
  Administered 2015-09-12: 0.2
  Filled 2015-09-12: qty 1

## 2015-09-12 MED ORDER — POTASSIUM CHLORIDE 10 MEQ/50ML IV SOLN
10.0000 meq | INTRAVENOUS | Status: AC
  Administered 2015-09-12 (×6): 10 meq via INTRAVENOUS
  Filled 2015-09-12 (×5): qty 50

## 2015-09-12 MED ORDER — POTASSIUM CL IN DEXTROSE 5% 20 MEQ/L IV SOLN
20.0000 meq | INTRAVENOUS | Status: AC
  Administered 2015-09-12 – 2015-09-13 (×2): 20 meq via INTRAVENOUS
  Filled 2015-09-12 (×3): qty 1000

## 2015-09-12 MED ORDER — POTASSIUM CL IN DEXTROSE 5% 20 MEQ/L IV SOLN
20.0000 meq | INTRAVENOUS | Status: DC
  Administered 2015-09-12 (×2): 20 meq via INTRAVENOUS
  Filled 2015-09-12 (×3): qty 1000

## 2015-09-12 MED ORDER — MAGNESIUM SULFATE 2 GM/50ML IV SOLN
2.0000 g | Freq: Once | INTRAVENOUS | Status: AC
  Administered 2015-09-12: 2 g via INTRAVENOUS
  Filled 2015-09-12: qty 50

## 2015-09-12 MED ORDER — HYDROMORPHONE HCL 1 MG/ML IJ SOLN
1.0000 mg | INTRAMUSCULAR | Status: DC | PRN
Start: 2015-09-12 — End: 2015-09-17
  Administered 2015-09-12 – 2015-09-17 (×14): 1 mg via INTRAVENOUS
  Filled 2015-09-12 (×15): qty 1

## 2015-09-12 NOTE — Progress Notes (Signed)
Subjective:  Stable overnight- continued elyte abnormalities Objective Vital signs in last 24 hours: Filed Vitals:   09/12/15 0200 09/12/15 0357 09/12/15 0453 09/12/15 0827  BP: 126/76 120/66  130/75  Pulse: 95 69  80  Temp:  98.2 F (36.8 C)  99.1 F (37.3 C)  TempSrc:  Oral  Oral  Resp: 37 23  21  Height:      Weight:   46.675 kg (102 lb 14.4 oz)   SpO2: 99% 100%  98%   Weight change: -0.998 kg (-2 lb 3.2 oz)  Intake/Output Summary (Last 24 hours) at 09/12/15 0847 Last data filed at 09/12/15 0700  Gross per 24 hour  Intake 3329.16 ml  Output   2800 ml  Net 529.16 ml    Assessment/Plan: 54 year old black male who is chronically ill transferred here after an acute illness and found to have some electrolyte abnormalities 1.Renal- acute renal failure likely due to previous sepsis and likely volume depletion. Renal function is better than it was previously. Does not appear that he required any dialysis therapy. Is making good urine. Renal function is approaching normal for him. 2. Metabolic acidosis - is non-gap. The most likely etiology is NG tube suction that he's likely had for several days. We will supplement with IV bicarbonate- slightly better but is driving K in to cells. 3. Hypernatremia and hyperchloremia- likely secondary to a lot of normal saline given at the other hospital. He has a significant free water deficit. Needs more free water will increase rate and put a little K in it as well 4. Hypokalemia- agree with repletion- more aggressive and put KCL in his D5 as well  5. Anemia - significant in nature. Possibly some due to HIV and recent significant hospitalization as well as acute kidney injury. Supportive care for now 6. Dysphagia is really complicating   [name] A    Labs: Basic Metabolic Panel:  Recent Labs Lab 09/10/15 2022 09/11/15 0616 09/12/15 0256  NA 151* 151* 151*  K 3.4* 2.7* 2.3*  CL 129* >130* 130*  CO2 16* 13* 15*  GLUCOSE 116*  110* 143*  BUN 18 17 18   CREATININE 1.87* 1.86* 1.75*  CALCIUM 7.4* 7.3* 7.3*   Liver Function Tests:  Recent Labs Lab 09/10/15 2022 09/11/15 0616 09/12/15 0256  AST 62* 56* 49*  ALT 36 33 28  ALKPHOS 78 78 71  BILITOT 2.1* 1.3* 1.4*  PROT 4.9* 4.8* 4.7*  ALBUMIN 2.3* 2.2* 2.2*   No results for input(s): LIPASE, AMYLASE in the last 168 hours. No results for input(s): AMMONIA in the last 168 hours. CBC:  Recent Labs Lab 09/10/15 2022 09/11/15 0616 09/11/15 1525 09/12/15 0256  WBC 17.6* 16.6* 18.2* 15.9*  NEUTROABS 14.7* 13.7* 15.0* 13.0*  HGB 8.5* 8.2* 8.5* 8.4*  HCT 24.2* 23.9* 24.5* 24.7*  MCV 80.9 81.8 84.2 83.4  PLT 185 194 191 199   Cardiac Enzymes: No results for input(s): CKTOTAL, CKMB, CKMBINDEX, TROPONINI in the last 168 hours. CBG: No results for input(s): GLUCAP in the last 168 hours.  Iron Studies: No results for input(s): IRON, TIBC, TRANSFERRIN, FERRITIN in the last 72 hours. Studies/Results: Dg Chest Port 1 View  09/10/2015  CLINICAL DATA:  Abdominal pain and cough EXAM: PORTABLE CHEST 1 VIEW COMPARISON:  Three days ago FINDINGS: Interval tracheal extubation. Nasogastric tube reaches the stomach. Right subclavian central line, tip at the lower SVC. Partial clearing of still extensive bilateral airspace disease. No generalized edema, effusion, or air leak. Normal heart  size and stable mediastinal contours. Bilateral humeral head avascular necrosis. IMPRESSION: Bilateral pneumonia has improved since 09/07/2015. Electronically Signed   By: Monte Fantasia M.D.   On: 09/10/2015 22:41   Dg Abd Portable 2v  09/10/2015  CLINICAL DATA:  Abdominal pain and cough. Excess fluid from nasogastric tube EXAM: PORTABLE ABDOMEN - 2 VIEW COMPARISON:  Abdominal CT 09/07/2015 FINDINGS: Nasogastric tube tip near the pylorus. Ongoing partial small bowel obstruction but improved gaseous distension of small bowel loops in the left abdomen. Associated fold thickening persists.  Hazy appearance of the lower abdomen correlating with ascites on previous study. No evidence of the pneumoperitoneum or pneumatosis. Avascular necrosis of the left femoral head with collapse. Chest findings described on dedicated study. IMPRESSION: 1. Nasogastric tube tip at the pylorus. 2. Partial small bowel obstruction persists but bowel distention has improved since 09/07/2015. Electronically Signed   By: Monte Fantasia M.D.   On: 09/10/2015 22:32   Medications: Infusions: . dextrose 75 mL/hr at 09/12/15 0430  .  sodium bicarbonate  infusion 1000 mL 75 mL/hr at 09/12/15 0430    Scheduled Medications: . ampicillin-sulbactam (UNASYN) IV  3 g Intravenous Q8H  . Influenza vac split quadrivalent PF  0.5 mL Intramuscular Tomorrow-1000  . pantoprazole (PROTONIX) IV  40 mg Intravenous Q24H  . potassium chloride  10 mEq Intravenous Q1 Hr x 6    have reviewed scheduled and prn medications.  Physical Exam: General: NAD- getting ready to watch football today  Heart: RRR Lungs: mostly clear Abdomen: soft, non tender Extremities: no edema     09/12/2015,8:47 AM  LOS: 2 days

## 2015-09-12 NOTE — Progress Notes (Signed)
CRITICAL VALUE ALERT  Critical value received:  K+ 2.5   Chloride >130  Date of notification:  08/1315  Time of notification:  1815  Critical value read back - Yes  Nurse who received alert:  Honor Loh RN  MD notified (1st page):  Dr Posey Pronto  Time of first page:  [phone]

## 2015-09-12 NOTE — Progress Notes (Signed)
  Date: 09/12/2015  Patient name: [place] record number: [id number]  Date of birth: [birth year]   This patient's plan of care was discussed with the house staff. Please see their note for complete details. I concur with their findings.  1. ARF, CRI, acidosis His Cr is improved somewhat.  His Na remains up, potassium remains low.  Greatly appreciate renal eval  2. GI bleed H/h stable INR up (prev DVT), not getting coumadin IV protonix Consider GI eval  3. SBO continue NGT (dark fluid being removed) No BM Wbc is improving.  Consider repeat Abd films  4. Bilateral pna,  5. Klebsiella bacteremia Day 8 anbx (unsayn) Assume GI source (could be from pna as well) Repeat BCx pending  6. HIV+ Continue to hold ART while NPO He has previously been well controlled.  [id number] pending  Campbell Riches, MD 09/12/2015, 9:27 AM

## 2015-09-12 NOTE — Progress Notes (Signed)
Subjective: States pain is better today. Still having high dark NGT output. Denies hemoptysis, n/v/diarrhea/fever/chills.    Objective: Vital signs in last 24 hours: Filed Vitals:   09/12/15 0000 09/12/15 0200 09/12/15 0357 09/12/15 0453  BP: 130/80 126/76 120/66   Pulse: 93 95 69   Temp: 98 F (36.7 C)  98.2 F (36.8 C)   TempSrc: Oral  Oral   Resp: 30 37 23   Height:      Weight:    102 lb 14.4 oz (46.675 kg)  SpO2: 99% 99% 100%    Weight change: -2 lb 3.2 oz (-0.998 kg)  Intake/Output Summary (Last 24 hours) at 09/12/15 3664 Last data filed at 09/12/15 0700  Gross per 24 hour  Intake 3329.16 ml  Output   2800 ml  Net 529.16 ml   Physical Exam  Constitutional: He is oriented to person, place, and time. No distress.  Cachectic   Cardiovascular: Normal rate, regular rhythm and intact distal pulses.  Exam reveals no gallop and no friction rub.   No murmur heard. Pulmonary/Chest: He has no wheezes.  Rhonchi   Abdominal: He exhibits no distension. There is tenderness. There is no rebound and no guarding.  Hypoactive bowel sounds. Tenderness is much improved currently. Has dark NGT output.   Musculoskeletal:  R AKA stump well healed No LLE edema  Neurological: He is alert and oriented to person, place, and time.  Skin: Skin is warm and dry.   Lab Results: Basic Metabolic Panel:  Recent Labs Lab 09/11/15 0616 09/12/15 0256 09/12/15 0430  NA 151* 151*  --   K 2.7* 2.3*  --   CL >130* 130*  --   CO2 13* 15*  --   GLUCOSE 110* 143*  --   BUN 17 18  --   CREATININE 1.86* 1.75*  --   CALCIUM 7.3* 7.3*  --   MG  --   --  1.5*   Liver Function Tests:  Recent Labs Lab 09/11/15 0616 09/12/15 0256  AST 56* 49*  ALT 33 28  ALKPHOS 78 71  BILITOT 1.3* 1.4*  PROT 4.8* 4.7*  ALBUMIN 2.2* 2.2*   CBC:  Recent Labs Lab 09/11/15 1525 09/12/15 0256  WBC 18.2* 15.9*  NEUTROABS 15.0* 13.0*  HGB 8.5* 8.4*  HCT 24.5* 24.7*  MCV 84.2 83.4  PLT 191 199    Coagulation:  Recent Labs Lab 09/10/15 2022 09/12/15 0256  LABPROT 21.1* 24.8*  INR 1.83* 2.26*   Urine Drug Screen: Drugs of Abuse     Component Value Date/Time   LABOPIA * 06/23/2009 1831    POSITIVE (NOTE) Result repeated and verified. Sent for confirmatory testing   COCAINSCRNUR NEGATIVE 06/23/2009 1831   LABBENZ NEGATIVE 06/23/2009 1831   AMPHETMU NEGATIVE 06/23/2009 1831    Micro Results: Recent Results (from the past 240 hour(s))  MRSA PCR Screening     Status: None   Collection Time: 09/10/15  6:23 PM  Result Value Ref Range Status   MRSA by PCR NEGATIVE NEGATIVE Final    Comment:        The GeneXpert MRSA Assay (FDA approved for NASAL specimens only), is one component of a comprehensive MRSA colonization surveillance program. It is not intended to diagnose MRSA infection nor to guide or monitor treatment for MRSA infections.   Culture, blood (routine x 2)     Status: None (Preliminary result)   Collection Time: 09/10/15 11:25 PM  Result Value Ref Range Status  Specimen Description BLOOD LEFT ANTECUBITAL  Final   Special Requests BOTTLES DRAWN AEROBIC AND ANAEROBIC 10CC   Final   Culture NO GROWTH < 12 HOURS  Final   Report Status PENDING  Incomplete   Studies/Results: Dg Chest Port 1 View  09/10/2015  CLINICAL DATA:  Abdominal pain and cough EXAM: PORTABLE CHEST 1 VIEW COMPARISON:  Three days ago FINDINGS: Interval tracheal extubation. Nasogastric tube reaches the stomach. Right subclavian central line, tip at the lower SVC. Partial clearing of still extensive bilateral airspace disease. No generalized edema, effusion, or air leak. Normal heart size and stable mediastinal contours. Bilateral humeral head avascular necrosis. IMPRESSION: Bilateral pneumonia has improved since 09/07/2015. Electronically Signed   By: Monte Fantasia M.D.   On: 09/10/2015 22:41   Dg Abd Portable 2v  09/10/2015  CLINICAL DATA:  Abdominal pain and cough. Excess fluid from  nasogastric tube EXAM: PORTABLE ABDOMEN - 2 VIEW COMPARISON:  Abdominal CT 09/07/2015 FINDINGS: Nasogastric tube tip near the pylorus. Ongoing partial small bowel obstruction but improved gaseous distension of small bowel loops in the left abdomen. Associated fold thickening persists. Hazy appearance of the lower abdomen correlating with ascites on previous study. No evidence of the pneumoperitoneum or pneumatosis. Avascular necrosis of the left femoral head with collapse. Chest findings described on dedicated study. IMPRESSION: 1. Nasogastric tube tip at the pylorus. 2. Partial small bowel obstruction persists but bowel distention has improved since 09/07/2015. Electronically Signed   By: Monte Fantasia M.D.   On: 09/10/2015 22:32   Medications: I have reviewed the patient's current medications. Scheduled Meds: . ampicillin-sulbactam (UNASYN) IV  3 g Intravenous Q8H  . Influenza vac split quadrivalent PF  0.5 mL Intramuscular Tomorrow-1000  . pantoprazole (PROTONIX) IV  40 mg Intravenous Q24H  . potassium chloride  10 mEq Intravenous Q1 Hr x 6   Continuous Infusions: . dextrose 75 mL/hr at 09/12/15 0430  .  sodium bicarbonate  infusion 1000 mL 75 mL/hr at 09/12/15 0430   PRN Meds:.HYDROmorphone (DILAUDID) injection Assessment/Plan: Active Problems:   Human immunodeficiency virus (HIV) disease (HCC)   Alcohol abuse   Status post above knee amputation ([place])   Long term current use of anticoagulant   Avascular necrosis of femoral head (HCC)   Septic shock (HCC)   Gram-negative bacteremia (HCC)   Abdominal pain   Pneumonia due to Klebsiella pneumoniae ([place])  Septic shock 2/2 to Klebsiella pneumoniae Bacteremia: likely from GI (aspiratin) vs lung improving. Was on pressors at [facility]. Imaging shows bilateral pneumonia has improved since 11/8. Afebrile currently.  - Repeat blood culture pending - Unasyn 3g q8h IV for coverage of Klebsiella and possibly anaerobes  Partial SBO:  Abdominal X-ray showing partial SBO, improved since 11/8. The patient takes scheduled MS contin at home 60 mg x 12h with oxycodone IR 5 mg for breakthrough, which may have caused his SBO. In addition, iron released from recent GI bleed could have also caused constipation. Gallstone ileus is also on the differential.  Has hypoactive BS, continues to have dark fluid from NG tube.  - Continue NG suction - Hydromorphone 1 mg q4h prn - D5 fluid per nephro - NPO  Hematemesis: Unclear source of upper GI bleed. Hgb currently 8.2, with baseline over 13. This appears to have resolved for now. Likely in the setting of elevated INR.  - ConsiderGI consult if Hgb drops - Protonix IV 40 mg - trend cbc  High INR without warfarin - with History of Unprovoked DVT:  On anticoagulation indefinitely. Patient has consistently been supratherapeutic at anti-coag visits and presented to 10 at [facility].  - Holding warfarin but patient is auto-anticoagulation likely 2/2 to sepsis causing synthetic dysfunction of liver.  - cont monitoring cbc and INR  Acute on Chronic Kidney Disease - likely 2/2 to ATN: Baseline creatinine of ~1.2. Was in 7's at outside hospital, now improving to 1.75. Has good UOP.  - continue to monitor. Cont fluids.   Non gap Metabolic Acidosis: AG is resolved as no longer has lactic acidosis but persistently has low bicarb at 15 - this is likely 2/2 to Normal Saline causing volume expansion. I don't think this is 2/2 to NG tube suction as that would cause met alkalosis.  - appreciate nephro recs. Continue IV bicarb.  Hypokalemia and hypoMag -  -IV KCl 10 meq q1 x6 + replete Mag  Hypernatremia - likely 2/2 to free water deficit - received Normal Saline extensively before transferring to [place]. Continue D5 to replete free water deficit and also to help with hypernatremia.   HIV: CD4 decreased from 700 to 219 in a matter of 2 weeks, likely in the setting of acute illness. Viral load <20 in  07/2015.    - Restart anti-retroviral regimen after NPO status -F/u HLA B-57 (01)   Elevated AST: To 62. Likely downtrending from previous septic shock, but could represent underlying liver dysfunction driving up his INR. - F/u repeat CMP  Avascular Necrosis of Hip and Phantom Limb Pain: Pain management per above.   DVT Prophylaxis: SCDs Code Status: Full Admit to: Step down  Dispo: Disposition is deferred at this time, awaiting improvement of current medical problems.  Anticipated discharge in approximately 2-3 day(s).   The patient does have a current PCP Iline Oven, MD) and does need an [facility] hospital follow-up appointment after discharge.  The patient does not have transportation limitations that hinder transportation to clinic appointments.  .Services Needed at time of discharge: Y = Yes, Blank = No PT:   OT:   RN:   Equipment:   Other:     LOS: 2 days   Dellia Nims, MD 09/12/2015, 8:14 AM

## 2015-09-12 NOTE — Progress Notes (Signed)
CRITICAL VALUE ALERT  Critical value received:  Potassium 2.3  Date of notification:  11/13  Time of notification:  0340  Critical value read back:Yes  Nurse who received alert:  Remo Lipps, RN  MD notified (1st page):  Melburn Hake  Time of first page:  [phone]  Order in place 0400

## 2015-09-13 ENCOUNTER — Encounter (HOSPITAL_COMMUNITY): Payer: Self-pay | Admitting: *Deleted

## 2015-09-13 DIAGNOSIS — R1084 Generalized abdominal pain: Secondary | ICD-10-CM

## 2015-09-13 DIAGNOSIS — K5669 Other intestinal obstruction: Secondary | ICD-10-CM

## 2015-09-13 DIAGNOSIS — E43 Unspecified severe protein-calorie malnutrition: Secondary | ICD-10-CM

## 2015-09-13 DIAGNOSIS — R7989 Other specified abnormal findings of blood chemistry: Secondary | ICD-10-CM

## 2015-09-13 DIAGNOSIS — L8990 Pressure ulcer of unspecified site, unspecified stage: Secondary | ICD-10-CM | POA: Insufficient documentation

## 2015-09-13 LAB — CBC WITH DIFFERENTIAL/PLATELET
BASOS ABS: 0 10*3/uL (ref 0.0–0.1)
Basophils Relative: 0 %
EOS ABS: 0.1 10*3/uL (ref 0.0–0.7)
EOS PCT: 0 %
HCT: 24.6 % — ABNORMAL LOW (ref 39.0–52.0)
Hemoglobin: 8.2 g/dL — ABNORMAL LOW (ref 13.0–17.0)
Lymphocytes Relative: 16 %
Lymphs Abs: 2.3 10*3/uL (ref 0.7–4.0)
MCH: 28.4 pg (ref 26.0–34.0)
MCHC: 33.3 g/dL (ref 30.0–36.0)
MCV: 85.1 fL (ref 78.0–100.0)
MONO ABS: 0.6 10*3/uL (ref 0.1–1.0)
Monocytes Relative: 4 %
Neutro Abs: 11.5 10*3/uL — ABNORMAL HIGH (ref 1.7–7.7)
Neutrophils Relative %: 80 %
PLATELETS: 217 10*3/uL (ref 150–400)
RBC: 2.89 MIL/uL — AB (ref 4.22–5.81)
RDW: 17.5 % — AB (ref 11.5–15.5)
WBC: 14.4 10*3/uL — AB (ref 4.0–10.5)

## 2015-09-13 LAB — BASIC METABOLIC PANEL
ANION GAP: 7 (ref 5–15)
BUN: 8 mg/dL (ref 6–20)
CHLORIDE: 126 mmol/L — AB (ref 101–111)
CO2: 15 mmol/L — AB (ref 22–32)
Calcium: 7.2 mg/dL — ABNORMAL LOW (ref 8.9–10.3)
Creatinine, Ser: 1.41 mg/dL — ABNORMAL HIGH (ref 0.61–1.24)
GFR calc non Af Amer: 55 mL/min — ABNORMAL LOW (ref >60)
Glucose, Bld: 132 mg/dL — ABNORMAL HIGH (ref 65–99)
POTASSIUM: 2.4 mmol/L — AB (ref 3.5–5.1)
SODIUM: 148 mmol/L — AB (ref 135–145)

## 2015-09-13 LAB — CREATININE, URINE, RANDOM: Creatinine, Urine: 32.36 mg/dL

## 2015-09-13 LAB — RENAL FUNCTION PANEL
ALBUMIN: 2 g/dL — AB (ref 3.5–5.0)
BUN: 11 mg/dL (ref 6–20)
CO2: 15 mmol/L — ABNORMAL LOW (ref 22–32)
CREATININE: 1.53 mg/dL — AB (ref 0.61–1.24)
Calcium: 7.1 mg/dL — ABNORMAL LOW (ref 8.9–10.3)
GFR calc Af Amer: 58 mL/min — ABNORMAL LOW (ref >60)
GFR calc non Af Amer: 50 mL/min — ABNORMAL LOW (ref >60)
GLUCOSE: 196 mg/dL — AB (ref 65–99)
POTASSIUM: 2.5 mmol/L — AB (ref 3.5–5.1)
Phosphorus: 1.8 mg/dL — ABNORMAL LOW (ref 2.5–4.6)
SODIUM: 152 mmol/L — AB (ref 135–145)

## 2015-09-13 LAB — PROTIME-INR
INR: 2.36 — ABNORMAL HIGH (ref 0.00–1.49)
PROTHROMBIN TIME: 25.5 s — AB (ref 11.6–15.2)

## 2015-09-13 LAB — OSMOLALITY, URINE: Osmolality, Ur: 507 mOsm/kg (ref 390–1090)

## 2015-09-13 LAB — SODIUM, URINE, RANDOM: Sodium, Ur: 88 mmol/L

## 2015-09-13 MED ORDER — POTASSIUM CHLORIDE 10 MEQ/50ML IV SOLN
10.0000 meq | INTRAVENOUS | Status: AC
  Administered 2015-09-13 (×6): 10 meq via INTRAVENOUS
  Filled 2015-09-13 (×6): qty 50

## 2015-09-13 MED ORDER — POTASSIUM CL IN DEXTROSE 5% 20 MEQ/L IV SOLN
20.0000 meq | INTRAVENOUS | Status: DC
Start: 2015-09-13 — End: 2015-09-14
  Administered 2015-09-13 – 2015-09-14 (×5): 20 meq via INTRAVENOUS
  Filled 2015-09-13 (×9): qty 1000

## 2015-09-13 MED ORDER — CHLORHEXIDINE GLUCONATE 0.12 % MT SOLN
15.0000 mL | Freq: Two times a day (BID) | OROMUCOSAL | Status: DC
  Administered 2015-09-13 – 2015-09-22 (×12): 15 mL via OROMUCOSAL
  Filled 2015-09-13 (×8): qty 15

## 2015-09-13 MED ORDER — MAGNESIUM SULFATE 2 GM/50ML IV SOLN
2.0000 g | Freq: Once | INTRAVENOUS | Status: AC
  Administered 2015-09-13: 2 g via INTRAVENOUS
  Filled 2015-09-13: qty 50

## 2015-09-13 MED ORDER — CETYLPYRIDINIUM CHLORIDE 0.05 % MT LIQD
7.0000 mL | Freq: Two times a day (BID) | OROMUCOSAL | Status: DC
  Administered 2015-09-13 – 2015-09-22 (×12): 7 mL via OROMUCOSAL

## 2015-09-13 MED ORDER — POTASSIUM CL IN DEXTROSE 5% 20 MEQ/L IV SOLN
20.0000 meq | INTRAVENOUS | Status: DC
  Administered 2015-09-13: 20 meq via INTRAVENOUS
  Filled 2015-09-13 (×3): qty 1000

## 2015-09-13 MED ORDER — POTASSIUM CHLORIDE 10 MEQ/50ML IV SOLN
10.0000 meq | INTRAVENOUS | Status: AC
  Administered 2015-09-13 – 2015-09-14 (×6): 10 meq via INTRAVENOUS
  Filled 2015-09-13 (×6): qty 50

## 2015-09-13 MED ORDER — MAGNESIUM SULFATE 2 GM/50ML IV SOLN: 2.0000 g | Freq: Once | INTRAVENOUS | Status: DC

## 2015-09-13 NOTE — Progress Notes (Signed)
Subjective: Patient was seen and examined at bedside today. Still having significant dark NGT output and complaining of some right sided abdominal pain. Reports having dark stool yesterday. Denies hemoptysis, n/v/diarrhea/fever/chills.    Objective: Vital signs in last 24 hours: Filed Vitals:   09/13/15 0400 09/13/15 0500 09/13/15 0730 09/13/15 1300  BP: 143/67  153/79 129/71  Pulse: 75  80 76  Temp: 98.9 F (37.2 C)  99.2 F (37.3 C) 99.8 F (37.7 C)  TempSrc: Oral  Oral Oral  Resp: _0 Height:      Weight:  101 lb 14.4 oz (46.222 kg)    SpO2: 98%  99% 97%   Weight change: -1 lb (-0.454 kg)  Intake/Output Summary (Last 24 hours) at 09/13/15 1416 Last data filed at 09/13/15 3825  Gross per 24 hour  Intake   4625 ml  Output   3425 ml  Net   1200 ml   Physical Exam  Constitutional: He is oriented to person, place, and time. No distress.  Cachectic   Cardiovascular: Normal rate, regular rhythm and intact distal pulses.  Exam reveals no gallop and no friction rub.   No murmur heard. Pulmonary/Chest: He has no wheezes.  Rhonchi   Abdominal: He exhibits no distension. There is tenderness. There is no rebound and no guarding.  Hypoactive bowel sounds. RUQ mildly TTP. Has dark NGT output.   Musculoskeletal:  R AKA stump well healed No LLE edema  Neurological: He is alert and oriented to person, place, and time.  Skin: Skin is warm and dry.   Lab Results: Basic Metabolic Panel:  Recent Labs Lab 09/12/15 0430 09/12/15 1729 09/13/15 0348  NA  --  155* 152*  K  --  2.5* 2.5*  CL  --  >130* >130*  CO2  --  14* 15*  GLUCOSE  --  128* 196*  BUN  --  15 11  CREATININE  --  1.56* 1.53*  CALCIUM  --  7.6* 7.1*  MG 1.5*  --   --   PHOS  --   --  1.8*   Liver Function Tests:  Recent Labs Lab 09/11/15 0616 09/12/15 0256 09/13/15 0348  AST 56* 49*  --   ALT 33 28  --   ALKPHOS 78 71  --   BILITOT 1.3* 1.4*  --   PROT 4.8* 4.7*  --   ALBUMIN 2.2* 2.2*  2.0*   CBC:  Recent Labs Lab 09/12/15 1729 09/13/15 0347  WBC 15.6* 14.4*  NEUTROABS 12.5* 11.5*  HGB 8.6* 8.2*  HCT 25.4* 24.6*  MCV 84.1 85.1  PLT 207 217   Coagulation:  Recent Labs Lab 09/10/15 2022 09/12/15 0256 09/13/15 0347  LABPROT 21.1* 24.8* 25.5*  INR 1.83* 2.26* 2.36*   Urine Drug Screen: Drugs of Abuse     Component Value Date/Time   LABOPIA * 06/23/2009 1831    POSITIVE (NOTE) Result repeated and verified. Sent for confirmatory testing   COCAINSCRNUR NEGATIVE 06/23/2009 1831   LABBENZ NEGATIVE 06/23/2009 1831   AMPHETMU NEGATIVE 06/23/2009 1831    Micro Results: Recent Results (from the past 240 hour(s))  MRSA PCR Screening     Status: None   Collection Time: 09/10/15  6:23 PM  Result Value Ref Range Status   MRSA by PCR NEGATIVE NEGATIVE Final    Comment:        The GeneXpert MRSA Assay (FDA approved for NASAL specimens only), is one component of a comprehensive MRSA  colonization surveillance program. It is not intended to diagnose MRSA infection nor to guide or monitor treatment for MRSA infections.   Culture, blood (routine x 2)     Status: None (Preliminary result)   Collection Time: 09/10/15 11:25 PM  Result Value Ref Range Status   Specimen Description BLOOD LEFT ANTECUBITAL  Final   Special Requests BOTTLES DRAWN AEROBIC AND ANAEROBIC 10CC   Final   Culture NO GROWTH 3 DAYS  Final   Report Status PENDING  Incomplete  Culture, blood (routine x 2)     Status: None (Preliminary result)   Collection Time: 09/10/15 11:30 PM  Result Value Ref Range Status   Specimen Description BLOOD RIGHT ARM  Final   Special Requests BOTTLES DRAWN AEROBIC AND ANAEROBIC 5CC  Final   Culture NO GROWTH 2 DAYS  Final   Report Status PENDING  Incomplete   Studies/Results: No results found. Medications: I have reviewed the patient's current medications. Scheduled Meds: . ampicillin-sulbactam (UNASYN) IV  3 g Intravenous Q8H  . antiseptic oral rinse   7 mL Mouth Rinse q12n4p  . chlorhexidine  15 mL Mouth Rinse BID  . pantoprazole (PROTONIX) IV  40 mg Intravenous Q24H   Continuous Infusions: . dextrose 5 % with KCl 20 mEq / L 20 mEq (09/13/15 1130)  .  sodium bicarbonate  infusion 1000 mL 75 mL/hr at 09/13/15 0650   PRN Meds:.HYDROmorphone (DILAUDID) injection Assessment/Plan: Active Problems:   Human immunodeficiency virus (HIV) disease (HCC)   Alcohol abuse   Status post above knee amputation ([place])   Long term current use of anticoagulant   Avascular necrosis of femoral head (HCC)   Septic shock (HCC)   Gram-negative bacteremia (HCC)   Abdominal pain   Pneumonia due to Klebsiella pneumoniae (HCC)   Pressure ulcer  Septic shock 2/2 to Klebsiella pneumoniae Bacteremia: likely from GI (aspiratin) vs lung improving. Was on pressors at [facility]. Imaging shows bilateral pneumonia has improved since 11/8. Afebrile currently. Blood culture showing no growth in 3 days. Patient is tachypnic (RR up to 31) but satting 96-100% on RA. Pulse and BP stable.  - Final blood culture pending - Unasyn 3g q8h IV for coverage of Klebsiella and possibly anaerobes  Partial SBO: Abdominal X-ray showing partial SBO, improved since 11/8. The patient takes scheduled MS contin at home 60 mg x 12h with oxycodone IR 5 mg for breakthrough, which may have caused his SBO. In addition, iron released from recent GI bleed could have also caused constipation. Gallstone ileus is also on the differential.  Has hypoactive BS, continues to have dark fluid from NG tube (1.2L in the past 24 hrs).   - Continue NG suction - Hydromorphone 1 mg q4h prn - D5 fluid per nephro - NPO  Hematemesis: Unclear source of upper GI bleed. Hgb currently 8.2, with baseline over 13. Likely in the setting of elevated INR during previous hospitalization. INR 2.36 now. Dark fluid from NG tube (1.2L in the past 24 hrs) and patient reports having dark stool yesterday. GI has been  consulted for further evaluation.  - Protonix IV 40 mg - trend cbc - Appreciate GI recs  GB dysfunction: cholestasis, EF 27% per HIDA.  - GI has been consulted, appreciate recs  High INR without warfarin - with History of Unprovoked DVT:  On anticoagulation indefinitely. Patient has consistently been supratherapeutic at anti-coag visits and presented to 10 at [facility].  - Holding warfarin but patient is auto-anticoagulation likely 2/2 to  sepsis causing synthetic dysfunction of liver.  - cont monitoring cbc and INR  Acute on Chronic Kidney Disease - likely 2/2 to ATN: Baseline creatinine of ~1.2. Was in 7's at outside hospital, now improving to 1.53. Has good UOP.  - continue to monitor. Cont fluids.   Non gap Metabolic Acidosis: AG is resolved as no longer has lactic acidosis but persistently has low bicarb (15 today).  - this is likely 2/2 to Normal Saline causing volume expansion. Don't think this is 2/2 to NG tube suction as that would cause met alkalosis.  - appreciate nephro recs. Continue IV bicarb.  Hypokalemia and hypoMag - K 2.5 this AM.  -IV KCl 10 meq q1 x6 + replete Mag  Hypernatremia - likely 2/2 to free water deficit Received Normal Saline extensively before transferring to [place]. Na 152 today.  -Continue D5 to replete free water deficit and also to help with hypernatremia.   HIV: CD4 decreased from 700 to 219 in a matter of 2 weeks, likely in the setting of acute illness. Viral load <20 in 07/2015.    - Restart anti-retroviral regimen after NPO status -F/u HLA B-57 (01)  Elevated AST: To 62. Likely downtrending from previous septic shock, but could represent underlying liver dysfunction driving up his INR. - F/u repeat CMP  Stage II pressure ulcer -Wound care consulted   Avascular Necrosis of Hip and Phantom Limb Pain: Pain management per above.   DVT Prophylaxis: SCDs Code Status: Full Admit to: Step down  Dispo: Disposition is deferred at this time, awaiting  improvement of current medical problems.  Anticipated discharge in approximately 2-3 day(s).   The patient does have a current PCP Iline Oven, MD) and does need an [facility] hospital follow-up appointment after discharge.  The patient does not have transportation limitations that hinder transportation to clinic appointments.  .Services Needed at time of discharge: Y = Yes, Blank = No PT:   OT:   RN:   Equipment:   Other:     LOS: 3 days   Shela Leff, MD 09/13/2015, 2:16 PM

## 2015-09-13 NOTE — Progress Notes (Signed)
Initial Nutrition Assessment  DOCUMENTATION CODES:   Severe malnutrition in context of chronic illness, Underweight  INTERVENTION:   Advance diet as medically appropriate, add supplements accordingly   NUTRITION DIAGNOSIS:   Inadequate oral intake related to altered GI function as evidenced by NPO status  GOAL:   Patient will meet greater than or equal to 90% of their needs  MONITOR:   Diet advancement, PO intake, Labs, Weight trends, I & O's  REASON FOR ASSESSMENT:   Low Braden  ASSESSMENT:   54 yo M with hx of HIV+ (ATVc/TFV/DTGV), R AKA due to DVT, CKD2, bilateral femoral head AVN; Admitted in transfer from [facility] where he was admitted on 11-7 dark stools and hematemesis. He was found to be acidotic, hyponatremic, and required intubation. He was found to have bilateral pneumonia (possible aspiration), SBO as well as INR > 10. He was transfused both blood and FFP. He was started on vanco/imipenem/levaquin. His Bcx were positive for klebsiella.  RD unable to obtain nutrition hx.  Pt not able to answer interview questions.  Pt with NGT to LIS in place.  + abdominal pain.  X-ray showed partial SBO.  Increased nutrient needs given catabolic/chronic illness.  Low braden score places patient at risk for further skin breakdown.  Nutrition-Focused physical exam completed. Findings are severe fat depletion, severe muscle depletion, and no edema.   Diet Order:  Diet NPO time specified  Skin:  Wound (see comment) (Stage II to coccyx)  Last BM:  11/13  Height:   Ht Readings from Last 1 Encounters:  09/10/15 6' (1.829 m)    Weight:   Wt Readings from Last 1 Encounters:  09/13/15 101 lb 14.4 oz (46.222 kg)    Ideal Body Weight:  81 kg  BMI:  Body mass index is 13.82 kg/(m^2).  Estimated Nutritional Needs:   Kcal:  1600-1800  Protein:  80-90 gm  Fluid:  1.6-1.8 L  EDUCATION NEEDS:   No education needs identified at this time  Arthur Holms, RD,  LDN Pager #: [phone] After-Hours Pager #: [phone]

## 2015-09-13 NOTE — Care Management Important Message (Signed)
Important Message  Patient Details  Name: [name] MRN: [id number] Date of Birth: [birth year]   Medicare Important Message Given:  Yes    Joniece Smotherman P Lisel Siegrist 09/13/2015, 1:25 PM

## 2015-09-13 NOTE — Consult Note (Signed)
WOC wound consult note Reason for Consult: Consult requested for sacrum wound.  Pt is very emaciated with multiple systemic factors which can impair healing. Wound type: Stage 2 Pressure Ulcer POA: No Measurement:.3X.3X.1cm Wound bed: pink and dry Drainage (amount, consistency, odor) no odor or drainage Periwound: Intact skin surrounding, sacral bone protrudes Dressing procedure/placement/frequency: Foam dressing to protect and promote healing. Discussed plan of care with patient and he verbalizes understanding. Please re-consult if further assistance is needed.  Thank-you,  Julien Girt MSN, [place], [place], [place], [place]

## 2015-09-13 NOTE — Progress Notes (Signed)
CRITICAL VALUE ALERT  Critical value received:  K 2.5, Chloride > 130.   Date of notification:  09/13/2015   Time of notification:  5:13 AM   Critical value read back:Yes.    Nurse who received alert:  Monico Hoar, RN   MD notified (1st page):  Internal Medicine   Time of first page:  5:13 AM     Responding MD:  MD ON call for IMTS   Time MD responded:  5:13 AM

## 2015-09-13 NOTE — Consult Note (Signed)
[facility] Consult: 2:15 PM 09/13/2015  LOS: 3 days    Referring Provider: dr Dareen Piano Primary Care Physician:  Sherin Quarry, MD  resident Primary Gastroenterologist: unassigned     Reason for Consultation: 1. Blood in ngt, 2.  Low gallbladder ef   HPI: [name] is a 54 y.o. male.  On Coumadin for hx DVT which led to right AKA 2010.  HIV since at least 2001., on antiretrovirals and undetectable viral load, CD4 700. CKD.  Anemia.  AVN of bil femoral heads.  Alcoholism in past, however he says he hasn't drunk any alcohol in 15 years.  Chart mentions hepatitis C positive but I am reviewing serologies from 05/2009 and 09/09/2015 hepatitis A, B, and C serologies negative at that time.  Admission to [facility] for abd pain and electrolyte issues 05/2015.  CT showed prostatitis, fatty liver.   Pt claims he had egd and colonoscopy after 05/2015 admission and was told his insides were messed up.  Michela Pitcher it was done at [facility] but there is nothing in [place].   Ad[facility] 09/06/[facility] (but later asked to transfer to [facility] 09/10/15) with MOSF, shock, met acidosis, elevated INR > 10, GIB. ARF with electrolyte abnormality, encephalopathy.  INR supratherapeutic, dark stools and hematemesis, SBO on imaging, septic picture. Intubated.  Klebsiella bacteremia.  Transfused FFP x 3, PRBC x 3. Intubated 11/7 - 11/11.  Elevated LFTs (t bili 1.6, alk phos 140, AST/ALT 99/30) , acute on chronic right abdominal pain 11/8 ultrasound: sludge, thick GB wall, fatty liver vs hepatocellular disease. Mild ascites.  11/8 CT without contrast. Severe bil pna, no change in PSBO, new thickened segment of jejunum and progression in thickening in cecum and prox colon, ascites , adrenal mass (not new).  "Many of the findings above  such as the new bilateral pleural effusions, new ascites, and new bowel wall thickening raise the question of hyperproteinemia or fluid overload"  11/10 [facility] Hida scan: patent cystic duct, cholestasis and biliary dyskinesia, GB EF 27% (norm is > 40)_  NGT in place and showing darker, biliary type emesis and nurse reports that there is never been any visible blood in the NG aspirate.  Output volume 1200 cc yesterday, 600 mL within the last 9 hours.  Abdominal pain is better.  No melena.  Hemoglobin baseline is about 13.5 to 14. It is stable between 8.2 and 8.5 since arrival. WBCs are improving but still elevated. Remains significantly hypo-klemic at 2.5. Sodium still elevated at 152. On arrival he had AST 62, ALT of 36. These have improved to 49 and 28. Alkaline phosphatase normal in the 70s. Her bilirubin has dropped from 2.1 to 1.4.   Past Medical History  Diagnosis Date  . History of chicken pox   . Hyperlipidemia   . History of DVT of lower extremity     right  . Left hip pain 03/09/2013  . Preventative health care 03/09/2013  . Pneumonia   . Chronic kidney disease     Past Surgical History  Procedure Laterality Date  .  Appendectomy  1962  . Leg amputation above knee  2010    right leg    Prior to Admission medications   Medication Sig Start Date End Date Taking? Authorizing Provider  aspirin 81 MG tablet Take 81 mg by mouth daily.     Yes Historical Provider, MD  ENSURE (ENSURE) Take 1 Can by mouth 3 (three) times daily between meals. 08/09/15  Yes Michel Bickers, MD  fenofibrate (TRICOR) 145 MG tablet Take 1 tablet (145 mg total) by mouth daily. 08/06/15  Yes Michel Bickers, MD  gabapentin (NEURONTIN) 400 MG capsule TAKE ONE CAPSULE BY MOUTH FOUR TIMES DAILY 05/04/15  Yes Michel Bickers, MD  morphine (MS CONTIN) 60 MG 12 hr tablet Take 2 tablets (120 mg total) by mouth every 12 (twelve) hours. 08/18/15  Yes Campbell Riches, MD  oxyCODONE (OXY IR/ROXICODONE) 5 MG immediate release  tablet Take 1 tablet (5 mg total) by mouth every 6 (six) hours as needed for severe pain. 08/18/15  Yes Campbell Riches, MD  potassium chloride SA (K-DUR,KLOR-CON) 20 MEQ tablet TAKE 1 TABLET BY MOUTH EVERY DAY 07/01/15  Yes Iline Oven, MD  [name] 50 MG tablet TAKE 1 TABLET (50 MG TOTAL) BY MOUTH DAILY. 05/13/15  Yes Michel Bickers, MD  [name] 300 MG tablet TAKE 1 TABLET (300 MG TOTAL) BY MOUTH DAILY. 05/13/15  Yes Michel Bickers, MD  warfarin (COUMADIN) 5 MG tablet Take 2.5-5 mg by mouth daily. On Sunday Monday Wednesday Friday patient takes 2.5 mg and on all other days patient takes 5 mg.   Yes Historical Provider, MD  EVOTAZ 300-150 MG per tablet TAKE 1 TABLET BY MOUTH DAILY. SWALLOW WHOLE. DO NOT CRUSH, CUT OR CHEW TABLET. TAKE WITH FOOD. 06/08/15   Michel Bickers, MD    Scheduled Meds: . ampicillin-sulbactam (UNASYN) IV  3 g Intravenous Q8H  . antiseptic oral rinse  7 mL Mouth Rinse q12n4p  . chlorhexidine  15 mL Mouth Rinse BID  . pantoprazole (PROTONIX) IV  40 mg Intravenous Q24H   Infusions: . dextrose 5 % with KCl 20 mEq / L 20 mEq (09/13/15 1130)  .  sodium bicarbonate  infusion 1000 mL 75 mL/hr at 09/13/15 0650   PRN Meds: HYDROmorphone (DILAUDID) injection   Allergies as of 09/10/2015 - Review Complete 09/10/2015  Allergen Reaction Noted  . Dapsone    . Sulfamethoxazole-trimethoprim      Family History  Problem Relation Age of Onset  . Arthritis Mother   . Kidney disease Maternal Grandfather     Social History   Social History  . Marital Status: Married    Spouse Name: N/A  . Number of Children: 3  . Years of Education: N/A   Occupational History  .     Social History Main Topics  . Smoking status: Current Every Day Smoker -- 0.50 packs/day for 36 years    Types: Cigarettes  . Smokeless tobacco: Never Used     Comment: ABOUT 1/2PPD.  Slowing down  . Alcohol Use: No  . Drug Use: No  . Sexual Activity: Not Currently     Comment: declined condoms    Other Topics Concern  . Not on file   Social History Narrative    REVIEW OF SYSTEMS: Constitutional:  + weight loss.  + weakness ENT:  No nose bleeds Pulm:  No SOB or cough CV:  No palpitations, no LE edema.  GU:  No hematuria, no frequency GI:  Per HPI Heme:  Per HPI  Transfusions:  Per HPI Neuro:  No headaches, no peripheral tingling or numbness Derm:  No itching, no rash or sores.  Endocrine:  No sweats or chills.  No polyuria or dysuria Immunization:  Not queried Travel:  None beyond local counties in last few months.    PHYSICAL EXAM: Vital signs in last 24 hours: Filed Vitals:   09/13/15 1300  BP: 129/71  Pulse: 76  Temp: 99.8 F (37.7 C)  Resp: 31   Wt Readings from Last 3 Encounters:  09/13/15 101 lb 14.4 oz (46.222 kg)  08/06/15 118 lb 3.2 oz (53.615 kg)  06/14/15 124 lb 12.8 oz (56.609 kg)    General: chrnonically ill.  Poor historian Head:  No swelling or asymmetry  Eyes:  No icterus or pallor  Ears:  No obvious hearing loss  Nose:  No discharge  Mouth:  Moist, clear oral MM. No teeth. Lips look swollen. Neck:  No mass, no JVD. Lungs:  Diminished but clear bilaterally. Heart: Sinus rhythm in the 90s, accelerates into the low 100s at times. Abdomen:  Soft. Not tender. Not distended. Bowel sounds active. Coffee-colored, bilious emesis. This is not coffee grounds or dark hematemesis..   Rectal: Deferred   Musc/Skeltl: No joint swelling or contractures. Extremities:  Status post right AKA.  Neurologic:  Although oriented 3, the patient is not a good historian. He can move all 4 limbs. No obvious tremors. No asterixis. Skin:  No sores or rashes. Tattoos:  None seen Nodes:  No inguinal or cervical adenopathy.   Psych:  Affect is bland. Calm.    Intake/Output from previous day: 11/13 0701 - 11/14 0700 In: 5500 [I.V.:5200; IV Piggyback:300] Out: 4075 [Urine:2875; Emesis/NG output:1200] Intake/Output this shift: Total I/O In: 325 [I.V.:275; IV  Piggyback:50] Out: -   LAB RESULTS:  Recent Labs  09/12/15 0256 09/12/15 1729 09/13/15 0347  WBC 15.9* 15.6* 14.4*  HGB 8.4* 8.6* 8.2*  HCT 24.7* 25.4* 24.6*  PLT 199 207 217   BMET Lab Results  Component Value Date   NA 152* 09/13/2015   NA 155* 09/12/2015   NA 151* 09/12/2015   K 2.5* 09/13/2015   K 2.5* 09/12/2015   K 2.3* 09/12/2015   CL >130* 09/13/2015   CL >130* 09/12/2015   CL 130* 09/12/2015   CO2 15* 09/13/2015   CO2 14* 09/12/2015   CO2 15* 09/12/2015   GLUCOSE 196* 09/13/2015   GLUCOSE 128* 09/12/2015   GLUCOSE 143* 09/12/2015   BUN 11 09/13/2015   BUN 15 09/12/2015   BUN 18 09/12/2015   CREATININE 1.53* 09/13/2015   CREATININE 1.56* 09/12/2015   CREATININE 1.75* 09/12/2015   CALCIUM 7.1* 09/13/2015   CALCIUM 7.6* 09/12/2015   CALCIUM 7.3* 09/12/2015   LFT  Recent Labs  09/10/15 2022 09/11/15 0616 09/12/15 0256 09/13/15 0348  PROT 4.9* 4.8* 4.7*  --   ALBUMIN 2.3* 2.2* 2.2* 2.0*  AST 62* 56* 49*  --   ALT 36 33 28  --   ALKPHOS 78 78 71  --   BILITOT 2.1* 1.3* 1.4*  --    PT/INR Lab Results  Component Value Date   INR 2.36* 09/13/2015   INR 2.26* 09/12/2015   INR 1.83* 09/10/2015   PROTIME 38.4* 04/01/2012   PROTIME 39.6* 03/24/2010   Hepatitis Panel No results for input(s): HEPBSAG, HCVAB, HEPAIGM, HEPBIGM in the last 72 hours. C-Diff No components found for: CDIFF Lipase  No results found for: LIPASE  Drugs of Abuse  Component Value Date/Time   LABOPIA * 06/23/2009 1831    POSITIVE (NOTE) Result repeated and verified. Sent for confirmatory testing   COCAINSCRNUR NEGATIVE 06/23/2009 1831   LABBENZ NEGATIVE 06/23/2009 1831   AMPHETMU NEGATIVE 06/23/2009 1831     RADIOLOGY STUDIES: No results found.  ENDOSCOPIC STUDIES: Unable to locate the reported EGD and colonoscopy which she says was done in late summer 2016 in [place]:   *  "Bloody" NG tube output. To me and the attending  RN this is dark bilious emesis.  Stool on 09/06/15 was FOBT positive.  Unable to locate the patient reported EGD and colonoscopy from [place].  Last x-rays were obtained 2 days ago showing improving but persistent PSBO.  CT 6 days ago showed jejunal, cecal and proximal colonic thickening. Although not typical location for ischemia, given his extensive peripheral vascular disease and atrial fibrillation., ischemic insult to the intestine is certainly in the differential.    *  Elevated LFTs, mild.  Chronic right abd pain.  GB sludge, fatty liver vs hepatocellular disease.  Low GB EF on HIDA. Not convinced of cholecystitis.  Does he need gen surg opinion?  LFTs normalizing.  Max:   *  Normocytic anemia, stable.  *  Bilateral pneumonia, question aspiration. Blood cultures positive for Klebsiella.  *   HIV.   PLAN:     *  In the morning will order another set of abdominal films to gauge status of PSBO.  Leave NG tube in place for now.  Decisions regarding endoscopic procedures per Dr Silverio Decamp.   *  Note the patient does not want his family knowing of his HIV status.   Azucena Freed  09/13/2015, 2:15 PM Pager: [phone]

## 2015-09-13 NOTE — Progress Notes (Signed)
Potassium 2.4. MD notified and new order placed

## 2015-09-13 NOTE — Evaluation (Signed)
Physical Therapy Evaluation Patient Details Name: [name] MRN: [id number] DOB: [birth year] Today's Date: 09/13/2015   History of Present Illness  54 yo M with hx of HIV+, R AKA due to DVT, CKD2, bilateral femoral head AVN.   Admitted in transfer from [facility] where he was admitted on 11-7 dark stools and hematemesis. He was found to be acidotic, hyponatremic, and required intubation. He was found to have bilateral pneumonia (possible aspiration), SBO as well as INR > 10.    Clinical Impression  Patient presents with decreased independence with mobility due to deficits listed in PT problem list.  He will benefit from skilled PT in the acute setting to allow return home with family assist following CIR level rehab stay.    Follow Up Recommendations CIR    Equipment Recommendations  None recommended by PT    Recommendations for Other Services       Precautions / Restrictions Precautions Precautions: Fall Precaution Comments: NG tube      Mobility  Bed Mobility Overal bed mobility: Needs Assistance Bed Mobility: Supine to Sit     Supine to sit: Mod assist     General bed mobility comments: increased time, cues for using hands to push to scoot to edge of bed, lifting assist for trunk  Transfers Overall transfer level: Needs assistance Equipment used: Rolling walker (2 wheeled) Transfers: Sit to/from [facility] Sit to Stand: Mod assist Stand pivot transfers: Min assist       General transfer comment: cues for hand placement for safety, lifting and lowering assist for sit<>stand and pt able to hop to chair with walker and assist  Ambulation/Gait                Stairs            Wheelchair Mobility    Modified Rankin (Stroke Patients Only)       Balance Overall balance assessment: Needs assistance   Sitting balance-Leahy Scale: Fair     Standing balance support: Bilateral upper extremity supported Standing  balance-Leahy Scale: Poor Standing balance comment: R AKA                             Pertinent Vitals/Pain Pain Assessment: Faces Faces Pain Scale: Hurts little more Pain Location: buttocks Pain Descriptors / Indicators: Sore Pain Intervention(s): Monitored during session;Repositioned;Other (comment) (pillows placed in chair)    Home Living Family/patient expects to be discharged to:: Private residence Living Arrangements: Children (daughter) Available Help at Discharge: Family;Available 24 hours/day Type of Home: House Home Access: Stairs to enter;Ramped entrance   Entrance Stairs-Number of Steps: did not state Home Layout: One level Home Equipment: Walker - 2 wheels;Wheelchair - manual;Bedside commode Additional Comments: prosthesis    Prior Function Level of Independence: Independent with assistive device(s)               Hand Dominance        Extremity/Trunk Assessment   Upper Extremity Assessment: Generalized weakness (R AKA)           Lower Extremity Assessment: Generalized weakness         Communication   Communication: No difficulties  Cognition Arousal/Alertness: Awake/alert Behavior During Therapy: WFL for tasks assessed/performed Overall Cognitive Status: Within Functional Limits for tasks assessed                      General Comments  Exercises        Assessment/Plan    PT Assessment Patient needs continued PT services  PT Diagnosis Generalized weakness;Difficulty walking   PT Problem List Decreased strength;Decreased activity tolerance;Decreased mobility;Decreased balance  PT Treatment Interventions DME instruction;Functional mobility training;Balance training;Gait training;Therapeutic exercise;Wheelchair mobility training   PT Goals (Current goals can be found in the Care Plan section) Acute Rehab PT Goals Patient Stated Goal: To get stronger PT Goal Formulation: With patient Time For Goal Achievement:  09/27/15 Potential to Achieve Goals: Good    Frequency Min 2X/week   Barriers to discharge   pt reports he doesn't think his daughter could provide enough assist at home even though she doesn't work    Co-evaluation               End of [place] During Treatment: Gait belt Activity Tolerance: Patient limited by fatigue Patient left: in chair Nurse Communication: Mobility status         Time: [id number] PT Time Calculation (min) (ACUTE ONLY): 28 min   Charges:   PT Evaluation $Initial PT Evaluation Tier I: 1 Procedure PT Treatments $Therapeutic Activity: 8-22 mins   PT G Codes:        [name],CYNDI 09/17/15, 4:26 PM  [name], [place] Sep 17, 2015

## 2015-09-13 NOTE — Progress Notes (Signed)
Utilization Review Completed.  

## 2015-09-13 NOTE — Progress Notes (Signed)
Patient seen and examined. Case  D/w residents in detail. I agree with findings and plan as documented in Dr. Elon Jester note.  Patient remains NPO with large amount of dark drainage from NGT. Patient s/p dark BM yesterday. He was diagnosed with SBO on 11/8 and repeat x ray on 11/11 with persistent signs of SBO but decreased bowel distension. C/w pain control and IV hydration. Would consider TPN if continues to remain NPO. GI consulted.  Also noted to have non gap metabolic acidosis possibly from AKI. Will monitor closely. Also noted to be hypernatramic. C/w D5W for now. Replete lytes.   Patient also with PNA - would c/w unasyn for klebisella PNA. Blood cx with NGTD. Will monitor.

## 2015-09-13 NOTE — Progress Notes (Signed)
[facility] ROUNDING NOTE   Subjective:   Interval History: no new developments appears ill and cachectic    Objective:  Vital signs in last 24 hours:  Temp:  [98.9 F (37.2 C)-99.8 F (37.7 C)] 99.2 F (37.3 C) (11/14 0730) Pulse Rate:  [73-82] 80 (11/14 0730) Resp:  [15-29] 24 (11/14 0730) BP: (131-153)/(65-79) 153/79 mmHg (11/14 0730) SpO2:  [96 %-100 %] 99 % (11/14 0730) Weight:  [46.222 kg (101 lb 14.4 oz)] 46.222 kg (101 lb 14.4 oz) (11/14 0500)  Weight change: -0.454 kg (-1 lb) Filed Weights   09/11/15 0500 09/12/15 0453 09/13/15 0500  Weight: 45.587 kg (100 lb 8 oz) 46.675 kg (102 lb 14.4 oz) 46.222 kg (101 lb 14.4 oz)    Intake/Output: I/O last 3 completed shifts: In: 7400 [I.V.:7000; IV Piggyback:400] Out: [id number] [Urine:4225; Emesis/NG output:1600]   Intake/Output this shift:  Total I/O In: 325 [I.V.:275; IV Piggyback:50] Out: -    General: Very thin,  cachectic HEENT: mucous membranes dry   NGT --- suction  Neck: There is no JVD Heart: Regular rate and rhythm Lungs: Clear to auscultation bilaterally Abdomen: Decreased breath sounds, tender to palpation Extremities: No edema Neuro: Alert and nonfocal- some difficulty in understanding his speech  Basic Metabolic Panel:  Recent Labs Lab 09/10/15 2022 09/11/15 0616 09/12/15 0256 09/12/15 0430 09/12/15 1729 09/13/15 0348  NA 151* 151* 151*  --  155* 152*  K 3.4* 2.7* 2.3*  --  2.5* 2.5*  CL 129* >130* 130*  --  >130* >130*  CO2 16* 13* 15*  --  14* 15*  GLUCOSE 116* 110* 143*  --  128* 196*  BUN 18 17 18   --  15 11  CREATININE 1.87* 1.86* 1.75*  --  1.56* 1.53*  CALCIUM 7.4* 7.3* 7.3*  --  7.6* 7.1*  MG  --   --   --  1.5*  --   --   PHOS  --   --   --   --   --  1.8*    Liver Function Tests:  Recent Labs Lab 09/10/15 2022 09/11/15 0616 09/12/15 0256 09/13/15 0348  AST 62* 56* 49*  --   ALT 36 33 28  --   ALKPHOS 78 78 71  --   BILITOT 2.1* 1.3* 1.4*  --   PROT 4.9*  4.8* 4.7*  --   ALBUMIN 2.3* 2.2* 2.2* 2.0*   No results for input(s): LIPASE, AMYLASE in the last 168 hours. No results for input(s): AMMONIA in the last 168 hours.  CBC:  Recent Labs Lab 09/11/15 0616 09/11/15 1525 09/12/15 0256 09/12/15 1729 09/13/15 0347  WBC 16.6* 18.2* 15.9* 15.6* 14.4*  NEUTROABS 13.7* 15.0* 13.0* 12.5* 11.5*  HGB 8.2* 8.5* 8.4* 8.6* 8.2*  HCT 23.9* 24.5* 24.7* 25.4* 24.6*  MCV 81.8 84.2 83.4 84.1 85.1  PLT 194 191 199 207 217    Cardiac Enzymes: No results for input(s): CKTOTAL, CKMB, CKMBINDEX, TROPONINI in the last 168 hours.  BNP: Invalid input(s): POCBNP  CBG: No results for input(s): GLUCAP in the last 168 hours.  Microbiology: Results for orders placed or performed during the hospital encounter of 09/10/15  MRSA PCR Screening     Status: None   Collection Time: 09/10/15  6:23 PM  Result Value Ref Range Status   MRSA by PCR NEGATIVE NEGATIVE Final    Comment:        The GeneXpert MRSA Assay (FDA approved for NASAL specimens only), is one  component of a comprehensive MRSA colonization surveillance program. It is not intended to diagnose MRSA infection nor to guide or monitor treatment for MRSA infections.   Culture, blood (routine x 2)     Status: None (Preliminary result)   Collection Time: 09/10/15 11:25 PM  Result Value Ref Range Status   Specimen Description BLOOD LEFT ANTECUBITAL  Final   Special Requests BOTTLES DRAWN AEROBIC AND ANAEROBIC 10CC   Final   Culture NO GROWTH 2 DAYS  Final   Report Status PENDING  Incomplete  Culture, blood (routine x 2)     Status: None (Preliminary result)   Collection Time: 09/10/15 11:30 PM  Result Value Ref Range Status   Specimen Description BLOOD RIGHT ARM  Final   Special Requests BOTTLES DRAWN AEROBIC AND ANAEROBIC 5CC  Final   Culture NO GROWTH 1 DAY  Final   Report Status PENDING  Incomplete    Coagulation Studies:  Recent Labs  09/10/15 2022 09/12/15 0256 09/13/15 0347   LABPROT 21.1* 24.8* 25.5*  INR 1.83* 2.26* 2.36*    Urinalysis: No results for input(s): COLORURINE, LABSPEC, PHURINE, GLUCOSEU, HGBUR, BILIRUBINUR, KETONESUR, PROTEINUR, UROBILINOGEN, NITRITE, LEUKOCYTESUR in the last 72 hours.  Invalid input(s): APPERANCEUR    Imaging: No results found.   Medications:   . dextrose 5 % with KCl 20 mEq / L 20 mEq (09/13/15 1130)  .  sodium bicarbonate  infusion 1000 mL 75 mL/hr at 09/13/15 0650   . ampicillin-sulbactam (UNASYN) IV  3 g Intravenous Q8H  . pantoprazole (PROTONIX) IV  40 mg Intravenous Q24H  . potassium chloride  10 mEq Intravenous Q1 Hr x 6   HYDROmorphone (DILAUDID) injection  Assessment/ Plan:  1.Renal- acute renal failure likely due to previous sepsis and likely volume depletion. Renal function is better than it was previously. Does not appear that he required any dialysis therapy. Is making good urine. Renal function is approaching normal for him. 2. Metabolic acidosis - is non-gap.   We will supplement with IV bicarbonate. Puzzling probably due to renal failure 3. Hypernatremia and hyperchloremia- likely secondary to a lot of normal saline given at the other hospital. He has a significant free water deficit and will require water for ongoing diuresis. It may be very hard to make a diagnosis of Diabetes insipidus with acute kidney  Injury and tubular dysfuncton 4. Hypokalemia- agree with repletion 5. Anemia - significant in nature. Possibly some due to HIV and recent significant hospitalization as well as acute kidney injury. Supportive care for now      LOS: 3 Raquell Richer W @TODAY @12 :29 PM

## 2015-09-14 ENCOUNTER — Inpatient Hospital Stay (HOSPITAL_COMMUNITY): Payer: Medicare Other

## 2015-09-14 LAB — RENAL FUNCTION PANEL
Albumin: 2 g/dL — ABNORMAL LOW (ref 3.5–5.0)
Anion gap: 5 (ref 5–15)
BUN: 5 mg/dL — AB (ref 6–20)
CHLORIDE: 124 mmol/L — AB (ref 101–111)
CO2: 14 mmol/L — AB (ref 22–32)
CREATININE: 1.24 mg/dL (ref 0.61–1.24)
Calcium: 7.1 mg/dL — ABNORMAL LOW (ref 8.9–10.3)
GFR calc Af Amer: >60 mL/min (ref >60)
Glucose, Bld: 128 mg/dL — ABNORMAL HIGH (ref 65–99)
POTASSIUM: 2.5 mmol/L — AB (ref 3.5–5.1)
Phosphorus: 1.4 mg/dL — ABNORMAL LOW (ref 2.5–4.6)
Sodium: 143 mmol/L (ref 135–145)

## 2015-09-14 LAB — CBC
HEMATOCRIT: 26.4 % — AB (ref 39.0–52.0)
HEMOGLOBIN: 8.9 g/dL — AB (ref 13.0–17.0)
MCH: 28.6 pg (ref 26.0–34.0)
MCHC: 33.7 g/dL (ref 30.0–36.0)
MCV: 84.9 fL (ref 78.0–100.0)
Platelets: 228 10*3/uL (ref 150–400)
RBC: 3.11 MIL/uL — ABNORMAL LOW (ref 4.22–5.81)
RDW: 17.6 % — ABNORMAL HIGH (ref 11.5–15.5)
WBC: 16.9 10*3/uL — AB (ref 4.0–10.5)

## 2015-09-14 LAB — BASIC METABOLIC PANEL
Anion gap: 5 (ref 5–15)
Anion gap: 5 (ref 5–15)
BUN: 6 mg/dL (ref 6–20)
CHLORIDE: 124 mmol/L — AB (ref 101–111)
CO2: 14 mmol/L — ABNORMAL LOW (ref 22–32)
CO2: 13 mmol/L — ABNORMAL LOW (ref 22–32)
CREATININE: 1.2 mg/dL (ref 0.61–1.24)
CREATININE: 1.19 mg/dL (ref 0.61–1.24)
Calcium: 6.9 mg/dL — ABNORMAL LOW (ref 8.9–10.3)
Calcium: 7 mg/dL — ABNORMAL LOW (ref 8.9–10.3)
Chloride: 122 mmol/L — ABNORMAL HIGH (ref 101–111)
GFR calc Af Amer: >60 mL/min (ref >60)
GFR calc Af Amer: >60 mL/min (ref >60)
GFR calc non Af Amer: >60 mL/min (ref >60)
Glucose, Bld: 149 mg/dL — ABNORMAL HIGH (ref 65–99)
Glucose, Bld: 127 mg/dL — ABNORMAL HIGH (ref 65–99)
Potassium: 2.3 mmol/L — CL (ref 3.5–5.1)
Potassium: 2.6 mmol/L — CL (ref 3.5–5.1)
SODIUM: 141 mmol/L (ref 135–145)
SODIUM: 142 mmol/L (ref 135–145)

## 2015-09-14 LAB — PROTIME-INR
INR: 2.29 — AB (ref 0.00–1.49)
PROTHROMBIN TIME: 25 s — AB (ref 11.6–15.2)

## 2015-09-14 LAB — HEPATIC FUNCTION PANEL
ALK PHOS: 72 U/L (ref 38–126)
ALT: 22 U/L (ref 17–63)
AST: 37 U/L (ref 15–41)
Albumin: 2.1 g/dL — ABNORMAL LOW (ref 3.5–5.0)
BILIRUBIN DIRECT: 0.3 mg/dL (ref 0.1–0.5)
BILIRUBIN INDIRECT: 0.6 mg/dL (ref 0.3–0.9)
BILIRUBIN TOTAL: 0.9 mg/dL (ref 0.3–1.2)
Total Protein: 4.5 g/dL — ABNORMAL LOW (ref 6.5–8.1)

## 2015-09-14 LAB — MAGNESIUM: MAGNESIUM: 1.6 mg/dL — AB (ref 1.7–2.4)

## 2015-09-14 LAB — GLUCOSE, CAPILLARY: GLUCOSE-CAPILLARY: 112 mg/dL — AB (ref 65–99)

## 2015-09-14 MED ORDER — POTASSIUM PHOSPHATES 15 MMOLE/5ML IV SOLN
20.0000 mmol | Freq: Once | INTRAVENOUS | Status: AC
  Administered 2015-09-14: 20 mmol via INTRAVENOUS
  Filled 2015-09-14: qty 6.67

## 2015-09-14 MED ORDER — POTASSIUM CL IN DEXTROSE 5% 20 MEQ/L IV SOLN
20.0000 meq | INTRAVENOUS | Status: DC
  Administered 2015-09-14 – 2015-09-15 (×2): 20 meq via INTRAVENOUS
  Filled 2015-09-14 (×2): qty 1000

## 2015-09-14 MED ORDER — POTASSIUM CHLORIDE 10 MEQ/50ML IV SOLN
10.0000 meq | INTRAVENOUS | Status: AC
  Administered 2015-09-14 – 2015-09-15 (×6): 10 meq via INTRAVENOUS
  Filled 2015-09-14: qty 50

## 2015-09-14 MED ORDER — TRACE MINERALS CR-CU-MN-SE-ZN 10-1000-500-60 MCG/ML IV SOLN
INTRAVENOUS | Status: AC
  Administered 2015-09-14: 18:00:00 via INTRAVENOUS
  Filled 2015-09-14: qty 960

## 2015-09-14 MED ORDER — FAT EMULSION 20 % IV EMUL
240.0000 mL | INTRAVENOUS | Status: AC
  Administered 2015-09-14: 240 mL via INTRAVENOUS
  Filled 2015-09-14: qty 250

## 2015-09-14 MED ORDER — POTASSIUM CHLORIDE 10 MEQ/50ML IV SOLN
10.0000 meq | INTRAVENOUS | Status: AC
  Administered 2015-09-14 (×6): 10 meq via INTRAVENOUS
  Filled 2015-09-14 (×3): qty 50

## 2015-09-14 MED ORDER — POTASSIUM CHLORIDE 10 MEQ/50ML IV SOLN: 10.0000 meq | INTRAVENOUS | Status: DC

## 2015-09-14 MED ORDER — MAGNESIUM SULFATE 2 GM/50ML IV SOLN
2.0000 g | Freq: Once | INTRAVENOUS | Status: AC
  Administered 2015-09-14: 2 g via INTRAVENOUS
  Filled 2015-09-14: qty 50

## 2015-09-14 MED ORDER — INSULIN ASPART 100 UNIT/ML ~~LOC~~ SOLN
0.0000 [IU] | Freq: Four times a day (QID) | SUBCUTANEOUS | Status: DC
  Administered 2015-09-14: 1 [IU] via SUBCUTANEOUS
  Administered 2015-09-15 – 2015-09-20 (×4): 2 [IU] via SUBCUTANEOUS
  Administered 2015-09-21 (×2): 1 [IU] via SUBCUTANEOUS
  Administered 2015-09-22: 2 [IU] via SUBCUTANEOUS

## 2015-09-14 NOTE — Progress Notes (Signed)
Daily Rounding Note  09/14/2015, 12:18 PM  LOS: 4 days   SUBJECTIVE:       NGT output 1950 ml yesterday. Remains NPO. C/o abd pain and nausea Last BM 11/13.   OBJECTIVE:         Vital signs in last 24 hours:    Temp:  [98 F (36.7 C)-99.8 F (37.7 C)] 98.9 F (37.2 C) (11/15 0851) Pulse Rate:  [76-89] 89 (11/15 0400) Resp:  [24-34] 34 (11/15 0851) BP: (125-138)/(65-76) 132/76 mmHg (11/15 0851) SpO2:  [97 %-99 %] 98 % (11/15 0851) Weight:  [107 lb 12.8 oz (48.898 kg)] 107 lb 12.8 oz (48.898 kg) (11/15 0500) Last BM Date: 09/13/15 Filed Weights   09/12/15 0453 09/13/15 0500 09/14/15 0500  Weight: 102 lb 14.4 oz (46.675 kg) 101 lb 14.4 oz (46.222 kg) 107 lb 12.8 oz (48.898 kg)   General: looks weak and poorly.   Heart: RRR Chest: clear bil Abdomen: soft, hypoactive BS, no timkling or high pitched sounds  Extremities: no CCE Neuro/Psych:  [place].  Following commands.   Intake/Output from previous day: 11/14 0701 - 11/15 0700 In: 8050 [I.V.:7600; IV Piggyback:450] Out: [id number] [Urine:3300; Emesis/NG output:1950]  Intake/Output this shift: Total I/O In: 325 [I.V.:325] Out: 900 [Urine:900]  Lab Results:  Recent Labs  09/12/15 1729 09/13/15 0347 09/14/15 0600  WBC 15.6* 14.4* 16.9*  HGB 8.6* 8.2* 8.9*  HCT 25.4* 24.6* 26.4*  PLT 207 217 228   BMET  Recent Labs  09/13/15 1815 09/14/15 0600 09/14/15 1105  NA 148* 143 141  K 2.4* 2.5* 2.3*  CL 126* 124* 122*  CO2 15* 14* 14*  GLUCOSE 132* 128* 149*  BUN 8 5* 6  CREATININE 1.41* 1.24 1.20  CALCIUM 7.2* 7.1* 6.9*   LFT  Recent Labs  09/12/15 0256 09/13/15 0348 09/14/15 0600  PROT 4.7*  --  4.5*  ALBUMIN 2.2* 2.0* 2.1*  2.0*  AST 49*  --  37  ALT 28  --  22  ALKPHOS 71  --  72  BILITOT 1.4*  --  0.9  BILIDIR  --   --  0.3  IBILI  --   --  0.6   PT/INR  Recent Labs  09/13/15 0347 09/14/15 0600  LABPROT 25.5* 25.0*  INR 2.36*  2.29*   Hepatitis Panel No results for input(s): HEPBSAG, HCVAB, HEPAIGM, HEPBIGM in the last 72 hours.  Studies/Results: Dg Abd Portable 1v  09/14/2015  CLINICAL DATA:  Small bowel obstruction. EXAM: PORTABLE ABDOMEN - 1 VIEW COMPARISON:  09/10/2015 FINDINGS: There are dilated bowel loops in the mid abdomen, consistent with the given history of small-bowel obstruction. There is a moderate amount of formed stool throughout the colon. Enteric catheter is again seen, with tip at the expected location of the pylorus. Rectal probe is also noted. Mixed lytic and sclerotic appearance of the left femoral head is again seen. Query avascular necrosis. IMPRESSION: Continued small bowel obstruction, with no significant improvement from the prior radiograph. Constipation. Stable positioning of the enteric catheter with tip at the expected location of the pylorus. Electronically Signed   By: Fidela Salisbury M.D.   On: 09/14/2015 09:09     Scheduled Meds: . ampicillin-sulbactam (UNASYN) IV  3 g Intravenous Q8H  . antiseptic oral rinse  7 mL Mouth Rinse q12n4p  . chlorhexidine  15 mL Mouth Rinse BID  . pantoprazole (PROTONIX) IV  40 mg Intravenous Q24H  . potassium chloride  10 mEq Intravenous Q1 Hr x 6  . potassium phosphate IVPB (mmol)  20 mmol Intravenous Once   Continuous Infusions: . dextrose 5 % with KCl 20 mEq / L 20 mEq (09/14/15 0843)  .  sodium bicarbonate  infusion 1000 mL 75 mL/hr at 09/14/15 0639   PRN Meds:.HYDROmorphone (DILAUDID) injection  ASSESMENT:   *  PSBO.    *  Mild elevation LFTs.  GB sludge, fatty liver vs hepatocellular disease. Low GB EF on HIDA. Not convinced of cholecystitis. LFTs now normal.    *  Coumadin (currently on hold) for hx DVT leading to LE amputation.  Coags remain elevated though no Coumadin for at least 7 days. ? Some of this due to hepatic dysfunction?    *  AKI.  Improved. Baseline CKD.  Hypokalemia persists.   *  Stable anemia.  Normocytic.     *  bil PNA.     PLAN   *  Would obtain surgical opinion though almost certainly not an operative candidate.   *  Continue attempts to correct electrolytes.  Leave NGT in place.     Azucena Freed  09/14/2015, 12:18 PM Pager: [phone]

## 2015-09-14 NOTE — Progress Notes (Signed)
K+ 2.5. MD notified and new orders received.   M.Forest Gleason, RN

## 2015-09-14 NOTE — Progress Notes (Signed)
[facility] ROUNDING NOTE   Subjective:   Interval History:  Ill appearing man lying in bed  With copious greenish output from NGT   Objective:  Vital signs in last 24 hours:  Temp:  [98 F (36.7 C)-99.8 F (37.7 C)] 98.9 F (37.2 C) (11/15 0851) Pulse Rate:  [76-89] 89 (11/15 0400) Resp:  [24-34] 34 (11/15 0851) BP: (125-138)/(65-76) 132/76 mmHg (11/15 0851) SpO2:  [97 %-99 %] 98 % (11/15 0851) Weight:  [48.898 kg (107 lb 12.8 oz)] 48.898 kg (107 lb 12.8 oz) (11/15 0500)  Weight change: 2.676 kg (5 lb 14.4 oz) Filed Weights   09/12/15 0453 09/13/15 0500 09/14/15 0500  Weight: 46.675 kg (102 lb 14.4 oz) 46.222 kg (101 lb 14.4 oz) 48.898 kg (107 lb 12.8 oz)    Intake/Output: I/O last 3 completed shifts: In: [id number] [I.V.:10700; IV Piggyback:550] Out: [id number] [Urine:5525; Emesis/NG [id number]   Intake/Output this shift:  Total I/O In: 325 [I.V.:325] Out: 900 [Urine:900]  General: Very thin, cachectic HEENT: mucous membranes dry NGT --- suction  Neck: There is no JVD Heart: Regular rate and rhythm Lungs: Clear to auscultation bilaterally Abdomen: Decreased breath sounds, tender to palpation Extremities: No edema Neuro: Alert and nonfocal- some difficulty in understanding his speech   Basic Metabolic Panel:  Recent Labs Lab 09/12/15 0256 09/12/15 0430 09/12/15 1729 09/13/15 0348 09/13/15 1815 09/14/15 0600  NA 151*  --  155* 152* 148* 143  K 2.3*  --  2.5* 2.5* 2.4* 2.5*  CL 130*  --  >130* >130* 126* 124*  CO2 15*  --  14* 15* 15* 14*  GLUCOSE 143*  --  128* 196* 132* 128*  BUN 18  --  15 11 8  5*  CREATININE 1.75*  --  1.56* 1.53* 1.41* 1.24  CALCIUM 7.3*  --  7.6* 7.1* 7.2* 7.1*  MG  --  1.5*  --   --   --  1.6*  PHOS  --   --   --  1.8*  --  1.4*    Liver Function Tests:  Recent Labs Lab 09/10/15 2022 09/11/15 0616 09/12/15 0256 09/13/15 0348 09/14/15 0600  AST 62* 56* 49*  --  37  ALT 36 33 28  --  22  ALKPHOS 78 78 71  --   72  BILITOT 2.1* 1.3* 1.4*  --  0.9  PROT 4.9* 4.8* 4.7*  --  4.5*  ALBUMIN 2.3* 2.2* 2.2* 2.0* 2.1*  2.0*   No results for input(s): LIPASE, AMYLASE in the last 168 hours. No results for input(s): AMMONIA in the last 168 hours.  CBC:  Recent Labs Lab 09/11/15 0616 09/11/15 1525 09/12/15 0256 09/12/15 1729 09/13/15 0347 09/14/15 0600  WBC 16.6* 18.2* 15.9* 15.6* 14.4* 16.9*  NEUTROABS 13.7* 15.0* 13.0* 12.5* 11.5*  --   HGB 8.2* 8.5* 8.4* 8.6* 8.2* 8.9*  HCT 23.9* 24.5* 24.7* 25.4* 24.6* 26.4*  MCV 81.8 84.2 83.4 84.1 85.1 84.9  PLT 194 191 199 207 217 228    Cardiac Enzymes: No results for input(s): CKTOTAL, CKMB, CKMBINDEX, TROPONINI in the last 168 hours.  BNP: Invalid input(s): POCBNP  CBG: No results for input(s): GLUCAP in the last 168 hours.  Microbiology: Results for orders placed or performed during the hospital encounter of 09/10/15  MRSA PCR Screening     Status: None   Collection Time: 09/10/15  6:23 PM  Result Value Ref Range Status   MRSA by PCR NEGATIVE NEGATIVE Final    Comment:  The GeneXpert MRSA Assay (FDA approved for NASAL specimens only), is one component of a comprehensive MRSA colonization surveillance program. It is not intended to diagnose MRSA infection nor to guide or monitor treatment for MRSA infections.   Culture, blood (routine x 2)     Status: None (Preliminary result)   Collection Time: 09/10/15 11:25 PM  Result Value Ref Range Status   Specimen Description BLOOD LEFT ANTECUBITAL  Final   Special Requests BOTTLES DRAWN AEROBIC AND ANAEROBIC 10CC   Final   Culture NO GROWTH 3 DAYS  Final   Report Status PENDING  Incomplete  Culture, blood (routine x 2)     Status: None (Preliminary result)   Collection Time: 09/10/15 11:30 PM  Result Value Ref Range Status   Specimen Description BLOOD RIGHT ARM  Final   Special Requests BOTTLES DRAWN AEROBIC AND ANAEROBIC 5CC  Final   Culture NO GROWTH 2 DAYS  Final   Report  Status PENDING  Incomplete    Coagulation Studies:  Recent Labs  09/12/15 0256 09/13/15 0347 09/14/15 0600  LABPROT 24.8* 25.5* 25.0*  INR 2.26* 2.36* 2.29*    Urinalysis: No results for input(s): COLORURINE, LABSPEC, PHURINE, GLUCOSEU, HGBUR, BILIRUBINUR, KETONESUR, PROTEINUR, UROBILINOGEN, NITRITE, LEUKOCYTESUR in the last 72 hours.  Invalid input(s): APPERANCEUR    Imaging: Dg Abd Portable 1v  09/14/2015  CLINICAL DATA:  Small bowel obstruction. EXAM: PORTABLE ABDOMEN - 1 VIEW COMPARISON:  09/10/2015 FINDINGS: There are dilated bowel loops in the mid abdomen, consistent with the given history of small-bowel obstruction. There is a moderate amount of formed stool throughout the colon. Enteric catheter is again seen, with tip at the expected location of the pylorus. Rectal probe is also noted. Mixed lytic and sclerotic appearance of the left femoral head is again seen. Query avascular necrosis. IMPRESSION: Continued small bowel obstruction, with no significant improvement from the prior radiograph. Constipation. Stable positioning of the enteric catheter with tip at the expected location of the pylorus. Electronically Signed   By: Fidela Salisbury M.D.   On: 09/14/2015 09:09     Medications:   . dextrose 5 % with KCl 20 mEq / L 20 mEq (09/14/15 0843)  .  sodium bicarbonate  infusion 1000 mL 75 mL/hr at 09/14/15 0639   . ampicillin-sulbactam (UNASYN) IV  3 g Intravenous Q8H  . antiseptic oral rinse  7 mL Mouth Rinse q12n4p  . chlorhexidine  15 mL Mouth Rinse BID  . magnesium sulfate 1 - 4 g bolus IVPB  2 g Intravenous Once  . pantoprazole (PROTONIX) IV  40 mg Intravenous Q24H   HYDROmorphone (DILAUDID) injection  Assessment/ Plan:  1.Renal- acute renal failure likely due to previous sepsis and likely volume depletion. Renal function is now normal and Is making good urine.  2. Metabolic acidosis - is non-gap. We will supplement with IV bicarbonate. Puzzling probably due  to renal failure. 3. Hypernatremia and hyperchloremia- likely secondary to a lot of normal saline given at the other hospital.  Improved  4. Hypokalemia- agree with repletion  Will need IV  5. Anemia - significant in nature. Possibly some due to HIV and recent significant hospitalization as well as acute kidney injury. Supportive care for now  LOS: 4 Dequane Strahan W @TODAY @11 :07 AM

## 2015-09-14 NOTE — Progress Notes (Signed)
Patient seen and examined. Case d/w residents in detail. I agree with findings and plan as documented in Dr. Elon Jester note.  Patient seems more withdrawn today. Still with copious NGT bilious output. Abd xray with persistent SBO. Would start TPN per pharmacy and attempt to correct electrolyte abnormalities and provide nutritional support. If patient with persistent SB despite normalization of electrolytes will get surgery input.  Septic shock resolving. C/w unasyn for klebsiella PNA bacteremia. Blood cx with NGTD. AKI slowly improving. Nephrology f/u appreciated. Will monitor on IVF

## 2015-09-14 NOTE — Progress Notes (Signed)
CRITICAL VALUE ALERT  Critical value received: K+ 2.3  Date of notification:  09/14/15  Time of notification:  1142  Critical value read back:Yes.    Nurse who received alert Will Bonnet, RN  MD notified (1st page): Dr. Marlowe Sax   Time of first page:  1205  MD notified (2nd page):  Time of second page:  Responding MD:  Dr. Marlowe Sax  Time MD responded: [id number]

## 2015-09-14 NOTE — Progress Notes (Signed)
Inpatient Rehabilitation  Patient was screened by Havier Deeb for appropriateness for an Inpatient Acute Rehab consult.  At this time, we are recommending Inpatient Rehab consult.  Please order when you feel appropriate.   Shunte Senseney PT Inpatient Rehab Admissions Coordinator Cell [phone] Office [phone]   

## 2015-09-14 NOTE — Progress Notes (Signed)
PARENTERAL NUTRITION CONSULT NOTE - INITIAL  Pharmacy Consult for TPN Indication: prolonged ileus  Allergies  Allergen Reactions  . Dapsone     REACTION: fever  . Sulfamethoxazole-Trimethoprim     REACTION: fever    Patient Measurements: Height: 6' (182.9 cm) Weight: 107 lb 12.8 oz (48.898 kg) IBW/kg (Calculated) : 77.6   Vital Signs: Temp: 98 F (36.7 C) (11/15 1200) Temp Source: Oral (11/15 1200) BP: 131/66 mmHg (11/15 1200) Pulse Rate: 98 (11/15 1200) Intake/Output from previous day: 11/14 0701 - 11/15 0700 In: 8050 [I.V.:7600; IV Piggyback:450] Out: [id number] [Urine:3300; Emesis/NG output:1950] Intake/Output from this shift: Total I/O In: 325 [I.V.:325] Out: 900 [Urine:900]  Labs:  Recent Labs  09/12/15 0256 09/12/15 1729 09/13/15 0347 09/14/15 0600  WBC 15.9* 15.6* 14.4* 16.9*  HGB 8.4* 8.6* 8.2* 8.9*  HCT 24.7* 25.4* 24.6* 26.4*  PLT 199 207 217 228  INR 2.26*  --  2.36* 2.29*     Recent Labs  09/12/15 0256 09/12/15 0430  09/13/15 0348 09/13/15 0850 09/13/15 1815 09/14/15 0600 09/14/15 1105  NA 151*  --   < > 152*  --  148* 143 141  K 2.3*  --   < > 2.5*  --  2.4* 2.5* 2.3*  CL 130*  --   < > >130*  --  126* 124* 122*  CO2 15*  --   < > 15*  --  15* 14* 14*  GLUCOSE 143*  --   < > 196*  --  132* 128* 149*  BUN 18  --   < > 11  --  8 5* 6  CREATININE 1.75*  --   < > 1.53*  --  1.41* 1.24 1.20  LABCREA  --   --   --   --  32.36  --   --   --   CALCIUM 7.3*  --   < > 7.1*  --  7.2* 7.1* 6.9*  MG  --  1.5*  --   --   --   --  1.6*  --   PHOS  --   --   --  1.8*  --   --  1.4*  --   PROT 4.7*  --   --   --   --   --  4.5*  --   ALBUMIN 2.2*  --   --  2.0*  --   --  2.1*  2.0*  --   AST 49*  --   --   --   --   --  37  --   ALT 28  --   --   --   --   --  22  --   ALKPHOS 71  --   --   --   --   --  72  --   BILITOT 1.4*  --   --   --   --   --  0.9  --   BILIDIR  --   --   --   --   --   --  0.3  --   IBILI  --   --   --   --   --   --  0.6   --   < > = values in this interval not displayed. Estimated Creatinine Clearance: 48.7 mL/min (by C-G formula based on Cr of 1.2).   No results for input(s): GLUCAP in the last 72 hours.  Medical History: Past Medical History  Diagnosis Date  . History of chicken pox   . Hyperlipidemia   . History of DVT of lower extremity     right  . Left hip pain 03/09/2013  . Preventative health care 03/09/2013  . Pneumonia   . Chronic kidney disease     Medications:  Prescriptions prior to admission  Medication Sig Dispense Refill Last Dose  . aspirin 81 MG tablet Take 81 mg by mouth daily.     09/10/2015 at Unknown time  . ENSURE (ENSURE) Take 1 Can by mouth 3 (three) times daily between meals. 237 mL prn 09/09/2015 at Unknown time  . fenofibrate (TRICOR) 145 MG tablet Take 1 tablet (145 mg total) by mouth daily. 30 tablet 5 09/09/2015 at Unknown time  . gabapentin (NEURONTIN) 400 MG capsule TAKE ONE CAPSULE BY MOUTH FOUR TIMES DAILY 120 capsule 5 09/09/2015 at Unknown time  . morphine (MS CONTIN) 60 MG 12 hr tablet Take 2 tablets (120 mg total) by mouth every 12 (twelve) hours. 120 tablet 0 09/09/2015 at Unknown time  . oxyCODONE (OXY IR/ROXICODONE) 5 MG immediate release tablet Take 1 tablet (5 mg total) by mouth every 6 (six) hours as needed for severe pain. 60 tablet 0 09/09/2015 at Unknown time  . potassium chloride SA (K-DUR,KLOR-CON) 20 MEQ tablet TAKE 1 TABLET BY MOUTH EVERY DAY 30 tablet 3 09/10/2015 at Unknown time  . [name] 50 MG tablet TAKE 1 TABLET (50 MG TOTAL) BY MOUTH DAILY. 30 tablet 11 09/10/2015 at Unknown time  . [name] 300 MG tablet TAKE 1 TABLET (300 MG TOTAL) BY MOUTH DAILY. 30 tablet 11 09/10/2015 at Unknown time  . warfarin (COUMADIN) 5 MG tablet Take 2.5-5 mg by mouth daily. On Sunday Monday Wednesday Friday patient takes 2.5 mg and on all other days patient takes 5 mg.   09/05/2015 at unknown  . EVOTAZ 300-150 MG per tablet TAKE 1 TABLET BY MOUTH DAILY. SWALLOW WHOLE. DO  NOT CRUSH, CUT OR CHEW TABLET. TAKE WITH FOOD. 30 tablet 11 09/03/2015    Insulin Requirements in the past 24 hours:  none  Current Nutrition:  NPO  Assessment: 54 yo man with pSBO.  Pharmacy consulted to dose TPN for nutrition support.  GI: NGO 1950 yest.  Lytes: severely depleted electrolytes despite daily repletion; K 2.5>2.3 6 runs ordered, , phos 1.4 20 mM kphos ordered, mag 1.6 - 2 gm bolus ordered, na 141, Cl 122,  IVF D5150 bicarb at 50 and D520 K at 250 ml/hr to end tonight at 2200 Renal: ARF due to previous sepsis and likely volume depletion. Renal fxn now normal and good UOP, metabolic acidosis, non-gap, IV bicarb.  Hepatic: alb 2. LFTs OK.  Endo: no hx DM.  Infectious Disease  54 y/o M transfer from [facility], had been on Merrem/Fluconazole, had been in acute renal failure but Scr has trended down to 1.2 (~7 originally at [facility]), WBC 16.9l  Bilateral PNA, klebsiella bacteremia  Total abx D #9   11/12 Unasyn>> 11/11 Bc>>ngtd HIV+ Continue to hold ART while NPO He has previously been well controlled.   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 UGIB resolved. IV PPI  IV access: triple lumen CVC in R sublavian TPN start date:  111/5  Nutritional Goals: per RD 11/14 1600-1800  kCal, 80-90 grams of protein per day  Plan: -start  Clinimix E 5/15 at 40 ml/h + IVFE at 10 ml/hr to provide 48 gm protein and 1162 kcals - add empiric SSI/CBGs - will DC at goal rate if not needed - will add MVI and trace elements to TPN - mag, phos and K being replaced by MD - consider being more aggressive with replacement - IVF per MD: d520K at 250 ml/hr until 2200 and Dw 150 bicarb at 75 ml/hr - TPN labs ordered  Eudelia Bunch, Pharm.D. BP:[id number] 09/14/2015 3:38 PM

## 2015-09-14 NOTE — Progress Notes (Signed)
Subjective: Patient was seen and examined at bedside today. Still having significant dark NGT output and complaining of some diffuse abdominal pain. As per nursing staff, he continues to have daily bowel movements. Patient did not answer any more questions.    Objective: Vital signs in last 24 hours: Filed Vitals:   09/14/15 0400 09/14/15 0500 09/14/15 0851 09/14/15 1200  BP: 138/66  132/76 131/66  Pulse: 89   98  Temp: 98 F (36.7 C)  98.9 F (37.2 C) 98 F (36.7 C)  TempSrc: Axillary  Oral Oral  Resp: 24  34 34  Height:      Weight:  107 lb 12.8 oz (48.898 kg)    SpO2: 98%  98% 99%   Weight change: 5 lb 14.4 oz (2.676 kg)  Intake/Output Summary (Last 24 hours) at 09/14/15 2009 Last data filed at 09/14/15 1900  Gross per 24 hour  Intake   3900 ml  Output   6300 ml  Net  -2400 ml   Physical Exam  Constitutional: He is oriented to person, place, and time. No distress.  Cachectic   Cardiovascular: Normal rate, regular rhythm and intact distal pulses.  Exam reveals no gallop and no friction rub.   No murmur heard. Pulmonary/Chest: He has no wheezes.  Rhonchi   Abdominal: He exhibits no distension. There is tenderness. There is no rebound and no guarding.  Hypoactive bowel sounds. Diffuse mild TTP. Has dark NGT output.   Musculoskeletal:  R AKA stump well healed No LLE edema  Neurological: He is alert and oriented to person, place, and time.  Skin: Skin is warm and dry.   Lab Results: Basic Metabolic Panel:  Recent Labs Lab 09/12/15 0430  09/13/15 0348  09/14/15 0600 09/14/15 1105  NA  --   < > 152*  < > 143 141  K  --   < > 2.5*  < > 2.5* 2.3*  CL  --   < > >130*  < > 124* 122*  CO2  --   < > 15*  < > 14* 14*  GLUCOSE  --   < > 196*  < > 128* 149*  BUN  --   < > 11  < > 5* 6  CREATININE  --   < > 1.53*  < > 1.24 1.20  CALCIUM  --   < > 7.1*  < > 7.1* 6.9*  MG 1.5*  --   --   --  1.6*  --   PHOS  --   --  1.8*  --  1.4*  --   < > = values in this interval  not displayed. Liver Function Tests:  Recent Labs Lab 09/12/15 0256 09/13/15 0348 09/14/15 0600  AST 49*  --  37  ALT 28  --  22  ALKPHOS 71  --  72  BILITOT 1.4*  --  0.9  PROT 4.7*  --  4.5*  ALBUMIN 2.2* 2.0* 2.1*  2.0*   CBC:  Recent Labs Lab 09/12/15 1729 09/13/15 0347 09/14/15 0600  WBC 15.6* 14.4* 16.9*  NEUTROABS 12.5* 11.5*  --   HGB 8.6* 8.2* 8.9*  HCT 25.4* 24.6* 26.4*  MCV 84.1 85.1 84.9  PLT 207 217 228   Coagulation:  Recent Labs Lab 09/10/15 2022 09/12/15 0256 09/13/15 0347 09/14/15 0600  LABPROT 21.1* 24.8* 25.5* 25.0*  INR 1.83* 2.26* 2.36* 2.29*   Urine Drug Screen: Drugs of Abuse     Component Value Date/Time  LABOPIA * 06/23/2009 1831    POSITIVE (NOTE) Result repeated and verified. Sent for confirmatory testing   COCAINSCRNUR NEGATIVE 06/23/2009 1831   LABBENZ NEGATIVE 06/23/2009 1831   AMPHETMU NEGATIVE 06/23/2009 1831    Micro Results: Recent Results (from the past 240 hour(s))  MRSA PCR Screening     Status: None   Collection Time: 09/10/15  6:23 PM  Result Value Ref Range Status   MRSA by PCR NEGATIVE NEGATIVE Final    Comment:        The GeneXpert MRSA Assay (FDA approved for NASAL specimens only), is one component of a comprehensive MRSA colonization surveillance program. It is not intended to diagnose MRSA infection nor to guide or monitor treatment for MRSA infections.   Culture, blood (routine x 2)     Status: None (Preliminary result)   Collection Time: 09/10/15 11:25 PM  Result Value Ref Range Status   Specimen Description BLOOD LEFT ANTECUBITAL  Final   Special Requests BOTTLES DRAWN AEROBIC AND ANAEROBIC 10CC   Final   Culture NO GROWTH 4 DAYS  Final   Report Status PENDING  Incomplete  Culture, blood (routine x 2)     Status: None (Preliminary result)   Collection Time: 09/10/15 11:30 PM  Result Value Ref Range Status   Specimen Description BLOOD RIGHT ARM  Final   Special Requests BOTTLES DRAWN  AEROBIC AND ANAEROBIC 5CC  Final   Culture NO GROWTH 3 DAYS  Final   Report Status PENDING  Incomplete   Studies/Results: Dg Abd Portable 1v  09/14/2015  CLINICAL DATA:  Small bowel obstruction. EXAM: PORTABLE ABDOMEN - 1 VIEW COMPARISON:  09/10/2015 FINDINGS: There are dilated bowel loops in the mid abdomen, consistent with the given history of small-bowel obstruction. There is a moderate amount of formed stool throughout the colon. Enteric catheter is again seen, with tip at the expected location of the pylorus. Rectal probe is also noted. Mixed lytic and sclerotic appearance of the left femoral head is again seen. Query avascular necrosis. IMPRESSION: Continued small bowel obstruction, with no significant improvement from the prior radiograph. Constipation. Stable positioning of the enteric catheter with tip at the expected location of the pylorus. Electronically Signed   By: Fidela Salisbury M.D.   On: 09/14/2015 09:09   Medications: I have reviewed the patient's current medications. Scheduled Meds: . ampicillin-sulbactam (UNASYN) IV  3 g Intravenous Q8H  . antiseptic oral rinse  7 mL Mouth Rinse q12n4p  . chlorhexidine  15 mL Mouth Rinse BID  . [START ON 09/15/2015] insulin aspart  0-9 Units Subcutaneous 4 times per day  . pantoprazole (PROTONIX) IV  40 mg Intravenous Q24H   Continuous Infusions: . dextrose 5 % with KCl 20 mEq / L 20 mEq (09/14/15 1959)  . Marland KitchenTPN (CLINIMIX-E) Adult 40 mL/hr at 09/14/15 1829   And  . fat emulsion 240 mL (09/14/15 1829)  .  sodium bicarbonate  infusion 1000 mL 75 mL/hr at 09/14/15 1958   PRN Meds:.HYDROmorphone (DILAUDID) injection Assessment/Plan: Active Problems:   Human immunodeficiency virus (HIV) disease (HCC)   Alcohol abuse   Status post above knee amputation ([place])   Long term current use of anticoagulant   Avascular necrosis of femoral head (HCC)   Septic shock (HCC)   Gram-negative bacteremia (HCC)   Abdominal pain   Pneumonia due to  Klebsiella pneumoniae (HCC)   Pressure ulcer   Protein-calorie malnutrition, severe  Septic shock 2/2 to Klebsiella pneumoniae Bacteremia: likely from GI (aspiratin) vs lung  improving. Was on pressors at [facility]. Imaging shows bilateral pneumonia has improved since 11/8. Afebrile currently. Blood culture showing no growth in 4 days. Patient is tachypnic (RR in 24-34) but satting 98% on RA. Pulse and BP stable.  - Final blood culture pending - Unasyn 3g q8h IV for coverage of Klebsiella and possibly anaerobes  Partial SBO: Abdominal X-ray showing partial SBO, improved since 11/8. The patient takes scheduled MS contin at home 60 mg x 12h with oxycodone IR 5 mg for breakthrough, which may have caused his SBO. In addition, iron released from recent GI bleed could have also caused constipation. Gallstone ileus is also on the differential. Has hypoactive BS, abdomen mildly TTP. continues to have dark fluid from NG tube (2L in the past 24 hrs). GI is on board.  - Continue NG suction - Hydromorphone 1 mg q4h prn - D5 fluid (rate reduced from 250cc/hr to 100 cc/hr) - TPN initiated   Hematemesis: Unclear source of upper GI bleed. Hgb currently 8.9, with baseline over 13. Likely in the setting of elevated INR during previous hospitalization. INR 2.29 now. Dark fluid from NG tube (2L in the past 24 hrs) and patient reports having dark stool yesterday. GI has been consulted for further evaluation.  - Protonix IV 40 mg - trend cbc - Appreciate GI recs  GB dysfunction: cholestasis, EF 27% per HIDA.  - GI has been consulted, appreciate recs  High INR without warfarin - with History of Unprovoked DVT:  On anticoagulation indefinitely. Patient has consistently been supratherapeutic at anti-coag visits and presented to 10 at [facility].  - Holding warfarin but patient is auto-anticoagulation likely 2/2 to sepsis causing synthetic dysfunction of liver.  - cont monitoring cbc and INR  Acute on  Chronic Kidney Disease - likely 2/2 to ATN: Baseline creatinine of ~1.2. Was in 7's at outside hospital, now improving to 1.24. Has good UOP.  - continue to monitor. Cont fluids.   Non gap Metabolic Acidosis: AG is resolved as no longer has lactic acidosis but persistently has low bicarb (14 today).  - appreciate nephro recs. Continue IV bicarb.  Hypokalemia and hypoMag - K 2.5 this AM. Mag 1.6.  -IV KCl 10 meq q1 x6 + replete Mag  Hypophosphatemia - Phosphorous 1.4 today. -Potassium phosphate 20 mmol IV  Hypernatremia - likely 2/2 to free water deficit Received Normal Saline extensively before transferring to [place]. Na improved to 143 today.  -Continue D5 to replete free water deficit and also to help with hypernatremia. Rate decreased from 250 cc/hr to 100 cc/hr.  HIV: CD4 decreased from 700 to 219 in a matter of 2 weeks, likely in the setting of acute illness. Viral load <20 in 07/2015.    - Restart anti-retroviral regimen after NPO status -F/u HLA B-57 (01)  Elevated AST: To 62. Likely downtrending from previous septic shock, but could represent underlying liver dysfunction driving up his INR. - F/u repeat CMP  Stage II pressure ulcer -Foam dressing as recommended by wound care   Avascular Necrosis of Hip and Phantom Limb Pain: Pain management per above.   DVT Prophylaxis: SCDs Code Status: Full Admit to: Step down  Dispo: Disposition is deferred at this time, awaiting improvement of current medical problems.  Anticipated discharge in approximately 2-3 day(s).   The patient does have a current PCP Iline Oven, MD) and does need an [facility] hospital follow-up appointment after discharge.  The patient does not have transportation limitations that hinder transportation  to clinic appointments.  .Services Needed at time of discharge: Y = Yes, Blank = No PT:   OT:   RN:   Equipment:   Other:     LOS: 4 days   Shela Leff, MD 09/14/2015, 8:09 PM

## 2015-09-15 ENCOUNTER — Encounter (HOSPITAL_COMMUNITY): Payer: Self-pay | Admitting: Physician Assistant

## 2015-09-15 DIAGNOSIS — R7881 Bacteremia: Secondary | ICD-10-CM

## 2015-09-15 DIAGNOSIS — J150 Pneumonia due to Klebsiella pneumoniae: Secondary | ICD-10-CM

## 2015-09-15 DIAGNOSIS — E870 Hyperosmolality and hypernatremia: Secondary | ICD-10-CM | POA: Insufficient documentation

## 2015-09-15 DIAGNOSIS — E876 Hypokalemia: Secondary | ICD-10-CM

## 2015-09-15 DIAGNOSIS — R6521 Severe sepsis with septic shock: Secondary | ICD-10-CM

## 2015-09-15 DIAGNOSIS — K56609 Unspecified intestinal obstruction, unspecified as to partial versus complete obstruction: Secondary | ICD-10-CM | POA: Insufficient documentation

## 2015-09-15 DIAGNOSIS — A419 Sepsis, unspecified organism: Principal | ICD-10-CM

## 2015-09-15 LAB — GLUCOSE, CAPILLARY
GLUCOSE-CAPILLARY: 181 mg/dL — AB (ref 65–99)
Glucose-Capillary: 101 mg/dL — ABNORMAL HIGH (ref 65–99)
Glucose-Capillary: 116 mg/dL — ABNORMAL HIGH (ref 65–99)
Glucose-Capillary: 139 mg/dL — ABNORMAL HIGH (ref 65–99)

## 2015-09-15 LAB — COMPREHENSIVE METABOLIC PANEL
ALK PHOS: 77 U/L (ref 38–126)
ALT: 20 U/L (ref 17–63)
ANION GAP: 6 (ref 5–15)
AST: 28 U/L (ref 15–41)
Albumin: 2.1 g/dL — ABNORMAL LOW (ref 3.5–5.0)
BILIRUBIN TOTAL: 0.9 mg/dL (ref 0.3–1.2)
BUN: 5 mg/dL — ABNORMAL LOW (ref 6–20)
CALCIUM: 7.2 mg/dL — AB (ref 8.9–10.3)
CO2: 13 mmol/L — ABNORMAL LOW (ref 22–32)
Chloride: 127 mmol/L — ABNORMAL HIGH (ref 101–111)
Creatinine, Ser: 1.11 mg/dL (ref 0.61–1.24)
Glucose, Bld: 135 mg/dL — ABNORMAL HIGH (ref 65–99)
POTASSIUM: 3.1 mmol/L — AB (ref 3.5–5.1)
Sodium: 146 mmol/L — ABNORMAL HIGH (ref 135–145)
TOTAL PROTEIN: 4.7 g/dL — AB (ref 6.5–8.1)

## 2015-09-15 LAB — CULTURE, BLOOD (ROUTINE X 2): CULTURE: NO GROWTH

## 2015-09-15 LAB — PREALBUMIN: PREALBUMIN: 10.6 mg/dL — AB (ref 18–38)

## 2015-09-15 LAB — BASIC METABOLIC PANEL
ANION GAP: 5 (ref 5–15)
BUN: 5 mg/dL — AB (ref 6–20)
CALCIUM: 7 mg/dL — AB (ref 8.9–10.3)
CO2: 13 mmol/L — AB (ref 22–32)
CREATININE: 1.06 mg/dL (ref 0.61–1.24)
Chloride: 126 mmol/L — ABNORMAL HIGH (ref 101–111)
GFR calc Af Amer: >60 mL/min (ref >60)
GLUCOSE: 129 mg/dL — AB (ref 65–99)
Potassium: 3.3 mmol/L — ABNORMAL LOW (ref 3.5–5.1)
Sodium: 144 mmol/L (ref 135–145)

## 2015-09-15 LAB — PROTIME-INR
INR: 1.73 — ABNORMAL HIGH (ref 0.00–1.49)
PROTHROMBIN TIME: 20.2 s — AB (ref 11.6–15.2)

## 2015-09-15 LAB — TRIGLYCERIDES: TRIGLYCERIDES: 100 mg/dL (ref <150)

## 2015-09-15 LAB — PHOSPHORUS: PHOSPHORUS: 1.9 mg/dL — AB (ref 2.5–4.6)

## 2015-09-15 LAB — MAGNESIUM: MAGNESIUM: 1.8 mg/dL (ref 1.7–2.4)

## 2015-09-15 MED ORDER — DEXTROSE 5 % IV SOLN
20.0000 mmol | Freq: Once | INTRAVENOUS | Status: AC
  Administered 2015-09-15: 20 mmol via INTRAVENOUS
  Filled 2015-09-15: qty 6.67

## 2015-09-15 MED ORDER — TRACE MINERALS CR-CU-MN-SE-ZN 10-1000-500-60 MCG/ML IV SOLN
INTRAVENOUS | Status: DC
  Administered 2015-09-15: 19:00:00 via INTRAVENOUS
  Filled 2015-09-15: qty 960

## 2015-09-15 MED ORDER — FAT EMULSION 20 % IV EMUL
240.0000 mL | INTRAVENOUS | Status: DC
  Administered 2015-09-15: 240 mL via INTRAVENOUS
  Filled 2015-09-15: qty 250

## 2015-09-15 MED ORDER — DEXTROSE 5 % IV SOLN
0.3000 ug/kg | Freq: Once | INTRAVENOUS | Status: AC
  Administered 2015-09-15: 13.6 ug via INTRAVENOUS
  Filled 2015-09-15: qty 3.4

## 2015-09-15 MED ORDER — SORBITOL 70 % SOLN
960.0000 mL | TOPICAL_OIL | Freq: Once | ORAL | Status: AC
  Administered 2015-09-15: 960 mL via RECTAL
  Filled 2015-09-15: qty 240

## 2015-09-15 MED ORDER — POTASSIUM CL IN DEXTROSE 5% 20 MEQ/L IV SOLN
20.0000 meq | INTRAVENOUS | Status: AC
  Filled 2015-09-15 (×2): qty 1000

## 2015-09-15 MED ORDER — POTASSIUM CHLORIDE 10 MEQ/50ML IV SOLN
10.0000 meq | INTRAVENOUS | Status: AC
  Administered 2015-09-15 (×6): 10 meq via INTRAVENOUS

## 2015-09-15 MED ORDER — MAGNESIUM SULFATE IN D5W 10-5 MG/ML-% IV SOLN
1.0000 g | Freq: Once | INTRAVENOUS | Status: AC
  Administered 2015-09-15: 1 g via INTRAVENOUS
  Filled 2015-09-15: qty 100

## 2015-09-15 MED ORDER — POTASSIUM CHLORIDE 10 MEQ/100ML IV SOLN
10.0000 meq | INTRAVENOUS | Status: AC
  Administered 2015-09-15 – 2015-09-16 (×4): 10 meq via INTRAVENOUS
  Filled 2015-09-15 (×4): qty 100

## 2015-09-15 MED ORDER — POTASSIUM CL IN DEXTROSE 5% 20 MEQ/L IV SOLN
20.0000 meq | INTRAVENOUS | Status: AC
  Administered 2015-09-16: 20 meq via INTRAVENOUS
  Filled 2015-09-15 (×3): qty 1000

## 2015-09-15 NOTE — Progress Notes (Signed)
[facility] ROUNDING NOTE   Subjective:   Interval History:  Lying in bed  No real change  General surgery now consulted for SBO  Objective:  Vital signs in last 24 hours:  Temp:  [98 F (36.7 C)-99.9 F (37.7 C)] 99.1 F (37.3 C) (11/16 0800) Pulse Rate:  [89-113] 99 (11/16 0800) Resp:  [20-34] 26 (11/16 0800) BP: (100-136)/(64-74) 104/67 mmHg (11/16 0800) SpO2:  [97 %-100 %] 100 % (11/16 0800) Weight:  [45.4 kg (100 lb 1.4 oz)] 45.4 kg (100 lb 1.4 oz) (11/16 0500)  Weight change: -3.498 kg (-7 lb 11.4 oz) Filed Weights   09/13/15 0500 09/14/15 0500 09/15/15 0500  Weight: 46.222 kg (101 lb 14.4 oz) 48.898 kg (107 lb 12.8 oz) 45.4 kg (100 lb 1.4 oz)    Intake/Output: I/O last 3 completed shifts: In: 7175.8 [I.V.:6300; IV Piggyback:250] Out: [id number] [Urine:11100; Emesis/NG [id number]   Intake/Output this shift:  Total I/O In: 275 [I.V.:175; IV Piggyback:50; TPN:50] Out: 225 [Urine:225]  General: Very thin, cachectic HEENT: mucous membranes dry NGT --- suction  Neck: There is no JVD Heart: Regular rate and rhythm Lungs: Clear to auscultation bilaterally Abdomen: Decreased breath sounds, tender to palpation Extremities: No edema Neuro: Alert and nonfocal- some difficulty in understanding his speech   Basic Metabolic Panel:  Recent Labs Lab 09/12/15 0430  09/13/15 0348 09/13/15 1815 09/14/15 0600 09/14/15 1105 09/14/15 2021 09/15/15 0640  NA  --   < > 152* 148* 143 141 142 146*  K  --   < > 2.5* 2.4* 2.5* 2.3* 2.6* 3.1*  CL  --   < > >130* 126* 124* 122* 124* 127*  CO2  --   < > 15* 15* 14* 14* 13* 13*  GLUCOSE  --   < > 196* 132* 128* 149* 127* 135*  BUN  --   < > 11 8 5* 6 <5* 5*  CREATININE  --   < > 1.53* 1.41* 1.24 1.20 1.19 1.11  CALCIUM  --   < > 7.1* 7.2* 7.1* 6.9* 7.0* 7.2*  MG 1.5*  --   --   --  1.6*  --   --  1.8  PHOS  --   --  1.8*  --  1.4*  --   --  1.9*  < > = values in this interval not displayed.  Liver Function  Tests:  Recent Labs Lab 09/10/15 2022 09/11/15 0616 09/12/15 0256 09/13/15 0348 09/14/15 0600 09/15/15 0640  AST 62* 56* 49*  --  37 28  ALT 36 33 28  --  22 20  ALKPHOS 78 78 71  --  72 77  BILITOT 2.1* 1.3* 1.4*  --  0.9 0.9  PROT 4.9* 4.8* 4.7*  --  4.5* 4.7*  ALBUMIN 2.3* 2.2* 2.2* 2.0* 2.1*  2.0* 2.1*   No results for input(s): LIPASE, AMYLASE in the last 168 hours. No results for input(s): AMMONIA in the last 168 hours.  CBC:  Recent Labs Lab 09/11/15 0616 09/11/15 1525 09/12/15 0256 09/12/15 1729 09/13/15 0347 09/14/15 0600  WBC 16.6* 18.2* 15.9* 15.6* 14.4* 16.9*  NEUTROABS 13.7* 15.0* 13.0* 12.5* 11.5*  --   HGB 8.2* 8.5* 8.4* 8.6* 8.2* 8.9*  HCT 23.9* 24.5* 24.7* 25.4* 24.6* 26.4*  MCV 81.8 84.2 83.4 84.1 85.1 84.9  PLT 194 191 199 207 217 228    Cardiac Enzymes: No results for input(s): CKTOTAL, CKMB, CKMBINDEX, TROPONINI in the last 168 hours.  BNP: Invalid input(s): POCBNP  CBG:  Recent Labs Lab 09/14/15 1745 09/14/15 2334 09/15/15 0644  GLUCAP 112* 139* 116*    Microbiology: Results for orders placed or performed during the hospital encounter of 09/10/15  MRSA PCR Screening     Status: None   Collection Time: 09/10/15  6:23 PM  Result Value Ref Range Status   MRSA by PCR NEGATIVE NEGATIVE Final    Comment:        The GeneXpert MRSA Assay (FDA approved for NASAL specimens only), is one component of a comprehensive MRSA colonization surveillance program. It is not intended to diagnose MRSA infection nor to guide or monitor treatment for MRSA infections.   Culture, blood (routine x 2)     Status: None (Preliminary result)   Collection Time: 09/10/15 11:25 PM  Result Value Ref Range Status   Specimen Description BLOOD LEFT ANTECUBITAL  Final   Special Requests BOTTLES DRAWN AEROBIC AND ANAEROBIC 10CC   Final   Culture NO GROWTH 4 DAYS  Final   Report Status PENDING  Incomplete  Culture, blood (routine x 2)     Status: None  (Preliminary result)   Collection Time: 09/10/15 11:30 PM  Result Value Ref Range Status   Specimen Description BLOOD RIGHT ARM  Final   Special Requests BOTTLES DRAWN AEROBIC AND ANAEROBIC 5CC  Final   Culture NO GROWTH 3 DAYS  Final   Report Status PENDING  Incomplete    Coagulation Studies:  Recent Labs  09/13/15 0347 09/14/15 0600 09/15/15 0640  LABPROT 25.5* 25.0* 20.2*  INR 2.36* 2.29* 1.73*    Urinalysis: No results for input(s): COLORURINE, LABSPEC, PHURINE, GLUCOSEU, HGBUR, BILIRUBINUR, KETONESUR, PROTEINUR, UROBILINOGEN, NITRITE, LEUKOCYTESUR in the last 72 hours.  Invalid input(s): APPERANCEUR    Imaging: Dg Abd Portable 1v  09/14/2015  CLINICAL DATA:  Small bowel obstruction. EXAM: PORTABLE ABDOMEN - 1 VIEW COMPARISON:  09/10/2015 FINDINGS: There are dilated bowel loops in the mid abdomen, consistent with the given history of small-bowel obstruction. There is a moderate amount of formed stool throughout the colon. Enteric catheter is again seen, with tip at the expected location of the pylorus. Rectal probe is also noted. Mixed lytic and sclerotic appearance of the left femoral head is again seen. Query avascular necrosis. IMPRESSION: Continued small bowel obstruction, with no significant improvement from the prior radiograph. Constipation. Stable positioning of the enteric catheter with tip at the expected location of the pylorus. Electronically Signed   By: Fidela Salisbury M.D.   On: 09/14/2015 09:09     Medications:   . dextrose 5 % with KCl 20 mEq / L 20 mEq (09/15/15 0900)  . Marland KitchenTPN (CLINIMIX-E) Adult 40 mL/hr at 09/14/15 1829   And  . fat emulsion 240 mL (09/14/15 1829)  . Marland KitchenTPN (CLINIMIX-E) Adult     And  . fat emulsion    .  sodium bicarbonate  infusion 1000 mL 100 mL/hr at 09/15/15 1000   . ampicillin-sulbactam (UNASYN) IV  3 g Intravenous Q8H  . antiseptic oral rinse  7 mL Mouth Rinse q12n4p  . chlorhexidine  15 mL Mouth Rinse BID  . insulin  aspart  0-9 Units Subcutaneous 4 times per day  . magnesium sulfate 1 - 4 g bolus IVPB  1 g Intravenous Once  . pantoprazole (PROTONIX) IV  40 mg Intravenous Q24H  . potassium chloride  10 mEq Intravenous Q1 Hr x 6  . potassium phosphate IVPB (mmol)  20 mmol Intravenous Once   HYDROmorphone (DILAUDID) injection  Assessment/  Plan:  1.Renal- acute renal failure likely due to previous sepsis and likely volume depletion. Renal function is now normal and Is making good urine.  2. Metabolic acidosis - is non-gap. We will supplement with IV bicarbonate. Puzzling probably due to renal failure. 3. Hypernatremia and hyperchloremia- likely secondary to a lot of normal saline given at the other hospital. Improved  4. Hypokalemia- agree with repletion Will need IV  5. Anemia - significant in nature. Possibly some due to HIV and recent significant hospitalization as well as acute kidney injury. Supportive care for now 6. High urine output  -- considering DDAVP for possible diabetes insipidus   -- this may be acceptable considering normal renal function and high output   LOS: 5 Wyman Meschke W @TODAY @10 :50 AM

## 2015-09-15 NOTE — Progress Notes (Addendum)
Physical Therapy Treatment Patient Details Name: [name] MRN: [id number] DOB: [birth year] Today's Date: 09/15/2015    History of Present Illness 54 yo M with hx of HIV+, R AKA due to DVT, CKD2, bilateral femoral head AVN.   Admitted in transfer from [facility] where he was admitted on 11-7 dark stools and hematemesis. He was found to be acidotic, hyponatremic, and required intubation. He was found to have bilateral pneumonia (possible aspiration), SBO as well as INR > 10.      PT Comments    Pt appeared agitated due to buttock pain and did not wish perform stand-pivot to chair. Pt continues to present with L LE weakness and required mod assist during transfer. Pt would benefit from skilled PT services for transfer cues and increase ambulation capacity to improve safety and functional independence.    Follow Up Recommendations  CIR     Equipment Recommendations    TBA   Recommendations for Other Services  N/A     Precautions / Restrictions Precautions Precautions: Fall Precaution Comments: NG tube Restrictions Weight Bearing Restrictions: No    Mobility  Bed Mobility Overal bed mobility: Needs Assistance Bed Mobility: Supine to Sit     Supine to sit: Mod assist     General bed mobility comments: increased time, cues for using hands to push to scoot to edge of bed, lifting assist for trunk  Transfers Overall transfer level: Needs assistance Equipment used: None (Discussed use of 2-wheeled walker would be better but pt refused) Transfers: Squat Pivot Transfers     Squat pivot transfers: Mod assist     General transfer comment: cues for hand placement and scooting to edge of bed for safety, pt refused 2 wheeled walker for transfer, pt only wanted to perform squat pivot to chair  Ambulation/Gait                 Stairs            Wheelchair Mobility    Modified Rankin (Stroke Patients Only)       Balance Overall balance assessment:  Needs assistance   Sitting balance-Leahy Scale: Fair         Standing balance comment: Pt did not perform complete stand so unable to assess                    Cognition Arousal/Alertness: Awake/alert Behavior During Therapy: WFL for tasks assessed/performed Overall Cognitive Status: Within Functional Limits for tasks assessed                      Exercises      General Comments        Pertinent Vitals/Pain Faces Pain Scale: Hurts even more Pain Location: buttocks, pt reported less pain after moving to chair Pain Descriptors / Indicators: Sore Pain Intervention(s): Limited activity within patient's tolerance;Monitored during session;Repositioned; Vital Signs Stable    Home Living                      Prior Function            PT Goals (current goals can now be found in the care plan section) Progress towards PT goals: PT to reassess next treatment    Frequency  Min 2X/week    PT Plan      Co-evaluation             End of Session Equipment Utilized During Treatment: Gait belt Activity  Tolerance: Patient limited by fatigue;Treatment limited secondary to agitation Patient left: in chair;with call bell/phone within reach     Time: 1041-1101 PT Time Calculation (min) (ACUTE ONLY): 20 min  Charges:  $Therapeutic Activity: 8-22 mins                    G CodesHaynes Bast Oct 14, 2015, 11:43 AM Haynes Bast, SPT October 14, 2015 11:43 AM I have read and agree with this progress note.   [place] [phone] (pager)

## 2015-09-15 NOTE — Progress Notes (Signed)
NGT pulled back 10cm per Claiborne Bill[place], [place] request. Will check residual at 1800.

## 2015-09-15 NOTE — Consult Note (Signed)
[name] 02/22/1961  [id number].   Primary Care MD: [facility] Requesting MD: Dr. Lita Mains Chief Complaint/Reason for Consult: sbo HPI: This is a 54 yo black male who appears older than states age with multiple medical problems who was admitted with PNA to [facility] over a week and a half ago.  He is a poor historian and so his history is not very complete.  It sounds like he was having N/V with some abdominal pain for about 4 days prior to his admission for PNA.  He required intubation at [facility].  He requested transfer to [facility] several days ago because he likes this hospital better.  He did have a CT scan on 09-07-15 that questioned a SBO or at least a PSBO.  He has had an NGT since then.  His most recent films yesterday show overall improvement, but with maybe 2 loops of bowel that are still a little dilated.  His NGT however, is likely past his pylorus.  His NGT has been around 2L a day.  He had 2 BMs yesterday that were solid and is passing flatus.  We have been asked to see him for further evaluation.  ROS : Please see HPI, otherwise difficult to obtain as he does not answer some of the questions I ask.  Family History  Problem Relation Age of Onset  . Arthritis Mother   . Kidney disease Maternal Grandfather     Past Medical History  Diagnosis Date  . History of chicken pox   . Hyperlipidemia   . History of DVT of lower extremity     right  . Left hip pain 03/09/2013  . Pneumonia   . Chronic kidney disease   . Protein malnutrition ([place]) 08/2015  . HIV (human immunodeficiency virus infection) [facility])     Past Surgical History  Procedure Laterality Date  . Appendectomy  1962  . Leg amputation above knee  2010    right leg    Social History:  reports that he has been smoking Cigarettes.  He has a 18 pack-year smoking history. He has never used smokeless tobacco. He reports that he does not drink alcohol or use illicit drugs.  Allergies:  Allergies   Allergen Reactions  . Dapsone     REACTION: fever  . Sulfamethoxazole-Trimethoprim     REACTION: fever    Medications Prior to Admission  Medication Sig Dispense Refill  . aspirin 81 MG tablet Take 81 mg by mouth daily.      Marland Kitchen ENSURE (ENSURE) Take 1 Can by mouth 3 (three) times daily between meals. 237 mL prn  . fenofibrate (TRICOR) 145 MG tablet Take 1 tablet (145 mg total) by mouth daily. 30 tablet 5  . gabapentin (NEURONTIN) 400 MG capsule TAKE ONE CAPSULE BY MOUTH FOUR TIMES DAILY 120 capsule 5  . morphine (MS CONTIN) 60 MG 12 hr tablet Take 2 tablets (120 mg total) by mouth every 12 (twelve) hours. 120 tablet 0  . oxyCODONE (OXY IR/ROXICODONE) 5 MG immediate release tablet Take 1 tablet (5 mg total) by mouth every 6 (six) hours as needed for severe pain. 60 tablet 0  . potassium chloride SA (K-DUR,KLOR-CON) 20 MEQ tablet TAKE 1 TABLET BY MOUTH EVERY DAY 30 tablet 3  . [name] 50 MG tablet TAKE 1 TABLET (50 MG TOTAL) BY MOUTH DAILY. 30 tablet 11  . [name] 300 MG tablet TAKE 1 TABLET (300 MG TOTAL) BY MOUTH DAILY. 30 tablet 11  . warfarin (COUMADIN)  5 MG tablet Take 2.5-5 mg by mouth daily. On Sunday Monday Wednesday Friday patient takes 2.5 mg and on all other days patient takes 5 mg.    . EVOTAZ 300-150 MG per tablet TAKE 1 TABLET BY MOUTH DAILY. SWALLOW WHOLE. DO NOT CRUSH, CUT OR CHEW TABLET. TAKE WITH FOOD. 30 tablet 11    Blood pressure 111/69, pulse 117, temperature 98.4 F (36.9 C), temperature source Oral, resp. rate 18, height 6' (1.829 m), weight 45.4 kg (100 lb 1.4 oz), SpO2 100 %. Physical Exam: General: older than stated age appearing black male who is laying in bed in NAD HEENT: head is normocephalic, atraumatic.  Sclera are noninjected.  PERRL.  Ears and nose without any masses or lesions, but he does have an NGT in his nose, clamped off currently Heart: regular rhythm, but tachycardic.  Normal s1,s2. No obvious murmurs, gallops, or rubs noted.  Palpable radial and  pedal pulses bilaterally Lungs: few rhonchi, but no wheezes or rales noted.  Respiratory effort nonlabored Abd: soft, NT, ND, +BS, no masses, hernias, or organomegaly, RLQ scar from appendectomy Skin: warm and dry with no masses, lesions, or rashes Psych: Alert, but poor historian    Results for orders placed or performed during the hospital encounter of 09/10/15 (from the past 48 hour(s))  Basic metabolic panel     Status: Abnormal   Collection Time: 09/13/15  6:15 PM  Result Value Ref Range   Sodium 148 (H) 135 - 145 mmol/L   Potassium 2.4 (LL) 3.5 - 5.1 mmol/L    Comment: CRITICAL RESULT CALLED TO, READ BACK BY AND VERIFIED WITH: C FRALEY,RN 1906 09/13/15 D BRADLEY    Chloride 126 (H) 101 - 111 mmol/L   CO2 15 (L) 22 - 32 mmol/L   Glucose, Bld 132 (H) 65 - 99 mg/dL   BUN 8 6 - 20 mg/dL   Creatinine, Ser 1.41 (H) 0.61 - 1.24 mg/dL   Calcium 7.2 (L) 8.9 - 10.3 mg/dL   GFR calc non Af Amer 55 (L) >60 mL/min   GFR calc Af Amer >60 >60 mL/min    Comment: (NOTE) The eGFR has been calculated using the CKD EPI equation. This calculation has not been validated in all clinical situations. eGFR's persistently <60 mL/min signify possible Chronic Kidney Disease.    Anion gap 7 5 - 15  Protime-INR     Status: Abnormal   Collection Time: 09/14/15  6:00 AM  Result Value Ref Range   Prothrombin Time 25.0 (H) 11.6 - 15.2 seconds   INR 2.29 (H) 0.00 - 1.49  Renal function panel     Status: Abnormal   Collection Time: 09/14/15  6:00 AM  Result Value Ref Range   Sodium 143 135 - 145 mmol/L   Potassium 2.5 (LL) 3.5 - 5.1 mmol/L    Comment: CRITICAL RESULT CALLED TO, READ BACK BY AND VERIFIED WITH: M.TIEL,RN 09/14/15 0650 BY BSLADE    Chloride 124 (H) 101 - 111 mmol/L   CO2 14 (L) 22 - 32 mmol/L   Glucose, Bld 128 (H) 65 - 99 mg/dL   BUN 5 (L) 6 - 20 mg/dL   Creatinine, Ser 1.24 0.61 - 1.24 mg/dL   Calcium 7.1 (L) 8.9 - 10.3 mg/dL   Phosphorus 1.4 (L) 2.5 - 4.6 mg/dL   Albumin 2.0 (L)  3.5 - 5.0 g/dL   GFR calc non Af Amer >60 >60 mL/min   GFR calc Af Amer >60 >60 mL/min    Comment: (  NOTE) The eGFR has been calculated using the CKD EPI equation. This calculation has not been validated in all clinical situations. eGFR's persistently <60 mL/min signify possible Chronic Kidney Disease.    Anion gap 5 5 - 15  Magnesium     Status: Abnormal   Collection Time: 09/14/15  6:00 AM  Result Value Ref Range   Magnesium 1.6 (L) 1.7 - 2.4 mg/dL  CBC     Status: Abnormal   Collection Time: 09/14/15  6:00 AM  Result Value Ref Range   WBC 16.9 (H) 4.0 - 10.5 K/uL   RBC 3.11 (L) 4.22 - 5.81 MIL/uL   Hemoglobin 8.9 (L) 13.0 - 17.0 g/dL   HCT 26.4 (L) 39.0 - 52.0 %   MCV 84.9 78.0 - 100.0 fL   MCH 28.6 26.0 - 34.0 pg   MCHC 33.7 30.0 - 36.0 g/dL   RDW 17.6 (H) 11.5 - 15.5 %   Platelets 228 150 - 400 K/uL  Hepatic function panel Once     Status: Abnormal   Collection Time: 09/14/15  6:00 AM  Result Value Ref Range   Total Protein 4.5 (L) 6.5 - 8.1 g/dL   Albumin 2.1 (L) 3.5 - 5.0 g/dL   AST 37 15 - 41 U/L   ALT 22 17 - 63 U/L   Alkaline Phosphatase 72 38 - 126 U/L   Total Bilirubin 0.9 0.3 - 1.2 mg/dL   Bilirubin, Direct 0.3 0.1 - 0.5 mg/dL   Indirect Bilirubin 0.6 0.3 - 0.9 mg/dL  Basic metabolic panel     Status: Abnormal   Collection Time: 09/14/15 11:05 AM  Result Value Ref Range   Sodium 141 135 - 145 mmol/L   Potassium 2.3 (LL) 3.5 - 5.1 mmol/L    Comment: CRITICAL RESULT CALLED TO, READ BACK BY AND VERIFIED WITH: WHITE,C RN 11.15.16 1142 MCADOO,G    Chloride 122 (H) 101 - 111 mmol/L   CO2 14 (L) 22 - 32 mmol/L   Glucose, Bld 149 (H) 65 - 99 mg/dL   BUN 6 6 - 20 mg/dL   Creatinine, Ser 1.20 0.61 - 1.24 mg/dL   Calcium 6.9 (L) 8.9 - 10.3 mg/dL   GFR calc non Af Amer >60 >60 mL/min   GFR calc Af Amer >60 >60 mL/min    Comment: (NOTE) The eGFR has been calculated using the CKD EPI equation. This calculation has not been validated in all clinical  situations. eGFR's persistently <60 mL/min signify possible Chronic Kidney Disease.    Anion gap 5 5 - 15  Glucose, capillary     Status: Abnormal   Collection Time: 09/14/15  5:45 PM  Result Value Ref Range   Glucose-Capillary 112 (H) 65 - 99 mg/dL  Basic metabolic panel     Status: Abnormal   Collection Time: 09/14/15  8:21 PM  Result Value Ref Range   Sodium 142 135 - 145 mmol/L   Potassium 2.6 (LL) 3.5 - 5.1 mmol/L    Comment: CRITICAL RESULT CALLED TO, READ BACK BY AND VERIFIED WITH: PIEL M,RN 09/14/15 2118 WAYK    Chloride 124 (H) 101 - 111 mmol/L   CO2 13 (L) 22 - 32 mmol/L   Glucose, Bld 127 (H) 65 - 99 mg/dL   BUN <5 (L) 6 - 20 mg/dL   Creatinine, Ser 1.19 0.61 - 1.24 mg/dL   Calcium 7.0 (L) 8.9 - 10.3 mg/dL   GFR calc non Af Amer >60 >60 mL/min   GFR calc Af Amer >  60 >60 mL/min    Comment: (NOTE) The eGFR has been calculated using the CKD EPI equation. This calculation has not been validated in all clinical situations. eGFR's persistently <60 mL/min signify possible Chronic Kidney Disease.    Anion gap 5 5 - 15  Glucose, capillary     Status: Abnormal   Collection Time: 09/14/15 11:34 PM  Result Value Ref Range   Glucose-Capillary 139 (H) 65 - 99 mg/dL  Protime-INR     Status: Abnormal   Collection Time: 09/15/15  6:40 AM  Result Value Ref Range   Prothrombin Time 20.2 (H) 11.6 - 15.2 seconds   INR 1.73 (H) 0.00 - 1.49  Comprehensive metabolic panel     Status: Abnormal   Collection Time: 09/15/15  6:40 AM  Result Value Ref Range   Sodium 146 (H) 135 - 145 mmol/L   Potassium 3.1 (L) 3.5 - 5.1 mmol/L   Chloride 127 (H) 101 - 111 mmol/L   CO2 13 (L) 22 - 32 mmol/L   Glucose, Bld 135 (H) 65 - 99 mg/dL   BUN 5 (L) 6 - 20 mg/dL   Creatinine, Ser 1.11 0.61 - 1.24 mg/dL   Calcium 7.2 (L) 8.9 - 10.3 mg/dL   Total Protein 4.7 (L) 6.5 - 8.1 g/dL   Albumin 2.1 (L) 3.5 - 5.0 g/dL   AST 28 15 - 41 U/L   ALT 20 17 - 63 U/L   Alkaline Phosphatase 77 38 - 126 U/L    Total Bilirubin 0.9 0.3 - 1.2 mg/dL   GFR calc non Af Amer >60 >60 mL/min   GFR calc Af Amer >60 >60 mL/min    Comment: (NOTE) The eGFR has been calculated using the CKD EPI equation. This calculation has not been validated in all clinical situations. eGFR's persistently <60 mL/min signify possible Chronic Kidney Disease.    Anion gap 6 5 - 15  Magnesium     Status: None   Collection Time: 09/15/15  6:40 AM  Result Value Ref Range   Magnesium 1.8 1.7 - 2.4 mg/dL  Phosphorus     Status: Abnormal   Collection Time: 09/15/15  6:40 AM  Result Value Ref Range   Phosphorus 1.9 (L) 2.5 - 4.6 mg/dL  Prealbumin     Status: Abnormal   Collection Time: 09/15/15  6:40 AM  Result Value Ref Range   Prealbumin 10.6 (L) 18 - 38 mg/dL  Triglycerides     Status: None   Collection Time: 09/15/15  6:40 AM  Result Value Ref Range   Triglycerides 100 <150 mg/dL  Glucose, capillary     Status: Abnormal   Collection Time: 09/15/15  6:44 AM  Result Value Ref Range   Glucose-Capillary 116 (H) 65 - 99 mg/dL   Dg Abd Portable 1v  09/14/2015  CLINICAL DATA:  Small bowel obstruction. EXAM: PORTABLE ABDOMEN - 1 VIEW COMPARISON:  09/10/2015 FINDINGS: There are dilated bowel loops in the mid abdomen, consistent with the given history of small-bowel obstruction. There is a moderate amount of formed stool throughout the colon. Enteric catheter is again seen, with tip at the expected location of the pylorus. Rectal probe is also noted. Mixed lytic and sclerotic appearance of the left femoral head is again seen. Query avascular necrosis. IMPRESSION: Continued small bowel obstruction, with no significant improvement from the prior radiograph. Constipation. Stable positioning of the enteric catheter with tip at the expected location of the pylorus. Electronically Signed   By: Linwood Dibbles.D.  On: 09/14/2015 09:09       Assessment/Plan 1. sbo -patient did appear to have a SBO on his original CT scan and  likely had a bowel obstruction pre-admission and developed aspiration PNA from vomiting.  His most recent abdominal films do appear improved.  He is passing flatus and had 2 formed BMs yesterday.  His films do show quite a bit of stool throughout his colon.  Will give a SMOG enema to help clear this out.  His NGT output has been high, but it has been past his pylorus likely in the duodenum.  This causes high output regardless of whether the patient has a bowel obstruction or not.  Will pull NGT back about 10cm.  The primary service has written for clamping trials.  Hopefully once his NGT is pulled back his residuals will be minimal and his NGT can be removed soon.  No immediate need for surgical intervention.  Will treat conservatively.  We will follow.  Kailena Lubas E 09/15/2015, 2:11 PM Pager: [phone]

## 2015-09-15 NOTE — Progress Notes (Signed)
Daily Rounding Note  09/15/2015, 8:07 AM  LOS: 5 days   SUBJECTIVE:       1.7 liters NGT output.  Still c/o up to 5/10 abd pain around mid abdomen.  Had 2 stools yesterday.  .   OBJECTIVE:         Vital signs in last 24 hours:    Temp:  [98 F (36.7 C)-99.9 F (37.7 C)] 99.8 F (37.7 C) (11/16 0400) Pulse Rate:  [89-113] 89 (11/16 0600) Resp:  [20-34] 21 (11/16 0600) BP: (100-136)/(64-76) 111/65 mmHg (11/16 0600) SpO2:  [97 %-99 %] 99 % (11/16 0600) Weight:  [45.4 kg (100 lb 1.4 oz)] 45.4 kg (100 lb 1.4 oz) (11/16 0500) Last BM Date: 09/14/15 Filed Weights   09/13/15 0500 09/14/15 0500 09/15/15 0500  Weight: 46.222 kg (101 lb 14.4 oz) 48.898 kg (107 lb 12.8 oz) 45.4 kg (100 lb 1.4 oz)   General: more alert, still looks    Heart: RRR Chest: clear bil.  No labored breathing Abdomen: quiet, moderately tense, mild non-focal tenderness  Extremities: wasting of muscles in arms legs.  S/p AKA Neuro/Psych:  Appropriate, speech less slurred.  Moves all 4s.   Intake/Output from previous day: 11/15 0701 - 11/16 0700 In: 3275.8 [I.V.:2400; IV Piggyback:250; TPN:625.8] Out: 9575 [Urine:7800; Emesis/NG output:1775]  Intake/Output this shift:    Lab Results:  Recent Labs  09/12/15 1729 09/13/15 0347 09/14/15 0600  WBC 15.6* 14.4* 16.9*  HGB 8.6* 8.2* 8.9*  HCT 25.4* 24.6* 26.4*  PLT 207 217 228   BMET  Recent Labs  09/14/15 1105 09/14/15 2021 09/15/15 0640  NA 141 142 146*  K 2.3* 2.6* 3.1*  CL 122* 124* 127*  CO2 14* 13* 13*  GLUCOSE 149* 127* 135*  BUN 6 <5* 5*  CREATININE 1.20 1.19 1.11  CALCIUM 6.9* 7.0* 7.2*   LFT  Recent Labs  09/13/15 0348 09/14/15 0600 09/15/15 0640  PROT  --  4.5* 4.7*  ALBUMIN 2.0* 2.1*  2.0* 2.1*  AST  --  37 28  ALT  --  22 20  ALKPHOS  --  72 77  BILITOT  --  0.9 0.9  BILIDIR  --  0.3  --   IBILI  --  0.6  --    PT/INR  Recent Labs  09/14/15 0600  09/15/15 0640  LABPROT 25.0* 20.2*  INR 2.29* 1.73*   Hepatitis Panel No results for input(s): HEPBSAG, HCVAB, HEPAIGM, HEPBIGM in the last 72 hours.  Studies/Results: Dg Abd Portable 1v  09/14/2015  CLINICAL DATA:  Small bowel obstruction. EXAM: PORTABLE ABDOMEN - 1 VIEW COMPARISON:  09/10/2015 FINDINGS: There are dilated bowel loops in the mid abdomen, consistent with the given history of small-bowel obstruction. There is a moderate amount of formed stool throughout the colon. Enteric catheter is again seen, with tip at the expected location of the pylorus. Rectal probe is also noted. Mixed lytic and sclerotic appearance of the left femoral head is again seen. Query avascular necrosis. IMPRESSION: Continued small bowel obstruction, with no significant improvement from the prior radiograph. Constipation. Stable positioning of the enteric catheter with tip at the expected location of the pylorus. Electronically Signed   By: Fidela Salisbury M.D.   On: 09/14/2015 09:09   Scheduled Meds: . ampicillin-sulbactam (UNASYN) IV  3 g Intravenous Q8H  . antiseptic oral rinse  7 mL Mouth Rinse q12n4p  . chlorhexidine  15 mL Mouth Rinse BID  .  insulin aspart  0-9 Units Subcutaneous 4 times per day  . pantoprazole (PROTONIX) IV  40 mg Intravenous Q24H   Continuous Infusions: . dextrose 5 % with KCl 20 mEq / L 20 mEq (09/15/15 0634)  . Marland KitchenTPN (CLINIMIX-E) Adult 40 mL/hr at 09/14/15 1829   And  . fat emulsion 240 mL (09/14/15 1829)  .  sodium bicarbonate  infusion 1000 mL 75 mL/hr at 09/15/15 0635   PRN Meds:.HYDROmorphone (DILAUDID) injection   ASSESMENT:   * PSBO. persists from at least 11/8 at [facility].    * resolved mild elevation LFTs. GB sludge, fatty liver vs hepatocellular disease. Low GB EF on HIDA.   *  Protein malnutrition. TNA started PM 11/15.   * Coumadin (currently on hold) for hx DVT leading to LE amputation.  Coags remain elevated (though now on downward trend).  No  Coumadin for at least 8 days. ? Some of this due to hepatic dysfunction?   * AKI. resolved. Electrolyte derangements improved.   * Stable anemia. Normocytic.   * bil PNA. Klebsiella grew on 11/7 blood clx. On Unasyn   PLAN   *  gen surgery consult for SBO.     Azucena Freed  09/15/2015, 8:07 AM Pager: [phone]   Attending physician's note   I have taken an interval history, reviewed the chart and examined the patient. I agree with the Advanced Practitioner's note, impression and recommendations.  Persistent ileus, no significant improvement.  Started on TPN yesterday Continue NG tube to low intermittent suction Ambulate out of bed Replete electrolytes Will sign off, available for any questions  K Denzil Magnuson, MD [phone] Mon-Fri 8a-5p [phone] after 5p, weekends, holidays

## 2015-09-15 NOTE — Progress Notes (Signed)
[facility] NOTE   Pharmacy Consult for TPN Indication: prolonged ileus  Allergies  Allergen Reactions  . Dapsone     REACTION: fever  . Sulfamethoxazole-Trimethoprim     REACTION: fever    Patient Measurements: Height: 6' (182.9 cm) Weight: 100 lb 1.4 oz (45.4 kg) IBW/kg (Calculated) : 77.6   Vital Signs: Temp: 99.1 F (37.3 C) (11/16 0800) Temp Source: Oral (11/16 0800) BP: 104/67 mmHg (11/16 0800) Pulse Rate: 99 (11/16 0800) Intake/Output from previous day: 11/15 0701 - 11/16 0700 In: 3275.8 [I.V.:2400; IV Piggyback:250; TPN:625.8] Out: 9575 [Urine:7800; Emesis/NG output:1775] Intake/Output from this shift: Total I/O In: 275 [I.V.:175; IV Piggyback:50; TPN:50] Out: 225 [Urine:225]  Labs:  Recent Labs  09/12/15 1729 09/13/15 0347 09/14/15 0600 09/15/15 0640  WBC 15.6* 14.4* 16.9*  --   HGB 8.6* 8.2* 8.9*  --   HCT 25.4* 24.6* 26.4*  --   PLT 207 217 228  --   INR  --  2.36* 2.29* 1.73*     Recent Labs  09/13/15 0348 09/13/15 0850  09/14/15 0600 09/14/15 1105 09/14/15 2021 09/15/15 0640  NA 152*  --   < > 143 141 142 146*  K 2.5*  --   < > 2.5* 2.3* 2.6* 3.1*  CL >130*  --   < > 124* 122* 124* 127*  CO2 15*  --   < > 14* 14* 13* 13*  GLUCOSE 196*  --   < > 128* 149* 127* 135*  BUN 11  --   < > 5* 6 <5* 5*  CREATININE 1.53*  --   < > 1.24 1.20 1.19 1.11  LABCREA  --  32.36  --   --   --   --   --   CALCIUM 7.1*  --   < > 7.1* 6.9* 7.0* 7.2*  MG  --   --   --  1.6*  --   --  1.8  PHOS 1.8*  --   --  1.4*  --   --  1.9*  PROT  --   --   --  4.5*  --   --  4.7*  ALBUMIN 2.0*  --   --  2.1*  2.0*  --   --  2.1*  AST  --   --   --  37  --   --  28  ALT  --   --   --  22  --   --  20  ALKPHOS  --   --   --  72  --   --  77  BILITOT  --   --   --  0.9  --   --  0.9  BILIDIR  --   --   --  0.3  --   --   --   IBILI  --   --   --  0.6  --   --   --   PREALBUMIN  --   --   --   --   --   --  10.6*  TRIG  --   --   --   --   --   --   100  < > = values in this interval not displayed. Estimated Creatinine Clearance: 48.9 mL/min (by C-G formula based on Cr of 1.11).    Recent Labs  09/14/15 1745 09/14/15 2334 09/15/15 0644  GLUCAP 112* 139* 116*     Insulin Requirements in the past  24 hours:  1 unit SSI Current Nutrition:  NPO Clinimix E 5/15 at 40 ml/h + IVFE at 10 ml/hr Assessment: 54 yo man with pSBO.  Pharmacy consulted to dose TPN for nutrition support. GI: [facility] 1775 yest.  Lytes: Has had severely depleted electrolytes despite daily repletion; K 3.1 20 mM kphos ( 29 meq K) , 6 runs K and 20 meq K over 4 hours = 110 meq total K supplementation; Phos 1.9 20 mM kphos ordered, mag 1.8,  Na 146,  Cl 127,  IVF D5 w150 bicarb at 75 and D520 K at 250 ml/hr x 4 hrs. Renal: ARF due to previous sepsis and likely volume depletion. Renal fxn now normal and good UOP, metabolic acidosis, non-gap, IV bicarb.  Hepatic: alb 2. LFTs OK.  Endo: no hx DM.  All CBGs < 150 on TPN at 40 ml/hr Infectious Disease  54 y/o M transfer from [facility], had been on Merrem/Fluconazole, had been in acute renal failure but Scr has trended down to 1.2 (~7 originally at [facility]), WBC 16.9l  Bilateral PNA, klebsiella bacteremia  Total abx D #10   11/12 Unasyn>> 11/11 Bc>>ngtd HIV+ Continue to hold ART while NPO He has previously been well controlled.   [id number] pending  Anticoagulation-  Coumadin pta for unprovoked DVT that led to R AKA, on hold: INR 10 at [facility], INR 1.83 >>peak 2.36>>1.73,at [facility],  hematemsis, Hg 8.9 stable, baseline >13. UGIB resolved. IV PPI IV access: triple lumen CVC in R sublavian TPN start date:  111/5  Nutritional Goals: per RD 11/14 1600-1800  kCal, 80-90 grams of protein per day  Plan: -will not advance TPN rate until electrolytes are replaced and stable - continue Clinimix E 5/15 at 40 ml/h + IVFE at 10 ml/hr to provide 48 gm protein and 1162 kcals -  Continue empiric SSI/CBGs - will DC at goal rate if not  needed -continue MVI and trace elements in TPN - will order mag 1 gm bolus - MD has ordered: kphos 20 mM (29 meq K), 6 k runs, K 20 meq over 4 hours,  This will provide 110 meq K and 20 mM phos - TPN labs in am -IVF per MD, has D5 w/ 150 bicarb at 150 ml/hr  Eudelia Bunch, Pharm.D. [id number] 09/15/2015 10:15 AM

## 2015-09-15 NOTE — Progress Notes (Signed)
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 He was found to be acidotic, hyponatremic, and required intubation. He was found to have bilateral pneumonia (possible aspiration), SBO as well as INR > 10. He was transfused both blood and FFP. He was started on vanco/imipenem/levaquin. His Bcx were positive for klebsiella.  RD consulted for new TPN.  Patient continues with NPO status.  NGT to LIS.  + abdominal pain.  General Surgery consulted for SBO.  Patient is receiving TPN via triple lumen CVC with Clinimix E 5/15 @ 40 ml/hr and lipids @ 10 ml/hr. Provides 1162 kcal and 48 grams protein per day. Meets 73% minimum estimated energy needs and 60% minimum estimated protein needs.  Diet Order:  Diet NPO time specified TPN (CLINIMIX-E) Adult TPN (CLINIMIX-E) Adult  Skin:  Wound (see comment) (Stage II to coccyx)  Last BM:  11/15  Height:   Ht Readings from Last 1 Encounters:  09/10/15 6' (1.829 m)    Weight:   Wt Readings from Last 1 Encounters:  09/15/15 100 lb 1.4 oz (45.4 kg)    Ideal Body Weight:  81 kg  BMI:  Body mass index is 13.57 kg/(m^2).  Estimated Nutritional Needs:   Kcal:  1600-1800  Protein:  80-90 gm  Fluid:  1.6-1.8 L  EDUCATION NEEDS:   No education needs identified at this time  Arthur Holms, RD, LDN Pager #: [phone] After-Hours Pager #: [phone]

## 2015-09-15 NOTE — Progress Notes (Signed)
Subjective: Patient was seen and examined at bedside today. Still having significant dark NGT output (1.7 L in the past 24 hrs) and complaining of some diffuse abdominal pain. Patient states he is still having bowel movements and passing flatus. Denies having any CP or SOB. No other complaints.     Objective: Vital signs in last 24 hours: Filed Vitals:   09/15/15 1239 09/15/15 1400 09/15/15 1600 09/15/15 1638  BP: 111/69 1'06/58 95/57 95/57 '  Pulse: 117  81 88  Temp: 98.4 F (36.9 C)   99.6 F (37.6 C)  TempSrc: Oral   Oral  Resp: '18 25 19 20  ' Height:      Weight:      SpO2: 100%  100% 100%   Weight change: -7 lb 11.4 oz (-3.498 kg)  Intake/Output Summary (Last 24 hours) at 09/15/15 1732 Last data filed at 09/15/15 1700  Gross per 24 hour  Intake 6402.84 ml  Output  11675 ml  Net -5272.16 ml   Physical Exam  Constitutional: He is oriented to person, place, and time. No distress.  Cachectic   Cardiovascular: Normal rate, regular rhythm and intact distal pulses.  Exam reveals no gallop and no friction rub.   No murmur heard. Pulmonary/Chest: Effort normal. No respiratory distress. He has no wheezes. He has no rales.  Abdominal: He exhibits no distension. There is tenderness. There is no rebound and no guarding.  Hypoactive bowel sounds. Diffuse mild TTP. Has dark NGT output.   Musculoskeletal:  R AKA stump well healed No LLE edema  Neurological: He is alert and oriented to person, place, and time.  Skin: Skin is warm and dry.   Lab Results: Basic Metabolic Panel:  Recent Labs Lab 09/14/15 0600  09/14/15 2021 09/15/15 0640  NA 143  < > 142 146*  K 2.5*  < > 2.6* 3.1*  CL 124*  < > 124* 127*  CO2 14*  < > 13* 13*  GLUCOSE 128*  < > 127* 135*  BUN 5*  < > <5* 5*  CREATININE 1.24  < > 1.19 1.11  CALCIUM 7.1*  < > 7.0* 7.2*  MG 1.6*  --   --  1.8  PHOS 1.4*  --   --  1.9*  < > = values in this interval not displayed. Liver Function Tests:  Recent Labs Lab  09/14/15 0600 09/15/15 0640  AST 37 28  ALT 22 20  ALKPHOS 72 77  BILITOT 0.9 0.9  PROT 4.5* 4.7*  ALBUMIN 2.1*  2.0* 2.1*   CBC:  Recent Labs Lab 09/12/15 1729 09/13/15 0347 09/14/15 0600  WBC 15.6* 14.4* 16.9*  NEUTROABS 12.5* 11.5*  --   HGB 8.6* 8.2* 8.9*  HCT 25.4* 24.6* 26.4*  MCV 84.1 85.1 84.9  PLT 207 217 228   Coagulation:  Recent Labs Lab 09/12/15 0256 09/13/15 0347 09/14/15 0600 09/15/15 0640  LABPROT 24.8* 25.5* 25.0* 20.2*  INR 2.26* 2.36* 2.29* 1.73*   Urine Drug Screen: Drugs of Abuse     Component Value Date/Time   LABOPIA * 06/23/2009 1831    POSITIVE (NOTE) Result repeated and verified. Sent for confirmatory testing   COCAINSCRNUR NEGATIVE 06/23/2009 1831   LABBENZ NEGATIVE 06/23/2009 1831   AMPHETMU NEGATIVE 06/23/2009 1831    Micro Results: Recent Results (from the past 240 hour(s))  MRSA PCR Screening     Status: None   Collection Time: 09/10/15  6:23 PM  Result Value Ref Range Status   MRSA by PCR  NEGATIVE NEGATIVE Final    Comment:        The GeneXpert MRSA Assay (FDA approved for NASAL specimens only), is one component of a comprehensive MRSA colonization surveillance program. It is not intended to diagnose MRSA infection nor to guide or monitor treatment for MRSA infections.   Culture, blood (routine x 2)     Status: None   Collection Time: 09/10/15 11:25 PM  Result Value Ref Range Status   Specimen Description BLOOD LEFT ANTECUBITAL  Final   Special Requests BOTTLES DRAWN AEROBIC AND ANAEROBIC 10CC   Final   Culture NO GROWTH 5 DAYS  Final   Report Status 09/15/2015 FINAL  Final  Culture, blood (routine x 2)     Status: None (Preliminary result)   Collection Time: 09/10/15 11:30 PM  Result Value Ref Range Status   Specimen Description BLOOD RIGHT ARM  Final   Special Requests BOTTLES DRAWN AEROBIC AND ANAEROBIC 5CC  Final   Culture NO GROWTH 4 DAYS  Final   Report Status PENDING  Incomplete    Studies/Results: Dg Abd Portable 1v  09/14/2015  CLINICAL DATA:  Small bowel obstruction. EXAM: PORTABLE ABDOMEN - 1 VIEW COMPARISON:  09/10/2015 FINDINGS: There are dilated bowel loops in the mid abdomen, consistent with the given history of small-bowel obstruction. There is a moderate amount of formed stool throughout the colon. Enteric catheter is again seen, with tip at the expected location of the pylorus. Rectal probe is also noted. Mixed lytic and sclerotic appearance of the left femoral head is again seen. Query avascular necrosis. IMPRESSION: Continued small bowel obstruction, with no significant improvement from the prior radiograph. Constipation. Stable positioning of the enteric catheter with tip at the expected location of the pylorus. Electronically Signed   By: Fidela Salisbury M.D.   On: 09/14/2015 09:09   Medications: I have reviewed the patient's current medications. Scheduled Meds: . ampicillin-sulbactam (UNASYN) IV  3 g Intravenous Q8H  . antiseptic oral rinse  7 mL Mouth Rinse q12n4p  . chlorhexidine  15 mL Mouth Rinse BID  . insulin aspart  0-9 Units Subcutaneous 4 times per day  . pantoprazole (PROTONIX) IV  40 mg Intravenous Q24H   Continuous Infusions: . Marland KitchenTPN (CLINIMIX-E) Adult 40 mL/hr at 09/15/15 1200   And  . fat emulsion 240 mL (09/15/15 1200)  . Marland KitchenTPN (CLINIMIX-E) Adult     And  . fat emulsion    .  sodium bicarbonate  infusion 1000 mL 100 mL/hr at 09/15/15 1200   PRN Meds:.HYDROmorphone (DILAUDID) injection Assessment/Plan: Active Problems:   Human immunodeficiency virus (HIV) disease (HCC)   Alcohol abuse   Status post above knee amputation ([place])   Long term current use of anticoagulant   Avascular necrosis of femoral head (HCC)   Septic shock (HCC)   Gram-negative bacteremia (HCC)   Abdominal pain   Pneumonia due to Klebsiella pneumoniae (HCC)   Pressure ulcer   Protein-calorie malnutrition, severe  Septic shock 2/2 to Klebsiella pneumoniae  Bacteremia: likely from GI (aspiratin) vs lung improving. Was on pressors at [facility]. Imaging shows bilateral pneumonia has improved since 11/8. Afebrile currently. Blood culture showing no growth in 5 days. Patient is tachypnic (RR in 21-26) but satting 97-100% on RA. BP stable this morning.  - Unasyn 3g q8h IV for coverage of Klebsiella and possibly anaerobes  Partial SBO: Abdominal X-ray showing partial SBO, improved since 11/8. The patient takes scheduled MS contin at home 60 mg x 12h with oxycodone  IR 5 mg for breakthrough, which may have caused his SBO. In addition, iron released from recent GI bleed could have also caused constipation. Gallstone ileus is also on the differential. Has hypoactive BS, abdomen mildly TTP. Patient reports having bowel movements and passing flatus. He continues to have dark fluid from NG tube (1.7L in the past 24 hrs). Surgery saw the patient today and discovered the NG tube was past his pylorus likely in the duodenum (which would explain the high NGT output). They pulled the tube back about 10 cm.  - NGT clamping trials initiated. Since NGT has now been pulled back, if residuals are minimal it can be be removed soon.  - Hydromorphone 1 mg q4h prn - D5 fluid - TPN initiated   Hematemesis: Unclear source of upper GI bleed. Hgb 8.9, with baseline over 13. Likely in the setting of elevated INR during previous hospitalization. INR 1.73 now. Dark fluid from NG tube (1.7L in the past 24 hrs).  - Protonix IV 40 mg - trend cbc - Appreciate GI and surgery recs  GB dysfunction: cholestasis, EF 27% per HIDA.  - GI has been consulted, appreciate recs  High INR without warfarin - with History of Unprovoked DVT:  On anticoagulation indefinitely. Patient has consistently been supratherapeutic at anti-coag visits and presented to 10 at [facility].  - Holding warfarin but patient is auto-anticoagulation likely 2/2 to sepsis causing synthetic dysfunction of liver.  -  cont monitoring cbc and INR  Acute on Chronic Kidney Disease - likely 2/2 to ATN: Baseline creatinine of ~1.2. Was in 7's at outside hospital, now improving to 1.11. Has good UOP.  - continue to monitor. Cont fluids.   Non gap Metabolic Acidosis: AG is resolved as no longer has lactic acidosis but persistently has low bicarb (13 today).  - appreciate nephro recs. Continue IV bicarb.  Hypokalemia and hypoMag - K 3.1 this AM.  -IV KCl 10 meq q1 x6 + replete Mag as needed   Hypophosphatemia - Phosphorous 1.9 today. -Potassium phosphate 20 mmol IV  Hypernatremia - likely 2/2 to free water deficit Received Normal Saline extensively before transferring to [place]. Na improved to 146 today.  -Continue D5 to replete free water deficit and also to help with hypernatremia.  High urine output: patient continues to have high urine output - 7.8 L in the past 24 hours.  -Dose of DDAVP given today for possible diabetes insipidus    HIV: CD4 decreased from 700 to 219 in a matter of 2 weeks, likely in the setting of acute illness. Viral load <20 in 07/2015.    - Restart anti-retroviral regimen after NPO status -F/u HLA B-57 (01)  Elevated AST: To 62. Likely downtrending from previous septic shock, but could represent underlying liver dysfunction driving up his INR. AST downtrended to 28 (normal).   Stage II pressure ulcer -Foam dressing as recommended by wound care   Avascular Necrosis of Hip and Phantom Limb Pain: Pain management per above.   DVT Prophylaxis: SCDs Code Status: Full Admit to: Step down  Dispo: Disposition is deferred at this time, awaiting improvement of current medical problems.  Anticipated discharge in approximately 2-3 day(s).   The patient does have a current PCP Iline Oven, MD) and does need an [facility] hospital follow-up appointment after discharge.  The patient does not have transportation limitations that hinder transportation to clinic appointments.  .Services  Needed at time of discharge: Y = Yes, Blank = No PT:   OT:  RN:   Equipment:   Other:     LOS: 5 days   Shela Leff, MD 09/15/2015, 5:32 PM

## 2015-09-15 NOTE — Progress Notes (Signed)
Patient ID: [name], male   DOB: 04/21/1961, 54 y.o.   MRN: [id number] Medicine attending: I personally examined this patient this morning together with resident physician Shela Leff and we discussed a management plan. We appreciate ongoing assistance from nephrology, gastroenterology, and general surgery. The patient remains critically ill. #1. Resolving Klebsiella pneumonia with associated bacteremia. He continues on Unasyn. Cervical cultures nonnegative. #2. Persistent ileus versus small bowel obstruction. He is passing small amounts of stool. No bowel sounds. Abdomen remains distended and tympanitic. Voluminous NG output exacerbating ongoing electrolyte abnormalities. We are going to try to clamp his NG tube and check for residuals. We will pull back the NG tube which is currently malpositioned in the duodenum as advised by surgery. Continue conservative treatment. #3. Acute kidney injury Renal function now back to normal. #4. Persistent hypokalemia Ongoing potassium replacement. Hopefully by repeat positioning his NG tube and clamping can off we can minimize further electrolyte losses. #5. Hypernatremia/high urine output Following discussion with nephrology, we are going to give him a dose of DDAVP for the antidiuretic effect. #6. Severe protein calorie malnutrition TPN started #7. HIV AIDS holding antiretrovirals until he is able to take by mouth #8. Hypomagnesemia, hypophosphatemia Continue intermittent replacement #9. Non-anion gap metabolic acidosis Gap now closed but he is still receiving parenteral bicarbonate. Hopefully we will be able to taper and stop this with normalization of his renal function and sepsis.

## 2015-09-15 NOTE — Progress Notes (Signed)
CRITICAL VALUE ALERT  Critical value received: K+ 2.6  Date of notification: 09/14/15  Time of notification: 2300  Critical value read back: yes  Nurse who received alert:  Melody Comas RN  MD notified (1st page):  Ysidro Evert, MD  Time of first page:    MD notified (2nd page):  Time of second page:  Responding MD:  Time MD responded:   New orders received. Melody Comas, RN

## 2015-09-16 LAB — CBC
HCT: 23.3 % — ABNORMAL LOW (ref 39.0–52.0)
Hemoglobin: 7.7 g/dL — ABNORMAL LOW (ref 13.0–17.0)
MCH: 28.4 pg (ref 26.0–34.0)
MCHC: 33 g/dL (ref 30.0–36.0)
MCV: 86 fL (ref 78.0–100.0)
PLATELETS: 255 10*3/uL (ref 150–400)
RBC: 2.71 MIL/uL — ABNORMAL LOW (ref 4.22–5.81)
RDW: 17.8 % — AB (ref 11.5–15.5)
WBC: 16.3 10*3/uL — ABNORMAL HIGH (ref 4.0–10.5)

## 2015-09-16 LAB — COMPREHENSIVE METABOLIC PANEL
ALBUMIN: 2.4 g/dL — AB (ref 3.5–5.0)
ALK PHOS: 86 U/L (ref 38–126)
ALT: 24 U/L (ref 17–63)
ANION GAP: 6 (ref 5–15)
AST: 40 U/L (ref 15–41)
BILIRUBIN TOTAL: 0.8 mg/dL (ref 0.3–1.2)
BUN: 6 mg/dL (ref 6–20)
CALCIUM: 7.4 mg/dL — AB (ref 8.9–10.3)
CO2: 11 mmol/L — ABNORMAL LOW (ref 22–32)
Chloride: 127 mmol/L — ABNORMAL HIGH (ref 101–111)
Creatinine, Ser: 1.11 mg/dL (ref 0.61–1.24)
GFR calc Af Amer: >60 mL/min (ref >60)
GLUCOSE: 97 mg/dL (ref 65–99)
POTASSIUM: 3 mmol/L — AB (ref 3.5–5.1)
Sodium: 144 mmol/L (ref 135–145)
TOTAL PROTEIN: 5.2 g/dL — AB (ref 6.5–8.1)

## 2015-09-16 LAB — PROTIME-INR
INR: 1.33 (ref 0.00–1.49)
PROTHROMBIN TIME: 16.6 s — AB (ref 11.6–15.2)

## 2015-09-16 LAB — CULTURE, BLOOD (ROUTINE X 2): CULTURE: NO GROWTH

## 2015-09-16 LAB — GLUCOSE, CAPILLARY: GLUCOSE-CAPILLARY: 148 mg/dL — AB (ref 65–99)

## 2015-09-16 LAB — PHOSPHORUS: Phosphorus: 2.1 mg/dL — ABNORMAL LOW (ref 2.5–4.6)

## 2015-09-16 LAB — MAGNESIUM: MAGNESIUM: 1.4 mg/dL — AB (ref 1.7–2.4)

## 2015-09-16 MED ORDER — POTASSIUM CHLORIDE 10 MEQ/50ML IV SOLN: 10.0000 meq | INTRAVENOUS | Status: DC

## 2015-09-16 MED ORDER — POTASSIUM CHLORIDE 10 MEQ/50ML IV SOLN
10.0000 meq | INTRAVENOUS | Status: AC
  Administered 2015-09-16 – 2015-09-17 (×6): 10 meq via INTRAVENOUS
  Filled 2015-09-16 (×6): qty 50

## 2015-09-16 MED ORDER — POTASSIUM CL IN DEXTROSE 5% 20 MEQ/L IV SOLN
20.0000 meq | INTRAVENOUS | Status: AC
  Administered 2015-09-16: 20 meq via INTRAVENOUS
  Filled 2015-09-16 (×2): qty 1000

## 2015-09-16 NOTE — Progress Notes (Signed)
Patient ID: [name], male   DOB: [birth year], 54 y.o.   MRN: [id number] Medicine attending: Patient examined together with the medical team. He pulled out his NG tube and intravenous lines. He is refusing medications. He is referred using blood samples. He did have a good response to an enema given yesterday. Abdomen is totally decompressed, soft, nontender, nontympanitic this morning. Bowel sounds still absent. Right lower extremity amputation noted. (Chronic finding). We are still seeing significant post ATN diuresis of about 6 L. As of November 16, renal function now normal. Sodium in upper normal range. Serum bicarbonate still low but no anion gap. Until he pulled out his IV, he was still getting IV bicarbonate. He has now had a total of 10 days of antibiotics. Antibiotics initially started in another hospital on admission there on November 7. We can probably stop all antibiotics at this point. He is frustrated and withdrawn having been in the hospital for so long. We tried to reassure him that he is making steady progress. At this point we need to concentrate on his nutrition. One of his daughters was here today. We reviewed his status and the management plan with her in the presence of her father. He will need to be placed in a extended care facility when he is otherwise stable for discharge. We touched base with the care manager.

## 2015-09-16 NOTE — Progress Notes (Signed)
In room pt trying to get oob; gown off. Stool noted on bed sheets. This nurse and tech attempted to change bed and put on gown and pt refused; daughter in room. Security called and in to assist. Pt yelling for for [place] not to touch him. Pt cleaned up and bed changed with assistance by security and tech. [employer] at the bedside. Offered patient juice to drink and he says he didn't want anything that we had.

## 2015-09-16 NOTE — Progress Notes (Signed)
Subjective: Patient pulled out his NGT and IV lines overnight. He is refusing medications. Refusing any blood work. He seemed withdrawn and agitated this morning and did want not answer any of my questions. Daughter was present in the room and said her father mentioned he was tired of undergoing tests.  Objective: Vital signs in last 24 hours: Filed Vitals:   09/16/15 0200 09/16/15 0400 09/16/15 0737 09/16/15 1110  BP: 129/67 147/105 126/73 115/66  Pulse:   91 100  Temp:   98.7 F (37.1 C) 98.7 F (37.1 C)  TempSrc:   Oral Oral  Resp: 32 42 30 26  Height:      Weight:      SpO2:   100% 96%   Weight change:   Intake/Output Summary (Last 24 hours) at 09/16/15 1511 Last data filed at 09/16/15 1429  Gross per 24 hour  Intake 3642.5 ml  Output   6660 ml  Net -3017.5 ml   Physical Exam  Constitutional: He is oriented to person, place, and time. No distress.  Cachectic   Cardiovascular: Normal rate, regular rhythm and intact distal pulses.  Exam reveals no gallop and no friction rub.   No murmur heard. Pulmonary/Chest: Effort normal. No respiratory distress. He has no wheezes. He has no rales.  Abdominal: He exhibits no distension. There is no tenderness. There is no rebound and no guarding.  No bowel sounds appreciated.   Musculoskeletal:  R AKA stump well healed No LLE edema  Neurological: He is alert and oriented to person, place, and time.  Skin: Skin is warm and dry.   Lab Results: Basic Metabolic Panel:  Recent Labs Lab 09/14/15 0600  09/15/15 0640 09/15/15 1900  NA 143  < > 146* 144  K 2.5*  < > 3.1* 3.3*  CL 124*  < > 127* 126*  CO2 14*  < > 13* 13*  GLUCOSE 128*  < > 135* 129*  BUN 5*  < > 5* 5*  CREATININE 1.24  < > 1.11 1.06  CALCIUM 7.1*  < > 7.2* 7.0*  MG 1.6*  --  1.8  --   PHOS 1.4*  --  1.9*  --   < > = values in this interval not displayed. Liver Function Tests:  Recent Labs Lab 09/14/15 0600 09/15/15 0640  AST 37 28  ALT 22 20    ALKPHOS 72 77  BILITOT 0.9 0.9  PROT 4.5* 4.7*  ALBUMIN 2.1*  2.0* 2.1*   CBC:  Recent Labs Lab 09/12/15 1729 09/13/15 0347 09/14/15 0600  WBC 15.6* 14.4* 16.9*  NEUTROABS 12.5* 11.5*  --   HGB 8.6* 8.2* 8.9*  HCT 25.4* 24.6* 26.4*  MCV 84.1 85.1 84.9  PLT 207 217 228   Coagulation:  Recent Labs Lab 09/12/15 0256 09/13/15 0347 09/14/15 0600 09/15/15 0640  LABPROT 24.8* 25.5* 25.0* 20.2*  INR 2.26* 2.36* 2.29* 1.73*   Urine Drug Screen: Drugs of Abuse     Component Value Date/Time   LABOPIA * 06/23/2009 1831    POSITIVE (NOTE) Result repeated and verified. Sent for confirmatory testing   COCAINSCRNUR NEGATIVE 06/23/2009 1831   LABBENZ NEGATIVE 06/23/2009 1831   AMPHETMU NEGATIVE 06/23/2009 1831    Micro Results: Recent Results (from the past 240 hour(s))  MRSA PCR Screening     Status: None   Collection Time: 09/10/15  6:23 PM  Result Value Ref Range Status   MRSA by PCR NEGATIVE NEGATIVE Final    Comment:  The GeneXpert MRSA Assay (FDA approved for NASAL specimens only), is one component of a comprehensive MRSA colonization surveillance program. It is not intended to diagnose MRSA infection nor to guide or monitor treatment for MRSA infections.   Culture, blood (routine x 2)     Status: None   Collection Time: 09/10/15 11:25 PM  Result Value Ref Range Status   Specimen Description BLOOD LEFT ANTECUBITAL  Final   Special Requests BOTTLES DRAWN AEROBIC AND ANAEROBIC 10CC   Final   Culture NO GROWTH 5 DAYS  Final   Report Status 09/15/2015 FINAL  Final  Culture, blood (routine x 2)     Status: None   Collection Time: 09/10/15 11:30 PM  Result Value Ref Range Status   Specimen Description BLOOD RIGHT ARM  Final   Special Requests BOTTLES DRAWN AEROBIC AND ANAEROBIC 5CC  Final   Culture NO GROWTH 5 DAYS  Final   Report Status 09/16/2015 FINAL  Final   Studies/Results: No results found. Medications: I have reviewed the patient's current  medications. Scheduled Meds: . ampicillin-sulbactam (UNASYN) IV  3 g Intravenous Q8H  . antiseptic oral rinse  7 mL Mouth Rinse q12n4p  . chlorhexidine  15 mL Mouth Rinse BID  . insulin aspart  0-9 Units Subcutaneous 4 times per day  . pantoprazole (PROTONIX) IV  40 mg Intravenous Q24H   Continuous Infusions: . dextrose 5 % with KCl 20 mEq / L    .  sodium bicarbonate  infusion 1000 mL 100 mL/hr at 09/15/15 2148   PRN Meds:.HYDROmorphone (DILAUDID) injection Assessment/Plan: Active Problems:   Human immunodeficiency virus (HIV) disease (HCC)   Alcohol abuse   Status post above knee amputation ([place])   Long term current use of anticoagulant   Avascular necrosis of femoral head (HCC)   Septic shock (HCC)   Gram-negative bacteremia (HCC)   Abdominal pain   Pneumonia due to Klebsiella pneumoniae (HCC)   Pressure ulcer   Protein-calorie malnutrition, severe   SBO (small bowel obstruction) (HCC)   Hyperosmolality and/or hypernatremia   Hypomagnesemia  Septic shock 2/2 to Klebsiella pneumoniae Bacteremia: likely from GI (aspiratin) vs lung improving. Was on pressors at [facility]. Imaging shows bilateral pneumonia has improved since 11/8. Afebrile currently. Blood culture showing no growth in 5 days. Satting 96-100% on RA. BP stable this morning. He has been treated with antibiotics for 10 days now, initially started at the other hospital on admission there on November 7.  - D/c Unasyn  Partial SBO: Abdominal X-ray showing partial SBO, improved since 11/8. The patient takes scheduled MS contin at home 60 mg x 12h with oxycodone IR 5 mg for breakthrough, which may have caused his SBO. In addition, iron released from recent GI bleed could have also caused constipation. Gallstone ileus is also on the differential. He was given an enema yesterday and abdominal exam improved today - no abdominal tenderness or distention on exam today. However, bowel sounds could not be appreciated.  Patient had 2 bowel movements since yesterday. Patient pulled out his NGT last night.  - Meds on hold  because patient has pulled out his IV lines and refused treatment - including Hydromorphone 1 mg q4h prn, D5 fluid, TPN  - Will resume when patient agrees  - Clear liquids diet   Hematemesis: Unclear source of upper GI bleed. Hgb 8.9 on 11/15, with baseline over 13. Likely in the setting of elevated INR during previous hospitalization. INR 1.73 yesterday. He pulled out his NGT  overnight. - Meds on hold  because patient has pulled out his IV lines and refused treatment - including Protonix IV 40 mg - Will resume when patient agrees - Will do CBC when patient agrees  - Appreciate GI and surgery recs  GB dysfunction: cholestasis, EF 27% per HIDA.  - GI has been consulted, appreciate recs  High INR without warfarin - with History of Unprovoked DVT:  On anticoagulation indefinitely. Patient has consistently been supratherapeutic at anti-coag visits and presented to 10 at [facility].  - Holding warfarin but patient is auto-anticoagulation likely 2/2 to sepsis causing synthetic dysfunction of liver.  - Labs on hold including cbc and INR. Will be done when patient agrees.   Acute on Chronic Kidney Disease - likely 2/2 to ATN: Baseline creatinine of ~1.2. Was in 7's at outside hospital, now improving to 1.06 yesterday. Has good UOP (6.8 L since yesterday).  - continue to monitor.  -Fluids on hold because patient has pulled out his IV lines and refused treatment. Will resume when patient  Agrees.   Non gap Metabolic Acidosis: AG is resolved as no longer has lactic acidosis but persistently has low bicarb (13 yesterday).  - appreciate nephro recs. Continue IV bicarb when patent allows.   Hypokalemia and hypoMag - K 3.3 yesterday -IV KCl 10 meq q1 x6 + replete Mag as needed when patient allows   Hypophosphatemia - Phosphorous 1.9 yesterday. - check labs and replete when patient allows    Hypernatremia - likely 2/2 to free water deficit Received Normal Saline extensively before transferring to [place]. Na improved to 144 yesterday.  -Continue D5 to replete free water deficit and also to help with hypernatremia.  High urine output: patient continues to have high urine output - 6.8 L in the past 24 hours.  -Dose of DDAVP given yesterday for possible diabetes insipidus    HIV: CD4 decreased from 700 to 219 in a matter of 2 weeks, likely in the setting of acute illness. Viral load <20 in 07/2015.    - Restart anti-retroviral regimen after NPO status -F/u HLA B-57 (01)  Elevated AST: To 62. Likely downtrending from previous septic shock, but could represent underlying liver dysfunction driving up his INR. AST downtrended to 28 (normal) yesterday.    Stage II pressure ulcer -Foam dressing as recommended by wound care   Avascular Necrosis of Hip and Phantom Limb Pain: Pain management per above.   DVT Prophylaxis: SCDs Code Status: Full Admit to: Step down  Dispo: Disposition is deferred at this time, awaiting improvement of current medical problems.  Anticipated discharge in approximately 2-3 day(s).   The patient does have a current PCP Iline Oven, MD) and does need an [facility] hospital follow-up appointment after discharge.  The patient does not have transportation limitations that hinder transportation to clinic appointments.  .Services Needed at time of discharge: Y = Yes, Blank = No PT:   OT:   RN:   Equipment:   Other:     LOS: 6 days   Shela Leff, MD 09/16/2015, 3:11 PM

## 2015-09-16 NOTE — Progress Notes (Addendum)
Pt was lying in feces. RN went to clean pt with nurse tech. Pt refused to clean up and denies that there is no feces on his bed. Pt gown was dirty as well. RN changed the gown. Pt was verbally abusive  nurse and tech. Pt raised his hand and mentioned will knocked her down.   Will cont to care for pt.

## 2015-09-16 NOTE — Progress Notes (Signed)
Rounding in room' Dr. Patrecia Pour present. Pt refusing labs to be drawn and IV fluids to be restarted. When questioned adamantly stated "no".Daughter present at bedside and aware of pt refusing treatment. Will continue to follow.

## 2015-09-16 NOTE — Progress Notes (Signed)
Pt much more cooperative at this time. Sprite given at patient's request and blankets given.

## 2015-09-16 NOTE — Progress Notes (Signed)
[facility] ROUNDING NOTE   Subjective:   Interval History:  All of drips have been stopped-- NGT removed -- DDAVP given   Objective:  Vital signs in last 24 hours:  Temp:  [98.4 F (36.9 C)-99.6 F (37.6 C)] 98.7 F (37.1 C) (11/17 0737) Pulse Rate:  [26-117] 91 (11/17 0737) Resp:  [18-42] 30 (11/17 0737) BP: (93-147)/(57-105) 126/73 mmHg (11/17 0737) SpO2:  [79 %-100 %] 100 % (11/17 0737)  Weight change:  Filed Weights   09/13/15 0500 09/14/15 0500 09/15/15 0500  Weight: 46.222 kg (101 lb 14.4 oz) 48.898 kg (107 lb 12.8 oz) 45.4 kg (100 lb 1.4 oz)    Intake/Output: I/O last 3 completed shifts: In: 9344.5 [I.V.:6112.5; NG/GT:30; IV Piggyback:1652] Out: [id number]; Emesis/NG [id number]   Intake/Output this shift:    General: Very thin, cachectic HEENT: mucous membranes dry NGT --- suction  Neck: There is no JVD Heart: Regular rate and rhythm Lungs: Clear to auscultation bilaterally Abdomen: Decreased breath sounds, tender to palpation Extremities: No edema Neuro: Alert and nonfocal- some difficulty in understanding his speech   Basic Metabolic Panel:  Recent Labs Lab 09/12/15 0430  09/13/15 0348  09/14/15 0600 09/14/15 1105 09/14/15 2021 09/15/15 0640 09/15/15 1900  NA  --   < > 152*  < > 143 141 142 146* 144  K  --   < > 2.5*  < > 2.5* 2.3* 2.6* 3.1* 3.3*  CL  --   < > >130*  < > 124* 122* 124* 127* 126*  CO2  --   < > 15*  < > 14* 14* 13* 13* 13*  GLUCOSE  --   < > 196*  < > 128* 149* 127* 135* 129*  BUN  --   < > 11  < > 5* 6 <5* 5* 5*  CREATININE  --   < > 1.53*  < > 1.24 1.20 1.19 1.11 1.06  CALCIUM  --   < > 7.1*  < > 7.1* 6.9* 7.0* 7.2* 7.0*  MG 1.5*  --   --   --  1.6*  --   --  1.8  --   PHOS  --   --  1.8*  --  1.4*  --   --  1.9*  --   < > = values in this interval not displayed.  Liver Function Tests:  Recent Labs Lab 09/10/15 2022 09/11/15 0616 09/12/15 0256 09/13/15 0348 09/14/15 0600 09/15/15 0640   AST 62* 56* 49*  --  37 28  ALT 36 33 28  --  22 20  ALKPHOS 78 78 71  --  72 77  BILITOT 2.1* 1.3* 1.4*  --  0.9 0.9  PROT 4.9* 4.8* 4.7*  --  4.5* 4.7*  ALBUMIN 2.3* 2.2* 2.2* 2.0* 2.1*  2.0* 2.1*   No results for input(s): LIPASE, AMYLASE in the last 168 hours. No results for input(s): AMMONIA in the last 168 hours.  CBC:  Recent Labs Lab 09/11/15 0616 09/11/15 1525 09/12/15 0256 09/12/15 1729 09/13/15 0347 09/14/15 0600  WBC 16.6* 18.2* 15.9* 15.6* 14.4* 16.9*  NEUTROABS 13.7* 15.0* 13.0* 12.5* 11.5*  --   HGB 8.2* 8.5* 8.4* 8.6* 8.2* 8.9*  HCT 23.9* 24.5* 24.7* 25.4* 24.6* 26.4*  MCV 81.8 84.2 83.4 84.1 85.1 84.9  PLT 194 191 199 207 217 228    Cardiac Enzymes: No results for input(s): CKTOTAL, CKMB, CKMBINDEX, TROPONINI in the last 168 hours.  BNP: Invalid input(s): POCBNP  CBG:  Recent Labs Lab 09/14/15 1745 09/14/15 2334 09/15/15 0644 09/15/15 1239 09/15/15 1827  GLUCAP 112* 139* 116* 181* 101*    Microbiology: Results for orders placed or performed during the hospital encounter of 09/10/15  MRSA PCR Screening     Status: None   Collection Time: 09/10/15  6:23 PM  Result Value Ref Range Status   MRSA by PCR NEGATIVE NEGATIVE Final    Comment:        The GeneXpert MRSA Assay (FDA approved for NASAL specimens only), is one component of a comprehensive MRSA colonization surveillance program. It is not intended to diagnose MRSA infection nor to guide or monitor treatment for MRSA infections.   Culture, blood (routine x 2)     Status: None   Collection Time: 09/10/15 11:25 PM  Result Value Ref Range Status   Specimen Description BLOOD LEFT ANTECUBITAL  Final   Special Requests BOTTLES DRAWN AEROBIC AND ANAEROBIC 10CC   Final   Culture NO GROWTH 5 DAYS  Final   Report Status 09/15/2015 FINAL  Final  Culture, blood (routine x 2)     Status: None (Preliminary result)   Collection Time: 09/10/15 11:30 PM  Result Value Ref Range Status    Specimen Description BLOOD RIGHT ARM  Final   Special Requests BOTTLES DRAWN AEROBIC AND ANAEROBIC 5CC  Final   Culture NO GROWTH 4 DAYS  Final   Report Status PENDING  Incomplete    Coagulation Studies:  Recent Labs  09/14/15 0600 09/15/15 0640  LABPROT 25.0* 20.2*  INR 2.29* 1.73*    Urinalysis: No results for input(s): COLORURINE, LABSPEC, PHURINE, GLUCOSEU, HGBUR, BILIRUBINUR, KETONESUR, PROTEINUR, UROBILINOGEN, NITRITE, LEUKOCYTESUR in the last 72 hours.  Invalid input(s): APPERANCEUR    Imaging: No results found.   Medications:   . dextrose 5 % with KCl 20 mEq / L    .  sodium bicarbonate  infusion 1000 mL 100 mL/hr at 09/15/15 2148   . ampicillin-sulbactam (UNASYN) IV  3 g Intravenous Q8H  . antiseptic oral rinse  7 mL Mouth Rinse q12n4p  . chlorhexidine  15 mL Mouth Rinse BID  . insulin aspart  0-9 Units Subcutaneous 4 times per day  . pantoprazole (PROTONIX) IV  40 mg Intravenous Q24H   HYDROmorphone (DILAUDID) injection  Assessment/ Plan:  1.Renal- acute renal failure likely due to previous sepsis and likely volume depletion. Renal function is now normal and Is making good urine.  2. Metabolic acidosis - is non-gap.  This appears secondary to a Renal tubular acidosis   IV or oral bicarbonate can be given   ( most likely a proximal defect )  3. Hypernatremia and hyperchloremia- likely secondary to a lot of normal saline given at the other hospital. Improved  4. Hypokalemia- agree with repletion Will need  Again consistent with RTA  Distal vs proximal    ( appears clinically like a proximal injury ) 5. Anemia - significant in nature. Possibly some due to HIV and recent significant hospitalization as well as acute kidney injury. Supportive care for now 6. High urine output --  DDAVP for possible diabetes insipidus -- this may be acceptable considering normal renal function and high output     LOS: 6 [name] W @TODAY @10 :26 AM

## 2015-09-16 NOTE — Progress Notes (Signed)
Pt is refusing all the treatment since 1900. RN went into pt's room to introduce herself with dayshift RN and pt stated  " Just stop it there, whatever you have to say, i don't want to listen".  RN respect pt's right to refuse and gave a extra time to deescalate himself.RN went to reposition pt with nurse tech pt refused it. Pt refused to be touch by anyone. Pt refusing to wear pulse-ox, check temp, CBG, NG tube residuals.   RN made aware night time on call MD. Will cont to care and monitor pt.

## 2015-09-16 NOTE — Progress Notes (Signed)
Family in and helped with liquids from his tray. Tol without any nausea or vomiting.
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
## 2015-09-16 NOTE — Progress Notes (Signed)
Pharmacy: TPN Events of last night noted and d/w with Dr. Marlowe Sax.  Plan: DC TPN, start on clear liquid diet, hopefully his pSBO has resolved.  If not, Dr. Marlowe Sax will re- consult pharmacy for TPN as needed. Thanks Eudelia Bunch, Pharm.D. QP:[phone] 09/16/2015 10:17 AM

## 2015-09-16 NOTE — Progress Notes (Signed)
Pt allowed this nurse to draw his labs and restart IV fluids.
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
## 2015-09-16 NOTE — Progress Notes (Signed)
Pt pulled his NG tube out

## 2015-09-16 NOTE — Progress Notes (Signed)
  Subjective: uncooperative  Objective: Vital signs in last 24 hours: Temp:  [98.4 F (36.9 C)-99.6 F (37.6 C)] 98.7 F (37.1 C) (11/17 0737) Pulse Rate:  [26-117] 91 (11/17 0737) Resp:  [18-42] 30 (11/17 0737) BP: (93-147)/(57-105) 126/73 mmHg (11/17 0737) SpO2:  [79 %-100 %] 100 % (11/17 0737) Last BM Date: 09/15/15  Intake/Output from previous day: 11/16 0701 - 11/17 0700 In: 6394.5 [I.V.:4112.5; NG/GT:30; IV Piggyback:1302; TPN:950] Out: 7260 [Urine:6850; Emesis/NG output:410] Intake/Output this shift:    General appearance: uncooperative Resp: clear to auscultation bilaterally GI: soft, no guarding, mild tenderness but he is fighting any exam Neuro: agitated but F/C  Lab Results:   Recent Labs  09/14/15 0600  WBC 16.9*  HGB 8.9*  HCT 26.4*  PLT 228   BMET  Recent Labs  09/15/15 0640 09/15/15 1900  NA 146* 144  K 3.1* 3.3*  CL 127* 126*  CO2 13* 13*  GLUCOSE 135* 129*  BUN 5* 5*  CREATININE 1.11 1.06  CALCIUM 7.2* 7.0*   PT/INR  Recent Labs  09/14/15 0600 09/15/15 0640  LABPROT 25.0* 20.2*  INR 2.29* 1.73*   ABG No results for input(s): PHART, HCO3 in the last 72 hours.  Invalid input(s): PCO2, PO2  Studies/Results: Dg Abd Portable 1v  09/14/2015  CLINICAL DATA:  Small bowel obstruction. EXAM: PORTABLE ABDOMEN - 1 VIEW COMPARISON:  09/10/2015 FINDINGS: There are dilated bowel loops in the mid abdomen, consistent with the given history of small-bowel obstruction. There is a moderate amount of formed stool throughout the colon. Enteric catheter is again seen, with tip at the expected location of the pylorus. Rectal probe is also noted. Mixed lytic and sclerotic appearance of the left femoral head is again seen. Query avascular necrosis. IMPRESSION: Continued small bowel obstruction, with no significant improvement from the prior radiograph. Constipation. Stable positioning of the enteric catheter with tip at the expected location of the  pylorus. Electronically Signed   By: Fidela Salisbury M.D.   On: 09/14/2015 09:09    Anti-infectives: Anti-infectives    Start     Dose/Rate Route Frequency Ordered Stop   09/11/15 0000  Ampicillin-Sulbactam (UNASYN) 3 g in sodium chloride 0.9 % 100 mL IVPB     3 g 100 mL/hr over 60 Minutes Intravenous Every 8 hours 09/10/15 2347        Assessment/Plan: SBO - 2BM yesterday, NGT is now out. Try clears  LOS: 6 days    Laurita Peron E 09/16/2015

## 2015-09-16 NOTE — Progress Notes (Addendum)
Pt was yelling for a help. Nurse went to check on him and asked how can I help you sir? Pt insist to take his gown off. Pt started ripping his gown off, ripped central line tubing and was trying to pull his foley catheter. Internal medicine teaching service was paged. Security at bedside. Nurse was  concerned about pt safety as he was sitting on the edge of the bed and trying to get out and not let anyone help him or touch him. Nurse explained her concerned to pt.   Resident arrived on the unit. Resident  Concern was  that all the poking and sttabing  made pt more agitated. Nurse reassure to resident and intern that when pt was denying treatments and nursing care. Nurse respect pt's decision and did not force him to do any treatments or nursing care. Pt was not poked to check CBG on entire shift.
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
## 2015-09-17 DIAGNOSIS — F1010 Alcohol abuse, uncomplicated: Secondary | ICD-10-CM

## 2015-09-17 DIAGNOSIS — Z89611 Acquired absence of right leg above knee: Secondary | ICD-10-CM

## 2015-09-17 DIAGNOSIS — D500 Iron deficiency anemia secondary to blood loss (chronic): Secondary | ICD-10-CM

## 2015-09-17 DIAGNOSIS — L8990 Pressure ulcer of unspecified site, unspecified stage: Secondary | ICD-10-CM

## 2015-09-17 DIAGNOSIS — B20 Human immunodeficiency virus [HIV] disease: Secondary | ICD-10-CM

## 2015-09-17 DIAGNOSIS — Z7901 Long term (current) use of anticoagulants: Secondary | ICD-10-CM

## 2015-09-17 LAB — COMPREHENSIVE METABOLIC PANEL
ALK PHOS: 82 U/L (ref 38–126)
ALT: 23 U/L (ref 17–63)
AST: 34 U/L (ref 15–41)
Albumin: 2.2 g/dL — ABNORMAL LOW (ref 3.5–5.0)
Anion gap: 6 (ref 5–15)
BUN: 6 mg/dL (ref 6–20)
CALCIUM: 7.3 mg/dL — AB (ref 8.9–10.3)
CO2: 14 mmol/L — ABNORMAL LOW (ref 22–32)
CREATININE: 1.21 mg/dL (ref 0.61–1.24)
Chloride: 122 mmol/L — ABNORMAL HIGH (ref 101–111)
GFR calc Af Amer: >60 mL/min (ref >60)
Glucose, Bld: 95 mg/dL (ref 65–99)
POTASSIUM: 2.6 mmol/L — AB (ref 3.5–5.1)
Sodium: 142 mmol/L (ref 135–145)
TOTAL PROTEIN: 5 g/dL — AB (ref 6.5–8.1)
Total Bilirubin: 0.5 mg/dL (ref 0.3–1.2)

## 2015-09-17 LAB — GLUCOSE, CAPILLARY
GLUCOSE-CAPILLARY: 94 mg/dL (ref 65–99)
GLUCOSE-CAPILLARY: 91 mg/dL (ref 65–99)
Glucose-Capillary: 87 mg/dL (ref 65–99)
Glucose-Capillary: 95 mg/dL (ref 65–99)

## 2015-09-17 LAB — HLA B*5701: HLA B 5701: NEGATIVE

## 2015-09-17 LAB — PROTIME-INR
INR: 1.34 (ref 0.00–1.49)
PROTHROMBIN TIME: 16.7 s — AB (ref 11.6–15.2)

## 2015-09-17 LAB — MAGNESIUM: MAGNESIUM: 1.4 mg/dL — AB (ref 1.7–2.4)

## 2015-09-17 LAB — PHOSPHORUS: Phosphorus: 1.6 mg/dL — ABNORMAL LOW (ref 2.5–4.6)

## 2015-09-17 MED ORDER — POTASSIUM PHOSPHATES 15 MMOLE/5ML IV SOLN
20.0000 mmol | Freq: Once | INTRAVENOUS | Status: AC
  Administered 2015-09-17: 20 mmol via INTRAVENOUS
  Filled 2015-09-17: qty 6.67

## 2015-09-17 MED ORDER — ENOXAPARIN SODIUM 40 MG/0.4ML ~~LOC~~ SOLN
40.0000 mg | SUBCUTANEOUS | Status: DC
  Administered 2015-09-17: 40 mg via SUBCUTANEOUS
  Filled 2015-09-17 (×4): qty 0.4

## 2015-09-17 MED ORDER — DEXTROSE 5 % IV SOLN
INTRAVENOUS | Status: DC
  Filled 2015-09-17: qty 1000

## 2015-09-17 MED ORDER — DEXTROSE 5 % IV SOLN: 40.0000 meq | Freq: Once | INTRAVENOUS | Status: DC

## 2015-09-17 MED ORDER — POTASSIUM CHLORIDE 10 MEQ/50ML IV SOLN
10.0000 meq | INTRAVENOUS | Status: AC
  Administered 2015-09-17 (×6): 10 meq via INTRAVENOUS
  Filled 2015-09-17 (×6): qty 50

## 2015-09-17 MED ORDER — ACETAMINOPHEN 500 MG PO TABS
500.0000 mg | ORAL_TABLET | ORAL | Status: DC | PRN
Start: 2015-09-17 — End: 2015-09-22
  Administered 2015-09-17 – 2015-09-21 (×4): 500 mg via ORAL
  Filled 2015-09-17 (×4): qty 1

## 2015-09-17 MED ORDER — DEXTROSE 5 % IV SOLN
INTRAVENOUS | Status: DC
  Administered 2015-09-17 – 2015-09-18 (×3): via INTRAVENOUS
  Filled 2015-09-17: qty 1000

## 2015-09-17 MED ORDER — MAGNESIUM SULFATE 2 GM/50ML IV SOLN
2.0000 g | Freq: Once | INTRAVENOUS | Status: AC
  Administered 2015-09-17: 2 g via INTRAVENOUS
  Filled 2015-09-17: qty 50

## 2015-09-17 MED ORDER — LORAZEPAM 2 MG/ML IJ SOLN
1.0000 mg | Freq: Once | INTRAMUSCULAR | Status: AC
  Administered 2015-09-17: 1 mg via INTRAVENOUS
  Filled 2015-09-17: qty 1

## 2015-09-17 NOTE — Progress Notes (Signed)
Patient ID: [name], male   DOB: 1961-08-15, 54 y.o.   MRN: [id number] Medicine attending: I examined this patient this morning together with resident physician Dr. Shela Leff and we discussed the management plan. He is making slow but steady progress. He is much more interactive today. Out of bed in a chair. Communicating with [place]. Starting to take by mouth liquids. No bowel movement since enema the other day. Abdomen remains soft, nontender, decompressed. Bowel sounds still hypoactive to absent. Right leg amputation noted. He remains hypokalemic, hypomagnesemic, and hypophosphatemic despite ongoing daily replacement. He has completed a planned course of antibiotics. Hemoglobin has drifted down to 7.7. Post-ATN diuresis is subsiding. I/O2.1/3.65 Sodium now normal. Impression: #1. Klebsiella pneumonia with associated bacteremia. 10 day course of antibiotics completed. #2. Small bowel obstruction now resolving. We will advance diet as tolerated. #3. Acute kidney injury related to #1 now resolved #4. Hypernatremia resolved #5. Persistent hypokalemia replacement in progress #6. Persistent hypomagnesemia and hypophosphatemia-replacement in progress #7. Post-ATN diuresis subsiding #8. Non-anion gap metabolic acidosis. Gap has closed but bicarbonate still low despite ongoing parenteral bicarbonate replacement. I would like to see [place] stop the bicarbonate and allow him to equilibrate. Nephrologist suspects that this is renal tubular acidosis. If he continues to do well with respect to his by mouth intake, we can stop the IV bicarbonate and change to oral bicarbonate. #9. Severe protein calorie malnutrition-TPN just started the other day #10. Deconditioning from prolonged hospitalization #11. HIV-AIDS #12. 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         He is anemic from acute illness but no obvious active bleeding. We're  going to resume thromboprophylaxis with        low-dose Lovenox at this time. Resume warfarin anticoagulation only when he is taking a solid diet.

## 2015-09-17 NOTE — Care Management Important Message (Signed)
Important Message  Patient Details  Name: [name] MRN: [id number] Date of Birth: Mar 29, 1961   Medicare Important Message Given:  Yes    Nathen May 09/17/2015, 11:34 AM

## 2015-09-17 NOTE — Progress Notes (Signed)
CRITICAL VALUE ALERT  Critical value received:   Potassium 2.6  Date of notification:  09/17/2015   Time of notification:  6:11 AM  Critical value read back: yes  Nurse who received alert:  Reatha Armour RN  MD notified (1st page):    Time of first page:  [phone]  MD notified (2nd page):  Time of second page:  Responding MD:  Melburn Hake  Time MD responded:  [phone]

## 2015-09-17 NOTE — Progress Notes (Signed)
Pt fell asleep after ativan was given, holding off for soft wrist restraints at this time per MD. Will monitor.

## 2015-09-17 NOTE — Progress Notes (Signed)
Patient transferred from [facility] to [place] [facility]. Alert and oriented. Brought down in recliner. Right AKA. Triple lumen to right subclavian. Sodium Bicarb at 100cc/hr through one lumen. KCL through another lumen and Potassium Phosphate started through final lumen at 84cc/hr. Tolerating well. Foam to sacrum. Breath sounds diminished but clear. Heart beat regular. Good bowel sounds. Weak left dorsalis pedis pulse.  Melford Aase, RN

## 2015-09-17 NOTE — Progress Notes (Signed)
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   Admitted in transfer from [facility] where he was admitted on 11-7 dark stools and hematemesis. He was found to be acidotic, hyponatremic, and required intubation. He was found to have bilateral pneumonia (possible aspiration), SBO as well as INR > 10.      PT Comments    Pt more participative with encouragement.  Emphasis on standing tolerance, balance and gait. With RW.  Prosthesis not available today.  Follow Up Recommendations  CIR     Equipment Recommendations  None recommended by PT    Recommendations for Other Services       Precautions / Restrictions Precautions Precautions: Fall    Mobility  Bed Mobility               General bed mobility comments: already in the chair  Transfers Overall transfer level: Needs assistance Equipment used: Rolling walker (2 wheeled) Transfers: Sit to/from Stand Sit to Stand: Min assist         General transfer comment: cues for hand placement.  3 standing trials.  Ambulation/Gait Ambulation/Gait assistance: Min assist;Mod assist Ambulation Distance (Feet): 4 Feet (forward and back x2  (more difficulty backing up), feet forw) Assistive device: Rolling walker (2 wheeled) Gait Pattern/deviations: Step-to pattern     General Gait Details: slow and steady swing forward.   Stairs            Wheelchair Mobility    Modified Rankin (Stroke Patients Only)       Balance Overall balance assessment: Needs assistance Sitting-balance support: No upper extremity supported Sitting balance-Leahy Scale: Fair     Standing balance support: Bilateral upper extremity supported Standing balance-Leahy Scale: Poor Standing balance comment: Reliant on the RW, but steady on 1 leg during  gait                    Cognition Arousal/Alertness: Lethargic;Awake/alert Behavior During Therapy: WFL for tasks assessed/performed Overall Cognitive Status: Within Functional Limits for tasks assessed                      Exercises      General Comments General comments (skin integrity, edema, etc.): Called his daughter to get his prosthesis, socks and shoes here for therapy.      Pertinent Vitals/Pain Pain Assessment: Faces Faces Pain Scale: No hurt    Home Living                      Prior Function            PT Goals (current goals can now be found in the care plan section) Acute Rehab PT Goals Patient Stated Goal: To get stronger PT Goal Formulation: With patient Time For Goal Achievement: 09/27/15 Potential to Achieve Goals: Good Progress towards PT goals: Progressing toward goals    Frequency  Min 2X/week    PT Plan Current plan remains appropriate    Co-evaluation             End of Session   Activity Tolerance: Patient limited by fatigue;Patient tolerated treatment well Patient left: in chair;with call bell/phone within reach     Time: 1121-1153 PT Time Calculation (min) (ACUTE ONLY): 32 min  Charges:  $Therapeutic Activity: 23-37  mins                    G Codes:      Khaliah Barnick, Tessie Fass 09/17/2015, 1:39 PM 09/17/2015  Donnella Sham, PT [phone]  (pager)

## 2015-09-17 NOTE — Progress Notes (Signed)
[facility] ROUNDING NOTE   Subjective:   Interval History:  No complaints sitting in chair and comfortable with NGT removed   Objective:  Vital signs in last 24 hours:  Temp:  [98 F (36.7 C)-99.3 F (37.4 C)] 98 F (36.7 C) (11/18 0847) Pulse Rate:  [97-105] 105 (11/17 1949) Resp:  [18-26] 18 (11/18 0847) BP: (94-117)/(56-70) 94/56 mmHg (11/18 0847) SpO2:  [97 %-99 %] 99 % (11/18 0033)  Weight change:  Filed Weights   09/13/15 0500 09/14/15 0500 09/15/15 0500  Weight: 46.222 kg (101 lb 14.4 oz) 48.898 kg (107 lb 12.8 oz) 45.4 kg (100 lb 1.4 oz)    Intake/Output: I/O last 3 completed shifts: In: 4700 [P.O.:500; I.V.:3050; IV Piggyback:800] Out: 7450 [Urine:7450]   Intake/Output this shift:  Total I/O In: 50 [IV Piggyback:50] Out: 800 [Urine:800]  General: Very thin, cachectic HEENT: mucous membranes dry NGT --- suction  Neck: There is no JVD Heart: Regular rate and rhythm Lungs: Clear to auscultation bilaterally Abdomen: Decreased breath sounds, tender to palpation Extremities: No edema Neuro: Alert and nonfocal- some difficulty in understanding his speech   Basic Metabolic Panel:  Recent Labs Lab 09/12/15 0430  09/13/15 0348  09/14/15 0600  09/14/15 2021 09/15/15 0640 09/15/15 1900 09/16/15 1523 09/17/15 0535  NA  --   < > 152*  < > 143  < > 142 146* 144 144 142  K  --   < > 2.5*  < > 2.5*  < > 2.6* 3.1* 3.3* 3.0* 2.6*  CL  --   < > >130*  < > 124*  < > 124* 127* 126* 127* 122*  CO2  --   < > 15*  < > 14*  < > 13* 13* 13* 11* 14*  GLUCOSE  --   < > 196*  < > 128*  < > 127* 135* 129* 97 95  BUN  --   < > 11  < > 5*  < > <5* 5* 5* 6 6  CREATININE  --   < > 1.53*  < > 1.24  < > 1.19 1.11 1.06 1.11 1.21  CALCIUM  --   < > 7.1*  < > 7.1*  < > 7.0* 7.2* 7.0* 7.4* 7.3*  MG 1.5*  --   --   --  1.6*  --   --  1.8  --  1.4* 1.4*  PHOS  --   --  1.8*  --  1.4*  --   --  1.9*  --  2.1* 1.6*  < > = values in this interval not  displayed.  Liver Function Tests:  Recent Labs Lab 09/12/15 0256 09/13/15 0348 09/14/15 0600 09/15/15 0640 09/16/15 1523 09/17/15 0535  AST 49*  --  37 28 40 34  ALT 28  --  22 20 24 23   ALKPHOS 71  --  72 77 86 82  BILITOT 1.4*  --  0.9 0.9 0.8 0.5  PROT 4.7*  --  4.5* 4.7* 5.2* 5.0*  ALBUMIN 2.2* 2.0* 2.1*  2.0* 2.1* 2.4* 2.2*   No results for input(s): LIPASE, AMYLASE in the last 168 hours. No results for input(s): AMMONIA in the last 168 hours.  CBC:  Recent Labs Lab 09/11/15 0616 09/11/15 1525 09/12/15 0256 09/12/15 1729 09/13/15 0347 09/14/15 0600 09/16/15 1523  WBC 16.6* 18.2* 15.9* 15.6* 14.4* 16.9* 16.3*  NEUTROABS 13.7* 15.0* 13.0* 12.5* 11.5*  --   --   HGB 8.2* 8.5* 8.4* 8.6*  8.2* 8.9* 7.7*  HCT 23.9* 24.5* 24.7* 25.4* 24.6* 26.4* 23.3*  MCV 81.8 84.2 83.4 84.1 85.1 84.9 86.0  PLT 194 191 199 207 217 228 255    Cardiac Enzymes: No results for input(s): CKTOTAL, CKMB, CKMBINDEX, TROPONINI in the last 168 hours.  BNP: Invalid input(s): POCBNP  CBG:  Recent Labs Lab 09/15/15 1239 09/15/15 1827 09/16/15 1639 09/17/15 0032 09/17/15 0535  GLUCAP 181* 101* 148* 107 95    Microbiology: Results for orders placed or performed during the hospital encounter of 09/10/15  MRSA PCR Screening     Status: None   Collection Time: 09/10/15  6:23 PM  Result Value Ref Range Status   MRSA by PCR NEGATIVE NEGATIVE Final    Comment:        The GeneXpert MRSA Assay (FDA approved for NASAL specimens only), is one component of a comprehensive MRSA colonization surveillance program. It is not intended to diagnose MRSA infection nor to guide or monitor treatment for MRSA infections.   Culture, blood (routine x 2)     Status: None   Collection Time: 09/10/15 11:25 PM  Result Value Ref Range Status   Specimen Description BLOOD LEFT ANTECUBITAL  Final   Special Requests BOTTLES DRAWN AEROBIC AND ANAEROBIC 10CC   Final   Culture NO GROWTH 5 DAYS  Final    Report Status 09/15/2015 FINAL  Final  Culture, blood (routine x 2)     Status: None   Collection Time: 09/10/15 11:30 PM  Result Value Ref Range Status   Specimen Description BLOOD RIGHT ARM  Final   Special Requests BOTTLES DRAWN AEROBIC AND ANAEROBIC 5CC  Final   Culture NO GROWTH 5 DAYS  Final   Report Status 09/16/2015 FINAL  Final    Coagulation Studies:  Recent Labs  09/15/15 0640 09/16/15 1523 09/17/15 0535  LABPROT 20.2* 16.6* 16.7*  INR 1.73* 1.33 1.34    Urinalysis: No results for input(s): COLORURINE, LABSPEC, PHURINE, GLUCOSEU, HGBUR, BILIRUBINUR, KETONESUR, PROTEINUR, UROBILINOGEN, NITRITE, LEUKOCYTESUR in the last 72 hours.  Invalid input(s): APPERANCEUR    Imaging: No results found.   Medications:   .  sodium bicarbonate  infusion 1000 mL 100 mL/hr at 09/16/15 2019   . antiseptic oral rinse  7 mL Mouth Rinse q12n4p  . chlorhexidine  15 mL Mouth Rinse BID  . enoxaparin (LOVENOX) injection  40 mg Subcutaneous Q24H  . insulin aspart  0-9 Units Subcutaneous 4 times per day  . pantoprazole (PROTONIX) IV  40 mg Intravenous Q24H  . potassium chloride  10 mEq Intravenous Q1 Hr x 6  . potassium phosphate IVPB (mmol)  20 mmol Intravenous Once   acetaminophen  Assessment/ Plan:  1.Renal- acute renal failure likely due to previous sepsis and likely volume depletion. Renal function is now normal and Is making good urine.  2. Metabolic acidosis - is non-gap. This appears secondary to a Renal tubular acidosis IV or oral bicarbonate can be given ( most likely a proximal defect )  3. Hypernatremia and hyperchloremia- likely secondary to a lot of normal saline given at the other hospital. Improved  4. Hypokalemia- agree with repletion Will need Again consistent with RTA Distal vs proximal ( appears clinically like a proximal injury ) 5. Anemia - significant in nature. Possibly some due to HIV and recent significant hospitalization as well as acute  kidney injury. Supportive care for now 6. High urine output -- DDAVP given for possible diabetes insipidus -- this may be acceptable considering normal  renal function and high output    LOS: 7 Gennavieve Huq W @TODAY @11 :31 AM

## 2015-09-17 NOTE — Progress Notes (Signed)
Patient transferred to 3E22 via chair with RN and tech. Patient declined having RN call patient's daughter to notify of move. Roselyn Reef Zyere Jiminez,RN

## 2015-09-17 NOTE — Progress Notes (Signed)
Subjective: Patient is more cooperative today. He is allowing nursing staff to draw blood for labs and administer medications. Patient was seen an examined this morning. He was OOB sitting in bedside chair watching TV. He is denying having any complaints at present. Denies having any CP, SOB, nausea, vomiting, or abdominal pain. Patient is tolerating PO intake (liquid diet) well.   Objective: Vital signs in last 24 hours: Filed Vitals:   09/17/15 1100 09/17/15 1200 09/17/15 1204 09/17/15 1309  BP:  100/68 100/68 111/64  Pulse:   106 98  Temp:   98 F (36.7 C) 98.7 F (37.1 C)  TempSrc:   Oral Oral  Resp: '20 23 17 18  ' Height:    6' (1.829 m)  Weight:    92 lb 13 oz (42.1 kg)  SpO2:    100%   Weight change:   Intake/Output Summary (Last 24 hours) at 09/17/15 1447 Last data filed at 09/17/15 1205  Gross per 24 hour  Intake   2350 ml  Output   4325 ml  Net  -1975 ml   Physical Exam  Constitutional: He is oriented to person, place, and time. No distress.  Cachectic   Cardiovascular: Normal rate, regular rhythm and intact distal pulses.  Exam reveals no gallop and no friction rub.   No murmur heard. Pulmonary/Chest: Effort normal. No respiratory distress. He has no wheezes. He has no rales.  Abdominal: He exhibits no distension. There is no tenderness. There is no rebound and no guarding.  No bowel sounds appreciated.   Musculoskeletal:  R AKA stump well healed No LLE edema  Neurological: He is alert and oriented to person, place, and time.  Skin: Skin is warm and dry.   Lab Results: Basic Metabolic Panel:  Recent Labs Lab 09/16/15 1523 09/17/15 0535  NA 144 142  K 3.0* 2.6*  CL 127* 122*  CO2 11* 14*  GLUCOSE 97 95  BUN 6 6  CREATININE 1.11 1.21  CALCIUM 7.4* 7.3*  MG 1.4* 1.4*  PHOS 2.1* 1.6*   Liver Function Tests:  Recent Labs Lab 09/16/15 1523 09/17/15 0535  AST 40 34  ALT 24 23  ALKPHOS 86 82  BILITOT 0.8 0.5  PROT 5.2* 5.0*  ALBUMIN 2.4* 2.2*    CBC:  Recent Labs Lab 09/12/15 1729 09/13/15 0347 09/14/15 0600 09/16/15 1523  WBC 15.6* 14.4* 16.9* 16.3*  NEUTROABS 12.5* 11.5*  --   --   HGB 8.6* 8.2* 8.9* 7.7*  HCT 25.4* 24.6* 26.4* 23.3*  MCV 84.1 85.1 84.9 86.0  PLT 207 217 228 255   Coagulation:  Recent Labs Lab 09/14/15 0600 09/15/15 0640 09/16/15 1523 09/17/15 0535  LABPROT 25.0* 20.2* 16.6* 16.7*  INR 2.29* 1.73* 1.33 1.34   Urine Drug Screen: Drugs of Abuse     Component Value Date/Time   LABOPIA * 06/23/2009 1831    POSITIVE (NOTE) Result repeated and verified. Sent for confirmatory testing   COCAINSCRNUR NEGATIVE 06/23/2009 1831   LABBENZ NEGATIVE 06/23/2009 1831   AMPHETMU NEGATIVE 06/23/2009 1831    Micro Results: Recent Results (from the past 240 hour(s))  MRSA PCR Screening     Status: None   Collection Time: 09/10/15  6:23 PM  Result Value Ref Range Status   MRSA by PCR NEGATIVE NEGATIVE Final    Comment:        The GeneXpert MRSA Assay (FDA approved for NASAL specimens only), is one component of a comprehensive MRSA colonization surveillance program. It is  not intended to diagnose MRSA infection nor to guide or monitor treatment for MRSA infections.   Culture, blood (routine x 2)     Status: None   Collection Time: 09/10/15 11:25 PM  Result Value Ref Range Status   Specimen Description BLOOD LEFT ANTECUBITAL  Final   Special Requests BOTTLES DRAWN AEROBIC AND ANAEROBIC 10CC   Final   Culture NO GROWTH 5 DAYS  Final   Report Status 09/15/2015 FINAL  Final  Culture, blood (routine x 2)     Status: None   Collection Time: 09/10/15 11:30 PM  Result Value Ref Range Status   Specimen Description BLOOD RIGHT ARM  Final   Special Requests BOTTLES DRAWN AEROBIC AND ANAEROBIC 5CC  Final   Culture NO GROWTH 5 DAYS  Final   Report Status 09/16/2015 FINAL  Final   Studies/Results: No results found. Medications: I have reviewed the patient's current medications. Scheduled Meds: .  antiseptic oral rinse  7 mL Mouth Rinse q12n4p  . chlorhexidine  15 mL Mouth Rinse BID  . enoxaparin (LOVENOX) injection  40 mg Subcutaneous Q24H  . insulin aspart  0-9 Units Subcutaneous 4 times per day  . pantoprazole (PROTONIX) IV  40 mg Intravenous Q24H  . potassium phosphate IVPB (mmol)  20 mmol Intravenous Once   Continuous Infusions: . dextrose 5 % 1,000 mL infusion    .  sodium bicarbonate  infusion 1000 mL 100 mL/hr at 09/17/15 1200   PRN Meds:.acetaminophen Assessment/Plan: Active Problems:   Human immunodeficiency virus (HIV) disease (HCC)   Alcohol abuse   Status post above knee amputation ([place])   Long term current use of anticoagulant   Avascular necrosis of femoral head (HCC)   Septic shock (HCC)   Gram-negative bacteremia (HCC)   Abdominal pain   Pneumonia due to Klebsiella pneumoniae (HCC)   Pressure ulcer   Protein-calorie malnutrition, severe   SBO (small bowel obstruction) (HCC)   Hyperosmolality and/or hypernatremia   Hypomagnesemia  Septic shock 2/2 to Klebsiella pneumoniae Bacteremia: likely from GI (aspiratin) vs lung (resolved).  Was on pressors at [facility]. Imaging shows bilateral pneumonia has improved since 11/8. Afebrile currently. Blood culture showing no growth in 5 days. Satting 97-100% on RA. BP stable this morning. He has been treated with antibiotics for 10 days now, initially started at the other hospital on admission there on November 7. Unasyn was discontinued yesterday (11/17).   Partial SBO: Abdominal X-ray showing partial SBO, improved since 11/8.  He was given an enema and abdominal exam has improved since yesterday - no abdominal tenderness or distention on exam. However, bowel sounds could not be appreciated. Patient pulled out his NGT over a day ago. He is tolerating PO intake (liquid diet) well.  - Transfer to tele - Hydromorphone 1 mg q4h prn - D5 fluid - Clear liquids diet  - Appreciate surgery recs  - SLP consult,  appreciate recs   Hematemesis: Unclear source of upper GI bleed. Hgb 8.9 on 11/15, with baseline over 13. Likely in the setting of elevated INR during previous hospitalization. INR 1.34 today. - Protonix IV 40 mg - CBC in am  GB dysfunction: cholestasis, EF 27% per HIDA.  - GI has been consulted, appreciate recs  High INR without warfarin - with History of Unprovoked DVT:  Patient had consistently been supratherapeutic at anti-coag visits and presented to 10 at [facility]. We held Coumadin for this reason. High INR resolved now (1.34).   - Lovenox per pharmacy  -  F/u daily cbc and INR  Acute on Chronic Kidney Disease - likely 2/2 to ATN: Baseline creatinine of ~1.2. Was in 7's at outside hospital, 1.02 today. Has good UOP (3.6 L since yesterday).  - continue to monitor.  - F/u daily BMP  Non gap Metabolic Acidosis: AG is resolved as no longer has lactic acidosis but persistently has low bicarb (13 yesterday).  - appreciate nephro recs. Continue IV bicarb.  Hypokalemia and hypoMag - K 2.6 and mag 1.4 this morning -IV KCl 10 meq q1 x6 + replete Mag as needed  Hypophosphatemia - Phosphorous 1.6 today - check labs and replete   Hypernatremia - likely 2/2 to free water deficit Received Normal Saline extensively before transferring to [place]. Na improved to 142 today -Continue D5  High urine output: Dose of DDAVP given on 11/16 for possible diabetes insipidus. Urine output decreased to 3.6 L in 24 hrs now.   HIV: CD4 decreased from 700 to 219 in a matter of 2 weeks, likely in the setting of acute illness. Viral load <20 in 07/2015.    - Restart anti-retroviral regimen after NPO status -F/u HLA B-57 (01)  Elevated AST: To 62. Likely downtrending from previous septic shock, but could represent underlying liver dysfunction driving up his INR. AST normal now.     Stage II pressure ulcer -Foam dressing as recommended by wound care   Avascular Necrosis of Hip and Phantom Limb Pain: Pain  management per above.   DVT Prophylaxis: SCDs Code Status: Full Admit to: Step down  Dispo: Disposition is deferred at this time, awaiting improvement of current medical problems.  Anticipated discharge in approximately 2-3 day(s).   The patient does have a current PCP Iline Oven, MD) and does need an [facility] hospital follow-up appointment after discharge.  The patient does not have transportation limitations that hinder transportation to clinic appointments.  .Services Needed at time of discharge: Y = Yes, Blank = No PT:   OT:   RN:   Equipment:   Other:     LOS: 7 days   Shela Leff, MD 09/17/2015, 2:47 PM

## 2015-09-17 NOTE — Progress Notes (Signed)
  Subjective: Sitting up in chair, tolerated clears, denies nausea or abdominal pain  Objective: Vital signs in last 24 hours: Temp:  [98.3 F (36.8 C)-99.3 F (37.4 C)] 98.3 F (36.8 C) (11/18 0400) Pulse Rate:  [97-105] 105 (11/17 1949) Resp:  [22-26] 26 (11/18 0400) BP: (94-117)/(59-70) 104/66 mmHg (11/18 0400) SpO2:  [96 %-99 %] 99 % (11/18 0033) Last BM Date: 09/15/15  Intake/Output from previous day: 11/17 0701 - 11/18 0700 In: 2100 [P.O.:500; I.V.:1300; IV Piggyback:300] Out: 3650 [Urine:3650] Intake/Output this shift: Total I/O In: 50 [IV Piggyback:50] Out: -   General appearance: cooperative Resp: clear to auscultation bilaterally GI: soft, mild distention, nontender, +BS  Lab Results:   Recent Labs  09/16/15 1523  WBC 16.3*  HGB 7.7*  HCT 23.3*  PLT 255   BMET  Recent Labs  09/16/15 1523 09/17/15 0535  NA 144 142  K 3.0* 2.6*  CL 127* 122*  CO2 11* 14*  GLUCOSE 97 95  BUN 6 6  CREATININE 1.11 1.21  CALCIUM 7.4* 7.3*   PT/INR  Recent Labs  09/16/15 1523 09/17/15 0535  LABPROT 16.6* 16.7*  INR 1.33 1.34   ABG No results for input(s): PHART, HCO3 in the last 72 hours.  Invalid input(s): PCO2, PO2  Studies/Results: No results found.  Anti-infectives: Anti-infectives    Start     Dose/Rate Route Frequency Ordered Stop   09/11/15 0000  Ampicillin-Sulbactam (UNASYN) 3 g in sodium chloride 0.9 % 100 mL IVPB  Status:  Discontinued     3 g 100 mL/hr over 60 Minutes Intravenous Every 8 hours 09/10/15 2347 09/16/15 1555      Assessment/Plan: SBO - improving, advance to fulls  LOS: 7 days    Yaneth Fairbairn E 09/17/2015

## 2015-09-17 NOTE — Consult Note (Addendum)
Physical Medicine and Rehabilitation Consult Reason for Consult: Debility Referring Physician: Dr. Johnnye Sima   HPI: [name] is a 54 y.o. 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 Patient lives with his 69 year old daughter independent with assistive device prior to admission and prosthesis. Pt's daughter is planning on going to work and is in the process of trying to obtain some assistance for her father.  One level apt with ramped entrance. Presented in transfer from [facility] 09/10/2015 with dark tarry stools and hematemesis. INR greater than 10 and reversed. Found to be acidotic, hypernatremic/hypokalemic required intubation. Chest x-ray bilateral pneumonia question aspiration. Placed on broad-spectrum antibiotics. He was transfused. Blood cultures positive for Klebsiella. KUB of the abdomen showed partial small bowel obstruction requiring nasogastric tube. Nephrology services follow-up for hypernatremia as well as hypokalemia with supplement added and latest sodium improved. Hemoglobin remained stable 7.7-8.4. Subcutaneous Lovenox for DVT prophylaxis. Remains on a full liquid diet. Wound care nurse follow-up for sacral wound with dressing changes as advised. Physical therapy evaluation completed and ongoing noting debilitation with recommendations of physical medicine rehabilitation consult.   Review of Systems  Constitutional: Positive for weight loss. Negative for fever and chills.  HENT: Positive for tinnitus.   Eyes: Negative for blurred vision and double vision.  Respiratory: Negative for cough and shortness of breath.   Cardiovascular: Negative for chest pain and palpitations.  Gastrointestinal: Positive for constipation. Negative for abdominal pain.  Genitourinary: Negative for dysuria and hematuria.  Musculoskeletal: Positive for myalgias and joint pain.  Skin: Negative for rash.  Neurological: Positive for  weakness.  All other systems reviewed and are negative.  Past Medical History  Diagnosis Date  . History of chicken pox   . Hyperlipidemia   . History of DVT of lower extremity     right  . Left hip pain 03/09/2013  . Pneumonia   . Chronic kidney disease   . Protein malnutrition ([place]) 08/2015  . HIV (human immunodeficiency virus infection) [facility])    Past Surgical History  Procedure Laterality Date  . Appendectomy  1962  . Leg amputation above knee  2010    right leg   Family History  Problem Relation Age of Onset  . Arthritis Mother   . Kidney disease Maternal Grandfather    Social History:  reports that he has been smoking Cigarettes.  He has a 18 pack-year smoking history. He has never used smokeless tobacco. He reports that he does not drink alcohol or use illicit drugs. Allergies:  Allergies  Allergen Reactions  . Dapsone     REACTION: fever  . Sulfamethoxazole-Trimethoprim     REACTION: fever   Medications Prior to Admission  Medication Sig Dispense Refill  . aspirin 81 MG tablet Take 81 mg by mouth daily.      Marland Kitchen ENSURE (ENSURE) Take 1 Can by mouth 3 (three) times daily between meals. 237 mL prn  . fenofibrate (TRICOR) 145 MG tablet Take 1 tablet (145 mg total) by mouth daily. 30 tablet 5  . gabapentin (NEURONTIN) 400 MG capsule TAKE ONE CAPSULE BY MOUTH FOUR TIMES DAILY 120 capsule 5  . morphine (MS CONTIN) 60 MG 12 hr tablet Take 2 tablets (120 mg total) by mouth every 12 (twelve) hours. 120 tablet 0  . oxyCODONE (OXY IR/ROXICODONE) 5 MG immediate release tablet Take 1 tablet (5 mg total) by mouth every 6 (six) hours as needed for severe pain.  60 tablet 0  . potassium chloride SA (K-DUR,KLOR-CON) 20 MEQ tablet TAKE 1 TABLET BY MOUTH EVERY DAY 30 tablet 3  . [name] 50 MG tablet TAKE 1 TABLET (50 MG TOTAL) BY MOUTH DAILY. 30 tablet 11  . [name] 300 MG tablet TAKE 1 TABLET (300 MG TOTAL) BY MOUTH DAILY. 30 tablet 11  . warfarin (COUMADIN) 5 MG tablet Take 2.5-5 mg by  mouth daily. On Sunday Monday Wednesday Friday patient takes 2.5 mg and on all other days patient takes 5 mg.    . EVOTAZ 300-150 MG per tablet TAKE 1 TABLET BY MOUTH DAILY. SWALLOW WHOLE. DO NOT CRUSH, CUT OR CHEW TABLET. TAKE WITH FOOD. 30 tablet 11    Home: Home Living Family/patient expects to be discharged to:: Private residence Living Arrangements: Children (daughter) Available Help at Discharge: Family, Available 24 hours/day Type of Home: House Home Access: Stairs to enter, Ramped entrance Technical brewer of Steps: did not state Home Layout: One [place]: Environmental consultant - 2 wheels, Wheelchair - manual, Bedside commode Additional Comments: prosthesis  Functional History: Prior Function Level of Independence: Independent with assistive device(s) Functional Status:  Mobility: Bed Mobility Overal bed mobility: Needs Assistance Bed Mobility: Supine to Sit Supine to sit: Mod assist General bed mobility comments: already in the chair Transfers Overall transfer level: Needs assistance Equipment used: Rolling walker (2 wheeled) Transfers: Sit to/from Stand Sit to Stand: Min assist Stand pivot transfers: Min assist Squat pivot transfers: Mod assist General transfer comment: cues for hand placement.  3 standing trials. Ambulation/Gait Ambulation/Gait assistance: Min assist, Mod assist Ambulation Distance (Feet): 4 Feet (forward and back x2  (more difficulty backing up), feet forw) Assistive device: Rolling walker (2 wheeled) Gait Pattern/deviations: Step-to pattern General Gait Details: slow and steady swing forward.    ADL:    Cognition: Cognition Overall Cognitive Status: Within Functional Limits for tasks assessed Orientation Level: Oriented X4 Cognition Arousal/Alertness: Lethargic, Awake/alert Behavior During Therapy: WFL for tasks assessed/performed Overall Cognitive Status: Within Functional Limits for tasks assessed  Blood pressure 111/64, pulse  98, temperature 98.7 F (37.1 C), temperature source Oral, resp. rate 18, height 6' (1.829 m), weight 42.1 kg (92 lb 13 oz), SpO2 100 %. Physical Exam  Vitals reviewed. Constitutional: No distress.  54 year old cachectic African-American male appearing older than stated age  HENT:  Head: Normocephalic and atraumatic.  Eyes: Conjunctivae and EOM are normal.  Neck: Normal range of motion. Neck supple. No thyromegaly present.  Cardiovascular: Normal rate and regular rhythm.   Respiratory: Effort normal and breath sounds normal.  GI: Soft. Bowel sounds are normal. He exhibits no distension.  Musculoskeletal: He exhibits no edema or tenderness.  Right AKA  Neurological: He is alert.  Mood is agitated.  He is oriented to person, place and age Sensation intact to light touch throughout Motor: 4-/5 throughout.    Skin: Skin is warm and dry.  AKA site is healed. Sacral ulcer not examined  Psychiatric: He is agitated. Cognition and memory are impaired.    Results for orders placed or performed during the hospital encounter of 09/10/15 (from the past 24 hour(s))  CBC     Status: Abnormal   Collection Time: 09/16/15  3:23 PM  Result Value Ref Range   WBC 16.3 (H) 4.0 - 10.5 K/uL   RBC 2.71 (L) 4.22 - 5.81 MIL/uL   Hemoglobin 7.7 (L) 13.0 - 17.0 g/dL   HCT 23.3 (L) 39.0 - 52.0 %   MCV 86.0 78.0 - 100.0  fL   MCH 28.4 26.0 - 34.0 pg   MCHC 33.0 30.0 - 36.0 g/dL   RDW 17.8 (H) 11.5 - 15.5 %   Platelets 255 150 - 400 K/uL  Protime-INR     Status: Abnormal   Collection Time: 09/16/15  3:23 PM  Result Value Ref Range   Prothrombin Time 16.6 (H) 11.6 - 15.2 seconds   INR 1.33 0.00 - 1.49  Comprehensive metabolic panel     Status: Abnormal   Collection Time: 09/16/15  3:23 PM  Result Value Ref Range   Sodium 144 135 - 145 mmol/L   Potassium 3.0 (L) 3.5 - 5.1 mmol/L   Chloride 127 (H) 101 - 111 mmol/L   CO2 11 (L) 22 - 32 mmol/L   Glucose, Bld 97 65 - 99 mg/dL   BUN 6 6 - 20 mg/dL    Creatinine, Ser 1.11 0.61 - 1.24 mg/dL   Calcium 7.4 (L) 8.9 - 10.3 mg/dL   Total Protein 5.2 (L) 6.5 - 8.1 g/dL   Albumin 2.4 (L) 3.5 - 5.0 g/dL   AST 40 15 - 41 U/L   ALT 24 17 - 63 U/L   Alkaline Phosphatase 86 38 - 126 U/L   Total Bilirubin 0.8 0.3 - 1.2 mg/dL   GFR calc non Af Amer >60 >60 mL/min   GFR calc Af Amer >60 >60 mL/min   Anion gap 6 5 - 15  Magnesium     Status: Abnormal   Collection Time: 09/16/15  3:23 PM  Result Value Ref Range   Magnesium 1.4 (L) 1.7 - 2.4 mg/dL  Phosphorus     Status: Abnormal   Collection Time: 09/16/15  3:23 PM  Result Value Ref Range   Phosphorus 2.1 (L) 2.5 - 4.6 mg/dL  Glucose, capillary     Status: Abnormal   Collection Time: 09/16/15  4:39 PM  Result Value Ref Range   Glucose-Capillary 148 (H) 65 - 99 mg/dL  Glucose, capillary     Status: None   Collection Time: 09/17/15 12:32 AM  Result Value Ref Range   Glucose-Capillary 87 65 - 99 mg/dL  Protime-INR     Status: Abnormal   Collection Time: 09/17/15  5:35 AM  Result Value Ref Range   Prothrombin Time 16.7 (H) 11.6 - 15.2 seconds   INR 1.34 0.00 - 1.49  Comprehensive metabolic panel     Status: Abnormal   Collection Time: 09/17/15  5:35 AM  Result Value Ref Range   Sodium 142 135 - 145 mmol/L   Potassium 2.6 (LL) 3.5 - 5.1 mmol/L   Chloride 122 (H) 101 - 111 mmol/L   CO2 14 (L) 22 - 32 mmol/L   Glucose, Bld 95 65 - 99 mg/dL   BUN 6 6 - 20 mg/dL   Creatinine, Ser 1.21 0.61 - 1.24 mg/dL   Calcium 7.3 (L) 8.9 - 10.3 mg/dL   Total Protein 5.0 (L) 6.5 - 8.1 g/dL   Albumin 2.2 (L) 3.5 - 5.0 g/dL   AST 34 15 - 41 U/L   ALT 23 17 - 63 U/L   Alkaline Phosphatase 82 38 - 126 U/L   Total Bilirubin 0.5 0.3 - 1.2 mg/dL   GFR calc non Af Amer >60 >60 mL/min   GFR calc Af Amer >60 >60 mL/min   Anion gap 6 5 - 15  Magnesium     Status: Abnormal   Collection Time: 09/17/15  5:35 AM  Result Value Ref Range  Magnesium 1.4 (L) 1.7 - 2.4 mg/dL  Phosphorus     Status: Abnormal    Collection Time: 09/17/15  5:35 AM  Result Value Ref Range   Phosphorus 1.6 (L) 2.5 - 4.6 mg/dL  Glucose, capillary     Status: None   Collection Time: 09/17/15  5:35 AM  Result Value Ref Range   Glucose-Capillary 95 65 - 99 mg/dL   No results found.  Assessment/Plan: Diagnosis: Debility 1. Does the need for close, 24 hr/day medical supervision in concert with the patient's rehab needs make it unreasonable for this patient to be served in a less intensive setting? Yes 2. Co-Morbidities requiring supervision/potential complications: HIV (cont meds), previous right AKA (use prosthesis as much as possible), hypokalemia (replete as necessary), hypophosphatemia (replete as necessary), hypoprotenemia (consider supplement), anemia due to GI bleed (transfuse if necessary to ensure appropriate perfusion for increased activity tolerance), pain, Etoh abuse (cont counseling, watch for withdrawal), pressure ulcer (cont offloading and pressure relief) 3. Due to bladder management, bowel management, safety, skin/wound care, disease management, medication administration, pain management and patient education, does the patient require 24 hr/day rehab nursing? Yes 4. Does the patient require coordinated care of a physician, rehab nurse, PT (1-2 hrs/day, 5 days/week), OT (1-2 hrs/day, 5 days/week) and SLP (1-2 hrs/day, 5 days/week) to address physical and functional deficits in the context of the above medical diagnosis(es)? Yes Addressing deficits in the following areas: balance, endurance, locomotion, strength, transferring, bowel/bladder control, bathing, dressing, feeding, grooming, toileting, swallowing and psychosocial support 5. Can the patient actively participate in an intensive therapy program of at least 3 hrs of therapy per day at least 5 days per week? Yes 6. The potential for patient to make measurable gains while on inpatient rehab is good 7. Anticipated functional outcomes upon discharge from  inpatient rehab are modified independent  with PT, modified independent with OT, modified independent with SLP. 8. Estimated rehab length of stay to reach the above functional goals is: 7-10 days. 9. Does the patient have adequate social supports and living environment to accommodate these discharge functional goals? Potentially 10. Anticipated D/C setting: Home 11. Anticipated post D/C treatments: HH therapy and Home excercise program 12. Overall Rehab/Functional Prognosis: good  RECOMMENDATIONS: This patient's condition is appropriate for continued rehabilitative care in the following setting: Pt has not had PT in several days and most recent session was without prosthesis.  Would appreciate reevaluation from PT.  If at that time, pt continues to requre IRF services, would recomment IRF once medical issues have stabalized (stablization of Hb/resolution of hematemesis) and pt agrees to participate in3 hours therapy/day. Patient has agreed to participate in recommended program. Potentially Note that insurance prior authorization may be required for reimbursement for recommended care.  Comment: Rehab Admissions Coordinator to follow up.   Delice Lesch, MD 09/17/2015

## 2015-09-17 NOTE — Progress Notes (Signed)
Pt continued to be combative, security called, pt trying to get out of bed. Dr. Loleta Chance notified and ativan 1 mg IV given and an order for bilateral soft wrist restraints obtained.

## 2015-09-17 NOTE — Care Management Note (Signed)
Case Management Note  Patient Details  Name: Jones Strimple MRN: [id number] Date of Birth: 01-08-1961  Subjective/Objective:  Pt lives with dtr who feels she needs some assistance, inquiring about home health services.  Provided information about home health services and personal care services through [place].  Provided application for personal care services per request.                                Expected Discharge Plan:  [place]  Discharge planning Services  CM Consult  Status of Service:  In process, will continue to follow  Medicare Important Message Given:  Yes  Girard Cooter, RN 09/17/2015, 7:49 AM

## 2015-09-17 NOTE — Progress Notes (Signed)
Pt very combative, verbally abusive and restless, pt transferred to room 3E17 , a camera room from 3E22 for close monitoring.

## 2015-09-17 NOTE — Progress Notes (Signed)
Called and spoke to Dr. Marlowe Sax to verify that the pt is ordered to receive both sodium bicarb 150 mEq in D5 at 100 mL/h AND D5 at 125 mL/h.  She affirmed these orders as written.  Will administer to pt as ordered.

## 2015-09-18 LAB — RENAL FUNCTION PANEL
ANION GAP: 5 (ref 5–15)
ANION GAP: 5 (ref 5–15)
Albumin: 2.2 g/dL — ABNORMAL LOW (ref 3.5–5.0)
Albumin: 2.2 g/dL — ABNORMAL LOW (ref 3.5–5.0)
BUN: <5 mg/dL — ABNORMAL LOW (ref 6–20)
BUN: <5 mg/dL — ABNORMAL LOW (ref 6–20)
CALCIUM: 7.3 mg/dL — AB (ref 8.9–10.3)
CALCIUM: 7.1 mg/dL — AB (ref 8.9–10.3)
CO2: 15 mmol/L — AB (ref 22–32)
CO2: 14 mmol/L — AB (ref 22–32)
Chloride: 121 mmol/L — ABNORMAL HIGH (ref 101–111)
Chloride: 119 mmol/L — ABNORMAL HIGH (ref 101–111)
Creatinine, Ser: 1.01 mg/dL (ref 0.61–1.24)
Creatinine, Ser: 0.94 mg/dL (ref 0.61–1.24)
GFR calc non Af Amer: >60 mL/min (ref >60)
GLUCOSE: 108 mg/dL — AB (ref 65–99)
Glucose, Bld: 147 mg/dL — ABNORMAL HIGH (ref 65–99)
PHOSPHORUS: 2.2 mg/dL — AB (ref 2.5–4.6)
POTASSIUM: 2.4 mmol/L — AB (ref 3.5–5.1)
Phosphorus: 1.3 mg/dL — ABNORMAL LOW (ref 2.5–4.6)
Potassium: 2.6 mmol/L — CL (ref 3.5–5.1)
SODIUM: 141 mmol/L (ref 135–145)
SODIUM: 138 mmol/L (ref 135–145)

## 2015-09-18 LAB — CBC WITH DIFFERENTIAL/PLATELET
Basophils Absolute: 0 10*3/uL (ref 0.0–0.1)
Basophils Relative: 0 %
Eosinophils Absolute: 0.1 10*3/uL (ref 0.0–0.7)
Eosinophils Relative: 1 %
HEMATOCRIT: 21 % — AB (ref 39.0–52.0)
HEMOGLOBIN: 7.2 g/dL — AB (ref 13.0–17.0)
LYMPHS ABS: 2.3 10*3/uL (ref 0.7–4.0)
LYMPHS PCT: 22 %
MCH: 29.4 pg (ref 26.0–34.0)
MCHC: 34.3 g/dL (ref 30.0–36.0)
MCV: 85.7 fL (ref 78.0–100.0)
MONO ABS: 0.4 10*3/uL (ref 0.1–1.0)
MONOS PCT: 4 %
NEUTROS ABS: 7.8 10*3/uL — AB (ref 1.7–7.7)
NEUTROS PCT: 74 %
Platelets: 301 10*3/uL (ref 150–400)
RBC: 2.45 MIL/uL — ABNORMAL LOW (ref 4.22–5.81)
RDW: 17.4 % — AB (ref 11.5–15.5)
WBC: 10.6 10*3/uL — ABNORMAL HIGH (ref 4.0–10.5)

## 2015-09-18 LAB — MAGNESIUM: Magnesium: 1.7 mg/dL (ref 1.7–2.4)

## 2015-09-18 LAB — GLUCOSE, CAPILLARY
GLUCOSE-CAPILLARY: 162 mg/dL — AB (ref 65–99)
GLUCOSE-CAPILLARY: 114 mg/dL — AB (ref 65–99)
Glucose-Capillary: 110 mg/dL — ABNORMAL HIGH (ref 65–99)
Glucose-Capillary: 191 mg/dL — ABNORMAL HIGH (ref 65–99)

## 2015-09-18 LAB — PROTIME-INR
INR: 1.3 (ref 0.00–1.49)
Prothrombin Time: 16.3 seconds — ABNORMAL HIGH (ref 11.6–15.2)

## 2015-09-18 MED ORDER — OXYCODONE HCL 5 MG PO TABS
5.0000 mg | ORAL_TABLET | Freq: Four times a day (QID) | ORAL | Status: DC | PRN
Start: 2015-09-18 — End: 2015-09-22
  Administered 2015-09-18 – 2015-09-22 (×9): 5 mg via ORAL
  Filled 2015-09-18 (×9): qty 1

## 2015-09-18 MED ORDER — ENSURE ENLIVE PO LIQD
237.0000 mL | Freq: Three times a day (TID) | ORAL | Status: DC
  Administered 2015-09-18 – 2015-09-20 (×7): 237 mL via ORAL

## 2015-09-18 MED ORDER — ENSURE PO LIQD: 1.0000 | Freq: Three times a day (TID) | ORAL | Status: DC

## 2015-09-18 MED ORDER — POTASSIUM CHLORIDE 10 MEQ/50ML IV SOLN
10.0000 meq | INTRAVENOUS | Status: DC
  Filled 2015-09-18 (×6): qty 50

## 2015-09-18 MED ORDER — POTASSIUM CHLORIDE CRYS ER 20 MEQ PO TBCR: 40.0000 meq | EXTENDED_RELEASE_TABLET | Freq: Two times a day (BID) | ORAL | Status: DC

## 2015-09-18 MED ORDER — POTASSIUM CHLORIDE 10 MEQ/50ML IV SOLN
10.0000 meq | INTRAVENOUS | Status: AC
  Administered 2015-09-18 (×6): 10 meq via INTRAVENOUS
  Filled 2015-09-18 (×6): qty 50

## 2015-09-18 MED ORDER — SODIUM CHLORIDE 0.9 % IJ SOLN
10.0000 mL | INTRAMUSCULAR | Status: DC | PRN
  Administered 2015-09-19: 10 mL
  Administered 2015-09-20: 30 mL
  Administered 2015-09-21: 10 mL
  Administered 2015-09-21 – 2015-09-22 (×2): 30 mL
  Filled 2015-09-18 (×5): qty 40

## 2015-09-18 MED ORDER — MAGNESIUM SULFATE 4 GM/100ML IV SOLN
4.0000 g | Freq: Once | INTRAVENOUS | Status: AC
  Administered 2015-09-18: 4 g via INTRAVENOUS
  Filled 2015-09-18: qty 100

## 2015-09-18 MED ORDER — POTASSIUM CHLORIDE 10 MEQ/50ML IV SOLN
10.0000 meq | INTRAVENOUS | Status: AC
  Administered 2015-09-18 – 2015-09-19 (×6): 10 meq via INTRAVENOUS
  Filled 2015-09-18 (×6): qty 50

## 2015-09-18 NOTE — Progress Notes (Signed)
[facility] ROUNDING NOTE   Subjective:   Interval History: appears more comfortable today --  lying in bed  Objective:  Vital signs in last 24 hours:  Temp:  [97.6 F (36.4 C)-98.2 F (36.8 C)] 98.2 F (36.8 C) (11/19 1300) Pulse Rate:  [69-139] 139 (11/19 1300) Resp:  [18-22] 18 (11/19 1300) BP: (109-147)/(59-74) 118/61 mmHg (11/19 1300) SpO2:  [98 %] 98 % (11/19 1300) Weight:  [41.4 kg (91 lb 4.3 oz)] 41.4 kg (91 lb 4.3 oz) (11/19 0300)  Weight change:  Filed Weights   09/15/15 0500 09/17/15 1309 09/18/15 0300  Weight: 45.4 kg (100 lb 1.4 oz) 42.1 kg (92 lb 13 oz) 41.4 kg (91 lb 4.3 oz)    Intake/Output: I/O last 3 completed shifts: In: 8492.3 [P.O.:480; I.V.:5427.9; IV Piggyback:856.7] Out: 9675 [Urine:9675]   Intake/Output this shift:  Total I/O In: 2583.3 [P.O.:480; I.V.:1803.3; IV Piggyback:300] Out: 3000 [Urine:3000]  General: Very thin, cachectic HEENT: mucous membranes dry Neck: There is no JVD Heart: Regular rate and rhythm Lungs: Clear to auscultation bilaterally Abdomen:  Flat non tender Extremities: No edema Neuro: Alert and nonfocal- some difficulty in understanding his speech   Basic Metabolic Panel:  Recent Labs Lab 09/14/15 0600  09/15/15 0640 09/15/15 1900 09/16/15 1523 09/17/15 0535 09/18/15 0450  NA 143  < > 146* 144 144 142 141  K 2.5*  < > 3.1* 3.3* 3.0* 2.6* 2.4*  CL 124*  < > 127* 126* 127* 122* 121*  CO2 14*  < > 13* 13* 11* 14* 15*  GLUCOSE 128*  < > 135* 129* 97 95 108*  BUN 5*  < > 5* 5* 6 6 <5*  CREATININE 1.24  < > 1.11 1.06 1.11 1.21 1.01  CALCIUM 7.1*  < > 7.2* 7.0* 7.4* 7.3* 7.3*  MG 1.6*  --  1.8  --  1.4* 1.4* 1.7  PHOS 1.4*  --  1.9*  --  2.1* 1.6* 2.2*  < > = values in this interval not displayed.  Liver Function Tests:  Recent Labs Lab 09/12/15 0256  09/14/15 0600 09/15/15 0640 09/16/15 1523 09/17/15 0535 09/18/15 0450  AST 49*  --  37 28 40 34  --   ALT 28  --  22 20 24 23   --    ALKPHOS 71  --  72 77 86 82  --   BILITOT 1.4*  --  0.9 0.9 0.8 0.5  --   PROT 4.7*  --  4.5* 4.7* 5.2* 5.0*  --   ALBUMIN 2.2*  < > 2.1*  2.0* 2.1* 2.4* 2.2* 2.2*  < > = values in this interval not displayed. No results for input(s): LIPASE, AMYLASE in the last 168 hours. No results for input(s): AMMONIA in the last 168 hours.  CBC:  Recent Labs Lab 09/12/15 0256 09/12/15 1729 09/13/15 0347 09/14/15 0600 09/16/15 1523 09/18/15 0450  WBC 15.9* 15.6* 14.4* 16.9* 16.3* 10.6*  NEUTROABS 13.0* 12.5* 11.5*  --   --  7.8*  HGB 8.4* 8.6* 8.2* 8.9* 7.7* 7.2*  HCT 24.7* 25.4* 24.6* 26.4* 23.3* 21.0*  MCV 83.4 84.1 85.1 84.9 86.0 85.7  PLT 199 207 217 228 255 301    Cardiac Enzymes: No results for input(s): CKTOTAL, CKMB, CKMBINDEX, TROPONINI in the last 168 hours.  BNP: Invalid input(s): POCBNP  CBG:  Recent Labs Lab 09/17/15 1655 09/17/15 2207 09/18/15 0611 09/18/15 1131 09/18/15 1632  GLUCAP 94 91 110* 162* 114*    Microbiology: Results for orders  placed or performed during the hospital encounter of 09/10/15  MRSA PCR Screening     Status: None   Collection Time: 09/10/15  6:23 PM  Result Value Ref Range Status   MRSA by PCR NEGATIVE NEGATIVE Final    Comment:        The GeneXpert MRSA Assay (FDA approved for NASAL specimens only), is one component of a comprehensive MRSA colonization surveillance program. It is not intended to diagnose MRSA infection nor to guide or monitor treatment for MRSA infections.   Culture, blood (routine x 2)     Status: None   Collection Time: 09/10/15 11:25 PM  Result Value Ref Range Status   Specimen Description BLOOD LEFT ANTECUBITAL  Final   Special Requests BOTTLES DRAWN AEROBIC AND ANAEROBIC 10CC   Final   Culture NO GROWTH 5 DAYS  Final   Report Status 09/15/2015 FINAL  Final  Culture, blood (routine x 2)     Status: None   Collection Time: 09/10/15 11:30 PM  Result Value Ref Range Status   Specimen Description  BLOOD RIGHT ARM  Final   Special Requests BOTTLES DRAWN AEROBIC AND ANAEROBIC 5CC  Final   Culture NO GROWTH 5 DAYS  Final   Report Status 09/16/2015 FINAL  Final    Coagulation Studies:  Recent Labs  09/16/15 1523 09/17/15 0535 09/18/15 0450  LABPROT 16.6* 16.7* 16.3*  INR 1.33 1.34 1.30    Urinalysis: No results for input(s): COLORURINE, LABSPEC, PHURINE, GLUCOSEU, HGBUR, BILIRUBINUR, KETONESUR, PROTEINUR, UROBILINOGEN, NITRITE, LEUKOCYTESUR in the last 72 hours.  Invalid input(s): APPERANCEUR    Imaging: No results found.   Medications:   . dextrose 125 mL/hr at 09/18/15 0007  .  sodium bicarbonate  infusion 1000 mL 100 mL/hr at 09/18/15 0006   . antiseptic oral rinse  7 mL Mouth Rinse q12n4p  . chlorhexidine  15 mL Mouth Rinse BID  . enoxaparin (LOVENOX) injection  40 mg Subcutaneous Q24H  . feeding supplement (ENSURE ENLIVE)  237 mL Oral TID BM  . insulin aspart  0-9 Units Subcutaneous 4 times per day  . pantoprazole (PROTONIX) IV  40 mg Intravenous Q24H   acetaminophen, oxyCODONE, sodium chloride  Assessment/ Plan:  1.Renal- acute renal failure likely due to previous sepsis and likely volume depletion. Renal function is now normal and Is making good urine.  2. Metabolic acidosis - is non-gap. This appears secondary to a Renal tubular acidosis IV or oral bicarbonate can be given ( most likely a proximal defect )  3. Hypernatremia and hyperchloremia- likely secondary to a lot of normal saline given at the other hospital. Improved  4. Hypokalemia- agree with repletion Will need Again consistent with RTA Distal vs proximal ( appears clinically like a proximal injury )  Will check tonight as K is very low 5. Anemia - significant in nature. Possibly some due to HIV and recent significant hospitalization as well as acute kidney injury. Supportive care for now 6. High urine output -- DDAVP given for possible diabetes insipidus -- this may be  acceptable considering normal renal function and high output    LOS: 8 [name] @TODAY @6 :04 PM

## 2015-09-18 NOTE — Progress Notes (Signed)
Internal Medicine Attending:   I saw and examined the patient. I reviewed the resident's note and I agree with the resident's findings and plan as documented in the resident's note.  Complicated patient who is symptomatically improving. His abdominal pain is much better, now day three from NG tube decompression. Appetite remains low and has been slow to increase. Labs this morning show severe hypokalemia, so we will replete with 60 meq of K by IV this morning and recheck BMP later this afternoon. He will probably need more repletion overnight. Sodium is improved, we are trying to match his free water infusion with his post-ATN diuresis.

## 2015-09-18 NOTE — Evaluation (Signed)
Clinical/Bedside Swallow Evaluation Patient Details  Name: [name] MRN: [id number] Date of Birth: 1961-02-04  Today's Date: 09/18/2015 Time: SLP Start Time (ACUTE ONLY): [id number] SLP Stop Time (ACUTE ONLY): 1156 SLP Time Calculation (min) (ACUTE ONLY): 20 min  Past Medical History:  Past Medical History  Diagnosis Date  . History of chicken pox   . Hyperlipidemia   . History of DVT of lower extremity     right  . Left hip pain 03/09/2013  . Pneumonia   . Chronic kidney disease   . Protein malnutrition ([place]) 08/2015  . HIV (human immunodeficiency virus infection) ([place])    Past Surgical History:  Past Surgical History  Procedure Laterality Date  . Appendectomy  1962  . Leg amputation above knee  2010    right leg   HPI:  [name] is a 54 y.o. 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 also reported alcoholism. Presented in transfer from [facility] 09/10/2015 with dark tarry stools and hematemesis. INR greater than 10 and reversed. Found to be acidotic, hypernatremic/hypokalemic required intubation. Chest x-ray bilateral pneumonia question aspiration. Placed on broad-spectrum antibiotics. He was transfused. Blood cultures positive for Klebsiella. KUB of the abdomen showed partial small bowel obstruction requiring nasogastric tube. Has since resumed mechanical soft and thin liquid diet. Pt with a hx of agitation and physical and verbal aggression. Most recently RN reports refusal of medicines and breakfast meal.     Assessment / Plan / Recommendation Clinical Impression  Pt with mild oral dysphagia impacted by edentulous oral cavity. Pt with hx of agression and combative behavior limiting PO trials of all consistency (denied solid PO trials). However suspect that pt would exhibit poor mastication of solid PO given no dentition.  SLP observed thin  and puree consistencies without overt signs or symptoms of reduced airway protection. Pt most responsive and cooperative to liquids offered via straw sip. Behavioral implications impact nutritional needs; RN reports refusal of am breakfast and PO meds. SLP with great response from pt for consumption with thin liquids via straw sip. Anticipate improvement in PO intake and caloric needs with thin liquid supplements. Recommend dietitian consult. Continue dysphagia 3 (mechanical soft) and thin liquid diet. Recommend continue medicines via alternative means based upon behavioral history and refusal of PO meds. ST to follow up x1 to ensure diet tolerance of solid consistencies given limited trials.     Aspiration Risk  Mild aspiration risk    Diet Recommendation     Medication Administration: Via alternative means (secondary to behavioral issues and inconsistent refusal )    Other  Recommendations Oral Care Recommendations: Oral care BID   Follow up Recommendations       Frequency and Duration min 1 x/week  1 week       Swallow Study   General Date of Onset: 09/10/15 HPI: [name] is a 54 y.o. 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 also reported alcoholism. Presented in transfer from [facility] 09/10/2015 with dark tarry stools and hematemesis. INR greater than 10 and reversed. Found to be acidotic, hypernatremic/hypokalemic required intubation. Chest x-ray bilateral pneumonia question aspiration. Placed on broad-spectrum antibiotics. He was transfused. Blood cultures positive for Klebsiella. KUB of the abdomen showed partial small bowel obstruction  requiring nasogastric tube. Has since resumed mechanical soft and thin liquid diet. Pt with a hx of agitation and physical and verbal aggression. Most recently RN reports refusal of medicines and breakfast meal.    Type of Study: Bedside Swallow Evaluation Diet Prior to this Study: Dysphagia 3 (soft);Thin liquids Temperature Spikes Noted: No Respiratory Status: Room air History of Recent Intubation: Yes Length of Intubations (days):  (intubation at [facility], undocumented length) Date extubated:  (at previous level of care ([facility]) ) Behavior/Cognition: Alert Oral Cavity Assessment: Within Functional Limits Oral Cavity - Dentition: Edentulous Self-Feeding Abilities: Other (Comment) (pt restrained with combative hx, did not attempt removal) Patient Positioning: Upright in bed Baseline Vocal Quality: Low vocal intensity Volitional Swallow: Able to elicit    Oral/Motor/Sensory Function Overall Oral Motor/Sensory Function: Within functional limits   Ice Chips Ice chips: Not tested   Thin Liquid Thin Liquid: Within functional limits Presentation: Straw;Cup    Nectar Thick Nectar Thick Liquid: Not tested   Honey Thick Honey Thick Liquid: Not tested   Puree Puree: Within functional limits   Solid Solid:  (pt refused solid PO trials )      Arvil Chaco MA, CCC-SLP Acute Care Speech Language Pathologist    Arvil Chaco E 09/18/2015,12:59 PM

## 2015-09-18 NOTE — Progress Notes (Signed)
[facility] Progress Note     Subjective: Pt okay, denies pain or N/V, tolerating fulls, did have a small BM this am and one yesterday.  Having flatus.  Answers my questions grudgingly.    Objective: Vital signs in last 24 hours: Temp:  [97.6 F (36.4 C)-98.7 F (37.1 C)] 97.6 F (36.4 C) (11/19 0843) Pulse Rate:  [69-106] 91 (11/19 0843) Resp:  [17-23] 18 (11/19 0843) BP: (100-147)/(59-74) 111/59 mmHg (11/19 0843) SpO2:  [100 %] 100 % (11/18 1309) Weight:  [41.4 kg (91 lb 4.3 oz)-42.1 kg (92 lb 13 oz)] 41.4 kg (91 lb 4.3 oz) (11/19 0300) Last BM Date: 09/16/15  Intake/Output from previous day: 11/18 0701 - 11/19 0700 In: 6992.3 [P.O.:480; I.V.:4227.9; IV Piggyback:556.7; TPN:1727.7] Out: 7875 [Urine:7875] Intake/Output this shift: Total I/O In: 0  Out: 1000 [Urine:1000]  PE: Gen:  Alert, NAD, angry looking, has mitts on Abd: Soft, mild distension, NT, +BS, no HSM   Lab Results:   Recent Labs  09/16/15 1523 09/18/15 0450  WBC 16.3* 10.6*  HGB 7.7* 7.2*  HCT 23.3* 21.0*  PLT 255 301   BMET  Recent Labs  09/17/15 0535 09/18/15 0450  NA 142 141  K 2.6* 2.4*  CL 122* 121*  CO2 14* 15*  GLUCOSE 95 108*  BUN 6 <5*  CREATININE 1.21 1.01  CALCIUM 7.3* 7.3*   PT/INR  Recent Labs  09/17/15 0535 09/18/15 0450  LABPROT 16.7* 16.3*  INR 1.34 1.30   CMP     Component Value Date/Time   NA 141 09/18/2015 0450   K 2.4* 09/18/2015 0450   CL 121* 09/18/2015 0450   CO2 15* 09/18/2015 0450   GLUCOSE 108* 09/18/2015 0450   BUN <5* 09/18/2015 0450   CREATININE 1.01 09/18/2015 0450   CREATININE 1.10 04/19/2015 1450   CALCIUM 7.3* 09/18/2015 0450   PROT 5.0* 09/17/2015 0535   ALBUMIN 2.2* 09/18/2015 0450   AST 34 09/17/2015 0535   ALT 23 09/17/2015 0535   ALKPHOS 82 09/17/2015 0535   BILITOT 0.5 09/17/2015 0535   GFRNONAA >60 09/18/2015 0450   GFRNONAA 76 04/19/2015 1450   GFRAA >60 09/18/2015 0450   GFRAA 88 04/19/2015 1450   Lipase   No results found for: LIPASE     Studies/Results: No results found.  Anti-infectives: Anti-infectives    Start     Dose/Rate Route Frequency Ordered Stop   09/11/15 0000  Ampicillin-Sulbactam (UNASYN) 3 g in sodium chloride 0.9 % 100 mL IVPB  Status:  Discontinued     3 g 100 mL/hr over 60 Minutes Intravenous Every 8 hours 09/10/15 2347 09/16/15 1555       Assessment/Plan SBO - improving, advance to soft diet, okay to d/c home when medically stable, will sign off Hypokalemia - will need supplementation, looks like he's pending 6 runs MMP - per primary    LOS: 8 days    Nat Christen 09/18/2015, 10:17 AM Pager: [phone]

## 2015-09-18 NOTE — Progress Notes (Signed)
Subjective: Patient was agitated and combative again last night. Security was called and he was given Ativan 1 mg by night team. We saw him this morning. Nursing staff informed us that patient is refusing medications and not eating his breakfast. He denied having any pain anywhere. Patient told us he was at [facility] but refused to answer any other questions about orientation. Patient seemed agitated and did not want to answer any more questions and was not cooperative with the physical exam. Stated he wanted Cranberry juice and we requested nursing staff to order it from the cafeteria.   Objective: Vital signs in last 24 hours: Filed Vitals:   09/18/15 0220 09/18/15 0300 09/18/15 0359 09/18/15 0843  BP: 140/74  147/61 111/59  Pulse: 92  92 91  Temp:   97.8 F (36.6 C) 97.6 F (36.4 C)  TempSrc:   Axillary Axillary  Resp: '18  18 18  ' Height:      Weight:  91 lb 4.3 oz (41.4 kg)    SpO2:       Weight change:   Intake/Output Summary (Last 24 hours) at 09/18/15 0847 Last data filed at 09/18/15 0653  Gross per 24 hour  Intake 6942.25 ml  Output   7875 ml  Net -932.75 ml   Physical Exam  Constitutional: No distress.  Cachectic   Cardiovascular: Normal rate, regular rhythm and intact distal pulses.  Exam reveals no gallop and no friction rub.   No murmur heard. Pulmonary/Chest:  Deferred because patient was not cooperative.   Abdominal:  No bowel sounds appreciated. Rest of the exam deferred because patient was not cooperative.    Neurological: He is alert.  Orientation difficult to assess because patient was refusing to answer questions.   Skin: Skin is warm and dry.   Lab Results: Basic Metabolic Panel:  Recent Labs Lab 09/17/15 0535 09/18/15 0450  NA 142 141  K 2.6* 2.4*  CL 122* 121*  CO2 14* 15*  GLUCOSE 95 108*  BUN 6 <5*  CREATININE 1.21 1.01  CALCIUM 7.3* 7.3*  MG 1.4* 1.7  PHOS 1.6* 2.2*   Liver Function Tests:  Recent Labs Lab  09/16/15 1523 09/17/15 0535 09/18/15 0450  AST 40 34  --   ALT 24 23  --   ALKPHOS 86 82  --   BILITOT 0.8 0.5  --   PROT 5.2* 5.0*  --   ALBUMIN 2.4* 2.2* 2.2*   CBC:  Recent Labs Lab 09/13/15 0347  09/16/15 1523 09/18/15 0450  WBC 14.4*  < > 16.3* 10.6*  NEUTROABS 11.5*  --   --  7.8*  HGB 8.2*  < > 7.7* 7.2*  HCT 24.6*  < > 23.3* 21.0*  MCV 85.1  < > 86.0 85.7  PLT 217  < > 255 301  < > = values in this interval not displayed. Coagulation:  Recent Labs Lab 09/15/15 0640 09/16/15 1523 09/17/15 0535 09/18/15 0450  LABPROT 20.2* 16.6* 16.7* 16.3*  INR 1.73* 1.33 1.34 1.30   Urine Drug Screen: Drugs of Abuse     Component Value Date/Time   LABOPIA * 06/23/2009 1831    POSITIVE (NOTE) Result repeated and verified. Sent for confirmatory testing   COCAINSCRNUR NEGATIVE 06/23/2009 1831   LABBENZ NEGATIVE 06/23/2009 1831   AMPHETMU NEGATIVE 06/23/2009 1831    Micro Results: Recent Results (from the past 240 hour(s))  MRSA PCR Screening     Status: None   Collection Time: 09/10/15  6:23  PM  Result Value Ref Range Status   MRSA by PCR NEGATIVE NEGATIVE Final    Comment:        The GeneXpert MRSA Assay (FDA approved for NASAL specimens only), is one component of a comprehensive MRSA colonization surveillance program. It is not intended to diagnose MRSA infection nor to guide or monitor treatment for MRSA infections.   Culture, blood (routine x 2)     Status: None   Collection Time: 09/10/15 11:25 PM  Result Value Ref Range Status   Specimen Description BLOOD LEFT ANTECUBITAL  Final   Special Requests BOTTLES DRAWN AEROBIC AND ANAEROBIC 10CC   Final   Culture NO GROWTH 5 DAYS  Final   Report Status 09/15/2015 FINAL  Final  Culture, blood (routine x 2)     Status: None   Collection Time: 09/10/15 11:30 PM  Result Value Ref Range Status   Specimen Description BLOOD RIGHT ARM  Final   Special Requests BOTTLES DRAWN AEROBIC AND ANAEROBIC 5CC  Final    Culture NO GROWTH 5 DAYS  Final   Report Status 09/16/2015 FINAL  Final   Studies/Results: No results found. Medications: I have reviewed the patient's current medications. Scheduled Meds: . antiseptic oral rinse  7 mL Mouth Rinse q12n4p  . chlorhexidine  15 mL Mouth Rinse BID  . enoxaparin (LOVENOX) injection  40 mg Subcutaneous Q24H  . insulin aspart  0-9 Units Subcutaneous 4 times per day  . pantoprazole (PROTONIX) IV  40 mg Intravenous Q24H  . potassium chloride  10 mEq Intravenous Q1 Hr x 6   Continuous Infusions: . dextrose 125 mL/hr at 09/18/15 0007  .  sodium bicarbonate  infusion 1000 mL 100 mL/hr at 09/18/15 0006   PRN Meds:.acetaminophen, sodium chloride Assessment/Plan: Active Problems:   Human immunodeficiency virus (HIV) disease ([facility])   Alcohol abuse   Status post above knee amputation ([place])   Long term current use of anticoagulant   Avascular necrosis of femoral head ([facility])   Septic shock ([facility])   Gram-negative bacteremia (HCC)   Abdominal pain   Pneumonia due to Klebsiella pneumoniae ([facility])   Pressure ulcer   Protein-calorie malnutrition, severe   SBO (small bowel obstruction) ([facility])   Hyperosmolality and/or hypernatremia   Hypomagnesemia   Hypophosphatemia  Septic shock 2/2 to Klebsiella pneumoniae Bacteremia: likely from GI (aspiratin) vs lung: Resolved.  Was on pressors at [facility] Imaging shows bilateral pneumonia has improved since 11/8. Afebrile currently. Blood culture showing no growth in 5 days. Satting 100% on RA and BP stable. He has been treated with antibiotics for 10 days, initially started at the other hospital on admission there on November 7. Unasyn was discontinued on 11/17.   Partial SBO: Abdominal X-ray showing partial SBO, improved since 11/8. Patient was previously on NGT which he pulled out about 2 nights ago. He was previously given an enema and abdominal exam was improved last 2 days - no abdominal tenderness or distention.  However, bowel sounds could not be appreciated. He was tolerating PO intake (liquid diet) well for the past 2 days. - Hydromorphone 1 mg q4h prn - D5 fluid - Clear liquids diet  - Appreciate surgery recs  - SLP consult, appreciate recs   Hematemesis: Unclear source of upper GI bleed. Hgb 8.9 on 11/15, with baseline over 13. Likely in the setting of elevated INR during previous hospitalization. INR 1.3 today. - Protonix IV 40 mg - CBC in am  GB dysfunction: cholestasis, EF 27% per  HIDA.  - GI on board, appreciate recs  High INR without warfarin - with History of Unprovoked DVT:  Patient had consistently been supratherapeutic at anti-coag visits and presented to 10 at [facility]. We held Coumadin for this reason. High INR resolved now and patient was started on Lovenox yesterday.   - Lovenox per pharmacy  - F/u daily cbc and INR  Acute on Chronic Kidney Disease - likely 2/2 to ATN: Baseline creatinine of ~1.2. Was in 7's at outside hospital, 1.01 today.  - continue to monitor.  - F/u daily BMP  Non gap Metabolic Acidosis: AG is resolved as no longer has lactic acidosis but persistently has low bicarb (15 today).  - appreciate nephro recs. Continue IV bicarb.  Hypokalemia and hypoMag - K 2.6 and mag 1.4 this morning -IV KCl 10 meq q1 x6 + replete Mag as needed  Hypophosphatemia - Phosphorous improved to 2.2 today from 1.6 yesterday.  - check labs and replete as needed  Hypernatremia - likely 2/2 to free water deficit Received Normal Saline extensively before transferring to [place]. Na improved to 141 today -Continue D5  High urine output: Dose of DDAVP given on 11/16 for possible diabetes insipidus. Urine output 7.8 L in the past 24 hrs.  -Appreciate nephrology recs    HIV: CD4 decreased from 700 to 219 in a matter of 2 weeks, likely in the setting of acute illness. Viral load <20 in 07/2015.    -Restart anti-retroviral regimen when PO intake improves/ patient more cooperative with  oral meds  -F/u HLA B-57 (01)  Elevated AST: To 62. Likely downtrending from previous septic shock, but could represent underlying liver dysfunction driving up his INR. AST normal now.     Stage II pressure ulcer -Foam dressing as recommended by wound care   Avascular Necrosis of Hip and Phantom Limb Pain: Pain management per above.   DVT Prophylaxis: SCDs Code Status: Full Admit to: Step down  Dispo: Disposition is deferred at this time, awaiting improvement of current medical problems.  Anticipated discharge in approximately 2-3 day(s).   The patient does have a current PCP Iline Oven, MD) and does need an [facility] hospital follow-up appointment after discharge.  The patient does not have transportation limitations that hinder transportation to clinic appointments.  .Services Needed at time of discharge: Y = Yes, Blank = No PT:   OT:   RN:   Equipment:   Other:     LOS: 8 days   Shela Leff, MD 09/18/2015, 8:47 AM

## 2015-09-18 NOTE — Progress Notes (Signed)
CRITICAL VALUE ALERT  Critical value received:  K=2.4  Date of notification:  09/18/15  Time of notification:  06.16  Critical value read back: yes  Nurse who received alert:  G. Gaylin Osoria  MD notified (1st page):  Dr. Loleta Chance  Time of first page: 0620  MD notified (2nd page):  Time of second page:  Responding MD:  Dr. Loleta Chance  Time MD responded:  [phone]

## 2015-09-18 NOTE — Progress Notes (Signed)
Potassium 2.6. MD made aware

## 2015-09-19 LAB — GLUCOSE, CAPILLARY
GLUCOSE-CAPILLARY: 109 mg/dL — AB (ref 65–99)
GLUCOSE-CAPILLARY: 105 mg/dL — AB (ref 65–99)
GLUCOSE-CAPILLARY: 151 mg/dL — AB (ref 65–99)
Glucose-Capillary: 95 mg/dL (ref 65–99)

## 2015-09-19 LAB — RENAL FUNCTION PANEL
ANION GAP: 4 — AB (ref 5–15)
ANION GAP: 4 — AB (ref 5–15)
Albumin: 2.4 g/dL — ABNORMAL LOW (ref 3.5–5.0)
Albumin: 2.5 g/dL — ABNORMAL LOW (ref 3.5–5.0)
BUN: <5 mg/dL — ABNORMAL LOW (ref 6–20)
CHLORIDE: 121 mmol/L — AB (ref 101–111)
CO2: 14 mmol/L — AB (ref 22–32)
CO2: 14 mmol/L — AB (ref 22–32)
Calcium: 7.5 mg/dL — ABNORMAL LOW (ref 8.9–10.3)
Calcium: 7.4 mg/dL — ABNORMAL LOW (ref 8.9–10.3)
Chloride: 121 mmol/L — ABNORMAL HIGH (ref 101–111)
Creatinine, Ser: 1.01 mg/dL (ref 0.61–1.24)
Creatinine, Ser: 0.93 mg/dL (ref 0.61–1.24)
GFR calc Af Amer: >60 mL/min (ref >60)
GFR calc non Af Amer: >60 mL/min (ref >60)
GLUCOSE: 114 mg/dL — AB (ref 65–99)
Glucose, Bld: 123 mg/dL — ABNORMAL HIGH (ref 65–99)
POTASSIUM: 3.1 mmol/L — AB (ref 3.5–5.1)
POTASSIUM: 3.3 mmol/L — AB (ref 3.5–5.1)
Phosphorus: 1.1 mg/dL — ABNORMAL LOW (ref 2.5–4.6)
Phosphorus: 2.7 mg/dL (ref 2.5–4.6)
SODIUM: 139 mmol/L (ref 135–145)
Sodium: 139 mmol/L (ref 135–145)

## 2015-09-19 LAB — PROTIME-INR
INR: 1.33 (ref 0.00–1.49)
Prothrombin Time: 16.6 seconds — ABNORMAL HIGH (ref 11.6–15.2)

## 2015-09-19 LAB — CBC
HEMATOCRIT: 20.1 % — AB (ref 39.0–52.0)
HEMOGLOBIN: 6.8 g/dL — AB (ref 13.0–17.0)
MCH: 29.2 pg (ref 26.0–34.0)
MCHC: 33.8 g/dL (ref 30.0–36.0)
MCV: 86.3 fL (ref 78.0–100.0)
Platelets: 324 10*3/uL (ref 150–400)
RBC: 2.33 MIL/uL — AB (ref 4.22–5.81)
RDW: 17.9 % — ABNORMAL HIGH (ref 11.5–15.5)
WBC: 10.9 10*3/uL — ABNORMAL HIGH (ref 4.0–10.5)

## 2015-09-19 LAB — ABO/RH: ABO/RH(D): O POS

## 2015-09-19 LAB — PREPARE RBC (CROSSMATCH)

## 2015-09-19 LAB — MAGNESIUM: Magnesium: 2.2 mg/dL (ref 1.7–2.4)

## 2015-09-19 LAB — HEMOGLOBIN AND HEMATOCRIT, BLOOD
HEMATOCRIT: 26.1 % — AB (ref 39.0–52.0)
HEMOGLOBIN: 8.8 g/dL — AB (ref 13.0–17.0)

## 2015-09-19 MED ORDER — POTASSIUM PHOSPHATES 15 MMOLE/5ML IV SOLN
30.0000 mmol | Freq: Two times a day (BID) | INTRAVENOUS | Status: AC
  Administered 2015-09-19 (×2): 30 mmol via INTRAVENOUS
  Filled 2015-09-19 (×2): qty 10

## 2015-09-19 MED ORDER — DEXTROSE 5 % IV SOLN
3.0000 ug | Freq: Once | INTRAVENOUS | Status: AC
  Administered 2015-09-19: 3 ug via INTRAVENOUS
  Filled 2015-09-19: qty 0.75

## 2015-09-19 MED ORDER — SODIUM CHLORIDE 0.9 % IV SOLN: Freq: Once | INTRAVENOUS | Status: DC

## 2015-09-19 MED ORDER — POTASSIUM CHLORIDE 10 MEQ/50ML IV SOLN
10.0000 meq | INTRAVENOUS | Status: AC
  Administered 2015-09-19 (×6): 10 meq via INTRAVENOUS
  Filled 2015-09-19 (×6): qty 50

## 2015-09-19 MED ORDER — DEXTROSE 5 % IV SOLN
0.3000 ug/kg | Freq: Once | INTRAVENOUS | Status: DC
Start: 2015-09-19 — End: 2015-09-19

## 2015-09-19 NOTE — Progress Notes (Signed)
CRITICAL VALUE ALERT  Critical value received: hgb 6.8 Date of notification:  09/19/15 Time of notification: 0650 Critical value read back:yes  Nurse who received alert:  Kennieth Francois via Junius Creamer Torrie Lafavor  MD notified (1st page):  Time of first page:    MD notified (2nd page):  Time of second page:  Responding MD:   Time MD responded:

## 2015-09-19 NOTE — Progress Notes (Signed)
[facility] Progress Note     Subjective: Pt doing well, no abdominal pain or N/V.  Tolerating soft diet.  Having BM's.  Objective: Vital signs in last 24 hours: Temp:  [97.6 F (36.4 C)-99.1 F (37.3 C)] 99.1 F (37.3 C) (11/19 2018) Pulse Rate:  [91-139] 107 (11/20 0335) Resp:  [16-18] 18 (11/20 0335) BP: (93-127)/(47-64) 93/47 mmHg (11/20 0335) SpO2:  [98 %-100 %] 98 % (11/20 0335) Weight:  [41.2 kg (90 lb 13.3 oz)] 41.2 kg (90 lb 13.3 oz) (11/20 0335) Last BM Date: 09/18/15  Intake/Output from previous day: 11/19 0701 - 11/20 0700 In: 6175.8 [P.O.:1280; I.V.:4395.8; IV Piggyback:500] Out: [id number] [Urine:8150; Stool:1] Intake/Output this shift: Total I/O In: -  Out: 800 [Urine:800]  PE: Gen:  Alert, NAD, pleasant Abd: Soft, NT, mild distension, +BS   Lab Results:   Recent Labs  09/18/15 0450 09/19/15 0520  WBC 10.6* 10.9*  HGB 7.2* 6.8*  HCT 21.0* 20.1*  PLT 301 324   BMET  Recent Labs  09/18/15 0450 09/18/15 1825  NA 141 138  K 2.4* 2.6*  CL 121* 119*  CO2 15* 14*  GLUCOSE 108* 147*  BUN <5* <5*  CREATININE 1.01 0.94  CALCIUM 7.3* 7.1*   PT/INR  Recent Labs  09/18/15 0450 09/19/15 0520  LABPROT 16.3* 16.6*  INR 1.30 1.33   CMP     Component Value Date/Time   NA 138 09/18/2015 1825   K 2.6* 09/18/2015 1825   CL 119* 09/18/2015 1825   CO2 14* 09/18/2015 1825   GLUCOSE 147* 09/18/2015 1825   BUN <5* 09/18/2015 1825   CREATININE 0.94 09/18/2015 1825   CREATININE 1.10 04/19/2015 1450   CALCIUM 7.1* 09/18/2015 1825   PROT 5.0* 09/17/2015 0535   ALBUMIN 2.2* 09/18/2015 1825   AST 34 09/17/2015 0535   ALT 23 09/17/2015 0535   ALKPHOS 82 09/17/2015 0535   BILITOT 0.5 09/17/2015 0535   GFRNONAA >60 09/18/2015 1825   GFRNONAA 76 04/19/2015 1450   GFRAA >60 09/18/2015 1825   GFRAA 88 04/19/2015 1450   Lipase  No results found for: LIPASE     Studies/Results: No results found.  Anti-infectives: Anti-infectives    Start     Dose/Rate Route Frequency Ordered Stop   09/11/15 0000  Ampicillin-Sulbactam (UNASYN) 3 g in sodium chloride 0.9 % 100 mL IVPB  Status:  Discontinued     3 g 100 mL/hr over 60 Minutes Intravenous Every 8 hours 09/10/15 2347 09/16/15 1555       Assessment/Plan SBO - improving, on soft diet, okay to d/c home when medically stable, will sign off MMP - per primary    LOS: 9 days    Nat Christen 09/19/2015, 7:48 AM Pager: [phone]

## 2015-09-19 NOTE — Progress Notes (Addendum)
Subjective: Patient was seen and examined at bedside this morning. He was sitting up in the bed watching TV.reported feeling well and not having any complaints at present. Denied having any CP or SOB. He seemed calm today and was answering questions appropriately. He is tolerating PO intake well.   Objective: Vital signs in last 24 hours: Filed Vitals:   09/18/15 1300 09/18/15 2018 09/18/15 2354 09/19/15 0335  BP: 118/61 116/55 127/64 93/47  Pulse: 139 121 93 107  Temp: 98.2 F (36.8 C) 99.1 F (37.3 C)    TempSrc: Oral Oral    Resp: '18 16  18  ' Height:      Weight:    90 lb 13.3 oz (41.2 kg)  SpO2: 98% 100%  98%   Weight change: -1 lb 15.8 oz (-0.9 kg)  Intake/Output Summary (Last 24 hours) at 09/19/15 1030 Last data filed at 09/19/15 0930  Gross per 24 hour  Intake 6145.83 ml  Output   8401 ml  Net -2255.17 ml   Physical Exam  Constitutional: He is oriented to person, place, and time. No distress.  Cachectic   Cardiovascular: Normal rate, regular rhythm and intact distal pulses.  Exam reveals no gallop and no friction rub.   No murmur heard. Pulmonary/Chest: Effort normal and breath sounds normal. No respiratory distress. He has no wheezes. He has no rales.  Abdominal: Soft. He exhibits no distension. There is no tenderness. There is no rebound and no guarding.  Hypoactive bowel sounds  Neurological: He is alert and oriented to person, place, and time.  Skin: Skin is warm and dry.   Lab Results: Basic Metabolic Panel:  Recent Labs Lab 09/18/15 0450 09/18/15 1825 09/19/15 0520  NA 141 138  --   K 2.4* 2.6*  --   CL 121* 119*  --   CO2 15* 14*  --   GLUCOSE 108* 147*  --   BUN <5* <5*  --   CREATININE 1.01 0.94  --   CALCIUM 7.3* 7.1*  --   MG 1.7  --  2.2  PHOS 2.2* 1.3*  --    Liver Function Tests:  Recent Labs Lab 09/16/15 1523 09/17/15 0535 09/18/15 0450 09/18/15 1825  AST 40 34  --   --   ALT 24 23  --   --   ALKPHOS 86 82  --   --   BILITOT  0.8 0.5  --   --   PROT 5.2* 5.0*  --   --   ALBUMIN 2.4* 2.2* 2.2* 2.2*   CBC:  Recent Labs Lab 09/13/15 0347  09/18/15 0450 09/19/15 0520  WBC 14.4*  < > 10.6* 10.9*  NEUTROABS 11.5*  --  7.8*  --   HGB 8.2*  < > 7.2* 6.8*  HCT 24.6*  < > 21.0* 20.1*  MCV 85.1  < > 85.7 86.3  PLT 217  < > 301 324  < > = values in this interval not displayed. Coagulation:  Recent Labs Lab 09/16/15 1523 09/17/15 0535 09/18/15 0450 09/19/15 0520  LABPROT 16.6* 16.7* 16.3* 16.6*  INR 1.33 1.34 1.30 1.33   Urine Drug Screen: Drugs of Abuse     Component Value Date/Time   LABOPIA * 06/23/2009 1831    POSITIVE (NOTE) Result repeated and verified. Sent for confirmatory testing   COCAINSCRNUR NEGATIVE 06/23/2009 1831   LABBENZ NEGATIVE 06/23/2009 1831   AMPHETMU NEGATIVE 06/23/2009 1831    Micro Results: Recent Results (from the past 240 hour(s))  MRSA PCR Screening     Status: None   Collection Time: 09/10/15  6:23 PM  Result Value Ref Range Status   MRSA by PCR NEGATIVE NEGATIVE Final    Comment:        The GeneXpert MRSA Assay (FDA approved for NASAL specimens only), is one component of a comprehensive MRSA colonization surveillance program. It is not intended to diagnose MRSA infection nor to guide or monitor treatment for MRSA infections.   Culture, blood (routine x 2)     Status: None   Collection Time: 09/10/15 11:25 PM  Result Value Ref Range Status   Specimen Description BLOOD LEFT ANTECUBITAL  Final   Special Requests BOTTLES DRAWN AEROBIC AND ANAEROBIC 10CC   Final   Culture NO GROWTH 5 DAYS  Final   Report Status 09/15/2015 FINAL  Final  Culture, blood (routine x 2)     Status: None   Collection Time: 09/10/15 11:30 PM  Result Value Ref Range Status   Specimen Description BLOOD RIGHT ARM  Final   Special Requests BOTTLES DRAWN AEROBIC AND ANAEROBIC 5CC  Final   Culture NO GROWTH 5 DAYS  Final   Report Status 09/16/2015 FINAL  Final   Studies/Results: No  results found. Medications: I have reviewed the patient's current medications. Scheduled Meds: . sodium chloride   Intravenous Once  . antiseptic oral rinse  7 mL Mouth Rinse q12n4p  . chlorhexidine  15 mL Mouth Rinse BID  . enoxaparin (LOVENOX) injection  40 mg Subcutaneous Q24H  . feeding supplement (ENSURE ENLIVE)  237 mL Oral TID BM  . insulin aspart  0-9 Units Subcutaneous 4 times per day  . pantoprazole (PROTONIX) IV  40 mg Intravenous Q24H  . potassium chloride  10 mEq Intravenous Q1 Hr x 6   Continuous Infusions: . dextrose 125 mL/hr at 09/18/15 1842  .  sodium bicarbonate  infusion 1000 mL 100 mL/hr at 09/18/15 2252   PRN Meds:.acetaminophen, oxyCODONE, sodium chloride Assessment/Plan: Active Problems:   Human immunodeficiency virus (HIV) disease (HCC)   Alcohol abuse   Status post above knee amputation ([place])   Long term current use of anticoagulant   Avascular necrosis of femoral head (HCC)   Septic shock (HCC)   Gram-negative bacteremia (HCC)   Abdominal pain   Pneumonia due to Klebsiella pneumoniae (HCC)   Pressure ulcer   Protein-calorie malnutrition, severe   SBO (small bowel obstruction) (HCC)   Hyperosmolality and/or hypernatremia   Hypomagnesemia   Hypophosphatemia  Septic shock 2/2 to Klebsiella pneumoniae Bacteremia likely from GI (aspiration) vs lung: Resolved.  Was on pressors at [facility]. Imaging shows bilateral pneumonia has improved since 11/8. Afebrile currently. Blood culture showing no growth in 5 days. Satting 98-100% on RA and BP stable. He has been treated with antibiotics for 10 days, initially started at the other hospital on admission there on November 7. Unasyn was discontinued on 11/17.   Partial SBO: Resolved. Abdominal X-ray showing partial SBO, improved since 11/8. Patient was previously on NGT which he pulled out a few nights ago. He was previously given an enema and abdominal exam improved after that- no abdominal tenderness or  distention. Today hypoactive BS were appreciated. He is tolerating PO intake (soft diet) well and having regular BMs.  - Hydromorphone 1 mg q4h prn - Soft (dysphagia 3) diet   Hematemesis: Unclear source of upper GI bleed. Hgb 8.9 on 11/15, with baseline over 13. Likely in the setting of elevated INR during previous  hospitalization. INR 1.33 today. However, Hgb dropped to 6.8 from 7.2 yesterday. Low hgb could partially be due to hemodilution because patient has been receiving a lot of fluids to correct his electrolyte abnormalities.  - Transfuse 2u PRBCs - Protonix IV 40 mg - CBC in am  GB dysfunction: cholestasis, EF 27% per HIDA.  - GI on board, appreciate recs  High INR without warfarin - with History of Unprovoked DVT:  Patient had consistently been supratherapeutic at anti-coag visits and presented to 10 at [facility]. We held Coumadin for this reason. High INR has resolved now.    - Lovenox per pharmacy  - F/u daily cbc and INR  Acute on Chronic Kidney Disease - likely 2/2 to ATN: Baseline creatinine of ~1.2. Was in 7's at outside hospital, was 1.01 yesterday.  - continue to monitor.  - F/u daily BMP  Non gap Metabolic Acidosis: AG is resolved as no longer has lactic acidosis but persistently has low bicarb (15 yesterday).  - appreciate nephro recs. Continue IV bicarb.  Hypokalemia and hypoMag - K 2.6 yesterday. AM labs from today still pending. Mag is normal.  -IV KCl 10 meq q1 x6 + replete Mag as needed  Hypophosphatemia - Phosphorous improved to 2.2 yesterday.  - check labs and replete as needed  Hypernatremia - likely 2/2 to free water deficit Received Normal Saline extensively before transferring to [place]. Na improved to 141 yesterday. -Discontinue D5  High urine output: Dose of DDAVP given on 11/16 for possible diabetes insipidus. Urine output 8.1 L in the past 24 hrs.  -Discontinue D5 -Appreciate nephrology recs   -Giving another dose of DDAVP today (3 mcg)  HIV: CD4  decreased from 700 to 219 in a matter of 2 weeks, likely in the setting of acute illness. Viral load <20 in 07/2015.    -Restart anti-retroviral regimen when PO intake improves/ patient more cooperative with oral meds  -F/u HLA B-57 (01)  Elevated AST: To 62. Likely downtrending from previous septic shock, but could represent underlying liver dysfunction driving up his INR. AST normal now.     Stage II pressure ulcer -Foam dressing as recommended by wound care   Avascular Necrosis of Hip and Phantom Limb Pain: Pain management per above.   DVT Prophylaxis: SCDs Code Status: Full Admit to: Step down  Dispo: Disposition is deferred at this time, awaiting improvement of current medical problems.  Anticipated discharge in approximately 2-3 day(s).   The patient does have a current PCP Iline Oven, MD) and does need an [facility] hospital follow-up appointment after discharge.  The patient does not have transportation limitations that hinder transportation to clinic appointments.  .Services Needed at time of discharge: Y = Yes, Blank = No PT:   OT:   RN:   Equipment:   Other:     LOS: 9 days   Shela Leff, MD 09/19/2015, 10:30 AM

## 2015-09-19 NOTE — Progress Notes (Signed)
[facility] ROUNDING NOTE   Subjective:   Interval History:  No complaints today appears stable at this time  Sitting out of bed   Objective:  Vital signs in last 24 hours:  Temp:  [98.2 F (36.8 C)-99.1 F (37.3 C)] 99.1 F (37.3 C) (11/19 2018) Pulse Rate:  [93-139] 107 (11/20 0335) Resp:  [16-18] 18 (11/20 0335) BP: (93-127)/(47-64) 93/47 mmHg (11/20 0335) SpO2:  [98 %-100 %] 98 % (11/20 0335) Weight:  [41.2 kg (90 lb 13.3 oz)] 41.2 kg (90 lb 13.3 oz) (11/20 0335)  Weight change: -0.9 kg (-1 lb 15.8 oz) Filed Weights   09/17/15 1309 09/18/15 0300 09/19/15 0335  Weight: 42.1 kg (92 lb 13 oz) 41.4 kg (91 lb 4.3 oz) 41.2 kg (90 lb 13.3 oz)    Intake/Output: I/O last 3 completed shifts: In: 8793.3 [P.O.:1280; I.V.:7013.3; IV Piggyback:500] Out: [id number] [Urine:12550; Stool:1]   Intake/Output this shift:  Total I/O In: 120 [P.O.:120] Out: 1250 [Urine:1250]  General: Very thin, cachectic HEENT: mucous membranes dry Neck: There is no JVD Heart: Regular rate and rhythm Lungs: Clear to auscultation bilaterally Abdomen: Flat non tender Extremities: No edema Neuro: Alert and nonfocal- some difficulty in understanding his speech  Basic Metabolic Panel:  Recent Labs Lab 09/15/15 0640 09/15/15 1900 09/16/15 1523 09/17/15 0535 09/18/15 0450 09/18/15 1825 09/19/15 0520  NA 146* 144 144 142 141 138  --   K 3.1* 3.3* 3.0* 2.6* 2.4* 2.6*  --   CL 127* 126* 127* 122* 121* 119*  --   CO2 13* 13* 11* 14* 15* 14*  --   GLUCOSE 135* 129* 97 95 108* 147*  --   BUN 5* 5* 6 6 <5* <5*  --   CREATININE 1.11 1.06 1.11 1.21 1.01 0.94  --   CALCIUM 7.2* 7.0* 7.4* 7.3* 7.3* 7.1*  --   MG 1.8  --  1.4* 1.4* 1.7  --  2.2  PHOS 1.9*  --  2.1* 1.6* 2.2* 1.3*  --     Liver Function Tests:  Recent Labs Lab 09/14/15 0600 09/15/15 0640 09/16/15 1523 09/17/15 0535 09/18/15 0450 09/18/15 1825  AST 37 28 40 34  --   --   ALT 22 20 24 23   --   --   ALKPHOS 72 77  86 82  --   --   BILITOT 0.9 0.9 0.8 0.5  --   --   PROT 4.5* 4.7* 5.2* 5.0*  --   --   ALBUMIN 2.1*  2.0* 2.1* 2.4* 2.2* 2.2* 2.2*   No results for input(s): LIPASE, AMYLASE in the last 168 hours. No results for input(s): AMMONIA in the last 168 hours.  CBC:  Recent Labs Lab 09/12/15 1729 09/13/15 0347 09/14/15 0600 09/16/15 1523 09/18/15 0450 09/19/15 0520  WBC 15.6* 14.4* 16.9* 16.3* 10.6* 10.9*  NEUTROABS 12.5* 11.5*  --   --  7.8*  --   HGB 8.6* 8.2* 8.9* 7.7* 7.2* 6.8*  HCT 25.4* 24.6* 26.4* 23.3* 21.0* 20.1*  MCV 84.1 85.1 84.9 86.0 85.7 86.3  PLT 207 217 228 255 301 324    Cardiac Enzymes: No results for input(s): CKTOTAL, CKMB, CKMBINDEX, TROPONINI in the last 168 hours.  BNP: Invalid input(s): POCBNP  CBG:  Recent Labs Lab 09/18/15 0611 09/18/15 1131 09/18/15 1632 09/18/15 2322 09/19/15 0641  GLUCAP 110* 162* 114* 191* 95    Microbiology: Results for orders placed or performed during the hospital encounter of 09/10/15  MRSA PCR Screening  Status: None   Collection Time: 09/10/15  6:23 PM  Result Value Ref Range Status   MRSA by PCR NEGATIVE NEGATIVE Final    Comment:        The GeneXpert MRSA Assay (FDA approved for NASAL specimens only), is one component of a comprehensive MRSA colonization surveillance program. It is not intended to diagnose MRSA infection nor to guide or monitor treatment for MRSA infections.   Culture, blood (routine x 2)     Status: None   Collection Time: 09/10/15 11:25 PM  Result Value Ref Range Status   Specimen Description BLOOD LEFT ANTECUBITAL  Final   Special Requests BOTTLES DRAWN AEROBIC AND ANAEROBIC 10CC   Final   Culture NO GROWTH 5 DAYS  Final   Report Status 09/15/2015 FINAL  Final  Culture, blood (routine x 2)     Status: None   Collection Time: 09/10/15 11:30 PM  Result Value Ref Range Status   Specimen Description BLOOD RIGHT ARM  Final   Special Requests BOTTLES DRAWN AEROBIC AND ANAEROBIC  5CC  Final   Culture NO GROWTH 5 DAYS  Final   Report Status 09/16/2015 FINAL  Final    Coagulation Studies:  Recent Labs  09/16/15 1523 09/17/15 0535 09/18/15 0450 09/19/15 0520  LABPROT 16.6* 16.7* 16.3* 16.6*  INR 1.33 1.34 1.30 1.33    Urinalysis: No results for input(s): COLORURINE, LABSPEC, PHURINE, GLUCOSEU, HGBUR, BILIRUBINUR, KETONESUR, PROTEINUR, UROBILINOGEN, NITRITE, LEUKOCYTESUR in the last 72 hours.  Invalid input(s): APPERANCEUR    Imaging: No results found.   Medications:   .  sodium bicarbonate  infusion 1000 mL 100 mL/hr at 09/19/15 1044   . sodium chloride   Intravenous Once  . antiseptic oral rinse  7 mL Mouth Rinse q12n4p  . chlorhexidine  15 mL Mouth Rinse BID  . enoxaparin (LOVENOX) injection  40 mg Subcutaneous Q24H  . feeding supplement (ENSURE ENLIVE)  237 mL Oral TID BM  . insulin aspart  0-9 Units Subcutaneous 4 times per day  . pantoprazole (PROTONIX) IV  40 mg Intravenous Q24H  . potassium chloride  10 mEq Intravenous Q1 Hr x 6   acetaminophen, oxyCODONE, sodium chloride  Assessment/ Plan:  1.Renal- acute renal failure likely due to previous sepsis and likely volume depletion. Renal function is now normal and Is making good urine.  2. Metabolic acidosis - is non-gap. This appears secondary to a Renal tubular acidosis IV or oral bicarbonate can be given ( most likely a proximal defect )  3. Hypernatremia and hyperchloremia- likely secondary to a lot of normal saline given at the other hospital. Improved  4. Hypokalemia- agree with repletion Will need Again consistent with RTA Distal vs proximal ( appears clinically like a proximal injury ) Need to follow and replete K  5. Anemia - significant in nature. Possibly some due to HIV and recent significant hospitalization as well as acute kidney injury. Supportive care for now 6. High urine output -- DDAVP given for possible diabetes insipidus -- this may be acceptable  considering normal renal function and high output  7. Hypophosphatemia  -- needs to be repleted   LOS: 9 [name] W @TODAY @11 :03 AM

## 2015-09-19 NOTE — Progress Notes (Signed)
Internal Medicine Attending:   I saw and examined the patient. I reviewed the resident's note and I agree with the resident's findings and plan as documented in the resident's note.  Clinical status is stable today. Continues to take minimal oral food and water, despite SBO being essentially resolved. We continue to aggressively replete potassium but he is apparently intracellularly very depleted.

## 2015-09-20 LAB — CBC
HCT: 27.6 % — ABNORMAL LOW (ref 39.0–52.0)
Hemoglobin: 9.6 g/dL — ABNORMAL LOW (ref 13.0–17.0)
MCH: 29.5 pg (ref 26.0–34.0)
MCHC: 34.8 g/dL (ref 30.0–36.0)
MCV: 84.9 fL (ref 78.0–100.0)
Platelets: 285 10*3/uL (ref 150–400)
RBC: 3.25 MIL/uL — ABNORMAL LOW (ref 4.22–5.81)
RDW: 17.4 % — AB (ref 11.5–15.5)
WBC: 11 10*3/uL — ABNORMAL HIGH (ref 4.0–10.5)

## 2015-09-20 LAB — PROTIME-INR
INR: 1.22 (ref 0.00–1.49)
PROTHROMBIN TIME: 15.6 s — AB (ref 11.6–15.2)

## 2015-09-20 LAB — RENAL FUNCTION PANEL
ALBUMIN: 2.5 g/dL — AB (ref 3.5–5.0)
ANION GAP: 6 (ref 5–15)
Albumin: 2.5 g/dL — ABNORMAL LOW (ref 3.5–5.0)
Anion gap: 3 — ABNORMAL LOW (ref 5–15)
CALCIUM: 7.4 mg/dL — AB (ref 8.9–10.3)
CHLORIDE: 121 mmol/L — AB (ref 101–111)
CO2: 14 mmol/L — AB (ref 22–32)
CO2: 14 mmol/L — AB (ref 22–32)
CREATININE: 0.86 mg/dL (ref 0.61–1.24)
CREATININE: 0.84 mg/dL (ref 0.61–1.24)
Calcium: 7.8 mg/dL — ABNORMAL LOW (ref 8.9–10.3)
Chloride: 119 mmol/L — ABNORMAL HIGH (ref 101–111)
GFR calc Af Amer: >60 mL/min (ref >60)
GFR calc Af Amer: >60 mL/min (ref >60)
GFR calc non Af Amer: >60 mL/min (ref >60)
GLUCOSE: 93 mg/dL (ref 65–99)
Glucose, Bld: 118 mg/dL — ABNORMAL HIGH (ref 65–99)
PHOSPHORUS: 3.4 mg/dL (ref 2.5–4.6)
POTASSIUM: 3.7 mmol/L (ref 3.5–5.1)
Phosphorus: 1.8 mg/dL — ABNORMAL LOW (ref 2.5–4.6)
Potassium: 3.2 mmol/L — ABNORMAL LOW (ref 3.5–5.1)
SODIUM: 139 mmol/L (ref 135–145)
Sodium: 138 mmol/L (ref 135–145)

## 2015-09-20 LAB — TYPE AND SCREEN
ABO/RH(D): O POS
Antibody Screen: NEGATIVE
UNIT DIVISION: 0
UNIT DIVISION: 0

## 2015-09-20 LAB — GLUCOSE, CAPILLARY
GLUCOSE-CAPILLARY: 86 mg/dL (ref 65–99)
GLUCOSE-CAPILLARY: 114 mg/dL — AB (ref 65–99)
GLUCOSE-CAPILLARY: 67 mg/dL (ref 65–99)
Glucose-Capillary: 81 mg/dL (ref 65–99)
Glucose-Capillary: 122 mg/dL — ABNORMAL HIGH (ref 65–99)

## 2015-09-20 LAB — MAGNESIUM: MAGNESIUM: 1.6 mg/dL — AB (ref 1.7–2.4)

## 2015-09-20 MED ORDER — JUVEN PO PACK
1.0000 | PACK | Freq: Two times a day (BID) | ORAL | Status: DC
  Administered 2015-09-20: 1 via ORAL
  Filled 2015-09-20 (×2): qty 1

## 2015-09-20 MED ORDER — POTASSIUM PHOSPHATE MONOBASIC 500 MG PO TABS
1500.0000 mg | ORAL_TABLET | Freq: Two times a day (BID) | ORAL | Status: DC
  Administered 2015-09-20: 1500 mg via ORAL
  Filled 2015-09-20 (×2): qty 3

## 2015-09-20 MED ORDER — K PHOS MONO-SOD PHOS DI & MONO 155-852-130 MG PO TABS
750.0000 mg | ORAL_TABLET | Freq: Two times a day (BID) | ORAL | Status: DC
  Administered 2015-09-21 (×2): 750 mg via ORAL
  Filled 2015-09-20 (×5): qty 3

## 2015-09-20 MED ORDER — ADULT MULTIVITAMIN W/MINERALS CH
1.0000 | ORAL_TABLET | Freq: Every day | ORAL | Status: DC
  Administered 2015-09-20 – 2015-09-22 (×3): 1 via ORAL
  Filled 2015-09-20 (×3): qty 1

## 2015-09-20 MED ORDER — POTASSIUM CHLORIDE CRYS ER 20 MEQ PO TBCR
40.0000 meq | EXTENDED_RELEASE_TABLET | Freq: Once | ORAL | Status: AC
  Administered 2015-09-20: 40 meq via ORAL
  Filled 2015-09-20: qty 2

## 2015-09-20 MED ORDER — POTASSIUM CITRATE ER 10 MEQ (1080 MG) PO TBCR
40.0000 meq | EXTENDED_RELEASE_TABLET | Freq: Two times a day (BID) | ORAL | Status: DC
  Administered 2015-09-21 – 2015-09-22 (×3): 40 meq via ORAL
  Filled 2015-09-20 (×5): qty 4

## 2015-09-20 MED ORDER — POTASSIUM CITRATE ER 10 MEQ (1080 MG) PO TBCR
40.0000 meq | EXTENDED_RELEASE_TABLET | Freq: Two times a day (BID) | ORAL | Status: DC
  Filled 2015-09-20: qty 4

## 2015-09-20 MED ORDER — MAGNESIUM SULFATE 2 GM/50ML IV SOLN
2.0000 g | Freq: Once | INTRAVENOUS | Status: AC
  Administered 2015-09-20: 2 g via INTRAVENOUS
  Filled 2015-09-20: qty 50

## 2015-09-20 MED ORDER — DEXTROSE 5 % IV SOLN
3.0000 ug | Freq: Once | INTRAVENOUS | Status: AC
  Administered 2015-09-20: 3 ug via INTRAVENOUS
  Filled 2015-09-20 (×2): qty 0.75

## 2015-09-20 MED ORDER — SODIUM BICARBONATE 650 MG PO TABS
1300.0000 mg | ORAL_TABLET | Freq: Three times a day (TID) | ORAL | Status: DC
  Administered 2015-09-20 – 2015-09-22 (×7): 1300 mg via ORAL
  Filled 2015-09-20 (×7): qty 2

## 2015-09-20 MED ORDER — POTASSIUM PHOSPHATE MONOBASIC 500 MG PO TABS
1500.0000 mg | ORAL_TABLET | Freq: Two times a day (BID) | ORAL | Status: DC
  Filled 2015-09-20: qty 3

## 2015-09-20 MED ORDER — DEXTROSE 5 % IV SOLN
INTRAVENOUS | Status: DC
  Administered 2015-09-20: 09:00:00 via INTRAVENOUS

## 2015-09-20 MED ORDER — POTASSIUM CHLORIDE 20 MEQ PO PACK: 40.0000 meq | PACK | Freq: Once | ORAL | Status: DC

## 2015-09-20 MED ORDER — K PHOS MONO-SOD PHOS DI & MONO 155-852-130 MG PO TABS: 1500.0000 mg | ORAL_TABLET | Freq: Two times a day (BID) | ORAL | Status: DC

## 2015-09-20 MED ORDER — ENSURE ENLIVE PO LIQD
237.0000 mL | Freq: Four times a day (QID) | ORAL | Status: DC
  Administered 2015-09-20 – 2015-09-22 (×7): 237 mL via ORAL

## 2015-09-20 MED ORDER — POTASSIUM CHLORIDE 10 MEQ/50ML IV SOLN
10.0000 meq | INTRAVENOUS | Status: AC
  Administered 2015-09-20 (×4): 10 meq via INTRAVENOUS
  Filled 2015-09-20 (×4): qty 50

## 2015-09-20 MED ORDER — JUVEN PO PACK
1.0000 | PACK | Freq: Two times a day (BID) | ORAL | Status: DC
  Administered 2015-09-21 – 2015-09-22 (×2): 1 via ORAL
  Filled 2015-09-20 (×5): qty 1

## 2015-09-20 NOTE — Care Management Important Message (Signed)
Important Message  Patient Details  Name: [name] MRN: [id number] Date of Birth: January 07, 1961   Medicare Important Message Given:  Yes    Dalexa Gentz P [place] 09/20/2015, 3:31 PM

## 2015-09-20 NOTE — Progress Notes (Signed)
Subjective: Interval History: has complaints needs to pee.  Objective: Vital signs in last 24 hours: Temp:  [98.4 F (36.9 C)-100.2 F (37.9 C)] 98.6 F (37 C) (11/21 [id number]) Pulse Rate:  [92-131] 103 (11/21 0623) Resp:  [18-20] 20 (11/20 2023) BP: (92-122)/(53-66) 95/62 mmHg (11/21 0623) SpO2:  [100 %] 100 % (11/21 [id number]) Weight change:   Intake/Output from previous day: 11/20 0701 - 11/21 0700 In: 5542 [P.O.:1200; I.V.:2455; Blood:617; IV Piggyback:1270] Out: [id number] [Urine:5625] Intake/Output this shift: Total I/O In: 240 [P.O.:240] Out: -   General appearance: alert, cachectic, pale and slowed mentation Resp: diminished breath sounds bilaterally Cardio: S1, S2 normal GI: pos bs, nontender Extremities: s/p amp,no edema  Lab Results:  Recent Labs  09/19/15 0520 09/19/15 2048 09/20/15 0358  WBC 10.9*  --  11.0*  HGB 6.8* 8.8* 9.6*  HCT 20.1* 26.1* 27.6*  PLT 324  --  285   BMET:  Recent Labs  09/19/15 2200 09/20/15 0358  NA 139 139  K 3.3* 3.2*  CL 121* 119*  CO2 14* 14*  GLUCOSE 123* 93  BUN <5* <5*  CREATININE 0.93 0.86  CALCIUM 7.4* 7.4*   No results for input(s): PTH in the last 72 hours. Iron Studies: No results for input(s): IRON, TIBC, TRANSFERRIN, FERRITIN in the last 72 hours.  Studies/Results: No results found.  I have reviewed the patient's current medications.  Assessment/Plan: 1 AKI resolved 2 E'lyte abn.  Not exactly sure what the problems are yet    RTA definitely has, difficult to define type in setting of other issues  Low Mg may be primary issue and suspect severe Malnutrition, acute illness, and chronic ETOH  Low phos not sure this is renal source as severe Malnutrtion and ETOH are issues  Low K with low Mg and low Nutrition with now some repletion this may not be renal.  ??DI  His urine OSM are not compatible with DI incomplete or complete. May have some incomplete DI with post ATN diuresis, and low Mg/K which prohibit  concentrating.   Has no urine studies to document or diagnose problems other than Urine ph which is c/w RTA.  Cannot do studies during repletion. Rec:  Start Urocit K 29mEq bid until K replete then d/c Close F/u Mg and replete Start Kphos tabs 29meq bid and replete. Once K, Mg stable, stop those and wait 72 h and look at urine studies. For DDAVP, would hold and allow SNa to rise and use po dDAVP if needed.  Would restudy U Osm if off DDAVP 72 h   LOS: 10 days   Xinyi Batton L 09/20/2015,9:32 AM

## 2015-09-20 NOTE — PMR Pre-admission (Signed)
PMR Admission Coordinator Pre-Admission Assessment  Patient: [name] is an 54 y.o., male MRN: [id number] DOB: 04-23-1961 Height: 6' (182.9 cm) Weight: 41.776 kg (92 lb 1.6 oz) (bed scale)              Insurance Information HMO: No    PPO:       PCP:       IPA:       80/20:       OTHER:   PRIMARY: Medicare A/B      Policy#:  [id number]      Subscriber: [name] CM Name:        Phone#:       Fax#:   Pre-Cert#:        Employer:  Not employed/Disabled Benefits:  Phone #:       Name: Checked in [place]. Date: 07/30/98     Deduct: $1288      Out of Pocket Max: none      Life Max: unlimited CIR: 100%      SNF: 100 days Outpatient: 80%     Co-Pay: 20% Home Health: 100%      Co-Pay: none DME: 80%     Co-Pay: 20% Providers: patient's choice  SECONDARY:  Medicaid  access      Policy#: [id number]      Subscriber: [name] CM Name:        Phone#:       Fax#:   Pre-Cert#:        Employer: Disabled/ Not employed Benefits:  Phone #: [phone]     Name: Automated Eff. Date: Eligible 09/21/15 coverage code [id number]     Deduct:        Out of Pocket Max:        Life Max:   CIR:        SNF:   Outpatient:       Co-Pay:   Home Health:        Co-Pay:   DME:       Co-Pay:    Emergency Contact Information Contact Information    Name Relation Home Work [place]   [place]    [phone]     Current Medical History  Patient Admitting Diagnosis:  Debility  History of Present Illness: A 54 y.o. 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 Patient lives with his 29 year old daughter independent with assistive device prior to admission and prosthesis. Pt's daughter is planning on going to work and is in the process of trying to obtain some assistance for her father. One level apt with ramped entrance. Presented in transfer from [facility] 09/10/2015 with dark tarry stools and hematemesis.  INR greater than 10 and reversed. Found to be acidotic, hypernatremic/hypokalemic required intubation. Chest x-ray bilateral pneumonia question aspiration. Placed on broad-spectrum antibiotics. He was transfused. Blood cultures positive for Klebsiella. KUB of the abdomen showed partial small bowel obstruction requiring nasogastric tube. Nephrology services follow-up for hypernatremia as well as hypokalemia/acute renal insufficiency with supplement added and latest sodium improved as well as hypokalemia reversed. Hemoglobin remained stable 7.7-9.6. Subcutaneous Lovenox for DVT prophylaxis. Remains on a full liquid diet and advance to mechanical soft nasogastric tube removed. Wound care nurse follow-up for sacral wound with dressing changes as advised. Plan to resume anti-retroviral regimen once by mouth intake improved. Physical therapy evaluation completed and ongoing noting debilitation with recommendations of physical medicine rehabilitation  consult. Patient to be admitted for comprehensive inpatient rehabilitation program.   Past Medical History  Past Medical History  Diagnosis Date  . History of chicken pox   . Hyperlipidemia   . History of DVT of lower extremity     right  . Left hip pain 03/09/2013  . Pneumonia   . Chronic kidney disease   . Protein malnutrition ([place]) 08/2015  . HIV (human immunodeficiency virus infection) ([place])     Family History  family history includes Arthritis in his mother; Kidney disease in his maternal grandfather.  Prior Rehab/Hospitalizations: Had outpatient therapy through [facility] a couple months ago.  Has the patient had major surgery during 100 days prior to admission? No  Current Medications   Current facility-administered medications:  .  0.9 %  sodium chloride infusion, , Intravenous, Once, Tasrif Ahmed, MD .  acetaminophen (TYLENOL) tablet 500 mg, 500 mg, Oral, Q4H PRN, Tasrif Ahmed, MD, 500 mg at 09/21/15 2239 .  antiseptic oral rinse (CPC /  CETYLPYRIDINIUM CHLORIDE 0.05%) solution 7 mL, 7 mL, Mouth Rinse, q12n4p, Campbell Riches, MD, 7 mL at 09/21/15 1643 .  chlorhexidine (PERIDEX) 0.12 % solution 15 mL, 15 mL, Mouth Rinse, BID, Campbell Riches, MD, 15 mL at 09/21/15 0921 .  enoxaparin (LOVENOX) injection 40 mg, 40 mg, Subcutaneous, Q24H, Annia Belt, MD, 40 mg at 09/17/15 0910 .  feeding supplement (ENSURE ENLIVE) (ENSURE ENLIVE) liquid 237 mL, 237 mL, Oral, QID, Reanne J Barbato, RD, 237 mL at 09/22/15 1031 .  insulin aspart (novoLOG) injection 0-9 Units, 0-9 Units, Subcutaneous, 4 times per day, Eudelia Bunch, [facility], 1 Units at 09/21/15 1740 .  magnesium chloride (SLOW-MAG) 64 MG SR tablet 64 mg, 1 tablet, Oral, Daily, Tasrif Ahmed, MD .  multivitamin with minerals tablet 1 tablet, 1 tablet, Oral, Daily, Reanne J Barbato, RD, 1 tablet at 09/22/15 1028 .  nutrition supplement (JUVEN) (JUVEN) powder packet 1 packet, 1 packet, Oral, BID WC, Annia Belt, MD, 1 packet at 09/22/15 0805 .  oxyCODONE (Oxy IR/ROXICODONE) immediate release tablet 5 mg, 5 mg, Oral, Q6H PRN, Shela Leff, MD, 5 mg at 09/22/15 0808 .  pantoprazole (PROTONIX) EC tablet 40 mg, 40 mg, Oral, Daily, Annia Belt, MD, 40 mg at 09/22/15 1028 .  phosphorus (K PHOS NEUTRAL) tablet 750 mg, 750 mg, Oral, BID, Annia Belt, MD, 750 mg at 09/21/15 2239 .  potassium chloride SA (K-DUR,KLOR-CON) CR tablet 20 mEq, 20 mEq, Oral, BID, Shela Leff, MD, 20 mEq at 09/22/15 1033 .  potassium citrate (UROCIT-K) SR tablet 40 mEq, 40 mEq, Oral, BID WC, Annia Belt, MD, 40 mEq at 09/22/15 0802 .  sodium bicarbonate tablet 1,300 mg, 1,300 mg, Oral, TID, Shela Leff, MD, 1,300 mg at 09/22/15 1028 .  sodium chloride 0.9 % injection 10-40 mL, 10-40 mL, Intracatheter, PRN, Campbell Riches, MD, 30 mL at 09/22/15 0501  Patients Current Diet: DIET DYS 3 Room service appropriate?: Yes; Fluid consistency:: Thin  Precautions /  Restrictions Precautions Precautions: Fall Precaution Comments: watch HR Restrictions Weight Bearing Restrictions: No   Has the patient had 2 or more falls or a fall with injury in the past year?No  Prior Activity Level Community (5-7x/wk): Went out daily up until he got sick 2 weeks ago.  Home Assistive Devices / Equipment Home Assistive Devices/Equipment: Crutches Home Equipment: Environmental consultant - 2 wheels, Wheelchair - manual, Bedside commode  Prior Device Use: Indicate devices/aids used by the patient prior to current  illness, exacerbation or injury? Motorized wheelchair or scooter, Walker and straight cane and crutches.  Prior Functional Level Prior Function Level of Independence: Independent with assistive device(s)  Self Care: Did the patient need help bathing, dressing, using the toilet or eating?  Independent.  Patient says his daughter does the cooking.  Indoor Mobility: Did the patient need assistance with walking from room to room (with or without device)? Independent  Stairs: Did the patient need assistance with internal or external stairs (with or without device)? Independent  Functional Cognition: Did the patient need help planning regular tasks such as shopping or remembering to take medications? Independent  Current Functional Level Cognition  Overall Cognitive Status: Within Functional Limits for tasks assessed Orientation Level: Disoriented to time    Extremity Assessment (includes Sensation/Coordination)  Upper Extremity Assessment: Generalized weakness (R AKA)  Lower Extremity Assessment: Generalized weakness    ADLs       Mobility  Overal bed mobility: Needs Assistance Bed Mobility: Supine to Sit, Sit to Supine Supine to sit: Min assist General bed mobility comments: increased time, pt with difficulty processing how to sit up, cues for use of rail and to scoot to edge of bed    Transfers  Overall transfer level: Needs assistance Equipment used: Rolling  walker (2 wheeled) Transfers: Sit to/from Stand Sit to Stand: Mod assist Stand pivot transfers: Min assist Squat pivot transfers: Mod assist General transfer comment: lifting assist from bed, lowering assist for safety to chair with cues for hand placement    Ambulation / Gait / Stairs / Wheelchair Mobility  Ambulation/Gait Ambulation/Gait assistance: Min assist Ambulation Distance (Feet): 50 Feet (x 2 with seated rest in hall) Assistive device: Rolling walker (2 wheeled) Gait Pattern/deviations: Step-to pattern General Gait Details: slow, wearing shoe, limited due to HR up to 147 Gait velocity: controlled Gait velocity interpretation: Below normal speed for age/gender    Posture / Balance Balance Overall balance assessment: Needs assistance Sitting-balance support: Single extremity supported Sitting balance-Leahy Scale: Good Standing balance support: Bilateral upper extremity supported Standing balance-Leahy Scale: Poor Standing balance comment: UE support on walker    Special needs/care consideration BiPAP/CPAP No CPM No Continuous Drip IV No Dialysis No         Life Vest No Oxygen No Special Bed No Trach Size No Wound Vac (area) No      Skin Pressure sore on coccyx                            Bowel mgmt: Had BM 09/20/15 Bladder mgmt: urinary catheter in place Diabetic mgmt No    Previous Home Environment Living Arrangements: Children (daughter) Available Help at Discharge: Family, Available 24 hours/day Type of Home: House Home Layout: One level Home Access: Stairs to enter, Ramped entrance Technical brewer of Steps: did not state Home Care Services: No Additional Comments: prosthesis  Discharge Living Setting Plans for Discharge Living Setting: Lives with (comment), Apartment (Dtr lives with patient.) Type of Home at Discharge: Apartment (Handicap accessible.) Discharge Home Layout: One level Discharge Home Access: Ramped entrance Does the patient have  any problems obtaining your medications?: No  Social/Family/Support Systems Patient Roles: Parent (Has a daughter.) Contact Information: Verner Chol - daughter Anticipated Caregiver: [name] Anticipated Caregiver's Contact Information: [name] - dtr [phone] Ability/Limitations of Caregiver: Dtr at home with patient.  Looking for a job, but unable to find a job yet. Caregiver Availability: 24/7 Discharge Plan Discussed with Primary Caregiver: Yes  Is Caregiver In Agreement with Plan?: Yes Does Caregiver/Family have Issues with Lodging/Transportation while Pt is in Rehab?: No  Goals/Additional Needs Patient/Family Goal for Rehab: PT/OT/St mod I goals Expected length of stay: 7-10 days Cultural Considerations: None Dietary Needs: Dys 3, thin liquids Equipment Needs: TBD Pt/Family Agrees to Admission and willing to participate: Yes Program Orientation Provided & Reviewed with Pt/Caregiver Including Roles  & Responsibilities: Yes  Decrease burden of Care through IP rehab admission: N/A  Possible need for SNF placement upon discharge: Not planned  Patient Condition: This patient's medical and functional status has changed since the consult dated: 09/17/15 in which the Rehabilitation Physician determined and documented that the patient's condition is appropriate for intensive rehabilitative care in an inpatient rehabilitation facility. See "History of Present Illness" (above) for medical update. Functional changes are:  Currently requiring min/mod assist for transfers and ambulated 100 ft RW. Patient's medical and functional status update has been discussed with the Rehabilitation physician and patient remains appropriate for inpatient rehabilitation. Will admit to inpatient rehab today.  Preadmission Screen Completed By:  Retta Diones, 09/22/2015 10:35 AM ______________________________________________________________________   Discussed status with Dr. Posey Pronto on 09/22/15 at 62 and  received telephone approval for admission today.  Admission Coordinator:  Retta Diones, time1035/Date11/23/2016

## 2015-09-20 NOTE — H&P (Signed)
Physical Medicine and Rehabilitation Admission H&P    Chief complaint: Weakness  HPI: [name] is a 54 y.o. 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 Patient lives with his 4 year old daughter independent with assistive device prior to admission and prosthesis. Pt's daughter is planning on going to work and is in the process of trying to obtain some assistance for her father. One level apt with ramped entrance. Presented in transfer from [facility] 09/10/2015 with dark tarry stools and hematemesis. INR greater than 10 and reversed. Found to be acidotic, hypernatremic/hypokalemic required intubation. Chest x-ray bilateral pneumonia question aspiration. Placed on broad-spectrum antibiotics. He was transfused. Blood cultures positive for Klebsiella. KUB of the abdomen showed partial small bowel obstruction requiring nasogastric tube. Nephrology services follow-up for hypernatremia as well as hypokalemia/acute renal insufficiency with supplement added and latest sodium improved as well as hypokalemia reversed. Hemoglobin remained stable 7.7-9.6. Subcutaneous Lovenox for DVT prophylaxis and later discontinued with Eliquis initiated for history of DVT . Remains on a full liquid diet and advance to mechanical soft nasogastric tube removed. Wound care nurse follow-up for sacral wound with dressing changes as advised. Plan to resume anti-retroviral regimen once by mouth intake improved. Physical therapy evaluation completed and ongoing noting debilitation with recommendations of physical medicine rehabilitation consult. Patient was admitted for comprehensive rehabilitation program  ROS Review of Systems  Constitutional: Positive for weight loss. Negative for fever and chills.  HENT: Positive for tinnitus.  Eyes: Negative for blurred vision and double vision.  Respiratory: Negative for cough and shortness of breath.  Cardiovascular: Negative  for chest pain and palpitations.  Gastrointestinal: Negative for abdominal pain.  Genitourinary: Negative for dysuria and hematuria.  Musculoskeletal: Positive for myalgias and joint pain.  Skin: Negative for rash.  Neurological: Positive for weakness.  All other systems reviewed and are negative   Past Medical History  Diagnosis Date  . History of chicken pox   . Hyperlipidemia   . History of DVT of lower extremity     right  . Left hip pain 03/09/2013  . Pneumonia   . Chronic kidney disease   . Protein malnutrition ([place]) 08/2015  . HIV (human immunodeficiency virus infection) [facility])    Past Surgical History  Procedure Laterality Date  . Appendectomy  1962  . Leg amputation above knee  2010    right leg   Family History  Problem Relation Age of Onset  . Arthritis Mother   . Kidney disease Maternal Grandfather    Social History:  reports that he has been smoking Cigarettes.  He has a 18 pack-year smoking history. He has never used smokeless tobacco. He reports that he does not drink alcohol or use illicit drugs. Allergies:  Allergies  Allergen Reactions  . Dapsone     REACTION: fever  . Sulfamethoxazole-Trimethoprim     REACTION: fever   Medications Prior to Admission  Medication Sig Dispense Refill  . aspirin 81 MG tablet Take 81 mg by mouth daily.      Marland Kitchen ENSURE (ENSURE) Take 1 Can by mouth 3 (three) times daily between meals. 237 mL prn  . fenofibrate (TRICOR) 145 MG tablet Take 1 tablet (145 mg total) by mouth daily. 30 tablet 5  . gabapentin (NEURONTIN) 400 MG capsule TAKE ONE CAPSULE BY MOUTH FOUR TIMES DAILY 120 capsule 5  . morphine (MS CONTIN) 60 MG 12 hr tablet Take 2 tablets (120 mg total) by mouth  every 12 (twelve) hours. 120 tablet 0  . oxyCODONE (OXY IR/ROXICODONE) 5 MG immediate release tablet Take 1 tablet (5 mg total) by mouth every 6 (six) hours as needed for severe pain. 60 tablet 0  . potassium chloride SA (K-DUR,KLOR-CON) 20 MEQ tablet TAKE 1 TABLET  BY MOUTH EVERY DAY 30 tablet 3  . [name] 50 MG tablet TAKE 1 TABLET (50 MG TOTAL) BY MOUTH DAILY. 30 tablet 11  . [name] 300 MG tablet TAKE 1 TABLET (300 MG TOTAL) BY MOUTH DAILY. 30 tablet 11  . warfarin (COUMADIN) 5 MG tablet Take 2.5-5 mg by mouth daily. On Sunday Monday Wednesday Friday patient takes 2.5 mg and on all other days patient takes 5 mg.    . EVOTAZ 300-150 MG per tablet TAKE 1 TABLET BY MOUTH DAILY. SWALLOW WHOLE. DO NOT CRUSH, CUT OR CHEW TABLET. TAKE WITH FOOD. 30 tablet 11    Home: Home Living Family/patient expects to be discharged to:: Private residence Living Arrangements: Children (daughter) Available Help at Discharge: Family, Available 24 hours/day Type of Home: House Home Access: Stairs to enter, Ramped entrance Technical brewer of Steps: did not state Home Layout: One Hannah: Environmental consultant - 2 wheels, Wheelchair - manual, Bedside commode Additional Comments: prosthesis   Functional History: Prior Function Level of Independence: Independent with assistive device(s)  Functional Status:  Mobility: Bed Mobility Overal bed mobility: Modified Independent Bed Mobility: Supine to Sit, Sit to Supine Supine to sit: Mod assist General bed mobility comments: already in the chair Transfers Overall transfer level: Needs assistance Equipment used: Rolling walker (2 wheeled), 2 person hand held assist Transfers: Sit to/from Stand Sit to Stand: Min assist, Mod assist (from lower surfaces, min higher surfaces) Stand pivot transfers: Min assist Squat pivot transfers: Mod assist General transfer comment: reminded hand placement then pt did independently Ambulation/Gait Ambulation/Gait assistance: Min assist Ambulation Distance (Feet): 100 Feet Assistive device: Rolling walker (2 wheeled), 1 person hand held assist (second person for equpment and safety) Gait Pattern/deviations: Step-to pattern General Gait Details: slow and steady swing forward. Gait  velocity: controlled Gait velocity interpretation: Below normal speed for age/gender    ADL:    Cognition: Cognition Overall Cognitive Status: Within Functional Limits for tasks assessed Orientation Level: Oriented to person, Oriented to place, Oriented to situation, Disoriented to time Cognition Arousal/Alertness: Awake/alert Behavior During Therapy: WFL for tasks assessed/performed Overall Cognitive Status: Within Functional Limits for tasks assessed  Physical Exam: Blood pressure 108/58, pulse 96, temperature 98.7 F (37.1 C), temperature source Oral, resp. rate 18, height 6' (1.829 m), weight 41.2 kg (90 lb 13.3 oz), SpO2 100 %. Physical Exam Constitutional: No distress.  54 year old cachectic African-American male appearing older than stated age  HENT:  Head: Normocephalic and atraumatic.  Eyes: Conjunctivae and EOM are normal.  Neck: Normal range of motion. No thyromegaly present.  Cardiovascular: Normal rate and regular rhythm.  Respiratory: Effort normal and breath sounds normal.  GI: Soft. Bowel sounds are normal. He exhibits no distension.  Musculoskeletal: He exhibits no edema or tenderness.  Right AKA  Neurological: He is alert and oriented to person and place.  Sensation intact to light touch throughout Motor: B/l UE 4/5 proximal to distal LLE: 4+/5 proximal to distal Right hip flexion 4/5.  Skin: Skin is warm and dry.  AKA site is healed. Sacral ulcer not examined  Psychiatric: Normal behavior.  Cognition and memory are impaired  Results for orders placed or performed during the hospital encounter of 09/10/15 (from  the past 48 hour(s))  Glucose, capillary     Status: Abnormal   Collection Time: 09/18/15  4:32 PM  Result Value Ref Range   Glucose-Capillary 114 (H) 65 - 99 mg/dL   Comment 1 Notify RN   Renal function panel     Status: Abnormal   Collection Time: 09/18/15  6:25 PM  Result Value Ref Range   Sodium 138 135 - 145 mmol/L   Potassium 2.6  (LL) 3.5 - 5.1 mmol/L    Comment: CRITICAL RESULT CALLED TO, READ BACK BY AND VERIFIED WITH: CIJANI,D RN 09/18/15 1936 WOOTEN,K    Chloride 119 (H) 101 - 111 mmol/L   CO2 14 (L) 22 - 32 mmol/L   Glucose, Bld 147 (H) 65 - 99 mg/dL   BUN <5 (L) 6 - 20 mg/dL   Creatinine, Ser 0.94 0.61 - 1.24 mg/dL   Calcium 7.1 (L) 8.9 - 10.3 mg/dL   Phosphorus 1.3 (L) 2.5 - 4.6 mg/dL   Albumin 2.2 (L) 3.5 - 5.0 g/dL   GFR calc non Af Amer >60 >60 mL/min   GFR calc Af Amer >60 >60 mL/min    Comment: (NOTE) The eGFR has been calculated using the CKD EPI equation. This calculation has not been validated in all clinical situations. eGFR's persistently <60 mL/min signify possible Chronic Kidney Disease.    Anion gap 5 5 - 15  Glucose, capillary     Status: Abnormal   Collection Time: 09/18/15 11:22 PM  Result Value Ref Range   Glucose-Capillary 191 (H) 65 - 99 mg/dL  Protime-INR     Status: Abnormal   Collection Time: 09/19/15  5:20 AM  Result Value Ref Range   Prothrombin Time 16.6 (H) 11.6 - 15.2 seconds   INR 1.33 0.00 - 1.49  CBC     Status: Abnormal   Collection Time: 09/19/15  5:20 AM  Result Value Ref Range   WBC 10.9 (H) 4.0 - 10.5 K/uL   RBC 2.33 (L) 4.22 - 5.81 MIL/uL   Hemoglobin 6.8 (LL) 13.0 - 17.0 g/dL    Comment: REPEATED TO VERIFY CONSISTENT WITH PREVIOUS RESULT CRITICAL RESULT CALLED TO, READ BACK BY AND VERIFIED WITH: Boris Lown AT 1194 09/19/15. K.PAXTON    HCT 20.1 (L) 39.0 - 52.0 %   MCV 86.3 78.0 - 100.0 fL   MCH 29.2 26.0 - 34.0 pg   MCHC 33.8 30.0 - 36.0 g/dL   RDW 17.9 (H) 11.5 - 15.5 %   Platelets 324 150 - 400 K/uL  Magnesium     Status: None   Collection Time: 09/19/15  5:20 AM  Result Value Ref Range   Magnesium 2.2 1.7 - 2.4 mg/dL  Glucose, capillary     Status: None   Collection Time: 09/19/15  6:41 AM  Result Value Ref Range   Glucose-Capillary 95 65 - 99 mg/dL  Type and screen Winn     Status: None   Collection Time:  09/19/15  9:30 AM  Result Value Ref Range   ABO/RH(D) O POS    Antibody Screen NEG    Sample Expiration 09/22/2015    Unit Number [id number]    Blood Component Type RED CELLS,LR    Unit division 00    Status of Unit ISSUED,FINAL    Transfusion Status OK TO TRANSFUSE    Crossmatch Result Compatible    Unit Number [id number]    Blood Component Type RBC LR PHER1    Unit division 00  Status of Unit ISSUED,FINAL    Transfusion Status OK TO TRANSFUSE    Crossmatch Result Compatible   Prepare RBC     Status: None   Collection Time: 09/19/15  9:30 AM  Result Value Ref Range   Order Confirmation ORDER PROCESSED BY BLOOD BANK   ABO/Rh     Status: None   Collection Time: 09/19/15  9:30 AM  Result Value Ref Range   ABO/RH(D) O POS   Renal function panel     Status: Abnormal   Collection Time: 09/19/15 11:30 AM  Result Value Ref Range   Sodium 139 135 - 145 mmol/L   Potassium 3.1 (L) 3.5 - 5.1 mmol/L   Chloride 121 (H) 101 - 111 mmol/L   CO2 14 (L) 22 - 32 mmol/L   Glucose, Bld 114 (H) 65 - 99 mg/dL   BUN <5 (L) 6 - 20 mg/dL   Creatinine, Ser 1.01 0.61 - 1.24 mg/dL   Calcium 7.5 (L) 8.9 - 10.3 mg/dL   Phosphorus 1.1 (L) 2.5 - 4.6 mg/dL   Albumin 2.4 (L) 3.5 - 5.0 g/dL   GFR calc non Af Amer >60 >60 mL/min   GFR calc Af Amer >60 >60 mL/min    Comment: (NOTE) The eGFR has been calculated using the CKD EPI equation. This calculation has not been validated in all clinical situations. eGFR's persistently <60 mL/min signify possible Chronic Kidney Disease.    Anion gap 4 (L) 5 - 15  Glucose, capillary     Status: Abnormal   Collection Time: 09/19/15 11:41 AM  Result Value Ref Range   Glucose-Capillary 109 (H) 65 - 99 mg/dL  Glucose, capillary     Status: Abnormal   Collection Time: 09/19/15  6:47 PM  Result Value Ref Range   Glucose-Capillary 105 (H) 65 - 99 mg/dL  Hemoglobin and hematocrit, blood     Status: Abnormal   Collection Time: 09/19/15  8:48 PM  Result Value  Ref Range   Hemoglobin 8.8 (L) 13.0 - 17.0 g/dL    Comment: DELTA CHECK NOTED POST TRANSFUSION SPECIMEN    HCT 26.1 (L) 39.0 - 52.0 %  Renal function panel     Status: Abnormal   Collection Time: 09/19/15 10:00 PM  Result Value Ref Range   Sodium 139 135 - 145 mmol/L   Potassium 3.3 (L) 3.5 - 5.1 mmol/L   Chloride 121 (H) 101 - 111 mmol/L   CO2 14 (L) 22 - 32 mmol/L   Glucose, Bld 123 (H) 65 - 99 mg/dL   BUN <5 (L) 6 - 20 mg/dL   Creatinine, Ser 0.93 0.61 - 1.24 mg/dL   Calcium 7.4 (L) 8.9 - 10.3 mg/dL   Phosphorus 2.7 2.5 - 4.6 mg/dL   Albumin 2.5 (L) 3.5 - 5.0 g/dL   GFR calc non Af Amer >60 >60 mL/min   GFR calc Af Amer >60 >60 mL/min    Comment: (NOTE) The eGFR has been calculated using the CKD EPI equation. This calculation has not been validated in all clinical situations. eGFR's persistently <60 mL/min signify possible Chronic Kidney Disease.    Anion gap 4 (L) 5 - 15  Glucose, capillary     Status: Abnormal   Collection Time: 09/19/15 10:42 PM  Result Value Ref Range   Glucose-Capillary 151 (H) 65 - 99 mg/dL  Protime-INR     Status: Abnormal   Collection Time: 09/20/15  3:58 AM  Result Value Ref Range   Prothrombin Time 15.6 (H) 11.6 -  15.2 seconds   INR 1.22 0.00 - 1.49  CBC     Status: Abnormal   Collection Time: 09/20/15  3:58 AM  Result Value Ref Range   WBC 11.0 (H) 4.0 - 10.5 K/uL   RBC 3.25 (L) 4.22 - 5.81 MIL/uL   Hemoglobin 9.6 (L) 13.0 - 17.0 g/dL   HCT 27.6 (L) 39.0 - 52.0 %   MCV 84.9 78.0 - 100.0 fL   MCH 29.5 26.0 - 34.0 pg   MCHC 34.8 30.0 - 36.0 g/dL   RDW 17.4 (H) 11.5 - 15.5 %   Platelets 285 150 - 400 K/uL  Magnesium     Status: Abnormal   Collection Time: 09/20/15  3:58 AM  Result Value Ref Range   Magnesium 1.6 (L) 1.7 - 2.4 mg/dL  Renal function panel     Status: Abnormal   Collection Time: 09/20/15  3:58 AM  Result Value Ref Range   Sodium 139 135 - 145 mmol/L   Potassium 3.2 (L) 3.5 - 5.1 mmol/L   Chloride 119 (H) 101 - 111  mmol/L   CO2 14 (L) 22 - 32 mmol/L   Glucose, Bld 93 65 - 99 mg/dL   BUN <5 (L) 6 - 20 mg/dL   Creatinine, Ser 0.86 0.61 - 1.24 mg/dL   Calcium 7.4 (L) 8.9 - 10.3 mg/dL   Phosphorus 3.4 2.5 - 4.6 mg/dL   Albumin 2.5 (L) 3.5 - 5.0 g/dL   GFR calc non Af Amer >60 >60 mL/min   GFR calc Af Amer >60 >60 mL/min    Comment: (NOTE) The eGFR has been calculated using the CKD EPI equation. This calculation has not been validated in all clinical situations. eGFR's persistently <60 mL/min signify possible Chronic Kidney Disease.    Anion gap 6 5 - 15  Glucose, capillary     Status: None   Collection Time: 09/20/15  6:21 AM  Result Value Ref Range   Glucose-Capillary 86 65 - 99 mg/dL  Glucose, capillary     Status: Abnormal   Collection Time: 09/20/15 11:33 AM  Result Value Ref Range   Glucose-Capillary 122 (H) 65 - 99 mg/dL   Comment 1 Notify RN    No results found.     Medical Problem List and Plan: 1. Functional deficits secondary to debilitation related to Klebsiella pneumonia bacteremia HIV/right AKA/small bowel obstruction/upper GI bleed  2.  DVT Prophylaxis/Anticoagulation/history DVT: Eliquis 10 mg twice a day 7 days then 5 mg twice a day 3. Pain Management: Oxycodone as needed. Monitor with increased mobility  4. Acute on chronic anemia. Follow-up CBC . Chronic Coumadin remains on hold  5. Neuropsych: This patient is  capable of making decisions on his  own behalf. 6. Skin/Wound Care/stage II pressure ulcer: Routine skin checks . Foam dressing as recommended by wound care nurse 7. Fluids/Electrolytes/Nutrition: Routine I&O with follow-up chemistries 8. Acute on chronic kidney disease/hyponatremia/hypokalemia. Likely secondary to ATN. Follow-up renal services 9. HIV. CD4 decreased from 700 to  219. Antivirals have been resumed.   Post Admission Physician Evaluation: 1. Functional deficits secondary to debilitation related to Klebsiella pneumonia bacteremia,  small bowel  obstruction and upper GI bleed  Wit hx of HIV/right AKA. 2. Patient is admitted to receive collaborative, interdisciplinary care between the physiatrist, rehab nursing staff, and therapy team. 3. Patient's level of medical complexity and substantial therapy needs in context of that medical necessity cannot be provided at a lesser intensity of care such as a SNF. 4.  Patient has experienced substantial functional loss from his/her baseline which was documented above under the "Functional History" and "Functional Status" headings.  Judging by the patient's diagnosis, physical exam, and functional history, the patient has potential for functional progress which will result in measurable gains while on inpatient rehab.  These gains will be of substantial and practical use upon discharge  in facilitating mobility and self-care at the household level. 5. Physiatrist will provide 24 hour management of medical needs as well as oversight of the therapy plan/treatment and provide guidance as appropriate regarding the interaction of the two. 6. 24 hour rehab nursing will assist with bladder management, bowel management, safety, skin/wound care, disease management, medication administration, pain management and patient education and help integrate therapy concepts, techniques,education, etc. 7. PT will assess and treat for/with: Lower extremity strength, range of motion, stamina, balance, functional mobility, safety, adaptive techniques and equipment, woundcare, coping skills, pain control, education.   Goals are: Mod I. 8. OT will assess and treat for/with: ADL's, functional mobility, safety, upper extremity strength, adaptive techniques and equipment, wound mgt, ego support, and community reintegration.   Goals are: Mod I. Therapy may proceed with showering this patient. 9. Case Management and Social Worker will assess and treat for psychological issues and discharge planning. 10. Team conference will be held weekly  to assess progress toward goals and to determine barriers to discharge. 11. Patient will receive at least 3 hours of therapy per day at least 5 days per week. 12. ELOS: 5-7 days.       13. Prognosis:  good   Delice Lesch, MD  09/20/2015

## 2015-09-20 NOTE — Progress Notes (Signed)
Patient ID: [name], male   DOB: Oct 03, 1961, 54 y.o.   MRN: [id number] Medicine attending: I examined this patient this morning together with resident physician Dr. Shela Leff. We appreciate ongoing input from nephrology. We will follow recommendations. I agree with not giving additional DDAVP at this time. I would also like to stop parenteral bicarbonate and use oral bicarbonate if still needed. I would like to stop intravenous fluids and allow him to equilibrate. Re-evaluate urine electrolytes and osmolarity is recommended. Overall he continues to make progress and is tolerating a liquid diet which we will advance.

## 2015-09-20 NOTE — Progress Notes (Signed)
Physical Therapy Treatment Patient Details Name: [name] MRN: [id number] DOB: 19-Feb-1961 Today's Date: 09/20/2015    History of Present Illness 54 yo M with hx of HIV+, R AKA due to DVT, CKD2, bilateral femoral head AVN.   Admitted in transfer from [facility] where he was admitted on 11-7 dark stools and hematemesis. He was found to be acidotic, hyponatremic, and required intubation. He was found to have bilateral pneumonia (possible aspiration), SBO as well as INR > 10.      PT Comments    Pt was noted to be up with walker but declined to wear prosthesis.  Given his effort and fluidity with gait after agreeing to work, his expectations for rehab are good.  However, he is not sure about the fit of his leg and may not have been using as much at home.  Follow Up Recommendations  CIR     Equipment Recommendations  None recommended by PT    Recommendations for Other Services       Precautions / Restrictions Precautions Precautions: Fall Restrictions Weight Bearing Restrictions: No    Mobility  Bed Mobility Overal bed mobility: Modified Independent Bed Mobility: Supine to Sit;Sit to Supine              Transfers Overall transfer level: Needs assistance Equipment used: Rolling walker (2 wheeled);2 person hand held assist Transfers: Sit to/from Stand Sit to Stand: Min assist;Mod assist (from lower surfaces, min higher surfaces)         General transfer comment: reminded hand placement then pt did independently  Ambulation/Gait Ambulation/Gait assistance: Min assist Ambulation Distance (Feet): 100 Feet Assistive device: Rolling walker (2 wheeled);1 person hand held assist (second person for equpment and safety)   Gait velocity: controlled Gait velocity interpretation: Below normal speed for age/gender General Gait Details: slow and steady swing forward.   Stairs            Wheelchair Mobility    Modified Rankin (Stroke Patients Only)        Balance Overall balance assessment: Needs assistance Sitting-balance support: Single extremity supported Sitting balance-Leahy Scale: Good     Standing balance support: Bilateral upper extremity supported Standing balance-Leahy Scale: Poor                      Cognition Arousal/Alertness: Awake/alert Behavior During Therapy: WFL for tasks assessed/performed Overall Cognitive Status: Within Functional Limits for tasks assessed                      Exercises      General Comments General comments (skin integrity, edema, etc.): Pt has a prosthesis for walk in closet and declined to wear it after PT had called family to get this      Pertinent Vitals/Pain Pain Assessment: No/denies pain    Home Living                      Prior Function            PT Goals (current goals can now be found in the care plan section) Acute Rehab PT Goals Patient Stated Goal: get home Progress towards PT goals: Progressing toward goals    Frequency  Min 2X/week    PT Plan Current plan remains appropriate    Co-evaluation             End of Session Equipment Utilized During Treatment: Gait belt Activity Tolerance: Patient limited by  fatigue Patient left: in bed;with call bell/phone within reach;with bed alarm set     Time: 1002-1026 PT Time Calculation (min) (ACUTE ONLY): 24 min  Charges:  $Gait Training: 8-22 mins $Therapeutic Activity: 8-22 mins                    G Codes:      Ramond Dial 2015/10/02, 12:06 PM   Mee Hives, PT MS Acute Rehab Dept. Number: [facility] [phone] and [place] [phone]

## 2015-09-20 NOTE — Progress Notes (Signed)
Rehab admissions - I met with patient and his daughter.  Daughter would like for patient to come to inpatient rehab.  Patient reluctantly agreeable.  I spoke with resident who says patient is not medically ready today for rehab.  Will continue to follow for progress and medical stability.  Call me for questions.  #[phone]

## 2015-09-20 NOTE — Progress Notes (Addendum)
Subjective: Patient was seen and examined at bedside this morning. He was sitting up in the bed watching TV. Reported feeling well and not having any complaints at present. Denied having any CP or SOB. He seemed calm and was answering questions appropriately. He is tolerating PO intake well.   Objective: Vital signs in last 24 hours: Filed Vitals:   09/19/15 1813 09/19/15 2023 09/20/15 0623 09/20/15 1135  BP: 122/64 100/56 95/62 108/58  Pulse: 107 92 103 96  Temp: 99.3 F (37.4 C) 98.8 F (37.1 C) 98.6 F (37 C) 98.7 F (37.1 C)  TempSrc: Oral Oral Oral Oral  Resp: '20 20  18  ' Height:      Weight:      SpO2: 100% 100% 100% 100%   Weight change:   Intake/Output Summary (Last 24 hours) at 09/20/15 1342 Last data filed at 09/20/15 1301  Gross per 24 hour  Intake   5072 ml  Output   5251 ml  Net   -179 ml   Physical Exam  Constitutional: He is oriented to person, place, and time. No distress.  Cachectic   Cardiovascular: Normal rate, regular rhythm and intact distal pulses.  Exam reveals no gallop and no friction rub.   No murmur heard. Pulmonary/Chest: Effort normal and breath sounds normal. No respiratory distress. He has no wheezes. He has no rales.  Abdominal: Soft. Bowel sounds are normal. He exhibits no distension. There is no tenderness. There is no rebound and no guarding.  Neurological: He is alert and oriented to person, place, and time.  Skin: Skin is warm and dry.   Lab Results: Basic Metabolic Panel:  Recent Labs Lab 09/19/15 0520  09/19/15 2200 09/20/15 0358  NA  --   < > 139 139  K  --   < > 3.3* 3.2*  CL  --   < > 121* 119*  CO2  --   < > 14* 14*  GLUCOSE  --   < > 123* 93  BUN  --   < > <5* <5*  CREATININE  --   < > 0.93 0.86  CALCIUM  --   < > 7.4* 7.4*  MG 2.2  --   --  1.6*  PHOS  --   < > 2.7 3.4  < > = values in this interval not displayed. Liver Function Tests:  Recent Labs Lab 09/16/15 1523 09/17/15 0535  09/19/15 2200  09/20/15 0358  AST 40 34  --   --   --   ALT 24 23  --   --   --   ALKPHOS 86 82  --   --   --   BILITOT 0.8 0.5  --   --   --   PROT 5.2* 5.0*  --   --   --   ALBUMIN 2.4* 2.2*  < > 2.5* 2.5*  < > = values in this interval not displayed. CBC:  Recent Labs Lab 09/18/15 0450 09/19/15 0520 09/19/15 2048 09/20/15 0358  WBC 10.6* 10.9*  --  11.0*  NEUTROABS 7.8*  --   --   --   HGB 7.2* 6.8* 8.8* 9.6*  HCT 21.0* 20.1* 26.1* 27.6*  MCV 85.7 86.3  --  84.9  PLT 301 324  --  285   Coagulation:  Recent Labs Lab 09/17/15 0535 09/18/15 0450 09/19/15 0520 09/20/15 0358  LABPROT 16.7* 16.3* 16.6* 15.6*  INR 1.34 1.30 1.33 1.22   Urine Drug Screen: Drugs of  Abuse     Component Value Date/Time   LABOPIA * 06/23/2009 1831    POSITIVE (NOTE) Result repeated and verified. Sent for confirmatory testing   COCAINSCRNUR NEGATIVE 06/23/2009 1831   LABBENZ NEGATIVE 06/23/2009 1831   AMPHETMU NEGATIVE 06/23/2009 1831    Micro Results: Recent Results (from the past 240 hour(s))  MRSA PCR Screening     Status: None   Collection Time: 09/10/15  6:23 PM  Result Value Ref Range Status   MRSA by PCR NEGATIVE NEGATIVE Final    Comment:        The GeneXpert MRSA Assay (FDA approved for NASAL specimens only), is one component of a comprehensive MRSA colonization surveillance program. It is not intended to diagnose MRSA infection nor to guide or monitor treatment for MRSA infections.   Culture, blood (routine x 2)     Status: None   Collection Time: 09/10/15 11:25 PM  Result Value Ref Range Status   Specimen Description BLOOD LEFT ANTECUBITAL  Final   Special Requests BOTTLES DRAWN AEROBIC AND ANAEROBIC 10CC   Final   Culture NO GROWTH 5 DAYS  Final   Report Status 09/15/2015 FINAL  Final  Culture, blood (routine x 2)     Status: None   Collection Time: 09/10/15 11:30 PM  Result Value Ref Range Status   Specimen Description BLOOD RIGHT ARM  Final   Special Requests BOTTLES  DRAWN AEROBIC AND ANAEROBIC 5CC  Final   Culture NO GROWTH 5 DAYS  Final   Report Status 09/16/2015 FINAL  Final   Studies/Results: No results found. Medications: I have reviewed the patient's current medications. Scheduled Meds: . sodium chloride   Intravenous Once  . antiseptic oral rinse  7 mL Mouth Rinse q12n4p  . chlorhexidine  15 mL Mouth Rinse BID  . enoxaparin (LOVENOX) injection  40 mg Subcutaneous Q24H  . feeding supplement (ENSURE ENLIVE)  237 mL Oral QID  . insulin aspart  0-9 Units Subcutaneous 4 times per day  . multivitamin with minerals  1 tablet Oral Daily  . nutrition supplement (JUVEN)  1 packet Oral BID WC  . pantoprazole (PROTONIX) IV  40 mg Intravenous Q24H  . sodium bicarbonate  1,300 mg Oral TID   Continuous Infusions: . dextrose 125 mL/hr at 09/20/15 0838   PRN Meds:.acetaminophen, oxyCODONE, sodium chloride Assessment/Plan: Active Problems:   Human immunodeficiency virus (HIV) disease (HCC)   Alcohol abuse   Status post above knee amputation ([place])   Long term current use of anticoagulant   Avascular necrosis of femoral head (HCC)   Septic shock (HCC)   Gram-negative bacteremia (HCC)   Abdominal pain   Pneumonia due to Klebsiella pneumoniae (HCC)   Pressure ulcer   Protein-calorie malnutrition, severe   SBO (small bowel obstruction) (HCC)   Hyperosmolality and/or hypernatremia   Hypomagnesemia   Hypophosphatemia  Septic shock 2/2 to Klebsiella pneumoniae Bacteremia likely from GI (aspiration) vs lung: Resolved.  Was on pressors at [facility]. Imaging shows bilateral pneumonia has improved since 11/8. Afebrile currently. Blood culture showing no growth in 5 days. Satting 100% on RA. He was treated with antibiotics for 10 days, initially started at the other hospital on admission there on November 7. Unasyn was discontinued on 11/17.   Partial SBO: Resolved. Abdominal X-ray showing partial SBO, improved since 11/8. Patient was previously on  NGT which he pulled out a few nights ago. He was previously given an enema and abdominal exam improved after that- no abdominal tenderness  or distention and +BS on exam today. He is tolerating PO intake (soft diet) well and having regular bowel movements.   - Hydromorphone 1 mg q4h prn - Soft (dysphagia 3) diet   Hematemesis: Unclear source of upper GI bleed. Hgb 8.9 on 11/15, with baseline over 13. Likely in the setting of elevated INR during previous hospitalization. INR 1.22 today. However, Hgb dropped to 6.8 on 11/20 and patient was transfused with 2 U PRBCs. Low hgb could partially be due to hemodilution because patient has been receiving a lot of fluids to correct his electrolyte abnormalities. Hgb stable at 9.6 today.  - Protonix IV 40 mg - Monitor H/H  GB dysfunction: cholestasis, EF 27% per HIDA.  - GI on board, appreciate recs  High INR without warfarin - with History of Unprovoked DVT:  Patient had consistently been supratherapeutic at anti-coag visits and presented to 10 at [facility]. We held Coumadin for this reason. High INR has resolved now, 1.22 today.  - Lovenox per pharmacy  - F/u daily cbc and INR  Acute on Chronic Kidney Disease - likely 2/2 to ATN: Baseline creatinine of ~1.2. Was in 7's at outside hospital, SCr 0.86 today.   - continue to monitor.  - F/u daily BMP  Non gap Metabolic Acidosis: AG is resolved as no longer has lactic acidosis but persistently has low bicarb (14 today).  - appreciate nephro recs.  - Transitioned from IV bicarb to oral sodium bicarbonate 1300 mg TID.  Hypokalemia and hypoMag - K low (3.2) this morning. Mag low (1.6). Low Mag likely due to severe malnutrition, acute illness, and/ or chronic ETOH use.  -Transitioned from IV to oral potassium supplementation - Magnesium repleted  - Start Urocit K 30mq bid until K replete then d/c. Patient already received K supplementation today, will start this medication tomorrow.  - Renal function panel  q12 hrs   Hypophosphatemia - Phosphorous improved to 3.4 today. Low phosphorous likely in the setting of severe malnutrition and chronic ETOH use.  - Kphos tabs 1500 mg BID and replete - Once K, Mg stable, stop those and wait 72 h and look at urine studies.  Hypernatremia - likely 2/2 to free water deficit Received Normal Saline extensively before [place] to [place] improved to 139 today.  -Discontinue D5  High urine output: Dose of DDAVP given on 11/16 for possible diabetes insipidus. Urine output 5.6 L in the past 24 hrs. His urine OSM are not compatible with DI incomplete or complete. May have some incomplete DI with post ATN diuresis, and low Mg/K which prohibit concentrating.  -D5 @ 125 cc/hr -Giving another dose of DDAVP (3 mcg) this morning. Will hold tomorrow and allow SNa to rise and use po DDAVP if needed.Would restudy U Osm if off DDAVP 72 hrs.  -Appreciate nephrology recs    Addendum (09/20/15 at 2:40 pm) - discontinue D5  HIV: CD4 decreased from 700 to 219 in a matter of 2 weeks, likely in the setting of acute illness. Viral load <20 in 07/2015.    -Restart anti-retroviral regimen when PO intake improves/ patient more cooperative with oral meds  -F/u HLA B-57 (01)  Elevated AST: To 62. Likely downtrending from previous septic shock, but could represent underlying liver dysfunction driving up his INR. AST normal now.     Stage II pressure ulcer -Foam dressing as recommended by wound care   Avascular Necrosis of Hip and Phantom Limb Pain: Pain management per above.   DVT Prophylaxis:  Lovenox 40 mg daily   Code Status: Full  Dispo: Disposition is deferred at this time, awaiting improvement of current medical problems.  Anticipated discharge in approximately 2-3 day(s).   The patient does have a current PCP Iline Oven, MD) and does need an [facility] hospital follow-up appointment after discharge.  The patient does not have transportation limitations that hinder  transportation to clinic appointments.  .Services Needed at time of discharge: Y = Yes, Blank = No PT:   OT:   RN:   Equipment:   Other:     LOS: 10 days   Shela Leff, MD 09/20/2015, 1:42 PM

## 2015-09-20 NOTE — Progress Notes (Signed)
Nutrition Follow-up  DOCUMENTATION CODES:   Severe malnutrition in context of chronic illness, Underweight  INTERVENTION:  Provide Ensure Enlive po QID, each supplement provides 350 kcal and 20 grams of protein Provide Juven po BID Provide Multivitamin with minerals daily Encourage PO intake   NUTRITION DIAGNOSIS:   Inadequate oral intake related to altered GI function as evidenced by NPO status.  Ongoing; diet advanced, but PO intake remains inadequate  GOAL:   Patient will meet greater than or equal to 90% of their needs  Unmet  MONITOR:   PO intake, Weight trends, Labs, Skin  REASON FOR ASSESSMENT:   Consult New TPN/TNA  ASSESSMENT:   54 yo M with hx of HIV+ (ATVc/TFV/DTGV), R AKA due to DVT, CKD2, bilateral femoral head AVN; Admitted in transfer from [facility] where he was admitted on 11-7 dark stools and hematemesis. He was found to be acidotic, hyponatremic, and required intubation. He was found to have bilateral pneumonia (possible aspiration), SBO as well as INR > 10. He was transfused both blood and FFP. He was started on vanco/imipenem/levaquin. His Bcx were positive for klebsiella.  Patient was advanced to a full liquid diet on 11/18 and a Dysphagia 3 diet on 11/19 and he is now off of TPN. Per nursing notes, pt is eating 20-45% of meals. Pt states that his appetite is good and he ate 100% of breakfast, but per RN notes, pt ate 25% of breakfast. Pt states that PTA he was drinking Ensure supplement 4 times per day, but not eating much else due to poor appetite. He feels that SBO was caused by eating a lot of pork rinds.   Labs: low hemoglobin, low magnesium, low potassium, elevated chloride, low calcium  Diet Order:  DIET DYS 3 Room service appropriate?: Yes; Fluid consistency:: Thin  Skin:  Wound (see comment) (Stage II pressure ulcer on sacrum)  Last BM:  11/19  Height:   Ht Readings from Last 1 Encounters:  09/17/15 6' (1.829 m)    Weight:    Wt Readings from Last 1 Encounters:  09/19/15 90 lb 13.3 oz (41.2 kg)    Ideal Body Weight:  81 kg  BMI:  Body mass index is 12.32 kg/(m^2).  Estimated Nutritional Needs:   Kcal:  1600-1800  Protein:  80-90 gm  Fluid:  1.6-1.8 L  EDUCATION NEEDS:   No education needs identified at this time  [place], LDN Inpatient Clinical Dietitian Pager: [phone] After Hours Pager: [phone]

## 2015-09-21 LAB — CBC
HCT: 31.2 % — ABNORMAL LOW (ref 39.0–52.0)
HEMOGLOBIN: 10.6 g/dL — AB (ref 13.0–17.0)
MCH: 29.4 pg (ref 26.0–34.0)
MCHC: 34 g/dL (ref 30.0–36.0)
MCV: 86.4 fL (ref 78.0–100.0)
Platelets: 336 10*3/uL (ref 150–400)
RBC: 3.61 MIL/uL — AB (ref 4.22–5.81)
RDW: 18.2 % — ABNORMAL HIGH (ref 11.5–15.5)
WBC: 11.2 10*3/uL — AB (ref 4.0–10.5)

## 2015-09-21 LAB — RENAL FUNCTION PANEL
ALBUMIN: 3 g/dL — AB (ref 3.5–5.0)
ALBUMIN: 2.9 g/dL — AB (ref 3.5–5.0)
ANION GAP: 5 (ref 5–15)
Anion gap: 6 (ref 5–15)
BUN: 8 mg/dL (ref 6–20)
BUN: 9 mg/dL (ref 6–20)
CALCIUM: 8.3 mg/dL — AB (ref 8.9–10.3)
CO2: 12 mmol/L — ABNORMAL LOW (ref 22–32)
CO2: 13 mmol/L — ABNORMAL LOW (ref 22–32)
CREATININE: 0.9 mg/dL (ref 0.61–1.24)
Calcium: 8.4 mg/dL — ABNORMAL LOW (ref 8.9–10.3)
Chloride: 122 mmol/L — ABNORMAL HIGH (ref 101–111)
Chloride: 120 mmol/L — ABNORMAL HIGH (ref 101–111)
Creatinine, Ser: 0.95 mg/dL (ref 0.61–1.24)
GFR calc Af Amer: >60 mL/min (ref >60)
GLUCOSE: 80 mg/dL (ref 65–99)
Glucose, Bld: 126 mg/dL — ABNORMAL HIGH (ref 65–99)
PHOSPHORUS: 2.8 mg/dL (ref 2.5–4.6)
PHOSPHORUS: 1.3 mg/dL — AB (ref 2.5–4.6)
POTASSIUM: 4.2 mmol/L (ref 3.5–5.1)
Potassium: 3.5 mmol/L (ref 3.5–5.1)
SODIUM: 140 mmol/L (ref 135–145)
Sodium: 138 mmol/L (ref 135–145)

## 2015-09-21 LAB — GLUCOSE, CAPILLARY
GLUCOSE-CAPILLARY: 143 mg/dL — AB (ref 65–99)
GLUCOSE-CAPILLARY: 61 mg/dL — AB (ref 65–99)
GLUCOSE-CAPILLARY: 133 mg/dL — AB (ref 65–99)
GLUCOSE-CAPILLARY: 141 mg/dL — AB (ref 65–99)
Glucose-Capillary: 147 mg/dL — ABNORMAL HIGH (ref 65–99)

## 2015-09-21 LAB — URINALYSIS, ROUTINE W REFLEX MICROSCOPIC
BILIRUBIN URINE: NEGATIVE
Glucose, UA: >1000 mg/dL — AB
Ketones, ur: NEGATIVE mg/dL
Leukocytes, UA: NEGATIVE
Nitrite: NEGATIVE
Protein, ur: 100 mg/dL — AB
SPECIFIC GRAVITY, URINE: 1.022 (ref 1.005–1.030)
pH: 7.5 (ref 5.0–8.0)

## 2015-09-21 LAB — MAGNESIUM: MAGNESIUM: 1.8 mg/dL (ref 1.7–2.4)

## 2015-09-21 LAB — URINE MICROSCOPIC-ADD ON

## 2015-09-21 LAB — OSMOLALITY, URINE: OSMOLALITY UR: 447 mosm/kg (ref 300–900)

## 2015-09-21 LAB — PROTIME-INR
INR: 1.2 (ref 0.00–1.49)
Prothrombin Time: 15.4 seconds — ABNORMAL HIGH (ref 11.6–15.2)

## 2015-09-21 LAB — NA AND K (SODIUM & POTASSIUM), RAND UR
Potassium Urine: 34 mmol/L
Sodium, Ur: 74 mmol/L

## 2015-09-21 MED ORDER — ATAZANAVIR-COBICISTAT 300-150 MG PO TABS
1.0000 | ORAL_TABLET | Freq: Every day | ORAL | Status: DC
  Filled 2015-09-21: qty 1

## 2015-09-21 MED ORDER — DOLUTEGRAVIR SODIUM 50 MG PO TABS
50.0000 mg | ORAL_TABLET | Freq: Every day | ORAL | Status: DC
  Filled 2015-09-21: qty 1

## 2015-09-21 MED ORDER — TENOFOVIR DISOPROXIL FUMARATE 300 MG PO TABS
300.0000 mg | ORAL_TABLET | Freq: Every day | ORAL | Status: DC
  Filled 2015-09-21: qty 1

## 2015-09-21 MED ORDER — POTASSIUM CHLORIDE CRYS ER 20 MEQ PO TBCR
20.0000 meq | EXTENDED_RELEASE_TABLET | Freq: Two times a day (BID) | ORAL | Status: DC
  Administered 2015-09-21 – 2015-09-22 (×3): 20 meq via ORAL
  Filled 2015-09-21 (×3): qty 1

## 2015-09-21 MED ORDER — PANTOPRAZOLE SODIUM 40 MG PO TBEC
40.0000 mg | DELAYED_RELEASE_TABLET | Freq: Every day | ORAL | Status: DC
  Administered 2015-09-22: 40 mg via ORAL
  Filled 2015-09-21: qty 1

## 2015-09-21 NOTE — Progress Notes (Signed)
Physical Therapy Treatment Patient Details Name: [name] MRN: [id number] DOB: [birth year] Today's Date: 09/21/2015    History of Present Illness 54 yo M with hx of HIV+, R AKA due to DVT, CKD2, bilateral femoral head AVN.   Admitted in transfer from [facility] where he was admitted on 11-7 dark stools and hematemesis. He was found to be acidotic, hyponatremic, and required intubation. He was found to have bilateral pneumonia (possible aspiration), SBO as well as INR > 10.      PT Comments    Patient continues to be limited by elevated HR up to 147 with ambulation.  Also depressed today with reports of a friend close to passing.  States daughter coming up later.  Encouraged to call RN for chaplain visit if desired.  Follow Up Recommendations  CIR     Equipment Recommendations  None recommended by PT    Recommendations for Other Services       Precautions / Restrictions Precautions Precautions: Fall Precaution Comments: watch HR    Mobility  Bed Mobility Overal bed mobility: Needs Assistance       Supine to sit: Min assist     General bed mobility comments: increased time, pt with difficulty processing how to sit up, cues for use of rail and to scoot to edge of bed  Transfers   Equipment used: Rolling walker (2 wheeled) Transfers: Sit to/from Stand Sit to Stand: Mod assist         General transfer comment: lifting assist from bed, lowering assist for safety to chair with cues for hand placement  Ambulation/Gait Ambulation/Gait assistance: Min assist Ambulation Distance (Feet): 50 Feet (x 2 with seated rest in hall) Assistive device: Rolling walker (2 wheeled)       General Gait Details: slow, wearing shoe, limited due to HR up to 147   Stairs            Wheelchair Mobility    Modified Rankin (Stroke Patients Only)       Balance Overall balance assessment: Needs assistance         Standing balance support: Bilateral upper  extremity supported Standing balance-Leahy Scale: Poor Standing balance comment: UE support on walker                    Cognition Arousal/Alertness: Awake/alert Behavior During Therapy: Flat affect Overall Cognitive Status: Within Functional Limits for tasks assessed                      Exercises      General Comments        Pertinent Vitals/Pain Faces Pain Scale: No hurt    Home Living                      Prior Function            PT Goals (current goals can now be found in the care plan section) Progress towards PT goals: Progressing toward goals (added gait goal)    Frequency  Min 3X/week    PT Plan Current plan remains appropriate    Co-evaluation             End of Session Equipment Utilized During Treatment: Gait belt Activity Tolerance: Patient limited by fatigue;Treatment limited secondary to medical complications (Comment) (elevated HR) Patient left: with call bell/phone within reach;in chair     Time: 1115-1135 PT Time Calculation (min) (ACUTE ONLY): 20 min  Charges:  $  Gait Training: 8-22 mins                    G Codes:      WYNN,CYNDI 2015/10/01, 1:10 PM  Magda Kiel, [place] 10/01/15

## 2015-09-21 NOTE — Progress Notes (Signed)
Active SNF bed search in progress as additional option to CIR placement.  Awaiting MD's determination re: stability.  Daughter's primary preference is for CIR and only wants SNF if no other option.  CIR is considering patient.  MD note indicates anticipated d/c in 24-48 hours;  Will need further clarification as soon as possible due to upcoming Thanksgiving holiday. If patient will require SNF placement- will need to complete admission arrangements by tomorrow.  Thanks!  Lorie Phenix. Pauline Good, [place]

## 2015-09-21 NOTE — Progress Notes (Signed)
Subjective: Interval History: has complaints wants to move, cold in room, uncomfortable.  Objective: Vital signs in last 24 hours: Temp:  [97.5 F (36.4 C)-98.7 F (37.1 C)] 97.5 F (36.4 C) (11/22 0409) Pulse Rate:  [96-111] 100 (11/22 0409) Resp:  [18] 18 (11/22 0409) BP: (108-117)/(58-63) 117/61 mmHg (11/22 0409) SpO2:  [98 %-100 %] 98 % (11/22 0409) Weight:  [41.2 kg (90 lb 13.3 oz)] 41.2 kg (90 lb 13.3 oz) (11/22 0409) Weight change:   Intake/Output from previous day: 11/21 0701 - 11/22 0700 In: 3993.7 [P.O.:1560; I.V.:2083.7; IV Piggyback:350] Out: 5176 [Urine:5175; Stool:1] Intake/Output this shift: Total I/O In: 240 [P.O.:240] Out: 350 [Urine:350]  General appearance: cooperative, cachectic and no distress Resp: diminished breath sounds bilaterally and rales bibasilar Cardio: S1, S2 normal GI: pos bs, soft, liver down 5 cm Extremities: RAKA  Lab Results:  Recent Labs  09/20/15 0358 09/21/15 0552  WBC 11.0* 11.2*  HGB 9.6* 10.6*  HCT 27.6* 31.2*  PLT 285 336   BMET:  Recent Labs  09/20/15 1820 09/21/15 0552  NA 138 140  K 3.7 3.5  CL 121* 122*  CO2 14* 12*  GLUCOSE 118* 80  BUN <5* 8  CREATININE 0.84 0.90  CALCIUM 7.8* 8.3*   No results for input(s): PTH in the last 72 hours. Iron Studies: No results for input(s): IRON, TIBC, TRANSFERRIN, FERRITIN in the last 72 hours.  Studies/Results: No results found.  I have reviewed the patient's current medications.  Assessment/Plan: 1 AKI resolved 2 E'lyte abn.  SNa stable, still diuresing follow   Low phos suspect still not replete cont po    Low Mg follow closely as may need more   Low K stable on repletion    Low bicarb on citrate (source of bicarb) and Na bicarb, follow Rec as above    LOS: 11 days   Ontario Pettengill L 09/21/2015,9:05 AM

## 2015-09-21 NOTE — NC FL2 (Deleted)
[place] LEVEL OF CARE SCREENING TOOL     IDENTIFICATION  Patient Name: [name] Birthdate: 08-26-1961 Sex: male Admission Date (Current Location): 09/10/2015  [place] and [place] Number: Kathleen Argue [id number] Facility and Address:  [facility], [place] [address] [place], [place] [address]      Provider Number: [id number]  Attending Physician Name and Address:  Annia Belt, MD  Relative Name and Phone Number:   Dreson Nazareno [phone]    Current Level of Care: Hospital Recommended Level of Care: [place] Prior Approval Number:    Date Approved/Denied:   PASRR Number:  [id number]  Discharge Plan: SNF    Current Diagnoses: Patient Active Problem List   Diagnosis Date Noted  . Hypophosphatemia   . SBO (small bowel obstruction) ([place])   . Hyperosmolality and/or hypernatremia   . Hypomagnesemia   . Pressure ulcer 09/13/2015  . Protein-calorie malnutrition, severe 09/13/2015  . Abdominal pain   . Pneumonia due to Klebsiella pneumoniae ([place])   . Septic shock ([place]) 09/10/2015  . Gram-negative bacteremia ([place]) 09/10/2015  . Polyuria 08/06/2015  . Prediabetes 08/06/2015  . Hematuria 08/06/2015  . UTI (urinary tract infection) 05/14/2015  . Avascular necrosis of femoral head ([place]) 05/14/2015  . Left adrenal mass ([place]) 05/14/2015  . Hypokalemia 10/08/2014  . CKD (chronic kidney disease) 02/10/2014  . Hyperglycemia 12/29/2013  . Hyperbilirubinemia 12/29/2013  . Unintentional weight loss 11/04/2013  . Left hip pain 03/09/2013  . Preventative health care 03/09/2013  . Phantom limb syndrome with pain ([place]) 03/21/2012  . Long term current use of anticoagulant 11/20/2010  . ERECTILE DYSFUNCTION, ORGANIC 04/12/2010  . GERD 08/23/2009  . METHICILLIN RESISTANT STAPHYLOCOCCUS AUREUS INFECTION 08/05/2009  . ENLARGEMENT OF LYMPH NODES 07/20/2009  . Embolism and thrombosis of artery ([place]) 07/19/2009  .  HYPOGONADISM 07/15/2009  . DEFICIENCY OF OTHER VITAMINS 07/15/2009  . PULMONARY NODULE 07/15/2009  . Status post above knee amputation ([place]) 07/15/2009  . Human immunodeficiency virus (HIV) disease ([place]) 11/10/2006  . SHINGLES, RECURRENT 11/10/2006  . HEPATITIS B 11/10/2006  . DYSLIPIDEMIA 11/10/2006  . PSYCHOSIS 11/10/2006  . Alcohol abuse 11/10/2006  . CIGARETTE SMOKER 11/10/2006  . PERIPHERAL NEUROPATHY 11/10/2006  . ALLERGIC RHINITIS 11/10/2006  . DENTAL CARIES 11/10/2006    Orientation ACTIVITIES/SOCIAL BLADDER RESPIRATION    Self, Place, Situation  Passive Continent Normal  BEHAVIORAL SYMPTOMS/MOOD NEUROLOGICAL BOWEL NUTRITION STATUS      Continent Diet (diet dys )  PHYSICIAN VISITS COMMUNICATION OF NEEDS Height & Weight Skin  30 days Verbally 6' (182.9 cm) 90 lbs. Normal          AMBULATORY STATUS RESPIRATION    Supervision limited Normal      Personal Care Assistance Level of Assistance  Bathing, Feeding, Dressing Bathing Assistance: Limited assistance Feeding assistance: Independent Dressing Assistance: Limited assistance      Functional Limitations Info  Sight, Hearing, Speech Sight Info: Adequate Hearing Info: Adequate Speech Info: Adequate       SPECIAL CARE FACTORS FREQUENCY  PT (By licensed PT)     PT Frequency: 5x             Additional Factors Info  Code Status, Allergies Code Status Info: full Allergies Info: dapsone,            Current Medications (09/21/2015): Current Facility-Administered Medications  Medication Dose Route Frequency Provider Last Rate Last Dose  . 0.9 %  sodium chloride infusion   Intravenous Once Dellia Nims, MD      .  acetaminophen (TYLENOL) tablet 500 mg  500 mg Oral Q4H PRN Tasrif Ahmed, MD   500 mg at 09/19/15 1452  . antiseptic oral rinse (CPC / CETYLPYRIDINIUM CHLORIDE 0.05%) solution 7 mL  7 mL Mouth Rinse q12n4p Campbell Riches, MD   7 mL at 09/20/15 1600  . chlorhexidine (PERIDEX) 0.12 % solution  15 mL  15 mL Mouth Rinse BID Campbell Riches, MD   15 mL at 09/20/15 1057  . enoxaparin (LOVENOX) injection 40 mg  40 mg Subcutaneous Q24H Annia Belt, MD   40 mg at 09/17/15 0910  . feeding supplement (ENSURE ENLIVE) (ENSURE ENLIVE) liquid 237 mL  237 mL Oral QID Reanne J Barbato, RD   237 mL at 09/20/15 2313  . insulin aspart (novoLOG) injection 0-9 Units  0-9 Units Subcutaneous 4 times per day Eudelia Bunch, RPH   2 Units at 09/20/15 0000  . multivitamin with minerals tablet 1 tablet  1 tablet Oral Daily Lorenda Peck, RD   1 tablet at 09/20/15 1725  . nutrition supplement (JUVEN) (JUVEN) powder packet 1 packet  1 packet Oral BID WC Annia Belt, MD      . oxyCODONE (Oxy IR/ROXICODONE) immediate release tablet 5 mg  5 mg Oral Q6H PRN Shela Leff, MD   5 mg at 09/20/15 1445  . pantoprazole (PROTONIX) injection 40 mg  40 mg Intravenous Q24H Francesca Oman, DO   40 mg at 09/20/15 1057  . phosphorus (K PHOS NEUTRAL) tablet 750 mg  750 mg Oral BID Annia Belt, MD      . potassium chloride SA (K-DUR,KLOR-CON) CR tablet 20 mEq  20 mEq Oral BID Shela Leff, MD      . potassium citrate (UROCIT-K) SR tablet 40 mEq  40 mEq Oral BID WC Annia Belt, MD      . sodium bicarbonate tablet 1,300 mg  1,300 mg Oral TID Shela Leff, MD   1,300 mg at 09/20/15 2314  . sodium chloride 0.9 % injection 10-40 mL  10-40 mL Intracatheter PRN Campbell Riches, MD   10 mL at 09/21/15 [id number]   Do not use this list as official medication orders. Please verify with discharge summary.  Discharge Medications:   Medication List    ASK your doctor about these medications        aspirin 81 MG tablet  Take 81 mg by mouth daily.     ENSURE  Take 1 Can by mouth 3 (three) times daily between meals.     EVOTAZ 300-150 MG tablet  Generic drug:  atazanavir-cobicistat  TAKE 1 TABLET BY MOUTH DAILY. SWALLOW WHOLE. DO NOT CRUSH, CUT OR CHEW TABLET. TAKE WITH FOOD.      fenofibrate 145 MG tablet  Commonly known as:  TRICOR  Take 1 tablet (145 mg total) by mouth daily.     gabapentin 400 MG capsule  Commonly known as:  NEURONTIN  TAKE ONE CAPSULE BY MOUTH FOUR TIMES DAILY     morphine 60 MG 12 hr tablet  Commonly known as:  MS CONTIN  Take 2 tablets (120 mg total) by mouth every 12 (twelve) hours.     oxyCODONE 5 MG immediate release tablet  Commonly known as:  Oxy IR/ROXICODONE  Take 1 tablet (5 mg total) by mouth every 6 (six) hours as needed for severe pain.     potassium chloride SA 20 MEQ tablet  Commonly known as:  K-DUR,KLOR-CON  TAKE 1 TABLET BY MOUTH  EVERY DAY     [name] 50 MG tablet  Generic drug:  dolutegravir  TAKE 1 TABLET (50 MG TOTAL) BY MOUTH DAILY.     [name] 300 MG tablet  Generic drug:  tenofovir  TAKE 1 TABLET (300 MG TOTAL) BY MOUTH DAILY.     warfarin 5 MG tablet  Commonly known as:  COUMADIN  Take 2.5-5 mg by mouth daily. On Sunday Monday Wednesday Friday patient takes 2.5 mg and on all other days patient takes 5 mg.        Relevant Imaging Results:  Relevant Lab Results:  Recent Labs    Additional Information SS# [id number]  Soledad Gerlach, Student-SW [phone]

## 2015-09-21 NOTE — Progress Notes (Signed)
Patient ID: [name], male   DOB: 1961-03-04, 54 y.o.   MRN: [id number] Medicine attending: I examined this patient this morning together with resident physician Doctor Shela Leff and we discussed current management. The patient is much more interactive. Exam unchanged. We elected to stop intravenous fluids to allow him to equilibrate on his own. Renal function is back to normal except for persistent presumed renal tubular acidosis with decreased serum bicarbonate. We changed him to oral potassium, magnesium,  phosphate, and bicarbonate yesterday. He seems to be tolerating by mouth without problems. We will now go ahead and resume his antiretroviral drugs. Urine sodium 88, creatinine 32, osmolarity 507. Pattern consistent with SIADH. Anticipate transfer to extended care facility within the next 24-48 hours if otherwise stable.

## 2015-09-21 NOTE — Progress Notes (Signed)
Subjective: Patient was seen and examined at bedside this morning. Reported feeling well and not having any complaints at present. Denied having any CP, SOB, or abdominal pain. Reports having improved PO intake.    Objective: Vital signs in last 24 hours: Filed Vitals:   09/20/15 0623 09/20/15 1135 09/20/15 2028 09/21/15 0409  BP: 95/62 108/58 110/63 117/61  Pulse: 103 96 111 100  Temp: 98.6 F (37 C) 98.7 F (37.1 C) 98 F (36.7 C) 97.5 F (36.4 C)  TempSrc: Oral Oral Oral Oral  Resp:  '18 18 18  ' Height:      Weight:    90 lb 13.3 oz (41.2 kg)  SpO2: 100% 100% 98% 98%   Weight change:   Intake/Output Summary (Last 24 hours) at 09/21/15 1144 Last data filed at 09/21/15 0856  Gross per 24 hour  Intake 2537.91 ml  Output   4126 ml  Net -1588.09 ml   Physical Exam  Constitutional: He is oriented to person, place, and time. No distress.  Cachectic   Cardiovascular: Normal rate, regular rhythm and intact distal pulses.  Exam reveals no gallop and no friction rub.   No murmur heard. Pulmonary/Chest: Effort normal and breath sounds normal. No respiratory distress. He has no wheezes. He has no rales.  Abdominal: Soft. Bowel sounds are normal. He exhibits no distension. There is no tenderness. There is no rebound and no guarding.  Neurological: He is alert and oriented to person, place, and time.  Skin: Skin is warm and dry.   Lab Results: Basic Metabolic Panel:  Recent Labs Lab 09/20/15 0358 09/20/15 1820 09/21/15 0552  NA 139 138 140  K 3.2* 3.7 3.5  CL 119* 121* 122*  CO2 14* 14* 12*  GLUCOSE 93 118* 80  BUN <5* <5* 8  CREATININE 0.86 0.84 0.90  CALCIUM 7.4* 7.8* 8.3*  MG 1.6*  --  1.8  PHOS 3.4 1.8* 2.8   Liver Function Tests:  Recent Labs Lab 09/16/15 1523 09/17/15 0535  09/20/15 1820 09/21/15 0552  AST 40 34  --   --   --   ALT 24 23  --   --   --   ALKPHOS 86 82  --   --   --   BILITOT 0.8 0.5  --   --   --   PROT 5.2* 5.0*  --   --   --     ALBUMIN 2.4* 2.2*  < > 2.5* 3.0*  < > = values in this interval not displayed. CBC:  Recent Labs Lab 09/18/15 0450  09/20/15 0358 09/21/15 0552  WBC 10.6*  < > 11.0* 11.2*  NEUTROABS 7.8*  --   --   --   HGB 7.2*  < > 9.6* 10.6*  HCT 21.0*  < > 27.6* 31.2*  MCV 85.7  < > 84.9 86.4  PLT 301  < > 285 336  < > = values in this interval not displayed. Coagulation:  Recent Labs Lab 09/18/15 0450 09/19/15 0520 09/20/15 0358 09/21/15 0552  LABPROT 16.3* 16.6* 15.6* 15.4*  INR 1.30 1.33 1.22 1.20   Urine Drug Screen: Drugs of Abuse     Component Value Date/Time   LABOPIA * 06/23/2009 1831    POSITIVE (NOTE) Result repeated and verified. Sent for confirmatory testing   COCAINSCRNUR NEGATIVE 06/23/2009 1831   LABBENZ NEGATIVE 06/23/2009 1831   AMPHETMU NEGATIVE 06/23/2009 1831    Micro Results: No results found for this or any previous visit (  from the past 240 hour(s)). Studies/Results: No results found. Medications: I have reviewed the patient's current medications. Scheduled Meds: . sodium chloride   Intravenous Once  . antiseptic oral rinse  7 mL Mouth Rinse q12n4p  . atazanavir-cobicistat  1 tablet Oral Daily  . chlorhexidine  15 mL Mouth Rinse BID  . dolutegravir  50 mg Oral Daily  . enoxaparin (LOVENOX) injection  40 mg Subcutaneous Q24H  . feeding supplement (ENSURE ENLIVE)  237 mL Oral QID  . insulin aspart  0-9 Units Subcutaneous 4 times per day  . multivitamin with minerals  1 tablet Oral Daily  . nutrition supplement (JUVEN)  1 packet Oral BID WC  . [START ON 09/22/2015] pantoprazole  40 mg Oral Daily  . phosphorus  750 mg Oral BID  . potassium chloride  20 mEq Oral BID  . potassium citrate  40 mEq Oral BID WC  . sodium bicarbonate  1,300 mg Oral TID  . tenofovir  300 mg Oral Daily   Continuous Infusions:   PRN Meds:.acetaminophen, oxyCODONE, sodium chloride Assessment/Plan: Active Problems:   Human immunodeficiency virus (HIV) disease (HCC)    Alcohol abuse   Status post above knee amputation ([place])   Long term current use of anticoagulant   Avascular necrosis of femoral head (HCC)   Septic shock (HCC)   Gram-negative bacteremia (HCC)   Abdominal pain   Pneumonia due to Klebsiella pneumoniae (HCC)   Pressure ulcer   Protein-calorie malnutrition, severe   SBO (small bowel obstruction) (HCC)   Hyperosmolality and/or hypernatremia   Hypomagnesemia   Hypophosphatemia  Septic shock 2/2 to Klebsiella pneumoniae Bacteremia likely from GI (aspiration) vs lung: Resolved.  Was on pressors at [facility]. Imaging shows bilateral pneumonia has improved since 11/8. Afebrile currently. Blood culture showing no growth in 5 days. Satting 98-100% on RA. He was treated with antibiotics for 10 days, initially started at the other hospital on admission there on November 7. Unasyn was discontinued on 11/17.   Partial SBO: Resolved. Abdominal X-ray showing partial SBO, improved since 11/8. Patient was previously on NGT which he pulled out a few nights ago. He was previously given an enema and abdominal exam improved after that- no abdominal tenderness or distention and +BS on exam today. He is tolerating PO intake (soft diet) well and having regular bowel movements.   - Hydromorphone 1 mg q4h prn - Soft (dysphagia 3) diet   Hematemesis: Unclear source of upper GI bleed. Hgb 8.9 on 11/15, with baseline over 13. Likely in the setting of elevated INR during previous hospitalization. INR 1.22 today. However, Hgb dropped to 6.8 on 11/20 and patient was transfused with 2 U PRBCs. Low hgb could partially be due to hemodilution because patient has been receiving a lot of fluids to correct his electrolyte abnormalities. Hgb stable at 10.6 today.  - Protonix IV 40 mg - Monitor H/H  GB dysfunction: cholestasis, EF 27% per HIDA.  - GI on board, appreciate recs  High INR without warfarin - with History of Unprovoked DVT:  Patient had consistently been  supratherapeutic at anti-coag visits and presented to 10 at [facility]. We held Coumadin for this reason. High INR has resolved now, 1.2 today.  - Lovenox per pharmacy  - F/u daily cbc and INR  Acute on Chronic Kidney Disease - likely 2/2 to ATN: Baseline creatinine of ~1.2. Was in 7's at outside hospital, SCr 0.9 today.   - continue to monitor.  - F/u daily BMP  Non gap Metabolic Acidosis: AG is resolved as no longer has lactic acidosis but persistently has low bicarb (12 today).  - appreciate nephro recs.  - cont oral sodium bicarbonate 1300 mg TID.  Hypokalemia and hypoMag - K low (3.5) this morning. Mag 1.8. -cont oral potassium supplementation - Magnesium repleted  - Urocit K 56mq bid until K replete then d/c.  - Renal function panel q12 hrs   Hypophosphatemia - Phosphorous 2.8 today. Low phosphorous likely in the setting of severe malnutrition and chronic ETOH use.  - Kphos tabs 1500 mg BID and replete - Once K, Mg stable, stop those and wait 72 h and look at urine studies.  Hypernatremia - likely 2/2 to free water deficit Received Normal Saline extensively before [place] improved to 140 today.  -Discontinue D5  High urine output: Dose of DDAVP given on 11/16 for possible diabetes insipidus. Urine output 5.1 L in the past 24 hrs. His urine OSM are not compatible with DI incomplete or complete. May have some incomplete DI with post ATN diuresis, and low Mg/K which prohibit concentrating.  -Will hold DDAVP and allow SNa to rise and use po DDAVP if needed.Would restudy U Osm if off DDAVP 72 hrs.  -F/u urine osm, NA -F/u UA -Appreciate nephrology recs    HIV: CD4 decreased from 700 to 219 in a matter of 2 weeks, likely in the setting of acute illness. Viral load <20 in 07/2015.    -Restart anti-retroviral regimen when PO intake improves/ patient more cooperative with oral meds  -F/u HLA B-57 (01)  Elevated AST: To 62. Likely downtrending from previous septic  shock, but could represent underlying liver dysfunction driving up his INR. AST normal now.     Stage II pressure ulcer -Foam dressing as recommended by wound care   Avascular Necrosis of Hip and Phantom Limb Pain: Pain management per above.   DVT Prophylaxis: Lovenox 40 mg daily   Code Status: Full  Dispo: Disposition is deferred at this time, awaiting improvement of current medical problems.  Anticipated discharge in approximately 2-3 day(s).   The patient does have a current PCP (Iline Oven MD) and [facility].  The patient does not have transportation limitations that hinder transportation to clinic appointments.  .Services Needed at time of discharge: Y = Yes, Blank = No PT:   OT:   RN:   Equipment:   Other:     LOS: 11 days   VShela Leff MD 09/21/2015, 11:44 AM

## 2015-09-21 NOTE — NC FL2 (Signed)
[place] LEVEL OF CARE SCREENING TOOL     IDENTIFICATION  Patient Name: [name] Birthdate: Sep 30, 1961 Sex: male Admission Date (Current Location): 09/10/2015  [place] and [place] Number: Kathleen Argue [id number] Facility and Address:  [facility] [place], [place] [address] [place], [place] [address]      Provider Number: [id number]  Attending Physician Name and Address:  Annia Belt, MD  Relative Name and Phone Number:   Maxence [place]rairie [phone]))    Current Level of Care: Hospital Recommended Level of Care: [place] Prior Approval Number:    Date Approved/Denied:   PASRR Number:  [id number]  Discharge Plan: SNF    Current Diagnoses: Patient Active Problem List   Diagnosis Date Noted  . Hypophosphatemia   . SBO (small bowel obstruction) ([place])   . Hyperosmolality and/or hypernatremia   . Hypomagnesemia   . Pressure ulcer 09/13/2015  . Protein-calorie malnutrition, severe 09/13/2015  . Abdominal pain   . Pneumonia due to Klebsiella pneumoniae ([place])   . Septic shock ([place]) 09/10/2015  . Gram-negative bacteremia ([place]) 09/10/2015  . Polyuria 08/06/2015  . Prediabetes 08/06/2015  . Hematuria 08/06/2015  . UTI (urinary tract infection) 05/14/2015  . Avascular necrosis of femoral head ([place]) 05/14/2015  . Left adrenal mass ([place]) 05/14/2015  . Hypokalemia 10/08/2014  . CKD (chronic kidney disease) 02/10/2014  . Hyperglycemia 12/29/2013  . Hyperbilirubinemia 12/29/2013  . Unintentional weight loss 11/04/2013  . Left hip pain 03/09/2013  . Preventative health care 03/09/2013  . Phantom limb syndrome with pain ([place]) 03/21/2012  . Long term current use of anticoagulant 11/20/2010  . ERECTILE DYSFUNCTION, ORGANIC 04/12/2010  . GERD 08/23/2009  . METHICILLIN RESISTANT STAPHYLOCOCCUS AUREUS INFECTION 08/05/2009  . ENLARGEMENT OF LYMPH NODES 07/20/2009  . Embolism and thrombosis of artery ([place]) 07/19/2009  .  HYPOGONADISM 07/15/2009  . DEFICIENCY OF OTHER VITAMINS 07/15/2009  . PULMONARY NODULE 07/15/2009  . Status post above knee amputation ([place]) 07/15/2009  . Human immunodeficiency virus (HIV) disease ([place]) 11/10/2006  . SHINGLES, RECURRENT 11/10/2006  . HEPATITIS B 11/10/2006  . DYSLIPIDEMIA 11/10/2006  . PSYCHOSIS 11/10/2006  . Alcohol abuse 11/10/2006  . CIGARETTE SMOKER 11/10/2006  . PERIPHERAL NEUROPATHY 11/10/2006  . ALLERGIC RHINITIS 11/10/2006  . DENTAL CARIES 11/10/2006    Orientation ACTIVITIES/SOCIAL BLADDER RESPIRATION    Self, Place, Situation  Passive Continent Normal  BEHAVIORAL SYMPTOMS/MOOD NEUROLOGICAL BOWEL NUTRITION STATUS      Continent Diet (diet dys )  PHYSICIAN VISITS COMMUNICATION OF NEEDS Height & Weight Skin  30 days Verbally 6' (182.9 cm) 90 lbs. Normal          AMBULATORY STATUS RESPIRATION    Supervision limited Normal      Personal Care Assistance Level of Assistance  Bathing, Feeding, Dressing Bathing Assistance: Limited assistance Feeding assistance: Independent Dressing Assistance: Limited assistance      Functional Limitations Info  Sight, Hearing, Speech Sight Info: Adequate Hearing Info: Adequate Speech Info: Adequate       SPECIAL CARE FACTORS FREQUENCY  PT (By licensed PT)     PT Frequency: 5x             Additional Factors Info  Code Status, Allergies Code Status Info: full Allergies Info: dapsone,            Current Medications (09/21/2015): Current Facility-Administered Medications  Medication Dose Route Frequency Provider Last Rate Last Dose  . 0.9 %  sodium chloride infusion   Intravenous Once Dellia Nims, MD      .  acetaminophen (TYLENOL) tablet 500 mg  500 mg Oral Q4H PRN Tasrif Ahmed, MD   500 mg at 09/19/15 1452  . antiseptic oral rinse (CPC / CETYLPYRIDINIUM CHLORIDE 0.05%) solution 7 mL  7 mL Mouth Rinse q12n4p Campbell Riches, MD   7 mL at 09/20/15 1600  . chlorhexidine (PERIDEX) 0.12 % solution  15 mL  15 mL Mouth Rinse BID Campbell Riches, MD   15 mL at 09/20/15 1057  . enoxaparin (LOVENOX) injection 40 mg  40 mg Subcutaneous Q24H Annia Belt, MD   40 mg at 09/17/15 0910  . feeding supplement (ENSURE ENLIVE) (ENSURE ENLIVE) liquid 237 mL  237 mL Oral QID Reanne J Barbato, RD   237 mL at 09/20/15 2313  . insulin aspart (novoLOG) injection 0-9 Units  0-9 Units Subcutaneous 4 times per day Eudelia Bunch, RPH   2 Units at 09/20/15 0000  . multivitamin with minerals tablet 1 tablet  1 tablet Oral Daily Lorenda Peck, RD   1 tablet at 09/20/15 1725  . nutrition supplement (JUVEN) (JUVEN) powder packet 1 packet  1 packet Oral BID WC Annia Belt, MD      . oxyCODONE (Oxy IR/ROXICODONE) immediate release tablet 5 mg  5 mg Oral Q6H PRN Shela Leff, MD   5 mg at 09/20/15 1445  . pantoprazole (PROTONIX) injection 40 mg  40 mg Intravenous Q24H Francesca Oman, DO   40 mg at 09/20/15 1057  . phosphorus (K PHOS NEUTRAL) tablet 750 mg  750 mg Oral BID Annia Belt, MD      . potassium chloride SA (K-DUR,KLOR-CON) CR tablet 20 mEq  20 mEq Oral BID Shela Leff, MD      . potassium citrate (UROCIT-K) SR tablet 40 mEq  40 mEq Oral BID WC Annia Belt, MD      . sodium bicarbonate tablet 1,300 mg  1,300 mg Oral TID Shela Leff, MD   1,300 mg at 09/20/15 2314  . sodium chloride 0.9 % injection 10-40 mL  10-40 mL Intracatheter PRN Campbell Riches, MD   10 mL at 09/21/15 [id number]   Do not use this list as official medication orders. Please verify with discharge summary.  Discharge Medications:   Medication List    ASK your doctor about these medications        aspirin 81 MG tablet  Take 81 mg by mouth daily.     ENSURE  Take 1 Can by mouth 3 (three) times daily between meals.     EVOTAZ 300-150 MG tablet  Generic drug:  atazanavir-cobicistat  TAKE 1 TABLET BY MOUTH DAILY. SWALLOW WHOLE. DO NOT CRUSH, CUT OR CHEW TABLET. TAKE WITH FOOD.      fenofibrate 145 MG tablet  Commonly known as:  TRICOR  Take 1 tablet (145 mg total) by mouth daily.     gabapentin 400 MG capsule  Commonly known as:  NEURONTIN  TAKE ONE CAPSULE BY MOUTH FOUR TIMES DAILY     morphine 60 MG 12 hr tablet  Commonly known as:  MS CONTIN  Take 2 tablets (120 mg total) by mouth every 12 (twelve) hours.     oxyCODONE 5 MG immediate release tablet  Commonly known as:  Oxy IR/ROXICODONE  Take 1 tablet (5 mg total) by mouth every 6 (six) hours as needed for severe pain.     potassium chloride SA 20 MEQ tablet  Commonly known as:  K-DUR,KLOR-CON  TAKE 1 TABLET BY MOUTH  EVERY DAY     [name] 50 MG tablet  Generic drug:  dolutegravir  TAKE 1 TABLET (50 MG TOTAL) BY MOUTH DAILY.     [name] 300 MG tablet  Generic drug:  tenofovir  TAKE 1 TABLET (300 MG TOTAL) BY MOUTH DAILY.     warfarin 5 MG tablet  Commonly known as:  COUMADIN  Take 2.5-5 mg by mouth daily. On Sunday Monday Wednesday Friday patient takes 2.5 mg and on all other days patient takes 5 mg.        Relevant Imaging Results:  Relevant Lab Results:  Recent Labs    Additional Information SS# [id number]  Minta Balsam  BSW intern  [phone]

## 2015-09-21 NOTE — Clinical Social Work Placement (Signed)
   CLINICAL SOCIAL WORK PLACEMENT  NOTE  Date:  09/21/2015  Patient Details  Name: Zylin Kuhnke MRN: [id number] Date of Birth: November 03, 1960  Clinical Social Work is seeking post-discharge placement for this patient at the [place] level of care (*CSW will initial, date and re-position this form in  chart as items are completed):  Yes   Patient/family provided with [place] Work Department's list of facilities offering this level of care within the geographic area requested by the patient (or if unable, by the patient's family).  Yes   Patient/family informed of their freedom to choose among providers that offer the needed level of care, that participate in Medicare, Medicaid or managed care program needed by the patient, have an available bed and are willing to accept the patient.  Yes   Patient/family informed of [place]'s ownership interest in [facility] and [facility], as well as of the fact that they are under no obligation to receive care at these facilities.  PASRR submitted to EDS on 09/21/15     PASRR number received on 09/21/15     Existing PASRR number confirmed on       FL2 transmitted to all facilities in geographic area requested by pt/family on       FL2 transmitted to all facilities within larger geographic area on 09/21/15     Patient informed that his/her managed care company has contracts with or will negotiate with certain facilities, including the following:            Patient/family informed of bed offers received.  Patient chooses bed at       Physician recommends and patient chooses bed at      Patient to be transferred to   on  .  Patient to be transferred to facility by       Patient family notified on   of transfer.  Name of family member notified:        PHYSICIAN Please sign FL2     Additional Comment:   CIR might accept, hopefully will be admitted when stable. SNF is second option   _______________________________________________   [place] intern  [phone]

## 2015-09-21 NOTE — Clinical Social Work Note (Signed)
Clinical Social Work Assessment  Patient Details  Name: [name] MRN: [id number] Date of Birth: [birth year]  Date of referral:  09/21/15               Reason for consult:  Facility Placement                Permission sought to share information with:  Family Supports Permission granted to share information::  Yes, Verbal Permission Granted  Name::        Agency::     Relationship::     Contact Information:     Housing/Transportation Living arrangements for the past 2 months:  Apartment Source of Information:  Adult Children Erline Levine) Patient Interpreter Needed:  None Criminal Activity/Legal Involvement Pertinent to Current Situation/Hospitalization:  No - Comment as needed Significant Relationships:  Adult Children Lives with:  Adult Children Do you feel safe going back to the place where you live?  Yes Need for family participation in patient care:  Yes (Comment)  Care giving concerns:  Pt lives alone. Does not have that 24 hour assistance at home. PT recommend CIR.   Social Worker assessment / plan:  Due to patient confusion, BSW intern could not do assessment with patient. BSW intern did a telephonic assessment with the daughter Erline Levine over the phone. BSW intern notified Erline Levine about PT recommendation which was for inpatient rehab. She is agreeable to that and stated her father would love that as well. Patient daughter did state that she is agreeable to SNF as well as long as it is in the [place] area. BSW intern informed her on the referral process to SNFs.  Employment status:  Disabled (Comment on whether or not currently receiving Disability) Insurance information:  Medicare PT Recommendations:  Inpatient Rehab Consult Information / Referral to community resources:  Acute Rehab  Patient/Family's Response to care: Patient daughter agreeable with current plan. Patient daughter  feel confident that patient will progress with therapies at [facility] and will be able to transition  back home independently.   Patient/Family's Understanding of and Emotional Response to Diagnosis, Current Treatment, and Prognosis:  Patient daughter understanding of patient current condition.   Emotional Assessment Appearance:  Appears stated age Attitude/Demeanor/Rapport:  Unable to Assess (due to confusion) Affect (typically observed):  Unable to Assess (due to confusion) Orientation:  Oriented to Self Alcohol / Substance use:  Never Used Psych involvement (Current and /or in the community):  No (Comment)  Discharge Needs  Concerns to be addressed:  No discharge needs identified Readmission within the last 30 days:  No Current discharge risk:  None Barriers to Discharge:  No Barriers Identified   [place] intern  [phone]

## 2015-09-21 NOTE — Progress Notes (Signed)
Speech Language Pathology Treatment: Dysphagia  Patient Details Name: [name] MRN: [id number] DOB: 13-Mar-1961 Today's Date: 09/21/2015 Time: 2608-8835 SLP Time Calculation (min) (ACUTE ONLY): 13 min  Assessment / Plan / Recommendation Clinical Impression  ST treatment for dysphagia during observation with Dys 3 texture and thin liquids without s/s aspiration (limited due to just completed breakfast). Encouraged pt to consume as much as able for healing. Continue Dys 3, thin due to edentulous. No further ST needed.     HPI HPI: [name] is a 54 y.o. 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 also reported alcoholism. Presented in transfer from [facility] 09/10/2015 with dark tarry stools and hematemesis. INR greater than 10 and reversed. Found to be acidotic, hypernatremic/hypokalemic required intubation. Chest x-ray bilateral pneumonia question aspiration. Placed on broad-spectrum antibiotics. He was transfused. Blood cultures positive for Klebsiella. KUB of the abdomen showed partial small bowel obstruction requiring nasogastric tube. Has since resumed mechanical soft and thin liquid diet. Pt with a hx of agitation and physical and verbal aggression. Most recently RN reports refusal of medicines and breakfast meal.        SLP Plan  All goals met;Discharge SLP treatment due to (comment)     Recommendations  Diet recommendations: Dysphagia 3 (mechanical soft);Thin liquid Liquids provided via: Straw;Cup Medication Administration: Whole meds with liquid Supervision: Patient able to self feed Compensations: Slow rate;Small sips/bites Postural Changes and/or Swallow Maneuvers: Seated upright 90 degrees              Oral Care Recommendations: Oral care BID Follow up Recommendations: None Plan: All goals met;Discharge SLP treatment due to  (comment)   Houston Siren 09/21/2015, 8:17 AM  Orbie Pyo Colvin Caroli.Ed [employer] [phone]

## 2015-09-22 ENCOUNTER — Other Ambulatory Visit: Payer: Self-pay | Admitting: Internal Medicine

## 2015-09-22 ENCOUNTER — Inpatient Hospital Stay (HOSPITAL_COMMUNITY)
Admission: RE | Admit: 2015-09-22 | Discharge: 2015-09-29 | DRG: 977 | Disposition: A | Discharge status: Home Health | Payer: Medicare Other | Source: Intra-hospital | Attending: Physical Medicine & Rehabilitation | Admitting: Physical Medicine & Rehabilitation

## 2015-09-22 DIAGNOSIS — Z86718 Personal history of other venous thrombosis and embolism: Secondary | ICD-10-CM | POA: Diagnosis not present

## 2015-09-22 DIAGNOSIS — Z89611 Acquired absence of right leg above knee: Secondary | ICD-10-CM | POA: Diagnosis not present

## 2015-09-22 DIAGNOSIS — B20 Human immunodeficiency virus [HIV] disease: Secondary | ICD-10-CM | POA: Diagnosis not present

## 2015-09-22 DIAGNOSIS — Z79891 Long term (current) use of opiate analgesic: Secondary | ICD-10-CM | POA: Diagnosis not present

## 2015-09-22 DIAGNOSIS — F17210 Nicotine dependence, cigarettes, uncomplicated: Secondary | ICD-10-CM

## 2015-09-22 DIAGNOSIS — D649 Anemia, unspecified: Secondary | ICD-10-CM

## 2015-09-22 DIAGNOSIS — G8929 Other chronic pain: Secondary | ICD-10-CM

## 2015-09-22 DIAGNOSIS — N179 Acute kidney failure, unspecified: Secondary | ICD-10-CM | POA: Diagnosis not present

## 2015-09-22 DIAGNOSIS — Z79899 Other long term (current) drug therapy: Secondary | ICD-10-CM | POA: Diagnosis not present

## 2015-09-22 DIAGNOSIS — N170 Acute kidney failure with tubular necrosis: Secondary | ICD-10-CM

## 2015-09-22 DIAGNOSIS — L89152 Pressure ulcer of sacral region, stage 2: Secondary | ICD-10-CM

## 2015-09-22 DIAGNOSIS — R7881 Bacteremia: Secondary | ICD-10-CM | POA: Diagnosis not present

## 2015-09-22 DIAGNOSIS — N189 Chronic kidney disease, unspecified: Secondary | ICD-10-CM

## 2015-09-22 DIAGNOSIS — Z89619 Acquired absence of unspecified leg above knee: Secondary | ICD-10-CM | POA: Diagnosis not present

## 2015-09-22 DIAGNOSIS — R64 Cachexia: Secondary | ICD-10-CM | POA: Diagnosis not present

## 2015-09-22 DIAGNOSIS — E876 Hypokalemia: Secondary | ICD-10-CM | POA: Diagnosis not present

## 2015-09-22 DIAGNOSIS — F1010 Alcohol abuse, uncomplicated: Secondary | ICD-10-CM

## 2015-09-22 DIAGNOSIS — N2589 Other disorders resulting from impaired renal tubular function: Secondary | ICD-10-CM | POA: Insufficient documentation

## 2015-09-22 DIAGNOSIS — D62 Acute posthemorrhagic anemia: Secondary | ICD-10-CM | POA: Diagnosis not present

## 2015-09-22 DIAGNOSIS — Z89612 Acquired absence of left leg above knee: Secondary | ICD-10-CM | POA: Diagnosis not present

## 2015-09-22 DIAGNOSIS — G4700 Insomnia, unspecified: Secondary | ICD-10-CM

## 2015-09-22 DIAGNOSIS — B961 Klebsiella pneumoniae [K. pneumoniae] as the cause of diseases classified elsewhere: Secondary | ICD-10-CM

## 2015-09-22 DIAGNOSIS — Z7901 Long term (current) use of anticoagulants: Secondary | ICD-10-CM | POA: Diagnosis not present

## 2015-09-22 DIAGNOSIS — E871 Hypo-osmolality and hyponatremia: Secondary | ICD-10-CM | POA: Diagnosis not present

## 2015-09-22 DIAGNOSIS — K5660 Unspecified intestinal obstruction: Secondary | ICD-10-CM

## 2015-09-22 DIAGNOSIS — R5381 Other malaise: Secondary | ICD-10-CM | POA: Diagnosis not present

## 2015-09-22 DIAGNOSIS — R4182 Altered mental status, unspecified: Secondary | ICD-10-CM | POA: Diagnosis not present

## 2015-09-22 DIAGNOSIS — K920 Hematemesis: Secondary | ICD-10-CM

## 2015-09-22 DIAGNOSIS — L8990 Pressure ulcer of unspecified site, unspecified stage: Secondary | ICD-10-CM

## 2015-09-22 DIAGNOSIS — E785 Hyperlipidemia, unspecified: Secondary | ICD-10-CM | POA: Diagnosis not present

## 2015-09-22 DIAGNOSIS — Z7982 Long term (current) use of aspirin: Secondary | ICD-10-CM | POA: Diagnosis not present

## 2015-09-22 LAB — RENAL FUNCTION PANEL
Albumin: 2.9 g/dL — ABNORMAL LOW (ref 3.5–5.0)
Anion gap: 6 (ref 5–15)
BUN: 7 mg/dL (ref 6–20)
CHLORIDE: 119 mmol/L — AB (ref 101–111)
CO2: 12 mmol/L — AB (ref 22–32)
CREATININE: 0.96 mg/dL (ref 0.61–1.24)
Calcium: 8.2 mg/dL — ABNORMAL LOW (ref 8.9–10.3)
Glucose, Bld: 107 mg/dL — ABNORMAL HIGH (ref 65–99)
POTASSIUM: 3.8 mmol/L (ref 3.5–5.1)
Phosphorus: 2.4 mg/dL — ABNORMAL LOW (ref 2.5–4.6)
Sodium: 137 mmol/L (ref 135–145)

## 2015-09-22 LAB — CBC
HCT: 29 % — ABNORMAL LOW (ref 39.0–52.0)
Hemoglobin: 9.6 g/dL — ABNORMAL LOW (ref 13.0–17.0)
MCH: 29.1 pg (ref 26.0–34.0)
MCHC: 33.1 g/dL (ref 30.0–36.0)
MCV: 87.9 fL (ref 78.0–100.0)
PLATELETS: 328 10*3/uL (ref 150–400)
RBC: 3.3 MIL/uL — AB (ref 4.22–5.81)
RDW: 18.7 % — AB (ref 11.5–15.5)
WBC: 11.4 10*3/uL — AB (ref 4.0–10.5)

## 2015-09-22 LAB — MAGNESIUM: MAGNESIUM: 1.7 mg/dL (ref 1.7–2.4)

## 2015-09-22 LAB — GLUCOSE, CAPILLARY
GLUCOSE-CAPILLARY: 73 mg/dL (ref 65–99)
GLUCOSE-CAPILLARY: 143 mg/dL — AB (ref 65–99)
Glucose-Capillary: 133 mg/dL — ABNORMAL HIGH (ref 65–99)
Glucose-Capillary: 155 mg/dL — ABNORMAL HIGH (ref 65–99)

## 2015-09-22 LAB — PROTIME-INR
INR: 1.15 (ref 0.00–1.49)
Prothrombin Time: 14.9 seconds (ref 11.6–15.2)

## 2015-09-22 LAB — TSH: TSH: 1.124 u[IU]/mL (ref 0.350–4.500)

## 2015-09-22 MED ORDER — OXYCODONE HCL 5 MG PO TABS
5.0000 mg | ORAL_TABLET | Freq: Four times a day (QID) | ORAL | Status: DC | PRN
  Administered 2015-09-22 – 2015-09-28 (×10): 5 mg via ORAL
  Filled 2015-09-22 (×11): qty 1

## 2015-09-22 MED ORDER — SORBITOL 70 % SOLN: 30.0000 mL | Freq: Every day | Status: DC | PRN

## 2015-09-22 MED ORDER — ONDANSETRON HCL 4 MG/2ML IJ SOLN: 4.0000 mg | Freq: Four times a day (QID) | INTRAMUSCULAR | Status: DC | PRN

## 2015-09-22 MED ORDER — ADULT MULTIVITAMIN W/MINERALS CH
1.0000 | ORAL_TABLET | Freq: Every day | ORAL | Status: DC
  Administered 2015-09-23 – 2015-09-28 (×6): 1 via ORAL
  Filled 2015-09-22 (×6): qty 1

## 2015-09-22 MED ORDER — JUVEN PO PACK: 1.0000 | PACK | Freq: Two times a day (BID) | ORAL | Status: DC

## 2015-09-22 MED ORDER — POTASSIUM CITRATE ER 10 MEQ (1080 MG) PO TBCR: 40.0000 meq | EXTENDED_RELEASE_TABLET | Freq: Two times a day (BID) | ORAL | Status: DC

## 2015-09-22 MED ORDER — ACETAMINOPHEN 500 MG PO TABS: 500.0000 mg | ORAL_TABLET | ORAL | Status: DC | PRN

## 2015-09-22 MED ORDER — ACETAMINOPHEN 500 MG PO TABS
500.0000 mg | ORAL_TABLET | ORAL | Status: DC | PRN
  Administered 2015-09-23 – 2015-09-26 (×3): 500 mg via ORAL
  Filled 2015-09-22 (×5): qty 1

## 2015-09-22 MED ORDER — APIXABAN 5 MG PO TABS: 10.0000 mg | ORAL_TABLET | Freq: Two times a day (BID) | ORAL | Status: DC

## 2015-09-22 MED ORDER — DARUNAVIR-COBICISTAT 800-150 MG PO TABS
1.0000 | ORAL_TABLET | Freq: Every day | ORAL | Status: DC
  Administered 2015-09-23 – 2015-09-28 (×6): 1 via ORAL
  Filled 2015-09-22 (×8): qty 1

## 2015-09-22 MED ORDER — TENOFOVIR DISOPROXIL FUMARATE 300 MG PO TABS
300.0000 mg | ORAL_TABLET | Freq: Every day | ORAL | Status: DC
  Administered 2015-09-23 – 2015-09-28 (×6): 300 mg via ORAL
  Filled 2015-09-22 (×8): qty 1

## 2015-09-22 MED ORDER — MAGNESIUM CHLORIDE 64 MG PO TBEC: 1.0000 | DELAYED_RELEASE_TABLET | Freq: Every day | ORAL | Status: DC

## 2015-09-22 MED ORDER — WARFARIN - PHYSICIAN DOSING INPATIENT: Freq: Every day | Status: DC

## 2015-09-22 MED ORDER — MAGNESIUM CHLORIDE 64 MG PO TBEC
1.0000 | DELAYED_RELEASE_TABLET | Freq: Every day | ORAL | Status: DC
  Administered 2015-09-23 – 2015-09-28 (×6): 64 mg via ORAL
  Filled 2015-09-22 (×8): qty 1

## 2015-09-22 MED ORDER — PANTOPRAZOLE SODIUM 40 MG PO TBEC: 40.0000 mg | DELAYED_RELEASE_TABLET | Freq: Every day | ORAL | Status: DC

## 2015-09-22 MED ORDER — K PHOS MONO-SOD PHOS DI & MONO 155-852-130 MG PO TABS
750.0000 mg | ORAL_TABLET | Freq: Two times a day (BID) | ORAL | Status: DC
  Administered 2015-09-22 – 2015-09-28 (×11): 750 mg via ORAL
  Filled 2015-09-22 (×16): qty 3

## 2015-09-22 MED ORDER — POTASSIUM CITRATE ER 10 MEQ (1080 MG) PO TBCR
40.0000 meq | EXTENDED_RELEASE_TABLET | Freq: Two times a day (BID) | ORAL | Status: DC
  Administered 2015-09-23 – 2015-09-28 (×12): 40 meq via ORAL
  Filled 2015-09-22 (×19): qty 4

## 2015-09-22 MED ORDER — K PHOS MONO-SOD PHOS DI & MONO 155-852-130 MG PO TABS: 750.0000 mg | ORAL_TABLET | Freq: Two times a day (BID) | ORAL | Status: DC

## 2015-09-22 MED ORDER — MAGNESIUM CHLORIDE 64 MG PO TBEC
1.0000 | DELAYED_RELEASE_TABLET | Freq: Every day | ORAL | Status: DC
  Administered 2015-09-22: 64 mg via ORAL
  Filled 2015-09-22: qty 1

## 2015-09-22 MED ORDER — ONDANSETRON HCL 4 MG PO TABS: 4.0000 mg | ORAL_TABLET | Freq: Four times a day (QID) | ORAL | Status: DC | PRN

## 2015-09-22 MED ORDER — POTASSIUM CHLORIDE CRYS ER 20 MEQ PO TBCR
20.0000 meq | EXTENDED_RELEASE_TABLET | Freq: Two times a day (BID) | ORAL | Status: DC
  Administered 2015-09-22 – 2015-09-28 (×12): 20 meq via ORAL
  Filled 2015-09-22 (×13): qty 1

## 2015-09-22 MED ORDER — WARFARIN SODIUM 3 MG PO TABS
3.0000 mg | ORAL_TABLET | Freq: Every day | ORAL | Status: DC
  Administered 2015-09-22: 3 mg via ORAL
  Filled 2015-09-22: qty 1

## 2015-09-22 MED ORDER — ADULT MULTIVITAMIN W/MINERALS CH: 1.0000 | ORAL_TABLET | Freq: Every day | ORAL | Status: DC

## 2015-09-22 MED ORDER — DARUNAVIR-COBICISTAT 800-150 MG PO TABS: 1.0000 | ORAL_TABLET | Freq: Every day | ORAL | Status: DC

## 2015-09-22 MED ORDER — SODIUM BICARBONATE 650 MG PO TABS
1300.0000 mg | ORAL_TABLET | Freq: Three times a day (TID) | ORAL | Status: DC
  Administered 2015-09-22 – 2015-09-28 (×18): 1300 mg via ORAL
  Filled 2015-09-22 (×23): qty 2

## 2015-09-22 MED ORDER — APIXABAN 5 MG PO TABS: 5.0000 mg | ORAL_TABLET | Freq: Two times a day (BID) | ORAL | Status: DC

## 2015-09-22 MED ORDER — ENSURE ENLIVE PO LIQD
237.0000 mL | Freq: Four times a day (QID) | ORAL | Status: DC
  Administered 2015-09-22 – 2015-09-28 (×25): 237 mL via ORAL

## 2015-09-22 MED ORDER — TENOFOVIR DISOPROXIL FUMARATE 300 MG PO TABS
300.0000 mg | ORAL_TABLET | Freq: Every day | ORAL | Status: DC
  Administered 2015-09-22: 300 mg via ORAL
  Filled 2015-09-22: qty 1

## 2015-09-22 MED ORDER — ENOXAPARIN SODIUM 40 MG/0.4ML ~~LOC~~ SOLN
40.0000 mg | SUBCUTANEOUS | Status: DC
  Administered 2015-09-22 – 2015-09-23 (×2): 40 mg via SUBCUTANEOUS
  Filled 2015-09-22 (×2): qty 0.4

## 2015-09-22 MED ORDER — SODIUM BICARBONATE 650 MG PO TABS: 1300.0000 mg | ORAL_TABLET | Freq: Three times a day (TID) | ORAL | Status: DC

## 2015-09-22 MED ORDER — JUVEN PO PACK
1.0000 | PACK | Freq: Two times a day (BID) | ORAL | Status: DC
  Administered 2015-09-22 – 2015-09-28 (×12): 1 via ORAL
  Filled 2015-09-22 (×16): qty 1

## 2015-09-22 MED ORDER — DARUNAVIR-COBICISTAT 800-150 MG PO TABS
1.0000 | ORAL_TABLET | Freq: Every day | ORAL | Status: DC
  Administered 2015-09-22: 1 via ORAL
  Filled 2015-09-22: qty 1

## 2015-09-22 MED ORDER — PANTOPRAZOLE SODIUM 40 MG PO TBEC
40.0000 mg | DELAYED_RELEASE_TABLET | Freq: Every day | ORAL | Status: DC
  Administered 2015-09-23 – 2015-09-28 (×6): 40 mg via ORAL
  Filled 2015-09-22 (×6): qty 1

## 2015-09-22 MED ORDER — DOLUTEGRAVIR SODIUM 50 MG PO TABS
50.0000 mg | ORAL_TABLET | Freq: Every day | ORAL | Status: DC
  Administered 2015-09-22: 50 mg via ORAL

## 2015-09-22 MED ORDER — WARFARIN SODIUM 3 MG PO TABS: 3.0000 mg | ORAL_TABLET | Freq: Every day | ORAL | Status: DC

## 2015-09-22 MED ORDER — CETYLPYRIDINIUM CHLORIDE 0.05 % MT LIQD: 7.0000 mL | Freq: Two times a day (BID) | OROMUCOSAL | Status: DC

## 2015-09-22 NOTE — Progress Notes (Signed)
Ankit Lorie Phenix, MD Physician Addendum Physical Medicine and Rehabilitation Consult Note 09/17/2015 2:43 PM  Related encounter: Admission (Current) from 09/10/2015 in [facility]    Expand All Collapse All        Physical Medicine and Rehabilitation Consult Reason for Consult: Debility Referring Physician: Dr. Johnnye Sima   HPI: [name] is a 54 y.o. 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 Patient lives with his 82 year old daughter independent with assistive device prior to admission and prosthesis. Pt's daughter is planning on going to work and is in the process of trying to obtain some assistance for her father. One level apt with ramped entrance. Presented in transfer from [facility] 09/10/2015 with dark tarry stools and hematemesis. INR greater than 10 and reversed. Found to be acidotic, hypernatremic/hypokalemic required intubation. Chest x-ray bilateral pneumonia question aspiration. Placed on broad-spectrum antibiotics. He was transfused. Blood cultures positive for Klebsiella. KUB of the abdomen showed partial small bowel obstruction requiring nasogastric tube. Nephrology services follow-up for hypernatremia as well as hypokalemia with supplement added and latest sodium improved. Hemoglobin remained stable 7.7-8.4. Subcutaneous Lovenox for DVT prophylaxis. Remains on a full liquid diet. Wound care nurse follow-up for sacral wound with dressing changes as advised. Physical therapy evaluation completed and ongoing noting debilitation with recommendations of physical medicine rehabilitation consult.   Review of Systems  Constitutional: Positive for weight loss. Negative for fever and chills.  HENT: Positive for tinnitus.  Eyes: Negative for blurred vision and double vision.  Respiratory: Negative for cough and shortness of breath.  Cardiovascular: Negative for chest pain and palpitations.    Gastrointestinal: Positive for constipation. Negative for abdominal pain.  Genitourinary: Negative for dysuria and hematuria.  Musculoskeletal: Positive for myalgias and joint pain.  Skin: Negative for rash.  Neurological: Positive for weakness.  All other systems reviewed and are negative.  Past Medical History  Diagnosis Date  . History of chicken pox   . Hyperlipidemia   . History of DVT of lower extremity     right  . Left hip pain 03/09/2013  . Pneumonia   . Chronic kidney disease   . Protein malnutrition ([place]) 08/2015  . HIV (human immunodeficiency virus infection) [facility])    Past Surgical History  Procedure Laterality Date  . Appendectomy  1962  . Leg amputation above knee  2010    right leg   Family History  Problem Relation Age of Onset  . Arthritis Mother   . Kidney disease Maternal Grandfather    Social History:  reports that he has been smoking Cigarettes. He has a 18 pack-year smoking history. He has never used smokeless tobacco. He reports that he does not drink alcohol or use illicit drugs. Allergies:  Allergies  Allergen Reactions  . Dapsone     REACTION: fever  . Sulfamethoxazole-Trimethoprim     REACTION: fever   Medications Prior to Admission  Medication Sig Dispense Refill  . aspirin 81 MG tablet Take 81 mg by mouth daily.     Marland Kitchen ENSURE (ENSURE) Take 1 Can by mouth 3 (three) times daily between meals. 237 mL prn  . fenofibrate (TRICOR) 145 MG tablet Take 1 tablet (145 mg total) by mouth daily. 30 tablet 5  . gabapentin (NEURONTIN) 400 MG capsule TAKE ONE CAPSULE BY MOUTH FOUR TIMES DAILY 120 capsule 5  . morphine (MS CONTIN) 60 MG 12 hr tablet Take 2 tablets (120 mg total) by  mouth every 12 (twelve) hours. 120 tablet 0  . oxyCODONE (OXY IR/ROXICODONE) 5 MG immediate release tablet Take 1 tablet (5 mg total) by mouth every 6 (six) hours as needed  for severe pain. 60 tablet 0  . potassium chloride SA (K-DUR,KLOR-CON) 20 MEQ tablet TAKE 1 TABLET BY MOUTH EVERY DAY 30 tablet 3  . [name] 50 MG tablet TAKE 1 TABLET (50 MG TOTAL) BY MOUTH DAILY. 30 tablet 11  . [name] 300 MG tablet TAKE 1 TABLET (300 MG TOTAL) BY MOUTH DAILY. 30 tablet 11  . warfarin (COUMADIN) 5 MG tablet Take 2.5-5 mg by mouth daily. On Sunday Monday Wednesday Friday patient takes 2.5 mg and on all other days patient takes 5 mg.    . EVOTAZ 300-150 MG per tablet TAKE 1 TABLET BY MOUTH DAILY. SWALLOW WHOLE. DO NOT CRUSH, CUT OR CHEW TABLET. TAKE WITH FOOD. 30 tablet 11    Home: Home Living Family/patient expects to be discharged to:: Private residence Living Arrangements: Children (daughter) Available Help at Discharge: Family, Available 24 hours/day Type of Home: House Home Access: Stairs to enter, Ramped entrance Technical brewer of Steps: did not state Home Layout: One [place]: Environmental consultant - 2 wheels, Wheelchair - manual, Bedside commode Additional Comments: prosthesis  Functional History: Prior Function Level of Independence: Independent with assistive device(s) Functional Status:  Mobility: Bed Mobility Overal bed mobility: Needs Assistance Bed Mobility: Supine to Sit Supine to sit: Mod assist General bed mobility comments: already in the chair Transfers Overall transfer level: Needs assistance Equipment used: Rolling walker (2 wheeled) Transfers: Sit to/from Stand Sit to Stand: Min assist Stand pivot transfers: Min assist Squat pivot transfers: Mod assist General transfer comment: cues for hand placement. 3 standing trials. Ambulation/Gait Ambulation/Gait assistance: Min assist, Mod assist Ambulation Distance (Feet): 4 Feet (forward and back x2 (more difficulty backing up), feet forw) Assistive device: Rolling walker (2 wheeled) Gait Pattern/deviations: Step-to pattern General Gait Details: slow and  steady swing forward.    ADL:    Cognition: Cognition Overall Cognitive Status: Within Functional Limits for tasks assessed Orientation Level: Oriented X4 Cognition Arousal/Alertness: Lethargic, Awake/alert Behavior During Therapy: WFL for tasks assessed/performed Overall Cognitive Status: Within Functional Limits for tasks assessed  Blood pressure 111/64, pulse 98, temperature 98.7 F (37.1 C), temperature source Oral, resp. rate 18, height 6' (1.829 m), weight 42.1 kg (92 lb 13 oz), SpO2 100 %. Physical Exam  Vitals reviewed. Constitutional: No distress.  54 year old cachectic African-American male appearing older than stated age  HENT:  Head: Normocephalic and atraumatic.  Eyes: Conjunctivae and EOM are normal.  Neck: Normal range of motion. Neck supple. No thyromegaly present.  Cardiovascular: Normal rate and regular rhythm.  Respiratory: Effort normal and breath sounds normal.  GI: Soft. Bowel sounds are normal. He exhibits no distension.  Musculoskeletal: He exhibits no edema or tenderness.  Right AKA  Neurological: He is alert.  Mood is agitated.  He is oriented to person, place and age Sensation intact to light touch throughout Motor: 4-/5 throughout.  Skin: Skin is warm and dry.  AKA site is healed. Sacral ulcer not examined  Psychiatric: He is agitated. Cognition and memory are impaired.     Lab Results Last 24 Hours    Results for orders placed or performed during the hospital encounter of 09/10/15 (from the past 24 hour(s))  CBC Status: Abnormal   Collection Time: 09/16/15 3:23 PM  Result Value Ref Range   WBC 16.3 (H) 4.0 - 10.5 K/uL  RBC 2.71 (L) 4.22 - 5.81 MIL/uL   Hemoglobin 7.7 (L) 13.0 - 17.0 g/dL   HCT 23.3 (L) 39.0 - 52.0 %   MCV 86.0 78.0 - 100.0 fL   MCH 28.4 26.0 - 34.0 pg   MCHC 33.0 30.0 - 36.0 g/dL   RDW 17.8 (H) 11.5 - 15.5 %   Platelets 255 150 - 400 K/uL  Protime-INR Status:  Abnormal   Collection Time: 09/16/15 3:23 PM  Result Value Ref Range   Prothrombin Time 16.6 (H) 11.6 - 15.2 seconds   INR 1.33 0.00 - 1.49  Comprehensive metabolic panel Status: Abnormal   Collection Time: 09/16/15 3:23 PM  Result Value Ref Range   Sodium 144 135 - 145 mmol/L   Potassium 3.0 (L) 3.5 - 5.1 mmol/L   Chloride 127 (H) 101 - 111 mmol/L   CO2 11 (L) 22 - 32 mmol/L   Glucose, Bld 97 65 - 99 mg/dL   BUN 6 6 - 20 mg/dL   Creatinine, Ser 1.11 0.61 - 1.24 mg/dL   Calcium 7.4 (L) 8.9 - 10.3 mg/dL   Total Protein 5.2 (L) 6.5 - 8.1 g/dL   Albumin 2.4 (L) 3.5 - 5.0 g/dL   AST 40 15 - 41 U/L   ALT 24 17 - 63 U/L   Alkaline Phosphatase 86 38 - 126 U/L   Total Bilirubin 0.8 0.3 - 1.2 mg/dL   GFR calc non Af Amer >60 >60 mL/min   GFR calc Af Amer >60 >60 mL/min   Anion gap 6 5 - 15  Magnesium Status: Abnormal   Collection Time: 09/16/15 3:23 PM  Result Value Ref Range   Magnesium 1.4 (L) 1.7 - 2.4 mg/dL  Phosphorus Status: Abnormal   Collection Time: 09/16/15 3:23 PM  Result Value Ref Range   Phosphorus 2.1 (L) 2.5 - 4.6 mg/dL  Glucose, capillary Status: Abnormal   Collection Time: 09/16/15 4:39 PM  Result Value Ref Range   Glucose-Capillary 148 (H) 65 - 99 mg/dL  Glucose, capillary Status: None   Collection Time: 09/17/15 12:32 AM  Result Value Ref Range   Glucose-Capillary 87 65 - 99 mg/dL  Protime-INR Status: Abnormal   Collection Time: 09/17/15 5:35 AM  Result Value Ref Range   Prothrombin Time 16.7 (H) 11.6 - 15.2 seconds   INR 1.34 0.00 - 1.49  Comprehensive metabolic panel Status: Abnormal   Collection Time: 09/17/15 5:35 AM  Result Value Ref Range   Sodium 142 135 - 145 mmol/L   Potassium 2.6 (LL) 3.5 - 5.1 mmol/L   Chloride 122 (H) 101 - 111 mmol/L   CO2 14 (L)  22 - 32 mmol/L   Glucose, Bld 95 65 - 99 mg/dL   BUN 6 6 - 20 mg/dL   Creatinine, Ser 1.21 0.61 - 1.24 mg/dL   Calcium 7.3 (L) 8.9 - 10.3 mg/dL   Total Protein 5.0 (L) 6.5 - 8.1 g/dL   Albumin 2.2 (L) 3.5 - 5.0 g/dL   AST 34 15 - 41 U/L   ALT 23 17 - 63 U/L   Alkaline Phosphatase 82 38 - 126 U/L   Total Bilirubin 0.5 0.3 - 1.2 mg/dL   GFR calc non Af Amer >60 >60 mL/min   GFR calc Af Amer >60 >60 mL/min   Anion gap 6 5 - 15  Magnesium Status: Abnormal   Collection Time: 09/17/15 5:35 AM  Result Value Ref Range   Magnesium 1.4 (L) 1.7 - 2.4 mg/dL  Phosphorus Status: Abnormal  Collection Time: 09/17/15 5:35 AM  Result Value Ref Range   Phosphorus 1.6 (L) 2.5 - 4.6 mg/dL  Glucose, capillary Status: None   Collection Time: 09/17/15 5:35 AM  Result Value Ref Range   Glucose-Capillary 95 65 - 99 mg/dL      Imaging Results (Last 48 hours)    No results found.    Assessment/Plan: Diagnosis: Debility 1. Does the need for close, 24 hr/day medical supervision in concert with the patient's rehab needs make it unreasonable for this patient to be served in a less intensive setting? Yes 2. Co-Morbidities requiring supervision/potential complications: HIV (cont meds), previous right AKA (use prosthesis as much as possible), hypokalemia (replete as necessary), hypophosphatemia (replete as necessary), hypoprotenemia (consider supplement), anemia due to GI bleed (transfuse if necessary to ensure appropriate perfusion for increased activity tolerance), pain, Etoh abuse (cont counseling, watch for withdrawal), pressure ulcer (cont offloading and pressure relief) 3. Due to bladder management, bowel management, safety, skin/wound care, disease management, medication administration, pain management and patient education, does the patient require 24 hr/day rehab nursing? Yes 4. Does the patient require  coordinated care of a physician, rehab nurse, PT (1-2 hrs/day, 5 days/week), OT (1-2 hrs/day, 5 days/week) and SLP (1-2 hrs/day, 5 days/week) to address physical and functional deficits in the context of the above medical diagnosis(es)? Yes Addressing deficits in the following areas: balance, endurance, locomotion, strength, transferring, bowel/bladder control, bathing, dressing, feeding, grooming, toileting, swallowing and psychosocial support 5. Can the patient actively participate in an intensive therapy program of at least 3 hrs of therapy per day at least 5 days per week? Yes 6. The potential for patient to make measurable gains while on inpatient rehab is good 7. Anticipated functional outcomes upon discharge from inpatient rehab are modified independent with PT, modified independent with OT, modified independent with SLP. 8. Estimated rehab length of stay to reach the above functional goals is: 7-10 days. 9. Does the patient have adequate social supports and living environment to accommodate these discharge functional goals? Potentially 10. Anticipated D/C setting: Home 11. Anticipated post D/C treatments: HH therapy and Home excercise program 12. Overall Rehab/Functional Prognosis: good  RECOMMENDATIONS: This patient's condition is appropriate for continued rehabilitative care in the following setting: Pt has not had PT in several days and most recent session was without prosthesis. Would appreciate reevaluation from PT. If at that time, pt continues to requre IRF services, would recomment IRF once medical issues have stabalized (stablization of Hb/resolution of hematemesis) and pt agrees to participate in3 hours therapy/day. Patient has agreed to participate in recommended program. Potentially Note that insurance prior authorization may be required for reimbursement for recommended care.  Comment: Rehab Admissions Coordinator to follow up.   Delice Lesch, MD 09/17/2015         Revision History     Date/Time User Provider Type Action   09/19/2015 5:17 PM Ankit Lorie Phenix, MD Physician Addend   09/19/2015 5:12 PM Ankit Lorie Phenix, MD Physician Sign   09/17/2015 3:00 PM Cathlyn Parsons, PA-C Physician Assistant P[place]   View Details Report       Routing History     Date/Time From To Method   09/19/2015 5:17 PM Ankit Lorie Phenix, MD Ankit Lorie Phenix, MD In [facility]   09/19/2015 5:17 PM Ankit Lorie Phenix, MD Iline Oven, MD In Basket   09/19/2015 5:17 PM Ankit Lorie Phenix, MD Michel Bickers, MD In [facility]

## 2015-09-22 NOTE — Progress Notes (Signed)
Subjective: Interval History: has complaints wants to eat with family.  Objective: Vital signs in last 24 hours: Temp:  [98 F (36.7 C)-98.6 F (37 C)] 98.2 F (36.8 C) (11/23 0658) Pulse Rate:  [111-147] 113 (11/23 0658) Resp:  [18-20] 20 (11/23 0658) BP: (118-128)/(58-74) 118/58 mmHg (11/23 0658) SpO2:  [97 %-100 %] 97 % (11/23 0658) Weight:  [41.776 kg (92 lb 1.6 oz)] 41.776 kg (92 lb 1.6 oz) (11/23 0658) Weight change: 0.576 kg (1 lb 4.3 oz)  Intake/Output from previous day: 11/22 0701 - 11/23 0700 In: 1200 [P.O.:1200] Out: 4350 [Urine:4350] Intake/Output this shift:    General appearance: cooperative, cachectic and emaciated Resp: diminished breath sounds bilaterally and rales bibasilar Cardio: S1, S2 normal and systolic murmur: holosystolic 2/6, blowing at apex GI: soft, non-tender; bowel sounds normal; no masses,  no organomegaly Extremities: AKA  Lab Results:  Recent Labs  09/21/15 0552 09/22/15 0500  WBC 11.2* 11.4*  HGB 10.6* 9.6*  HCT 31.2* 29.0*  PLT 336 328   BMET:  Recent Labs  09/21/15 1852 09/22/15 0500  NA 138 137  K 4.2 3.8  CL 120* 119*  CO2 13* 12*  GLUCOSE 126* 107*  BUN 9 7  CREATININE 0.95 0.96  CALCIUM 8.4* 8.2*   No results for input(s): PTH in the last 72 hours. Iron Studies: No results for input(s): IRON, TIBC, TRANSFERRIN, FERRITIN in the last 72 hours.  Studies/Results: No results found.  I have reviewed the patient's current medications.  Assessment/Plan: 1 HIV 2 AKI resolved 3 e'lyte abn  Hypernatremia resolved and has reasonable urine OSM   Hypophos  Stable, refeeding issues, need to cont suppl, cannot say is wasting yet   Hypokalemia  Stable on suppl, refeeding also but may be wasting and Mg a role   Hypomag not sure is replete, cont suppl   Acidemia I feel he has RTA and with refractory nature probably proximal. Need to cont citrate and bicarb. REc cont current tx, will follow 1-2 d then f/u outpatient    LOS:  12 days   Maiah Sinning L 09/22/2015,8:51 AM

## 2015-09-22 NOTE — Interval H&P Note (Signed)
[name] was admitted today to Inpatient Rehabilitation with the diagnosis of debilitation related to Klebsiella pneumonia bacteremia, small bowel obstruction and upper GI bleed Wit hx of HIV/right AKA..  The patient's history has been reviewed, patient examined, and there is no change in status.  Patient continues to be appropriate for intensive inpatient rehabilitation.  I have reviewed the patient's chart and labs.  Questions were answered to the patient's satisfaction. The PAPE has been reviewed and assessment remains appropriate.  Ankit Lorie Phenix 09/22/2015, 8:16 PM

## 2015-09-22 NOTE — Discharge Summary (Signed)
Name: [name] MRN: [id number] DOB: 1961/09/28 54 y.o. PCP: Iline Oven, MD  Date of Admission: 09/10/2015  6:01 PM Date of Discharge: 09/22/2015 Attending Physician: Annia Belt, MD  Discharge Diagnosis: 1.  Active Problems:   Human immunodeficiency virus (HIV) disease ([place])   Alcohol abuse   Status post above knee amputation ([place])   Long term current use of anticoagulant   Avascular necrosis of femoral head (HCC)   Septic shock (HCC)   Gram-negative bacteremia (HCC)   Abdominal pain   Pneumonia due to Klebsiella pneumoniae (HCC)   Pressure ulcer   Protein-calorie malnutrition, severe   SBO (small bowel obstruction) (HCC)   Hyperosmolality and/or hypernatremia   Hypomagnesemia   Hypophosphatemia  Discharge Medications:   Medication List    STOP taking these medications        aspirin 81 MG tablet     EVOTAZ 300-150 MG tablet  Generic drug:  atazanavir-cobicistat     potassium chloride SA 20 MEQ tablet  Commonly known as:  K-DUR,KLOR-CON      TAKE these medications        acetaminophen 500 MG tablet  Commonly known as:  TYLENOL  Take 1 tablet (500 mg total) by mouth every 4 (four) hours as needed for mild pain, moderate pain or headache.     antiseptic oral rinse 0.05 % Liqd solution  Commonly known as:  CPC / CETYLPYRIDINIUM CHLORIDE 0.05%  7 mLs by Mouth Rinse route 2 times daily at 12 noon and 4 pm.     darunavir-cobicistat 800-150 MG tablet  Commonly known as:  [name]  Take 1 tablet by mouth daily. Swallow whole. Do NOT crush, break or chew tablets. Take with food.     ENSURE  Take 1 Can by mouth 3 (three) times daily between meals.     nutrition supplement (JUVEN) Pack  Take 1 packet by mouth 2 (two) times daily with a meal.     fenofibrate 145 MG tablet  Commonly known as:  TRICOR  Take 1 tablet (145 mg total) by mouth daily.     gabapentin 400 MG capsule  Commonly known as:  NEURONTIN  TAKE ONE CAPSULE BY MOUTH FOUR  TIMES DAILY     magnesium chloride 64 MG Tbec SR tablet  Commonly known as:  SLOW-MAG  Take 1 tablet (64 mg total) by mouth daily.     morphine 60 MG 12 hr tablet  Commonly known as:  MS CONTIN  Take 2 tablets (120 mg total) by mouth every 12 (twelve) hours.     multivitamin with minerals Tabs tablet  Take 1 tablet by mouth daily.     oxyCODONE 5 MG immediate release tablet  Commonly known as:  Oxy IR/ROXICODONE  Take 1 tablet (5 mg total) by mouth every 6 (six) hours as needed for severe pain.     pantoprazole 40 MG tablet  Commonly known as:  PROTONIX  Take 1 tablet (40 mg total) by mouth daily.     phosphorus 155-852-130 MG tablet  Commonly known as:  K PHOS NEUTRAL  Take 3 tablets (750 mg total) by mouth 2 (two) times daily.     potassium citrate 10 MEQ (1080 MG) SR tablet  Commonly known as:  UROCIT-K  Take 4 tablets (40 mEq total) by mouth 2 (two) times daily with a meal.     sodium bicarbonate 650 MG tablet  Take 2 tablets (1,300 mg total) by mouth 3 (three) times daily.  [name] 50 MG tablet  Generic drug:  dolutegravir  TAKE 1 TABLET (50 MG TOTAL) BY MOUTH DAILY.     [name] 300 MG tablet  Generic drug:  tenofovir  TAKE 1 TABLET (300 MG TOTAL) BY MOUTH DAILY.     warfarin 3 MG tablet  Commonly known as:  COUMADIN  Take 1 tablet (3 mg total) by mouth daily.        Disposition and follow-up:   [name] was discharged from [facility] in Stable condition.  At the hospital follow up visit please address:  1.  Hx of DVT: Coumadin was held during this hospitalization in the setting of recent GI bleed/ hematemesis. It was resumed on the day of discharge (09/22/15). Coumadin 3 mg qd. Please check INR regularly to decide dosing.   Patient continues to have electrolyte abnormalities including hypokalemia, hypophosphatemia, hypomagnesemia, and low bicarb - please continue to monitor labs daily and replete electrolytes orally.    HIV  medications: Resumed on the day of discharge. Evotaz was stopped due to possible interaction with Protonix. Patient was switched to [name] instead as per ID recommendation. Continue [name] and [name] as before.   2.  Labs / imaging needed at time of follow-up: PT/INR, CBC, Renal function panel, Mag   3.  Pending labs/ test needing follow-up:  Follow-up Appointments:     Follow-up Information    Follow up with Sherin Quarry, MD.   Specialty:  Internal Medicine   Contact information:   [place] [id number] [phone]       Discharge Instructions:   Consultations: Treatment Team:  Corliss Parish, MD  Procedures Performed:  Dg Chest Port 1 View  09/10/2015  CLINICAL DATA:  Abdominal pain and cough EXAM: PORTABLE CHEST 1 VIEW COMPARISON:  Three days ago FINDINGS: Interval tracheal extubation. Nasogastric tube reaches the stomach. Right subclavian central line, tip at the lower SVC. Partial clearing of still extensive bilateral airspace disease. No generalized edema, effusion, or air leak. Normal heart size and stable mediastinal contours. Bilateral humeral head avascular necrosis. IMPRESSION: Bilateral pneumonia has improved since 09/07/2015. Electronically Signed   By: Monte Fantasia M.D.   On: 09/10/2015 22:41   Dg Abd Portable 1v  09/14/2015  CLINICAL DATA:  Small bowel obstruction. EXAM: PORTABLE ABDOMEN - 1 VIEW COMPARISON:  09/10/2015 FINDINGS: There are dilated bowel loops in the mid abdomen, consistent with the given history of small-bowel obstruction. There is a moderate amount of formed stool throughout the colon. Enteric catheter is again seen, with tip at the expected location of the pylorus. Rectal probe is also noted. Mixed lytic and sclerotic appearance of the left femoral head is again seen. Query avascular necrosis. IMPRESSION: Continued small bowel obstruction, with no significant improvement from the prior radiograph. Constipation. Stable  positioning of the enteric catheter with tip at the expected location of the pylorus. Electronically Signed   By: Fidela Salisbury M.D.   On: 09/14/2015 09:09   Dg Abd Portable 2v  09/10/2015  CLINICAL DATA:  Abdominal pain and cough. Excess fluid from nasogastric tube EXAM: PORTABLE ABDOMEN - 2 VIEW COMPARISON:  Abdominal CT 09/07/2015 FINDINGS: Nasogastric tube tip near the pylorus. Ongoing partial small bowel obstruction but improved gaseous distension of small bowel loops in the left abdomen. Associated fold thickening persists. Hazy appearance of the lower abdomen correlating with ascites on previous study. No evidence of the pneumoperitoneum or pneumatosis. Avascular necrosis of the left femoral head with collapse.  Chest findings described on dedicated study. IMPRESSION: 1. Nasogastric tube tip at the pylorus. 2. Partial small bowel obstruction persists but bowel distention has improved since 09/07/2015. Electronically Signed   By: Monte Fantasia M.D.   On: 09/10/2015 22:32   Admission HPI: Mr. Shoaff is a 54 year old gentleman with a past medical history of HIV (CD4 208 09/06/2015), right AKA secondary to ischemia from unprovoked DVT on warfarin, CKD Stage 2, avascular necrosis of the femoral head who presents from [place] ICU with Klebsiella bacteremia, and small bowel obstruction. His symptoms started last week with nausea, vomiting, and reduced appetite. Then, on 11/7 his daughter, with whom he lives, noticed that he had been "vomiting blood." She called 911, and he said he was struggling for his life. On admission to [facility], he was noted to have severe electrolyte abnormalities (e.g. Na 117, K 2.6, AG 32, Ca 3.2) with an INR >10. He was also noted to have dark, tarry stools that were FOBT positive. He was intubated in the ED, and a CT of the C/A/P demonstrated bilateral lung consolidation consistent with pneumonia with possible aspiration, small bowel obstruction, mild right  hydronephrosis, and he was transferred to the ICU. He was transfused with 6U FFP and 3U pRBCs, and his INR improved to 1.54. An NG tube was place with suction to decompress his SBO. A repeat CT showed progression of the lung consolidation and wall thickening in the jejunum. The GI team did not feel that a EGD was warranted to query his upper GI bleed. An echo only demonstrated tricuspid regurgitation. He was noted to have a Klebsiella pneumonia bacteremia with an unclear source, but it was strongly considered that the source was intra-abdominal or from the gallbladder. A HIDA scan showed now cholecystitis but a reduced EF (27%). He was treated with vancomycin/meropenem/levaquin until discharge from [facility] even though sensitivities for blood Klebsiella were nearly pan-sensitive. Despite antibiotics, he WBCs continued to rise, reaching a peak of 19. Upon finishing his course in the ICU, he had requested to be transferred to [facility] because, "That's where they have my HIV medications."  Today, he reports this is major complaint is his abdominal pain, which he describes and "coming and going." He described the pain as all over. He reports the only thing that improves it is pain medication. He says he had two normal bowel movements in the past two days. He denies current nausea, vomiting, or diarrhea. He reports no episodes of hematemesis or hemoptysis since leaving the ICU. He has an unproductive cough. He has epigastric pain, but he denies any chest pain. He has some lower back pain on his left that appears to be long-standing, and he describes phantom limb pain in his RLE. He denies any chills, night sweats, or confusion.   He is followed by Dr. Michel Bickers for his HIV. He is treated with Atazanavir-Cobicistat 300-150 mg daily, Dolutegravir 50 mg daily, Tenofovir 300 mg daily. His last CD4 in or records was 700 on 08/23/15, which had decreased to 208 on 09/06/15 according to records in [place] from  [facility]. His last viral load was undetectable on 10/26. He reports good adherence with his HIV medications prior to his hospitalization. With respect to his anticoagulation, he is managed by Dr. Elie Confer in the [facility] coumadin clinic. Reviewing his visit on 10/24 with Dr. Elie Confer, his INR was 2.4. His INRs have routinely been supratherapeutic, up to 5 or 6. He is a former  smoker and denies alcohol and drug use. He lives at home with his 62 year old daughter [name]. He says it is very important that neither of his daughters know of his HIV status.  Upon transfer at [facility], he was afebrile and his WBC was 17.6 (NAb 14.7). He was hypernatremic (154) and mildly hypokalemic (3.4) with an AG of 6. His INR was 1.83. Corrected Calcium was 8.8.   Hospital Course by problem list: Active Problems:   Human immunodeficiency virus (HIV) disease ([place])   Alcohol abuse   Status post above knee amputation ([place])   Long term current use of anticoagulant   Avascular necrosis of femoral head (HCC)   Septic shock (HCC)   Gram-negative bacteremia (HCC)   Abdominal pain   Pneumonia due to Klebsiella pneumoniae (HCC)   Pressure ulcer   Protein-calorie malnutrition, severe   SBO (small bowel obstruction) (HCC)   Hyperosmolality and/or hypernatremia   Hypomagnesemia   Hypophosphatemia   Klebsiella pneumoniae bacteremia: Resolved. Likely from aspiration. Was on pressors at [facility]. White count 17.2 on admission but patient was afebrile. CXR here showing bilateral pneumonia, improved since 11/8. He was treated with a 10 day course of Unasyn for coverage of Klebsiella and possibly anaerobes, initially started at the other hospital on admission there on November 7. Blood culture showing no growth in 5 days and currently afebrile.   Abdominal Pain: Resolved. Known SBO is the primary contributor although there could be a functional cholestatic component given 27% EF on HIDA scan. Repeat abdominal X-ray here  showed that his SBO has improved since 11/8. The patient takes scheduled MS contin at home 60 mg x 12h with oxycodone IR 5 mg for breakthrough pain, which may have caused his SBO. He initially had an NGT in place for decompression. At present, patient is tolerating PO intake well and having regular BMs.   Hematemesis: Resolved. Unclear source of upper GI bleed. Hgb on admission 8.5, but Hgb is typically over 13. Differential would include mallory-weiss tear given week of vomiting, gastric ulcer, or possibly varices. Patient was treated with PPI. Hgb stable at 9.6 on discharge.   Non-gap Metabolic Acidosis: AG had resolved as no longer had lactic acidosis but persistently had low bicarb. Likely 2/2 to Normal Saline causing volume expansion. He continued to receive bicarb supplementation. 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   Hypokalemia, hypoMag, and hypophosphatemia - We continued to replete electrolytes.   Hypernatremia - Resolved. Likely 2/2 to free water deficit. Received Normal Saline extensively before transferring to [place]. He was kept on D5 to replete free water deficit and also to help with hypernatremia. Hypernatremia resolved now.   History of Unprovoked DVT: On anticoagulation indefinitely. Patient had consistently been supratherapeutic at anti-coag visits and presented to 10 at [facility]. Warfarin was held during the hospitalization in the setting of recent GI bleed/ hematemesis and resumed on the day of discharge. Patient was kept on Lovenox during his hospital stay but refused taking it as per nursing documentation.   HIV: Patient had a precipitous drop in his CD4 over the course of two weeks, from 700 to 219; however, this is likely related to his acute illness. HIV medications were held during this hospitalization and resumed on the day of discharge as patient has better PO intake now.  Acute on  Chronic Kidney Disease: Resolved. Baseline creatinine of ~1.2. Patient received IVF. SCr 0.96 on  the day of discharge.    Discharge Vitals:   BP 104/62 mmHg  Pulse 121  Temp(Src) 98 F (36.7 C) (Oral)  Resp 18  Ht 6' (1.829 m)  Wt 92 lb 1.6 oz (41.776 kg)  BMI 12.49 kg/m2  SpO2 94%  Discharge Labs:  Results for orders placed or performed during the hospital encounter of 09/10/15 (from the past 24 hour(s))  Glucose, capillary     Status: Abnormal   Collection Time: 09/21/15  5:11 PM  Result Value Ref Range   Glucose-Capillary 141 (H) 65 - 99 mg/dL  Renal function panel     Status: Abnormal   Collection Time: 09/21/15  6:52 PM  Result Value Ref Range   Sodium 138 135 - 145 mmol/L   Potassium 4.2 3.5 - 5.1 mmol/L   Chloride 120 (H) 101 - 111 mmol/L   CO2 13 (L) 22 - 32 mmol/L   Glucose, Bld 126 (H) 65 - 99 mg/dL   BUN 9 6 - 20 mg/dL   Creatinine, Ser 0.95 0.61 - 1.24 mg/dL   Calcium 8.4 (L) 8.9 - 10.3 mg/dL   Phosphorus 1.3 (L) 2.5 - 4.6 mg/dL   Albumin 2.9 (L) 3.5 - 5.0 g/dL   GFR calc non Af Amer >60 >60 mL/min   GFR calc Af Amer >60 >60 mL/min   Anion gap 5 5 - 15  Urinalysis, Routine w reflex microscopic (not at [facility])     Status: Abnormal   Collection Time: 09/21/15  8:14 PM  Result Value Ref Range   Color, Urine YELLOW YELLOW   APPearance CLEAR CLEAR   Specific Gravity, Urine 1.022 1.005 - 1.030   pH 7.5 5.0 - 8.0   Glucose, UA >1000 (A) NEGATIVE mg/dL   Hgb urine dipstick SMALL (A) NEGATIVE   Bilirubin Urine NEGATIVE NEGATIVE   Ketones, ur NEGATIVE NEGATIVE mg/dL   Protein, ur 100 (A) NEGATIVE mg/dL   Nitrite NEGATIVE NEGATIVE   Leukocytes, UA NEGATIVE NEGATIVE  Urine microscopic-add on     Status: Abnormal   Collection Time: 09/21/15  8:14 PM  Result Value Ref Range   Squamous Epithelial / LPF 0-5 (A) NONE SEEN   WBC, UA 0-5 0 - 5 WBC/hpf   RBC / HPF 0-5 0 - 5 RBC/hpf   Bacteria, UA RARE (A) NONE SEEN  Osmolality, urine     Status: None   Collection Time:  09/21/15  8:15 PM  Result Value Ref Range   Osmolality, Ur 447 300 - 900 mOsm/kg  Na and K (sodium & potassium), rand urine     Status: None   Collection Time: 09/21/15  8:20 PM  Result Value Ref Range   Sodium, Ur 74 mmol/L   Potassium Urine Timed 34 mmol/L  Glucose, capillary     Status: Abnormal   Collection Time: 09/22/15  1:13 AM  Result Value Ref Range   Glucose-Capillary 133 (H) 65 - 99 mg/dL  Glucose, capillary     Status: None   Collection Time: 09/22/15  4:26 AM  Result Value Ref Range   Glucose-Capillary 73 65 - 99 mg/dL  Protime-INR     Status: None   Collection Time: 09/22/15  5:00 AM  Result Value Ref Range   Prothrombin Time 14.9 11.6 - 15.2 seconds   INR 1.15 0.00 - 1.49  CBC     Status: Abnormal   Collection Time: 09/22/15  5:00 AM  Result Value Ref Range   WBC 11.4 (H) 4.0 - 10.5  K/uL   RBC 3.30 (L) 4.22 - 5.81 MIL/uL   Hemoglobin 9.6 (L) 13.0 - 17.0 g/dL   HCT 29.0 (L) 39.0 - 52.0 %   MCV 87.9 78.0 - 100.0 fL   MCH 29.1 26.0 - 34.0 pg   MCHC 33.1 30.0 - 36.0 g/dL   RDW 18.7 (H) 11.5 - 15.5 %   Platelets 328 150 - 400 K/uL  Magnesium     Status: None   Collection Time: 09/22/15  5:00 AM  Result Value Ref Range   Magnesium 1.7 1.7 - 2.4 mg/dL  TSH     Status: None   Collection Time: 09/22/15  5:00 AM  Result Value Ref Range   TSH 1.124 0.350 - 4.500 uIU/mL  Renal function panel     Status: Abnormal   Collection Time: 09/22/15  5:00 AM  Result Value Ref Range   Sodium 137 135 - 145 mmol/L   Potassium 3.8 3.5 - 5.1 mmol/L   Chloride 119 (H) 101 - 111 mmol/L   CO2 12 (L) 22 - 32 mmol/L   Glucose, Bld 107 (H) 65 - 99 mg/dL   BUN 7 6 - 20 mg/dL   Creatinine, Ser 0.96 0.61 - 1.24 mg/dL   Calcium 8.2 (L) 8.9 - 10.3 mg/dL   Phosphorus 2.4 (L) 2.5 - 4.6 mg/dL   Albumin 2.9 (L) 3.5 - 5.0 g/dL   GFR calc non Af Amer >60 >60 mL/min   GFR calc Af Amer >60 >60 mL/min   Anion gap 6 5 - 15  Glucose, capillary     Status: Abnormal   Collection Time: 09/22/15   5:38 AM  Result Value Ref Range   Glucose-Capillary 143 (H) 65 - 99 mg/dL  Glucose, capillary     Status: Abnormal   Collection Time: 09/22/15 12:04 PM  Result Value Ref Range   Glucose-Capillary 155 (H) 65 - 99 mg/dL    Signed: Shela Leff, MD 09/22/2015, 2:50 PM    Services Ordered on Discharge:  Equipment Ordered on Discharge:

## 2015-09-22 NOTE — Progress Notes (Signed)
Subjective: Patient was seen and examined at bedside this morning. Reported feeling well and not having any complaints at present. Denied having any CP, SOB, or abdominal pain. Reports having improved PO intake.    Objective: Vital signs in last 24 hours: Filed Vitals:   09/21/15 1209 09/21/15 1303 09/21/15 2016 09/22/15 0658  BP: 128/74  121/62 118/58  Pulse: 125 147 111 113  Temp: 98.6 F (37 C)  98 F (36.7 C) 98.2 F (36.8 C)  TempSrc: Oral  Oral Oral  Resp: _0 Height:      Weight:    92 lb 1.6 oz (41.776 kg)  SpO2: 100%  100% 97%   Weight change: 1 lb 4.3 oz (0.576 kg)  Intake/Output Summary (Last 24 hours) at 09/22/15 1121 Last data filed at 09/22/15 0913  Gross per 24 hour  Intake   1200 ml  Output   4550 ml  Net  -3350 ml   Physical Exam  Constitutional: He is oriented to person, place, and time. No distress.  Cachectic   Cardiovascular: Normal rate, regular rhythm and intact distal pulses.  Exam reveals no gallop and no friction rub.   No murmur heard. Pulmonary/Chest: Effort normal and breath sounds normal. No respiratory distress. He has no wheezes. He has no rales.  Abdominal: Soft. Bowel sounds are normal. He exhibits no distension. There is no tenderness. There is no rebound and no guarding.  Neurological: He is alert and oriented to person, place, and time.  Skin: Skin is warm and dry.   Lab Results: Basic Metabolic Panel:  Recent Labs Lab 09/21/15 0552 09/21/15 1852 09/22/15 0500  NA 140 138 137  K 3.5 4.2 3.8  CL 122* 120* 119*  CO2 12* 13* 12*  GLUCOSE 80 126* 107*  BUN _1 CREATININE 0.90 0.95 0.96  CALCIUM 8.3* 8.4* 8.2*  MG 1.8  --  1.7  PHOS 2.8 1.3* 2.4*   Liver Function Tests:  Recent Labs Lab 09/16/15 1523 09/17/15 0535  09/21/15 1852 09/22/15 0500  AST 40 34  --   --   --   ALT 24 23  --   --   --   ALKPHOS 86 82  --   --   --   BILITOT 0.8 0.5  --   --   --   PROT 5.2* 5.0*  --   --   --   ALBUMIN 2.4*  2.2*  < > 2.9* 2.9*  < > = values in this interval not displayed. CBC:  Recent Labs Lab 09/18/15 0450  09/21/15 0552 09/22/15 0500  WBC 10.6*  < > 11.2* 11.4*  NEUTROABS 7.8*  --   --   --   HGB 7.2*  < > 10.6* 9.6*  HCT 21.0*  < > 31.2* 29.0*  MCV 85.7  < > 86.4 87.9  PLT 301  < > 336 328  < > = values in this interval not displayed. Coagulation:  Recent Labs Lab 09/19/15 0520 09/20/15 0358 09/21/15 0552 09/22/15 0500  LABPROT 16.6* 15.6* 15.4* 14.9  INR 1.33 1.22 1.20 1.15   Urine Drug Screen: Drugs of Abuse     Component Value Date/Time   LABOPIA * 06/23/2009 1831    POSITIVE (NOTE) Result repeated and verified. Sent for confirmatory testing   COCAINSCRNUR NEGATIVE 06/23/2009 1831   LABBENZ NEGATIVE 06/23/2009 1831   AMPHETMU NEGATIVE 06/23/2009 1831    Micro Results: No results found for this  or any previous visit (from the past 240 hour(s)). Studies/Results: No results found. Medications: I have reviewed the patient's current medications. Scheduled Meds: . sodium chloride   Intravenous Once  . antiseptic oral rinse  7 mL Mouth Rinse q12n4p  . chlorhexidine  15 mL Mouth Rinse BID  . dolutegravir  50 mg Oral Daily  . enoxaparin (LOVENOX) injection  40 mg Subcutaneous Q24H  . feeding supplement (ENSURE ENLIVE)  237 mL Oral QID  . insulin aspart  0-9 Units Subcutaneous 4 times per day  . magnesium chloride  1 tablet Oral Daily  . multivitamin with minerals  1 tablet Oral Daily  . nutrition supplement (JUVEN)  1 packet Oral BID WC  . pantoprazole  40 mg Oral Daily  . phosphorus  750 mg Oral BID  . potassium chloride  20 mEq Oral BID  . potassium citrate  40 mEq Oral BID WC  . sodium bicarbonate  1,300 mg Oral TID  . tenofovir  300 mg Oral Daily   Continuous Infusions:   PRN Meds:.acetaminophen, oxyCODONE, sodium chloride Assessment/Plan: Active Problems:   Human immunodeficiency virus (HIV) disease (HCC)   Alcohol abuse   Status post above knee  amputation ([place])   Long term current use of anticoagulant   Avascular necrosis of femoral head (HCC)   Septic shock (HCC)   Gram-negative bacteremia (HCC)   Abdominal pain   Pneumonia due to Klebsiella pneumoniae (HCC)   Pressure ulcer   Protein-calorie malnutrition, severe   SBO (small bowel obstruction) (HCC)   Hyperosmolality and/or hypernatremia   Hypomagnesemia   Hypophosphatemia  Septic shock 2/2 to Klebsiella pneumoniae Bacteremia likely from GI (aspiration) vs lung: Resolved.  Was on pressors at [facility]. Imaging shows bilateral pneumonia has improved since 11/8. Afebrile currently. Blood culture showing no growth in 5 days. Satting 97-100% on RA. He was treated with antibiotics for 10 days, initially started at the other hospital on admission there on November 7. Unasyn was discontinued on 11/17.   Partial SBO: Resolved. Abdominal X-ray showing partial SBO, improved since 11/8. Patient was previously on NGT which he pulled out a few nights ago. He was previously given an enema and abdominal exam improved after that- no abdominal tenderness or distention and +BS on exam today. He is tolerating PO intake (soft diet) well and having regular bowel movements.   - Hydromorphone 1 mg q4h prn - Soft (dysphagia 3) diet   Hematemesis: Unclear source of upper GI bleed. Hgb 8.9 on 11/15, with baseline over 13. Likely in the setting of elevated INR during previous hospitalization. INR 1.15 today. However, Hgb dropped to 6.8 on 11/20 and patient was transfused with 2 U PRBCs. Low hgb could partially be due to hemodilution because patient has been receiving a lot of fluids to correct his electrolyte abnormalities. Hgb 9.6 today.  - Protonix IV 40 mg - Monitor H/H  GB dysfunction: cholestasis, EF 27% per HIDA.  - GI on board, appreciate recs  High INR without warfarin - with History of Unprovoked DVT:  Patient had consistently been supratherapeutic at anti-coag visits and presented to  10 at [facility]. We held Coumadin for this reason. High INR has resolved now, 1.15 today.  - Eliquis 10 mg BID x 7 days, then 5 mg BID - F/u daily cbc and INR  Acute on Chronic Kidney Disease - likely 2/2 to ATN: Baseline creatinine of ~1.2. Was in 7's at outside hospital, SCr 0.96 today.   - continue to  monitor.  - F/u daily BMP  Non gap Metabolic Acidosis: AG is resolved as no longer has lactic acidosis but persistently has low bicarb (12 today).  - appreciate nephro recs.  - cont oral sodium bicarbonate 1300 mg TID.  Hypokalemia and hypoMag - K normal (3.8) this morning. Mag 1.7. -cont oral potassium supplementation - Magnesium repleted  - Urocit K 75mq bid until K replete then d/c.  - Renal function panel q12 hrs   Hypophosphatemia - Phosphorous 2.4 today. Low phosphorous likely in the setting of severe malnutrition and chronic ETOH use.  - Kphos tabs 1500 mg BID and replete - Once K, Mg stable, stop those and wait 72 h and look at urine studies.  Hypernatremia - likely 2/2 to free water deficit Received Normal Saline extensively before [place] to [place] improved to 137 today.   High urine output: Dose of DDAVP given on 11/16 for possible diabetes insipidus. His urine OSM are not compatible with DI incomplete or complete. May have some incomplete DI with post ATN diuresis, and low Mg/K which prohibit concentrating. Not likely RTA as urine pH is normal. Urine output 4.3 L in the past 24 hrs. -Will hold DDAVP and allow SNa to rise and use po DDAVP if needed. -Appreciate nephrology recs    HIV: CD4 decreased from 700 to 219 in a matter of 2 weeks, likely in the setting of acute illness. Viral load <20 in 07/2015.    -Restart anti-retroviral regimen today -F/u HLA B-57 (01)  Elevated AST: To 62. Likely downtrending from previous septic shock, but could represent underlying liver dysfunction driving up his INR. AST normal now.     Stage II pressure ulcer -Foam dressing as  recommended by wound care   Avascular Necrosis of Hip and Phantom Limb Pain: Pain management per above.   DVT Prophylaxis: Eliquis   Code Status: Full  Dispo: Disposition is deferred at this time, awaiting improvement of current medical problems.  Anticipated discharge in approximately 0 day(s).   The patient does have a current PCP (Iline Oven MD) and [facility].  The patient does not have transportation limitations that hinder transportation to clinic appointments.  .Services Needed at time of discharge: Y = Yes, Blank = No PT:   OT:   RN:   Equipment:   Other:     LOS: 12 days   Matthew Leff MD 09/22/2015, 11:21 AM

## 2015-09-22 NOTE — Progress Notes (Signed)
Matthew Diones, RN Rehab Admission Coordinator Signed Physical Medicine and Rehabilitation PMR Pre-admission 09/20/2015 2:02 PM  Related encounter: Admission (Current) from 09/10/2015 in [facility]    Expand All Collapse All   PMR Admission Coordinator Pre-Admission Assessment  Patient: [name] is an 54 y.o., male MRN: [id number] DOB: 12/30/1960 Height: 6' (182.9 cm) Weight: 41.776 kg (92 lb 1.6 oz) (bed scale)  Insurance Information HMO: No PPO: PCP: IPA: 80/20: OTHER:  PRIMARY: Medicare A/B Policy#: [id number] Subscriber: [name] CM Name: Phone#: Fax#:  Pre-Cert#: Employer: Not employed/Disabled Benefits: Phone #: Name: Checked in [place]. Date: 07/30/98 Deduct: $1288 Out of Pocket Max: none Life Max: unlimited CIR: 100% SNF: 100 days Outpatient: 80% Co-Pay: 20% Home Health: 100% Co-Pay: none DME: 80% Co-Pay: 20% Providers: patient's choice  SECONDARY: Medicaid [place] access Policy#: [id number] Subscriber: [name] CM Name: Phone#: Fax#:  Pre-Cert#: Employer: Disabled/ Not employed Benefits: Phone #: [phone] Name: Automated Eff. Date: Eligible 09/21/15 coverage code [id number] Deduct: Out of Pocket Max: Life Max:  CIR: SNF:  Outpatient: Co-Pay:  Home Health: Co-Pay:  DME: Co-Pay:   Emergency Contact Information Contact Information    Name Relation Home Work [place]    [phone]     Current Medical History  Patient Admitting Diagnosis: Debility  History of Present Illness: A 54 y.o. right handed male with history of HIV,  right AKA due to DVT maintained on Coumadin. Patient lives with his 45 year old daughter independent with assistive device prior to admission and prosthesis. Pt's daughter is planning on going to work and is in the process of trying to obtain some assistance for her father. One level apt with ramped entrance. Presented in transfer from [facility] 09/10/2015 with dark tarry stools and hematemesis. INR greater than 10 and reversed. Found to be acidotic, hypernatremic/hypokalemic required intubation. Chest x-ray bilateral pneumonia question aspiration. Placed on broad-spectrum antibiotics. He was transfused. Blood cultures positive for Klebsiella. KUB of the abdomen showed partial small bowel obstruction requiring nasogastric tube. Nephrology services follow-up for hypernatremia as well as hypokalemia/acute renal insufficiency with supplement added and latest sodium improved as well as hypokalemia reversed. Hemoglobin remained stable 7.7-9.6. Subcutaneous Lovenox for DVT prophylaxis. Remains on a full liquid diet and advance to mechanical soft nasogastric tube removed. Wound care nurse follow-up for sacral wound with dressing changes as advised. Plan to resume anti-retroviral regimen once by mouth intake improved. Physical therapy evaluation completed and ongoing noting debilitation with recommendations of physical medicine rehabilitation consult. Patient to be admitted for comprehensive inpatient rehabilitation program.  Past Medical History  Past Medical History  Diagnosis Date  . History of chicken pox   . Hyperlipidemia   . History of DVT of lower extremity     right  . Left hip pain 03/09/2013  . Pneumonia   . Chronic kidney disease   . Protein malnutrition ([place]) 08/2015  . HIV (human immunodeficiency virus infection) ([place])     Family History  family history includes Arthritis in his mother; Kidney disease in his maternal grandfather.  Prior  Rehab/Hospitalizations: Had outpatient therapy through [facility] a couple months ago.  Has the patient had major surgery during 100 days prior to admission? No  Current Medications   Current facility-administered medications:  . 0.9 % sodium chloride infusion, , Intravenous, Once, Tasrif Ahmed, MD . acetaminophen (TYLENOL) tablet 500 mg, 500 mg, Oral, Q4H PRN, Dellia Nims, MD, 500 mg  at 09/21/15 2239 . antiseptic oral rinse (CPC / CETYLPYRIDINIUM CHLORIDE 0.05%) solution 7 mL, 7 mL, Mouth Rinse, q12n4p, Campbell Riches, MD, 7 mL at 09/21/15 1643 . chlorhexidine (PERIDEX) 0.12 % solution 15 mL, 15 mL, Mouth Rinse, BID, Campbell Riches, MD, 15 mL at 09/21/15 0921 . enoxaparin (LOVENOX) injection 40 mg, 40 mg, Subcutaneous, Q24H, Annia Belt, MD, 40 mg at 09/17/15 0910 . feeding supplement (ENSURE ENLIVE) (ENSURE ENLIVE) liquid 237 mL, 237 mL, Oral, QID, Reanne J Barbato, RD, 237 mL at 09/22/15 1031 . insulin aspart (novoLOG) injection 0-9 Units, 0-9 Units, Subcutaneous, 4 times per day, Eudelia Bunch, [facility], 1 Units at 09/21/15 1740 . magnesium chloride (SLOW-MAG) 64 MG SR tablet 64 mg, 1 tablet, Oral, Daily, Tasrif Ahmed, MD . multivitamin with minerals tablet 1 tablet, 1 tablet, Oral, Daily, Reanne J Barbato, RD, 1 tablet at 09/22/15 1028 . nutrition supplement (JUVEN) (JUVEN) powder packet 1 packet, 1 packet, Oral, BID WC, Annia Belt, MD, 1 packet at 09/22/15 0805 . oxyCODONE (Oxy IR/ROXICODONE) immediate release tablet 5 mg, 5 mg, Oral, Q6H PRN, Shela Leff, MD, 5 mg at 09/22/15 0808 . pantoprazole (PROTONIX) EC tablet 40 mg, 40 mg, Oral, Daily, Annia Belt, MD, 40 mg at 09/22/15 1028 . phosphorus (K PHOS NEUTRAL) tablet 750 mg, 750 mg, Oral, BID, Annia Belt, MD, 750 mg at 09/21/15 2239 . potassium chloride SA (K-DUR,KLOR-CON) CR tablet 20 mEq, 20 mEq, Oral, BID, Shela Leff, MD, 20 mEq at 09/22/15 1033 . potassium citrate  (UROCIT-K) SR tablet 40 mEq, 40 mEq, Oral, BID WC, Annia Belt, MD, 40 mEq at 09/22/15 0802 . sodium bicarbonate tablet 1,300 mg, 1,300 mg, Oral, TID, Shela Leff, MD, 1,300 mg at 09/22/15 1028 . sodium chloride 0.9 % injection 10-40 mL, 10-40 mL, Intracatheter, PRN, Campbell Riches, MD, 30 mL at 09/22/15 0501  Patients Current Diet: DIET DYS 3 Room service appropriate?: Yes; Fluid consistency:: Thin  Precautions / Restrictions Precautions Precautions: Fall Precaution Comments: watch HR Restrictions Weight Bearing Restrictions: No   Has the patient had 2 or more falls or a fall with injury in the past year?No  Prior Activity Level Community (5-7x/wk): Went out daily up until he got sick 2 weeks ago.  Home Assistive Devices / Equipment Home Assistive Devices/Equipment: Crutches Home Equipment: Walker - 2 wheels, Wheelchair - manual, Bedside commode  Prior Device Use: Indicate devices/aids used by the patient prior to current illness, exacerbation or injury? Motorized wheelchair or scooter, Walker and straight cane and crutches.  Prior Functional Level Prior Function Level of Independence: Independent with assistive device(s)  Self Care: Did the patient need help bathing, dressing, using the toilet or eating? Independent. Patient says his daughter does the cooking.  Indoor Mobility: Did the patient need assistance with walking from room to room (with or without device)? Independent  Stairs: Did the patient need assistance with internal or external stairs (with or without device)? Independent  Functional Cognition: Did the patient need help planning regular tasks such as shopping or remembering to take medications? Independent  Current Functional Level Cognition  Overall Cognitive Status: Within Functional Limits for tasks assessed Orientation Level: Disoriented to time   Extremity Assessment (includes Sensation/Coordination)  Upper Extremity  Assessment: Generalized weakness (R AKA)  Lower Extremity Assessment: Generalized weakness    ADLs       Mobility  Overal bed mobility: Needs Assistance Bed Mobility: Supine to Sit, Sit to Supine Supine to sit: Min assist General  bed mobility comments: increased time, pt with difficulty processing how to sit up, cues for use of rail and to scoot to edge of bed    Transfers  Overall transfer level: Needs assistance Equipment used: Rolling walker (2 wheeled) Transfers: Sit to/from Stand Sit to Stand: Mod assist Stand pivot transfers: Min assist Squat pivot transfers: Mod assist General transfer comment: lifting assist from bed, lowering assist for safety to chair with cues for hand placement    Ambulation / Gait / Stairs / Wheelchair Mobility  Ambulation/Gait Ambulation/Gait assistance: Min assist Ambulation Distance (Feet): 50 Feet (x 2 with seated rest in hall) Assistive device: Rolling walker (2 wheeled) Gait Pattern/deviations: Step-to pattern General Gait Details: slow, wearing shoe, limited due to HR up to 147 Gait velocity: controlled Gait velocity interpretation: Below normal speed for age/gender    Posture / Balance Balance Overall balance assessment: Needs assistance Sitting-balance support: Single extremity supported Sitting balance-Leahy Scale: Good Standing balance support: Bilateral upper extremity supported Standing balance-Leahy Scale: Poor Standing balance comment: UE support on walker    Special needs/care consideration BiPAP/CPAP No CPM No Continuous Drip IV No Dialysis No  Life Vest No Oxygen No Special Bed No Trach Size No Wound Vac (area) No  Skin Pressure sore on coccyx  Bowel mgmt: Had BM 09/20/15 Bladder mgmt: urinary catheter in place Diabetic mgmt No    Previous Home Environment Living Arrangements: Children (daughter) Available Help at Discharge: Family, Available 24  hours/day Type of Home: House Home Layout: One level Home Access: Stairs to enter, Ramped entrance Technical brewer of Steps: did not state Home Care Services: No Additional Comments: prosthesis  Discharge Living Setting Plans for Discharge Living Setting: Lives with (comment), Apartment (Dtr lives with patient.) Type of Home at Discharge: Apartment (Handicap accessible.) Discharge Home Layout: One level Discharge Home Access: Ramped entrance Does the patient have any problems obtaining your medications?: No  Social/Family/Support Systems Patient Roles: Parent (Has a daughter.) Contact Information: Verner Chol - daughter Anticipated Caregiver: [name] Anticipated Caregiver's Contact Information: [name] - dtr [phone] Ability/Limitations of Caregiver: Dtr at home with patient. Looking for a job, but unable to find a job yet. Caregiver Availability: 24/7 Discharge Plan Discussed with Primary Caregiver: Yes Is Caregiver In Agreement with Plan?: Yes Does Caregiver/Family have Issues with Lodging/Transportation while Pt is in Rehab?: No  Goals/Additional Needs Patient/Family Goal for Rehab: PT/OT/St mod I goals Expected length of stay: 7-10 days Cultural Considerations: None Dietary Needs: Dys 3, thin liquids Equipment Needs: TBD Pt/Family Agrees to Admission and willing to participate: Yes Program Orientation Provided & Reviewed with Pt/Caregiver Including Roles & Responsibilities: Yes  Decrease burden of Care through IP rehab admission: N/A  Possible need for SNF placement upon discharge: Not planned  Patient Condition: This patient's medical and functional status has changed since the consult dated: 09/17/15 in which the Rehabilitation Physician determined and documented that the patient's condition is appropriate for intensive rehabilitative care in an inpatient rehabilitation facility. See "History of Present Illness" (above) for medical update. Functional changes  are: Currently requiring min/mod assist for transfers and ambulated 100 ft RW. Patient's medical and functional status update has been discussed with the Rehabilitation physician and patient remains appropriate for inpatient rehabilitation. Will admit to inpatient rehab today.  Preadmission Screen Completed By: [name], 09/22/2015 10:35 AM ______________________________________________________________________  Discussed status with Dr. Posey Pronto on 09/22/15 at 54 and received telephone approval for admission today.  Admission Coordinator: [name], time1035/Date11/23/2016  Cosigned by: Ankit Lorie Phenix, MD at 09/22/2015 11:34 AM  Revision History     Date/Time User Provider Type Action   09/22/2015 11:34 AM Ankit Lorie Phenix, MD Physician Cosign   09/22/2015 10:36 AM Matthew Diones, RN Rehab Admission Coordinator Sign   09/22/2015 10:36 AM Matthew Diones, RN Rehab Admission Coordinator Sign   View Details Report

## 2015-09-22 NOTE — Progress Notes (Signed)
Pt admitted to rehab from 3E17. Alert and orientated x4. Mild memory deficits noted. Orientated to rehab expectations, therapy schedule, safety, etc. Pt looking forward to starting therapy.

## 2015-09-22 NOTE — H&P (View-Only) (Signed)
Physical Medicine and Rehabilitation Admission H&P    Chief complaint: Weakness  HPI: [name] is a 54 y.o. 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 Patient lives with his 4 year old daughter independent with assistive device prior to admission and prosthesis. Pt's daughter is planning on going to work and is in the process of trying to obtain some assistance for her father. One level apt with ramped entrance. Presented in transfer from [facility] 09/10/2015 with dark tarry stools and hematemesis. INR greater than 10 and reversed. Found to be acidotic, hypernatremic/hypokalemic required intubation. Chest x-ray bilateral pneumonia question aspiration. Placed on broad-spectrum antibiotics. He was transfused. Blood cultures positive for Klebsiella. KUB of the abdomen showed partial small bowel obstruction requiring nasogastric tube. Nephrology services follow-up for hypernatremia as well as hypokalemia/acute renal insufficiency with supplement added and latest sodium improved as well as hypokalemia reversed. Hemoglobin remained stable 7.7-9.6. Subcutaneous Lovenox for DVT prophylaxis and later discontinued with Eliquis initiated for history of DVT . Remains on a full liquid diet and advance to mechanical soft nasogastric tube removed. Wound care nurse follow-up for sacral wound with dressing changes as advised. Plan to resume anti-retroviral regimen once by mouth intake improved. Physical therapy evaluation completed and ongoing noting debilitation with recommendations of physical medicine rehabilitation consult. Patient was admitted for comprehensive rehabilitation program  ROS Review of Systems  Constitutional: Positive for weight loss. Negative for fever and chills.  HENT: Positive for tinnitus.  Eyes: Negative for blurred vision and double vision.  Respiratory: Negative for cough and shortness of breath.  Cardiovascular: Negative  for chest pain and palpitations.  Gastrointestinal: Negative for abdominal pain.  Genitourinary: Negative for dysuria and hematuria.  Musculoskeletal: Positive for myalgias and joint pain.  Skin: Negative for rash.  Neurological: Positive for weakness.  All other systems reviewed and are negative   Past Medical History  Diagnosis Date  . History of chicken pox   . Hyperlipidemia   . History of DVT of lower extremity     right  . Left hip pain 03/09/2013  . Pneumonia   . Chronic kidney disease   . Protein malnutrition ([place]) 08/2015  . HIV (human immunodeficiency virus infection) [facility])    Past Surgical History  Procedure Laterality Date  . Appendectomy  1962  . Leg amputation above knee  2010    right leg   Family History  Problem Relation Age of Onset  . Arthritis Mother   . Kidney disease Maternal Grandfather    Social History:  reports that he has been smoking Cigarettes.  He has a 18 pack-year smoking history. He has never used smokeless tobacco. He reports that he does not drink alcohol or use illicit drugs. Allergies:  Allergies  Allergen Reactions  . Dapsone     REACTION: fever  . Sulfamethoxazole-Trimethoprim     REACTION: fever   Medications Prior to Admission  Medication Sig Dispense Refill  . aspirin 81 MG tablet Take 81 mg by mouth daily.      Marland Kitchen ENSURE (ENSURE) Take 1 Can by mouth 3 (three) times daily between meals. 237 mL prn  . fenofibrate (TRICOR) 145 MG tablet Take 1 tablet (145 mg total) by mouth daily. 30 tablet 5  . gabapentin (NEURONTIN) 400 MG capsule TAKE ONE CAPSULE BY MOUTH FOUR TIMES DAILY 120 capsule 5  . morphine (MS CONTIN) 60 MG 12 hr tablet Take 2 tablets (120 mg total) by mouth  every 12 (twelve) hours. 120 tablet 0  . oxyCODONE (OXY IR/ROXICODONE) 5 MG immediate release tablet Take 1 tablet (5 mg total) by mouth every 6 (six) hours as needed for severe pain. 60 tablet 0  . potassium chloride SA (K-DUR,KLOR-CON) 20 MEQ tablet TAKE 1 TABLET  BY MOUTH EVERY DAY 30 tablet 3  . [name] 50 MG tablet TAKE 1 TABLET (50 MG TOTAL) BY MOUTH DAILY. 30 tablet 11  . [name] 300 MG tablet TAKE 1 TABLET (300 MG TOTAL) BY MOUTH DAILY. 30 tablet 11  . warfarin (COUMADIN) 5 MG tablet Take 2.5-5 mg by mouth daily. On Sunday Monday Wednesday Friday patient takes 2.5 mg and on all other days patient takes 5 mg.    . EVOTAZ 300-150 MG per tablet TAKE 1 TABLET BY MOUTH DAILY. SWALLOW WHOLE. DO NOT CRUSH, CUT OR CHEW TABLET. TAKE WITH FOOD. 30 tablet 11    Home: Home Living Family/patient expects to be discharged to:: Private residence Living Arrangements: Children (daughter) Available Help at Discharge: Family, Available 24 hours/day Type of Home: House Home Access: Stairs to enter, Ramped entrance Technical brewer of Steps: did not state Home Layout: One Hannah: Environmental consultant - 2 wheels, Wheelchair - manual, Bedside commode Additional Comments: prosthesis   Functional History: Prior Function Level of Independence: Independent with assistive device(s)  Functional Status:  Mobility: Bed Mobility Overal bed mobility: Modified Independent Bed Mobility: Supine to Sit, Sit to Supine Supine to sit: Mod assist General bed mobility comments: already in the chair Transfers Overall transfer level: Needs assistance Equipment used: Rolling walker (2 wheeled), 2 person hand held assist Transfers: Sit to/from Stand Sit to Stand: Min assist, Mod assist (from lower surfaces, min higher surfaces) Stand pivot transfers: Min assist Squat pivot transfers: Mod assist General transfer comment: reminded hand placement then pt did independently Ambulation/Gait Ambulation/Gait assistance: Min assist Ambulation Distance (Feet): 100 Feet Assistive device: Rolling walker (2 wheeled), 1 person hand held assist (second person for equpment and safety) Gait Pattern/deviations: Step-to pattern General Gait Details: slow and steady swing forward. Gait  velocity: controlled Gait velocity interpretation: Below normal speed for age/gender    ADL:    Cognition: Cognition Overall Cognitive Status: Within Functional Limits for tasks assessed Orientation Level: Oriented to person, Oriented to place, Oriented to situation, Disoriented to time Cognition Arousal/Alertness: Awake/alert Behavior During Therapy: WFL for tasks assessed/performed Overall Cognitive Status: Within Functional Limits for tasks assessed  Physical Exam: Blood pressure 108/58, pulse 96, temperature 98.7 F (37.1 C), temperature source Oral, resp. rate 18, height 6' (1.829 m), weight 41.2 kg (90 lb 13.3 oz), SpO2 100 %. Physical Exam Constitutional: No distress.  54 year old cachectic African-American male appearing older than stated age  HENT:  Head: Normocephalic and atraumatic.  Eyes: Conjunctivae and EOM are normal.  Neck: Normal range of motion. No thyromegaly present.  Cardiovascular: Normal rate and regular rhythm.  Respiratory: Effort normal and breath sounds normal.  GI: Soft. Bowel sounds are normal. He exhibits no distension.  Musculoskeletal: He exhibits no edema or tenderness.  Right AKA  Neurological: He is alert and oriented to person and place.  Sensation intact to light touch throughout Motor: B/l UE 4/5 proximal to distal LLE: 4+/5 proximal to distal Right hip flexion 4/5.  Skin: Skin is warm and dry.  AKA site is healed. Sacral ulcer not examined  Psychiatric: Normal behavior.  Cognition and memory are impaired  Results for orders placed or performed during the hospital encounter of 09/10/15 (from  the past 48 hour(s))  Glucose, capillary     Status: Abnormal   Collection Time: 09/18/15  4:32 PM  Result Value Ref Range   Glucose-Capillary 114 (H) 65 - 99 mg/dL   Comment 1 Notify RN   Renal function panel     Status: Abnormal   Collection Time: 09/18/15  6:25 PM  Result Value Ref Range   Sodium 138 135 - 145 mmol/L   Potassium 2.6  (LL) 3.5 - 5.1 mmol/L    Comment: CRITICAL RESULT CALLED TO, READ BACK BY AND VERIFIED WITH: CIJANI,D RN 09/18/15 1936 WOOTEN,K    Chloride 119 (H) 101 - 111 mmol/L   CO2 14 (L) 22 - 32 mmol/L   Glucose, Bld 147 (H) 65 - 99 mg/dL   BUN <5 (L) 6 - 20 mg/dL   Creatinine, Ser 0.94 0.61 - 1.24 mg/dL   Calcium 7.1 (L) 8.9 - 10.3 mg/dL   Phosphorus 1.3 (L) 2.5 - 4.6 mg/dL   Albumin 2.2 (L) 3.5 - 5.0 g/dL   GFR calc non Af Amer >60 >60 mL/min   GFR calc Af Amer >60 >60 mL/min    Comment: (NOTE) The eGFR has been calculated using the CKD EPI equation. This calculation has not been validated in all clinical situations. eGFR's persistently <60 mL/min signify possible Chronic Kidney Disease.    Anion gap 5 5 - 15  Glucose, capillary     Status: Abnormal   Collection Time: 09/18/15 11:22 PM  Result Value Ref Range   Glucose-Capillary 191 (H) 65 - 99 mg/dL  Protime-INR     Status: Abnormal   Collection Time: 09/19/15  5:20 AM  Result Value Ref Range   Prothrombin Time 16.6 (H) 11.6 - 15.2 seconds   INR 1.33 0.00 - 1.49  CBC     Status: Abnormal   Collection Time: 09/19/15  5:20 AM  Result Value Ref Range   WBC 10.9 (H) 4.0 - 10.5 K/uL   RBC 2.33 (L) 4.22 - 5.81 MIL/uL   Hemoglobin 6.8 (LL) 13.0 - 17.0 g/dL    Comment: REPEATED TO VERIFY CONSISTENT WITH PREVIOUS RESULT CRITICAL RESULT CALLED TO, READ BACK BY AND VERIFIED WITH: Boris Lown AT 1194 09/19/15. K.PAXTON    HCT 20.1 (L) 39.0 - 52.0 %   MCV 86.3 78.0 - 100.0 fL   MCH 29.2 26.0 - 34.0 pg   MCHC 33.8 30.0 - 36.0 g/dL   RDW 17.9 (H) 11.5 - 15.5 %   Platelets 324 150 - 400 K/uL  Magnesium     Status: None   Collection Time: 09/19/15  5:20 AM  Result Value Ref Range   Magnesium 2.2 1.7 - 2.4 mg/dL  Glucose, capillary     Status: None   Collection Time: 09/19/15  6:41 AM  Result Value Ref Range   Glucose-Capillary 95 65 - 99 mg/dL  Type and screen Winn     Status: None   Collection Time:  09/19/15  9:30 AM  Result Value Ref Range   ABO/RH(D) O POS    Antibody Screen NEG    Sample Expiration 09/22/2015    Unit Number [id number]    Blood Component Type RED CELLS,LR    Unit division 00    Status of Unit ISSUED,FINAL    Transfusion Status OK TO TRANSFUSE    Crossmatch Result Compatible    Unit Number [id number]    Blood Component Type RBC LR PHER1    Unit division 00  Status of Unit ISSUED,FINAL    Transfusion Status OK TO TRANSFUSE    Crossmatch Result Compatible   Prepare RBC     Status: None   Collection Time: 09/19/15  9:30 AM  Result Value Ref Range   Order Confirmation ORDER PROCESSED BY BLOOD BANK   ABO/Rh     Status: None   Collection Time: 09/19/15  9:30 AM  Result Value Ref Range   ABO/RH(D) O POS   Renal function panel     Status: Abnormal   Collection Time: 09/19/15 11:30 AM  Result Value Ref Range   Sodium 139 135 - 145 mmol/L   Potassium 3.1 (L) 3.5 - 5.1 mmol/L   Chloride 121 (H) 101 - 111 mmol/L   CO2 14 (L) 22 - 32 mmol/L   Glucose, Bld 114 (H) 65 - 99 mg/dL   BUN <5 (L) 6 - 20 mg/dL   Creatinine, Ser 1.01 0.61 - 1.24 mg/dL   Calcium 7.5 (L) 8.9 - 10.3 mg/dL   Phosphorus 1.1 (L) 2.5 - 4.6 mg/dL   Albumin 2.4 (L) 3.5 - 5.0 g/dL   GFR calc non Af Amer >60 >60 mL/min   GFR calc Af Amer >60 >60 mL/min    Comment: (NOTE) The eGFR has been calculated using the CKD EPI equation. This calculation has not been validated in all clinical situations. eGFR's persistently <60 mL/min signify possible Chronic Kidney Disease.    Anion gap 4 (L) 5 - 15  Glucose, capillary     Status: Abnormal   Collection Time: 09/19/15 11:41 AM  Result Value Ref Range   Glucose-Capillary 109 (H) 65 - 99 mg/dL  Glucose, capillary     Status: Abnormal   Collection Time: 09/19/15  6:47 PM  Result Value Ref Range   Glucose-Capillary 105 (H) 65 - 99 mg/dL  Hemoglobin and hematocrit, blood     Status: Abnormal   Collection Time: 09/19/15  8:48 PM  Result Value  Ref Range   Hemoglobin 8.8 (L) 13.0 - 17.0 g/dL    Comment: DELTA CHECK NOTED POST TRANSFUSION SPECIMEN    HCT 26.1 (L) 39.0 - 52.0 %  Renal function panel     Status: Abnormal   Collection Time: 09/19/15 10:00 PM  Result Value Ref Range   Sodium 139 135 - 145 mmol/L   Potassium 3.3 (L) 3.5 - 5.1 mmol/L   Chloride 121 (H) 101 - 111 mmol/L   CO2 14 (L) 22 - 32 mmol/L   Glucose, Bld 123 (H) 65 - 99 mg/dL   BUN <5 (L) 6 - 20 mg/dL   Creatinine, Ser 0.93 0.61 - 1.24 mg/dL   Calcium 7.4 (L) 8.9 - 10.3 mg/dL   Phosphorus 2.7 2.5 - 4.6 mg/dL   Albumin 2.5 (L) 3.5 - 5.0 g/dL   GFR calc non Af Amer >60 >60 mL/min   GFR calc Af Amer >60 >60 mL/min    Comment: (NOTE) The eGFR has been calculated using the CKD EPI equation. This calculation has not been validated in all clinical situations. eGFR's persistently <60 mL/min signify possible Chronic Kidney Disease.    Anion gap 4 (L) 5 - 15  Glucose, capillary     Status: Abnormal   Collection Time: 09/19/15 10:42 PM  Result Value Ref Range   Glucose-Capillary 151 (H) 65 - 99 mg/dL  Protime-INR     Status: Abnormal   Collection Time: 09/20/15  3:58 AM  Result Value Ref Range   Prothrombin Time 15.6 (H) 11.6 -  15.2 seconds   INR 1.22 0.00 - 1.49  CBC     Status: Abnormal   Collection Time: 09/20/15  3:58 AM  Result Value Ref Range   WBC 11.0 (H) 4.0 - 10.5 K/uL   RBC 3.25 (L) 4.22 - 5.81 MIL/uL   Hemoglobin 9.6 (L) 13.0 - 17.0 g/dL   HCT 27.6 (L) 39.0 - 52.0 %   MCV 84.9 78.0 - 100.0 fL   MCH 29.5 26.0 - 34.0 pg   MCHC 34.8 30.0 - 36.0 g/dL   RDW 17.4 (H) 11.5 - 15.5 %   Platelets 285 150 - 400 K/uL  Magnesium     Status: Abnormal   Collection Time: 09/20/15  3:58 AM  Result Value Ref Range   Magnesium 1.6 (L) 1.7 - 2.4 mg/dL  Renal function panel     Status: Abnormal   Collection Time: 09/20/15  3:58 AM  Result Value Ref Range   Sodium 139 135 - 145 mmol/L   Potassium 3.2 (L) 3.5 - 5.1 mmol/L   Chloride 119 (H) 101 - 111  mmol/L   CO2 14 (L) 22 - 32 mmol/L   Glucose, Bld 93 65 - 99 mg/dL   BUN <5 (L) 6 - 20 mg/dL   Creatinine, Ser 0.86 0.61 - 1.24 mg/dL   Calcium 7.4 (L) 8.9 - 10.3 mg/dL   Phosphorus 3.4 2.5 - 4.6 mg/dL   Albumin 2.5 (L) 3.5 - 5.0 g/dL   GFR calc non Af Amer >60 >60 mL/min   GFR calc Af Amer >60 >60 mL/min    Comment: (NOTE) The eGFR has been calculated using the CKD EPI equation. This calculation has not been validated in all clinical situations. eGFR's persistently <60 mL/min signify possible Chronic Kidney Disease.    Anion gap 6 5 - 15  Glucose, capillary     Status: None   Collection Time: 09/20/15  6:21 AM  Result Value Ref Range   Glucose-Capillary 86 65 - 99 mg/dL  Glucose, capillary     Status: Abnormal   Collection Time: 09/20/15 11:33 AM  Result Value Ref Range   Glucose-Capillary 122 (H) 65 - 99 mg/dL   Comment 1 Notify RN    No results found.     Medical Problem List and Plan: 1. Functional deficits secondary to debilitation related to Klebsiella pneumonia bacteremia HIV/right AKA/small bowel obstruction/upper GI bleed  2.  DVT Prophylaxis/Anticoagulation/history DVT: Eliquis 10 mg twice a day 7 days then 5 mg twice a day 3. Pain Management: Oxycodone as needed. Monitor with increased mobility  4. Acute on chronic anemia. Follow-up CBC . Chronic Coumadin remains on hold  5. Neuropsych: This patient is  capable of making decisions on his  own behalf. 6. Skin/Wound Care/stage II pressure ulcer: Routine skin checks . Foam dressing as recommended by wound care nurse 7. Fluids/Electrolytes/Nutrition: Routine I&O with follow-up chemistries 8. Acute on chronic kidney disease/hyponatremia/hypokalemia. Likely secondary to ATN. Follow-up renal services 9. HIV. CD4 decreased from 700 to  219. Antivirals have been resumed.   Post Admission Physician Evaluation: 1. Functional deficits secondary to debilitation related to Klebsiella pneumonia bacteremia,  small bowel  obstruction and upper GI bleed  Wit hx of HIV/right AKA. 2. Patient is admitted to receive collaborative, interdisciplinary care between the physiatrist, rehab nursing staff, and therapy team. 3. Patient's level of medical complexity and substantial therapy needs in context of that medical necessity cannot be provided at a lesser intensity of care such as a SNF. 4.  Patient has experienced substantial functional loss from his/her baseline which was documented above under the "Functional History" and "Functional Status" headings.  Judging by the patient's diagnosis, physical exam, and functional history, the patient has potential for functional progress which will result in measurable gains while on inpatient rehab.  These gains will be of substantial and practical use upon discharge  in facilitating mobility and self-care at the household level. 5. Physiatrist will provide 24 hour management of medical needs as well as oversight of the therapy plan/treatment and provide guidance as appropriate regarding the interaction of the two. 6. 24 hour rehab nursing will assist with bladder management, bowel management, safety, skin/wound care, disease management, medication administration, pain management and patient education and help integrate therapy concepts, techniques,education, etc. 7. PT will assess and treat for/with: Lower extremity strength, range of motion, stamina, balance, functional mobility, safety, adaptive techniques and equipment, woundcare, coping skills, pain control, education.   Goals are: Mod I. 8. OT will assess and treat for/with: ADL's, functional mobility, safety, upper extremity strength, adaptive techniques and equipment, wound mgt, ego support, and community reintegration.   Goals are: Mod I. Therapy may proceed with showering this patient. 9. Case Management and Social Worker will assess and treat for psychological issues and discharge planning. 10. Team conference will be held weekly  to assess progress toward goals and to determine barriers to discharge. 11. Patient will receive at least 3 hours of therapy per day at least 5 days per week. 12. ELOS: 5-7 days.       13. Prognosis:  good   Delice Lesch, MD  09/20/2015

## 2015-09-22 NOTE — Progress Notes (Signed)
Pt refusing to take medications at this time. Will attempt to give meds again. Will continue to monitor.

## 2015-09-22 NOTE — Progress Notes (Signed)
Called and left message for patients daughter that patient had been transferred to [facility].

## 2015-09-22 NOTE — Progress Notes (Signed)
Rehab admissions - I received a call from social worker saying patient ready for inpatient rehab admission today.  Bed available and can admit to acute inpatient rehab today if okay with attending MD.  Call me for questions.  [phone]

## 2015-09-22 NOTE — Progress Notes (Signed)
Patient information reviewed and entered into eRehab system by Hildagard Sobecki, RN, CRRN, PPS Coordinator.  Information including medical coding and functional independence measure will be reviewed and updated through discharge.     Per nursing patient was given "Data Collection Information Summary for Patients in Inpatient Rehabilitation Facilities with attached "Privacy Act Statement-Health Care Records" upon admission.  

## 2015-09-22 NOTE — Progress Notes (Addendum)
CSW spoke with [facility] RN Liaison who indicated that patient would be accepted to CIR today.  Ok per MD for d/c today to CIR. Nursing and unit  RNCM notified.  No further CSW needs identified. CSW signing off. Lorie Phenix. Pauline Good, [place]

## 2015-09-23 DIAGNOSIS — Z89619 Acquired absence of unspecified leg above knee: Secondary | ICD-10-CM

## 2015-09-23 LAB — COMPREHENSIVE METABOLIC PANEL
ALBUMIN: 3 g/dL — AB (ref 3.5–5.0)
ALK PHOS: 165 U/L — AB (ref 38–126)
ALT: 34 U/L (ref 17–63)
AST: 29 U/L (ref 15–41)
Anion gap: 7 (ref 5–15)
BUN: 14 mg/dL (ref 6–20)
CALCIUM: 8.4 mg/dL — AB (ref 8.9–10.3)
CO2: 14 mmol/L — AB (ref 22–32)
CREATININE: 1.08 mg/dL (ref 0.61–1.24)
Chloride: 113 mmol/L — ABNORMAL HIGH (ref 101–111)
GFR calc non Af Amer: >60 mL/min (ref >60)
GLUCOSE: 69 mg/dL (ref 65–99)
Potassium: 3.4 mmol/L — ABNORMAL LOW (ref 3.5–5.1)
SODIUM: 134 mmol/L — AB (ref 135–145)
Total Bilirubin: 0.6 mg/dL (ref 0.3–1.2)
Total Protein: 6.8 g/dL (ref 6.5–8.1)

## 2015-09-23 LAB — CBC WITH DIFFERENTIAL/PLATELET
Basophils Absolute: 0 10*3/uL (ref 0.0–0.1)
Basophils Relative: 0 %
EOS ABS: 0 10*3/uL (ref 0.0–0.7)
Eosinophils Relative: 0 %
HCT: 30 % — ABNORMAL LOW (ref 39.0–52.0)
HEMOGLOBIN: 10 g/dL — AB (ref 13.0–17.0)
LYMPHS ABS: 5 10*3/uL — AB (ref 0.7–4.0)
Lymphocytes Relative: 39 %
MCH: 29.1 pg (ref 26.0–34.0)
MCHC: 33.3 g/dL (ref 30.0–36.0)
MCV: 87.2 fL (ref 78.0–100.0)
Monocytes Absolute: 0.8 10*3/uL (ref 0.1–1.0)
Monocytes Relative: 6 %
NEUTROS PCT: 55 %
Neutro Abs: 7.2 10*3/uL (ref 1.7–7.7)
Platelets: 355 10*3/uL (ref 150–400)
RBC: 3.44 MIL/uL — AB (ref 4.22–5.81)
RDW: 18.5 % — ABNORMAL HIGH (ref 11.5–15.5)
WBC: 13 10*3/uL — AB (ref 4.0–10.5)

## 2015-09-23 LAB — PROTIME-INR
INR: 1.21 (ref 0.00–1.49)
PROTHROMBIN TIME: 15.5 s — AB (ref 11.6–15.2)

## 2015-09-23 MED ORDER — SODIUM CHLORIDE 0.9 % IJ SOLN
10.0000 mL | INTRAMUSCULAR | Status: DC | PRN
  Administered 2015-09-23 – 2015-09-26 (×3): 30 mL
  Filled 2015-09-23 (×2): qty 40

## 2015-09-23 NOTE — Plan of Care (Signed)
Problem: RH PAIN MANAGEMENT Goal: RH STG PAIN MANAGED AT OR BELOW PT'S PAIN GOAL <5  Outcome: Not Progressing Reports pain as 8

## 2015-09-23 NOTE — Progress Notes (Signed)
[place] PHYSICAL MEDICINE & REHABILITATION     PROGRESS NOTE    Subjective/Complaints: No specific complaints. Asked if daughter should bring powered w/c in ROS limited by mood/cognition  Objective: Vital Signs: Blood pressure 115/65, pulse 101, temperature 98.6 F (37 C), temperature source Oral, resp. rate 18, height 6' (1.829 m), weight 44.316 kg (97 lb 11.2 oz), SpO2 100 %. No results found.  Recent Labs  09/22/15 0500 09/23/15 0541  WBC 11.4* 13.0*  HGB 9.6* 10.0*  HCT 29.0* 30.0*  PLT 328 355    Recent Labs  09/22/15 0500 09/23/15 0541  NA 137 134*  K 3.8 3.4*  CL 119* 113*  GLUCOSE 107* 69  BUN 7 14  CREATININE 0.96 1.08  CALCIUM 8.2* 8.4*   CBG (last 3)   Recent Labs  09/22/15 0426 09/22/15 0538 09/22/15 1204  GLUCAP 73 143* 155*    Wt Readings from Last 3 Encounters:  09/23/15 44.316 kg (97 lb 11.2 oz)  09/22/15 41.776 kg (92 lb 1.6 oz)  08/06/15 53.615 kg (118 lb 3.2 oz)    Physical Exam:  Constitutional: No distress.  Cachectic   HENT:  Head: Normocephalic and atraumatic.  Eyes: Conjunctivae and EOM are normal.  Neck: Normal range of motion. No thyromegaly present.  Cardiovascular: Normal rate and regular rhythm.  Respiratory: Effort normal and breath sounds normal.  GI: Soft. Bowel sounds are normal. He exhibits no distension.  Musculoskeletal: He exhibits no edema or tenderness.  Right AKA  Neurological: He is alert and oriented to person and place.  Sensation intact to light touch throughout Motor: B/l UE 4/5 proximal to distal LLE: 4+/5 proximal to distal Right hip flexion 4/5.  Skin: Skin is warm and dry.  AKA site is healed. Sacral ulcer dressed Psychiatric: Normal behavior. Cognition and memory are impaired  Assessment/Plan: 1. Weakness/ debility related to multiple medical issues/HIV which require 3+ hours per day of interdisciplinary therapy in a comprehensive inpatient rehab setting. Physiatrist is  providing close team supervision and 24 hour management of active medical problems listed below. Physiatrist and rehab team continue to assess barriers to discharge/monitor patient progress toward functional and medical goals.  Function:  Bathing Bathing position      Bathing parts      Bathing assist        Upper Body Dressing/Undressing Upper body dressing                    Upper body assist        Lower Body Dressing/Undressing Lower body dressing                                  Lower body assist        Toileting Toileting          Toileting assist     Transfers Chair/bed transfer             Locomotion Ambulation           Wheelchair          Cognition Comprehension    Expression    Social Interaction    Problem Solving    Memory     Medical Problem List and Plan: 1. Debilitation/weakness related to Klebsiella pneumonia bacteremia HIV/right AKA/small bowel obstruction/upper GI bleed   -CIR therapies 2. DVT Prophylaxis/Anticoagulation/history DVT: Eliquis 10 mg twice a day 7 days then 5 mg twice a  day 3. Pain Management: Oxycodone as needed. Monitor with increased mobility  4. Acute on chronic anemia. Follow-up CBC today---hgb 10 . Chronic Coumadin remains on hold  5. Neuropsych: This patient is capable of making decisions on his own behalf. 6. Skin/Wound Care/stage II pressure ulcer: Routine skin checks . Foam dressing as recommended by wound care nurse  -improve nutrition 7. Fluids/Electrolytes/Nutrition: Routine I&O with follow-up chemistries today. I personally reviewed the patient's labs today.  8. Acute on chronic kidney disease/hyponatremia/hypokalemia. Likely secondary to ATN. Follow-up renal services 9. HIV. CD4 decreased from 700 to 219. Antivirals have been resumed.  -wbc's elevated to 13k today.  LOS (Days) 1 A FACE TO FACE EVALUATION WAS PERFORMED  [name] T 09/23/2015 8:27 AM

## 2015-09-24 ENCOUNTER — Inpatient Hospital Stay (HOSPITAL_COMMUNITY): Payer: Medicare Other | Admitting: Physical Therapy

## 2015-09-24 ENCOUNTER — Inpatient Hospital Stay (HOSPITAL_COMMUNITY): Payer: Medicare Other | Admitting: *Deleted

## 2015-09-24 ENCOUNTER — Inpatient Hospital Stay (HOSPITAL_COMMUNITY): Payer: Medicare Other | Admitting: Occupational Therapy

## 2015-09-24 LAB — PROTIME-INR
INR: 1.18 (ref 0.00–1.49)
PROTHROMBIN TIME: 15.2 s (ref 11.6–15.2)

## 2015-09-24 MED ORDER — WARFARIN SODIUM 3 MG PO TABS
3.0000 mg | ORAL_TABLET | Freq: Every day | ORAL | Status: DC
  Filled 2015-09-24: qty 1

## 2015-09-24 MED ORDER — HYDROCERIN EX CREA
TOPICAL_CREAM | Freq: Two times a day (BID) | CUTANEOUS | Status: DC | PRN
  Administered 2015-09-27: 16:00:00 via TOPICAL
  Filled 2015-09-24: qty 113

## 2015-09-24 MED ORDER — COLLAGENASE 250 UNIT/GM EX OINT
TOPICAL_OINTMENT | Freq: Every day | CUTANEOUS | Status: DC
  Administered 2015-09-24 – 2015-09-26 (×3): via TOPICAL
  Administered 2015-09-27: 1 via TOPICAL
  Administered 2015-09-28: 16:00:00 via TOPICAL
  Filled 2015-09-24: qty 30

## 2015-09-24 MED ORDER — WARFARIN SODIUM 3 MG PO TABS: 3.0000 mg | ORAL_TABLET | Freq: Every day | ORAL | Status: DC

## 2015-09-24 MED ORDER — WARFARIN SODIUM 3 MG PO TABS
3.0000 mg | ORAL_TABLET | Freq: Once | ORAL | Status: AC
  Administered 2015-09-24: 3 mg via ORAL
  Filled 2015-09-24: qty 1

## 2015-09-24 MED ORDER — WARFARIN - PHYSICIAN DOSING INPATIENT: Freq: Every day | Status: DC

## 2015-09-24 MED ORDER — ENOXAPARIN SODIUM 30 MG/0.3ML ~~LOC~~ SOLN
30.0000 mg | SUBCUTANEOUS | Status: DC
  Administered 2015-09-26: 30 mg via SUBCUTANEOUS
  Filled 2015-09-24 (×4): qty 0.3

## 2015-09-24 MED ORDER — TRAZODONE HCL 50 MG PO TABS
50.0000 mg | ORAL_TABLET | Freq: Every evening | ORAL | Status: DC | PRN
  Administered 2015-09-24: 50 mg via ORAL
  Filled 2015-09-24: qty 1

## 2015-09-24 MED ORDER — WARFARIN - PHARMACIST DOSING INPATIENT
Freq: Every day | Status: DC
  Administered 2015-09-25 – 2015-09-28 (×3)

## 2015-09-24 MED ORDER — DOLUTEGRAVIR SODIUM 50 MG PO TABS
50.0000 mg | ORAL_TABLET | Freq: Every day | ORAL | Status: DC
  Administered 2015-09-24 – 2015-09-28 (×5): 50 mg via ORAL
  Filled 2015-09-24 (×7): qty 1

## 2015-09-24 MED ORDER — MEGESTROL ACETATE 400 MG/10ML PO SUSP
400.0000 mg | Freq: Two times a day (BID) | ORAL | Status: DC
  Administered 2015-09-24 – 2015-09-28 (×8): 400 mg via ORAL
  Filled 2015-09-24 (×13): qty 10

## 2015-09-24 NOTE — IPOC Note (Signed)
Overall Plan of Care [facility]) Patient Details Name: [name] MRN: [id number] DOB: February 18, 1961  Admitting Diagnosis: Debility  Hospital Problems: Active Problems:   Debilitated   AIDS ([place])   AKI (acute kidney injury) ([place])   Acute blood loss anemia   History of above knee amputation ([place])   Hyponatremia     Functional Problem List: Nursing Nutrition, Pain, Safety  PT Balance, Endurance, Nutrition, Pain, Safety, Sensory, Skin Integrity  OT Balance, Endurance, Pain, Safety, Sensory, Skin Integrity  SLP    TR         Basic ADL's: OT Grooming, Bathing, Dressing, Toileting     Advanced  ADL's: OT Simple Meal Preparation     Transfers: PT Bed Mobility, Bed to Chair, Car, Manufacturing systems engineer, Metallurgist: PT Ambulation, Emergency planning/management officer, Stairs     Additional Impairments: OT Fuctional Use of Upper Extremity  SLP        TR      Anticipated Outcomes Item Anticipated Outcome  Self Feeding independent  Swallowing      Basic self-care  mod I  Toileting  mod I   Bathroom Transfers mod I  Bowel/Bladder  Mod I  Transfers  Mod I  Locomotion  S with RW  Communication     Cognition     Pain  <5  Safety/Judgment  Supervision   Therapy Plan: PT Intensity: Minimum of 1-2 x/day ,45 to 90 minutes PT Frequency: 5 out of 7 days PT Duration Estimated Length of Stay: 7-10 days OT Intensity: Minimum of 1-2 x/day, 45 to 90 minutes OT Frequency: 5 out of 7 days OT Duration/Estimated Length of Stay: 7-10 days         Team Interventions: Nursing Interventions Disease Management/Prevention, Medication Management, Skin Care/Wound Management  PT interventions Ambulation/gait training, Training and development officer, Cognitive remediation/compensation, Community reintegration, Discharge planning, Disease management/prevention, DME/adaptive equipment instruction, Functional mobility training, Neuromuscular re-education, Pain management, Patient/family  education, Psychosocial support, Skin care/wound management, Splinting/orthotics, Stair training, Therapeutic Activities, Therapeutic Exercise, UE/LE Strength taining/ROM, Wheelchair propulsion/positioning  OT Interventions Training and development officer, Discharge planning, Disease mangement/prevention, DME/adaptive equipment instruction, Functional mobility training, Pain management, Patient/family education, Psychosocial support, Self Care/advanced ADL retraining, Skin care/wound managment, Therapeutic Activities, Therapeutic Exercise, UE/LE Coordination activities, Wheelchair propulsion/positioning  SLP Interventions    TR Interventions    SW/CM Interventions Discharge Planning, Psychosocial Support, Patient/Family Education    Team Discharge Planning: Destination: PT-Home ,OT- Home , SLP-  Projected Follow-up: PT-Home health PT, 24 hour supervision/assistance, OT-   , SLP-  Projected Equipment Needs: PT-None recommended by PT, To be determined (Pt may need pressure-relief cushion), OT- None recommended by OT (at this time, pt report he has needed DME), SLP-  Equipment Details: PT-Pt has power and manual WC, RW, SPC, and RLE prosthesis, OT-  Patient/family involved in discharge planning: PT- Patient,  OT-Patient, SLP-   MD ELOS: 7-10 days Medical Rehab Prognosis:  Excellent Assessment: The patient has been admitted for CIR therapies with the diagnosis of debility. The team will be addressing functional mobility, strength, stamina, balance, safety, adaptive techniques and equipment, self-care, bowel and bladder mgt, patient and caregiver education, pain mgt, ego support, w/c assessment, prosthetic adjustments. Goals have been set at mod I to supervision for mobility and self-care. Prosthesis may need adjustment while here as well.    Meredith Staggers, MD, FAAPMR      See Team Conference Notes for weekly updates to the plan of care

## 2015-09-24 NOTE — Evaluation (Signed)
Occupational Therapy Assessment and Plan & Session Note  Patient Details  Name: [name] MRN: [id number] Date of Birth: [birth year]  OT Diagnosis: acute pain and muscle weakness (generalized) Rehab Potential: Rehab Potential (ACUTE ONLY): Good ELOS: 7-10 days   Today's Date: 09/24/2015 OT Individual Time: 0800-0930 OT Individual Time Calculation (min): 90 min     Problem List:  Patient Active Problem List   Diagnosis Date Noted  . Debilitated 09/22/2015  . Renal tubular acidosis   . AIDS ([place])   . AKI (acute kidney injury) ([place])   . Acute blood loss anemia   . History of above knee amputation ([place])   . Hyponatremia   . Hypophosphatemia   . SBO (small bowel obstruction) ([place])   . Hyperosmolality and/or hypernatremia   . Hypomagnesemia   . Pressure ulcer 09/13/2015  . Protein-calorie malnutrition, severe 09/13/2015  . Abdominal pain   . Pneumonia due to Klebsiella pneumoniae ([place])   . Septic shock ([place]) 09/10/2015  . Gram-negative bacteremia ([place]) 09/10/2015  . Polyuria 08/06/2015  . Prediabetes 08/06/2015  . Hematuria 08/06/2015  . UTI (urinary tract infection) 05/14/2015  . Avascular necrosis of femoral head ([place]) 05/14/2015  . Left adrenal mass ([place]) 05/14/2015  . Hypokalemia 10/08/2014  . CKD (chronic kidney disease) 02/10/2014  . Hyperglycemia 12/29/2013  . Hyperbilirubinemia 12/29/2013  . Unintentional weight loss 11/04/2013  . Left hip pain 03/09/2013  . Preventative health care 03/09/2013  . Phantom limb syndrome with pain ([place]) 03/21/2012  . Long term current use of anticoagulant 11/20/2010  . ERECTILE DYSFUNCTION, ORGANIC 04/12/2010  . GERD 08/23/2009  . METHICILLIN RESISTANT STAPHYLOCOCCUS AUREUS INFECTION 08/05/2009  . ENLARGEMENT OF LYMPH NODES 07/20/2009  . 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 HYPOGONADISM 07/15/2009  . DEFICIENCY OF OTHER VITAMINS 07/15/2009  . PULMONARY NODULE 07/15/2009  . Status post above knee amputation  ([place]) 07/15/2009  . Human immunodeficiency virus (HIV) disease ([place]) 11/10/2006  . SHINGLES, RECURRENT 11/10/2006  . HEPATITIS B 11/10/2006  . DYSLIPIDEMIA 11/10/2006  . PSYCHOSIS 11/10/2006  . Alcohol abuse 11/10/2006  . CIGARETTE SMOKER 11/10/2006  . PERIPHERAL NEUROPATHY 11/10/2006  . ALLERGIC RHINITIS 11/10/2006  . DENTAL CARIES 11/10/2006    Past Medical History:  Past Medical History  Diagnosis Date  . History of chicken pox   . Hyperlipidemia   . History of DVT of lower extremity     right  . Left hip pain 03/09/2013  . Pneumonia   . Chronic kidney disease   . Protein malnutrition ([place]) 08/2015  . HIV (human immunodeficiency virus infection) ([place])    Past Surgical History:  Past Surgical History  Procedure Laterality Date  . Appendectomy  1962  . Leg amputation above knee  2010    right leg    Assessment & Plan Clinical Impression: A 54 y.o. 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 Patient lives with his 22 year old daughter independent with assistive device prior to admission and prosthesis. Pt's daughter is planning on going to work and is in the process of trying to obtain some assistance for her father. One level apt with ramped entrance. Presented in transfer from [facility] 09/10/2015 with dark tarry stools and hematemesis. INR greater than 10 and reversed. Found to be acidotic, hypernatremic/hypokalemic required intubation. Chest x-ray bilateral pneumonia question aspiration. Placed on broad-spectrum antibiotics. He was transfused. Blood cultures positive for Klebsiella. KUB of the abdomen showed partial small bowel obstruction requiring  nasogastric tube. Nephrology services follow-up for hypernatremia as well as hypokalemia/acute renal insufficiency with supplement added and latest sodium improved as well as hypokalemia reversed. Hemoglobin remained stable 7.7-9.6. Subcutaneous Lovenox for DVT prophylaxis.  Remains on a full liquid diet and advance to mechanical soft nasogastric tube removed. Wound care nurse follow-up for sacral wound with dressing changes as advised. Plan to resume anti-retroviral regimen once by mouth intake improved. Physical therapy evaluation completed and ongoing noting debilitation with recommendations of physical medicine rehabilitation consult. Patient transferred to CIR on 09/22/2015 .    Patient currently requires min with basic self-care skills and IADL secondary to muscle weakness.  Prior to hospitalization, patient could complete ADLs at mod I level.  Patient will benefit from skilled intervention to increase independence with basic self-care skills prior to discharge home with daughter.  Anticipate patient will require intermittent supervision and additional OT TBD.  OT - End of Session Activity Tolerance: Decreased this session Endurance Deficit: Yes OT Assessment Rehab Potential (ACUTE ONLY): Good Barriers to Discharge:  (None known at this time) OT Patient demonstrates impairments in the following area(s): Balance;Endurance;Pain;Safety;Sensory;Skin Integrity OT Basic ADL's Functional Problem(s): Grooming;Bathing;Dressing;Toileting OT Advanced ADL's Functional Problem(s): Simple Meal Preparation OT Transfers Functional Problem(s): Toilet;Tub/Shower OT Additional Impairment(s): Fuctional Use of Upper Extremity OT Plan OT Intensity: Minimum of 1-2 x/day, 45 to 90 minutes OT Frequency: 5 out of 7 days OT Duration/Estimated Length of Stay: 7-10 days OT Treatment/Interventions: Balance/vestibular training;Discharge planning;Disease Lawyer;Functional mobility training;Pain management;Patient/family education;Psychosocial support;Self Care/advanced ADL retraining;Skin care/wound managment;Therapeutic Activities;Therapeutic Exercise;UE/LE Coordination activities;Wheelchair propulsion/positioning OT Self Feeding Anticipated  Outcome(s): independent OT Basic Self-Care Anticipated Outcome(s): mod I OT Toileting Anticipated Outcome(s): mod I OT Bathroom Transfers Anticipated Outcome(s): mod I OT Recommendation Recommendations for Other Services: Neuropsych consult Patient destination: Home Equipment Recommended: None recommended by OT (at this time, pt report he has needed DME)   Skilled Therapeutic Intervention 1:1 OT evaluation completed. Focused skilled intervention on BUE strengthening and endurance using ergometer for 8 minutes, toilet transfer, toileting, walk-in shower transfer using tub transfer bench, UB/LB bathing, UB/LB dressing, and grooming tasks seated at sink in power w/c. At end of session, left patient seated in w/c with quick release belt donned and all needs within reach.   OT Evaluation Precautions/Restrictions  Precautions Precautions: Fall Precaution Comments: watch HR Restrictions Weight Bearing Restrictions: Yes   General Chart Reviewed: Yes Family/Caregiver Present: No   Vital Signs Therapy Vitals Temp: 98.3 F (36.8 C) Temp Source: Oral Pulse Rate: (!) 111 Resp: 18 BP: 108/69 mmHg Patient Position (if appropriate): Sitting Oxygen Therapy SpO2: 100 % O2 Device: Not Delivered  Pain Pain Assessment Pain Assessment: 0-10 Pain Score: 7  Pain Type: Acute pain Pain Location: Sacrum (Pt reports it is a sore on his sacrum he's had since he has been "sick") Pain Orientation: Mid Pain Descriptors / Indicators: Sore Pain Onset: On-going Pain Intervention(s): Repositioned;RN made aware Multiple Pain Sites: Yes 2nd Pain Site Pain Score: 6 Pain Type: Phantom pain (pt reports pain as phantom pain, but points to end of residual limb) Pain Location: Leg Pain Orientation: Right Pain Descriptors / Indicators: Aching Pain Onset: On-going ("It don't stop") Pain Intervention(s): Repositioned;RN made aware   Home Living/Prior Functioning Home Living Available Help at Discharge:  Family, Available 24 hours/day Type of Home: Apartment Home Access: Stairs to enter, Ramped entrance Entrance Stairs-Number of Steps: 1 into front door, ramped entrance to back door (pt reports he normally goes into back door) Home Layout: One  level Bathroom Shower/Tub:  (unable to state) Bathroom Toilet: Standard Additional Comments: Pt has a prosthesis for RLE, tub transfer bench, BSC, hand held shower, power w/c, RW, cane  Lives With: Daughter IADL History Homemaking Responsibilities: No Current License: Yes Mode of Transportation: [employer] Leisure and Hobbies: "ain't nothing to do for fun nomore" IADL Comments: Pt reports his daughter cooks and cleans most of the time. Pt reports he drives his truck or van to the store and he does most of the shopping Prior Function Level of Independence: Requires assistive device for independence  Able to Take Stairs?: No Driving: Yes Leisure: Hobbies-no Comments: Pt reports he used to love to swim    ADL See FUNCTION tab  Vision/Perception  Vision- History Baseline Vision/History: Wears glasses Wears Glasses: At all times ("I need them all the time, but I ran them over and haven't gotten them fixed") Vision- Assessment Vision Assessment?: No apparent visual deficits    Cognition Overall Cognitive Status: Within Functional Limits for tasks assessed Arousal/Alertness: Awake/alert Comments: Pt with some difficulty answering home living questions, but overal presents [facility]   Sensation Sensation Light Touch: Impaired by gross assessment Stereognosis: Impaired by gross assessment Hot/Cold: Impaired by gross assessment Proprioception: Impaired by gross assessment Additional Comments: Pt reports that he has decreased sensation hand to fingertips Coordination Gross Motor Movements are Fluid and Coordinated: Yes (pt with generalized weakness) Fine Motor Movements are Fluid and Coordinated: Yes (pt with generalized weakness)   Motor   Motor Motor: Within Functional Limits   Trunk/Postural Assessment  Cervical Assessment Cervical Assessment: Within Functional Limits Thoracic Assessment Thoracic Assessment: Within Functional Limits Lumbar Assessment Lumbar Assessment: Within Functional Limits Postural Control Postural Control: Within Functional Limits   Balance Balance Balance Assessed: Yes Dynamic Sitting Balance Dynamic Sitting - Balance Support: No upper extremity supported (LLE supported) Dynamic Sitting - Level of Assistance: 5: Stand by assistance Dynamic Sitting Balance - Compensations: Pt able to don right prosthesis with no upper extremeity support and left leg supported. Supervision for safety. Static Standing Balance Static Standing - Balance Support: Bilateral upper extremity supported;During functional activity Static Standing - Level of Assistance: 4: Min assist Static Standing - Comment/# of Minutes: Cues for safety with hand placement, min assist for safety and to maintain balance   Extremity/Trunk Assessment RUE Assessment RUE Assessment: Within Functional Limits (grossly 4+5 strength) LUE Assessment LUE Assessment: Exceptions to WFL LUE AROM (degrees) Overall AROM Left Upper Extremity: Deficits (pt only able to actively flex shoulder about 90*, more than 90* caused increased pain) LUE Strength LUE Overall Strength: Deficits (grossly 3+/5)   See Function Navigator for Current Functional Status.   Refer to Care Plan for Long Term Goals  Recommendations for other services: Neuropsych  Discharge Criteria: Patient will be discharged from OT if patient refuses treatment 3 consecutive times without medical reason, if treatment goals not met, if there is a change in medical status, if patient makes no progress towards goals or if patient is discharged from hospital.  The above assessment, treatment plan, treatment alternatives and goals were discussed and mutually agreed upon: by  patient  [name] , MS, OTR/L, CLT Pager: [phone]  09/24/2015, 10:31 AM

## 2015-09-24 NOTE — Evaluation (Signed)
Physical Therapy Assessment and Plan  Patient Details  Name: Oluwanifemi Susman MRN: [id number] Date of Birth: 1960/11/27  PT Diagnosis: Difficulty walking, Impaired sensation and Muscle weakness Rehab Potential: Good ELOS: 7-10 days   Today's Date: 09/24/2015 PT Individual Time: 1038-1208 PT Individual Time Calculation (min): 90 min    Problem List:  Patient Active Problem List   Diagnosis Date Noted  . Debilitated 09/22/2015  . Renal tubular acidosis   . AIDS ([place])   . AKI (acute kidney injury) ([place])   . Acute blood loss anemia   . History of above knee amputation ([place])   . Hyponatremia   . Hypophosphatemia   . SBO (small bowel obstruction) ([place])   . Hyperosmolality and/or hypernatremia   . Hypomagnesemia   . Pressure ulcer 09/13/2015  . Protein-calorie malnutrition, severe 09/13/2015  . Abdominal pain   . Pneumonia due to Klebsiella pneumoniae ([place])   . Septic shock ([place]) 09/10/2015  . Gram-negative bacteremia ([place]) 09/10/2015  . Polyuria 08/06/2015  . Prediabetes 08/06/2015  . Hematuria 08/06/2015  . UTI (urinary tract infection) 05/14/2015  . Avascular necrosis of femoral head ([place]) 05/14/2015  . Left adrenal mass ([place]) 05/14/2015  . Hypokalemia 10/08/2014  . CKD (chronic kidney disease) 02/10/2014  . Hyperglycemia 12/29/2013  . Hyperbilirubinemia 12/29/2013  . Unintentional weight loss 11/04/2013  . Left hip pain 03/09/2013  . Preventative health care 03/09/2013  . Phantom limb syndrome with pain ([place]) 03/21/2012  . Long term current use of anticoagulant 11/20/2010  . ERECTILE DYSFUNCTION, ORGANIC 04/12/2010  . GERD 08/23/2009  . METHICILLIN RESISTANT STAPHYLOCOCCUS AUREUS INFECTION 08/05/2009  . ENLARGEMENT OF LYMPH NODES 07/20/2009  . Embolism and thrombosis of artery ([place]) 07/19/2009  . HYPOGONADISM 07/15/2009  . DEFICIENCY OF OTHER VITAMINS 07/15/2009  . PULMONARY NODULE 07/15/2009  . Status post above knee amputation ([place]) 07/15/2009  . Human  immunodeficiency virus (HIV) disease ([place]) 11/10/2006  . SHINGLES, RECURRENT 11/10/2006  . HEPATITIS B 11/10/2006  . DYSLIPIDEMIA 11/10/2006  . PSYCHOSIS 11/10/2006  . Alcohol abuse 11/10/2006  . CIGARETTE SMOKER 11/10/2006  . PERIPHERAL NEUROPATHY 11/10/2006  . ALLERGIC RHINITIS 11/10/2006  . DENTAL CARIES 11/10/2006    Past Medical History:  Past Medical History  Diagnosis Date  . History of chicken pox   . Hyperlipidemia   . History of DVT of lower extremity     right  . Left hip pain 03/09/2013  . Pneumonia   . Chronic kidney disease   . Protein malnutrition ([place]) 08/2015  . HIV (human immunodeficiency virus infection) ([place])    Past Surgical History:  Past Surgical History  Procedure Laterality Date  . Appendectomy  1962  . Leg amputation above knee  2010    right leg    Assessment & Plan Clinical Impression: Piper Albro is a 54 y.o. 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 Patient lives with his 25 year old daughter independent with assistive device prior to admission and prosthesis. Pt's daughter is planning on going to work and is in the process of trying to obtain some assistance for her father. One level apt with ramped entrance. Presented in transfer from [facility] 09/10/2015 with dark tarry stools and hematemesis. INR greater than 10 and reversed. Found to be acidotic, hypernatremic/hypokalemic required intubation. Chest x-ray bilateral pneumonia question aspiration. Placed on broad-spectrum antibiotics. He was transfused. Blood cultures positive for Klebsiella. KUB of the abdomen showed partial small bowel obstruction requiring nasogastric tube. Nephrology services  follow-up for hypernatremia as well as hypokalemia/acute renal insufficiency with supplement added and latest sodium improved as well as hypokalemia reversed. Hemoglobin remained stable 7.7-9.6. Subcutaneous Lovenox for DVT prophylaxis and later  discontinued with Eliquis initiated for history of DVT . Remains on a full liquid diet and advance to mechanical soft nasogastric tube removed. Wound care nurse follow-up for sacral wound with dressing changes as advised. Plan to resume anti-retroviral regimen once by mouth intake improved. Physical therapy evaluation completed and ongoing noting debilitation with recommendations of physical medicine rehabilitation consult. Patient was admitted for comprehensive rehabilitation program   Patient transferred to CIR on 09/22/2015 .   Patient currently requires mod with mobility secondary to muscle weakness and muscle joint tightness, decreased cardiorespiratoy endurance and decreased standing balance and decreased balance strategies.  Prior to hospitalization, patient was modified independent  with mobility and lived with Daughter in a [place] home.  Home access is 1 into front door, ramped entrance to back door (pt reports he normally goes into back door)Stairs to enter, Ramped entrance.  Patient will benefit from skilled PT intervention to maximize safe functional mobility, minimize fall risk and decrease caregiver burden for planned discharge home with 24 hour supervision.  Anticipate patient will benefit from follow up [place] at discharge.  PT - End of Session Activity Tolerance: Tolerates < 10 min activity with changes in vital signs Endurance Deficit: Yes Endurance Deficit Description: HR increases to 140 bpm with 5 min activity. Needs seated rest after each task PT Assessment Rehab Potential (ACUTE/IP ONLY): Good PT Patient demonstrates impairments in the following area(s): Balance;Endurance;Nutrition;Pain;Safety;Sensory;Skin Integrity PT Transfers Functional Problem(s): Bed Mobility;Bed to Chair;Car;Furniture PT Locomotion Functional Problem(s): Ambulation;Wheelchair Mobility;Stairs PT Plan PT Intensity: Minimum of 1-2 x/day ,45 to 90 minutes PT Frequency: 5 out of 7 days PT Duration Estimated  Length of Stay: 7-10 days PT Treatment/Interventions: Ambulation/gait training;Balance/vestibular training;Cognitive remediation/compensation;Community reintegration;Discharge planning;Disease management/prevention;DME/adaptive equipment instruction;Functional mobility training;Neuromuscular re-education;Pain management;Patient/family education;Psychosocial support;Skin care/wound management;Splinting/orthotics;Stair training;Therapeutic Activities;Therapeutic Exercise;UE/LE Strength taining/ROM;Wheelchair propulsion/positioning PT Transfers Anticipated Outcome(s): Mod I PT Locomotion Anticipated Outcome(s): S with RW PT Recommendation Follow Up Recommendations: Home health PT;24 hour supervision/assistance Patient destination: Home Equipment Recommended: None recommended by PT;To be determined (Pt may need pressure-relief cushion) Equipment Details: Pt has power and manual WC, RW, SPC, and RLE prosthesis  Skilled Therapeutic Intervention Pt tx initiated upon eval. Pt oriented to unit, call bell, PT POC, and principles of rehab. Pt was educated on fall risk reduction and precautions and required safety cues throughout tx. He was able to don prosthesis but it fell off during transfer. Pt instructed in transfer training for basic squat pivot transfer, sit<>stands, sitting balance, standing balance training at RW, stair training, and gait with RW. Pt required up to Mod A throughout and was limited by decreased activity tolerance. Pt performed 8 min on UE bike with 2 rest breaks for UE strengthening as well as weighted ball toss at rebounder for core and UE strengthening. Pt propelled WC x100' with S cues for safe use of DME. Pt encouraged to use manual WC as able to increase strength. WC cushion was changed to [place] for pressure relief. Pt reports using 3 stacked cushions at home and was educated on safety issues. Therapist attempted to call [employer] reguarding prosthetic fit, but closed for holiday.    PT  Evaluation Precautions/Restrictions Precautions Precautions: Fall Precaution Comments: watch HR Required Braces or Orthoses: Other Brace/Splint Other Brace/Splint: R AK prosthetic limb Restrictions Weight Bearing Restrictions: Yes General   Vital  Signs Pain pt reports discomfort at sacrum but not rating     Home Living/Prior Functioning Home Living Available Help at Discharge: Family;Available 24 hours/day Type of Home: Apartment Home Access: Stairs to enter;Ramped entrance Entrance Stairs-Number of Steps: 1 into front door, ramped entrance to back door (pt reports he normally goes into back door) Home Layout: One level Bathroom Toilet: Standard Additional Comments: Pt has a prosthesis for RLE, tub transfer bench, BSC, hand held shower, power and manual w/c, RW, cane  Lives With: Daughter Prior Function Level of Independence: Requires assistive device for independence  Able to Take Stairs?: Yes Driving: Yes Vocation: On disability Leisure: Hobbies-no Vision/Perception  Vision - History Baseline Vision: Wears glasses all the time Patient Visual Report: No change from baseline Vision - Assessment Eye Alignment: Within Functional Limits  Cognition Overall Cognitive Status: Within Functional Limits for tasks assessed Arousal/Alertness: Awake/alert Orientation Level: Oriented X4 Memory: Impaired Memory Impairment: Retrieval deficit;Decreased recall of new information Awareness: Impaired Awareness Impairment: Emergent impairment;Anticipatory impairment Problem Solving: Impaired Problem Solving Impairment: Functional basic;Functional complex Safety/Judgment: Impaired Comments: Pt with some difficulty reporting history and completing thoughts, but functional . Upon arrival, pt was sitting at end of bed with LEs around end of bed rail.  Sensation Sensation Light Touch: Appears Intact (Intact LLE, but reports tingling hands>fingers) Proprioception: Appears  Intact Coordination Gross Motor Movements are Fluid and Coordinated: Yes Fine Motor Movements are Fluid and Coordinated: Yes Motor  Motor Motor: Within Functional Limits  Mobility Bed Mobility Bed Mobility: Rolling Right;Right Sidelying to Sit;Sit to Supine Rolling Right: 6: Modified independent (Device/Increase time) Right Sidelying to Sit: 5: Supervision Sit to Supine: 5: Supervision Transfers Transfers: Yes Squat Pivot Transfers: 2: Max Risk manager Details (indicate cue type and reason): Pt neeed lifting and lowering assistance when fatigued Locomotion  Ambulation Ambulation: Yes Ambulation/Gait Assistance: 4: Min assist Ambulation Distance (Feet): 60 Feet Assistive device: Rolling walker Ambulation/Gait Assistance Details: Steady assist needed at trunk wihtout prosthesis due to poor fit currently Stairs / Additional Locomotion Stairs: Yes Stairs Assistance: 3: Mod assist Stairs Assistance Details (indicate cue type and reason): Lifting assist Stair Management Technique: Two rails;Step to pattern;Forwards;Other (comment) (No prosthesis) Number of Stairs: 8 Height of Stairs: 5 Wheelchair Mobility Wheelchair Mobility: Yes Wheelchair Assistance: 5: Careers information officer: Both upper extremities Wheelchair Parts Management: Needs assistance Distance: 100  Trunk/Postural Assessment  Cervical Assessment Cervical Assessment: Within Functional Limits Thoracic Assessment Thoracic Assessment: Within Functional Limits Lumbar Assessment Lumbar Assessment: Within Functional Limits Postural Control Postural Control: Within Functional Limits  Balance Balance Balance Assessed: Yes Dynamic Sitting Balance Dynamic Sitting - Balance Support: Feet supported;No upper extremity supported;During functional activity Dynamic Sitting - Level of Assistance: 5: Stand by assistance Dynamic Sitting Balance - Compensations: Pt able to don right prosthesis with no  upper extremeity support and left leg supported. Supervision for safety. Static Standing Balance Static Standing - Balance Support: Bilateral upper extremity supported;During functional activity Static Standing - Level of Assistance: 4: Min assist Static Standing - Comment/# of Minutes: Safety cues for hand placement Dynamic Standing Balance Dynamic Standing - Level of Assistance: 3: Mod assist Dynamic Standing - Balance Activities: Lateral lean/weight shifting;Forward lean/weight shifting;Reaching for objects;Reaching across midline Dynamic Standing - Comments: Requires 1/2 UEs for standing at [facility] x44mn Extremity Assessment  RUE Assessment RUE Assessment: Within Functional Limits LUE Assessment LUE Assessment: Exceptions to [facility] AROM (degrees) Overall AROM Left Upper Extremity: Deficits (pt only able to actively flex shoulder about 90*, more  than ) LUE Strength LUE Overall Strength: Deficits (grossly 3+/5) RLE Assessment RLE Assessment: Exceptions to [facility] RLE AROM (degrees) RLE Overall AROM Comments: Hip ext to neutral RLE Strength RLE Overall Strength Comments: 3+/5 at hip LLE Assessment LLE Assessment: Exceptions to [facility] LLE AROM (degrees) LLE Overall AROM Comments: Knee ext to -5 and DF limited to neutral LLE Strength LLE Overall Strength: Deficits LLE Overall Strength Comments: Grossly 4-/5 throughout   See Function Navigator for Current Functional Status.   Refer to Care Plan for Long Term Goals  Recommendations for other services: None  Discharge Criteria: Patient will be discharged from PT if patient refuses treatment 3 consecutive times without medical reason, if treatment goals not met, if there is a change in medical status, if patient makes no progress towards goals or if patient is discharged from hospital.  The above assessment, treatment plan, treatment alternatives and goals were discussed and mutually agreed upon: by patient  Kennieth Rad, PT,  DPT  09/24/2015, 12:32 PM

## 2015-09-24 NOTE — Progress Notes (Signed)
Physical Therapy Session Note  Patient Details  Name: [name] MRN: [id number] Date of Birth: 1960/11/08  Today's Date: 09/24/2015 PT Individual Time: [id number] PT Individual Time Calculation (min): 30 min   Short Term Goals: Week 1:  PT Short Term Goal 1 (Week 1): STG=LTG due to short LOS  Skilled Therapeutic Interventions/Progress Updates:    Pt received seated EOB with no c/o pain and agreeable to treatment. New w/c retrieved for pt as last w/c had broken per nursing report. Squat pivot to L side with close S. W/c propulsion with BUEs x100' x2 trials. Gait with RW on uneven surface x10' with minA. Pt reports L ankle hurting after gait on uneven surface; assessed ROM, selective tissue tension testing, and palpation. All negative for provocation, non-tender to palpation, and during exam pt reports pain now in L quads. RN alerted to pain and pt request for pain medication. Squat pivot to return to bed with S and remained supine in bed with all needs in reach at completion of session.   Therapy Documentation Precautions:  Precautions Precautions: Fall Precaution Comments: watch HR Required Braces or Orthoses: Other Brace/Splint Other Brace/Splint: R AK prosthetic limb Restrictions Weight Bearing Restrictions: Yes RLE Weight Bearing: Non weight bearing Pain: Pain Assessment Pain Assessment: No/denies pain Pain Score: 0-No pain   See Function Navigator for Current Functional Status.   Therapy/Group: Individual Therapy  Luberta Mutter 09/24/2015, 3:39 PM

## 2015-09-24 NOTE — Progress Notes (Addendum)
[place] PHYSICAL MEDICINE & REHABILITATION     PROGRESS NOTE    Subjective/Complaints: Up in powered w/c. Complains of poor sleep. Prosthesis "doesn't fit".  ROS limited by mood/cognition  Objective: Vital Signs: Blood pressure 108/69, pulse 111, temperature 98.3 F (36.8 C), temperature source Oral, resp. rate 18, height 6' (1.829 m), weight 44.1 kg (97 lb 3.6 oz), SpO2 100 %. No results found.  Recent Labs  09/22/15 0500 09/23/15 0541  WBC 11.4* 13.0*  HGB 9.6* 10.0*  HCT 29.0* 30.0*  PLT 328 355    Recent Labs  09/22/15 0500 09/23/15 0541  NA 137 134*  K 3.8 3.4*  CL 119* 113*  GLUCOSE 107* 69  BUN 7 14  CREATININE 0.96 1.08  CALCIUM 8.2* 8.4*   CBG (last 3)   Recent Labs  09/22/15 0426 09/22/15 0538 09/22/15 1204  GLUCAP 73 143* 155*    Wt Readings from Last 3 Encounters:  09/24/15 44.1 kg (97 lb 3.6 oz)  09/22/15 41.776 kg (92 lb 1.6 oz)  08/06/15 53.615 kg (118 lb 3.2 oz)    Physical Exam:  Constitutional: No distress.  Cachectic   HENT:  Head: Normocephalic and atraumatic.  Eyes: Conjunctivae and EOM are normal.  Neck: Normal range of motion. No thyromegaly present.  Cardiovascular: Normal rate and regular rhythm.  Respiratory: Effort normal and breath sounds normal.  GI: Soft. Bowel sounds are normal. He exhibits no distension.  Musculoskeletal: He exhibits no edema or tenderness.  Right AKA  Neurological: He is alert and oriented to person and place.  Sensation intact to light touch throughout Motor: B/l UE 4/5 proximal to distal LLE: 4+/5 proximal to distal Right hip flexion 4/5.  Skin: Skin is warm and dry.  AKA site is healed. Sacral ulcer with allevyn. Psychiatric: Normal behavior. Cognition and memory are impaired  Assessment/Plan: 1. Weakness/ debility related to multiple medical issues/HIV which require 3+ hours per day of interdisciplinary therapy in a comprehensive inpatient rehab setting. Physiatrist is  providing close team supervision and 24 hour management of active medical problems listed below. Physiatrist and rehab team continue to assess barriers to discharge/monitor patient progress toward functional and medical goals.  Function:  Bathing Bathing position      Bathing parts      Bathing assist        Upper Body Dressing/Undressing Upper body dressing                    Upper body assist        Lower Body Dressing/Undressing Lower body dressing                                  Lower body assist        Toileting Toileting     Toileting steps completed by helper: Adjust clothing prior to toileting, Performs perineal hygiene, Adjust clothing after toileting    Toileting assist Assist level: Touching or steadying assistance (Pt.75%)   Transfers Chair/bed transfer             Locomotion Ambulation           Wheelchair          Cognition Comprehension Comprehension assist level: Understands complex 90% of the time/cues 10% of the time  Expression Expression assist level: Expresses complex 90% of the time/cues < 10% of the time  Social Interaction Social Interaction assist level: Interacts appropriately 75 -  89% of the time - Needs redirection for appropriate language or to initiate interaction.  Problem Solving Problem solving assist level: Solves complex 90% of the time/cues < 10% of the time  Memory Memory assist level: Recognizes or recalls 75 - 89% of the time/requires cueing 10 - 24% of the time   Medical Problem List and Plan: 1. Debilitation/weakness related to Klebsiella pneumonia bacteremia HIV/right AKA/small bowel obstruction/upper GI bleed   -continue CIR therapies 2. DVT Prophylaxis/Anticoagulation/history DVT: will resume warfarin today at low dose 3mg  per IM's rec's. Will ask for pharmacy to help with titration of dosing.  Can continue low dose lovenox until inr therapeutic 3. Pain Management: Oxycodone as needed.  Monitor with increased mobility  4. Acute on chronic anemia. Follow-up CBC  --hgb 10 . Chronic Coumadin remains on hold  5. Neuropsych: This patient is capable of making decisions on his own behalf. 6. Skin/Wound Care/stage II pressure ulcer: Routine skin checks . Foam dressing as recommended by wound care nurse  -improve nutrition  -air mattress overlay for bed 7. Fluids/Electrolytes/Nutrition: encourage po  -add megace for appetite   8. Acute on chronic kidney disease/hyponatremia/hypokalemia. Likely secondary to ATN. Follow-up renal services 9. HIV. CD4 decreased from 700 to 219. Antivirals have been resumed.  -wbc's elevated to 13k    .  LOS (Days) 2 A FACE TO FACE EVALUATION WAS PERFORMED  SWARTZ,ZACHARY T 09/24/2015 8:32 AM

## 2015-09-24 NOTE — Consult Note (Signed)
WOC wound consult note Reason for Consult: sacral ulcer; has been followed by my [place] partner.  Wound type: Unstageable pressure injury Pressure Ulcer POA: Yes Measurement: 5cm x 5cm x 0.2cm  Wound bed:100% yellow, thin slough Drainage (amount, consistency, odor) minimal, serosanguinous  Periwound: intact, very emaciated patient Dressing procedure/placement/frequency: Add enzymatic debridement ointment to clear the wound bed, top with moist gauze.  Change daily.  Has appropriate cushion for his motorized WC.  Air mattress ordered.     [place] team will follow along with you for weekly wound assessments.  Please notify me of any acute changes in the wounds or any new areas of concerns Para March RN,CWOCN [phone]

## 2015-09-24 NOTE — Progress Notes (Signed)
ANTICOAGULATION CONSULT NOTE - Initial Consult  Pharmacy Consult for Coumadin Indication: History of DVT  Allergies  Allergen Reactions  . Dapsone     REACTION: fever  . Sulfamethoxazole-Trimethoprim     REACTION: fever    Patient Measurements: Height: 6' (182.9 cm) Weight: 97 lb 3.6 oz (44.1 kg) IBW/kg (Calculated) : 77.6  Vital Signs: Temp: 98.3 F (36.8 C) (11/25 0552) Temp Source: Oral (11/25 0552) BP: 108/69 mmHg (11/25 0552) Pulse Rate: 111 (11/25 0552)  Labs:  Recent Labs  09/21/15 1852 09/22/15 0500 09/23/15 0541 09/24/15 0508  HGB  --  9.6* 10.0*  --   HCT  --  29.0* 30.0*  --   PLT  --  328 355  --   LABPROT  --  14.9 15.5* 15.2  INR  --  1.15 1.21 1.18  CREATININE 0.95 0.96 1.08  --     Estimated Creatinine Clearance: 48.8 mL/min (by C-G formula based on Cr of 1.08).   Medical History: Past Medical History  Diagnosis Date  . History of chicken pox   . Hyperlipidemia   . History of DVT of lower extremity     right  . Left hip pain 03/09/2013  . Pneumonia   . Chronic kidney disease   . Protein malnutrition ([place]) 08/2015  . HIV (human immunodeficiency virus infection) [facility])     Assessment: 54 y/o M transfered from [facility]. He was on Coumadin pta for unprovoked DVT that led to R AKA. Held coumadin d/t upper GIB, now to restart. He is also on lovenox 40 (weight 44 kg, recent GIB). INR 1.18. hgb 10, plt 355K. He probably require less than 3mg /day, will dose cautiously d/t recent supratherapeutic INR and GIB.  PTA dose: 3mg  daily but INR 10 at [facility]   Goal of Therapy:  INR 2-3 Monitor platelets by anticoagulation protocol: Yes   Plan:  - Coumadin 3mg  po x 1 - Daily PT/INR - Change lovenox to 30 mg sq daily   Maryanna Shape, PharmD, BCPS  Clinical Pharmacist  Pager: [phone]   09/24/2015,1:41 PM

## 2015-09-25 ENCOUNTER — Inpatient Hospital Stay (HOSPITAL_COMMUNITY): Payer: Medicare Other | Admitting: *Deleted

## 2015-09-25 ENCOUNTER — Inpatient Hospital Stay (HOSPITAL_COMMUNITY): Payer: Medicare Other | Admitting: Occupational Therapy

## 2015-09-25 DIAGNOSIS — Z89612 Acquired absence of left leg above knee: Secondary | ICD-10-CM

## 2015-09-25 LAB — PROTIME-INR
INR: 1.11 (ref 0.00–1.49)
Prothrombin Time: 14.5 seconds (ref 11.6–15.2)

## 2015-09-25 MED ORDER — WARFARIN SODIUM 3 MG PO TABS
3.0000 mg | ORAL_TABLET | Freq: Once | ORAL | Status: AC
  Administered 2015-09-25: 3 mg via ORAL
  Filled 2015-09-25: qty 1

## 2015-09-25 MED ORDER — QUETIAPINE FUMARATE 25 MG PO TABS
25.0000 mg | ORAL_TABLET | Freq: Every day | ORAL | Status: DC
  Administered 2015-09-25: 25 mg via ORAL
  Filled 2015-09-25: qty 1

## 2015-09-25 NOTE — Progress Notes (Signed)
Patient slept for 1-2 hours after trazadone given. Seen by RN sitting at the side of the bed and was wanting to use the urinal. Patient was already incontinent of bowel and bladder. He was trying to use the stump shrinker as urinal. Staff came and assisted him to get cleaned but was angry and refusing care including dressing change to his buttock area. Staff was finally able to clean him up. Made comfortable in bed and stayed awake at this time.

## 2015-09-25 NOTE — Progress Notes (Signed)
ANTICOAGULATION CONSULT NOTE - Initial Consult  Pharmacy Consult for Coumadin Indication: History of DVT  Allergies  Allergen Reactions  . Dapsone     REACTION: fever  . Sulfamethoxazole-Trimethoprim     REACTION: fever    Patient Measurements: Height: 6' (182.9 cm) Weight: 96 lb (43.545 kg) IBW/kg (Calculated) : 77.6  Vital Signs: Temp: 98.2 F (36.8 C) (11/26 0501) Temp Source: Oral (11/26 0501) BP: 110/66 mmHg (11/26 0501) Pulse Rate: 97 (11/26 0501)  Labs:  Recent Labs  09/23/15 0541 09/24/15 0508 09/25/15 0455  HGB 10.0*  --   --   HCT 30.0*  --   --   PLT 355  --   --   LABPROT 15.5* 15.2 14.5  INR 1.21 1.18 1.11  CREATININE 1.08  --   --     Estimated Creatinine Clearance: 48.1 mL/min (by C-G formula based on Cr of 1.08).   Medical History: Past Medical History  Diagnosis Date  . History of chicken pox   . Hyperlipidemia   . History of DVT of lower extremity     right  . Left hip pain 03/09/2013  . Pneumonia   . Chronic kidney disease   . Protein malnutrition ([place]) 08/2015  . HIV (human immunodeficiency virus infection) [facility])     Assessment: 54 y/o M transfered from [facility]. He was on Coumadin pta for unprovoked DVT that led to R AKA. Held coumadin d/t upper GIB, restart 11/25. Also on lovenox 30mg  daily (weight 44 kg, recent GIB). INR 1.11 after 3mg  dose  Warfarin. Will continue home regimen for now and see how he responds. Last CBC 11/24 Hgb 10.0, plt 355. No bleeding reported.  PTA dose: 3mg  daily   Goal of Therapy:  INR 2-3 Monitor platelets by anticoagulation protocol: Yes   Plan:  - Coumadin 3mg  po x 1 - Daily PT/INR - Monitor s/sx bleeding   Stephens November, PharmD.  PGY1 Resident  09/25/2015,2:36 PM

## 2015-09-25 NOTE — Progress Notes (Signed)
[place] PHYSICAL MEDICINE & REHABILITATION     PROGRESS NOTE    Subjective/Complaints: Didn't sleep well last night again. Confusion noted as well by RN.  ROS limited by mood/cognition  Objective: Vital Signs: Blood pressure 110/66, pulse 97, temperature 98.2 F (36.8 C), temperature source Oral, resp. rate 18, height 6' (1.829 m), weight 43.545 kg (96 lb), SpO2 100 %. No results found.  Recent Labs  09/23/15 0541  WBC 13.0*  HGB 10.0*  HCT 30.0*  PLT 355    Recent Labs  09/23/15 0541  NA 134*  K 3.4*  CL 113*  GLUCOSE 69  BUN 14  CREATININE 1.08  CALCIUM 8.4*   CBG (last 3)   Recent Labs  09/22/15 1204  GLUCAP 155*    Wt Readings from Last 3 Encounters:  09/25/15 43.545 kg (96 lb)  09/22/15 41.776 kg (92 lb 1.6 oz)  08/06/15 53.615 kg (118 lb 3.2 oz)    Physical Exam:  Constitutional: No distress.  Cachectic   HENT:  Head: Normocephalic and atraumatic.  Eyes: Conjunctivae and EOM are normal.  Neck: Normal range of motion. No thyromegaly present.  Cardiovascular: Normal rate and regular rhythm.  Respiratory: Effort normal and breath sounds normal.  GI: Soft. Bowel sounds are normal. He exhibits no distension.  Musculoskeletal: He exhibits no edema or tenderness.  Right AKA  Neurological: He is alert and oriented to person and place.  Sensation intact to light touch throughout Motor: B/l UE 4/5 proximal to distal LLE: 4+/5 proximal to distal Right hip flexion 4/5.  Skin: Skin is warm and dry.  AKA site is healed. Sacral ulcer with allevyn. Psychiatric: Normal behavior. Cognition and memory are impaired. Slow to engage  Assessment/Plan: 1. Weakness/ debility related to multiple medical issues/HIV which require 3+ hours per day of interdisciplinary therapy in a comprehensive inpatient rehab setting. Physiatrist is providing close team supervision and 24 hour management of active medical problems listed below. Physiatrist and rehab  team continue to assess barriers to discharge/monitor patient progress toward functional and medical goals.  Function:  Bathing Bathing position   Position: Shower (sitting on tub transfer bench)  Bathing parts Body parts bathed by patient: Right arm, Left arm, Chest, Abdomen, Right upper leg, Left upper leg, Left lower leg Body parts bathed by helper: Buttocks, Back  Bathing assist Assist Level: Touching or steadying assistance(Pt > 75%), Set up   Set up : To obtain items  Upper Body Dressing/Undressing Upper body dressing   What is the patient wearing?: Pull over shirt/dress     Pull over shirt/dress - Perfomed by patient: Thread/unthread right sleeve, Thread/unthread left sleeve, Put head through opening Pull over shirt/dress - Perfomed by helper: Pull shirt over trunk        Upper body assist Assist Level: Set up   Set up : To obtain clothing/put away  Lower Body Dressing/Undressing Lower body dressing   What is the patient wearing?: Pants, Shoes, [employer], Underwear Underwear - Performed by patient: Thread/unthread right underwear leg, Thread/unthread left underwear leg Underwear - Performed by helper: Pull underwear up/down Pants- Performed by patient: Thread/unthread right pants leg, Thread/unthread left pants leg, Fasten/unfasten pants Pants- Performed by helper: Pull pants up/down Non-skid slipper socks- Performed by patient: Don/doff left sock   Socks - Performed by patient: Don/doff left sock   Shoes - Performed by patient: Don/doff left shoe Shoes - Performed by helper: Don/doff left shoe, Fasten left       TED Hose -  Performed by helper: Don/doff left TED hose  Lower body assist Assist for lower body dressing:  (Mod assist)      Toileting Toileting     Toileting steps completed by helper: Adjust clothing prior to toileting, Performs perineal hygiene, Adjust clothing after toileting Toileting Assistive Devices: Grab bar or rail (would benefit from using  prosthesis)  Toileting assist Assist level: Touching or steadying assistance (Pt.75%)   Transfers Chair/bed transfer   Chair/bed transfer method: Squat pivot Chair/bed transfer assist level: Moderate assist (Pt 50 - 74%/lift or lower) Chair/bed transfer assistive device: Bedrails, Armrests     Locomotion Ambulation     Max distance: 60 Assist level: Touching or steadying assistance (Pt > 75%)   Wheelchair   Type: Manual (Pt uses motorized primarily at home) Max wheelchair distance: 100 Assist Level: Supervision or verbal cues  Cognition Comprehension Comprehension assist level: Understands complex 90% of the time/cues 10% of the time  Expression Expression assist level: Expresses complex 90% of the time/cues < 10% of the time  Social Interaction Social Interaction assist level: Interacts appropriately 75 - 89% of the time - Needs redirection for appropriate language or to initiate interaction.  Problem Solving Problem solving assist level: Solves complex 90% of the time/cues < 10% of the time  Memory Memory assist level: Recognizes or recalls 75 - 89% of the time/requires cueing 10 - 24% of the time   Medical Problem List and Plan: 1. Debilitation/weakness related to Klebsiella pneumonia bacteremia HIV/right AKA/small bowel obstruction/upper GI bleed   -continue CIR therapies 2. DVT Prophylaxis/Anticoagulation/history DVT: will resume warfarin today at low dose 3mg  per IM's rec's. Will ask for pharmacy to help with titration of dosing.  Can continue low dose lovenox until inr therapeutic 3. Pain Management: Oxycodone as needed. Monitor with increased mobility  4. Acute on chronic anemia. Follow-up CBC  --hgb 10 . Chronic Coumadin remains on hold  5. Neuropsych: This patient is capable of making decisions on his own behalf. 6. Skin/Wound Care/stage II pressure ulcer: Routine skin checks . Foam dressing as recommended by wound care nurse  -improve nutrition  -air mattress  overlay for bed 7. Fluids/Electrolytes/Nutrition: encourage po  -added megace for appetite    -check lytes this week 8. Acute on chronic kidney disease/hyponatremia/hypokalemia. Likely secondary to ATN. Follow-up renal services 9. HIV. CD4 decreased from 700 to 219. Antivirals have been resumed.  -wbc's elevated to 13k   10. Insomnia: dc trazodone, trial of seroquel 25mg  qhs  .  LOS (Days) 3 A FACE TO FACE EVALUATION WAS PERFORMED  Jonell Krontz T 09/25/2015 11:35 AM

## 2015-09-25 NOTE — Progress Notes (Signed)
Occupational Therapy Session Note  Patient Details  Name: [name] MRN: [id number] Date of Birth: 04/22/1961  Today's Date: 09/25/2015 OT Individual Time: [id number] and 1425-1510 OT Individual Time Calculation (min): 55 min and 45 min   Short Term Goals: Week 1:  OT Short Term Goal 1 (Week 1): Short term goals = long term goals  Skilled Therapeutic Interventions/Progress Updates:    1) Treatment session with focus on LB dressing and general strengthening.  Pt requested to change pants this session, requiring close supervision with sit > stand and steady assist when standing and attempting to fasten pants.  Educated on purpose of therapy and increased participation in self-care tasks to increase independence and build up endurance.  Engaged in [place] and trunk control in sitting with use of rebounder in sitting and educated on exercises to complete with level 2 theraband with chest presses, alternating punches, and shoulder presses with pt completing 3 reps of 10 each.  Completed all squat pivot transfers with supervision.  2) Treatment session with focus on dressing and overall strengthening and endurance.  Pt received in bed partially undressed due to having dressing changes from RN.  Pt donned shirt without assist and was able to don and fasten shoe this session and again requiring steady assist when fastening pants in standing.  Engaged in [place] and endurance on UE arm bike for 10 mins on random resistance with resistance changing approx every minute.   Pt required one rest break around 7 mins and then completed 10 mins.  Requested to go outside, transferred to manual w/c and propelled on/off elevators and to main entrance.  Pt decided to not go outside and returned to room with therapist pushing on return trip.  Pt transferred back to bed supervision squat pivot.  Therapy Documentation Precautions:  Precautions Precautions: Fall Precaution Comments: watch  HR Required Braces or Orthoses: Other Brace/Splint Other Brace/Splint: R AK prosthetic limb Restrictions Weight Bearing Restrictions: Yes RLE Weight Bearing: Non weight bearing Pain:  Pt with no c/o pain  See Function Navigator for Current Functional Status.   Therapy/Group: Individual Therapy  Simonne Come 09/25/2015, 11:30 AM

## 2015-09-25 NOTE — Progress Notes (Signed)
Social Work  Social Work Assessment and Plan  Patient Details  Name: [name] MRN: [id number] Date of Birth: 1960/12/28  Today's Date: 09/25/2015  Problem List:  Patient Active Problem List   Diagnosis Date Noted  . Debilitated 09/22/2015  . Renal tubular acidosis   . AIDS ([place])   . AKI (acute kidney injury) ([place])   . Acute blood loss anemia   . History of above knee amputation ([place])   . Hyponatremia   . Hypophosphatemia   . SBO (small bowel obstruction) ([place])   . Hyperosmolality and/or hypernatremia   . Hypomagnesemia   . Pressure ulcer 09/13/2015  . Protein-calorie malnutrition, severe 09/13/2015  . Abdominal pain   . Pneumonia due to Klebsiella pneumoniae ([place])   . Septic shock ([place]) 09/10/2015  . Gram-negative bacteremia ([place]) 09/10/2015  . Polyuria 08/06/2015  . Prediabetes 08/06/2015  . Hematuria 08/06/2015  . UTI (urinary tract infection) 05/14/2015  . Avascular necrosis of femoral head ([place]) 05/14/2015  . Left adrenal mass ([place]) 05/14/2015  . Hypokalemia 10/08/2014  . CKD (chronic kidney disease) 02/10/2014  . Hyperglycemia 12/29/2013  . Hyperbilirubinemia 12/29/2013  . Unintentional weight loss 11/04/2013  . Left hip pain 03/09/2013  . Preventative health care 03/09/2013  . Phantom limb syndrome with pain ([place]) 03/21/2012  . Long term current use of anticoagulant 11/20/2010  . ERECTILE DYSFUNCTION, ORGANIC 04/12/2010  . GERD 08/23/2009  . METHICILLIN RESISTANT STAPHYLOCOCCUS AUREUS INFECTION 08/05/2009  . ENLARGEMENT OF LYMPH NODES 07/20/2009  . Embolism and thrombosis of artery ([place]) 07/19/2009  . HYPOGONADISM 07/15/2009  . DEFICIENCY OF OTHER VITAMINS 07/15/2009  . PULMONARY NODULE 07/15/2009  . Status post above knee amputation ([place]) 07/15/2009  . Human immunodeficiency virus (HIV) disease ([place]) 11/10/2006  . SHINGLES, RECURRENT 11/10/2006  . HEPATITIS B 11/10/2006  . DYSLIPIDEMIA 11/10/2006  . PSYCHOSIS 11/10/2006  . Alcohol abuse 11/10/2006   . CIGARETTE SMOKER 11/10/2006  . PERIPHERAL NEUROPATHY 11/10/2006  . ALLERGIC RHINITIS 11/10/2006  . DENTAL CARIES 11/10/2006   Past Medical History:  Past Medical History  Diagnosis Date  . History of chicken pox   . Hyperlipidemia   . History of DVT of lower extremity     right  . Left hip pain 03/09/2013  . Pneumonia   . Chronic kidney disease   . Protein malnutrition ([place]) 08/2015  . HIV (human immunodeficiency virus infection) ([place])    Past Surgical History:  Past Surgical History  Procedure Laterality Date  . Appendectomy  1962  . Leg amputation above knee  2010    right leg   Social History:  reports that he has been smoking Cigarettes.  He has a 18 pack-year smoking history. He has never used smokeless tobacco. He reports that he does not drink alcohol or use illicit drugs.  Family / Support Systems Marital Status: Widow/Widower How Long?: approx 5 yrs ago Patient Roles: Parent Children: daughter, [name] (lives with pt) @ (C) [phone];  dtr, [name] [facility]) @ (C) [phone] and son, [name], living in [place]. Other Supports: Pt's mother and some extended family living in [place]. Anticipated Caregiver: [name] Ability/Limitations of Caregiver: Dtr at home with patient.  Looking for a job, but unable to find a job yet.  Pt feels that dtr can meet care needs "if she wants to." Caregiver Availability: 24/7 Family Dynamics: Pt offers hint that dtr may be a little resistent to providing care, however, when pressed on this further he reports that he has not concerns about her  assistance at d/c.  Social History Preferred language: English Religion: Non-Denominational Cultural Background: NA Education: HS Read: Yes Write: Yes Employment Status: Disabled Date Retired/Disabled/Unemployed: "over 20 years ago...they gave it to me when I finally snapped" Legal Hisotry/Current Legal Issues: No current legal issues. Guardian/Conservator: None - per MD, pt  capable of making decisions on his own behalf.   Abuse/Neglect Physical Abuse: Denies Verbal Abuse: Denies Sexual Abuse: Denies Exploitation of patient/patient's resources: Denies Self-Neglect: Denies  Emotional Status Pt's affect, behavior adn adjustment status: Pt able to complete assessment interview without difficulty.  Flat affect throughout but able to elicit a slight laugh a couple of times.  He does report feeling "down", however, would not elaborate.  He is receptive to a referral for neuropsychology and formal depression screening - have referred. Recent Psychosocial Issues: declining mobility but was using walker and w/c in the home. Pyschiatric History: As noted, pt reports that his health reason for disability was "when I snapped" and reports he was hospitalized, however, could not state what his diagnosis was exactly.  Review of medical record does not indicate anything either.   Substance Abuse History: Pt admits SA hx but nothing currently.  Patient / Family Perceptions, Expectations & Goals Pt/Family understanding of illness & functional limitations: Pt with very basic understanding that he was suffering with pneumonia and little recall of his admission.  Aware of his current functional deficits/ need for CIR.   Premorbid pt/family roles/activities: Pt was using his prosthesis and walker in the home.  Little use of his power w/c. Anticipated changes in roles/activities/participation: little change anticipated if able to reach mod i/ supervision goals Pt/family expectations/goals: "I just hope I can go home soon."  [employer]: None Premorbid Home Care/DME Agencies: None Transportation available at discharge: yes - daughter Resource referrals recommended: Neuropsychology  Discharge Planning Living Arrangements: Children Support Systems: Children, Other relatives, Parent Type of Residence: Private residence Insurance Resources: Medicare,  [place]spec[place]) Pensions consultant: SSD, SSI Financial Screen Referred: No Living Expenses: Education officer, community Management: Family Does the patient have any problems obtaining your medications?: No Home Management: shared with pt and daughter - pt notes that daughter handles meals and housekeeping. Patient/Family Preliminary Plans: Pt plans to return home with his daughter as primary caregiver. Social Work Anticipated Follow Up Needs: HH/OP Expected length of stay: 7-10 days  Clinical Impression Very frail appearing gentleman who looks much older than true age lying in bed and able to complete interview assessment without difficulty.  Has general understanding of multiple medical issues which have led to his current state of debility/ need for CIR.  Does admit to feeling "down" about his situation - have referred to neuropsychology for formal depression screen.  Daughter is primary care and able to provide 24/7 assistance unless she gets a job.  Hope to reach mod i / supervision LOF.  Will follos for support and d/c planning needs.  [name] 09/25/2015, 1:47 PM

## 2015-09-25 NOTE — Progress Notes (Signed)
Physical Therapy Session Note  Patient Details  Name: [name] MRN: [id number] Date of Birth: 04-04-1961  Today's Date: 09/25/2015     Skilled Therapeutic Interventions/Progress Updates:  Session I [phone] (60 min )  Patient ready for therapy session, came to the gym on his own in his electric w/c,initially no complains of pain ,agrees to return to room to switch to manual w/c,transfer with RW with min A ,very impulsive.  NuStep 1 x 10 min with multiple rest breaks due to fatigue and complains of phantom pain, nursing notified ,meds administered.  Training in short distance ambulation 2 x 50 feet with min A and cues for posture and to decrease velocity. Therapeutic exercises in sitting EOM with resistance tape for UE and L LE.  Patient needs increased redirections to stay on task and maintain focus. At the end of session returned to room ,switched to motorized w/c ,QR belt on and all needs within reach.  Session II 1255-1325 (41min)   Transfer to manual w/c to train in w/c propulsion to the gym.  Training stair training with maneuvering one step up forward and descending backward with max A ,due to impulsive behaviors and strong forward lean. Gait Training with RW 2 x 100 feet with min A and cues for decreased speed. Sitting EOM and bouncing a basketball x 3 min with cues to continue. Patient propelled w/c back to room and transferred with min A to bed, alarm on, all needs within reach.  Therapy Documentation Precautions:  Precautions Precautions: Fall Precaution Comments: watch HR Required Braces or Orthoses: Other Brace/Splint Other Brace/Splint: R AK prosthetic limb Restrictions Weight Bearing Restrictions: Yes RLE Weight Bearing: Non weight bearing Pain: Pain Assessment Pain Assessment: 0-10 Pain Score: 2  Pain Type: Acute pain Pain Location: Back Pain Descriptors / Indicators: Aching Pain Frequency: Intermittent Pain Intervention(s): Medication (See eMAR)   See  Function Navigator for Current Functional Status.   Therapy/Group: Individual Therapy  [name] 09/25/2015, 12:26 PM

## 2015-09-26 ENCOUNTER — Inpatient Hospital Stay (HOSPITAL_COMMUNITY): Payer: Medicare Other

## 2015-09-26 ENCOUNTER — Inpatient Hospital Stay (HOSPITAL_COMMUNITY): Payer: Medicare Other | Admitting: Occupational Therapy

## 2015-09-26 ENCOUNTER — Inpatient Hospital Stay (HOSPITAL_COMMUNITY): Payer: Medicare Other | Admitting: Physical Therapy

## 2015-09-26 LAB — CBC
HCT: 29.5 % — ABNORMAL LOW (ref 39.0–52.0)
HEMOGLOBIN: 9.9 g/dL — AB (ref 13.0–17.0)
MCH: 30 pg (ref 26.0–34.0)
MCHC: 33.6 g/dL (ref 30.0–36.0)
MCV: 89.4 fL (ref 78.0–100.0)
PLATELETS: 403 10*3/uL — AB (ref 150–400)
RBC: 3.3 MIL/uL — AB (ref 4.22–5.81)
RDW: 20.1 % — ABNORMAL HIGH (ref 11.5–15.5)
WBC: 10.5 10*3/uL (ref 4.0–10.5)

## 2015-09-26 LAB — PROTIME-INR
INR: 1.2 (ref 0.00–1.49)
PROTHROMBIN TIME: 15.4 s — AB (ref 11.6–15.2)

## 2015-09-26 MED ORDER — MORPHINE SULFATE ER 15 MG PO TBCR
60.0000 mg | EXTENDED_RELEASE_TABLET | Freq: Two times a day (BID) | ORAL | Status: DC
  Administered 2015-09-26 – 2015-09-27 (×4): 60 mg via ORAL
  Filled 2015-09-26 (×5): qty 4

## 2015-09-26 MED ORDER — QUETIAPINE FUMARATE 50 MG PO TABS
50.0000 mg | ORAL_TABLET | Freq: Every day | ORAL | Status: DC
  Administered 2015-09-26 – 2015-09-27 (×2): 50 mg via ORAL
  Filled 2015-09-26 (×2): qty 1

## 2015-09-26 MED ORDER — WARFARIN SODIUM 5 MG PO TABS
5.0000 mg | ORAL_TABLET | Freq: Once | ORAL | Status: AC
  Administered 2015-09-26: 5 mg via ORAL
  Filled 2015-09-26: qty 1

## 2015-09-26 NOTE — Progress Notes (Signed)
Occupational Therapy Session Note  Patient Details  Name: [name] MRN: [id number] Date of Birth: Apr 21, 1961  Today's Date: 09/26/2015 OT Individual Time: [id number] OT Individual Time Calculation (min): 59 min    Short Term Goals: Week 1:  OT Short Term Goal 1 (Week 1): Short term goals = long term goals  Skilled Therapeutic Interventions/Progress Updates:  Upon entering the room, pt seated in power chair with no c/o pain this session. Pt agreeable to OT intervention. Pt utilized power chair to tub/shower combo to demonstrate shower transfer. Pt performed set up of power chair independently and transferred onto TTB with close supervision and min verbal cues for safety. Pt then ambulating in hallway 50' with use of RW and steady assist. Pt seated in power chair and engaged in shoulder flexion exercises with 10 lbs resistance 2 sets of 10 reps with rest break needed secondary to fatigue. Pt returned to room at end of session. Call bell and all needed items within reach upon exiting the room.   Therapy Documentation Precautions:  Precautions Precautions: Fall Precaution Comments: watch HR Required Braces or Orthoses: Other Brace/Splint Other Brace/Splint: R AK prosthetic limb Restrictions Weight Bearing Restrictions: Yes RLE Weight Bearing: Non weight bearing  See Function Navigator for Current Functional Status.   Therapy/Group: Individual Therapy  Drayce, Hollings 09/26/2015, 1:58 PM

## 2015-09-26 NOTE — Progress Notes (Signed)
[place] PHYSICAL MEDICINE & REHABILITATION     PROGRESS NOTE    Subjective/Complaints: Still with limited sleep. "where are my #5's and and 120's i take for pain?" ROS limited by mood/cognition  Objective: Vital Signs: Blood pressure 119/66, pulse 98, temperature 97.6 F (36.4 C), temperature source Oral, resp. rate 18, height 6' (1.829 m), weight 42.638 kg (94 lb), SpO2 100 %. No results found. No results for input(s): WBC, HGB, HCT, PLT in the last 72 hours. No results for input(s): NA, K, CL, GLUCOSE, BUN, CREATININE, CALCIUM in the last 72 hours.  Invalid input(s): CO CBG (last 3)  No results for input(s): GLUCAP in the last 72 hours.  Wt Readings from Last 3 Encounters:  09/26/15 42.638 kg (94 lb)  09/22/15 41.776 kg (92 lb 1.6 oz)  08/06/15 53.615 kg (118 lb 3.2 oz)    Physical Exam:  Constitutional: No distress.  Cachectic   HENT:  Head: Normocephalic and atraumatic.  Eyes: Conjunctivae and EOM are normal.  Neck: Normal range of motion. No thyromegaly present.  Cardiovascular: Normal rate and regular rhythm.  Respiratory: Effort normal and breath sounds normal.  GI: Soft. Bowel sounds are normal. He exhibits no distension.  Musculoskeletal: He exhibits no edema or tenderness.  Right AKA  Neurological: He is alert and oriented to person and place.  Sensation intact to light touch throughout Motor: B/l UE 4/5 proximal to distal LLE: 4+/5 proximal to distal Right hip flexion 4/5.  Skin: Skin is warm and dry.  AKA site is healed. Sacral ulcer with allevyn. Psychiatric: Normal behavior. Cognition and memory are impaired. Slow to engage  Assessment/Plan: 1. Weakness/ debility related to multiple medical issues/HIV which require 3+ hours per day of interdisciplinary therapy in a comprehensive inpatient rehab setting. Physiatrist is providing close team supervision and 24 hour management of active medical problems listed below. Physiatrist and rehab  team continue to assess barriers to discharge/monitor patient progress toward functional and medical goals.  Function:  Bathing Bathing position   Position: Shower (sitting on tub transfer bench)  Bathing parts Body parts bathed by patient: Right arm, Left arm, Chest, Abdomen, Right upper leg, Left upper leg, Left lower leg Body parts bathed by helper: Buttocks, Back  Bathing assist Assist Level: Touching or steadying assistance(Pt > 75%), Set up   Set up : To obtain items  Upper Body Dressing/Undressing Upper body dressing   What is the patient wearing?: Pull over shirt/dress     Pull over shirt/dress - Perfomed by patient: Thread/unthread right sleeve, Thread/unthread left sleeve, Put head through opening Pull over shirt/dress - Perfomed by helper: Pull shirt over trunk        Upper body assist Assist Level: Set up   Set up : To obtain clothing/put away  Lower Body Dressing/Undressing Lower body dressing   What is the patient wearing?: Pants, Shoes, [employer], Underwear Underwear - Performed by patient: Thread/unthread right underwear leg, Thread/unthread left underwear leg Underwear - Performed by helper: Pull underwear up/down Pants- Performed by patient: Thread/unthread right pants leg, Thread/unthread left pants leg, Fasten/unfasten pants Pants- Performed by helper: Pull pants up/down Non-skid slipper socks- Performed by patient: Don/doff left sock   Socks - Performed by patient: Don/doff left sock   Shoes - Performed by patient: Don/doff left shoe Shoes - Performed by helper: Don/doff left shoe, Fasten left       TED Hose - Performed by helper: Don/doff left TED hose  Lower body assist Assist for lower body  dressing:  (Mod assist)      Toileting Toileting     Toileting steps completed by helper: Adjust clothing prior to toileting, Performs perineal hygiene, Adjust clothing after toileting Toileting Assistive Devices: Grab bar or rail  Toileting assist Assist  level: Touching or steadying assistance (Pt.75%)   Transfers Chair/bed transfer   Chair/bed transfer method: Squat pivot Chair/bed transfer assist level: Moderate assist (Pt 50 - 74%/lift or lower) Chair/bed transfer assistive device: Bedrails, Armrests     Locomotion Ambulation     Max distance: 60 Assist level: Touching or steadying assistance (Pt > 75%)   Wheelchair   Type: Manual (Pt uses motorized primarily at home) Max wheelchair distance: 100 Assist Level: Supervision or verbal cues  Cognition Comprehension Comprehension assist level: Understands basic 75 - 89% of the time/ requires cueing 10 - 24% of the time  Expression Expression assist level: Expresses basic needs/ideas: With extra time/assistive device  Social Interaction Social Interaction assist level: Interacts appropriately 75 - 89% of the time - Needs redirection for appropriate language or to initiate interaction.  Problem Solving Problem solving assist level: Solves basic 50 - 74% of the time/requires cueing 25 - 49% of the time  Memory Memory assist level: Recognizes or recalls 50 - 74% of the time/requires cueing 25 - 49% of the time   Medical Problem List and Plan: 1. Debilitation/weakness related to Klebsiella pneumonia bacteremia HIV/right AKA/small bowel obstruction/upper GI bleed   -continue CIR therapies 2. DVT Prophylaxis/Anticoagulation/history DVT: resumed warfarin. Appreciate pharmacy's assistance in further titration.   Can continue low dose lovenox until inr therapeutic 3. Pain Management: Oxycodone as needed.   -will resume ms contin 60mg  q12. (was taking 120mg  at home per Dr. Johnnye Sima)  4. Acute on chronic anemia. Follow-up CBC  --hgb 10 . Chronic Coumadin resumed.  5. Neuropsych: This patient is capable of making decisions on his own behalf. 6. Skin/Wound Care/stage II pressure ulcer: Routine skin checks . Foam dressing as recommended by wound care nurse  -improve nutrition  -air mattress  overlay for bed 7. Fluids/Electrolytes/Nutrition: encourage po  -added megace for appetite    -check lytes this week  -can dc central line 8. Acute on chronic kidney disease/hyponatremia/hypokalemia. Likely secondary to ATN. Follow-up renal services 9. HIV. CD4 decreased from 700 to 219. Antivirals have been resumed.  -wbc's elevated to 13k   10. Insomnia: increase seroquel to 50mg  qhs  .  LOS (Days) 4 A FACE TO FACE EVALUATION WAS PERFORMED  SWARTZ,ZACHARY T 09/26/2015 7:35 AM

## 2015-09-26 NOTE — Progress Notes (Signed)
Physical Therapy Note  Patient Details  Name: [name] MRN: [id number] Date of Birth: 1961-03-05 Today's Date: 09/26/2015    Time: 800-900 60 minutes  1:1 No c/o pain at rest. Pt c/o pain in back when lying down, reports he rec'd pain meds prior to session, repositioned for comfort.  Transfers with cues for safety with set up with supervision squat pivot.  Supine and sidelying therex for bilat LE strengthening and stretching. Attempted to stretch pt's hips into ext, unable to tolerate past neutral bilat.  Sit to stands multiple reps with RW for strengthening with supervision. Gt with RW 2 x 50' with supervision. UBE for strength and endurance training x 6 mins (70fwd/3bkwd). Pt requires multiple rests due to back pain, feels more comfortable in sidelying position.  Multiple squat pivot transfers with pt continuing to require cues for safety with set up.   Franchot Pollitt 09/26/2015, 8:56 AM

## 2015-09-26 NOTE — Progress Notes (Signed)
ANTICOAGULATION CONSULT NOTE - Initial Consult  Pharmacy Consult for Coumadin Indication: History of DVT  Allergies  Allergen Reactions  . Dapsone     REACTION: fever  . Sulfamethoxazole-Trimethoprim     REACTION: fever    Patient Measurements: Height: 6' (182.9 cm) Weight: 94 lb (42.638 kg) IBW/kg (Calculated) : 77.6  Vital Signs: Temp: 97.6 F (36.4 C) (11/27 0547) Temp Source: Oral (11/27 0547) BP: 119/66 mmHg (11/27 0547) Pulse Rate: 98 (11/27 0547)  Labs:  Recent Labs  09/24/15 0508 09/25/15 0455 09/26/15 0510  LABPROT 15.2 14.5 15.4*  INR 1.18 1.11 1.20    Estimated Creatinine Clearance: 47.1 mL/min (by C-G formula based on Cr of 1.08).   Medical History: Past Medical History  Diagnosis Date  . History of chicken pox   . Hyperlipidemia   . History of DVT of lower extremity     right  . Left hip pain 03/09/2013  . Pneumonia   . Chronic kidney disease   . Protein malnutrition ([place]) 08/2015  . HIV (human immunodeficiency virus infection) [facility])     Assessment: 54 y/o M transfered from [facility]. He was on Coumadin pta for unprovoked DVT that led to R AKA.  INR 1.11>1.20 after 3mg  dose last night. Continues on lovenox 30mg  SQ daily. No CBC since 11/24. Will order today especially with recent Hx of GI bleed (11/11). No bleeding reported currently.  PTA dose: 3mg  daily   Goal of Therapy:  INR 2-3 Monitor platelets by anticoagulation protocol: Yes   Plan:  Coumadin 5mg  po x 1 Daily PT/INR CBC ordered for today Monitor CBC, s/sx bleeding   Stephens November, PharmD.  PGY1 Resident  09/26/2015,9:18 AM

## 2015-09-26 NOTE — Progress Notes (Addendum)
Physical Therapy Session Note  Patient Details  Name: [name] MRN: [id number] Date of Birth: [birth year]  Today's Date: 09/26/2015 PT Individual Time: 1500-1610 PT Individual Time Calculation (min): 70 min   Short Term Goals: Week 1:  PT Short Term Goal 1 (Week 1): STG=LTG due to short LOS  Skilled Therapeutic Interventions/Progress Updates: pt sitting in motorized chair with pants unzipped, falling off of him due to being too big.  Pt initially refused tx, but eventually asked for help doffing pants and donning shorts.  Sit><stand with supervision, cues for safe hand placement on RW  Pt drove motorized chair to/from gym.  Gait on carpet x 20' with min guard assist, no problem clearing L foot, RW.  Not wearing R prosthesis due to poor fit of socket.  Chair><mat squat pivot transfers with supervision. Bed mobility very slow with poor eccentric control sitting edge of mat>R sidelying and L sidelying> sitting.  Blocked practice with mod cues for technique.  Pt slowly able to get into prone with bil arms down by sides, L foot hanging off of mat, with 1 pillow under pelvis, one under head with head turned, for prolonged bil hip flexor stretch to facilitate upright posture and balance strategies.  Pt fell asleep in prone; tolerated prone positioning x 20 min for long term hip flexor stretch.  Pt reported he felt much better and "looser" after prone lying.  Pt tends to lean forward in chair to avoid pressure on sacrum/wound, compounding problem of tight hip flexors.  PT donned quick release belt on pt in motorized chair in room at end of session; provided a drink and left all needs within reach.  Pt initially stated he needed to get on BSC, then 1 minute later had no memory of this, and said he did not need to, then changed his mind again.  PT referred pt to NT for possible need to use toilet.  NT stated pt had been confused today.    Therapy Documentation Precautions:  Precautions Precautions:  Fall Precaution Comments: watch HR Required Braces or Orthoses: Other Brace/Splint Other Brace/Splint: R AK prosthetic limb Restrictions Weight Bearing Restrictions: Yes RLE Weight Bearing: Non weight bearing     Pain: Pain Assessment Pain Assessment: No/denies pain, but movement appears painful.  Unable to rate.       See Function Navigator for Current Functional Status.   Therapy/Group: Individual Therapy  Ondria Oswald 09/26/2015, 4:34 PM

## 2015-09-26 NOTE — Progress Notes (Signed)
Dr Naaman Plummer,  Pt has TL  Subclavian central line but is no longer getting IV meds.  May we have an order to remove the central line?   Thank you,  Brandon Melnick, RN IV Team

## 2015-09-27 ENCOUNTER — Inpatient Hospital Stay (HOSPITAL_COMMUNITY): Payer: Medicare Other | Admitting: Occupational Therapy

## 2015-09-27 ENCOUNTER — Encounter (HOSPITAL_COMMUNITY): Payer: Medicare Other

## 2015-09-27 ENCOUNTER — Inpatient Hospital Stay (HOSPITAL_COMMUNITY): Payer: Medicare Other

## 2015-09-27 LAB — PROTIME-INR
INR: 1.31 (ref 0.00–1.49)
Prothrombin Time: 16.4 seconds — ABNORMAL HIGH (ref 11.6–15.2)

## 2015-09-27 MED ORDER — WARFARIN SODIUM 5 MG PO TABS
5.0000 mg | ORAL_TABLET | Freq: Once | ORAL | Status: AC
  Administered 2015-09-27: 5 mg via ORAL
  Filled 2015-09-27: qty 1

## 2015-09-27 NOTE — Plan of Care (Signed)
Problem: RH SKIN INTEGRITY Goal: RH STG MAINTAIN SKIN INTEGRITY WITH ASSISTANCE STG Maintain Skin Integrity With Assistance. Mod A  Outcome: Progressing Daily dressing change to sacral ulcer  Problem: RH PAIN MANAGEMENT Goal: RH STG PAIN MANAGED AT OR BELOW PT'S PAIN GOAL <5  Outcome: Progressing Scheduled pain med

## 2015-09-27 NOTE — Plan of Care (Signed)
Problem: RH Balance Goal: LTG Patient will maintain dynamic sitting balance (PT) LTG: Patient will maintain dynamic sitting balance with assistance during mobility activities (PT)  Downgraded for safety. ABG  Problem: RH Bed Mobility Goal: LTG Patient will perform bed mobility with assist (PT) LTG: Patient will perform bed mobility with assistance, with/without cues (PT).  downgraded for safety ABG.  Problem: RH Bed to Chair Transfers Goal: LTG Patient will perform bed/chair transfers w/assist (PT) LTG: Patient will perform bed/chair transfers with assistance, with/without cues (PT).  downgraded for safety ABG.

## 2015-09-27 NOTE — Progress Notes (Signed)
Patient refused to get in bed (air overlay); both RN and NT attempted many times.  Patient remaining in recliner or motorized wheelchair with safety belt on. 0205 RN attempted to toilet patient, patient refused to transfer to toilet with RN in bathroom stating "I'm finished then, I'm done using the bathroom."  RN attempted reeducation of safety precautions, offered urinal instead-patient refused.  Will continue to monitor.

## 2015-09-27 NOTE — Progress Notes (Signed)
Occupational Therapy Session Note  Patient Details  Name: [name] MRN: [id number] Date of Birth: 20-Jun-1961  Today's Date: 09/27/2015 OT Individual Time: [id number] and 1300-1400 OT Individual Time Calculation (min): 75 min and 60 min   Short Term Goals: Week 1:  OT Short Term Goal 1 (Week 1): Short term goals = long term goals  Skilled Therapeutic Interventions/Progress Updates:    Session One: Pt seen for OT ADL bathing and dressing session. Pt in w/[name] upon arrival with RN present administering morning meds. Pt agreeable to bathing at shower level, however, once set-up to complete shower transfer, pt declined, stating he was cold, and didn't want to get wet. He completed grooming/ modified bathing seated in power w/[name] at sink. He completed standing toileting task with steadying assist, tolerating ~1 minute of static standing to complete task.  In ADL apartment, pt ambulated throughout room with RW and close supervision- steadying assist. He completed sit <> stand from low surface couch, requiring min A and VCs for technique. Bed mobility completed on standard bed at supervision- mod I level. Pt required questioning cues to locate kitchen, as he initially walked into bathroom when instructed to go to kitchen. In kitchen, pt tolerated ~5 minutes of static standing to remove/replace items in overhead cabinet, demonstrating functional static standing balance and endurance.  He returned to room in power w/[name] at end of session, left with quick release belt donned and all needs in reach.  Throughout session, pt required VCs for sequencing of basic self care tasks.  Discussed abilities PTA, need for assist, daily routines, and d/[name] planning throughout session.   Session Two: Pt seen for OT therapy session focusing on cognitive assessment, and functional mobility. Pt in w/[name] upon arrival with RN present, requesting assist for pt to stand in order for sacral dressing to be changed. Steadying assist  required to stand at [facility] for care to be performed, tolerating ~3 minutes of static standing. [place] Cognitive Assessment [facility]) administered. Pt scored 9/30 on MOCA, a score of 26/30 or higher is considered normal cognition. Pt displayed difficulty maintaining attention to task and decreased ability to follow multi-step commands, requiring VCs for redirection to task.  Pt then ambulated throughout unit with steadying assist, ambulating ~45 ft. In electric w/[name], pt went to gym. He stood at [facility] to complete horseshoe tossing task with steadying assist working on functional standing balance and endurance.  Pt left in gym at end of session with hand off to PT.   Therapy Documentation Precautions:  Precautions Precautions: Fall Precaution Comments: watch HR Required Braces or Orthoses: Other Brace/Splint Other Brace/Splint: R AK prosthetic limb Restrictions Weight Bearing Restrictions: Yes RLE Weight Bearing: Non weight bearing Pain: Pain Assessment Pain Assessment: 0-10 Pain Score: 4  Pain Type: Acute pain Pain Location: Buttocks Pain Orientation: Right Pain Descriptors / Indicators: Aching Pain Frequency: Intermittent Pain Onset: Gradual Patients Stated Pain Goal: 0 Pain Intervention(s): Medication (See eMAR) (scheduled pain med), RN aware, repositioned, ambulation/ increased activity  See Function Navigator for Current Functional Status.   Therapy/Group: Individual Therapy  [name] 09/27/2015, 7:15 AM

## 2015-09-27 NOTE — Progress Notes (Signed)
Orth[facility] Tech Progress Note Patient Details:  [name] November 06, 1960 [id number]  Patient ID: [name], male   DOB: 1961-02-11, 54 y.o.   MRN: [id number]   [name] 09/27/2015, 9:49 AMCalled [employer] for Ak assessment.

## 2015-09-27 NOTE — Progress Notes (Signed)
[place] PHYSICAL MEDICINE & REHABILITATION     PROGRESS NOTE    Subjective/Complaints: Still with limited sleep. "where are my #5's and and 120's i take for pain?" ROS limited by mood/cognition  Objective: Vital Signs: Blood pressure 106/66, pulse 95, temperature 97.8 F (36.6 C), temperature source Oral, resp. rate 18, height 6' (1.829 m), weight 42.638 kg (94 lb), SpO2 100 %. No results found.  Recent Labs  09/26/15 0950  WBC 10.5  HGB 9.9*  HCT 29.5*  PLT 403*   No results for input(s): NA, K, CL, GLUCOSE, BUN, CREATININE, CALCIUM in the last 72 hours.  Invalid input(s): CO CBG (last 3)  No results for input(s): GLUCAP in the last 72 hours.  Wt Readings from Last 3 Encounters:  09/26/15 42.638 kg (94 lb)  09/22/15 41.776 kg (92 lb 1.6 oz)  08/06/15 53.615 kg (118 lb 3.2 oz)    Physical Exam:  Constitutional: No distress.  Cachectic   HENT:  Head: Normocephalic and atraumatic.  Eyes: Conjunctivae and EOM are normal.  Neck: Normal range of motion. No thyromegaly present.  Cardiovascular: Normal rate and regular rhythm.  Respiratory: Effort normal and breath sounds normal.  GI: Soft. Bowel sounds are normal. He exhibits no distension.  Musculoskeletal: He exhibits no edema or tenderness.  Right AKA  Neurological: He is alert and oriented to person and place.  Sensation intact to light touch throughout Motor: B/l UE 4/5 proximal to distal LLE: 4+/5 proximal to distal Right hip flexion 4/5.  Skin: Skin is warm and dry.  AKA site is healed. Sacral ulcer dressed Psychiatric: Normal behavior. Cognition and memory are impaired. Slow to engage  Assessment/Plan: 1. Weakness/ debility related to multiple medical issues/HIV which require 3+ hours per day of interdisciplinary therapy in a comprehensive inpatient rehab setting. Physiatrist is providing close team supervision and 24 hour management of active medical problems listed below. Physiatrist and  rehab team continue to assess barriers to discharge/monitor patient progress toward functional and medical goals.  Function:  Bathing Bathing position   Position: Shower (sitting on tub transfer bench)  Bathing parts Body parts bathed by patient: Right arm, Left arm, Chest, Abdomen, Right upper leg, Left upper leg, Left lower leg Body parts bathed by helper: Buttocks, Back  Bathing assist Assist Level: Touching or steadying assistance(Pt > 75%), Set up   Set up : To obtain items  Upper Body Dressing/Undressing Upper body dressing   What is the patient wearing?: Pull over shirt/dress     Pull over shirt/dress - Perfomed by patient: Thread/unthread right sleeve, Thread/unthread left sleeve, Put head through opening Pull over shirt/dress - Perfomed by helper: Pull shirt over trunk        Upper body assist Assist Level: Set up   Set up : To obtain clothing/put away  Lower Body Dressing/Undressing Lower body dressing   What is the patient wearing?: Pants, Shoes, [employer], Underwear Underwear - Performed by patient: Thread/unthread right underwear leg, Thread/unthread left underwear leg Underwear - Performed by helper: Pull underwear up/down Pants- Performed by patient: Thread/unthread right pants leg, Thread/unthread left pants leg, Fasten/unfasten pants Pants- Performed by helper: Pull pants up/down Non-skid slipper socks- Performed by patient: Don/doff left sock   Socks - Performed by patient: Don/doff left sock   Shoes - Performed by patient: Don/doff left shoe Shoes - Performed by helper: Don/doff left shoe, Fasten left       TED Hose - Performed by helper: Don/doff left TED hose  Lower  body assist Assist for lower body dressing:  (Mod assist)      Toileting Toileting     Toileting steps completed by helper: Adjust clothing prior to toileting, Performs perineal hygiene, Adjust clothing after toileting Toileting Assistive Devices: Grab bar or rail  Toileting assist  Assist level: Touching or steadying assistance (Pt.75%)   Transfers Chair/bed transfer   Chair/bed transfer method: Squat pivot Chair/bed transfer assist level: Touching or steadying assistance (Pt > 75%) Chair/bed transfer assistive device: Bedrails, Armrests     Locomotion Ambulation     Max distance: 20 Assist level: Touching or steadying assistance (Pt > 75%)   Wheelchair   Type: Motorized Max wheelchair distance: 180 Assist Level: Supervision or verbal cues  Cognition Comprehension Comprehension assist level: Understands basic 75 - 89% of the time/ requires cueing 10 - 24% of the time  Expression Expression assist level: Expresses basic needs/ideas: With extra time/assistive device  Social Interaction Social Interaction assist level: Interacts appropriately 75 - 89% of the time - Needs redirection for appropriate language or to initiate interaction.  Problem Solving Problem solving assist level: Solves basic 50 - 74% of the time/requires cueing 25 - 49% of the time  Memory Memory assist level: Recognizes or recalls 50 - 74% of the time/requires cueing 25 - 49% of the time   Medical Problem List and Plan: 1. Debilitation/weakness related to Klebsiella pneumonia bacteremia HIV/right AKA/small bowel obstruction/upper GI bleed   -continue CIR therapies  -will ask [employer] to assess right AK prosthesis for fit 2. DVT Prophylaxis/Anticoagulation/history DVT: resumed warfarin. Appreciate pharmacy's assistance in further titration.   Can continue low dose lovenox until inr therapeutic 3. Pain Management: Oxycodone as needed.   -resumed ms contin 60mg  q12. (was taking 120mg  at home per Dr. Johnnye Sima)  4. Acute on chronic anemia. Follow-up CBC  --hgb 10 . Chronic Coumadin resumed.  5. Neuropsych: This patient is capable of making decisions on his own behalf. 6. Skin/Wound Care/stage II pressure ulcer: Routine skin checks . Foam dressing as recommended by wound care nurse  -improve  nutrition  -air mattress overlay for bed  -continued patient education 7. Fluids/Electrolytes/Nutrition: encourage po  -added megace for appetite    -check lytes Tuesday  -removed central line 8. Acute on chronic kidney disease/hyponatremia/hypokalemia. Likely secondary to ATN. Follow-up renal services 9. HIV. CD4 decreased from 700 to 219. Antivirals have been resumed.  -wbc's  10.5k   10. Insomnia: increased seroquel to 50mg  qhs with some benefit  .  LOS (Days) 5 A FACE TO FACE EVALUATION WAS PERFORMED  SWARTZ,ZACHARY T 09/27/2015 8:05 AM

## 2015-09-27 NOTE — Progress Notes (Signed)
ANTICOAGULATION CONSULT NOTE - Initial Consult  Pharmacy Consult for Coumadin Indication: History of DVT  Allergies  Allergen Reactions  . Dapsone     REACTION: fever  . Sulfamethoxazole-Trimethoprim     REACTION: fever    Patient Measurements: Height: 6' (182.9 cm) Weight:  (unable to get wt; pt up in chair. ) IBW/kg (Calculated) : 77.6  Vital Signs: Temp: 97.8 F (36.6 C) (11/28 0717) Temp Source: Oral (11/28 0717) BP: 106/66 mmHg (11/28 0717) Pulse Rate: 95 (11/28 0717)  Labs:  Recent Labs  09/25/15 0455 09/26/15 0510 09/26/15 0950 09/27/15 0713  HGB  --   --  9.9*  --   HCT  --   --  29.5*  --   PLT  --   --  403*  --   LABPROT 14.5 15.4*  --  16.4*  INR 1.11 1.20  --  1.31    Estimated Creatinine Clearance: 47.1 mL/min (by C-G formula based on Cr of 1.08).   Medical History: Past Medical History  Diagnosis Date  . History of chicken pox   . Hyperlipidemia   . History of DVT of lower extremity     right  . Left hip pain 03/09/2013  . Pneumonia   . Chronic kidney disease   . Protein malnutrition ([place]) 08/2015  . HIV (human immunodeficiency virus infection) [facility])     Assessment: 54 y/o M transfered from [facility]. Continue on coumadin for history of unprovoked DVT that led to R AKA. INR = 1.31 today, remains subtherapeutic, not increasing much.  PTA dose: 3mg  daily but INR 10 at [facility], recent GIB  Goal of Therapy:  INR 2-3 Monitor platelets by anticoagulation protocol: Yes   Plan:  - Coumadin 5mg  po x 1 - Daily PT/INR - Lovenox to 30 mg sq daily   Maryanna Shape, PharmD, BCPS  Clinical Pharmacist  Pager: [phone]   09/27/2015,11:37 AM

## 2015-09-27 NOTE — Progress Notes (Signed)
Physical Therapy Session Note  Patient Details  Name: [name] MRN: [id number] Date of Birth: Nov 18, 1960  Today's Date: 09/27/2015 PT Individual Time: 1400-1500 PT Individual Time Calculation (min): 60 min   Short Term Goals: Week 1:  PT Short Term Goal 1 (Week 1): STG=LTG due to short LOS  Skilled Therapeutic Interventions/Progress Updates:   Session focused on addressing functional mobility tasks including balance, transfers, short distance gait, and cognition. Pt's impaired cognition limiting overall mobility with deficits noted with attention, problem solving, sequencing, and safety awareness as well as overall lethargy requiring up to max verbal cues. Pt performed basic transfers at close supervision to steady assist level with cues for set-up and sequencing from various surfaces including mat, power w/c, simulated car, and toilet. Engaged in cognitive activity while address sitting and standing balance but difficulty following more than single step commands to match bean bag with color on colored circle on floor despite multimodal cues. End of session returned to room (total assist for pathfinding back to room (second time this session) and safety belt donned while pt requesting to stay up in power w/c. Pt able to manipulate power w/c at supervision level.   Therapy Documentation Precautions:  Precautions Precautions: Fall Precaution Comments: watch HR Required Braces or Orthoses: Other Brace/Splint Other Brace/Splint: R AK prosthetic limb Restrictions Weight Bearing Restrictions: Yes RLE Weight Bearing: Non weight bearing   Pain:  Denies pain.   See Function Navigator for Current Functional Status.   Therapy/Group: Individual Therapy  [name], PT, DPT  09/27/2015, 3:19 PM

## 2015-09-27 NOTE — Discharge Instructions (Addendum)
Information on my medicine - Coumadin   (Warfarin)  This medication education was reviewed with me or my healthcare representative as part of my discharge preparation.    Why was Coumadin prescribed for you? Coumadin was prescribed for you because you have a blood clot or a medical condition that can cause an increased risk of forming blood clots. Blood clots can cause serious health problems by blocking the flow of blood to the heart, lung, or brain. Coumadin can prevent harmful blood clots from forming. As a reminder your indication for Coumadin is:   Deep Vein Thrombosis Treatment  What test will check on my response to Coumadin? While on Coumadin (warfarin) you will need to have an INR test regularly to ensure that your dose is keeping you in the desired range. The INR (international normalized ratio) number is calculated from the result of the laboratory test called prothrombin time (PT).  If an INR APPOINTMENT HAS NOT ALREADY BEEN MADE FOR YOU please schedule an appointment to have this lab work done by your health care provider within 7 days. Your INR goal is usually a number between:  2 to 3 or your provider may give you a more narrow range like 2-2.5.  Ask your health care provider during an office visit what your goal INR is.  What  do you need to  know  About  COUMADIN? Take Coumadin (warfarin) exactly as prescribed by your healthcare provider about the same time each day.  DO NOT stop taking without talking to the doctor who prescribed the medication.  Stopping without other blood clot prevention medication to take the place of Coumadin may increase your risk of developing a new clot or stroke.  Get refills before you run out.  What do you do if you miss a dose? If you miss a dose, take it as soon as you remember on the same day then continue your regularly scheduled regimen the next day.  Do not take two doses of Coumadin at the same time.  Important Safety Information A possible  side effect of Coumadin (Warfarin) is an increased risk of bleeding. You should call your healthcare provider right away if you experience any of the following: ? Bleeding from an injury or your nose that does not stop. ? Unusual colored urine (red or dark brown) or unusual colored stools (red or black). ? Unusual bruising for unknown reasons. ? A serious fall or if you hit your head (even if there is no bleeding).  Some foods or medicines interact with Coumadin (warfarin) and might alter your response to warfarin. To help avoid this: ? Eat a balanced diet, maintaining a consistent amount of Vitamin K. ? Notify your provider about major diet changes you plan to make. ? Avoid alcohol or limit your intake to 1 drink for women and 2 drinks for men per day. (1 drink is 5 oz. wine, 12 oz. beer, or 1.5 oz. liquor.)  Make sure that ANY health care provider who prescribes medication for you knows that you are taking Coumadin (warfarin).  Also make sure the healthcare provider who is monitoring your Coumadin knows when you have started a new medication including herbals and non-prescription products.  Coumadin (Warfarin)  Major Drug Interactions  Increased Warfarin Effect Decreased Warfarin Effect  Alcohol (large quantities) Antibiotics (esp. Septra/Bactrim, Flagyl, Cipro) Amiodarone (Cordarone) Aspirin (ASA) Cimetidine (Tagamet) Megestrol (Megace) NSAIDs (ibuprofen, naproxen, etc.) Piroxicam (Feldene) Propafenone (Rythmol SR) Propranolol (Inderal) Isoniazid (INH) Posaconazole (Noxafil) Barbiturates (Phenobarbital) Carbamazepine (Tegretol)  Chlordiazepoxide (Librium) Cholestyramine (Questran) Griseofulvin Oral Contraceptives Rifampin Sucralfate (Carafate) Vitamin K   Coumadin (Warfarin) Major Herbal Interactions  Increased Warfarin Effect Decreased Warfarin Effect  Garlic Ginseng Ginkgo biloba Coenzyme Q10 Green tea St. Johns wort    Coumadin (Warfarin) FOOD Interactions  Eat  a consistent number of servings per week of foods HIGH in Vitamin K (1 serving =  cup)  Collards (cooked, or boiled & drained) Kale (cooked, or boiled & drained) Mustard greens (cooked, or boiled & drained) Parsley *serving size only =  cup Spinach (cooked, or boiled & drained) Swiss chard (cooked, or boiled & drained) Turnip greens (cooked, or boiled & drained)  Eat a consistent number of servings per week of foods MEDIUM-HIGH in Vitamin K (1 serving = 1 cup)  Asparagus (cooked, or boiled & drained) Broccoli (cooked, boiled & drained, or raw & chopped) Brussel sprouts (cooked, or boiled & drained) *serving size only =  cup Lettuce, raw (green leaf, endive, romaine) Spinach, raw Turnip greens, raw & chopped   These websites have more information on Coumadin (warfarin):  FailFactory.se; VeganReport.com.au;  Additional Information on my medicine:  Your Evotaz (atazanavir + cobicistat) has been changed to [name] (darunavir + cobicistat) d/t a drug interaction with his newly started Protonix. If you will continue on Protonix at discharge, please take [name] INSTEAD of Evotaz. Continue [name] and [name] as how you have been taking it previously. Inpatient Rehab Discharge Instructions  [name] Discharge date and time: No discharge date for patient encounter.   Activities/Precautions/ Functional Status: Activity: activity as tolerated Diet: soft Wound Care: keep wound clean and dry Functional status:  ___ No restrictions     ___ Walk up steps independently _x__ 24/7 supervision/assistance   ___ Walk up steps with assistance ___ Intermittent supervision/assistance  ___ Bathe/dress independently ___ Walk with walker     ___ Bathe/dress with assistance ___ Walk Independently    ___ Shower independently ___ Walk with assistance    ___ Shower with assistance ___ No alcohol     ___ Return to work/school ________     COMMUNITY REFERRALS UPON DISCHARGE:      Home Health:   PT      RN                     Agency:  [facility] Phone: [phone]       Special Instructions: santyl to sacral ulcer daily   Home health nurse to check INR on 10/01/2015 results to Dr. Tomasa Hose [phone]   My questions have been answered and I understand these instructions. I will adhere to these goals and the provided educational materials after my discharge from the hospital.  Patient/Caregiver Signature _______________________________ Date __________  Clinician Signature _______________________________________ Date __________  Please bring this form and your medication list with you to all your follow-up doctor's appointments.

## 2015-09-27 NOTE — Plan of Care (Signed)
Pt's goals downgraded to overall supervision level due to safety concerns. See POC for detailed goal list.

## 2015-09-28 ENCOUNTER — Inpatient Hospital Stay (HOSPITAL_COMMUNITY): Payer: Medicare Other | Admitting: Occupational Therapy

## 2015-09-28 ENCOUNTER — Inpatient Hospital Stay (HOSPITAL_COMMUNITY): Payer: Medicare Other

## 2015-09-28 DIAGNOSIS — R4182 Altered mental status, unspecified: Secondary | ICD-10-CM

## 2015-09-28 LAB — URINALYSIS, ROUTINE W REFLEX MICROSCOPIC
Bilirubin Urine: NEGATIVE
Glucose, UA: >1000 mg/dL — AB
KETONES UR: NEGATIVE mg/dL
LEUKOCYTES UA: NEGATIVE
NITRITE: NEGATIVE
PH: 7.5 (ref 5.0–8.0)
Protein, ur: 100 mg/dL — AB
SPECIFIC GRAVITY, URINE: 1.024 (ref 1.005–1.030)

## 2015-09-28 LAB — BASIC METABOLIC PANEL
ANION GAP: 6 (ref 5–15)
BUN: 20 mg/dL (ref 6–20)
CHLORIDE: 116 mmol/L — AB (ref 101–111)
CO2: 19 mmol/L — AB (ref 22–32)
Calcium: 9.1 mg/dL (ref 8.9–10.3)
Creatinine, Ser: 1.11 mg/dL (ref 0.61–1.24)
GFR calc non Af Amer: >60 mL/min (ref >60)
Glucose, Bld: 114 mg/dL — ABNORMAL HIGH (ref 65–99)
Potassium: 3.5 mmol/L (ref 3.5–5.1)
Sodium: 141 mmol/L (ref 135–145)

## 2015-09-28 LAB — PROTIME-INR
INR: 1.21 (ref 0.00–1.49)
Prothrombin Time: 15.4 seconds — ABNORMAL HIGH (ref 11.6–15.2)

## 2015-09-28 LAB — URINE MICROSCOPIC-ADD ON

## 2015-09-28 MED ORDER — WARFARIN SODIUM 5 MG PO TABS
5.0000 mg | ORAL_TABLET | Freq: Once | ORAL | Status: AC
  Administered 2015-09-28: 5 mg via ORAL
  Filled 2015-09-28: qty 1

## 2015-09-28 MED ORDER — MORPHINE SULFATE ER 15 MG PO TBCR: 30.0000 mg | EXTENDED_RELEASE_TABLET | Freq: Two times a day (BID) | ORAL | Status: DC

## 2015-09-28 MED ORDER — RISPERIDONE 0.25 MG PO TABS
0.2500 mg | ORAL_TABLET | Freq: Every day | ORAL | Status: DC
  Filled 2015-09-28 (×2): qty 1

## 2015-09-28 NOTE — Progress Notes (Signed)
Physical Therapy Session Note  Patient Details  Name: [name] MRN: [id number] Date of Birth: April 01, 1961  Today's Date: 09/28/2015 PT Individual Time: [id number] PT Individual Time Calculation (min): 30 min   Short Term Goals: Week 1:  PT Short Term Goal 1 (Week 1): STG=LTG due to short LOS  Skilled Therapeutic Interventions/Progress Updates:    Session focused on initiating family education with pt's daughter, [name], in regards to basic transfers (squat pivot and stand pivot with RW), short distance gait to simulate home environment, simulated car transfer to [employer], and education on cognition affecting mobility. Pt much clearer cognitively this session and minimal cues needed throughout session but this clinician expressed concern to daughter in regards to how cognition has fluctuated the last couple days and the effect on mobility it has had. [name] verbalized understanding and denies any questions at this time. Family education planned for Thursday. [name] reports that motorized w/c fits into all rooms of the home and pt reports he plans on using this primarily for mobility and minimal use of RW. Downgraded LTG for gait distance to reflect this. See care plan for details.    Therapy Documentation Precautions:  Precautions Precautions: Fall Precaution Comments: watch HR Required Braces or Orthoses: Other Brace/Splint Other Brace/Splint: R AK prosthetic limb Restrictions Weight Bearing Restrictions: Yes RLE Weight Bearing: Non weight bearing  Pain:     See Function Navigator for Current Functional Status.   Therapy/Group: Individual Therapy  [name], PT, DPT  09/28/2015, 3:51 PM

## 2015-09-28 NOTE — Progress Notes (Signed)
   09/28/15 0214  What Happened  Was fall witnessed? No  Was patient injured? No  Patient found on floor  Found by Staff-comment  Stated prior activity other (comment) (unknown; last in motorized wheelchair with safety belt)  Follow Up  MD notified yes Jeannene Patella Love, PA)  Time MD notified [phone]  Family notified Yes-comment [name], daughter)  Time family notified 0240  Additional tests No  Progress note created (see row info) Yes  Adult Fall Risk Assessment  Risk Factor Category (scoring not indicated) Fall has occurred during this admission (document High fall risk)  Patient's Fall Risk High Fall Risk (>13 points)  Adult Fall Risk Interventions  Required Bundle Interventions *See Row Information* High fall risk - low, moderate, and high requirements implemented  Additional Interventions Fall risk signage;Individualized elimination schedule;Use of appropriate toileting equipment (bedpan, BSC, etc.)  Fall with Injury Screening  Risk For Fall Injury- See Row Information  Nurse judgement  Vitals  Temp 97.9 F (36.6 C)  Temp Source Oral  BP 116/69 mmHg  BP Location Left Arm  BP Method Automatic  Patient Position (if appropriate) Lying  Pulse Rate 96  Pulse Rate Source Dinamap  Resp 16  Oxygen Therapy  SpO2 97 %  O2 Device Room Air  Pain Assessment  Pain Assessment No/denies pain  Pain Score 0  Neurological  Neuro (WDL) X  Level of Consciousness Alert  Orientation Level Oriented to person;Oriented to place;Disoriented to time;Disoriented to situation  Herbalist  Pupil Assessment  No  RUE Motor Response Purposeful movement  LUE Motor Response Purposeful movement  RLE Motor Response Purposeful movement (old R AKA)  LLE Motor Response Purposeful movement  Neuro Symptoms Agitation (at times)  Musculoskeletal  Musculoskeletal (WDL) X  Assistive Device Wheelchair (motorized)  Generalized Weakness Yes  RLE  Weight Bearing NWB  Musculoskeletal Details  RLE AKA  Right Lower Leg Ortho/Supportive Device  Right Lower Leg Ortho/Supportive Device Prosthesis (unable to wear d/t fit)  Integumentary  Integumentary (WDL) X (no new abrasions, ecchymosis, skin tears, lesions, etc)  Skin Condition Dry;Flaky  Skin Integrity Other (Comment);Amputation (R AKA; unstageable R buttock)  Amputation Location Leg (R AKA)  Amputation Location Orientation Right    RN on floor heard noise from patient room quickly followed by patient calling out for help.  That RN in to room, finding patient in floor by motorized wheelchair and sink. RN called out for this RN for more help, as well as NT.  Charge RN called to bring lift equipment.  Vitals taken before patient moved to bed, stable: 97.9 temp; BP 116/69; 96 HR, 16 resp; 97 O2.  No complaints of pain.  Patient first denies falling- later stating "I just fell" when asked what happened.  Patient was left in room in motorized wheelchair with pink safety belt on.  RN and NT had made multiple attempts each to get patient in bed rather than in recliner or wheelchair; patient refusing each time.  Patient found in bathroom x1 on own, re-educated about safety precautions and plan.  Patient refusing to stay in bed; moved to recliner with safety belt and brought to nursing station.  Algis Liming, PA aware of situation [phone], no new orders received.  Patient daughter [name] called [phone], questions answered, no other concerns.  Patient resting at nursing station. Will continue to monitor patient.

## 2015-09-28 NOTE — Progress Notes (Signed)
Occupational Therapy Session Note  Patient Details  Name: [name] MRN: [id number] Date of Birth: 01/09/1961  Today's Date: 09/28/2015 OT Individual Time: 1000-1058 and 1300-1400 OT Individual Time Calculation (min): 58 min and 60 min   Short Term Goals: Week 1:  OT Short Term Goal 1 (Week 1): Short term goals = long term goals  Skilled Therapeutic Interventions/Progress Updates:    Session One: Pt seen for OT therapy session focusing on functional standing balance and ADL re-training. Pt sitting in w/c upon arrival, declining bathing/dressing session due to being cold. Encouraged pt to participate in ADL re-training, however, he cont to refuse. He completed sit <> stands at RW to assist with decorating holiday tree with emphasis on standing balance. Pt able to stand with one UE support and steadying support to reach across midline and overhead to reach tree. Pt declined attempting to use B UEs to reach tree due to fear of falling. He completed x3 sit <> stands throughout task, tolerating standing at most ~1 minute before requesting seated rest break. He then sat on edge of w/c unsupported to complete task for core strengthening/ stability. During seated task worked with pt on re-orientation, as pt unable to correctly identify year and had difficulty maintaining casual conversation due to confusion. In room, pt ambulated into bathroom for toileting task, requiring increased assist for sit <> stand from standard toilet due to low sitting surface. Grooming completed from w/c level at sink supervision- mod I. Pt left sitting in w/c at end of session, QRB donned and all needs in reach.  Discussed fall from last night with pt. Pt stated he was just sitting in his chair with QRB on, and just fell out of his chair. When therapist cont to ask questions, pt became slightly agitated and refused to discuss fall any further.   Session Two: Pt seen for OT therapy session focusing on functional mobility  and transfers. Pt in supine upon arrival, agreeable to tx session.  In ADL apartment, pt completed simulated tub bench transfer, requiring max VCs for sequencing of transfer. Pt reported not liking bench due to excessive sliding on sacral wound. Time did not allow for pt to attempt shower chair transfer, which he said he did at home by completing squat pivot from w/c to shower chair. Will attempt at next therapy session. He then completed standing toileting task. He became slightly agitated with therapist when insisting he use RW to stabilize while standing at toilet, however, pt refused instead holding onto grab bars and top of toilet.  Educated throughout session regarding fall risk, safety awareness, and d/c planning.  Pt's daughter arrived at end of session, recommend her coming back for hands on training before d/c.   Therapy Documentation Precautions:  Precautions Precautions: Fall Precaution Comments: watch HR Required Braces or Orthoses: Other Brace/Splint Other Brace/Splint: R AK prosthetic limb Restrictions Weight Bearing Restrictions: Yes RLE Weight Bearing: Non weight bearing Pain: Pain Assessment Pain Assessment: No/denies pain Pain Score: 0-No pain  See Function Navigator for Current Functional Status.   Therapy/Group: Individual Therapy  Lewis, Sanjna Haskew C 09/28/2015, 7:14 AM

## 2015-09-28 NOTE — Progress Notes (Signed)
Physical Therapy Session Note  Patient Details  Name: [name] MRN: [id number] Date of Birth: 08/30/1961  Today's Date: 09/28/2015 PT Individual Time: 0800-0927 PT Individual Time Calculation (min): 87 min   Short Term Goals: Week 1:  PT Short Term Goal 1 (Week 1): STG=LTG due to short LOS  Skilled Therapeutic Interventions/Progress Updates:   Discussed fall from last night while pt finishing breakfast. Pt reports he wasn't doing anything in particular but just fell out of the chair. Denies this happening at home PTA. Reviewed safety precautions.  Therapist changed out cushion to use in power w/c (pt using personal J Basic cushion) to Roho cushion and then J2 deep contour due to pt not liking the feel of the Roho. Educated on importance of position changes for pressure relief when sitting up in chair due to skin breakdown. Pt verbalized understanding.   Addressed functional transfers throughout session (squat pivot as well as stand pivot with RW at close supervision level with cues for safety), functional gait training for household distances for strengthening and mobility training including turning with close supervision to steady assist during turn, supine therex for stretching to hip flexors and strengthening exercises but limited tolerance in supine or sidelying position, and dynamic standing and sitting balance activity to change shorts from w/c. Throughout session, pt requires cues for sequencing, memory and problem solving. Improved attention today as pt more alert during session than when this clinician worked with this patient yesterday. Safety belt donned at end of session.  Therapy Documentation Precautions:  Precautions Precautions: Fall Precaution Comments: watch HR Required Braces or Orthoses: Other Brace/Splint Other Brace/Splint: R AK prosthetic limb Restrictions Weight Bearing Restrictions: Yes RLE Weight Bearing: Non weight bearing   Pain: Pain Assessment Pain  Assessment: No/denies pain Pain Score: 0-No pain   See Function Navigator for Current Functional Status.   Therapy/Group: Individual Therapy  [name], PT, DPT  09/28/2015, 1:10 PM

## 2015-09-28 NOTE — Progress Notes (Signed)
Nurse and charge nurse educated patient on safety awareness. Patient refuses to cooperate. adm

## 2015-09-28 NOTE — Consult Note (Signed)
NEUROBEHAVIORAL STATUS EXAM - [facility]   MEDICAL NECESSITY:  [name] was seen on the [facility] for a neurobehavioral status exam owing to the patient's diagnosis of debility, and to assist in treatment planning during admission.   Records indicate that [name] is a "54 y.o. 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 Patient lives with his 32 year old daughter independent with assistive device prior to admission and prosthesis. Pt's daughter is planning on going to work and is in the process of trying to obtain some assistance for her father.Presented in transfer from [facility] 09/10/2015 with dark tarry stools and hematemesis. INR greater than 10 and reversed. Found to be acidotic, hypernatremic/hypokalemic required intubation. Chest x-ray bilateral pneumonia question aspiration. Placed on broad-spectrum antibiotics. He was transfused. Blood cultures positive for Klebsiella. KUB of the abdomen showed partial small bowel obstruction requiring nasogastric tube."   During today's visit, [name] presented as a very diminutive AA male, with prior right AKA in a motorized wheelchair. He denied experiencing any cognitive difficulties for any reason. However, he was unable to recall much about his history stating that he was in the hospital because he "got sick." When telling a different story he also could not recall his own address. He complained of sleep disturbance in that he falls asleep easily but will variably wake up throughout the night for no apparent reason. He then said that this actually does not bother him. Patient later mentioned that he was unsure why he was "locked up here and not his daughter." He could not elaborate on his meaning. It was later ascertained that he thought he was in a rehab facility for drugs and alcohol, so the present situation was  explained to him.   From an emotional standpoint, [name] admitted to a history of treatment for depression and anxiety. He described one instance 20-25 years ago when he psychiatrically hospitalized after a nervous breakdown, but the details of his story were not consistent. In fact, the details of his psychiatric history are unclear, though there are indications that he has been treated, which included counseling, but he is not treated at present.  No adjustment issues endorsed. Suicidal/homicidal ideation, plan or intent was denied. No manic or hypomanic episodes were reported. The patient denied ever experiencing any auditory/visual hallucinations. No major behavioral or personality changes were endorsed.   Patient feels that he is making some progress in therapy. He indicated that he may have been displeased with the rehab staff at some point because they were being overzealous about cleaning up his bodily fluids, though this has resolved. He said that he is here just to please his daughter. He was living with his daughter and he plans to discharge home to her care. Cognitive issues could be a barrier to his therapy.   PROCEDURES: [2 units 96116] Diagnostic clinical interview  Review of available records Mental Status Exam  MENTAL STATUS EXAM: APPEARANCE:  Very diminutive; gaunt GEN:  disoriented to time and situation MOOD: somewhat irritable        AFFECT:  Flat  SPEECH:  tangential      THOUGHT CONTENT:  quick to get off topic and lose his train of thought HALLUCINATIONS:  None INTELLIGENCE:  Below average  INSIGHT:  Poor JUDGMENT:  Poor SUICIDAL IDEATION:  Denies SI   HOMICIDAL IDEATION:   Denies HI   IMPRESSION: [name] denied suffering from  any cognitive issues, though it was clear via the assessment that some degree of deficit exist. Probable encephalopathy; could be related to HIV status (e.g., HIV dementia), or related to his present medical situation, but the exact  etiology is not clear. Further evaluation should be considered by his rehab care providers. Emotionally, he is not thrilled to be in rehab but he has been compliant, but there are several indicators suggesting he is not sure why he is here. In fact, he is not entirely sure why he has been in the hospital.    At present, I recommend further investigation into the possible etiologies of his cognitive impairments. Given poor insight, counseling does not appear appropriate right now. I do plan to informally follow-up with the patient throughout this admission and further recommendations will be offered if he cognitively clears.     [name], Psy.D.  Clinical Neuropsychologist

## 2015-09-28 NOTE — Progress Notes (Signed)
Patient refused all medications at bedtime. Patient not cooperating with staff, getting out of bed without assistance, and refusing to go back to bed. adm

## 2015-09-28 NOTE — Plan of Care (Signed)
Problem: RH SAFETY Goal: RH STG ADHERE TO SAFETY PRECAUTIONS W/ASSISTANCE/DEVICE STG Adhere to Safety Precautions With Assistance/Device. Min A  Outcome: Not Progressing Patient with poor safety awareness. adm

## 2015-09-28 NOTE — Progress Notes (Signed)
ANTICOAGULATION CONSULT NOTE - Initial Consult  Pharmacy Consult for Coumadin Indication: History of DVT  Allergies  Allergen Reactions  . Dapsone     REACTION: fever  . Sulfamethoxazole-Trimethoprim     REACTION: fever    Patient Measurements: Height: 6' (182.9 cm) Weight:  (unable to get; pt not in bed) IBW/kg (Calculated) : 77.6  Vital Signs: Temp: 97.7 F (36.5 C) (11/29 1240) Temp Source: Oral (11/29 1240) BP: 130/66 mmHg (11/29 1240) Pulse Rate: 105 (11/29 1240)  Labs:  Recent Labs  09/26/15 0510 09/26/15 0950 09/27/15 0713 09/28/15 0527  HGB  --  9.9*  --   --   HCT  --  29.5*  --   --   PLT  --  403*  --   --   LABPROT 15.4*  --  16.4* 15.4*  INR 1.20  --  1.31 1.21  CREATININE  --   --   --  1.11    Estimated Creatinine Clearance: 45.6 mL/min (by C-G formula based on Cr of 1.11).   Medical History: Past Medical History  Diagnosis Date  . History of chicken pox   . Hyperlipidemia   . History of DVT of lower extremity     right  . Left hip pain 03/09/2013  . Pneumonia   . Chronic kidney disease   . Protein malnutrition ([place]) 08/2015  . HIV (human immunodeficiency virus infection) [facility])     Assessment: 54 y/o M transfered from [facility]. Continue on coumadin for history of unprovoked DVT that led to R AKA. INR = 1.21 today, remains subtherapeutic, not increasing much. He is also on lovenox 30 mg sq daily.   PTA dose: 3mg  daily but INR 10 at [facility], recent GIB  Goal of Therapy:  INR 2-3 Monitor platelets by anticoagulation protocol: Yes   Plan:  - Coumadin 5mg  po x 1 - Daily PT/INR - Continue Lovenox to 30 mg sq daily until INR is therapeutic  Maryanna Shape, PharmD, BCPS  Clinical Pharmacist  Pager: [phone]   09/28/2015,2:20 PM

## 2015-09-28 NOTE — Progress Notes (Signed)
[facility]ILIT[facility]     PROGRESS NOTE    Subjective/Complaints: Up last night. "need to go to bathroom" confused, more confused this morning.  ROS limited by mood/cognition  Objective: Vital Signs: Blood pressure 107/61, pulse 102, temperature 97.9 F (36.6 C), temperature source Oral, resp. rate 17, height 6' (1.829 m), weight 42.366 kg (93 lb 6.4 oz), SpO2 100 %. No results found.  Recent Labs  09/26/15 0950  WBC 10.5  HGB 9.9*  HCT 29.5*  PLT 403*    Recent Labs  09/28/15 0527  NA 141  K 3.5  CL 116*  GLUCOSE 114*  BUN 20  CREATININE 1.11  CALCIUM 9.1   CBG (last 3)  No results for input(s): GLUCAP in the last 72 hours.  Wt Readings from Last 3 Encounters:  09/27/15 42.366 kg (93 lb 6.4 oz)  09/22/15 41.776 kg (92 lb 1.6 oz)  08/06/15 53.615 kg (118 lb 3.2 oz)    Physical Exam:  Constitutional: No distress.  Cachectic   HENT:  Head: Normocephalic and atraumatic.  Eyes: Conjunctivae and EOM are normal.  Neck: Normal range of motion. No thyromegaly present.  Cardiovascular: Normal rate and regular rhythm.  Respiratory: Effort normal and breath sounds normal.  GI: Soft. Bowel sounds are normal. He exhibits no distension.  Musculoskeletal: He exhibits no edema or tenderness.  Right AKA  Neurological: He is alert and oriented to person and place.  Sensation intact to light touch throughout Motor: B/l UE 4/5 proximal to distal LLE: 4+/5 proximal to distal Right hip flexion 4/5.  Skin: Skin is warm and dry.  AKA site is healed. Sacral ulcer dressed Psychiatric: disoriented, confused. Does follow simple commands. Trying to put right AK stump in urinal.  Assessment/Plan: 1. Weakness/ debility related to multiple medical issues/HIV which require 3+ hours per day of interdisciplinary therapy in a comprehensive inpatient rehab setting. Physiatrist is providing close team supervision and 24 hour management of active  medical problems listed below. Physiatrist and rehab team continue to assess barriers to discharge/monitor patient progress toward functional and medical goals.  Function:  Bathing Bathing position   Position: Shower (sitting on tub transfer bench)  Bathing parts Body parts bathed by patient: Right arm, Left arm, Chest, Abdomen, Right upper leg, Left upper leg, Left lower leg Body parts bathed by helper: Buttocks, Back  Bathing assist Assist Level: Touching or steadying assistance(Pt > 75%), Set up (patient completed 7/9 parts, 78%, FIM level 4, min assist)   Set up : To obtain items  Upper Body Dressing/Undressing Upper body dressing   What is the patient wearing?: Pull over shirt/dress     Pull over shirt/dress - Perfomed by patient: Thread/unthread right sleeve, Thread/unthread left sleeve, Put head through opening Pull over shirt/dress - Perfomed by helper: Pull shirt over trunk        Upper body assist Assist Level: Set up (3/4 steps, 75%, min assist)   Set up : To obtain clothing/put away  Lower Body Dressing/Undressing Lower body dressing   What is the patient wearing?: Pants, Shoes, [employer], Underwear Underwear - Performed by patient: Thread/unthread right underwear leg, Thread/unthread left underwear leg Underwear - Performed by helper: Pull underwear up/down Pants- Performed by patient: Thread/unthread right pants leg, Thread/unthread left pants leg, Fasten/unfasten pants Pants- Performed by helper: Pull pants up/down Non-skid slipper socks- Performed by patient: Don/doff left sock   Socks - Performed by patient: Don/doff left sock   Shoes - Performed by patient:  Don/doff left shoe Shoes - Performed by helper: Don/doff left shoe, Fasten left       TED Hose - Performed by helper: Don/doff left TED hose  Lower body assist Assist for lower body dressing:  (Mod assist)      Toileting Toileting     Toileting steps completed by helper: Adjust clothing prior to  toileting, Performs perineal hygiene, Adjust clothing after toileting Toileting Assistive Devices: Grab bar or rail  Toileting assist Assist level: Touching or steadying assistance (Pt.75%)   Transfers Chair/bed transfer   Chair/bed transfer method: Squat pivot Chair/bed transfer assist level: Touching or steadying assistance (Pt > 75%) Chair/bed transfer assistive device: Armrests     Locomotion Ambulation     Max distance: 10 Assist level: Touching or steadying assistance (Pt > 75%)   Wheelchair   Type: Motorized Max wheelchair distance: 150 Assist Level: Supervision or verbal cues  Cognition Comprehension Comprehension assist level: Understands basic 75 - 89% of the time/ requires cueing 10 - 24% of the time  Expression Expression assist level: Expresses basic needs/ideas: With extra time/assistive device  Social Interaction Social Interaction assist level: Interacts appropriately 75 - 89% of the time - Needs redirection for appropriate language or to initiate interaction.  Problem Solving Problem solving assist level: Solves basic 50 - 74% of the time/requires cueing 25 - 49% of the time  Memory Memory assist level: Recognizes or recalls 50 - 74% of the time/requires cueing 25 - 49% of the time   Medical Problem List and Plan: 1. Debilitation/weakness related to Klebsiella pneumonia bacteremia HIV/right AKA/small bowel obstruction/upper GI bleed   -continue CIR therapies  -will ask [employer] to assess right AK prosthesis for fit 2. DVT Prophylaxis/Anticoagulation/history DVT: resumed warfarin. Appreciate pharmacy's assistance in further titration.   Can continue low dose lovenox until inr therapeutic 3. Pain Management: Oxycodone as needed.   -resumed ms contin 60mg  q12----reduce to 30mg . (was taking 120mg  at home per Dr. Johnnye Sima)  4. Acute on chronic anemia. Follow-up CBC  --hgb 10 . Chronic Coumadin resumed.  5. Neuropsych: This patient isnot capable of making decisions on  his own behalf.  -continued issues with sleep, tends to sundown. Worse last night into today.  -recent lab work including today reviewed and negative  -will check urine specimen  -reduce ms contin to 30mg   -stop seroquel, try low dose risperdal for sleep/hs confusion.  -certainly could be HIV component to presentation 6. Skin/Wound Care/stage II pressure ulcer: Routine skin checks . Foam dressing as recommended by wound care nurse  -improve nutrition  -air mattress overlay for bed  -continued patient education 7. Fluids/Electrolytes/Nutrition: encourage po  -added megace for appetite    -I personally reviewed the patient's labs today.   -removed central line 8. Acute on chronic kidney disease/hyponatremia/hypokalemia. Likely secondary to ATN. Follow-up renal services 9. HIV. CD4 decreased from 700 to 219. Antivirals have been resumed.  -wbc's  10.5k   10. Insomnia: increased seroquel to 50mg  qhs with some benefit  .  LOS (Days) 6 A FACE TO FACE EVALUATION WAS PERFORMED  SWARTZ,ZACHARY T 09/28/2015 8:11 AM

## 2015-09-28 NOTE — Plan of Care (Signed)
Problem: RH PAIN MANAGEMENT Goal: RH STG PAIN MANAGED AT OR BELOW PT'S PAIN GOAL <5  Outcome: Progressing No c/o pain     

## 2015-09-28 NOTE — Plan of Care (Signed)
Problem: RH SKIN INTEGRITY Goal: RH STG MAINTAIN SKIN INTEGRITY WITH ASSISTANCE STG Maintain Skin Integrity With Assistance. Mod A  Outcome: Progressing Daily dressing change to buttock  Problem: RH SAFETY Goal: RH STG ADHERE TO SAFETY PRECAUTIONS W/ASSISTANCE/DEVICE STG Adhere to Safety Precautions With Assistance/Device. Mod I  Outcome: Not Progressing Patient refusing to go to bed during night, staying in motorized wheelchair or recliner with safety belt on but taking safety belt off on own.  Staff re-implementing safety precautions and plan with patient constantly.  Patient fall 0215.

## 2015-09-29 ENCOUNTER — Inpatient Hospital Stay (HOSPITAL_COMMUNITY): Payer: Medicare Other | Admitting: Physical Therapy

## 2015-09-29 ENCOUNTER — Inpatient Hospital Stay (HOSPITAL_COMMUNITY): Payer: Medicare Other

## 2015-09-29 ENCOUNTER — Inpatient Hospital Stay (HOSPITAL_COMMUNITY): Payer: Medicare Other | Admitting: Occupational Therapy

## 2015-09-29 LAB — PROTIME-INR
INR: 1.27 (ref 0.00–1.49)
PROTHROMBIN TIME: 16.1 s — AB (ref 11.6–15.2)

## 2015-09-29 LAB — URINE CULTURE: Culture: 6000

## 2015-09-29 MED ORDER — OXYCODONE HCL 5 MG PO TABS: 5.0000 mg | ORAL_TABLET | Freq: Four times a day (QID) | ORAL | Status: DC | PRN

## 2015-09-29 MED ORDER — WARFARIN SODIUM 5 MG PO TABS: 5.0000 mg | ORAL_TABLET | Freq: Every day | ORAL | Status: DC

## 2015-09-29 MED ORDER — K PHOS MONO-SOD PHOS DI & MONO 155-852-130 MG PO TABS: 750.0000 mg | ORAL_TABLET | Freq: Two times a day (BID) | ORAL | Status: DC

## 2015-09-29 MED ORDER — DOLUTEGRAVIR SODIUM 50 MG PO TABS: 50.0000 mg | ORAL_TABLET | Freq: Every day | ORAL | Status: DC

## 2015-09-29 MED ORDER — MORPHINE SULFATE ER 30 MG PO TBCR: 30.0000 mg | EXTENDED_RELEASE_TABLET | Freq: Two times a day (BID) | ORAL | Status: DC

## 2015-09-29 MED ORDER — WARFARIN SODIUM 5 MG PO TABS
5.0000 mg | ORAL_TABLET | Freq: Every day | ORAL | Status: DC
Start: 2015-09-29 — End: 2016-12-25

## 2015-09-29 MED ORDER — FENOFIBRATE 145 MG PO TABS
145.0000 mg | ORAL_TABLET | Freq: Every day | ORAL | Status: DC
Start: 2015-09-29 — End: 2015-10-21

## 2015-09-29 NOTE — Consult Note (Signed)
   [facility] CM Inpatient Consult   09/29/2015  [name] [birth year] [id number] Late entry from 1: Email referral received for [facility] Care Management. Patient discharged prior to this writer assessment for [facility] Care Management.  Will have community staff follow up and offer services. For questions, please contact: Natividad Brood, RN BSN [facility] Liaison  [phone] business mobile phone

## 2015-09-29 NOTE — Discharge Summary (Signed)
Discharge summary job # [id number]

## 2015-09-29 NOTE — Progress Notes (Signed)
Pt. And daughter got d/c papers and follow up instructions.Also his prescriptions as well.Pt. Is ready to go home with his daughter.

## 2015-09-29 NOTE — Patient Care Conference (Signed)
Inpatient RehabilitationTeam Conference and Plan of Care Update Date: 09/28/2015   Time: 2:05 PM    Patient Name: [name]      Medical Record Number: [id number]  Date of Birth: 1961-10-15 Sex: Male         Room/Bed: 4M03C/4M03C-01 Payor Info: Payor: MEDICARE / Plan: MEDICARE PART A AND B / Product Type: *No Product type* /    Admitting Diagnosis: Debility  Admit Date/Time:  09/22/2015  3:53 PM Admission Comments: No comment available   Primary Diagnosis:  <principal problem not specified> Principal Problem: <principal problem not specified>  Patient Active Problem List   Diagnosis Date Noted  . Debilitated 09/22/2015  . Renal tubular acidosis   . AIDS ([place])   . AKI (acute kidney injury) ([name])   . Acute blood loss anemia   . History of above knee amputation ([place])   . Hyponatremia   . Hypophosphatemia   . SBO (small bowel obstruction) ([place])   . Hyperosmolality and/or hypernatremia   . Hypomagnesemia   . Pressure ulcer 09/13/2015  . Protein-calorie malnutrition, severe 09/13/2015  . Abdominal pain   . Pneumonia due to Klebsiella pneumoniae ([place])   . Septic shock ([place]) 09/10/2015  . Gram-negative bacteremia ([place]) 09/10/2015  . Polyuria 08/06/2015  . Prediabetes 08/06/2015  . Hematuria 08/06/2015  . UTI (urinary tract infection) 05/14/2015  . Avascular necrosis of femoral head ([place]) 05/14/2015  . Left adrenal mass ([place]) 05/14/2015  . Hypokalemia 10/08/2014  . CKD (chronic kidney disease) 02/10/2014  . Hyperglycemia 12/29/2013  . Hyperbilirubinemia 12/29/2013  . Unintentional weight loss 11/04/2013  . Left hip pain 03/09/2013  . Preventative health care 03/09/2013  . Phantom limb syndrome with pain ([place]) 03/21/2012  . Long term current use of anticoagulant 11/20/2010  . ERECTILE DYSFUNCTION, ORGANIC 04/12/2010  . GERD 08/23/2009  . METHICILLIN RESISTANT STAPHYLOCOCCUS AUREUS INFECTION 08/05/2009  . ENLARGEMENT OF LYMPH NODES 07/20/2009  . Embolism and thrombosis  of artery ([place]) 07/19/2009  . HYPOGONADISM 07/15/2009  . DEFICIENCY OF OTHER VITAMINS 07/15/2009  . PULMONARY NODULE 07/15/2009  . Status post above knee amputation ([place]) 07/15/2009  . Human immunodeficiency virus (HIV) disease ([place]) 11/10/2006  . SHINGLES, RECURRENT 11/10/2006  . HEPATITIS B 11/10/2006  . DYSLIPIDEMIA 11/10/2006  . PSYCHOSIS 11/10/2006  . Alcohol abuse 11/10/2006  . CIGARETTE SMOKER 11/10/2006  . PERIPHERAL NEUROPATHY 11/10/2006  . ALLERGIC RHINITIS 11/10/2006  . DENTAL CARIES 11/10/2006    Expected Discharge Date: Expected Discharge Date: 10/01/15  Team Members Present: Physician leading conference: Dr. Alger Simons Social Worker Present: Lennart Pall, LCSW Nurse Present: Dorien Chihuahua, RN PT Present: Canary Brim, PT OT Present: Napoleon Form, OT PPS Coordinator present : Daiva Nakayama, RN, CRRN     Current Status/Progress Goal Weekly Team Focus  Medical   debilitated, hiv, righ aka, sundowing, chronic pain syndrome  improve activity tolerance, balance  cognition, sleep patterns   Bowel/Bladder   Continent of bowel and bladder. LBM 09/26/15  Remain continent; Mod I  Know s/s of constipation associated with pain medication   Swallow/Nutrition/ Hydration             ADL's   Min A standing ADL and functional transfers;mod cuing during functional tasks due to cognition  Supervision overall for cuing and safety awareness  Activity tolerance, standing balance, ADL re-training, functional transfers, d/c planning, safety awareness   Mobility   supervision to min assist overall; cognition and low endurance limiting  supervision overall (goals downgraded for safety and cues)  safety,  endurance, functional strengthening, transfers, gait, stairs, d/c planning   Communication             Safety/Cognition/ Behavioral Observations            Pain   60mg  MS Contin q 12hr, Oxy IR 5mg  q 6hr prn  <4 on a 0-10 pain scale  assess pain q 4 hr and prn   Skin   Unstagable  ulcer to R buttock, applying Santyl, wet/dry dressing, dry gauze  remain free from additional breakdown while on rehab  assess skin q shift and prn    Rehab Goals Patient on target to meet rehab goals: No Rehab Goals Revised: decreased cognition past couple of days -  *See Care Plan and progress notes for long and short-term goals.  Barriers to Discharge: ?baseline cognitive deficits, current confusion    Possible Resolutions to Barriers:  adjusting regimen, modify dc goals potentially to account for safety    Discharge Planning/Teaching Needs:  Plan is for pt to d/c home with daughter, [name], who confirms that she is able to provide 24/7 assistance.  Teaching to be scheduled prior to dc.   Team Discussion:  Slight decline in overall function past couple of days?  Poor sleep and may be related to pain meds.  UA neg.  Does seem better in morning sessions.  Currently at supervision/ min with txs and goals set for supervision.  Must have oversight for med management at home.  Revisions to Treatment Plan:  NA   Continued Need for Acute Rehabilitation Level of Care: The patient requires daily medical management by a physician with specialized training in physical medicine and rehabilitation for the following conditions: Daily direction of a multidisciplinary physical rehabilitation program to ensure safe treatment while eliciting the highest outcome that is of practical value to the patient.: Yes Daily medical management of patient stability for increased activity during participation in an intensive rehabilitation regime.: Yes Daily analysis of laboratory values and/or radiology reports with any subsequent need for medication adjustment of medical intervention for : Neurological problems;Other  Jery Hollern 09/29/2015, 8:23 AM

## 2015-09-29 NOTE — Progress Notes (Signed)
Social Work Patient ID: [name], male   DOB: [birth year], 54 y.o.   MRN: [id number]   Lowella Curb, LCSW Social Worker Signed  Patient Care Conference 09/29/2015  8:23 AM    Expand All Collapse All   Inpatient RehabilitationTeam Conference and Plan of Care Update Date: 09/28/2015   Time: 2:05 PM     Patient Name: [name]       Medical Record Number: [id number]  Date of Birth: February 26, 1961 Sex: Male         Room/Bed: 4M03C/4M03C-01 Payor Info: Payor: MEDICARE / Plan: MEDICARE PART A AND B / Product Type: *No Product type* /    Admitting Diagnosis: Debility   Admit Date/Time:  09/22/2015  3:53 PM Admission Comments: No comment available   Primary Diagnosis:  <principal problem not specified> Principal Problem: <principal problem not specified>    Patient Active Problem List     Diagnosis  Date Noted   .  Debilitated  09/22/2015   .  Renal tubular acidosis     .  AIDS ([place])     .  AKI (acute kidney injury) ([place])     .  Acute blood loss anemia     .  History of above knee amputation ([place])     .  Hyponatremia     .  Hypophosphatemia     .  SBO (small bowel obstruction) ([place])     .  Hyperosmolality and/or hypernatremia     .  Hypomagnesemia     .  Pressure ulcer  09/13/2015   .  Protein-calorie malnutrition, severe  09/13/2015   .  Abdominal pain     .  Pneumonia due to Klebsiella pneumoniae ([place])     .  Septic shock ([place])  09/10/2015   .  Gram-negative bacteremia ([place])  09/10/2015   .  Polyuria  08/06/2015   .  Prediabetes  08/06/2015   .  Hematuria  08/06/2015   .  UTI (urinary tract infection)  05/14/2015   .  Avascular necrosis of femoral head ([place])  05/14/2015   .  Left adrenal mass ([place])  05/14/2015   .  Hypokalemia  10/08/2014   .  CKD (chronic kidney disease)  02/10/2014   .  Hyperglycemia  12/29/2013   .  Hyperbilirubinemia  12/29/2013   .  Unintentional weight loss  11/04/2013   .  Left hip pain  03/09/2013   .  Preventative health care  03/09/2013   .   Phantom limb syndrome with pain ([place])  03/21/2012   .  Long term current use of anticoagulant  11/20/2010   .  ERECTILE DYSFUNCTION, ORGANIC  04/12/2010   .  GERD  08/23/2009   .  METHICILLIN RESISTANT STAPHYLOCOCCUS AUREUS INFECTION  08/05/2009   .  ENLARGEMENT OF LYMPH NODES  07/20/2009   .  Embolism and thrombosis of artery ([place])  07/19/2009   .  HYPOGONADISM  07/15/2009   .  DEFICIENCY OF OTHER VITAMINS  07/15/2009   .  PULMONARY NODULE  07/15/2009   .  Status post above knee amputation ([place])  07/15/2009   .  Human immunodeficiency virus (HIV) disease ([place])  11/10/2006   .  SHINGLES, RECURRENT  11/10/2006   .  HEPATITIS B  11/10/2006   .  DYSLIPIDEMIA  11/10/2006   .  PSYCHOSIS  11/10/2006   .  Alcohol abuse  11/10/2006   .  CIGARETTE SMOKER  11/10/2006   .  PERIPHERAL NEUROPATHY  11/10/2006   .  ALLERGIC RHINITIS  11/10/2006   .  DENTAL CARIES  11/10/2006     Expected Discharge Date: Expected Discharge Date: 10/01/15  Team Members Present: Physician leading conference: Dr. Alger Simons Social Worker Present: Lennart Pall, LCSW Nurse Present: Dorien Chihuahua, RN PT Present: Canary Brim, PT OT Present: Napoleon Form, OT PPS Coordinator present : Daiva Nakayama, RN, CRRN        Current Status/Progress  Goal  Weekly Team Focus   Medical     debilitated, hiv, righ aka, sundowing, chronic pain syndrome   improve activity tolerance, balance   cognition, sleep patterns   Bowel/Bladder     Continent of bowel and bladder. LBM 09/26/15   Remain continent; Mod I  Know s/s of constipation associated with pain medication    Swallow/Nutrition/ Hydration               ADL's     Min A standing ADL and functional transfers;mod cuing during functional tasks due to cognition  Supervision overall for cuing and safety awareness   Activity tolerance, standing balance, ADL re-training, functional transfers, d/c planning, safety awareness   Mobility     supervision to min assist overall; cognition  and low endurance limiting  supervision overall (goals downgraded for safety and cues)   safety, endurance, functional strengthening, transfers, gait, stairs, d/c planning   Communication               Safety/Cognition/ Behavioral Observations              Pain     60mg  MS Contin q 12hr, Oxy IR 5mg  q 6hr prn  <4 on a 0-10 pain scale  assess pain q 4 hr and prn   Skin     Unstagable ulcer to R buttock, applying Santyl, wet/dry dressing, dry gauze  remain free from additional breakdown while on rehab   assess skin q shift and prn    Rehab Goals Patient on target to meet rehab goals: No Rehab Goals Revised: decreased cognition past couple of days -  *See Care Plan and progress notes for long and short-term goals.    Barriers to Discharge:  ?baseline cognitive deficits, current confusion      Possible Resolutions to Barriers:   adjusting regimen, modify dc goals potentially to account for safety     Discharge Planning/Teaching Needs:   Plan is for pt to d/c home with daughter, [name], who confirms that she is able to provide 24/7 assistance.  Teaching to be scheduled prior to dc.    Team Discussion:    Slight decline in overall function past couple of days?  Poor sleep and may be related to pain meds.  UA neg.  Does seem better in morning sessions.  Currently at supervision/ min with txs and goals set for supervision.  Must have oversight for med management at home.   Revisions to Treatment Plan:    NA    Continued Need for Acute Rehabilitation Level of Care: The patient requires daily medical management by a physician with specialized training in physical medicine and rehabilitation for the following conditions: Daily direction of a multidisciplinary physical rehabilitation program to ensure safe treatment while eliciting the highest outcome that is of practical value to the patient.: Yes Daily medical management of patient stability for increased activity during participation in an  intensive rehabilitation regime.: Yes Daily analysis of laboratory values and/or radiology reports with any subsequent need for medication adjustment of medical  intervention for : Neurological problems;Other  Reagann Dolce 09/29/2015, 8:23 AM

## 2015-09-29 NOTE — Progress Notes (Signed)
Alerted by staff this morning that pt is refusing to remain on CIR and wants to d/c home today.  MD has cleared him for d/c and I have contacted daughter who is coming to pick him up.  Pt very agitated and not wanting to speak with me, however, I have made community referrals.    Social Work  Discharge Note  The overall goal for the admission was met for:   Discharge location: Yes - home with daughter who can provide 24/7 assistance  Length of Stay: No - pt refusing to complete CIR stay and d/c home today with LOS=7 days  Discharge activity level: No - had supervision goals, however, d/c'ing at min/ supervison level  Home/community participation: Yes  Services provided included: MD, RD, PT, OT, SLP, RN, TR, Pharmacy, [place]: Medicare and Medicaid  Follow-up services arranged: Home Health: RN, PT via [place] and Patient/Family has no preference for HH/DME agencies  Comments (or additional information): placed a referral with [facility] for community case management   Patient/Family verbalized understanding of follow-up arrangements: Yes  Individual responsible for coordination of the follow-up plan: pt/ daughter  Confirmed correct DME delivered: NA - had all needed DME    Loralie Malta

## 2015-09-29 NOTE — Progress Notes (Signed)
Occupational Therapy Note  Patient Details  Name: [name] MRN: [id number] Date of Birth: 10-Oct-1961  Today's Date: 09/29/2015 OT Missed Time: 65 Minutes Missed Time Reason: Patient unwilling/refused to participate without medical reason  Pt up in w/c upon arrival with RN present. RN reporting pt did not sleep last night and has been refusing all medications.  Attempted therapy session with pt, however, pt very agitated and stating he was done with rehab and leaving to go home. He refused all tx ideas and requested therapist to leave. Followed up with RN and PA regarding pt's behavior.    Lewis, Julian Askin C 09/29/2015, 9:13 AM

## 2015-09-29 NOTE — Progress Notes (Signed)
Occupational Therapy Discharge Summary  Patient Details  Name: Quintin Hjort MRN: [id number] Date of Birth: 07-08-1961   Patient has met 7 of 9 long term goals due to improved activity tolerance, improved balance, postural control and improved coordination.  Patient to discharge at [facility] Assist level.  Patient's care partner is independent to provide the necessary physical assistance at discharge.    Reasons goals not met: Patient refusing to complete CIR stay and self discharged before targeted d/c date.   Recommendation:  Patient with no further OT needs.   Equipment: Pt already has all needed equipment  Reasons for discharge: discharge from hospital  Patient/family agrees with progress made and goals achieved: Yes   See Function Navigator for Current Functional Status.  Lewis, Tobi Leinweber C 09/29/2015, 3:37 PM

## 2015-09-29 NOTE — Progress Notes (Addendum)
[facility] PHYSICAL MEDICINE & REHABILITATION     PROGRESS NOTE    Subjective/Complaints: Up in room. "i'm going home. i'm tired of this #$^7". Pt irritable most of the night per RN  ROS limited by mood/cognition  Objective: Vital Signs: Blood pressure 128/64, pulse 112, temperature 98.4 F (36.9 C), temperature source Oral, resp. rate 16, height 6' (1.829 m), weight 43.727 kg (96 lb 6.4 oz), SpO2 100 %. No results found.  Recent Labs  09/26/15 0950  WBC 10.5  HGB 9.9*  HCT 29.5*  PLT 403*    Recent Labs  09/28/15 0527  NA 141  K 3.5  CL 116*  GLUCOSE 114*  BUN 20  CREATININE 1.11  CALCIUM 9.1   CBG (last 3)  No results for input(s): GLUCAP in the last 72 hours.  Wt Readings from Last 3 Encounters:  09/29/15 43.727 kg (96 lb 6.4 oz)  09/22/15 41.776 kg (92 lb 1.6 oz)  08/06/15 53.615 kg (118 lb 3.2 oz)    Physical Exam:  Constitutional: No distress.  Cachectic   HENT:  Head: Normocephalic and atraumatic.  Eyes: Conjunctivae and EOM are normal.  Neck: Normal range of motion. No thyromegaly present.  Cardiovascular: Normal rate and regular rhythm.  Respiratory: Effort normal and breath sounds normal.  GI: Soft. Bowel sounds are normal. He exhibits no distension.  Musculoskeletal: He exhibits no edema or tenderness.  Right AKA  Neurological: He is alert and oriented to person and place.  Sensation intact to light touch throughout Motor: B/l UE 4/5 proximal to distal LLE: 4+/5 proximal to distal Right hip flexion 4/5.  Skin: Skin is warm and dry.  AKA site is healed. Sacral ulcer dressed Psychiatric: disoriented, confused. Does follow simple commands. Trying to put right AK stump in urinal.  Assessment/Plan: 1. Weakness/ debility related to multiple medical issues/HIV which require 3+ hours per day of interdisciplinary therapy in a comprehensive inpatient rehab setting. Physiatrist is providing close team supervision and 24 hour management  of active medical problems listed below. Physiatrist and rehab team continue to assess barriers to discharge/monitor patient progress toward functional and medical goals.  Function:  Bathing Bathing position   Position: Shower (sitting on tub transfer bench)  Bathing parts Body parts bathed by patient: Right arm, Left arm, Chest, Abdomen, Right upper leg, Left upper leg, Left lower leg Body parts bathed by helper: Buttocks, Back  Bathing assist Assist Level: Touching or steadying assistance(Pt > 75%), Set up (patient completed 7/9 parts, 78%, FIM level 4, min assist)   Set up : To obtain items  Upper Body Dressing/Undressing Upper body dressing   What is the patient wearing?: Pull over shirt/dress     Pull over shirt/dress - Perfomed by patient: Thread/unthread right sleeve, Thread/unthread left sleeve, Put head through opening, Pull shirt over trunk Pull over shirt/dress - Perfomed by helper: Pull shirt over trunk        Upper body assist Assist Level: Set up (3/4 steps, 75%, min assist)   Set up : To obtain clothing/put away  Lower Body Dressing/Undressing Lower body dressing   What is the patient wearing?: Shoes Underwear - Performed by patient: Thread/unthread right underwear leg, Thread/unthread left underwear leg Underwear - Performed by helper: Pull underwear up/down Pants- Performed by patient: Thread/unthread right pants leg, Thread/unthread left pants leg, Fasten/unfasten pants Pants- Performed by helper: Pull pants up/down Non-skid slipper socks- Performed by patient: Don/doff left sock   Socks - Performed by patient: Don/doff left sock  Shoes - Performed by patient: Don/doff left shoe, Fasten left Shoes - Performed by helper: Don/doff left shoe, Fasten left       TED Hose - Performed by helper: Don/doff left TED hose  Lower body assist Assist for lower body dressing:  (Mod assist)      Toileting Toileting   Toileting steps completed by patient: Adjust  clothing prior to toileting, Performs perineal hygiene, Adjust clothing after toileting Toileting steps completed by helper: Adjust clothing prior to toileting, Adjust clothing after toileting, Performs perineal hygiene Toileting Assistive Devices: Grab bar or rail  Toileting assist Assist level: Supervision or verbal cues   Transfers Chair/bed transfer   Chair/bed transfer method: Stand pivot Chair/bed transfer assist level: Supervision or verbal cues Chair/bed transfer assistive device: Armrests, Medical sales representative     Max distance: 60 Assist level: Touching or steadying assistance (Pt > 75%)   Wheelchair   Type: Motorized Max wheelchair distance: 150 Assist Level: Supervision or verbal cues  Cognition Comprehension Comprehension assist level: Understands basic 75 - 89% of the time/ requires cueing 10 - 24% of the time  Expression Expression assist level: Expresses complex 90% of the time/cues < 10% of the time  Social Interaction Social Interaction assist level: Interacts appropriately 75 - 89% of the time - Needs redirection for appropriate language or to initiate interaction.  Problem Solving Problem solving assist level: Solves basic 50 - 74% of the time/requires cueing 25 - 49% of the time  Memory Memory assist level: Recognizes or recalls 75 - 89% of the time/requires cueing 10 - 24% of the time   Medical Problem List and Plan: 1. Debilitation/weakness related to Klebsiella pneumonia bacteremia HIV/right AKA/small bowel obstruction/upper GI bleed   -contact daughter. Dc home today with [place] follow up with PT, OT, RN 2. DVT Prophylaxis/Anticoagulation/history DVT: resumed warfarin. Appreciate pharmacy's assistance in further titration.   Can continue low dose lovenox until inr therapeutic 3. Pain Management: Oxycodone as needed.   -resumed ms contin 60mg  q12----reduce to 30mg . (was taking 120mg  at home per Dr. Johnnye Sima)  4. Acute on chronic anemia. Follow-up CBC   --hgb 10 . Chronic Coumadin resumed.  5. Neuropsych: This patient iscapable of making decisions on his own behalf.  -continued issues with sleep, tends to sundown. Worse last night into today.  -recent lab work including today reviewed and negative  -will check urine specimen  -reduced ms contin to 30mg --he'll need to follow up with primary regarding further narcotic rx's  -will not send home on risperdal  -certainly could be HIV component to presentation 6. Skin/Wound Care/stage II pressure ulcer: Routine skin checks . Foam dressing as recommended by wound care nurse  -improve nutrition  -air mattress overlay for bed  -continued patient education 7. Fluids/Electrolytes/Nutrition: encourage po  -dc megace    -I personally reviewed the patient's labs today.   -removed central line 8. Acute on chronic kidney disease/hyponatremia/hypokalemia. Likely secondary to ATN. Follow-up renal services 9. HIV. CD4 decreased from 700 to 219. Antivirals have been resumed.  -wbc's  10.5k   10. Insomnia: increased seroquel to 50mg  qhs with some benefit  .  LOS (Days) 7 A FACE TO FACE EVALUATION WAS PERFORMED  Betheny Suchecki T 09/29/2015 8:03 AM

## 2015-09-29 NOTE — Plan of Care (Signed)
Problem: RH Bathing Goal: LTG Patient will bathe with assist, cues/equipment (OT) LTG: Patient will bathe specified number of body parts with assist with/without cues using equipment (position) (OT)  Outcome: Not Met (add Reason) Pt refused bathing task each day on rehab, bathing assessed on day of eval only.   Problem: RH Tub/Shower Transfers Goal: LTG Patient will perform tub/shower transfers w/assist (OT) LTG: Patient will perform tub/shower transfers with assist, with/without cues using equipment (OT)  Outcome: Not Met (add Reason) Pt refused bathing task each day on rehab, bathing assessed on day of eval only. Simulated shower transfer completed with supervision.

## 2015-09-30 NOTE — Discharge Summary (Signed)
NAMETALAN, GRUSS NO.:  [id number]  MEDICAL RECORD NO.:  [id number]  LOCATION:  4M03C                        FACILITY:  [facility]  PHYSICIAN:  Meredith Staggers, M.D.DATE OF BIRTH:  1961/07/28  DATE OF ADMISSION:  09/20/2015 DATE OF DISCHARGE:  09/29/2015                              DISCHARGE SUMMARY   DISCHARGE DIAGNOSES: 1. Debilitation, weakness related to Klebsiella pneumonia, bacteremia,     human immunodeficiency virus  with history of right above knee     amputation, small bowel obstruction, upper GI bleed. 2. Coumadin therapy. 3. Pain management. 4. Acute on chronic anemia. 5. Sacral ulcer. 6. Acute on chronic kidney disease. 7. Human immunodeficiency virus.  HISTORY OF PRESENT ILLNESS:  This is a 54 year old right-handed male with history of HIV, right AKA due to DVT, maintained on Coumadin, lives with his 53 year old daughter, independent prior to admission with prosthesis.  One-level apartment with a ramp to entrance.  The patient transferred from [facility] 10, 2015, with dark tarry stools and hematemesis.  INR greater than 10 and reversed. Found to be acidotic and hypernatremic and hypokalemic, required intubation.  Chest x-ray with bilateral pneumonia, question aspiration, placed on broad-spectrum antibiotics.  The patient was transfused. Blood cultures positive for Klebsiella.  KUB of the abdomen showed partial small-bowel obstruction requiring nasogastric tube.  Renal Service followup for hypernatremia as well as hypokalemia, acute renal insufficiency.  Supplement added, latest sodium improved as well as potassium.  Subcutaneous Lovenox added for DVT prophylaxis and initially Eliquis initiated for history of DVT and later Coumadin resumed.  Wound care followup for sacral wound with dressing changes as advised. Physical and occupational therapy ongoing.  The patient was admitted for comprehensive rehab  program.  PAST MEDICAL HISTORY:  See discharge diagnoses.  SOCIAL HISTORY:  Lives with daughter at 1-level home with ramp entrance. Independent with assistive device prior to admission.  Functional status upon admission to rehab services:  Minimal assist with rolling walker, 100 feet; minutes to mod assist for stand pivot and squat pivot transfers; min to mod assist for activities of daily living.  PHYSICAL EXAMINATION:  VITAL SIGNS:  Blood pressure 108/58, pulse 96, temperature 98, respirations 18. GENERAL:  This was an alert, African-American male, appearing older than stated age. LUNGS:  Clear auscultation without wheeze. CARDIAC:  Regular rate and rhythm without murmur. ABDOMEN:  Soft, nontender.  Good bowel sounds. EXTREMITIES:  Right AKA well healed.  Sacral ulcer with dressing in place.  No odor.  Bilateral upper extremities 4/5 proximal to the distal.  Left lower extremity 4+/5 proximal to distal.  Right hip flexion 4/5.  [place] COURSE:  The patient was admitted to inpatient rehab services with therapies initiated on a 3-hour daily basis consisting of physical therapy, occupational therapy, and rehabilitation nursing.  The following issues were addressed during the patient's rehabilitation stay.  Pertaining to Mr. Alexis debilitation related to Klebsiella pneumonia, HIV, his antivirals had been resumed. He would follow up with family medicine.  Hemoglobin and hematocrit remained stable.  Renal function had greatly improved.  Close monitoring of electrolytes.  He would follow up with Renal Services as needed.  Acute on chronic anemia, hemoglobin of 10.  Monitored closely with chronic Coumadin resumed, no bleeding episodes.  He would follow up with Dr. Lovena Le at the outpatient clinic.  A home health nurse had been arranged for blood draws.  Chronic pain management.  His MS Contin was reduced at 30 mg every 12 hours.  He is also using oxycodone  for breakthrough pain.  Therapies have been initiated.  The patient needing ongoing encouragement to participate, refusing all therapies.  He had refused his Lovenox injections while awaiting Coumadin to be therapeutic.  He was combative and restless with staff at times. Security had to be contacted due to the patient's irritability. Demanding discharge to home.  His daughter was contacted in regard to discharge plan.  Latest therapy notes basic transfers squat pivot, stand pivot, rolling walker, short distances ambulation in simulated home environment, simulated car transfers to a van height needing minimal cues.  His daughter had been in for the necessary training.  Plan was discharged to home with ongoing therapies.  DISCHARGE MEDICATIONS: 1. [name] 800-150 mg tablet daily. 2. [name] 150 mg p.o. daily. 3. Magnesium chloride 64 mg daily. 4. MS Contin 30 mg every 12 hours, dispense of 30 tablets. 5. Multivitamin daily. 6. Oxycodone immediate release 5 mg every 6 hours as needed for severe     pain, dispense of 30 tablets. 7. Protonix 40 mg p.o. daily. 8. Phosphorus 750 mg p.o. b.i.d. 9. Potassium chloride 20 mEq p.o. b.i.d. 10.[name] 300 mg p.o. daily. 11.Coumadin 5 mg p.o. daily.  DIET:  Mechanical soft.  The patient would have a home health nurse. Arranged for blood draws on October 01, 2015, results to Dr. Viviano Simas, [phone], home health nurse arranged for santol sacral ulcer daily and monitor wound.  The patient would follow up Dr. Alger Simons at the outpatient rehab service office as needed; Dr. Bobby Rumpf, Infectious Disease.     Lauraine Rinne, P.A.   ______________________________ Meredith Staggers, M.D.    DA/MEDQ  D:  09/29/2015  T:  09/30/2015  Job:  [id number]  cc:   Louis Meckel, M.D. Viviano Simas, Dr.

## 2015-09-30 NOTE — Progress Notes (Signed)
Physical Therapy Discharge Summary  Patient Details  Name: [name] MRN: [id number] Date of Birth: [birth year]  Patient has met 8 of 12 long term goals due to improved activity tolerance, improved balance, increased strength and ability to compensate for deficits.  Patient to discharge at supervision w/c level with short distance household ambulation at supervision to min assist. Pt's daughter completed some family education but since pt did not complete program, further training did not occur.  Reasons goals not met: Pt did not complete full CIR program and self-discharged before targeted d/c date.  Recommendation:  Patient will benefit from ongoing skilled PT services in home health setting to continue to advance safe functional mobility, address ongoing impairments in strength, balance, prosthetic fit, endurance, functional mobility, and minimize fall risk.  Equipment: No equipment provided. Pt already has power w/c and RW.  Reasons for discharge: refusal of 3 consecutive treatment sessions without medical reason and discharge from hospital  Patient/family agrees with progress made and goals achieved: Yes  See Function Navigator for Current Functional Status.  [name], PT, DPT  09/30/2015, 4:06 PM

## 2015-10-01 ENCOUNTER — Other Ambulatory Visit: Payer: Self-pay | Admitting: *Deleted

## 2015-10-01 ENCOUNTER — Telehealth: Payer: Self-pay | Admitting: Internal Medicine

## 2015-10-01 NOTE — Telephone Encounter (Addendum)
Anderson Malta calls with PT/ INR results done today= 1.6 and 19.0 Pt is taking 5 mg warfarin daily You may call her with questions, new orders [phone]

## 2015-10-01 NOTE — Telephone Encounter (Addendum)
Anticoagulation Management [name] is a 54 y.o. male who was contacted for monitoring of warfarin treatment.    Indication: DVT Duration: indefinite  ASSESSMENT Recent Results: Recent results are below, the most recent result is correlated with a dose of 35 mg per week: Lab Results  Component Value Date   INR 1.6 10/01/2015   INR 1.27 09/29/2015   INR 1.21 09/28/2015   INR 1.31 09/27/2015   PROTIME 38.4* 04/01/2012   INR today: Therapeutic  Anticoagulation Dosing: INR as of 09/29/2015 and Previous Dosing Information    INR Dt INR Goal [employer] Sun Mon Tue Wed Thu Fri Sat   09/29/15 1.27 2.0-3.0 35 mg 5 mg 5 mg 5 mg 5 mg 5 mg 5 mg 5 mg    Anticoagulation Dose Instructions as of 08/23/2015      Total Sun Mon Tue Wed Thu Fri Sat   New Dose 40 mg 5 mg 7.5 mg 5 mg 5 mg 7.5 mg 5 mg 5 mg     (5 mg x 1)  (5 mg x 1.5)  (5 mg x 1)  (5 mg x 1)  (5 mg x 1.5)  (5 mg x 1)  (5 mg x 1)     PLAN Weekly dose was increased by 15% to 40 mg per week  Patient was contacted under my supervision by Darvin Neighbours, PharmD Candidate for medication counseling regarding warfarin.  Follow-up 1 week, Anderson Malta from [facility] to call clinic with INR results  Kim,Jennifer J

## 2015-10-01 NOTE — Telephone Encounter (Signed)
Forwarding

## 2015-10-01 NOTE — Patient Outreach (Signed)
[place] [facility]) Care Management  10/01/2015  [name] [birth year] [id number]  Initial Transition of care  RN attempted to reach pt today however no answer to the contact available. Will continue outreach attempts for possible [facility] services.   Raina Mina, RN Care Management Coordinator [facility] Main Office [phone]

## 2015-10-01 NOTE — Telephone Encounter (Signed)
[name] from [facility] requesting the nurse to call back.

## 2015-10-01 NOTE — Telephone Encounter (Signed)
Contacted patient, daughter, and [facility] nurse to address INR and adjust warfarin dosing.  Thank you!

## 2015-10-01 NOTE — Telephone Encounter (Signed)
Dianne [facility] also calls for verbal approval for PCS aide,OT,csw, verbal given, do you approve, dr Lovena Le

## 2015-10-04 ENCOUNTER — Other Ambulatory Visit: Payer: Medicare Other

## 2015-10-05 ENCOUNTER — Other Ambulatory Visit: Payer: Self-pay | Admitting: *Deleted

## 2015-10-05 ENCOUNTER — Telehealth: Payer: Self-pay | Admitting: *Deleted

## 2015-10-05 DIAGNOSIS — E46 Unspecified protein-calorie malnutrition: Secondary | ICD-10-CM

## 2015-10-05 NOTE — Telephone Encounter (Signed)
[facility] calls and request that pt be seen for medication assess of pain meds, she states pt insist on taking pain med morphine 30mg , 4 tablets twice daily instead of cutting down the doses, his daughter is having a hard time trying to get pt to abide by lower dose as instructed at hosp disch. Also pt has an an ulcer on R glut that cannot be staged, it is 3.7cm x 4.2cm x 0.1cm, 75% yellow tissue, they are using santyl on the ulcer at this time, HHN would like md to evaluate for possible wound center referral. Pt will also need pt/ INR at this time and redosing of coumadin i am sending this note to dr taylor, dr Melburn Hake and dr [name]

## 2015-10-05 NOTE — Patient Outreach (Addendum)
[place] [facility]) Care Management  10/05/2015  [name] 09/23/1961 [id number]   Transition of care (week 2)  RN spoke with pt and his daughter today and inquired on pt's ongoing recovery after introducing the [facility] program and purpose of today's call.  Several topics of discussion as pt reported his nutritional issues is he needs more protein and currently receives ENSURE from a local shelter but they are on a low supply and he is limited on what he can obtain. Interested in other shelters if available to assistance (will refer via CSW). Pt has indicated he did receive the few meals via [place] and maybe be interested in ongoing meals from [facility] but interested in the cost.  Pt has a daughter [name] currently as his primary caregiver and states [facility] is currently involved with ongoing dressing changes in a sacral ulcer wound on the right side with visits on Tuesday and Thursday each week. Daughter states she has been taught how to change the dressing and following the instruction accordingly. RN stress the importance of a clean technique when changing the dressing to avoid possible infection (both verbalized an understanding).  PT was involved states pt however it has been reported that the pt needs a prothesis(history of right BKA) prior to starting any PT in the home. RN attempted to completed the initial assessment gathering as much information as possible noting pt does not have a Living Will in place. RN offered to send information and spoke with [name] (pt's daughter) on what to do to complete forms and obtain a notary  from her local bank. RN has explained the purpose of today's call is to avoid possible risk of readmission into the hospital if occurring issue can be prevented with early interventions. RN offered to assist further with a home visit however pt not sure if this is necessary based upon the involved care with [facility]. RN explained the difference between [facility] and case  management services with [facility] and clarified there is no conflict of interest. Pt receptive to ongoing telephonic and RN requested to follow up on Monday and will once again extend the community home visits to further address pt's needs and follow up on refer information for local shelters, meal services through [facility] and A.D packet. All goals and plan of care discussed to pt's expectation to attend his medical appointments, take his medications as prescribed and avoid possible hospitalization based upon his condition at this time with Right BKA with no falls or injuries. No other requested or inquires at this time as RN will call pt on Monday for ongoing transition of care.  Raina Mina, RN Care Management Coordinator [facility] Main Office [phone]

## 2015-10-06 DIAGNOSIS — S0083XA Contusion of other part of head, initial encounter: Secondary | ICD-10-CM | POA: Diagnosis not present

## 2015-10-06 DIAGNOSIS — Z888 Allergy status to other drugs, medicaments and biological substances status: Secondary | ICD-10-CM | POA: Diagnosis not present

## 2015-10-06 DIAGNOSIS — R220 Localized swelling, mass and lump, head: Secondary | ICD-10-CM | POA: Diagnosis not present

## 2015-10-06 DIAGNOSIS — F17210 Nicotine dependence, cigarettes, uncomplicated: Secondary | ICD-10-CM | POA: Diagnosis not present

## 2015-10-06 DIAGNOSIS — Z881 Allergy status to other antibiotic agents status: Secondary | ICD-10-CM | POA: Diagnosis not present

## 2015-10-06 DIAGNOSIS — S0990XA Unspecified injury of head, initial encounter: Secondary | ICD-10-CM | POA: Diagnosis not present

## 2015-10-06 DIAGNOSIS — Z882 Allergy status to sulfonamides status: Secondary | ICD-10-CM | POA: Diagnosis not present

## 2015-10-06 DIAGNOSIS — Z21 Asymptomatic human immunodeficiency virus [HIV] infection status: Secondary | ICD-10-CM | POA: Diagnosis not present

## 2015-10-07 ENCOUNTER — Telehealth: Payer: Self-pay | Admitting: *Deleted

## 2015-10-07 ENCOUNTER — Ambulatory Visit: Payer: Medicare Other | Admitting: Internal Medicine

## 2015-10-07 DIAGNOSIS — Z7901 Long term (current) use of anticoagulants: Secondary | ICD-10-CM | POA: Diagnosis not present

## 2015-10-07 DIAGNOSIS — N189 Chronic kidney disease, unspecified: Secondary | ICD-10-CM | POA: Diagnosis not present

## 2015-10-07 DIAGNOSIS — Z5181 Encounter for therapeutic drug level monitoring: Secondary | ICD-10-CM | POA: Diagnosis not present

## 2015-10-07 DIAGNOSIS — B20 Human immunodeficiency virus [HIV] disease: Secondary | ICD-10-CM | POA: Diagnosis not present

## 2015-10-07 NOTE — Telephone Encounter (Signed)
Anticoagulation Management [name] is a 54 y.o. male, contacted [name] with [name] [phone] for warfarin monitoring  Indication: DVT Duration: indefinite  ASSESSMENT Recent Results: Recent results are below, the most recent result is correlated with a dose of 40 mg per week: Lab Results  Component Value Date   INR 4.39 10/07/2015   INR 1.6 10/01/2015   INR 1.27 09/29/2015   INR 1.21 09/28/2015   INR 1.31 09/27/2015   PROTIME 38.4* 04/01/2012   INR today: Supratherapeutic; patient and daughter [name]) report no signs/symptoms of bleeding or thromboembolism. Of note, [name] states that patient fell yesterday and was taken to the ED but CT scan was negative and patient is doing well overall.  Anticoagulation Dosing: INR as of 10/01/2015 and Previous Dosing Information    INR Dt INR Goal [facility] Sun Mon Tue Wed Thu Fri Sat   10/01/2015 1.6 2.0-3.0 40 mg 5 mg 7.5 mg 5 mg 5 mg 7.5 mg 5 mg 5 mg    Anticoagulation Dose Instructions as of 08/23/2015      Total Sun Mon Tue Wed Thu Fri Sat   New Dose 30 mg 5 mg 5 mg 5 mg 5 mg 0 mg 5 mg 5 mg     (5 mg x 1)  (5 mg x 1)  (5 mg x 1)  (5 mg x 1)  (Hold)  (5 mg x 1)  (5 mg x 1)                           PLAN Weekly dose was decreased by 25% to 30 mg per week. Follow up in 1 week for close monitoring due to recent fall, history of bleeding, and alcohol abuse.  [name],[name]

## 2015-10-07 NOTE — Telephone Encounter (Signed)
Anderson Malta with [facility] [phone] called with INR this AM on pt. Results 7.3 and 88.1 - was done on machine.  A venipuncture was done and will fax results. Results sent  to Dr [name]. Hilda Blades Tuesday Terlecki RN 10/07/15 9:45AM

## 2015-10-08 ENCOUNTER — Telehealth: Payer: Self-pay | Admitting: *Deleted

## 2015-10-08 ENCOUNTER — Encounter: Payer: Self-pay | Admitting: Student-PharmD

## 2015-10-08 ENCOUNTER — Ambulatory Visit (INDEPENDENT_AMBULATORY_CARE_PROVIDER_SITE_OTHER): Payer: Medicare Other | Admitting: Internal Medicine

## 2015-10-08 ENCOUNTER — Encounter: Payer: Self-pay | Admitting: Internal Medicine

## 2015-10-08 VITALS — BP 145/70 | HR 118 | Temp 97.8°F

## 2015-10-08 DIAGNOSIS — Z21 Asymptomatic human immunodeficiency virus [HIV] infection status: Secondary | ICD-10-CM | POA: Diagnosis not present

## 2015-10-08 DIAGNOSIS — Z7901 Long term (current) use of anticoagulants: Secondary | ICD-10-CM

## 2015-10-08 DIAGNOSIS — B20 Human immunodeficiency virus [HIV] disease: Secondary | ICD-10-CM

## 2015-10-08 DIAGNOSIS — I749 Embolism and thrombosis of unspecified artery: Secondary | ICD-10-CM

## 2015-10-08 DIAGNOSIS — K219 Gastro-esophageal reflux disease without esophagitis: Secondary | ICD-10-CM | POA: Diagnosis present

## 2015-10-08 DIAGNOSIS — Z89611 Acquired absence of right leg above knee: Secondary | ICD-10-CM

## 2015-10-08 DIAGNOSIS — Z9181 History of falling: Secondary | ICD-10-CM | POA: Diagnosis not present

## 2015-10-08 DIAGNOSIS — E568 Deficiency of other vitamins: Secondary | ICD-10-CM

## 2015-10-08 DIAGNOSIS — F17210 Nicotine dependence, cigarettes, uncomplicated: Secondary | ICD-10-CM

## 2015-10-08 DIAGNOSIS — M87059 Idiopathic aseptic necrosis of unspecified femur: Secondary | ICD-10-CM | POA: Diagnosis not present

## 2015-10-08 DIAGNOSIS — L89159 Pressure ulcer of sacral region, unspecified stage: Secondary | ICD-10-CM | POA: Diagnosis not present

## 2015-10-08 DIAGNOSIS — Z79899 Other long term (current) drug therapy: Secondary | ICD-10-CM | POA: Diagnosis not present

## 2015-10-08 DIAGNOSIS — L8990 Pressure ulcer of unspecified site, unspecified stage: Secondary | ICD-10-CM

## 2015-10-08 MED ORDER — ADULT MULTIVITAMIN W/MINERALS CH: 1.0000 | ORAL_TABLET | Freq: Every day | ORAL | Status: DC

## 2015-10-08 MED ORDER — PANTOPRAZOLE SODIUM 40 MG PO TBEC: 40.0000 mg | DELAYED_RELEASE_TABLET | Freq: Every day | ORAL | Status: DC

## 2015-10-08 MED ORDER — K PHOS MONO-SOD PHOS DI & MONO 155-852-130 MG PO TABS: 750.0000 mg | ORAL_TABLET | Freq: Two times a day (BID) | ORAL | Status: DC

## 2015-10-08 MED ORDER — SODIUM BICARBONATE 650 MG PO TABS: 1300.0000 mg | ORAL_TABLET | Freq: Three times a day (TID) | ORAL | Status: DC

## 2015-10-08 MED ORDER — DARUNAVIR-COBICISTAT 800-150 MG PO TABS: 1.0000 | ORAL_TABLET | Freq: Every day | ORAL | Status: DC

## 2015-10-08 MED ORDER — MAGNESIUM CHLORIDE 64 MG PO TBEC: 1.0000 | DELAYED_RELEASE_TABLET | Freq: Every day | ORAL | Status: DC

## 2015-10-08 NOTE — Assessment & Plan Note (Signed)
Assessment/Plan: - refilled Protonix

## 2015-10-08 NOTE — Progress Notes (Signed)
Internal Medicine Clinic Attending  I saw and evaluated the patient.  I personally confirmed the key portions of the history and exam documented by Dr. Wallace and I reviewed pertinent patient test results.  The assessment, diagnosis, and plan were formulated together and I agree with the documentation in the resident's note. 

## 2015-10-08 NOTE — Telephone Encounter (Signed)
A user error has taken place: encounter opened in error, closed for administrative reasons.

## 2015-10-08 NOTE — Assessment & Plan Note (Signed)
Assessment/Plan: Pressure ulcer looks to be healing well with healthy appearing granulation tissue.  Daughter has been keeping clean and doing daily dressing changes as instructed by home health RN.  Instructed daughter to continue with daily changes.

## 2015-10-08 NOTE — Patient Instructions (Addendum)
Make an appointment to follow up with Dr. Megan Salon in the Infectious Disease Clinic as soon as possible.  They have been prescribing your chronic narcotics and you will need to receive these from them.    DO NOT TAKE YOUR WARFARIN THIS WEEKEND.  PLEASE FOLLOW UP WITH DR. Elie Confer ON Monday.  MAKE AN APPOINTMENT  ONLY TAKE 30mg  MS CONTIN TWICE DAILY AS PRESCRIBED.  YOU CAN ALSO TAKE OXYCODONE IR 5mg  EVERY 6 HOURS AS NEEDED FOR BREAKTHROUGH PAIN.  PLEASE TAKE THE FOLLOWING: [name], [name], AND [name].  STOP TAKING EVOTAZ.  FOLLOW UP WITH DR. Megan Salon  I have given you paper prescriptions for all the other medications that you needed refills on.  Naloxone (also called Narcan) is an antidote for opioid overdose. It works by neutralizing the opioids in your system and helping you breathe again. Naloxone only works if a person has opioids in their system; the medication doesn't work on other drugs. You can't get high from it and it is safe for nearly everyone. It's a Therapist, sports and has been used in programs all over the world.   The main sign of overdose is unresponsiveness. Other signs include:  Not breathing  Turning blue  Deep snoring  Vomiting  Gasping, gurgling  If you suspect an overdose, 1. CALL 911   2. Start rescue breathing   3. To give nasal naloxone:   If possible, put on gloves  Remove protective caps from the vial and the injector    Twist the vial into the injector until the needle penetrates the stopper, usually 3 half turns (do not push!)    Remove the cover from the injector   Twist on the nasal device    Spray 1 mL (same as 1 mg) of naloxone into each nostril    Continue rescue breathing  Watch the patient for breathing and signs of a response  If no response in 2 to 5 minutes, give the second dose  Store naloxone at room temperature. Protect from light. Do not freeze.

## 2015-10-08 NOTE — Assessment & Plan Note (Signed)
Assessment: Patient with labile INRs recently.  Yesterday was greater than 4 and patient instructed to hold Coumadin dose last night.  Today, it is still above 4.  Dr. [name] [facility] pharmacist) met with patient to discuss coumadin dosing.  Plan: - hold coumadin through this weekend - follow up with Dr. Elie Confer on Monday 12/12

## 2015-10-08 NOTE — Assessment & Plan Note (Signed)
Assessment: Patient has been taking 120mg  MS Contin bid and Oxycodone 5mg  q6h prn, but not requiring full allotment of Oxy doses due to over using MS Contin.  Daughter and myself concerned over this amount of morphine being consumed.  Patient reportedly falling (which poses great risk given his INR difficulties), somnolence, and having difficulty being aroused.  Discussed with patient and daughter the harms of being on this much narcotics.  Patient voiced understanding and stated he would take MS Contin 30mg  bid and oxycodone 5mg  q6h prn.  Patient did not require short course of MS Contin 30mg  as he received this at hospital discharge on 11/30 and has been using his 60mg  tablets since discharge.  He will use the 30mg  tablets until he can follow up with [facility].  Plan: - narcotics have been prescribed through [facility] and advised patient to follow up there in order to continue receiving narcotics as [facility] has not been providing his scripts - patient given narcan today with daughter and provided instruction on its use

## 2015-10-08 NOTE — Progress Notes (Signed)
Patient ID: [name], male   DOB: [birth year], 54 y.o.   MRN: [id number]   Subjective:   Patient ID: [name] male   DOB: 1960-12-26 54 y.o.   MRN: [id number]  HPI: [name] is a 54 y.o. male with past medical history as listed below.  Patient presents to [facility] today for wound check and discussion of patient pain medicines.  Please see assessment and plan for status of patients chronic medical conditions and issues addressed this visit.    Past Medical History  Diagnosis Date  . History of chicken pox   . Hyperlipidemia   . History of DVT of lower extremity     right  . Left hip pain 03/09/2013  . Pneumonia   . Chronic kidney disease   . Protein malnutrition ([place]) 08/2015  . HIV (human immunodeficiency virus infection) ([place])    Current Outpatient Prescriptions  Medication Sig Dispense Refill  . acetaminophen (TYLENOL) 500 MG tablet Take 1 tablet (500 mg total) by mouth every 4 (four) hours as needed for mild pain, moderate pain or headache. 30 tablet 0  . darunavir-cobicistat ([name]) 800-150 MG tablet Take 1 tablet by mouth daily. Swallow whole. Do NOT crush, break or chew tablets. Take with food. 30 tablet 1  . dolutegravir ([name]) 50 MG tablet Take 1 tablet (50 mg total) by mouth daily. 30 tablet 1  . fenofibrate (TRICOR) 145 MG tablet Take 1 tablet (145 mg total) by mouth daily. 30 tablet 5  . magnesium chloride (SLOW-MAG) 64 MG TBEC SR tablet Take 1 tablet (64 mg total) by mouth daily. 30 tablet 1  . morphine (MS CONTIN) 30 MG 12 hr tablet Take 1 tablet (30 mg total) by mouth every 12 (twelve) hours. 30 tablet 0  . Multiple Vitamin (MULTIVITAMIN WITH MINERALS) TABS tablet Take 1 tablet by mouth daily. 30 tablet 1  . oxyCODONE (OXY IR/ROXICODONE) 5 MG immediate release tablet Take 1 tablet (5 mg total) by mouth every 6 (six) hours as needed for severe pain. 30 tablet 0  . pantoprazole (PROTONIX) 40 MG tablet TAKE 1 TABLET BY MOUTH DAILY 90 tablet 1  . phosphorus  (K PHOS NEUTRAL) 155-852-130 MG tablet Take 3 tablets (750 mg total) by mouth 2 (two) times daily. 180 tablet 0  . potassium citrate (UROCIT-K) 10 MEQ (1080 MG) SR tablet Take 4 tablets (40 mEq total) by mouth 2 (two) times daily with a meal. 240 tablet 0  . sodium bicarbonate 650 MG tablet Take 2 tablets (1,300 mg total) by mouth 3 (three) times daily. 180 tablet 0  . [name] 50 MG tablet TAKE 1 TABLET (50 MG TOTAL) BY MOUTH DAILY. 30 tablet 11  . [name] 300 MG tablet TAKE 1 TABLET (300 MG TOTAL) BY MOUTH DAILY. 30 tablet 11  . warfarin (COUMADIN) 5 MG tablet Take 1 tablet (5 mg total) by mouth daily at 6 PM. 30 tablet 0   No current facility-administered medications for this visit.   Family History  Problem Relation Age of Onset  . Arthritis Mother   . Kidney disease Maternal Grandfather    Social History   Social History  . Marital Status: Married    Spouse Name: N/A  . Number of Children: 3  . Years of Education: N/A   Occupational History  .     Social History Main Topics  . Smoking status: Current Every Day Smoker -- 0.50 packs/day for 36 years    Types: Cigarettes  . Smokeless tobacco: Never  Used     Comment: ABOUT 1/2PPD.  Slowing down  . Alcohol Use: No  . Drug Use: No  . Sexual Activity: Not Currently     Comment: declined condoms   Other Topics Concern  . None   Social History Narrative   Review of Systems: Review of Systems  Constitutional: Positive for malaise/fatigue. Negative for fever and chills.  Eyes: Negative for blurred vision and pain.  Respiratory: Negative for cough and shortness of breath.   Cardiovascular: Negative for chest pain.  Gastrointestinal: Negative for nausea, vomiting and diarrhea.  Genitourinary: Negative for dysuria.  Musculoskeletal: Positive for falls.  Skin: Negative for rash.  Neurological: Negative for dizziness and headaches.    Objective:  Physical Exam: Filed Vitals:   10/08/15 1102  BP: 145/70  Pulse: 118  Temp:  97.8 F (36.6 C)  TempSrc: Oral  SpO2: 100%   Physical Exam  Constitutional: He is oriented to person, place, and time.  Thin appearing male, sitting in wheel chair, right AKA  HENT:  Head: Normocephalic and atraumatic.  Neck: Normal range of motion.  Cardiovascular: Normal rate.   Pulmonary/Chest: Effort normal.  Abdominal: Soft. There is no tenderness.  Neurological: He is alert and oriented to person, place, and time.  Skin:  Right gluteal/sacral wound present with healthy appearing granulation tissue  Psychiatric: He has a normal mood and affect.    Assessment & Plan:  Please see problem list for assessment and plan.  Case discussed with Dr. Evette Doffing.

## 2015-10-08 NOTE — Assessment & Plan Note (Signed)
Assessment: Patient and daughter present today with med list and stating they do not have [name] at home.  Per last discharge summary from 11/30, Patient to be one [name], [name] and [name].  Per last office visit with Dr. Megan Salon, patient HAART regimen was: Evotaz, [name], and [name].  After some chart review, discharge summary from Dr. Marlowe Sax notes that [name] was changed to [name] during November hospitalization due to concern for interaction with Protonix and Evotaz.  Patient informs me today that he has been taking Evotaz still.  Plan: - instructed patient that he needs to follow up with ID as soon as possible. - rx sent for [name].   - continue [name] and [name] - stop Evotaz - wrote down on patients AVS his ART regimen to be: [name], [name] and [name]

## 2015-10-11 ENCOUNTER — Other Ambulatory Visit: Payer: Self-pay | Admitting: *Deleted

## 2015-10-11 ENCOUNTER — Ambulatory Visit: Payer: Medicare Other | Admitting: Internal Medicine

## 2015-10-11 ENCOUNTER — Telehealth: Payer: Self-pay | Admitting: Licensed Clinical Social Worker

## 2015-10-11 ENCOUNTER — Ambulatory Visit (INDEPENDENT_AMBULATORY_CARE_PROVIDER_SITE_OTHER): Payer: Medicare Other | Admitting: Pharmacist

## 2015-10-11 DIAGNOSIS — Z7901 Long term (current) use of anticoagulants: Secondary | ICD-10-CM | POA: Diagnosis present

## 2015-10-11 DIAGNOSIS — I749 Embolism and thrombosis of unspecified artery: Secondary | ICD-10-CM | POA: Diagnosis not present

## 2015-10-11 LAB — POCT INR: INR: 3.1

## 2015-10-11 NOTE — Patient Outreach (Signed)
[place] [facility]) Care Management  10/11/2015  [name] 08-30-1961 [id number]  Transition of care (week 3)  RN spoke with both the pt and his daughter [name]) concerning pt's ongoing recovery since his recent discharge. Pt states he is eating more and feeling "much better". Eating more chicken and fish, states pt with plenty of vegetables and fluids. Pt verifies he is having good bowel movements with no problems. Pt states [facility] has not visited in awhile as his daughter is doing his dressing changes to his wound. Denies any swelling, redness and "it feels a lot better". RN stress the importance of clean dressing technique in prevent risk of infections. [employer] is working on his protheses for the needed adjustment over a period of two weeks. Pt will check with [facility] PT to see if PT services will be needed at that time. Pt currently non-ambulatory and states he needs help. States he will be getting a PCS from an agency that has been arranged and he is currently awaiting a home visit. RN inquired on any other needs related to community resources (none). Pt indicates he has not yet received his [employer] meals and remains receptive. RN offered community home visits if needed however pt is awaiting other services arranged to assist with his ongoing needs at this time. RN offered to follow up next week for ongoing transition of care ( pt receptive) stressing the importance of prevention measures. Will schedule accordingly.   Raina Mina, RN Care Management Coordinator [facility] Main Office [phone]

## 2015-10-11 NOTE — Patient Instructions (Signed)
Patient instructed to take medications as defined in the Anti-coagulation Track section of this encounter.  Patient instructed to take today's dose.  Patient verbalized understanding of these instructions.    

## 2015-10-11 NOTE — Telephone Encounter (Signed)
CSW received a request for PCS on behalf of [name].  CSW placed call to pt to inquire if pt is requesting assistance, pt aware of request and states he is in need of ADL assistance.  CSW informed [name] of the process and aware form will be forwarded to PCP.  Pt denied add'l needs.

## 2015-10-11 NOTE — Progress Notes (Unsigned)
Patient ID: [name], male   DOB: [birth year], 54 y.o.   MRN: [id number]  Patient was seen in clinic along with Darvin Neighbours, PharmD candidate. I agree with the assessment and plan of care documented.

## 2015-10-11 NOTE — Progress Notes (Signed)
Anti-Coagulation Progress Note  [name] is a 54 y.o. male who is currently on an anti-coagulation regimen.    RECENT RESULTS: Recent results are below, the most recent result is correlated with a dose of 40 mg. per week--but due to hypoprothrombinemic response---has been on HOLD for past 4 days: Lab Results  Component Value Date   INR 3.10 10/11/2015   INR 1.27 09/29/2015   INR 1.21 09/28/2015   PROTIME 38.4* 04/01/2012    ANTI-COAG DOSE: Anticoagulation Dose Instructions as of 10/11/2015      [name] Tue Wed Thu Fri Sat   New Dose 2.5 mg 2.5 mg 5 mg 2.5 mg 5 mg 2.5 mg 5 mg    Description        Patient according to documentation by others has been on as high as 40mg /wk regimen--for which he had a hypoprothrombinemic response requiring warfarin to have been held. For PAST 4 days--he has had wararin HELD.        ANTICOAG SUMMARY: Anticoagulation Episode Summary    Current INR goal 2.0-3.0  Next INR check 10/18/2015  INR from last check 3.10! 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  Patient instructed to take today's dose.  Patient verbalized understanding of these instructions.       FOLLOW-UP Return in 7 days (on 10/18/2015) for Follow up INR at 1030h.  Jorene Guest, [name] Pharm.D., CACP

## 2015-10-11 NOTE — Progress Notes (Unsigned)
Patient ID: [name], male   DOB: May 24, 1961, 54 y.o.   MRN: [id number] Medication Samples have been provided to the patient.  Drug: naloxone nasal spray Strength: 40m/mL Qty: 1 kit LOT: [id number].Date: 03/2016  The patient has been instructed regarding the correct time, dose, and frequency of taking this medication, including desired effects and most common side effects.   Samples signed for by JWaylan Rocher UE[facility] Of [facility] JerseyPharmD Candidate 10:08 AM 10/08/2015

## 2015-10-12 ENCOUNTER — Other Ambulatory Visit: Payer: Self-pay | Admitting: Licensed Clinical Social Worker

## 2015-10-12 ENCOUNTER — Other Ambulatory Visit: Payer: Self-pay | Admitting: *Deleted

## 2015-10-12 ENCOUNTER — Telehealth: Payer: Self-pay | Admitting: *Deleted

## 2015-10-12 DIAGNOSIS — G8929 Other chronic pain: Secondary | ICD-10-CM

## 2015-10-12 DIAGNOSIS — B20 Human immunodeficiency virus [HIV] disease: Secondary | ICD-10-CM

## 2015-10-12 MED ORDER — OXYCODONE HCL 5 MG PO TABS: 5.0000 mg | ORAL_TABLET | Freq: Four times a day (QID) | ORAL | Status: DC | PRN

## 2015-10-12 MED ORDER — DOLUTEGRAVIR SODIUM 50 MG PO TABS: 50.0000 mg | ORAL_TABLET | Freq: Every day | ORAL | Status: DC

## 2015-10-12 MED ORDER — MORPHINE SULFATE ER 30 MG PO TBCR: 30.0000 mg | EXTENDED_RELEASE_TABLET | Freq: Two times a day (BID) | ORAL | Status: DC

## 2015-10-12 MED ORDER — TENOFOVIR DISOPROXIL FUMARATE 300 MG PO TABS: ORAL_TABLET | ORAL | Status: DC

## 2015-10-12 MED ORDER — DARUNAVIR-COBICISTAT 800-150 MG PO TABS: 1.0000 | ORAL_TABLET | Freq: Every day | ORAL | Status: DC

## 2015-10-12 NOTE — Patient Outreach (Signed)
[facility]) Care Management  10/12/2015  [name] 1961-06-07 [id number]   Assessment-CSW completed outreach to patient on 10/12/15. Patient answered and provided HIPPA verifications. CSW introduced self, reason for call and of [place] social work services. CSW explained to patient that she is currently covering for [place]. Patient reports that he is "doing well" and has been "eating more." Patient expressed interest in the [facility] but shares that he does not know his [employer] ID number which is not in EPIC and which is needed to complete referral. Patient shares that CSW "would need to call the drug store" in order to get [facility] ID number. CSW informed patient that she will update CSW [employer] Patient is agreeable to this CSW mailing out information on food pantries and where to receive a free meal within the [facility]. Patient also expresses interest in completing an advance directive. CSW explained the document and Part A and Part B. Patient is also agreeable to this CSW mailing out a copy of an advance directive.  Plan-CSW will update CSW [employer] CSW will continue to be available to patient for all social work needs. CSW will send in basket message to Care Management Assistant to mail community resources.  Eula Fried, BSW, MSW, [email or web address] Phone: [phone] Fax: [phone]

## 2015-10-12 NOTE — Telephone Encounter (Signed)
Medication sent to pharmacy and Rx for pain meds printed. Patient is aware and will come to pick up soon.

## 2015-10-12 NOTE — Telephone Encounter (Signed)
Patient called and asked for refills on his pain medications. When discharged 09/29/15 the patient was given Oxy 5 mg #30 and MC Contin 30 mg #30 (usually 60 mg). Advised the patient will have to ask the doctor if we should go back to 60 mg an if we can go back to old pill count of #60 and #120. Also when he was in the patient advised his medications were changed and he needs refills on [facility]. Advised will get clarification from the doctor and send those as well. Advised the patient will call him back asap.

## 2015-10-12 NOTE — Telephone Encounter (Signed)
I reviewed his recent hospitalization and outpatient follow-up notes. Apparently there was concern that he was overmedicated with his narcotics and had been having some falls which is very dangerous given his anticoagulation. At his recent outpatient visit with Dr. Jule Ser his MS Contin was decreased from 60 mg twice daily to 30 mg twice daily. This was after discussion of the situation with Mio and his daughter. Notes indicate he was in agreement with the plan. He also continued on oxycodone for breakthrough pain. He should stay on his current doses of these medications.  While in the hospital his antiretroviral regimen was altered slightly because of his proton pump inhibitor therapy. He should be taking [name], [name] and [name]. Please schedule him to see me soon.

## 2015-10-13 NOTE — Patient Outreach (Signed)
[facility]) Care Management  10/13/2015  [name] [birth year] [id number]   Request received from Eula Fried, LCSW to mail patient information on local food assistance resources and advance directives. Information mailed today, 10/13/15.  [place] Management Assistant

## 2015-10-14 ENCOUNTER — Telehealth: Payer: Self-pay | Admitting: Pharmacist

## 2015-10-14 ENCOUNTER — Other Ambulatory Visit: Payer: Self-pay

## 2015-10-14 DIAGNOSIS — Z5181 Encounter for therapeutic drug level monitoring: Secondary | ICD-10-CM | POA: Diagnosis not present

## 2015-10-14 MED ORDER — COLLAGENASE 250 UNIT/GM EX OINT: 1.0000 "application " | TOPICAL_OINTMENT | Freq: Every day | CUTANEOUS | Status: DC

## 2015-10-14 NOTE — Telephone Encounter (Signed)
rec'd call from [place] with [facility] reporting that pt INR 8.0 this morning.  I have called this to Dr. Elie Confer, and will wait for further instruction.   Vinnie Level also requesting PCP send an order for Santyl to pt pharmacy for his wound care.

## 2015-10-14 NOTE — Telephone Encounter (Signed)
Received call by Denny Peon, RN, Triage Nurse in [facility] at 12:07PM 15-DEC-16 who had received call from Mardene Speak, RN with [facility]. Vinnie Level had collected home point of care INR on patient, result 8.0.  Reviewed patients lab values as most recently documented within [facility]. Albumin levels have fluctuated from 2.5  On 09/20/15 to 3.0 on 09/23/15. Additionally, there shows a new medication fenofibrate commenced upon 09/29/15 in [facility] for which both--the hypoalbunemia as well as the fenofibrate may cause hypoprothrombinemic responsiveness. Finally, I noted there were outside medications available for reconciliation. An entry by [facility] shows warfarin 10mg  on 10/06/15. I called the patient and spoke with patient as well as his daughter that they DO NOT have 10mg  strength warfarin tablets; ONLY the 5mg  strength warfarin tablets. Below is information regarding drug-drug-interaction with warfarin + fenofibrate:  Effects: fenofibrate may increase the hypoprothrombinemic effect of warfarin.  Mechanism: fenofibrate may potentiate the inhibition of vitamin K dependent clotting factor synthesis by warfarin.  Management: Dosage reduction of warfarin may be needed during concurrent administration with fenofibrate. Monitor coagulation status and adjust the dosage of warfarin accordingly.  We will OMIT/HOLD warfarin for TWO (2) days (today, Thursday October 14, 2015 AND Friday October 15, 2015). On Saturday December 17 and Sunday October 17, 2015 the patient will take ONLY ONE-HALF (1/2) of his FIVE MG (5mg ) strength warfarin tablets (dose = 2.5mg ). He will come to [facility] on Monday 19-DEC-16 as scheduled for repeat INR. Patient verbalized these instructions after having written them down. Daughter verbalized these instructions correctly as well. Patient was instructed that should there be any signs or symptoms of bleeding (blood in urine, stool, nose-bleeds, throwing up blood, coughing up blood, increased bruising, etc) to CALL ME  on my cell phone [phone] or go to the Emergency Room.

## 2015-10-15 ENCOUNTER — Telehealth: Payer: Self-pay | Admitting: *Deleted

## 2015-10-15 NOTE — Telephone Encounter (Signed)
Spoke to Marceline from [facility] who requested warfarin dosing instructions for patient. Informed Nira Conn of current regimen: hold warfarin on 10/14/15 and 10/15/15, take 1/2 tablet on 10/16/15 and 1/2 tablet on 10/17/15; return to [facility] Monday, 10/18/15. Heather verbalized understanding and stated that patient currently has home health visits twice per week in case we need to collaborate for further monitoring based on patient response.

## 2015-10-15 NOTE — Telephone Encounter (Signed)
Heather with [facility] [phone] called about critical high venipuncture  done 10/14/15 INR 5.72. Hilda Blades Emmerich Cryer RN 10/15/15 10:15AM

## 2015-10-18 ENCOUNTER — Ambulatory Visit (INDEPENDENT_AMBULATORY_CARE_PROVIDER_SITE_OTHER): Payer: Medicare Other | Admitting: Pharmacist

## 2015-10-18 ENCOUNTER — Other Ambulatory Visit: Payer: Self-pay | Admitting: *Deleted

## 2015-10-18 ENCOUNTER — Encounter: Payer: Self-pay | Admitting: *Deleted

## 2015-10-18 DIAGNOSIS — I749 Embolism and thrombosis of unspecified artery: Secondary | ICD-10-CM | POA: Diagnosis not present

## 2015-10-18 DIAGNOSIS — Z7901 Long term (current) use of anticoagulants: Secondary | ICD-10-CM

## 2015-10-18 LAB — POCT INR: INR: 4.1

## 2015-10-18 LAB — PROTIME-INR: INR: 5.7 — AB (ref 0.9–1.1)

## 2015-10-18 NOTE — Patient Instructions (Signed)
Patient instructed to take medications as defined in the Anti-coagulation Track section of this encounter.  Patient instructed to OMIT today's dose.  Patient verbalized understanding of these instructions.    

## 2015-10-18 NOTE — Progress Notes (Signed)
I have reviewed Dr. Gladstone Pih note. Patient is on coumadin for arterial embolism.  His coumadin is on hold given his elevated INR.

## 2015-10-18 NOTE — Patient Outreach (Addendum)
[facility]) Care Management  10/18/2015  Chester Sibert 1961/01/18 [id number]   Transition of care (week 4)  RN spoke with pt's daughter [name]) who indicates pt doing much better" with increased appetite and she believes he is gaining weight although she can not report a recent weight. States pt taking all his medication as prescribed and attending all medical appointments with no delays. States pt receiving more Ensure from the hospital and shelter and has a sufficient supply at this time. Reports recent follow up with Dr. Johney Maine who has informed pt to stop his Coumadin until tomorrow and will follow up with an appointment on this Thursday.  No other issues reported as pt continues to await [facility] on waiting list for PCS assistance in the home. Reports [facility] nurse visited this week and daughter states she is continue to change the right foot ulcer dressing as educated on via [place]. Believe the [facility] nurse will revisit later in this week but wan not provided a confirm scheduled date. Based upon the information provided RN proceed to offer community home visit as offered before and pt has opt to declined indicating he is doing much better and feels he does not need the [facility] services at this time but thankful for the follow up calls. Case will be closed from any further contacts concerning this referral as all goals and plan of care met as discussed. RN will continue to encouraged adherence with the goals reviewed today.   Raina Mina, RN Care Management Coordinator [facility] Main Office [phone]

## 2015-10-18 NOTE — Progress Notes (Signed)
Patient instructed to take medications as defined in the Anti-coagulation Track section of this encounter.  Patient instructed to OMIT today's dose.  Patient verbalized understanding of these instructions.    

## 2015-10-19 ENCOUNTER — Encounter: Payer: Self-pay | Admitting: *Deleted

## 2015-10-20 ENCOUNTER — Other Ambulatory Visit: Payer: Self-pay | Admitting: *Deleted

## 2015-10-20 NOTE — Patient Outreach (Signed)
[name]) Care Management  10/20/2015  [name] 10-Dec-1960 [id number]  CSW was able to make initial contact with patient today to briefly discuss social work services and resources through [employer]. Patient remembered having spoken with CSW colleague, Eula Fried during CSW's absence.  Ms. Blanch Media completed outreach to patient on 10/12/15. Patient answered and provided HIPAA verifications to both Ms. Blanch Media and CSW.  CSW introduced self, explained role and reason for the call.  Patient admitted that he is doing well and that his appetite has increased significantly.  Patient believes that he is actually gaining weight.  Patient is able to receive Ensure from [facility], as well as the shelters that he frequents to obtain a free hot meal. Patient reports taking his medications as prescribed and attending all physician appointments, with the assistance of his daughter, [name]. Patient continues to await PCS ([place]) through [facility], as patient is currently on the waiting list.  Patient is also receiving home health nursing services to perform wound care and dressing changes to right foot ulcer. Patient indicated that he is also on the waiting list to receive [employer] Well-[employer]ine through his [employer] benefit.  Patient is aware that he will be receiving a shipment of 30 free frozen meals through the [facility].  Patient received the Advanced Directives ([place] documents) packet that Ms. Blanch Media mailed to patient's home.  However, patient reported that he is not interested in completing these documents at this time.   CSW will perform a case closure on patient, as all goals of treatment have been met from social work standpoint and no additional social work needs have been identified at this time. CSW will notify patient's RNCM with [place]  Management, Raina Mina of CSW's plans to close patient's case. CSW will fax a correspondence letter to patient's Primary Care Physician, Dr. Viviano Simas to ensure that Dr. Lovena Le is aware of CSW's involvement with patient. CSW will submit a case closure request to Lurline Del, Care Management Assistant with [place] [facility], in the form of an In [employer].    Nat Christen, BSW, MSW, LCSW  Licensed Education officer, [facility]  Mailing Mobile [address] [place], [place] [address] Physical Address-[address], [place], [place] [address] Toll Free Main # [phone] Fax # [phone] Cell # [phone]  Fax # [phone]  [email or web address]

## 2015-10-21 ENCOUNTER — Encounter: Payer: Self-pay | Admitting: Internal Medicine

## 2015-10-21 ENCOUNTER — Ambulatory Visit (INDEPENDENT_AMBULATORY_CARE_PROVIDER_SITE_OTHER): Payer: Medicare Other | Admitting: Internal Medicine

## 2015-10-21 ENCOUNTER — Ambulatory Visit (INDEPENDENT_AMBULATORY_CARE_PROVIDER_SITE_OTHER): Payer: Medicare Other | Admitting: Pharmacist

## 2015-10-21 VITALS — BP 106/60 | HR 108 | Temp 97.7°F | Wt 103.8 lb

## 2015-10-21 DIAGNOSIS — L89159 Pressure ulcer of sacral region, unspecified stage: Secondary | ICD-10-CM | POA: Diagnosis present

## 2015-10-21 DIAGNOSIS — Z7901 Long term (current) use of anticoagulants: Secondary | ICD-10-CM | POA: Diagnosis present

## 2015-10-21 DIAGNOSIS — F17210 Nicotine dependence, cigarettes, uncomplicated: Secondary | ICD-10-CM

## 2015-10-21 DIAGNOSIS — I749 Embolism and thrombosis of unspecified artery: Secondary | ICD-10-CM | POA: Diagnosis not present

## 2015-10-21 DIAGNOSIS — L8990 Pressure ulcer of unspecified site, unspecified stage: Secondary | ICD-10-CM

## 2015-10-21 LAB — POCT INR: INR: 3.2

## 2015-10-21 NOTE — Patient Instructions (Signed)
Continue to use santyl on the wound.   I have made you an appointment with the [facility] at 2:30pm on January 24th. Please ARRIVE there at 2:15pm.   [facility]  [address] [place], [place] [address] Phone: [phone]

## 2015-10-21 NOTE — Assessment & Plan Note (Addendum)
Pt was instructed to come to clinic today by home health RN due increased sloth of wound. On exam the ulcer base is a healthy pink color without any exudates or pus. There is a white/yellow tinged fibrinous material that is attached at 9 o'clock and extends over entire ulcer however it can be lifted up without any discomfort to the patient. The patients daughter takes care of the wound every night with daily dressing changes and santyl ointment. The home health nurse helps manage the wound as well. Pt denies sitting on ulcer however pt's daughter states he does sit on it while at home. No surround erythema or pus from ulcer. Pt denies fevers, ns, and chills.   - Called the wound care center near [facility] and made patient an appointment on Jan 24 ( their next available appointment) at 2:30 pm for evaluation and removal of the fibrinous material. While there is no exudative or granulation tissue he may benefit from debridement to aid with faster healing as the ulcer extends beneath the skin edges and pt's daughter reports ulcer size has remained unchanged. - continue santyl, instructed pt's daughter to place it on the ulcer bed beneath the fibrinous material  - advised pt to keep pressure off of his sacral ulcer

## 2015-10-21 NOTE — Progress Notes (Signed)
I have reviewed Dr. Groce's note.  

## 2015-10-21 NOTE — Progress Notes (Signed)
Subjective:   Patient ID: [name] male   DOB: [birth year] 54 y.o.   MRN: [id number]  HPI: Mr.[name] is a 54 y.o. with past medical history as outlined below who presents to clinic for wound check and is accompanied by his daughter who cares for his wound. He also has a home health nurse who comes over to help assist with wound care. Per nurse they were advised to come to clinic for a wound check due to increased "slothing" from wound.   Please see problem list for status of the pt's chronic medical problems.  Past Medical History  Diagnosis Date  . History of chicken pox   . Hyperlipidemia   . History of DVT of lower extremity     right  . Left hip pain 03/09/2013  . Pneumonia   . Chronic kidney disease   . Protein malnutrition ([place]) 08/2015  . HIV (human immunodeficiency virus infection) ([place])    Current Outpatient Prescriptions  Medication Sig Dispense Refill  . acetaminophen (TYLENOL) 500 MG tablet Take 1 tablet (500 mg total) by mouth every 4 (four) hours as needed for mild pain, moderate pain or headache. 30 tablet 0  . collagenase (SANTYL) ointment Apply 1 application topically daily. 15 g 0  . darunavir-cobicistat ([name]) 800-150 MG tablet Take 1 tablet by mouth daily. Swallow whole. Do NOT crush, break or chew tablets. Take with food. 30 tablet 5  . dolutegravir ([name]) 50 MG tablet Take 1 tablet (50 mg total) by mouth daily. 30 tablet 5  . magnesium chloride (SLOW-MAG) 64 MG TBEC SR tablet Take 1 tablet (64 mg total) by mouth daily. 30 tablet 1  . morphine (MS CONTIN) 30 MG 12 hr tablet Take 1 tablet (30 mg total) by mouth every 12 (twelve) hours. 120 tablet 0  . Multiple Vitamin (MULTIVITAMIN WITH MINERALS) TABS tablet Take 1 tablet by mouth daily. 30 tablet 1  . oxyCODONE (OXY IR/ROXICODONE) 5 MG immediate release tablet Take 1 tablet (5 mg total) by mouth every 6 (six) hours as needed for severe pain. 60 tablet 0  . pantoprazole (PROTONIX) 40 MG tablet Take  1 tablet (40 mg total) by mouth daily. 90 tablet 1  . phosphorus (K PHOS NEUTRAL) 155-852-130 MG tablet Take 3 tablets (750 mg total) by mouth 2 (two) times daily. 180 tablet 0  . potassium citrate (UROCIT-K) 10 MEQ (1080 MG) SR tablet Take 4 tablets (40 mEq total) by mouth 2 (two) times daily with a meal. 240 tablet 0  . sodium bicarbonate 650 MG tablet Take 2 tablets (1,300 mg total) by mouth 3 (three) times daily. 180 tablet 0  . tenofovir ([name]) 300 MG tablet TAKE 1 TABLET (300 MG TOTAL) BY MOUTH DAILY. 30 tablet 5  . [name] 50 MG tablet TAKE 1 TABLET (50 MG TOTAL) BY MOUTH DAILY. 30 tablet 11  . warfarin (COUMADIN) 5 MG tablet Take 1 tablet (5 mg total) by mouth daily at 6 PM. 30 tablet 0   No current facility-administered medications for this visit.   Family History  Problem Relation Age of Onset  . Arthritis Mother   . Kidney disease Maternal Grandfather    Social History   Social History  . Marital Status: Married    Spouse Name: N/A  . Number of Children: 3  . Years of Education: N/A   Occupational History  .     Social History Main Topics  . Smoking status: Current Every Day Smoker -- 0.50 packs/day  for 36 years    Types: Cigarettes  . Smokeless tobacco: Never Used     Comment: ABOUT 1/2PPD.  Slowing down  . Alcohol Use: No  . Drug Use: No  . Sexual Activity: Not Currently     Comment: declined condoms   Other Topics Concern  . None   Social History Narrative   Review of Systems: Review of Systems  Constitutional: Negative for fever and chills.  Skin:       Sacral wound with increased slothing, size has remained unchanged  Neurological: Negative for weakness.    Objective:  Physical Exam: Filed Vitals:   10/21/15 1322  BP: 106/60  Pulse: 108  Temp: 97.7 F (36.5 C)  TempSrc: Oral  Weight: 103 lb 12.8 oz (47.083 kg)  SpO2: 100%   Physical Exam  Constitutional: He appears well-developed and well-nourished. No distress.  HENT:  Head:  Normocephalic.  Wearing sunglasses   Musculoskeletal:  Rt aka    Skin: He is not diaphoretic.  2 inch in diameter sacral ulcer with clean pink base and white edge at 12 o'clock. Skin edges healthy clean cut with apparent extension of ulcer beneath edges. At 9 o'clock there is a white/faint yellow tinged fibrous material that extends over the ulcer that can be lifted up to reveal the clean base of the ulcer without any pain. Ulcer is about 2-73mm deep    Assessment & Plan:   Please see problem based assessment and plan.

## 2015-10-21 NOTE — Patient Instructions (Signed)
Patient instructed to take medications as defined in the Anti-coagulation Track section of this encounter.  Patient instructed to take today's dose.  Patient verbalized understanding of these instructions.    

## 2015-10-21 NOTE — Progress Notes (Signed)
Anti-Coagulation Progress Note  [name] is a 54 y.o. male who is currently on an anti-coagulation regimen.    RECENT RESULTS: Recent results are below, the most recent result is correlated with a dose of 2.5mg  for past 2 days after multiple "HOLDS" or omission(s) of warfarin as required for marked hypoprothrombinemic response. Patient was commenced on fenofibrate on 09/29/15--the prescription attributed to a 27 assistant. Since that time, there have been multiple hypoprothrombinemic responses due to the DDI with warfarin and fenofibrate. After review of his cholesterol panel/trigycerides with Dr. Daryll Drown and a discussion with Dr. Danise Mina Kim--all agree that the best course of action is to DISCONTINUE the fenofibrate at this time and re-evaluate his cholesterol panel/triglycerides in 1-2 months. If re-elevation occurs there remains the option of a moderate intensity statin. Patient understands to DISCONTINUE the fenofibrate and follow the warfarin instructions provided (a decrease in total weekly dose consistent with the protocol/policy and procedure of the [facility]---additionally, since September 29, 2015 there is evidence of hypoalbunemia which too is likely contributing to the increased INR. Given that, this is the reason for continuing the dose de-escalation of warfarin.  Lab Results  Component Value Date   INR 3.2 10/21/2015   INR 4.10 10/18/2015   INR 5.7* 10/14/2015   PROTIME 38.4* 04/01/2012    ANTI-COAG DOSE: Anticoagulation Dose Instructions as of 10/21/2015      [name] Tue Wed Thu Fri Sat   New Dose 2.5 mg 2.5 mg 2.5 mg 2.5 mg 5 mg 2.5 mg 2.5 mg    Description        Take 1/2 tablet on all days of the week of your warfarin 5mg  strength tablet--EXCEPT on Thursdays of every week---on that day, take 1 tablet.        ANTICOAG SUMMARY: Anticoagulation Episode Summary    Current INR goal 2.0-3.0  Next INR check 11/02/2015  INR from last check 3.2! (10/21/2015)  Weekly max dose    Target end date Indefinite  [facility]  Preferred lab   Send INR reminders to ANTICOAG IMP   Indications  Embolism and thrombosis of artery ([place]) [I74.9] Long term current use of anticoagulant [Z79.01]        Comments       Anticoagulation Care Providers    Provider Role Specialty Phone number   Michel Bickers, MD  Infectious Diseases [phone]      ANTICOAG TODAY: Anticoagulation Summary as of 10/21/2015    INR goal 2.0-3.0  Selected INR 3.2! (10/21/2015)  Next INR check 11/02/2015  Target end date Indefinite   Indications  Embolism and thrombosis of artery ([place]) [I74.9] Long term current use of anticoagulant [Z79.01]      Anticoagulation Episode Summary    IN[facility]   Preferred lab    Send INR reminders to ANTICOAG IMP   Comments     Anticoagulation Care Providers    Provider Role Specialty Phone number   Michel Bickers, MD  Infectious Diseases [phone]      PATIENT INSTRUCTIONS: Patient Instructions  Patient instructed to take medications as defined in the Anti-coagulation Track section of this encounter.  Patient instructed to take today's dose.  Patient verbalized understanding of these instructions.       FOLLOW-UP Return in 12 days (on 11/02/2015) for Follow up INR at 1115h.  Jorene Guest, [name] Pharm.D., CACP

## 2015-10-21 NOTE — Progress Notes (Signed)
Collaborated with PCP to formulate plan with fenofibrate. Per Dr. Lovena Le, ok to d/c fenofibrate. Will follow patient to evaluate for need for lipid-lowering therapy in the future.

## 2015-10-25 ENCOUNTER — Other Ambulatory Visit: Payer: Self-pay | Admitting: Internal Medicine

## 2015-10-25 NOTE — Progress Notes (Signed)
Case discussed with Dr. Truong at the time of the visit.  We reviewed the resident's history and exam and pertinent patient test results.  I agree with the assessment, diagnosis, and plan of care documented in the resident's note. 

## 2015-10-25 NOTE — Addendum Note (Signed)
Addended by: Oval Linsey D on: 10/25/2015 08:04 AM   Modules accepted: Level of Service

## 2015-10-27 NOTE — Progress Notes (Signed)
I have reviewed Dr. Kim's note.  

## 2015-11-02 ENCOUNTER — Ambulatory Visit (INDEPENDENT_AMBULATORY_CARE_PROVIDER_SITE_OTHER): Payer: Medicare Other | Admitting: Pharmacist

## 2015-11-02 DIAGNOSIS — Z7901 Long term (current) use of anticoagulants: Secondary | ICD-10-CM | POA: Diagnosis not present

## 2015-11-02 DIAGNOSIS — I749 Embolism and thrombosis of unspecified artery: Secondary | ICD-10-CM | POA: Diagnosis not present

## 2015-11-02 LAB — POCT INR: INR: 1.4

## 2015-11-02 NOTE — Progress Notes (Signed)
Anti-Coagulation Progress Note  [name] is a 55 y.o. male who is currently on an anti-coagulation regimen.    RECENT RESULTS: Recent results are below, the most recent result is correlated with a dose of 20 mg. per week: Lab Results  Component Value Date   INR 1.40 11/02/2015   INR 3.2 10/21/2015   INR 4.10 10/18/2015   PROTIME 38.4* 04/01/2012    ANTI-COAG DOSE: Anticoagulation Dose Instructions as of 11/02/2015      [name] Tue Wed Thu Fri Sat   New Dose 2.5 mg 2.5 mg 5 mg 2.5 mg 5 mg 2.5 mg 5 mg    Description        Collaborated with PCP to formulate plan with fenofibrate. Per Dr. Lovena Le, ok to d/c fenofibrate. Will follow patient to evaluate for need for lipid-lowering therapy in the future.---[name] Take 1/2 tablet on all days of the week of your warfarin 5mg  strength tablet--EXCEPT on Thursdays of every week---on that day, take 1 tablet.        ANTICOAG SUMMARY: Anticoagulation Episode Summary    Current INR goal 2.0-3.0  Next INR check 11/08/2015  INR from last check 1.40! (11/02/2015)  Weekly max dose   Target end date Indefinite  [facility]  Preferred lab   Send INR reminders to ANTICOAG IMP   Indications  Embolism and thrombosis of artery ([name]) [I74.9] Long term current use of anticoagulant [Z79.01]        Comments       Anticoagulation Care Providers    Provider Role Specialty Phone number   Michel Bickers, MD  Infectious Diseases [phone]      ANTICOAG TODAY: Anticoagulation Summary as of 11/02/2015    INR goal 2.0-3.0  Selected INR 1.40! (11/02/2015)  Next INR check 11/08/2015  Target end date Indefinite   Indications  Embolism and thrombosis of artery ([name]) [I74.9] Long term current use of anticoagulant [Z79.01]      Anticoagulation Episode Summary    [facility]   Preferred lab    Send INR reminders to ANTICOAG IMP   Comments     Anticoagulation Care Providers    Provider Role Specialty  Phone number   Michel Bickers, MD  Infectious Diseases [phone]      PATIENT INSTRUCTIONS: Patient Instructions  Patient instructed to take medications as defined in the Anti-coagulation Track section of this encounter.  Patient instructed to take today's dose.  Patient verbalized understanding of these instructions.       FOLLOW-UP Return in 6 days (on 11/08/2015) for Follow up INR at 0915h.  Jorene Guest, [name] Pharm.D., CACP

## 2015-11-02 NOTE — Patient Instructions (Signed)
Patient instructed to take medications as defined in the Anti-coagulation Track section of this encounter.  Patient instructed to take today's dose.  Patient verbalized understanding of these instructions.    

## 2015-11-03 ENCOUNTER — Telehealth: Payer: Self-pay | Admitting: Internal Medicine

## 2015-11-03 ENCOUNTER — Telehealth: Payer: Self-pay | Admitting: *Deleted

## 2015-11-03 NOTE — Telephone Encounter (Signed)
Is VO for OT eval & treat OK for patient?  Also, RN reporting new unstagable pressure ulcer to left ischium measuring 0.5x0.5 with sloughy tissue to the center.  She would like VO for daily santyl dressings for this.  Wound on sacrum looking better per RN report.  They are aware of appointment with wound center on 1/24.

## 2015-11-03 NOTE — Telephone Encounter (Signed)
Pt called asking for an update on the paperwork regarding his prosthetic limb.  I consulted Corene Cornea, it was faxed today. I made multiple attempts to contact patient to no avail

## 2015-11-03 NOTE — Telephone Encounter (Signed)
Heather from [facility] requesting verbal order for occupational therapy. Please call back.

## 2015-11-04 ENCOUNTER — Other Ambulatory Visit: Payer: Self-pay | Admitting: Internal Medicine

## 2015-11-08 ENCOUNTER — Ambulatory Visit: Payer: Medicare Other

## 2015-11-10 ENCOUNTER — Ambulatory Visit (INDEPENDENT_AMBULATORY_CARE_PROVIDER_SITE_OTHER): Payer: Medicare Other | Admitting: Pharmacist

## 2015-11-10 DIAGNOSIS — I749 Embolism and thrombosis of unspecified artery: Secondary | ICD-10-CM | POA: Diagnosis not present

## 2015-11-10 DIAGNOSIS — Z7901 Long term (current) use of anticoagulants: Secondary | ICD-10-CM | POA: Diagnosis present

## 2015-11-10 LAB — POCT INR: INR: 1.4

## 2015-11-10 NOTE — Progress Notes (Signed)
Reviewed HIV positive man. 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 Routine hypercoag studies negative. On chronic coumadin. Was supratherapeuitc 10/05/15 & dose decreased. Now subtherapeutic.  Recommend lovenox bridge until coumadin therapeutic. I will discuss with Dr [name], pharmacist in clinic today. Dr Darnell Level

## 2015-11-10 NOTE — Progress Notes (Signed)
Anti-Coagulation Progress Note  [name] is a 55 y.o. male who is currently on an anti-coagulation regimen.    RECENT RESULTS: Recent results are below, the most recent result is correlated with a dose of 25 mg. per week: Lab Results  Component Value Date   INR 1.40 11/10/2015   INR 1.40 11/02/2015   INR 3.2 10/21/2015   PROTIME 38.4* 04/01/2012    ANTI-COAG DOSE: Anticoagulation Dose Instructions as of 11/10/2015      [name] Tue Wed Thu Fri Sat   New Dose 5 mg 5 mg 5 mg 5 mg 5 mg 5 mg 5 mg    Description        Collaborated with PCP to formulate plan with fenofibrate. Per Dr. Lovena Le, ok to d/c fenofibrate. Will follow patient to evaluate for need for lipid-lowering therapy in the future.---[name] Take 1/2 tablet on all days of the week of your warfarin 5mg  strength tablet--EXCEPT on Thursdays of every week---on that day, take 1 tablet.        ANTICOAG SUMMARY: Anticoagulation Episode Summary    Current INR goal 2.0-3.0  Next INR check 11/19/2015  INR from last check 1.40! (11/10/2015)  Weekly max dose   Target end date Indefinite  [facility]  Preferred lab   Send INR reminders to ANTICOAG IMP   Indications  Embolism and thrombosis of artery ([name]) [I74.9] Long term current use of anticoagulant [Z79.01]        Comments       Anticoagulation Care Providers    Provider Role Specialty Phone number   Michel Bickers, MD  Infectious Diseases [phone]      ANTICOAG TODAY: Anticoagulation Summary as of 11/10/2015    INR goal 2.0-3.0  Selected INR 1.40! (11/10/2015)  Next INR check 11/19/2015  Target end date Indefinite   Indications  Embolism and thrombosis of artery ([name]) [I74.9] Long term current use of anticoagulant [Z79.01]      Anticoagulation Episode Summary    [facility]   Preferred lab    Send INR reminders to ANTICOAG IMP   Comments     Anticoagulation Care Providers    Provider Role Specialty Phone  number   Michel Bickers, MD  Infectious Diseases [phone]      PATIENT INSTRUCTIONS: Patient Instructions  Patient instructed to take medications as defined in the Anti-coagulation Track section of this encounter.  Patient instructed to take today's dose.  Patient verbalized understanding of these instructions.       FOLLOW-UP Return in 9 days (on 11/19/2015) for Follow up INR at 1000h.  [name], [name] Pharm.D., CACP

## 2015-11-10 NOTE — Patient Instructions (Signed)
Patient instructed to take medications as defined in the Anti-coagulation Track section of this encounter.  Patient instructed to take today's dose.  Patient verbalized understanding of these instructions.    

## 2015-11-16 ENCOUNTER — Ambulatory Visit (INDEPENDENT_AMBULATORY_CARE_PROVIDER_SITE_OTHER): Payer: Medicare Other | Admitting: Internal Medicine

## 2015-11-16 ENCOUNTER — Encounter: Payer: Self-pay | Admitting: Internal Medicine

## 2015-11-16 VITALS — BP 129/79 | HR 103 | Temp 98.8°F

## 2015-11-16 DIAGNOSIS — B20 Human immunodeficiency virus [HIV] disease: Secondary | ICD-10-CM | POA: Diagnosis not present

## 2015-11-16 DIAGNOSIS — G8929 Other chronic pain: Secondary | ICD-10-CM | POA: Diagnosis not present

## 2015-11-16 DIAGNOSIS — I749 Embolism and thrombosis of unspecified artery: Secondary | ICD-10-CM

## 2015-11-16 DIAGNOSIS — G546 Phantom limb syndrome with pain: Secondary | ICD-10-CM

## 2015-11-16 DIAGNOSIS — Z79899 Other long term (current) drug therapy: Secondary | ICD-10-CM

## 2015-11-16 MED ORDER — MORPHINE SULFATE ER 30 MG PO TBCR: 30.0000 mg | EXTENDED_RELEASE_TABLET | Freq: Three times a day (TID) | ORAL | Status: DC

## 2015-11-16 MED ORDER — OXYCODONE HCL 5 MG PO TABS
5.0000 mg | ORAL_TABLET | Freq: Four times a day (QID) | ORAL | Status: DC | PRN
Start: 2015-11-16 — End: 2015-12-20

## 2015-11-16 MED ORDER — MORPHINE SULFATE ER 15 MG PO TBCR: 15.0000 mg | EXTENDED_RELEASE_TABLET | Freq: Three times a day (TID) | ORAL | Status: DC

## 2015-11-16 NOTE — Assessment & Plan Note (Signed)
His HIV infection remains under excellent control. He recently changed his Evotaz to [name] to avoid a drug drug interaction with his proton pump inhibitor he was put on when he was hospitalized. He continues taking [name] and [name]. He will follow-up after lab work in 6 months.

## 2015-11-16 NOTE — Assessment & Plan Note (Addendum)
He has chronic pain with tolerance to narcotics. Prior to his recent hospitalization he was taking MS Contin 120 mg twice daily for maintenance pain control. While hospitalized this was cut down to 30 mg twice daily. Since discharge she is found that that dose is inadequate to control his chronic pain. Recently he has been taking 90 mg in the morning, 90 mg in the evening and usually 30 mg in the middle the night. He only rarely takes his oxycodone for breakthrough pain. I had a meeting today along with his daughter with our pharmacist, Onnie Boer, to discuss his pain regimen. After much discussion we decided to change him to a fixed regimen of MS Contin 45 mg every 8 hours. He will also continue to use oxycodone for breakthrough pain. This was discussed with [name] and his daughter.

## 2015-11-16 NOTE — Progress Notes (Signed)
HPI: [name] is a 55 y.o. male who is here for his routine HIV visit. Rx was asked for recommendation regarding his pain meds.   Allergies: Allergies  Allergen Reactions  . Dapsone     REACTION: fever  . Sulfamethoxazole-Trimethoprim     REACTION: fever    Vitals: Temp: 98.8 F (37.1 C) (01/17 1115) Temp Source: Oral (01/17 1115) BP: 129/79 mmHg (01/17 1115) Pulse Rate: 103 (01/17 1115)  Past Medical History: Past Medical History  Diagnosis Date  . History of chicken pox   . Hyperlipidemia   . History of DVT of lower extremity     right  . Left hip pain 03/09/2013  . Pneumonia   . Chronic kidney disease   . Protein malnutrition ([place]) 08/2015  . HIV (human immunodeficiency virus infection) ([place])     Social History: Social History   Social History  . Marital Status: Married    Spouse Name: N/A  . Number of Children: 3  . Years of Education: N/A   Occupational History  .     Social History Main Topics  . Smoking status: Current Every Day Smoker -- 0.50 packs/day for 36 years    Types: Cigarettes  . Smokeless tobacco: Never Used     Comment: ABOUT 1/2PPD.  Slowing down  . Alcohol Use: No  . Drug Use: No  . Sexual Activity: Not Currently     Comment: declined condoms   Other Topics Concern  . None   Social History Narrative    Previous Regimen:   Current Regimen: TDF/DTG/Prez  Labs: HIV 1 RNA QUANT (copies/mL)  Date Value  08/23/2015 <20  03/12/2015 <20  08/31/2014 <20   CD4 T CELL ABS (/uL)  Date Value  08/23/2015 700  03/12/2015 1210  08/31/2014 710   HEP B S AB (no units)  Date Value  12/24/2006 YES   HEPATITIS B SURFACE AG (no units)  Date Value  06/23/2009 NEGATIVE   HCV AB (no units)  Date Value  06/23/2009 NEGATIVE    CrCl: CrCl cannot be calculated (Unknown ideal weight.).  Lipids:    Component Value Date/Time   CHOL 146 08/31/2014 0958   TRIG 100 09/15/2015 0640   HDL 39* 08/31/2014 0958   CHOLHDL 3.7  08/31/2014 0958   VLDL 18 08/31/2014 0958   LDLCALC 89 08/31/2014 0958    Assessment: [name] is here for his HIV f/u. He was recently hospitalized for a fall. He is also on coumadin. His pain meds was decreased at discharge. He stated that he was on around 200mg  morphine per day. He is currently taking 60mg  BID and also occasionally 30mg  at 2 or 3 AM. He also has PRN oxycodone. Explained to him about our plan to change his regimen so that it can provide around the clock pain management and decrease the need for that PRN that he has to take at night time. At first, I thought he was on Oxycontin but he is on MS Contin instead. Called him back to disregard the calendar that I made for him. We are going to change is MS contin to a standing regimen instead.   Recommendations:  Change MS Contin to 45mg  PO q8 (0700, 1600, 2400) Message was addressed with him  Wilfred Lacy, PharmD Clinical Infectious [place] [facility] 11/16/2015, 2:46 PM

## 2015-11-16 NOTE — Progress Notes (Signed)
Patient Active Problem List   Diagnosis Date Noted  . Phantom limb syndrome with pain ([place]) 03/21/2012    Priority: High  . Long term current use of anticoagulant 11/20/2010    Priority: High  . Embolism and thrombosis of artery ([place]) 07/19/2009    Priority: High  . Human immunodeficiency virus (HIV) disease ([place]) 11/10/2006    Priority: High  . DYSLIPIDEMIA 11/10/2006    Priority: High  . CIGARETTE SMOKER 11/10/2006    Priority: High  . CKD (chronic kidney disease) 02/10/2014    Priority: Medium  . Hyperglycemia 12/29/2013    Priority: Medium  . Unintentional weight loss 11/04/2013    Priority: Medium  . Debilitated 09/22/2015  . Renal tubular acidosis   . AIDS ([place])   . AKI (acute kidney injury) ([place])   . Acute blood loss anemia   . History of above knee amputation ([place])   . Hyponatremia   . Hypophosphatemia   . SBO (small bowel obstruction) ([place])   . Hyperosmolality and/or hypernatremia   . Hypomagnesemia   . Pressure ulcer 09/13/2015  . Protein-calorie malnutrition, severe 09/13/2015  . Abdominal pain   . Pneumonia due to Klebsiella pneumoniae ([place])   . Septic shock ([place]) 09/10/2015  . Gram-negative bacteremia ([place]) 09/10/2015  . Polyuria 08/06/2015  . Prediabetes 08/06/2015  . Hematuria 08/06/2015  . UTI (urinary tract infection) 05/14/2015  . Avascular necrosis of femoral head ([place]) 05/14/2015  . Left adrenal mass ([place]) 05/14/2015  . Hypokalemia 10/08/2014  . Hyperbilirubinemia 12/29/2013  . Left hip pain 03/09/2013  . Preventative health care 03/09/2013  . ERECTILE DYSFUNCTION, ORGANIC 04/12/2010  . GERD 08/23/2009  . METHICILLIN RESISTANT STAPHYLOCOCCUS AUREUS INFECTION 08/05/2009  . ENLARGEMENT OF LYMPH NODES 07/20/2009  . HYPOGONADISM 07/15/2009  . DEFICIENCY OF OTHER VITAMINS 07/15/2009  . PULMONARY NODULE 07/15/2009  . Status post above knee amputation ([place]) 07/15/2009  . SHINGLES, RECURRENT 11/10/2006  . HEPATITIS B 11/10/2006  .  PSYCHOSIS 11/10/2006  . Alcohol abuse 11/10/2006  . PERIPHERAL NEUROPATHY 11/10/2006  . ALLERGIC RHINITIS 11/10/2006  . DENTAL CARIES 11/10/2006    Patient's Medications  New Prescriptions   MORPHINE (MS CONTIN) 15 MG 12 HR TABLET    Take 1 tablet (15 mg total) by mouth every 8 (eight) hours.  Previous Medications   ACETAMINOPHEN (TYLENOL) 500 MG TABLET    Take 1 tablet (500 mg total) by mouth every 4 (four) hours as needed for mild pain, moderate pain or headache.   DARUNAVIR-COBICISTAT ([name]) 800-150 MG TABLET    Take 1 tablet by mouth daily. Swallow whole. Do NOT crush, break or chew tablets. Take with food.   DOLUTEGRAVIR ([name]) 50 MG TABLET    Take 1 tablet (50 mg total) by mouth daily.   MAGNESIUM CHLORIDE (SLOW-MAG) 64 MG TBEC SR TABLET    Take 1 tablet (64 mg total) by mouth daily.   MULTIPLE VITAMIN (MULTIVITAMIN WITH MINERALS) TABS TABLET    Take 1 tablet by mouth daily.   PANTOPRAZOLE (PROTONIX) 40 MG TABLET    Take 1 tablet (40 mg total) by mouth daily.   PHOSPHORUS (K PHOS NEUTRAL) 155-852-130 MG TABLET    Take 3 tablets (750 mg total) by mouth 2 (two) times daily.   POTASSIUM CHLORIDE SA (K-DUR,KLOR-CON) 20 MEQ TABLET    TAKE 1 TABLET BY MOUTH ONCE DAILY   POTASSIUM CITRATE (UROCIT-K) 10 MEQ (1080 MG) SR TABLET    Take 4 tablets (40  mEq total) by mouth 2 (two) times daily with a meal.   SANTYL OINTMENT    APPLY TOPICALLY TO THE AFFECTED AREA(S) DAILY   SODIUM BICARBONATE 650 MG TABLET    Take 2 tablets (1,300 mg total) by mouth 3 (three) times daily.   TENOFOVIR ([name]) 300 MG TABLET    TAKE 1 TABLET (300 MG TOTAL) BY MOUTH DAILY.   WARFARIN (COUMADIN) 5 MG TABLET    Take 1 tablet (5 mg total) by mouth daily at 6 PM.  Modified Medications   Modified Medication Previous Medication   MORPHINE (MS CONTIN) 30 MG 12 HR TABLET morphine (MS CONTIN) 30 MG 12 hr tablet      Take 1 tablet (30 mg total) by mouth every 8 (eight) hours.    Take 1 tablet (30 mg total) by mouth  every 12 (twelve) hours.   OXYCODONE (OXY IR/ROXICODONE) 5 MG IMMEDIATE RELEASE TABLET oxyCODONE (OXY IR/ROXICODONE) 5 MG immediate release tablet      Take 1 tablet (5 mg total) by mouth every 6 (six) hours as needed for severe pain.    Take 1 tablet (5 mg total) by mouth every 6 (six) hours as needed for severe pain.  Discontinued Medications   [name] 50 MG TABLET    TAKE 1 TABLET (50 MG TOTAL) BY MOUTH DAILY.    Subjective: Sayf is in for his routine HIV follow-up visit. He was recently hospitalized with a GI bleed and sepsis. He lost a great deal of weight during hospitalization. He also developed some pressure sores. He recently had his chronic pain medication decreased. Before his hospitalization had been taking MS Contin 120 mg twice daily. Apparently his daughter expressed concern that he had had some falls putting him at risk for major bleeding. His MS Contin was decreased to 30 mg twice daily. He states that that has been completely inadequate for controlling his chronic phantom limb pain. He has actually been taking 90 mg each morning, 90 mg each evening and 30 mg in the middle of the night. He only rarely takes oxycodone for breakthrough pain. While hospitalized we changed Evotaz to [name] after he was started on a proton pump inhibitor. He was continued on [name] and [name]. He states that he is feeling better since discharge. His appetite is improved and he is gaining weight. His daughter is managing his wound care and dressing changes with oversight from advanced home care. He has an initial visit at the wound center on January 24.   Review of Systems: Review of Systems  Constitutional: Positive for weight loss and malaise/fatigue. Negative for fever, chills and diaphoresis.  HENT: Negative for sore throat.   Respiratory: Negative for cough, sputum production and shortness of breath.   Cardiovascular: Negative for chest pain.  Gastrointestinal: Negative for nausea, vomiting and  diarrhea.  Genitourinary: Negative for dysuria.  Musculoskeletal: Positive for joint pain.       He was fitted for a new AKA prosthesis this morning to account for his recent weight loss.  Skin: Negative for rash.  Neurological: Negative for focal weakness and headaches.  Psychiatric/Behavioral: Negative for depression and substance abuse. The patient is not nervous/anxious.     Past Medical History  Diagnosis Date  . History of chicken pox   . Hyperlipidemia   . History of DVT of lower extremity     right  . Left hip pain 03/09/2013  . Pneumonia   . Chronic kidney disease   . Protein malnutrition ([place])  08/2015  . HIV (human immunodeficiency virus infection) ([place])     Social History  Substance Use Topics  . Smoking status: Current Every Day Smoker -- 0.50 packs/day for 36 years    Types: Cigarettes  . Smokeless tobacco: Never Used     Comment: ABOUT 1/2PPD.  Slowing down  . Alcohol Use: No    Family History  Problem Relation Age of Onset  . Arthritis Mother   . Kidney disease Maternal Grandfather     Allergies  Allergen Reactions  . Dapsone     REACTION: fever  . Sulfamethoxazole-Trimethoprim     REACTION: fever    Objective:  Filed Vitals:   11/16/15 1115  BP: 129/79  Pulse: 103  Temp: 98.8 F (37.1 C)  TempSrc: Oral   There is no weight on file to calculate BMI.  Physical Exam  Constitutional: He is oriented to person, place, and time. No distress.  Johntay lost 28 pounds while hospitalized but has been able to regain 12 pounds since then. He is still cachectic. He is alert and feisty today.  HENT:  Mouth/Throat: No oropharyngeal exudate.  Eyes: Conjunctivae are normal.  Cardiovascular: Normal rate and regular rhythm.   No murmur heard. Pulmonary/Chest: Breath sounds normal.  Abdominal: Soft. He exhibits no mass. There is no tenderness.  Musculoskeletal: Normal range of motion.  He is seated in his wheelchair.  Neurological: He is alert and oriented  to person, place, and time.  Skin: No rash noted.  His pressure sores were not examined.  Psychiatric: Mood and affect normal.    Lab Results Lab Results  Component Value Date   WBC 10.5 09/26/2015   HGB 9.9* 09/26/2015   HCT 29.5* 09/26/2015   MCV 89.4 09/26/2015   PLT 403* 09/26/2015    Lab Results  Component Value Date   CREATININE 1.11 09/28/2015   BUN 20 09/28/2015   NA 141 09/28/2015   K 3.5 09/28/2015   CL 116* 09/28/2015   CO2 19* 09/28/2015    Lab Results  Component Value Date   ALT 34 09/23/2015   AST 29 09/23/2015   ALKPHOS 165* 09/23/2015   BILITOT 0.6 09/23/2015    Lab Results  Component Value Date   CHOL 146 08/31/2014   HDL 39* 08/31/2014   LDLCALC 89 08/31/2014   TRIG 100 09/15/2015   CHOLHDL 3.7 08/31/2014    Lab Results HIV 1 RNA QUANT (copies/mL)  Date Value  08/23/2015 <20  03/12/2015 <20  08/31/2014 <20   CD4 T CELL ABS (/uL)  Date Value  08/23/2015 700  03/12/2015 1210  08/31/2014 710      Problem List Items Addressed This Visit      High   Human immunodeficiency virus (HIV) disease ([place])    His HIV infection remains under excellent control. He recently changed his Evotaz to [name] to avoid a drug drug interaction with his proton pump inhibitor he was put on when he was hospitalized. He continues taking [name] and [name]. He will follow-up after lab work in 6 months.      Relevant Orders   T-helper cell (CD4)- ([facility] clinic only)   HIV 1 RNA quant-no reflex-bld   CBC   Comprehensive metabolic panel   Lipid panel   RPR   Phantom limb syndrome with pain ([place])    He has chronic pain with tolerance to narcotics. Prior to his recent hospitalization he was taking MS Contin 120 mg twice daily for maintenance pain control. While hospitalized  this was cut down to 30 mg twice daily. Since discharge she is found that that dose is inadequate to control his chronic pain. Recently he has been taking 90 mg in the morning, 90 mg in the  evening and usually 30 mg in the middle the night. He only rarely takes his oxycodone for breakthrough pain. I had a meeting today along with his daughter with our pharmacist, Onnie Boer, to discuss his pain regimen. After much discussion we decided to change him to a fixed regimen of MS Contin 45 mg every 8 hours. He will also continue to use oxycodone for breakthrough pain. This was discussed with [place] and his daughter.      Relevant Medications   morphine (MS CONTIN) 15 MG 12 hr tablet    Other Visit Diagnoses    Chronic pain    -  Primary    Relevant Medications    morphine (MS CONTIN) 30 MG 12 hr tablet    morphine (MS CONTIN) 15 MG 12 hr tablet    oxyCODONE (OXY IR/ROXICODONE) 5 MG immediate release tablet    Encounter for long-term (current) use of medications        Relevant Orders    Lipid panel         Michel Bickers, MD [facility] [place] [phone] pager   [phone] cell 11/16/2015, 4:05 PM

## 2015-11-19 ENCOUNTER — Ambulatory Visit (INDEPENDENT_AMBULATORY_CARE_PROVIDER_SITE_OTHER): Payer: Medicare Other | Admitting: Pharmacist

## 2015-11-19 DIAGNOSIS — Z7901 Long term (current) use of anticoagulants: Secondary | ICD-10-CM | POA: Diagnosis present

## 2015-11-19 DIAGNOSIS — I749 Embolism and thrombosis of unspecified artery: Secondary | ICD-10-CM | POA: Diagnosis not present

## 2015-11-19 LAB — POCT INR: INR: 2.6

## 2015-11-19 NOTE — Progress Notes (Signed)
Anti-Coagulation Progress Note  [name] is a 55 y.o. male who is currently on an anti-coagulation regimen.    RECENT RESULTS: Recent results are below, the most recent result is correlated with a dose of 35 mg. per week: Lab Results  Component Value Date   INR 2.60 11/19/2015   INR 1.40 11/10/2015   INR 1.40 11/02/2015   PROTIME 38.4* 04/01/2012    ANTI-COAG DOSE: Anticoagulation Dose Instructions as of 11/19/2015      [name] Tue Wed Thu Fri Sat   New Dose 5 mg 5 mg 5 mg 5 mg 5 mg 5 mg 5 mg    Description        Collaborated with PCP to formulate plan with fenofibrate. Per Dr. Lovena Le, ok to d/c fenofibrate. Will follow patient to evaluate for need for lipid-lowering therapy in the future.---[name] Take 1/2 tablet on all days of the week of your warfarin 5mg  strength tablet--EXCEPT on Thursdays of every week---on that day, take 1 tablet.        ANTICOAG SUMMARY: Anticoagulation Episode Summary    Current INR goal 2.0-3.0  Next INR check 11/29/2015  INR from last check 2.60 (11/19/2015)  Weekly max dose   Target end date Indefinite  [facility]  Preferred lab   Send INR reminders to ANTICOAG IMP   Indications  Embolism and thrombosis of artery ([place]) [I74.9] Long term current use of anticoagulant [Z79.01]        Comments       Anticoagulation Care Providers    Provider Role Specialty Phone number   Matthew Bickers, MD  Infectious Diseases [phone]      ANTICOAG TODAY: Anticoagulation Summary as of 11/19/2015    INR goal 2.0-3.0  Selected INR 2.60 (11/19/2015)  Next INR check 11/29/2015  Target end date Indefinite   Indications  Embolism and thrombosis of artery ([place]) [I74.9] Long term current use of anticoagulant [Z79.01]      Anticoagulation Episode Summary    [facility]   Preferred lab    Send INR reminders to ANTICOAG IMP   Comments     Anticoagulation Care Providers    Provider Role Specialty Phone  number   Matthew Bickers, MD  Infectious Diseases [phone]      PATIENT INSTRUCTIONS: Patient Instructions  Patient instructed to take medications as defined in the Anti-coagulation Track section of this encounter.  Patient instructed to take today's dose.  Patient verbalized understanding of these instructions.       FOLLOW-UP Return in 10 days (on 11/29/2015) for Follow up INR at 1000h.  [name], [name] Pharm.D., CACP

## 2015-11-19 NOTE — Patient Instructions (Signed)
Patient instructed to take medications as defined in the Anti-coagulation Track section of this encounter.  Patient instructed to take today's dose.  Patient verbalized understanding of these instructions.    

## 2015-11-19 NOTE — Progress Notes (Signed)
INTERNAL MEDICINE TEACHING ATTENDING ADDENDUM - Maelin Kurkowski M.D  Duration- indefinite, Indication- VTE, INR- therapeutic. Agree with pharmacy recommendations as outlined in their note.    

## 2015-11-23 ENCOUNTER — Encounter (HOSPITAL_BASED_OUTPATIENT_CLINIC_OR_DEPARTMENT_OTHER): Payer: Medicare Other | Attending: Internal Medicine

## 2015-11-23 DIAGNOSIS — Z21 Asymptomatic human immunodeficiency virus [HIV] infection status: Secondary | ICD-10-CM | POA: Diagnosis not present

## 2015-11-23 DIAGNOSIS — L89153 Pressure ulcer of sacral region, stage 3: Secondary | ICD-10-CM | POA: Diagnosis not present

## 2015-11-23 DIAGNOSIS — M1990 Unspecified osteoarthritis, unspecified site: Secondary | ICD-10-CM | POA: Insufficient documentation

## 2015-11-23 DIAGNOSIS — Z86718 Personal history of other venous thrombosis and embolism: Secondary | ICD-10-CM | POA: Insufficient documentation

## 2015-11-23 DIAGNOSIS — E46 Unspecified protein-calorie malnutrition: Secondary | ICD-10-CM | POA: Diagnosis not present

## 2015-11-24 DIAGNOSIS — R531 Weakness: Secondary | ICD-10-CM | POA: Diagnosis not present

## 2015-11-24 DIAGNOSIS — Z4789 Encounter for other orthopedic aftercare: Secondary | ICD-10-CM | POA: Diagnosis not present

## 2015-11-24 DIAGNOSIS — Z89619 Acquired absence of unspecified leg above knee: Secondary | ICD-10-CM | POA: Diagnosis not present

## 2015-11-29 ENCOUNTER — Encounter: Payer: Self-pay | Admitting: Licensed Clinical Social Worker

## 2015-11-29 ENCOUNTER — Ambulatory Visit (INDEPENDENT_AMBULATORY_CARE_PROVIDER_SITE_OTHER): Payer: Medicare Other | Admitting: Pharmacist

## 2015-11-29 DIAGNOSIS — Z7901 Long term (current) use of anticoagulants: Secondary | ICD-10-CM | POA: Diagnosis present

## 2015-11-29 DIAGNOSIS — I749 Embolism and thrombosis of unspecified artery: Secondary | ICD-10-CM | POA: Diagnosis not present

## 2015-11-29 LAB — POCT INR

## 2015-11-29 NOTE — Progress Notes (Signed)
Mr. Spangler requesting if [facility] pantry has any protein/nutritional supplements in [facility] pantry.  Patients states he takes 4 Ensure a day to supplement his protein and is in need of add'l for healing of a wound at this time.  Mr. Delrosario is linked with [facility], but states he only receives enough to last him 2 weeks and he is currently out.  CSW checking with attending physician today regarding the status of pt's CKD. Pt appropriate for supplements in [facility] pantry.  CSW provided pt with protein supplements donated to [facility] pantry.  Pt notified of the protein content of the each brand.

## 2015-11-29 NOTE — Progress Notes (Signed)
Anti-Coagulation Progress Note  Rowan Bouwkamp is a 55 y.o. male who is currently on an anti-coagulation regimen.    RECENT RESULTS: Recent results are below, the most recent result is correlated with a dose of 32.5 mg. per week: Lab Results  Component Value Date   INR 2/60 11/29/2015   INR 2.60 11/19/2015   INR 1.40 11/10/2015   PROTIME 38.4* 04/01/2012    ANTI-COAG DOSE: Anticoagulation Dose Instructions as of 11/29/2015      [name] Tue Wed Thu Fri Sat   New Dose 5 mg 5 mg 5 mg 5 mg 5 mg 2.5 mg 5 mg    Description        Collaborated with PCP to formulate plan with fenofibrate. Per Dr. Lovena Le, ok to d/c fenofibrate. Will follow patient to evaluate for need for lipid-lowering therapy in the future.---[name] Take 1/2 tablet on all days of the week of your warfarin 5mg  strength tablet--EXCEPT on Thursdays of every week---on that day, take 1 tablet.        ANTICOAG SUMMARY: Anticoagulation Episode Summary    Current INR goal 2.0-3.0  Next INR check 12/27/2015  INR from last check 2/60 (11/29/2015)  The INR is not numeric and cannot be flagged as normal/abnormal.  Weekly max dose   Target end date Indefinite  [facility]  Preferred lab   Send INR reminders to ANTICOAG IMP   Indications  Embolism and thrombosis of artery ([place]) [I74.9] Long term current use of anticoagulant [Z79.01]        Comments       Anticoagulation Care Providers    Provider Role Specialty Phone number   Michel Bickers, MD  Infectious Diseases [phone]      ANTICOAG TODAY: Anticoagulation Summary as of 11/29/2015    INR goal 2.0-3.0  Selected INR 2/60 (11/29/2015)  The INR is not numeric and cannot be flagged as normal/abnormal.  Next INR check 12/27/2015  Target end date Indefinite   Indications  Embolism and thrombosis of artery ([place]) [I74.9] Long term current use of anticoagulant [Z79.01]      Anticoagulation Episode Summary    [facility]    Preferred lab    Send INR reminders to ANTICOAG IMP   Comments     Anticoagulation Care Providers    Provider Role Specialty Phone number   Michel Bickers, MD  Infectious Diseases [phone]      PATIENT INSTRUCTIONS: Patient Instructions  Patient instructed to take medications as defined in the Anti-coagulation Track section of this encounter.  Patient instructed to take today's dose.  Patient verbalized understanding of these instructions.       FOLLOW-UP Return in 4 weeks (on 12/27/2015) for Follow up INR at 1015h.  Jorene Guest, [name] Pharm.D., CACP

## 2015-11-29 NOTE — Patient Instructions (Signed)
Patient instructed to take medications as defined in the Anti-coagulation Track section of this encounter.  Patient instructed to take today's dose.  Patient verbalized understanding of these instructions.    

## 2015-11-29 NOTE — Progress Notes (Signed)
Indication: History of deep venous thrombosis Duration: Chronic per patient's informed choice INR: At target. Dr. Groce's assessment and plan were reviewed and I agree with his documentation. 

## 2015-12-07 ENCOUNTER — Encounter (HOSPITAL_BASED_OUTPATIENT_CLINIC_OR_DEPARTMENT_OTHER): Payer: Medicare Other | Attending: Internal Medicine

## 2015-12-07 DIAGNOSIS — B20 Human immunodeficiency virus [HIV] disease: Secondary | ICD-10-CM | POA: Diagnosis not present

## 2015-12-07 DIAGNOSIS — Z86718 Personal history of other venous thrombosis and embolism: Secondary | ICD-10-CM | POA: Insufficient documentation

## 2015-12-07 DIAGNOSIS — L89221 Pressure ulcer of left hip, stage 1: Secondary | ICD-10-CM | POA: Insufficient documentation

## 2015-12-07 DIAGNOSIS — M1990 Unspecified osteoarthritis, unspecified site: Secondary | ICD-10-CM | POA: Insufficient documentation

## 2015-12-07 DIAGNOSIS — L89153 Pressure ulcer of sacral region, stage 3: Secondary | ICD-10-CM | POA: Diagnosis not present

## 2015-12-07 DIAGNOSIS — L89311 Pressure ulcer of right buttock, stage 1: Secondary | ICD-10-CM | POA: Diagnosis not present

## 2015-12-07 DIAGNOSIS — Z7901 Long term (current) use of anticoagulants: Secondary | ICD-10-CM | POA: Insufficient documentation

## 2015-12-08 DIAGNOSIS — B20 Human immunodeficiency virus [HIV] disease: Secondary | ICD-10-CM | POA: Diagnosis not present

## 2015-12-08 DIAGNOSIS — Z8701 Personal history of pneumonia (recurrent): Secondary | ICD-10-CM | POA: Diagnosis not present

## 2015-12-08 DIAGNOSIS — L89313 Pressure ulcer of right buttock, stage 3: Secondary | ICD-10-CM | POA: Diagnosis not present

## 2015-12-08 DIAGNOSIS — N189 Chronic kidney disease, unspecified: Secondary | ICD-10-CM | POA: Diagnosis not present

## 2015-12-14 ENCOUNTER — Other Ambulatory Visit: Payer: Self-pay | Admitting: Internal Medicine

## 2015-12-14 DIAGNOSIS — B20 Human immunodeficiency virus [HIV] disease: Secondary | ICD-10-CM

## 2015-12-17 ENCOUNTER — Ambulatory Visit (INDEPENDENT_AMBULATORY_CARE_PROVIDER_SITE_OTHER): Payer: Medicare Other | Admitting: Internal Medicine

## 2015-12-17 ENCOUNTER — Encounter: Payer: Self-pay | Admitting: Internal Medicine

## 2015-12-17 VITALS — BP 125/66 | HR 92 | Temp 98.2°F | Ht 71.0 in | Wt 126.3 lb

## 2015-12-17 DIAGNOSIS — L8990 Pressure ulcer of unspecified site, unspecified stage: Secondary | ICD-10-CM

## 2015-12-17 DIAGNOSIS — G546 Phantom limb syndrome with pain: Secondary | ICD-10-CM

## 2015-12-17 DIAGNOSIS — F17210 Nicotine dependence, cigarettes, uncomplicated: Secondary | ICD-10-CM

## 2015-12-17 DIAGNOSIS — L89159 Pressure ulcer of sacral region, unspecified stage: Secondary | ICD-10-CM | POA: Diagnosis not present

## 2015-12-17 DIAGNOSIS — Z79891 Long term (current) use of opiate analgesic: Secondary | ICD-10-CM | POA: Diagnosis not present

## 2015-12-17 MED ORDER — SENNOSIDES-DOCUSATE SODIUM 8.6-50 MG PO TABS: 2.0000 | ORAL_TABLET | Freq: Two times a day (BID) | ORAL | Status: AC

## 2015-12-17 NOTE — Assessment & Plan Note (Signed)
Patient has reported sacral ulcer, for which he is followed by wound care.  He did not want it examined today as it is a complicated and messy dressing.  He requested Ensure for the second time in a week from the clinic donation closet.  The importance of high calorie diet for wound healing in this patient is understandable.  The patient was provided with coupons for Ensure. - Continue wound care follow up.

## 2015-12-17 NOTE — Assessment & Plan Note (Signed)
Patient continues to have chronic pain, now unrelieved with recent increase to MS Contin 45 mg q8h and Oxycodone 5 mg q6h PRN for breakthrough pain.  He has been taking an extra MS Contin 30 mg to help with the pain.  Review of [place] Controlled Substances Database shows he is receiving his narcotic prescriptions from his ID office.  Therefore, we will not make changes to his regimen at this time.  Patient advised that if Oxy 5 is not relieving his pain, he may take Oxy 10 mg as needed, until he follows up with Dr. Megan Salon.  We will order UDS today to make sure the appropriate metabolites are seen in his urine. - UDS - Oxycodone 10 mg q6h PRN

## 2015-12-17 NOTE — Patient Instructions (Signed)
1. Continue MS Contin 45 mg every 8 hours as needed for pain. 2. Continue Oxycodone 5-10 mg every 6 hours as needed for breakthrough pain. 3. Follow up with Dr. Megan Salon.  Naloxone nasal spray What is this medicine? NALOXONE (nal OX one) is a narcotic blocker. It is used to treat narcotic drug overdose. This medicine may be used for other purposes; ask your health care provider or pharmacist if you have questions. What should I tell my health care provider before I take this medicine? They need to know if you have any of these conditions: -drug abuse or addiction -heart disease -an unusual or allergic reaction to naloxone, other medicines, foods, dyes, or preservatives -pregnant or trying to get pregnant -breast-feeding How should I use this medicine? This medicine is for use in the nose. Lay the person on their back. Support their neck with your hand and allow the head to tilt back before giving the medicine. The nasal spray should be given into 1 nostril. After giving the medicine, move the person onto their side. Do not remove or test the nasal spray until ready to use. Get emergency medical help right away after giving the first dose of this medicine, even if the person wakes up. You should be familiar with how to recognize the signs and symptoms of a narcotic overdose. If more doses are needed, give the additional dose in the other nostril. Talk to your pediatrician regarding the use of this medicine in children. While this drug may be prescribed for children as young as newborns for selected conditions, precautions do apply. Overdosage: If you think you have taken too much of this medicine contact a poison control center or emergency room at once. NOTE: This medicine is only for you. Do not share this medicine with others. What if I miss a dose? This does not apply. What may interact with this medicine? This medicine is only used during an emergency. No interactions are expected during  emergency use. This list may not describe all possible interactions. Give your health care provider a list of all the medicines, herbs, non-prescription drugs, or dietary supplements you use. Also tell them if you smoke, drink alcohol, or use illegal drugs. Some items may interact with your medicine. What should I watch for while using this medicine? Keep this medicine ready for use in the case of a narcotic overdose. Make sure that you have the phone number of your doctor or health care professional and local hospital ready. You may need to have additional doses of this medicine. Each nasal spray contains a single dose. Some emergencies may require additional doses. After use, bring the treated person to the nearest hospital or call 911. Make sure the treating health care professional knows that the person has received an injection of this medicine. You will receive additional instructions on what to do during and after use of this medicine before an emergency occurs. What side effects may I notice from receiving this medicine? Side effects that you should report to your doctor or health care professional as soon as possible: -allergic reactions like skin rash, itching or hives, swelling of the face, lips, or tongue -breathing problems -fast, irregular heartbeat -high blood pressure -pain that was controlled by narcotic pain medicine -seizures Side effects that usually do not require medical attention (report these to your doctor or health care professional if they continue or are bothersome): -anxious -chills -diarrhea -fever -headache -muscle pain -nausea, vomiting -nose irritation like dryness, congestion, and swelling -  sweating This list may not describe all possible side effects. Call your doctor for medical advice about side effects. You may report side effects to FDA at 1-800-FDA-1088. Where should I keep my medicine? Keep out of the reach of children. Store between 4 and 40 degrees  C (39 and 104 degrees F). Do not freeze. Throw away any unused medicine after the expiration date. Keep in original box until ready to use. NOTE: This sheet is a summary. It may not cover all possible information. If you have questions about this medicine, talk to your doctor, pharmacist, or health care provider.    2016, Elsevier/Gold Standard. (2014-10-02 14:24:03)

## 2015-12-17 NOTE — Progress Notes (Signed)
Internal Medicine Clinic Attending  Case discussed with Dr. Taylor at the time of the visit.  We reviewed the resident's history and exam and pertinent patient test results.  I agree with the assessment, diagnosis, and plan of care documented in the resident's note. 

## 2015-12-17 NOTE — Progress Notes (Signed)
Patient ID: [name], male   DOB: 23-Sep-1961, 55 y.o.   MRN: [id number]   Subjective:   Patient ID: [name] male   DOB: Nov 15, 1960 55 y.o.   MRN: [id number]  HPI: [name] is a 55 y.o. male with PMH as below, here for f/u phantom limb pain.  Please see Problem-Based charting for the status of the patient's chronic medical issues.  He states he continues to have chronic pain, unrelieved with recent changes in MS Contin 45 mg q8h and Oxycodone 5 mg PRN for breakthrough pain.  He has been taking an extra MS Contin 30 mg for breakthrough, in addition to Oxycodone 5 mg TID PRN.  He states the extra MS Contin 30 mg is from previous previous prescriptions that he still has.   He also has a sacral ulcer with dressing changes by wound care.  He does not want it examined as it is a complicated and messy dressing.  He wants Ensure from the donation closet.  He states he is unable to afford it on his own.  He has a BM ranging from daily to every 4-5 days.   Past Medical History  Diagnosis Date  . History of chicken pox   . Hyperlipidemia   . History of DVT of lower extremity     right  . Left hip pain 03/09/2013  . Pneumonia   . Chronic kidney disease   . Protein malnutrition ([place]) 08/2015  . HIV (human immunodeficiency virus infection) ([place])    Current Outpatient Prescriptions  Medication Sig Dispense Refill  . acetaminophen (TYLENOL) 500 MG tablet Take 1 tablet (500 mg total) by mouth every 4 (four) hours as needed for mild pain, moderate pain or headache. 30 tablet 0  . darunavir-cobicistat ([name]) 800-150 MG tablet Take 1 tablet by mouth daily. Swallow whole. Do NOT crush, break or chew tablets. Take with food. 30 tablet 5  . magnesium chloride (SLOW-MAG) 64 MG TBEC SR tablet Take 1 tablet (64 mg total) by mouth daily. 30 tablet 1  . morphine (MS CONTIN) 15 MG 12 hr tablet Take 1 tablet (15 mg total) by mouth every 8 (eight) hours. 90 tablet 0  . morphine (MS CONTIN) 30 MG 12  hr tablet Take 1 tablet (30 mg total) by mouth every 8 (eight) hours. 90 tablet 0  . Multiple Vitamin (MULTIVITAMIN WITH MINERALS) TABS tablet Take 1 tablet by mouth daily. 30 tablet 1  . oxyCODONE (OXY IR/ROXICODONE) 5 MG immediate release tablet Take 1 tablet (5 mg total) by mouth every 6 (six) hours as needed for severe pain. 30 tablet 0  . pantoprazole (PROTONIX) 40 MG tablet Take 1 tablet (40 mg total) by mouth daily. 90 tablet 1  . phosphorus (K PHOS NEUTRAL) 155-852-130 MG tablet Take 3 tablets (750 mg total) by mouth 2 (two) times daily. 180 tablet 0  . potassium chloride SA (K-DUR,KLOR-CON) 20 MEQ tablet TAKE 1 TABLET BY MOUTH ONCE DAILY 30 tablet 0  . potassium citrate (UROCIT-K) 10 MEQ (1080 MG) SR tablet Take 4 tablets (40 mEq total) by mouth 2 (two) times daily with a meal. 240 tablet 0  . SANTYL ointment APPLY TOPICALLY TO THE AFFECTED AREA(S) DAILY 30 g 0  . senna-docusate (SENOKOT-S) 8.6-50 MG tablet Take 2 tablets by mouth 2 (two) times daily. 120 tablet 3  . sodium bicarbonate 650 MG tablet Take 2 tablets (1,300 mg total) by mouth 3 (three) times daily. 180 tablet 0  . tenofovir ([name]) 300 MG  tablet TAKE 1 TABLET (300 MG TOTAL) BY MOUTH DAILY. 30 tablet 5  . [name] 50 MG tablet TAKE 1 TABLET BY MOUTH EVERY DAY 30 tablet 11  . warfarin (COUMADIN) 5 MG tablet Take 1 tablet (5 mg total) by mouth daily at 6 PM. 30 tablet 0   No current facility-administered medications for this visit.   Family History  Problem Relation Age of Onset  . Arthritis Mother   . Kidney disease Maternal Grandfather    Social History   Social History  . Marital Status: Married    Spouse Name: N/A  . Number of Children: 3  . Years of Education: N/A   Occupational History  .     Social History Main Topics  . Smoking status: Current Every Day Smoker -- 0.50 packs/day for 36 years    Types: Cigarettes  . Smokeless tobacco: Never Used     Comment: ABOUT 1/2PPD.  Slowing down  . Alcohol Use: No   . Drug Use: No  . Sexual Activity: Not Currently     Comment: declined condoms   Other Topics Concern  . None   Social History Narrative   Review of Systems: Pertinent items are noted in HPI. Objective:  Physical Exam: Filed Vitals:   12/17/15 1410  BP: 125/66  Pulse: 92  Temp: 98.2 F (36.8 C)  TempSrc: Oral  Height: 5\' 11"  (1.803 m)  Weight: 126 lb 4.8 oz (57.289 kg)  SpO2: 100%   Physical Exam  Constitutional: He is oriented to person, place, and time.  Thin, chronically ill appearing male, sitting in chair, wearing sunglasses, NAD  HENT:  Head: Normocephalic and atraumatic.  Neck: No tracheal deviation present.  Cardiovascular: Normal rate, regular rhythm and normal heart sounds.   Pulmonary/Chest: Effort normal and breath sounds normal. No stridor. No respiratory distress. He has no wheezes.  Musculoskeletal: He exhibits no edema.  R AKA  Neurological: He is alert and oriented to person, place, and time.  Skin: Skin is warm and dry.    Assessment & Plan:   Patient and case were discussed with Dr. Dareen Piano.  Please refer to Problem Based charting for further documentation.

## 2015-12-20 ENCOUNTER — Other Ambulatory Visit: Payer: Self-pay | Admitting: *Deleted

## 2015-12-20 ENCOUNTER — Telehealth: Payer: Self-pay | Admitting: *Deleted

## 2015-12-20 DIAGNOSIS — G8929 Other chronic pain: Secondary | ICD-10-CM

## 2015-12-20 DIAGNOSIS — G546 Phantom limb syndrome with pain: Secondary | ICD-10-CM

## 2015-12-20 MED ORDER — OXYCODONE HCL 5 MG PO TABS: 5.0000 mg | ORAL_TABLET | Freq: Two times a day (BID) | ORAL | Status: DC | PRN

## 2015-12-20 MED ORDER — MORPHINE SULFATE ER 30 MG PO TBCR: 60.0000 mg | EXTENDED_RELEASE_TABLET | Freq: Three times a day (TID) | ORAL | Status: DC

## 2015-12-20 NOTE — Telephone Encounter (Signed)
I think it's reasonable to increase the schedule MS Contin to 60mg  q8 at this point and use the oxycodone 5mg  BID PRN for breakthrough. Is that ok Dr. Megan Salon?

## 2015-12-20 NOTE — Telephone Encounter (Signed)
I printed new prescriptions for MS Contin 30mg  #180, take 2 tablets every 8 hours; Oxy IR 5mg  #60 take 1 tablet twice daily as needed.  Dr. Johnnye Sima agreed to sign, as patient will need to pick up prescriptions today to avoid a lapse. [name], RN

## 2015-12-20 NOTE — Telephone Encounter (Signed)
Patient has been on his new, reduced, pain management for 1 month.  He feels that he needs the 5 mg Oxy IR at least twice daily for breakthrough pain, that the 45 mg MS Contin does not hold down his pain for 8 hours. He is asking for 60-90 of the 5 mg Oxycodone.  Please advise.  Patient last filled 11/16/15, needs to pick up 12/21/15. [name], RN

## 2015-12-20 NOTE — Telephone Encounter (Signed)
I agree

## 2015-12-21 DIAGNOSIS — L89153 Pressure ulcer of sacral region, stage 3: Secondary | ICD-10-CM | POA: Diagnosis not present

## 2015-12-21 DIAGNOSIS — L89321 Pressure ulcer of left buttock, stage 1: Secondary | ICD-10-CM | POA: Diagnosis not present

## 2015-12-21 DIAGNOSIS — L89311 Pressure ulcer of right buttock, stage 1: Secondary | ICD-10-CM | POA: Diagnosis not present

## 2015-12-21 DIAGNOSIS — Z86718 Personal history of other venous thrombosis and embolism: Secondary | ICD-10-CM | POA: Diagnosis not present

## 2015-12-21 DIAGNOSIS — M1990 Unspecified osteoarthritis, unspecified site: Secondary | ICD-10-CM | POA: Diagnosis not present

## 2015-12-21 DIAGNOSIS — L89221 Pressure ulcer of left hip, stage 1: Secondary | ICD-10-CM | POA: Diagnosis not present

## 2015-12-21 DIAGNOSIS — B20 Human immunodeficiency virus [HIV] disease: Secondary | ICD-10-CM | POA: Diagnosis not present

## 2015-12-25 LAB — TOXASSURE SELECT,+ANTIDEPR,UR: PDF: 0

## 2015-12-27 ENCOUNTER — Ambulatory Visit (INDEPENDENT_AMBULATORY_CARE_PROVIDER_SITE_OTHER): Payer: Medicare Other | Admitting: Pharmacist

## 2015-12-27 DIAGNOSIS — Z7901 Long term (current) use of anticoagulants: Secondary | ICD-10-CM

## 2015-12-27 DIAGNOSIS — I749 Embolism and thrombosis of unspecified artery: Secondary | ICD-10-CM | POA: Diagnosis not present

## 2015-12-27 LAB — POCT INR: INR: 2.5

## 2015-12-27 NOTE — Progress Notes (Signed)
Anti-Coagulation Progress Note  [name] is a 56 y.o. male who is currently on an anti-coagulation regimen.    RECENT RESULTS: Recent results are below, the most recent result is correlated with a dose of 32.5 mg. per week: Lab Results  Component Value Date   INR 2.50 12/27/2015   INR 2/60 11/29/2015   INR 2.60 11/19/2015   PROTIME 38.4* 04/01/2012    ANTI-COAG DOSE: Anticoagulation Dose Instructions as of 12/27/2015      [name] Tue Wed Thu Fri Sat   New Dose 5 mg 5 mg 5 mg 5 mg 5 mg 2.5 mg 5 mg    Description        Continue taking 1 tablet of warfarin 5mg  on all days of week except on Fridays--then, take only 1/2 tablet each Friday.        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] Long term current use of anticoagulant [Z79.01]        Comments       Anticoagulation Care Providers    Provider Role Specialty Phone number   Michel Bickers, MD  Infectious Diseases [phone]      ANTICOAG TODAY: Anticoagulation Summary as of 12/27/2015    INR goal 2.0-3.0  Selected INR 2.50 (12/27/2015)  Next INR check 01/24/2016  Target end date Indefinite   Indications  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  Patient instructed to take today's dose.  Patient verbalized understanding of these instructions.       FOLLOW-UP Return in 4 weeks (on 01/24/2016) for Follow up INR at 1000h.  Jorene Guest, [name] Pharm.D., CACP

## 2015-12-27 NOTE — Patient Instructions (Signed)
Patient instructed to take medications as defined in the Anti-coagulation Track section of this encounter.  Patient instructed to take today's dose.  Patient verbalized understanding of these instructions.    

## 2015-12-29 ENCOUNTER — Telehealth: Payer: Self-pay | Admitting: *Deleted

## 2015-12-29 ENCOUNTER — Other Ambulatory Visit: Payer: Self-pay | Admitting: Internal Medicine

## 2015-12-29 DIAGNOSIS — G629 Polyneuropathy, unspecified: Secondary | ICD-10-CM

## 2015-12-29 NOTE — Telephone Encounter (Signed)
Patient requesting refill of gabapentin 400mg  to take 4 times a day. This was discontinued on his medication list after discharge on 11/30.  Per [facility], he has not filled this since 07/2015, but this was a patient-generated refill request. Please advise. [name], RN

## 2015-12-29 NOTE — Telephone Encounter (Signed)
I sent a 6 month supply.  Thanks!

## 2015-12-29 NOTE — Telephone Encounter (Signed)
He may have enough refills to last to his next appointment with me.

## 2015-12-31 ENCOUNTER — Encounter: Payer: Self-pay | Admitting: Internal Medicine

## 2015-12-31 ENCOUNTER — Ambulatory Visit (INDEPENDENT_AMBULATORY_CARE_PROVIDER_SITE_OTHER): Payer: Medicare Other | Admitting: Internal Medicine

## 2015-12-31 VITALS — BP 125/74 | HR 100 | Temp 97.9°F | Ht 71.0 in | Wt 122.1 lb

## 2015-12-31 DIAGNOSIS — G8929 Other chronic pain: Secondary | ICD-10-CM | POA: Diagnosis not present

## 2015-12-31 DIAGNOSIS — Z79891 Long term (current) use of opiate analgesic: Secondary | ICD-10-CM | POA: Diagnosis not present

## 2015-12-31 DIAGNOSIS — G546 Phantom limb syndrome with pain: Secondary | ICD-10-CM | POA: Diagnosis not present

## 2015-12-31 DIAGNOSIS — Z89611 Acquired absence of right leg above knee: Secondary | ICD-10-CM | POA: Diagnosis not present

## 2015-12-31 DIAGNOSIS — L8990 Pressure ulcer of unspecified site, unspecified stage: Secondary | ICD-10-CM

## 2015-12-31 DIAGNOSIS — F17210 Nicotine dependence, cigarettes, uncomplicated: Secondary | ICD-10-CM

## 2015-12-31 DIAGNOSIS — L89153 Pressure ulcer of sacral region, stage 3: Secondary | ICD-10-CM

## 2015-12-31 NOTE — Assessment & Plan Note (Signed)
His pain regimen has been changed again to help with pain relief.  He now takes MS Contin 60 mg BID and he adds his PRN OxyIR 5 mg at the same time in order to get adequate pain relief.  He states he is doing ok at this dose, though it is still too low.  A/P: Chronic pain. Will continue patient on his current regimen as it is managed with ID.  Will continue to follow patient. - CTM

## 2015-12-31 NOTE — Assessment & Plan Note (Signed)
A/P: Stage 3 Decubitus ulcer.  Patient is receiving wound care bandage changes and healthy looking surrounding granulation tissue.  He would benefit from power wheelchair with tilt function in addition to increased protein intake to help with wound healing.  Patient has been dealing with chronic phantom pain for a long time and he had a significant set back in rehab with his recent admission.   I have reviewed and agree with the PT evaluation.  Due to patient's ongoing chronic pain and AKA, he is unable to walk with cane or walker.  His true gait cannot even be assessed as the patient cannot standup without assistance.  PT ruled out manual wheelchairs and recommends power wheel chair.  Scooter is not suitable in patient's home environment due to large turning radius, as well as transferring in/out of scooter.  I believe tilt function is required for proper wound healing in order to provide pressure relief of the decubitus ulcer.

## 2015-12-31 NOTE — Progress Notes (Signed)
Medicine attending: Medical history, presenting problems, physical findings, and medications, reviewed with resident physician Dr Nicholas Taylor on the day of the patient visit and I concur with his evaluation and management plan. 

## 2015-12-31 NOTE — Patient Instructions (Signed)
1. Continue your pain medications as prescribed.  Do not take extra doses.  Pressure Injury A pressure injury, sometimes called a bedsore, is an injury to the skin and underlying tissue caused by pressure. Pressure on blood vessels causes decreased blood flow to the skin, which can eventually cause the skin tissue to die and break down into a wound. Pressure injuries usually occur:  Over bony parts of the body such as the tailbone, shoulders, elbows, hips, and heels.  Under medical devices such as respiratory equipment, stockings, tubes, and splints. Pressure injuries start as reddened areas on the skin and can lead to pain, muscle damage, and infection. Pressure injuries can vary in severity.  CAUSES Pressure injuries are caused by a lack of blood supply to an area of skin. They can occur from intense pressure over a short period of time or from less intense pressure over a long period of time. RISK FACTORS This condition is more likely to develop in people who:  Are in the hospital or an extended care facility.  Are bedridden or in a wheelchair.  Have an injury or disease that keeps them from:  Moving normally.  Feeling pain or pressure.  Have a condition that:  Makes them sleepy or less alert.  Causes poor blood flow.  Need to wear a medical device.  Have poor control of their bladder or bowel functions (incontinence).  Have poor nutrition (malnutrition).  Are of certain ethnicities. People of African American and Latino or Hispanic descent are at higher risk compared to other ethnic groups. If you are at risk for pressure ulcers, your health care provider may recommend certain types of bedding to help prevent them. These may include foam or gel mattresses covered with one of the following:  A sheepskin blanket.  A pad that is filled with gel, air, water, or foam. SYMPTOMS  The main symptom is a blister or change in skin color that opens into a wound. Other symptoms  include:   Red or dark areas of skin that do not turn white or pale when pressed with a finger.   Pain, warmth, or change of skin texture.  DIAGNOSIS This condition is diagnosed with a medical history and physical exam. You may also have tests, including:   Blood tests to check for infection or signs of poor nutrition.  Imaging studies to check for damage to the deep tissues under your skin.  Blood flow studies. Your pressure injury will be staged to determine its severity. Staging is an assessment of:  The depth of the pressure injury.  Which tissues are exposed because of the pressure injury.  The causes of the pressure injury. TREATMENT The main focus of treatment is to help your injury heal. This may be done by:   Relieving or redistributing pressure on your skin. This includes:  Frequently changing your position.  Eliminating or minimizing positions that caused the wound or that can make the wound worse.  Using specific bed mattresses and chair cushions.  Refitting, resizing, or replacing any medical devices, or padding the skin under them.  Using creams or powders to prevent rubbing (friction) on the skin.  Keeping your skin clean and dry. This may include using a skin cleanser or skin protectant as told by your health care provider. This may be a lotion, ointment, or spray.  Cleaning your injury and removing any dead tissue from the wound (debridement).  Placing a bandage (dressing) over your injury.  Preventing or treating infection. This  may include antibiotic, antimicrobial, or antiseptic medicines. Treatment may also include medicine for pain. Sometimes surgery is needed to close the wound with a flap of healthy skin or a piece of skin from another area of your body (graft). You may need surgery if other treatments are not working or if your injury is very deep. HOME CARE INSTRUCTIONS Wound Care  Follow instructions from your health care provider  about:  How to take care of your wound.  When and how you should change your dressing.  When you should remove your dressing. If your dressing is dry and stuck when you try to remove it, moisten or wet the dressing with saline or water so that it can be removed without harming your skin or wound tissue.  Check your wound every day for signs of infection. Have a caregiver do this for you if you are not able. Watch for:  More redness, swelling, or pain.  More fluid, blood, or pus.  A bad smell. Skin Care  Keep your skin clean and dry. Gently pat your skin dry.  Do not rub or massage your skin.  Use a skin protectant only as told by your health care provider.  Check your skin every day for any changes in color or any new blisters or sores (ulcers). Have a caregiver do this for you if you are not able. Medicines  Take over-the-counter and prescription medicines only as told by your health care provider.  If you were prescribed an antibiotic medicine, take it or apply it as told by your health care provider. Do not stop taking or using the antibiotic even if your condition improves. Reducing and Redistributing Pressure  Do not lie or sit in one position for a long time. Move or change position every two hours or as told by your health care provider.  Use pillows or cushions to reduce pressure. Ask your health care provider to recommend cushions or pads for you.  Use medical devices that do not rub your skin. Tell your health care provider if one of your medical devices is causing a pressure injury to develop. General Instructions  Eat a healthy diet that includes lots of protein. Ask your health care provider for diet advice.  Drink enough fluid to keep your urine clear or pale yellow.  Be as active as you can every day. Ask your health care provider to suggest safe exercises or activities.  Do not abuse drugs or alcohol.  Keep all follow-up visits as told by your health  care provider. This is important.  Do not smoke. SEEK MEDICAL CARE IF:  You have chills or fever.  Your pain medicine is not helping.  You have any changes in skin color.  You have new blisters or sores.  You develop warmth, redness, or swelling near a pressure injury.  You have a bad odor or pus coming from your pressure injury.  You lose control of your bowels or bladder.  You develop new symptoms.  Your wound does not improve after 1-2 weeks of treatment.  You develop a new medical condition, such as diabetes, peripheral vascular disease, or conditions that affect your defense (immune) system.   This information is not intended to replace advice given to you by your health care provider. Make sure you discuss any questions you have with your health care provider.   Document Released: 10/16/2005 Document Revised: 07/07/2015 Document Reviewed: 02/24/2015 Elsevier Interactive Patient Education [employer].

## 2015-12-31 NOTE — Progress Notes (Signed)
Patient ID: [name], male   DOB: 12/31/1960, 55 y.o.   MRN: [id number]    Subjective:   Patient ID: [name] male   DOB: 08/04/1961 55 y.o.   MRN: [id number]  HPI: [name] is a 55 y.o. male with PMH as below, here for evaluation of stage 3 decubitus ulcer and evaluation for power wheel chair.  Please see Problem-Based charting for the status of the patient's chronic medical issues.  Patient has chronic pain 2/2 AKA and prosthesis.  In addition, he has phantom limb pain.  His pain has been difficult to control, takign MS Contin 60 mg BID, OxyIR 5 mg BID, and Tylenol 500 mg q4h.  He was recently hospitalized, and has since been too weak to walk or even lift his prosthesis.  At best, he can maneuver 20 ft, but even this is unsteady and difficult.  He also developed a stage 3 decubitus ulcer during his hospitalization.  Wound care nurse changes the bandage once a week.  His daughter changes it about every other day.  He has been drinking Ensure to help gain weight and promote wound healing.  He denies fever, chills, or weight loss.  He has considerable back pain when straining or sitting too long.    Past Medical History  Diagnosis Date  . History of chicken pox   . Hyperlipidemia   . History of DVT of lower extremity     right  . Left hip pain 03/09/2013  . Pneumonia   . Chronic kidney disease   . Protein malnutrition ([place]) 08/2015  . HIV (human immunodeficiency virus infection) ([place])    Current Outpatient Prescriptions  Medication Sig Dispense Refill  . acetaminophen (TYLENOL) 500 MG tablet Take 1 tablet (500 mg total) by mouth every 4 (four) hours as needed for mild pain, moderate pain or headache. 30 tablet 0  . darunavir-cobicistat ([name]) 800-150 MG tablet Take 1 tablet by mouth daily. Swallow whole. Do NOT crush, break or chew tablets. Take with food. 30 tablet 5  . gabapentin (NEURONTIN) 400 MG capsule TAKE 1 CAPSULE BY MOUTH FOUR TIMES DAILY 360 capsule 1  .  magnesium chloride (SLOW-MAG) 64 MG TBEC SR tablet Take 1 tablet (64 mg total) by mouth daily. 30 tablet 1  . morphine (MS CONTIN) 30 MG 12 hr tablet Take 2 tablets (60 mg total) by mouth every 8 (eight) hours. 180 tablet 0  . Multiple Vitamin (MULTIVITAMIN WITH MINERALS) TABS tablet Take 1 tablet by mouth daily. 30 tablet 1  . oxyCODONE (OXY IR/ROXICODONE) 5 MG immediate release tablet Take 1 tablet (5 mg total) by mouth 2 (two) times daily as needed for severe pain. 60 tablet 0  . pantoprazole (PROTONIX) 40 MG tablet Take 1 tablet (40 mg total) by mouth daily. 90 tablet 1  . phosphorus (K PHOS NEUTRAL) 155-852-130 MG tablet Take 3 tablets (750 mg total) by mouth 2 (two) times daily. 180 tablet 0  . potassium chloride SA (K-DUR,KLOR-CON) 20 MEQ tablet TAKE 1 TABLET BY MOUTH ONCE DAILY 30 tablet 0  . potassium citrate (UROCIT-K) 10 MEQ (1080 MG) SR tablet Take 4 tablets (40 mEq total) by mouth 2 (two) times daily with a meal. 240 tablet 0  . SANTYL ointment APPLY TOPICALLY TO THE AFFECTED AREA(S) DAILY 30 g 0  . senna-docusate (SENOKOT-S) 8.6-50 MG tablet Take 2 tablets by mouth 2 (two) times daily. 120 tablet 3  . sodium bicarbonate 650 MG tablet Take 2 tablets (1,300 mg total) by  mouth 3 (three) times daily. 180 tablet 0  . tenofovir ([name]) 300 MG tablet TAKE 1 TABLET (300 MG TOTAL) BY MOUTH DAILY. 30 tablet 5  . [name] 50 MG tablet TAKE 1 TABLET BY MOUTH EVERY DAY 30 tablet 11  . warfarin (COUMADIN) 5 MG tablet Take 1 tablet (5 mg total) by mouth daily at 6 PM. 30 tablet 0   No current facility-administered medications for this visit.   Family History  Problem Relation Age of Onset  . Arthritis Mother   . Kidney disease Maternal Grandfather    Social History   Social History  . Marital Status: Married    Spouse Name: N/A  . Number of Children: 3  . Years of Education: N/A   Occupational History  .     Social History Main Topics  . Smoking status: Current Every Day Smoker --  0.50 packs/day for 36 years    Types: Cigarettes  . Smokeless tobacco: Never Used     Comment: ABOUT 1/2PPD.  Slowing down  . Alcohol Use: No  . Drug Use: No  . Sexual Activity: Not Currently     Comment: declined condoms   Other Topics Concern  . None   Social History Narrative   Review of Systems: Pertinent items are noted in HPI. Objective:  Physical Exam: Filed Vitals:   12/31/15 1525  BP: 125/74  Pulse: 100  Temp: 97.9 F (36.6 C)  TempSrc: Oral  Height: 5\' 11"  (1.803 m)  Weight: 122 lb 1.6 oz (55.384 kg)  SpO2: 100%   Physical Exam  Constitutional: He is oriented to person, place, and time.  Frail, chronically ill appearing male, sitting in chair, NAD.  HENT:  Head: Normocephalic and atraumatic.  Eyes: EOM are normal. No scleral icterus.  Neck: No tracheal deviation present.  Cardiovascular: Regular rhythm and normal heart sounds.   Tachycardic after being helped from exam table back to chair.  Pulmonary/Chest: Effort normal and breath sounds normal. No stridor. No respiratory distress. He has no wheezes.  Musculoskeletal:  Right AKA with prosthesis.  Strength 4-/5 on left and 3+/5 on right to hip flexion.  Knee extension 5/5 on left and unable to assess on right due to AKA.  Neurological: He is alert and oriented to person, place, and time.  Skin: Skin is warm and dry.  Presacral ulcer, 3-4 cm with visible underlying muscle and minimal purulent material on bandage.  Ulcer with circumferential granulation tissue.     Assessment & Plan:   Patient and case were discussed with Dr. Beryle Beams.  Please refer to Problem Based charting for further documentation.  Patient has been dealing with chronic phantom pain for a long time and he had a significant set back in rehab with his recent admission.   I have reviewed and agree with the PT evaluation.  Due to patient's ongoing chronic pain and AKA, he is unable to walk with cane or walker.  His true gait cannot even be  assessed as the patient cannot standup without assistance.  PT ruled out manual wheelchairs and recommends power wheel chair.  Scooter is not suitable in patient's home environment due to large turning radius, as well as transferring in/out of scooter.  I believe tilt function is required for proper wound healing in order to provide pressure relief of the decubitus ulcer.

## 2016-01-04 ENCOUNTER — Encounter (HOSPITAL_BASED_OUTPATIENT_CLINIC_OR_DEPARTMENT_OTHER): Payer: Medicare Other | Attending: Surgery

## 2016-01-04 DIAGNOSIS — Z86718 Personal history of other venous thrombosis and embolism: Secondary | ICD-10-CM | POA: Diagnosis not present

## 2016-01-04 DIAGNOSIS — Z7901 Long term (current) use of anticoagulants: Secondary | ICD-10-CM | POA: Insufficient documentation

## 2016-01-04 DIAGNOSIS — L89153 Pressure ulcer of sacral region, stage 3: Secondary | ICD-10-CM | POA: Insufficient documentation

## 2016-01-04 DIAGNOSIS — L97821 Non-pressure chronic ulcer of other part of left lower leg limited to breakdown of skin: Secondary | ICD-10-CM | POA: Diagnosis not present

## 2016-01-04 DIAGNOSIS — M1990 Unspecified osteoarthritis, unspecified site: Secondary | ICD-10-CM | POA: Diagnosis not present

## 2016-01-04 DIAGNOSIS — B20 Human immunodeficiency virus [HIV] disease: Secondary | ICD-10-CM | POA: Diagnosis not present

## 2016-01-04 DIAGNOSIS — L89321 Pressure ulcer of left buttock, stage 1: Secondary | ICD-10-CM | POA: Diagnosis not present

## 2016-01-10 ENCOUNTER — Other Ambulatory Visit: Payer: Self-pay | Admitting: Internal Medicine

## 2016-01-18 ENCOUNTER — Telehealth: Payer: Self-pay | Admitting: *Deleted

## 2016-01-18 ENCOUNTER — Other Ambulatory Visit: Payer: Self-pay | Admitting: *Deleted

## 2016-01-18 DIAGNOSIS — Z7901 Long term (current) use of anticoagulants: Secondary | ICD-10-CM | POA: Diagnosis not present

## 2016-01-18 DIAGNOSIS — L89321 Pressure ulcer of left buttock, stage 1: Secondary | ICD-10-CM | POA: Diagnosis not present

## 2016-01-18 DIAGNOSIS — L89153 Pressure ulcer of sacral region, stage 3: Secondary | ICD-10-CM | POA: Diagnosis not present

## 2016-01-18 DIAGNOSIS — L97821 Non-pressure chronic ulcer of other part of left lower leg limited to breakdown of skin: Secondary | ICD-10-CM | POA: Diagnosis not present

## 2016-01-18 DIAGNOSIS — M1990 Unspecified osteoarthritis, unspecified site: Secondary | ICD-10-CM | POA: Diagnosis not present

## 2016-01-18 DIAGNOSIS — Z86718 Personal history of other venous thrombosis and embolism: Secondary | ICD-10-CM | POA: Diagnosis not present

## 2016-01-18 DIAGNOSIS — B20 Human immunodeficiency virus [HIV] disease: Secondary | ICD-10-CM | POA: Diagnosis not present

## 2016-01-18 DIAGNOSIS — G8929 Other chronic pain: Secondary | ICD-10-CM

## 2016-01-18 DIAGNOSIS — G546 Phantom limb syndrome with pain: Secondary | ICD-10-CM

## 2016-01-18 MED ORDER — OXYCODONE HCL 5 MG PO TABS: 5.0000 mg | ORAL_TABLET | Freq: Two times a day (BID) | ORAL | Status: DC | PRN

## 2016-01-18 MED ORDER — MORPHINE SULFATE ER 30 MG PO TBCR: 60.0000 mg | EXTENDED_RELEASE_TABLET | Freq: Three times a day (TID) | ORAL | Status: DC

## 2016-01-19 MED ORDER — MORPHINE SULFATE ER 60 MG PO TBCR: 60.0000 mg | EXTENDED_RELEASE_TABLET | Freq: Three times a day (TID) | ORAL | Status: DC

## 2016-01-19 NOTE — Addendum Note (Signed)
Addended by: Michel Bickers on: 01/19/2016 03:26 PM   Modules accepted: Orders, Medications

## 2016-01-19 NOTE — Telephone Encounter (Addendum)
Received fax requesting prior authorization of patient's pain medication MS contin 30 mg q 8 hrs #180.  This is due to the number of pills dispensed per month (#180).  Per [facility], they were able to override this and it was picked up without issue.  [facility] suggested rewriting the prescription for 60 mg rather than 30 mg to reduce the number of pills dispensed.   [name], RN

## 2016-01-19 NOTE — Telephone Encounter (Signed)
I changed his MS Contin prescription to 160 mg tablet every 8 hours, #90 with no refills.

## 2016-01-24 ENCOUNTER — Ambulatory Visit (INDEPENDENT_AMBULATORY_CARE_PROVIDER_SITE_OTHER): Payer: Medicare Other | Admitting: Pharmacist

## 2016-01-24 DIAGNOSIS — I749 Embolism and thrombosis of unspecified artery: Secondary | ICD-10-CM | POA: Diagnosis not present

## 2016-01-24 DIAGNOSIS — Z7901 Long term (current) use of anticoagulants: Secondary | ICD-10-CM | POA: Diagnosis not present

## 2016-01-24 LAB — POCT INR: INR: 2.8

## 2016-01-24 NOTE — Progress Notes (Signed)
Anti-Coagulation Progress Note  Vard Wayner is a 55 y.o. male who is currently on an anti-coagulation regimen.    RECENT RESULTS: Recent results are below, the most recent result is correlated with a dose of 32.5 mg. per week: Lab Results  Component Value Date   INR 2.80 01/24/2016   INR 2.50 12/27/2015   INR 2/60 11/29/2015   PROTIME 38.4* 04/01/2012    ANTI-COAG DOSE: Anticoagulation Dose Instructions as of 01/24/2016      [name] Tue Wed Thu Fri Sat   New Dose 5 mg 2.5 mg 5 mg 5 mg 2.5 mg 5 mg 5 mg    Description        Continue taking 1 tablet of warfarin 5mg  on all days of week except on Mondays and Thursdays--then, take only 1/2 tablet each Monday and Thursday.        ANTICOAG SUMMARY: Anticoagulation Episode Summary    Current INR goal 2.0-3.0  Next INR check 02/21/2016  INR from last check 2.80 (01/24/2016)  Weekly max dose   Target end date Indefinite  [facility]  Preferred lab   Send INR reminders to ANTICOAG IMP   Indications  Embolism and thrombosis of artery ([place]) [I74.9] Long term current use of anticoagulant [Z79.01]        Comments       Anticoagulation Care Providers    Provider Role Specialty Phone number   Michel Bickers, MD  Infectious Diseases [phone]      ANTICOAG TODAY: Anticoagulation Summary as of 01/24/2016    INR goal 2.0-3.0  Selected INR 2.80 (01/24/2016)  Next INR check 02/21/2016  Target end date Indefinite   Indications  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  Patient instructed to take today's dose.  Patient verbalized understanding of these instructions.       FOLLOW-UP Return in 4 weeks (on 02/21/2016) for Follow up INR at 1015h.  Jorene Guest, [name] Pharm.D., CACP

## 2016-01-24 NOTE — Patient Instructions (Signed)
Patient instructed to take medications as defined in the Anti-coagulation Track section of this encounter.  Patient instructed to take today's dose.  Patient verbalized understanding of these instructions.    

## 2016-01-26 NOTE — Progress Notes (Signed)
INTERNAL MEDICINE TEACHING ATTENDING ADDENDUM - Aldine Contes M.D  Duration- indefinite, Indication- arterial thromboembolism, INR- therapeutic. Agree with pharmacy recommendations as outlined in their note.

## 2016-02-01 ENCOUNTER — Encounter (HOSPITAL_BASED_OUTPATIENT_CLINIC_OR_DEPARTMENT_OTHER): Payer: Medicare Other | Attending: Surgery

## 2016-02-01 ENCOUNTER — Encounter (HOSPITAL_BASED_OUTPATIENT_CLINIC_OR_DEPARTMENT_OTHER): Payer: Medicare Other

## 2016-02-01 DIAGNOSIS — L89153 Pressure ulcer of sacral region, stage 3: Secondary | ICD-10-CM | POA: Diagnosis not present

## 2016-02-01 DIAGNOSIS — L89321 Pressure ulcer of left buttock, stage 1: Secondary | ICD-10-CM | POA: Diagnosis not present

## 2016-02-01 DIAGNOSIS — L97821 Non-pressure chronic ulcer of other part of left lower leg limited to breakdown of skin: Secondary | ICD-10-CM | POA: Diagnosis not present

## 2016-02-01 DIAGNOSIS — Z21 Asymptomatic human immunodeficiency virus [HIV] infection status: Secondary | ICD-10-CM | POA: Diagnosis not present

## 2016-02-01 DIAGNOSIS — Z86718 Personal history of other venous thrombosis and embolism: Secondary | ICD-10-CM | POA: Insufficient documentation

## 2016-02-09 ENCOUNTER — Other Ambulatory Visit: Payer: Self-pay | Admitting: Internal Medicine

## 2016-02-15 DIAGNOSIS — Z21 Asymptomatic human immunodeficiency virus [HIV] infection status: Secondary | ICD-10-CM | POA: Diagnosis not present

## 2016-02-15 DIAGNOSIS — L89153 Pressure ulcer of sacral region, stage 3: Secondary | ICD-10-CM | POA: Diagnosis not present

## 2016-02-15 DIAGNOSIS — L89321 Pressure ulcer of left buttock, stage 1: Secondary | ICD-10-CM | POA: Diagnosis not present

## 2016-02-15 DIAGNOSIS — L97821 Non-pressure chronic ulcer of other part of left lower leg limited to breakdown of skin: Secondary | ICD-10-CM | POA: Diagnosis not present

## 2016-02-15 DIAGNOSIS — Z86718 Personal history of other venous thrombosis and embolism: Secondary | ICD-10-CM | POA: Diagnosis not present

## 2016-02-17 ENCOUNTER — Other Ambulatory Visit: Payer: Self-pay | Admitting: *Deleted

## 2016-02-17 ENCOUNTER — Telehealth: Payer: Self-pay | Admitting: *Deleted

## 2016-02-17 DIAGNOSIS — G8929 Other chronic pain: Secondary | ICD-10-CM

## 2016-02-17 DIAGNOSIS — R634 Abnormal weight loss: Secondary | ICD-10-CM

## 2016-02-17 DIAGNOSIS — G546 Phantom limb syndrome with pain: Secondary | ICD-10-CM

## 2016-02-17 MED ORDER — OXYCODONE HCL 5 MG PO TABS: 5.0000 mg | ORAL_TABLET | Freq: Two times a day (BID) | ORAL | Status: DC | PRN

## 2016-02-17 MED ORDER — ENSURE PO LIQD: 1.0000 | Freq: Three times a day (TID) | ORAL | Status: DC

## 2016-02-17 MED ORDER — MORPHINE SULFATE ER 60 MG PO TBCR: 60.0000 mg | EXTENDED_RELEASE_TABLET | Freq: Three times a day (TID) | ORAL | Status: DC

## 2016-02-17 NOTE — Telephone Encounter (Signed)
-----   Message from [name], RN sent at 02/16/2016  3:20 PM EDT ----- Regarding: med refills Patient called for pain refills (now correct per his insurance, also for ensure rx to be faxed to thp - can you do that for me this afternoon pretty pretty please? [facility]!!! >=) michelle

## 2016-02-17 NOTE — Telephone Encounter (Signed)
Pain Rx done but no ensure on his profile so did not print the Rx

## 2016-02-21 ENCOUNTER — Ambulatory Visit (INDEPENDENT_AMBULATORY_CARE_PROVIDER_SITE_OTHER): Payer: Medicare Other | Admitting: Pharmacist

## 2016-02-21 DIAGNOSIS — I749 Embolism and thrombosis of unspecified artery: Secondary | ICD-10-CM | POA: Diagnosis not present

## 2016-02-21 DIAGNOSIS — Z7901 Long term (current) use of anticoagulants: Secondary | ICD-10-CM

## 2016-02-21 LAB — POCT INR: INR: 3

## 2016-02-21 NOTE — Progress Notes (Signed)
Anti-Coagulation Progress Note  Sabastion Dean is a 55 y.o. male who is currently on an anti-coagulation regimen.    RECENT RESULTS: Recent results are below, the most recent result is correlated with a dose of 30 mg. 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[place]) [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.9] Long term current use of anticoagulant [Z79.01]        Comments       Anticoagulation Care Providers    Provider Role Specialty Phone number   Michel Bickers, MD  Infectious Diseases [phone]      ANTICOAG TODAY: Anticoagulation Summary as of 02/21/2016    INR goal 2.0-3.0  Selected INR 3.00 (02/21/2016)  Next INR check 03/20/2016  Target end date Indefinite   Indications  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  Patient instructed to take today's dose.  Patient verbalized understanding of these instructions.       FOLLOW-UP Return  in 4 weeks (on 03/20/2016) for Follow up INR at 1100h.  Jorene Guest, [name] Pharm.D., CACP

## 2016-02-21 NOTE — Patient Instructions (Signed)
Patient instructed to take medications as defined in the Anti-coagulation Track section of this encounter.  Patient instructed to take today's dose.  Patient verbalized understanding of these instructions.    

## 2016-02-21 NOTE — Progress Notes (Signed)
Indication: Arterial thrombosis. Duration: Indefinite. INR: At target. Agree with Dr. Gladstone Pih assessment and plan.

## 2016-02-29 ENCOUNTER — Encounter (HOSPITAL_BASED_OUTPATIENT_CLINIC_OR_DEPARTMENT_OTHER): Payer: Medicare Other | Attending: Surgery

## 2016-02-29 DIAGNOSIS — B20 Human immunodeficiency virus [HIV] disease: Secondary | ICD-10-CM | POA: Insufficient documentation

## 2016-02-29 DIAGNOSIS — Z7901 Long term (current) use of anticoagulants: Secondary | ICD-10-CM | POA: Insufficient documentation

## 2016-02-29 DIAGNOSIS — Z86718 Personal history of other venous thrombosis and embolism: Secondary | ICD-10-CM | POA: Insufficient documentation

## 2016-02-29 DIAGNOSIS — M1990 Unspecified osteoarthritis, unspecified site: Secondary | ICD-10-CM | POA: Diagnosis not present

## 2016-02-29 DIAGNOSIS — L89153 Pressure ulcer of sacral region, stage 3: Secondary | ICD-10-CM | POA: Diagnosis not present

## 2016-03-14 DIAGNOSIS — B20 Human immunodeficiency virus [HIV] disease: Secondary | ICD-10-CM | POA: Diagnosis not present

## 2016-03-14 DIAGNOSIS — Z7901 Long term (current) use of anticoagulants: Secondary | ICD-10-CM | POA: Diagnosis not present

## 2016-03-14 DIAGNOSIS — L89321 Pressure ulcer of left buttock, stage 1: Secondary | ICD-10-CM | POA: Diagnosis not present

## 2016-03-14 DIAGNOSIS — M1990 Unspecified osteoarthritis, unspecified site: Secondary | ICD-10-CM | POA: Diagnosis not present

## 2016-03-14 DIAGNOSIS — L97821 Non-pressure chronic ulcer of other part of left lower leg limited to breakdown of skin: Secondary | ICD-10-CM | POA: Diagnosis not present

## 2016-03-14 DIAGNOSIS — Z86718 Personal history of other venous thrombosis and embolism: Secondary | ICD-10-CM | POA: Diagnosis not present

## 2016-03-14 DIAGNOSIS — L89153 Pressure ulcer of sacral region, stage 3: Secondary | ICD-10-CM | POA: Diagnosis not present

## 2016-03-16 ENCOUNTER — Other Ambulatory Visit: Payer: Self-pay | Admitting: *Deleted

## 2016-03-16 DIAGNOSIS — G546 Phantom limb syndrome with pain: Secondary | ICD-10-CM

## 2016-03-16 DIAGNOSIS — G8929 Other chronic pain: Secondary | ICD-10-CM

## 2016-03-16 MED ORDER — OXYCODONE HCL 5 MG PO TABS: 5.0000 mg | ORAL_TABLET | Freq: Two times a day (BID) | ORAL | Status: DC | PRN

## 2016-03-16 MED ORDER — MORPHINE SULFATE ER 60 MG PO TBCR: 60.0000 mg | EXTENDED_RELEASE_TABLET | Freq: Three times a day (TID) | ORAL | Status: DC

## 2016-03-20 ENCOUNTER — Ambulatory Visit (INDEPENDENT_AMBULATORY_CARE_PROVIDER_SITE_OTHER): Payer: Medicare Other | Admitting: Pharmacist

## 2016-03-20 DIAGNOSIS — Z7901 Long term (current) use of anticoagulants: Secondary | ICD-10-CM

## 2016-03-20 DIAGNOSIS — I749 Embolism and thrombosis of unspecified artery: Secondary | ICD-10-CM

## 2016-03-20 LAB — POCT INR: INR: 1.4

## 2016-03-20 NOTE — Progress Notes (Signed)
Anti-Coagulation Progress Note  [name] is a 55 y.o. male who is currently on an anti-coagulation regimen.    RECENT RESULTS: Recent results are below, the most recent result is correlated with a dose of 27.5 mg. per week: Lab Results  Component Value Date   INR 1.40 03/20/2016   INR 3.00 02/21/2016   INR 2.80 01/24/2016   PROTIME 38.4* 04/01/2012    ANTI-COAG DOSE: Anticoagulation Dose Instructions as of 03/20/2016      [name] Tue Wed Thu Fri Sat   New Dose 5 mg 5 mg 5 mg 5 mg 5 mg 2.5 mg 5 mg       ANTICOAG SUMMARY: Anticoagulation Episode Summary    Current INR goal 2.0-3.0  Next INR check 04/03/2016  INR from last check 1.40! 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  Patient instructed to take today's dose.  Patient verbalized understanding of these instructions.       FOLLOW-UP Return in  2 weeks (on 04/03/2016) for Follow up INR at 1115h.  Jorene Guest, [name] Pharm.D., CACP

## 2016-03-20 NOTE — Patient Instructions (Signed)
Patient instructed to take medications as defined in the Anti-coagulation Track section of this encounter.  Patient instructed to take today's dose.  Patient verbalized understanding of these instructions.    

## 2016-03-23 NOTE — Progress Notes (Signed)
INTERNAL MEDICINE TEACHING ATTENDING ADDENDUM - Vu Liebman M.D  Duration- indefinite, Indication- DVT resulting in AKA, INR- subtherapeutic. Agree with pharmacy recommendations as outlined in their note.

## 2016-03-28 DIAGNOSIS — M1990 Unspecified osteoarthritis, unspecified site: Secondary | ICD-10-CM | POA: Diagnosis not present

## 2016-03-28 DIAGNOSIS — B20 Human immunodeficiency virus [HIV] disease: Secondary | ICD-10-CM | POA: Diagnosis not present

## 2016-03-28 DIAGNOSIS — L89153 Pressure ulcer of sacral region, stage 3: Secondary | ICD-10-CM | POA: Diagnosis not present

## 2016-03-28 DIAGNOSIS — Z872 Personal history of diseases of the skin and subcutaneous tissue: Secondary | ICD-10-CM | POA: Diagnosis not present

## 2016-03-28 DIAGNOSIS — Z7901 Long term (current) use of anticoagulants: Secondary | ICD-10-CM | POA: Diagnosis not present

## 2016-03-28 DIAGNOSIS — Z86718 Personal history of other venous thrombosis and embolism: Secondary | ICD-10-CM | POA: Diagnosis not present

## 2016-03-28 DIAGNOSIS — Z09 Encounter for follow-up examination after completed treatment for conditions other than malignant neoplasm: Secondary | ICD-10-CM | POA: Diagnosis not present

## 2016-04-03 ENCOUNTER — Ambulatory Visit (INDEPENDENT_AMBULATORY_CARE_PROVIDER_SITE_OTHER): Payer: Medicare Other | Admitting: Pharmacist

## 2016-04-03 DIAGNOSIS — Z7901 Long term (current) use of anticoagulants: Secondary | ICD-10-CM | POA: Diagnosis present

## 2016-04-03 DIAGNOSIS — I749 Embolism and thrombosis of unspecified artery: Secondary | ICD-10-CM

## 2016-04-03 LAB — POCT INR: INR: 1.6

## 2016-04-03 NOTE — Progress Notes (Signed)
Anti-Coagulation Progress Note  [name] is a 55 y.o. male who is currently on an anti-coagulation regimen.    RECENT RESULTS: Recent results are below, the most recent result is correlated with a dose of 32.5 mg. per week: Lab Results  Component Value Date   INR 1.60 04/03/2016   INR 1.40 03/20/2016   INR 3.00 02/21/2016   PROTIME 38.4* 04/01/2012    ANTI-COAG DOSE: Anticoagulation Dose Instructions as of 04/03/2016      [name] Tue Wed Thu Fri Sat   New Dose 5 mg 7.5 mg 5 mg 5 mg 5 mg 5 mg 5 mg       ANTICOAG SUMMARY: Anticoagulation Episode Summary    Current INR goal 2.0-3.0  Next INR check 04/24/2016  INR from last check 1.60! 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  Patient instructed to take today's dose.  Patient verbalized understanding of these instructions.       FOLLOW-UP Return in 3  weeks (on 04/24/2016) for Follow up INR at 1015h.  Jorene Guest, [name] Pharm.D., CACP

## 2016-04-03 NOTE — Progress Notes (Signed)
INTERNAL MEDICINE TEACHING ATTENDING ADDENDUM - Aldine Contes M.D  Duration- indefinite, Indication- arterial thrombosis, INR- subtherapeutic. Agree with pharmacy recommendations as outlined in their note.

## 2016-04-03 NOTE — Patient Instructions (Signed)
Patient instructed to take medications as defined in the Anti-coagulation Track section of this encounter.  Patient instructed to take today's dose.  Patient verbalized understanding of these instructions.    

## 2016-04-12 IMAGING — CR DG CHEST 1V PORT
1 series · 1 of 1 positions shown · non-contrast
Comparison: Three days ago

CLINICAL DATA: Abdominal pain and cough

EXAM:
PORTABLE CHEST 1 VIEW

[AP]
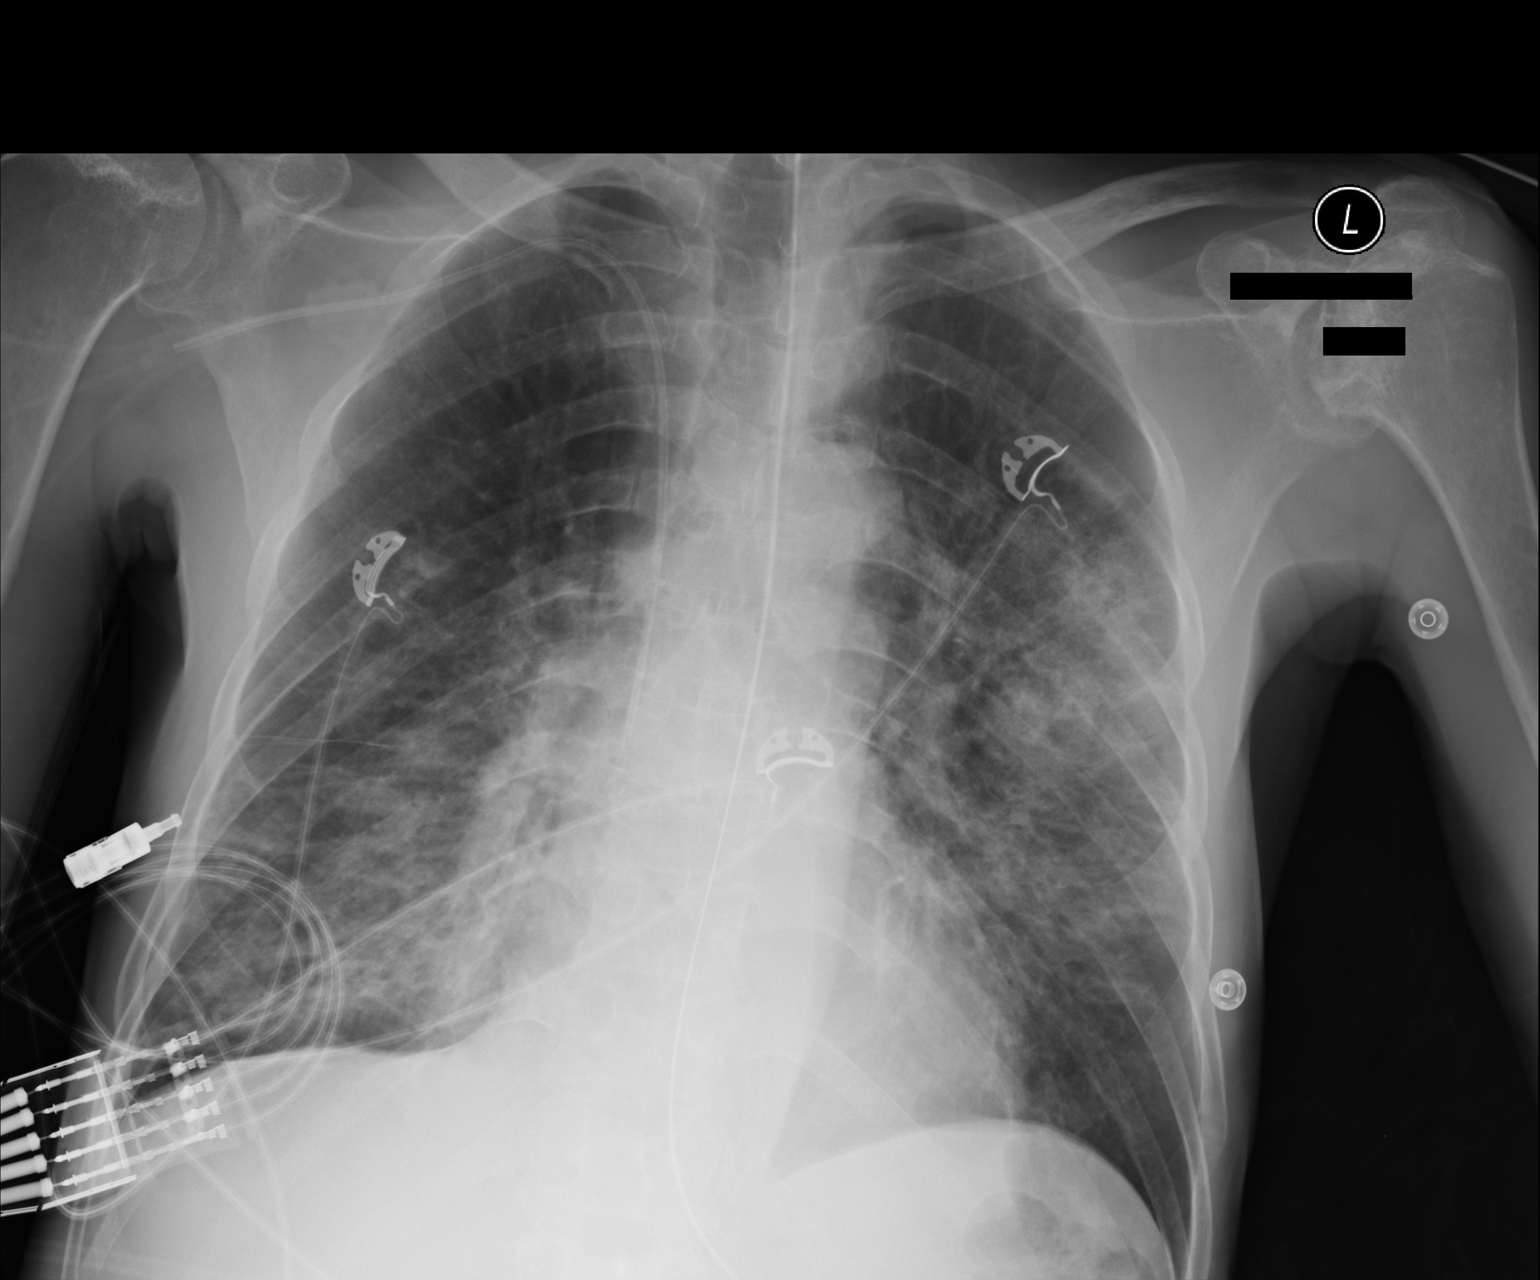

[1 of 1 positions shown; findings below may reference images not displayed]

FINDINGS: Interval tracheal extubation. Nasogastric tube reaches the stomach.
Right subclavian central line, tip at the lower SVC.

Partial clearing of still extensive bilateral airspace disease. No
generalized edema, effusion, or air leak. Normal heart size and
stable mediastinal contours.

Bilateral humeral head avascular necrosis.
IMPRESSION: Bilateral pneumonia has improved since 09/07/2015.

## 2016-04-16 IMAGING — DX DG ABD PORTABLE 1V
1 series · 1 of 1 positions shown · non-contrast
Comparison: 09/10/2015

CLINICAL DATA: Small bowel obstruction.

EXAM:
PORTABLE ABDOMEN - 1 VIEW

[abdomen supine]
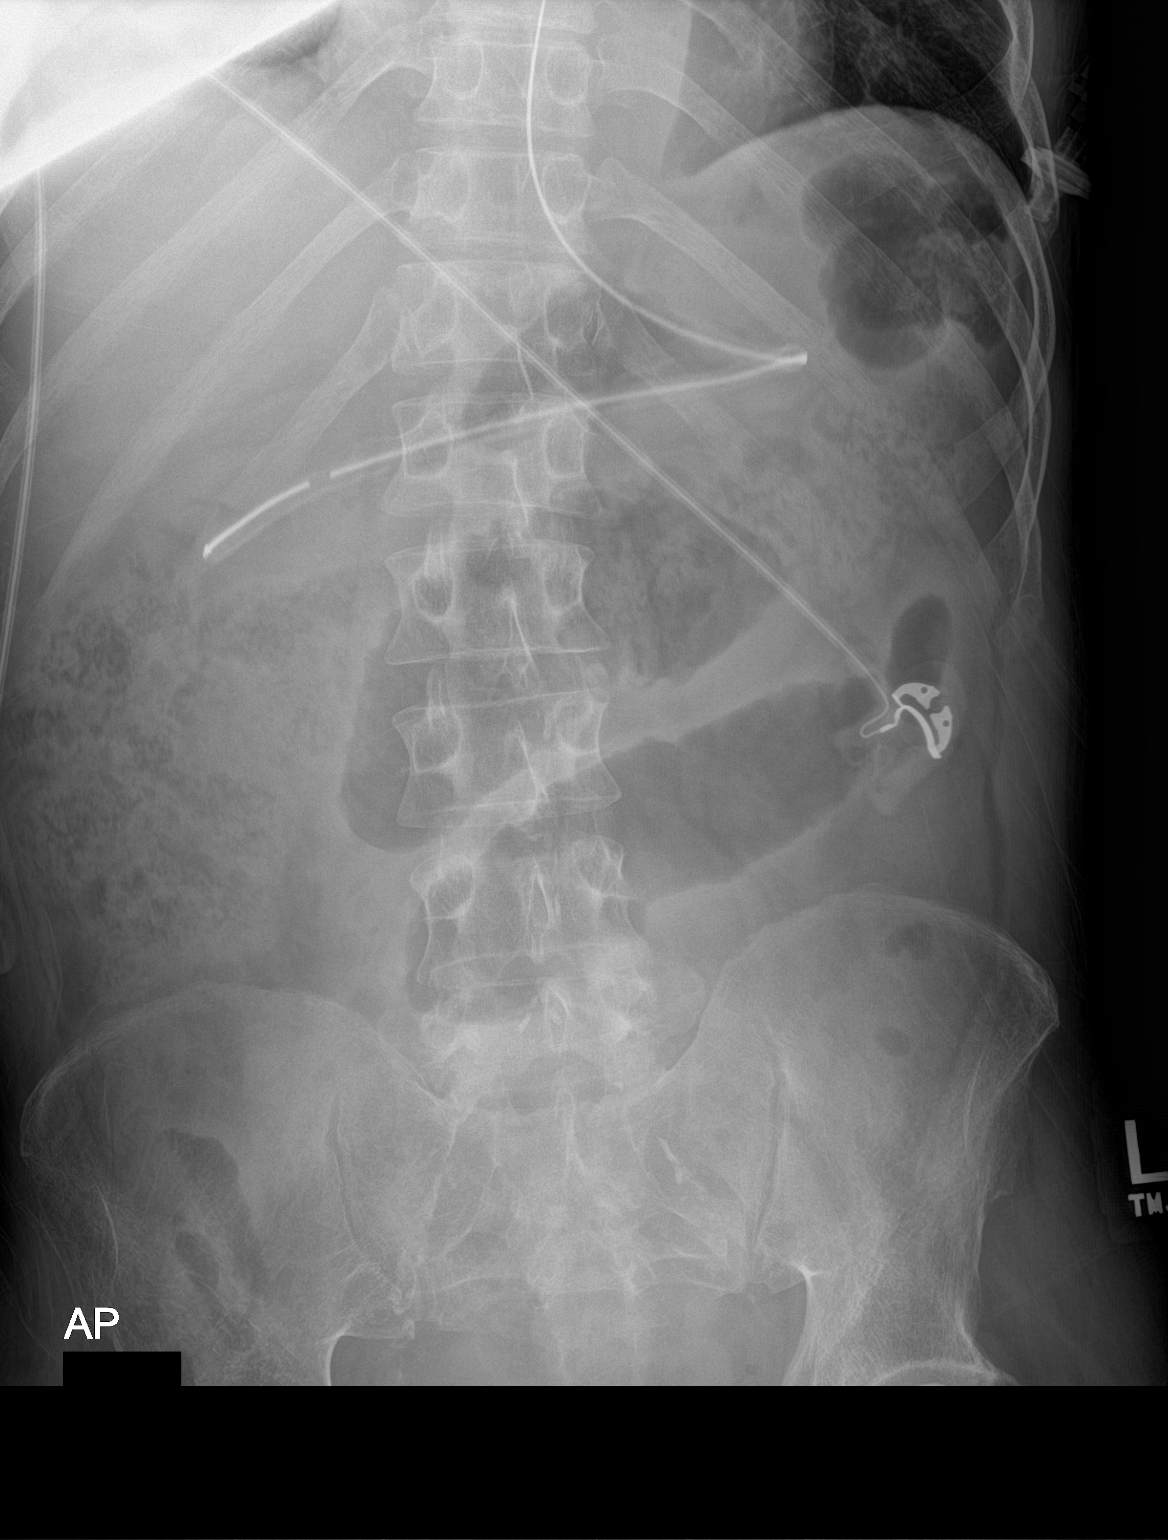

[1 of 1 positions shown; findings below may reference images not displayed]

FINDINGS: There are dilated bowel loops in the mid abdomen, consistent with
the given history of small-bowel obstruction. There is a moderate
amount of formed stool throughout the colon. Enteric catheter is
again seen, with tip at the expected location of the pylorus. Rectal
probe is also noted.

Mixed lytic and sclerotic appearance of the left femoral head is
again seen. Query avascular necrosis.
IMPRESSION: Continued small bowel obstruction, with no significant improvement
from the prior radiograph.

Constipation.

Stable positioning of the enteric catheter with tip at the expected
location of the pylorus.

## 2016-04-17 ENCOUNTER — Other Ambulatory Visit: Payer: Self-pay | Admitting: *Deleted

## 2016-04-17 DIAGNOSIS — G8929 Other chronic pain: Secondary | ICD-10-CM

## 2016-04-17 DIAGNOSIS — G546 Phantom limb syndrome with pain: Secondary | ICD-10-CM

## 2016-04-17 MED ORDER — MORPHINE SULFATE ER 60 MG PO TBCR: 60.0000 mg | EXTENDED_RELEASE_TABLET | Freq: Three times a day (TID) | ORAL | Status: DC

## 2016-04-17 MED ORDER — OXYCODONE HCL 5 MG PO TABS: 5.0000 mg | ORAL_TABLET | Freq: Two times a day (BID) | ORAL | Status: DC | PRN

## 2016-04-19 ENCOUNTER — Encounter: Payer: Self-pay | Admitting: *Deleted

## 2016-04-19 ENCOUNTER — Other Ambulatory Visit: Payer: Self-pay | Admitting: Internal Medicine

## 2016-04-19 DIAGNOSIS — B20 Human immunodeficiency virus [HIV] disease: Secondary | ICD-10-CM

## 2016-04-20 ENCOUNTER — Other Ambulatory Visit: Payer: Self-pay | Admitting: *Deleted

## 2016-04-20 DIAGNOSIS — B20 Human immunodeficiency virus [HIV] disease: Secondary | ICD-10-CM

## 2016-04-20 MED ORDER — DARUNAVIR-COBICISTAT 800-150 MG PO TABS: 1.0000 | ORAL_TABLET | Freq: Every day | ORAL | Status: DC

## 2016-04-24 ENCOUNTER — Ambulatory Visit (INDEPENDENT_AMBULATORY_CARE_PROVIDER_SITE_OTHER): Payer: Medicare Other | Admitting: Pharmacist

## 2016-04-24 DIAGNOSIS — Z7901 Long term (current) use of anticoagulants: Secondary | ICD-10-CM | POA: Diagnosis not present

## 2016-04-24 DIAGNOSIS — I749 Embolism and thrombosis of unspecified artery: Secondary | ICD-10-CM

## 2016-04-24 LAB — POCT INR: INR: 2.5

## 2016-04-24 NOTE — Patient Instructions (Signed)
Patient instructed to take medications as defined in the Anti-coagulation Track section of this encounter.  Patient instructed to take today's dose.  Patient verbalized understanding of these instructions.    

## 2016-04-24 NOTE — Progress Notes (Signed)
Anti-Coagulation Progress Note  [name] is a 55 y.o. male who is currently on an anti-coagulation regimen.    RECENT RESULTS: Recent results are below, the most recent result is correlated with a dose of 37.5 mg. 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] Long term current use of anticoagulant [Z79.01]        Comments       Anticoagulation Care Providers    Provider Role Specialty Phone number   Michel Bickers, MD  Infectious Diseases [phone]      ANTICOAG TODAY: Anticoagulation Summary as of 04/24/2016    INR goal 2.0-3.0  Selected INR 2.50 (04/24/2016)  Next INR check 05/29/2016  Target end date Indefinite   Indications  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  Patient instructed to take today's dose.  Patient verbalized understanding of these instructions.       FOLLOW-UP Return in  5 weeks (on 05/29/2016) for Follow up INR at 1015h.  Jorene Guest, [name] Pharm.D., CACP

## 2016-05-15 ENCOUNTER — Other Ambulatory Visit: Payer: Self-pay | Admitting: *Deleted

## 2016-05-15 DIAGNOSIS — G8929 Other chronic pain: Secondary | ICD-10-CM

## 2016-05-15 DIAGNOSIS — G546 Phantom limb syndrome with pain: Secondary | ICD-10-CM

## 2016-05-15 MED ORDER — OXYCODONE HCL 5 MG PO TABS: 5.0000 mg | ORAL_TABLET | Freq: Two times a day (BID) | ORAL | Status: DC | PRN

## 2016-05-15 MED ORDER — MORPHINE SULFATE ER 60 MG PO TBCR: 60.0000 mg | EXTENDED_RELEASE_TABLET | Freq: Three times a day (TID) | ORAL | Status: DC

## 2016-05-16 ENCOUNTER — Other Ambulatory Visit: Payer: Medicare Other

## 2016-05-16 ENCOUNTER — Other Ambulatory Visit: Payer: Self-pay | Admitting: Internal Medicine

## 2016-05-16 DIAGNOSIS — B20 Human immunodeficiency virus [HIV] disease: Secondary | ICD-10-CM | POA: Diagnosis not present

## 2016-05-16 DIAGNOSIS — Z79899 Other long term (current) drug therapy: Secondary | ICD-10-CM

## 2016-05-16 LAB — COMPREHENSIVE METABOLIC PANEL
ALBUMIN: 4.1 g/dL (ref 3.6–5.1)
ALT: 8 U/L — ABNORMAL LOW (ref 9–46)
AST: 19 U/L (ref 10–35)
Alkaline Phosphatase: 465 U/L — ABNORMAL HIGH (ref 40–115)
BUN: 7 mg/dL (ref 7–25)
CALCIUM: 8.8 mg/dL (ref 8.6–10.3)
CHLORIDE: 106 mmol/L (ref 98–110)
CO2: 17 mmol/L — AB (ref 20–31)
Creat: 1.63 mg/dL — ABNORMAL HIGH (ref 0.70–1.33)
Glucose, Bld: 134 mg/dL — ABNORMAL HIGH (ref 65–99)
POTASSIUM: 3.4 mmol/L — AB (ref 3.5–5.3)
SODIUM: 133 mmol/L — AB (ref 135–146)
Total Bilirubin: 0.4 mg/dL (ref 0.2–1.2)
Total Protein: 7.5 g/dL (ref 6.1–8.1)

## 2016-05-16 LAB — CBC
HEMATOCRIT: 42.5 % (ref 38.5–50.0)
Hemoglobin: 13.9 g/dL (ref 13.2–17.1)
MCH: 30.3 pg (ref 27.0–33.0)
MCHC: 32.7 g/dL (ref 32.0–36.0)
MCV: 92.6 fL (ref 80.0–100.0)
MPV: 9.2 fL (ref 7.5–12.5)
Platelets: 337 10*3/uL (ref 140–400)
RBC: 4.59 MIL/uL (ref 4.20–5.80)
RDW: 15.7 % — ABNORMAL HIGH (ref 11.0–15.0)
WBC: 7.2 10*3/uL (ref 3.8–10.8)

## 2016-05-16 LAB — LIPID PANEL
CHOL/HDL RATIO: 5.3 ratio — AB (ref <=5.0)
Cholesterol: 211 mg/dL — ABNORMAL HIGH (ref 125–200)
HDL: 40 mg/dL (ref >=40)
LDL CALC: 133 mg/dL — AB (ref <130)
TRIGLYCERIDES: 189 mg/dL — AB (ref <150)
VLDL: 38 mg/dL — AB (ref <30)

## 2016-05-17 ENCOUNTER — Encounter: Payer: Self-pay | Admitting: Internal Medicine

## 2016-05-17 ENCOUNTER — Ambulatory Visit (INDEPENDENT_AMBULATORY_CARE_PROVIDER_SITE_OTHER): Payer: Medicare Other | Admitting: Internal Medicine

## 2016-05-17 VITALS — BP 120/67 | HR 90 | Temp 98.3°F

## 2016-05-17 DIAGNOSIS — Z89611 Acquired absence of right leg above knee: Secondary | ICD-10-CM | POA: Diagnosis not present

## 2016-05-17 DIAGNOSIS — F17210 Nicotine dependence, cigarettes, uncomplicated: Secondary | ICD-10-CM

## 2016-05-17 DIAGNOSIS — G546 Phantom limb syndrome with pain: Secondary | ICD-10-CM

## 2016-05-17 LAB — T-HELPER CELL (CD4) - (RCID CLINIC ONLY)
CD4 % Helper T Cell: 23 % — ABNORMAL LOW (ref 33–55)
CD4 T Cell Abs: 940 /uL (ref 400–2700)

## 2016-05-17 LAB — RPR

## 2016-05-17 NOTE — Assessment & Plan Note (Signed)
Patient is here to get new referral for Physical therapy at [place] as [place] is closer to the home. He says that his pain is well controlled today. He takes the oxycodone, and the morphine for the pain.  Plan Referred to PT at [place] -given boost samples

## 2016-05-17 NOTE — Progress Notes (Signed)
Patient ID: [name], male   DOB: 12/19/1960, 55 y.o.   MRN: [id number]    CC: physical therapy new referral HPI: [name] is a 55 y.o. man here to get referral for PT at [place], and to get samples of the Boost   Please see Problem List/A&P for the status of the patient's chronic medical problems   Past Medical History  Diagnosis Date  . History of chicken pox   . Hyperlipidemia   . History of DVT of lower extremity     right  . Left hip pain 03/09/2013  . Pneumonia   . Chronic kidney disease   . Protein malnutrition ([place]) 08/2015  . HIV (human immunodeficiency virus infection) ([place])     Review of Systems:  Negative except as per HPI  Physical Exam: Filed Vitals:   05/17/16 1343  BP: 120/67  Pulse: 90  Temp: 98.3 F (36.8 C)  TempSrc: Oral  SpO2: 97%    General: A&O, in NAD CV: RRR, normal s1, s2, no m/r/g Resp: equal and symmetric breath sounds, no wheezing or crackles  Abdomen: soft, nontender, nondistended, +BS Extremities: Right AKA, has prosthesis    Assessment & Plan:   See encounters tab for problem based medical decision making. Patient discussed with Dr. Angelia Mould

## 2016-05-17 NOTE — Patient Instructions (Addendum)
Thank you for your visit today Please follow up with physical therapy in [place] Please continue to follow up with the infectious disease doctor, and follow up with your PCP

## 2016-05-18 LAB — HIV-1 RNA QUANT-NO REFLEX-BLD
HIV 1 RNA QUANT: 24 {copies}/mL — AB (ref <20)
HIV-1 RNA QUANT, LOG: 1.38 {Log_copies}/mL — AB (ref <1.30)

## 2016-05-19 NOTE — Progress Notes (Signed)
Internal Medicine Clinic Attending  Case discussed with Dr. Saraiya soon after the resident saw the patient.  We reviewed the resident's history and exam and pertinent patient test results.  I agree with the assessment, diagnosis, and plan of care documented in the resident's note.  

## 2016-05-29 ENCOUNTER — Ambulatory Visit (INDEPENDENT_AMBULATORY_CARE_PROVIDER_SITE_OTHER): Payer: Medicare Other | Admitting: Pharmacist

## 2016-05-29 ENCOUNTER — Encounter (INDEPENDENT_AMBULATORY_CARE_PROVIDER_SITE_OTHER): Payer: Self-pay

## 2016-05-29 DIAGNOSIS — Z86718 Personal history of other venous thrombosis and embolism: Secondary | ICD-10-CM

## 2016-05-29 DIAGNOSIS — I749 Embolism and thrombosis of unspecified artery: Secondary | ICD-10-CM | POA: Diagnosis not present

## 2016-05-29 DIAGNOSIS — Z7901 Long term (current) use of anticoagulants: Secondary | ICD-10-CM | POA: Diagnosis present

## 2016-05-29 LAB — POCT INR: INR: 2

## 2016-05-29 NOTE — Progress Notes (Signed)
Anticoagulation Management [name] is a 55 y.o. male who reports to the clinic for monitoring of warfarin treatment.    Indication: history of DVT and arterial thrombosis Duration: indefinite  Anticoagulation Clinic Visit History: Patient does not report signs/symptoms of bleeding or thromboembolism or any other changes  Anticoagulation Episode Summary    Current INR goal:   2.0-3.0  TTR:   61.9 % (5.3 y)  Next INR check:   06/26/2016  INR from last check:   2.0 (05/29/2016)  Weekly max dose:     Target end date:   Indefinite  [facility] check [facility]  Preferred lab:     Send INR reminders to:   ANTICOAG IMP   Indications   Embolism and thrombosis of artery ([place]) [I74.9] Long term current use of anticoagulant [Z79.01]       Comments:         Anticoagulation Care Providers    Provider Role Specialty Phone number   Michel Bickers, MD  Infectious Diseases [phone]     ASSESSMENT Recent Results: The most recent result is correlated with 37.5 mg per week: Lab Results  Component Value Date   INR 2.0 05/29/2016   INR 2.50 04/24/2016   INR 1.60 04/03/2016   PROTIME 38.4 (H) 04/01/2012    Anticoagulation Dosing: INR as of 05/29/2016 and Previous Dosing Information    INR Dt INR Goal [employer] Sun Mon Tue Wed Thu Fri Sat   05/29/2016 2.0 2.0-3.0 37.5 mg 5 mg 7.5 mg 5 mg 5 mg 5 mg 5 mg 5 mg    Anticoagulation Dose Instructions as of 05/29/2016      Total Sun Mon Tue Wed Thu Fri Sat   New Dose 37.5 mg 5 mg 7.5 mg 5 mg 5 mg 5 mg 5 mg 5 mg     (5 mg x 1)  (5 mg x 1.5)  (5 mg x 1)  (5 mg x 1)  (5 mg x 1)  (5 mg x 1)  (5 mg x 1)                           INR today: Therapeutic  PLAN Weekly dose was unchanged   Patient Instructions  Patient educated about medication as defined in this encounter and verbalized understanding by repeating back instructions provided.    Patient advised to contact clinic or seek medical attention if signs/symptoms of  bleeding or thromboembolism occur.  Patient verbalized understanding by repeating back information and was advised to contact me if further medication-related questions arise. Patient was also provided an information handout.  Follow-up Return in about 4 weeks (around 06/26/2016) for Follow up INR 06/26/16 around 9:45am.  [name]  15 minutes spent face-to-face with the patient during the encounter. 50% of time spent on education. 50% of time was spent on assessment and plan.

## 2016-05-29 NOTE — Patient Instructions (Signed)
Patient educated about medication as defined in this encounter and verbalized understanding by repeating back instructions provided.   

## 2016-05-30 ENCOUNTER — Encounter: Payer: Self-pay | Admitting: Internal Medicine

## 2016-05-30 ENCOUNTER — Ambulatory Visit (INDEPENDENT_AMBULATORY_CARE_PROVIDER_SITE_OTHER): Payer: Medicare Other | Admitting: Internal Medicine

## 2016-05-30 DIAGNOSIS — I749 Embolism and thrombosis of unspecified artery: Secondary | ICD-10-CM | POA: Diagnosis not present

## 2016-05-30 DIAGNOSIS — B20 Human immunodeficiency virus [HIV] disease: Secondary | ICD-10-CM

## 2016-05-30 DIAGNOSIS — G546 Phantom limb syndrome with pain: Secondary | ICD-10-CM

## 2016-05-30 MED ORDER — MORPHINE SULFATE ER 30 MG PO TBCR: 90.0000 mg | EXTENDED_RELEASE_TABLET | Freq: Three times a day (TID) | ORAL | 0 refills | Status: DC

## 2016-05-30 NOTE — Assessment & Plan Note (Signed)
His chronic phantom limb pain is under reasonably good control but he is requiring all of his breakthrough pain medication on a daily basis. He met with our ID pharmacist, Onnie Boer, and we decided to increase his MS Contin 90 mg 3 times daily.

## 2016-05-30 NOTE — Progress Notes (Signed)
Patient Active Problem List   Diagnosis Date Noted  . Phantom limb syndrome with pain ([place]) 03/21/2012    Priority: High  . Long term current use of anticoagulant 11/20/2010    Priority: High  . Embolism and thrombosis of artery ([place]) 07/19/2009    Priority: High  . Human immunodeficiency virus (HIV) disease ([place]) 11/10/2006    Priority: High  . DYSLIPIDEMIA 11/10/2006    Priority: High  . CIGARETTE SMOKER 11/10/2006    Priority: High  . CKD (chronic kidney disease) 02/10/2014    Priority: Medium  . Hyperglycemia 12/29/2013    Priority: Medium  . Unintentional weight loss 11/04/2013    Priority: Medium  . Long term current use of opiate analgesic 12/17/2015  . Debilitated 09/22/2015  . Renal tubular acidosis   . AIDS ([place])   . History of above knee amputation ([place])   . SBO (small bowel obstruction) ([place])   . Pressure ulcer 09/13/2015  . Protein-calorie malnutrition, severe 09/13/2015  . Polyuria 08/06/2015  . Prediabetes 08/06/2015  . Hematuria 08/06/2015  . UTI (urinary tract infection) 05/14/2015  . Avascular necrosis of femoral head ([place]) 05/14/2015  . Left adrenal mass ([place]) 05/14/2015  . Left hip pain 03/09/2013  . Preventative health care 03/09/2013  . ERECTILE DYSFUNCTION, ORGANIC 04/12/2010  . GERD 08/23/2009  . METHICILLIN RESISTANT STAPHYLOCOCCUS AUREUS INFECTION 08/05/2009  . ENLARGEMENT OF LYMPH NODES 07/20/2009  . HYPOGONADISM 07/15/2009  . DEFICIENCY OF OTHER VITAMINS 07/15/2009  . PULMONARY NODULE 07/15/2009  . Status post above knee amputation ([place]) 07/15/2009  . SHINGLES, RECURRENT 11/10/2006  . HEPATITIS B 11/10/2006  . PSYCHOSIS 11/10/2006  . Alcohol abuse 11/10/2006  . PERIPHERAL NEUROPATHY 11/10/2006  . ALLERGIC RHINITIS 11/10/2006  . DENTAL CARIES 11/10/2006    Patient's Medications  New Prescriptions   No medications on file  Previous Medications   ACETAMINOPHEN (TYLENOL) 500 MG TABLET    Take 1 tablet (500 mg total) by  mouth every 4 (four) hours as needed for mild pain, moderate pain or headache.   DARUNAVIR-COBICISTAT ([name]) 800-150 MG TABLET    Take 1 tablet by mouth daily. Swallow whole. Do NOT crush, break or chew tablets. Take with food.   GABAPENTIN (NEURONTIN) 400 MG CAPSULE    TAKE 1 CAPSULE BY MOUTH FOUR TIMES DAILY   MAGNESIUM CHLORIDE (SLOW-MAG) 64 MG TBEC SR TABLET    Take 1 tablet (64 mg total) by mouth daily.   MULTIPLE VITAMIN (MULTIVITAMIN WITH MINERALS) TABS TABLET    Take 1 tablet by mouth daily.   OXYCODONE (OXY IR/ROXICODONE) 5 MG IMMEDIATE RELEASE TABLET    Take 1 tablet (5 mg total) by mouth 2 (two) times daily as needed for severe pain.   PANTOPRAZOLE (PROTONIX) 40 MG TABLET    Take 1 tablet (40 mg total) by mouth daily.   PHOSPHORUS (K PHOS NEUTRAL) 155-852-130 MG TABLET    Take 3 tablets (750 mg total) by mouth 2 (two) times daily.   POTASSIUM CHLORIDE SA (K-DUR,KLOR-CON) 20 MEQ TABLET    TAKE 1 TABLET BY MOUTH ONCE DAILY   POTASSIUM CITRATE (UROCIT-K) 10 MEQ (1080 MG) SR TABLET    Take 4 tablets (40 mEq total) by mouth 2 (two) times daily with a meal.   SANTYL OINTMENT    APPLY TOPICALLY TO THE AFFECTED AREA(S) DAILY   SENNA-DOCUSATE (SENOKOT-S) 8.6-50 MG TABLET    Take 2 tablets by mouth 2 (two) times daily.   SODIUM  BICARBONATE 650 MG TABLET    Take 2 tablets (1,300 mg total) by mouth 3 (three) times daily.   [name] 50 MG TABLET    TAKE 1 TABLET BY MOUTH DAILY   [name] 300 MG TABLET    TAKE 1 TABLET BY MOUTH DAILY   WARFARIN (COUMADIN) 5 MG TABLET    Take 1 tablet (5 mg total) by mouth daily at 6 PM.  Modified Medications   Modified Medication Previous Medication   MORPHINE (MS CONTIN) 30 MG 12 HR TABLET morphine (MS CONTIN) 60 MG 12 hr tablet      Take 3 tablets (90 mg total) by mouth every 8 (eight) hours.    Take 1 tablet (60 mg total) by mouth every 8 (eight) hours.  Discontinued Medications   No medications on file    Subjective: [name] is in for his routine HIV  follow-up visit. He recalls missing only one dose of his HIV medications since his last visit. That occurred when he was one pill short on his [name] and new not to take his other medications. He is tolerating them well. He still very weak and recovering from his hospitalization last year. He is using his electric wheelchair more but plans to start physical therapy tomorrow. His chronic phantom limb pain is unchanged. He continues to take MS Contin 60 mg 3 times daily and his maximum oxycodone immediate release for breakthrough pain twice daily. He states that when he takes all of his medication his pain is under reasonable control.   Review of Systems: Review of Systems  Constitutional: Negative for chills, diaphoresis, fever, malaise/fatigue and weight loss.  HENT: Negative for sore throat.   Respiratory: Negative for cough, sputum production and shortness of breath.   Cardiovascular: Negative for chest pain.  Gastrointestinal: Negative for diarrhea, nausea and vomiting.  Genitourinary: Negative for dysuria and frequency.  Musculoskeletal: Positive for joint pain. Negative for myalgias.  Skin: Negative for rash.  Neurological: Positive for weakness. Negative for dizziness and headaches.  Psychiatric/Behavioral: Negative for depression and substance abuse. The patient is not nervous/anxious.     Past Medical History:  Diagnosis Date  . Chronic kidney disease   . History of chicken pox   . History of DVT of lower extremity    right  . HIV (human immunodeficiency virus infection) ([place])   . Hyperlipidemia   . Left hip pain 03/09/2013  . Pneumonia   . Protein malnutrition ([place]) 08/2015    Social History  Substance Use Topics  . Smoking status: Current Every Day Smoker    Packs/day: 0.50    Years: 36.00    Types: Cigarettes  . Smokeless tobacco: Never Used     Comment: ABOUT 1/2PPD.  Slowing down  . Alcohol use No    Family History  Problem Relation Age of Onset  . Arthritis  Mother   . Kidney disease Maternal Grandfather     Allergies  Allergen Reactions  . Dapsone     REACTION: fever  . Sulfamethoxazole-Trimethoprim     REACTION: fever    Objective:  Vitals:   05/30/16 0830  BP: 121/68  Pulse: 94  Temp: 97.7 F (36.5 C)  TempSrc: Oral   There is no height or weight on file to calculate BMI.  Physical Exam  Constitutional: He is oriented to person, place, and time.  He is pleasant and in no distress seated in his electric wheelchair.  HENT:  Mouth/Throat: No oropharyngeal exudate.  Eyes: Conjunctivae are normal.  Cardiovascular: Normal rate and regular rhythm.   No murmur heard. Pulmonary/Chest: Breath sounds normal.  Abdominal: Soft. He exhibits no mass. There is no tenderness.  Musculoskeletal:  He has his right leg prosthesis on.  Neurological: He is alert and oriented to person, place, and time.  Skin: No rash noted.  Psychiatric: Mood and affect normal.    Lab Results Lab Results  Component Value Date   WBC 7.2 05/16/2016   HGB 13.9 05/16/2016   HCT 42.5 05/16/2016   MCV 92.6 05/16/2016   PLT 337 05/16/2016    Lab Results  Component Value Date   CREATININE 1.63 (H) 05/16/2016   BUN 7 05/16/2016   NA 133 (L) 05/16/2016   K 3.4 (L) 05/16/2016   CL 106 05/16/2016   CO2 17 (L) 05/16/2016    Lab Results  Component Value Date   ALT 8 (L) 05/16/2016   AST 19 05/16/2016   ALKPHOS 465 (H) 05/16/2016   BILITOT 0.4 05/16/2016    Lab Results  Component Value Date   CHOL 211 (H) 05/16/2016   HDL 40 05/16/2016   LDLCALC 133 (H) 05/16/2016   TRIG 189 (H) 05/16/2016   CHOLHDL 5.3 (H) 05/16/2016   HIV 1 RNA Quant (copies/mL)  Date Value  05/16/2016 24 (H)  08/23/2015 <20  03/12/2015 <20   CD4 T Cell Abs (/uL)  Date Value  05/16/2016 940  08/23/2015 700  03/12/2015 1,210     Problem List Items Addressed This Visit      High   Human immunodeficiency virus (HIV) disease ([place])    His infection remains under good  control. He will continue his current antiretroviral regimen and follow-up after lab work in 6 months.      Relevant Orders   T-helper cell (CD4)- ([facility] only)   HIV 1 RNA quant-no reflex-bld   CBC   Comprehensive metabolic panel   RPR   Phantom limb syndrome with pain ([place])    His chronic phantom limb pain is under reasonably good control but he is requiring all of his breakthrough pain medication on a daily basis. He met with our ID pharmacist, Onnie Boer, and we decided to increase his MS Contin 90 mg 3 times daily.       Relevant Medications   morphine (MS CONTIN) 30 MG 12 hr tablet    Other Visit Diagnoses   None.       Matthew Bickers, MD [facility] [phone] pager   [phone] cell 05/30/2016, 9:09 AM

## 2016-05-30 NOTE — Progress Notes (Signed)
HPI: [name] is a 55 y.o. male who presents for routine HIV follow-up visit. His chief complaint is his chronic phantom limb pain. His pain is reasonably controlled on MS Contin 60 mg TID, but he is requiring his breakthrough oxycodone 5 mg BID on a daily basis. His previous pain regimen was MS Contin 120 mg BID and he states his pain was better controlled on this regimen.  Allergies: Allergies  Allergen Reactions  . Dapsone     REACTION: fever  . Sulfamethoxazole-Trimethoprim     REACTION: fever    Vitals: Temp: 97.7 F (36.5 C) (08/01 0830) Temp Source: Oral (08/01 0830) BP: 121/68 (08/01 0830) Pulse Rate: 94 (08/01 0830)  Past Medical History: Past Medical History:  Diagnosis Date  . Chronic kidney disease   . History of chicken pox   . History of DVT of lower extremity    right  . HIV (human immunodeficiency virus infection) ([place])   . Hyperlipidemia   . Left hip pain 03/09/2013  . Pneumonia   . Protein malnutrition ([place]) 08/2015    Social History: Social History   Social History  . Marital status: Married    Spouse name: N/A  . Number of children: 3  . Years of education: N/A   Occupational History  .  Unemployed   Social History Main Topics  . Smoking status: Current Every Day Smoker    Packs/day: 0.50    Years: 36.00    Types: Cigarettes  . Smokeless tobacco: Never Used     Comment: ABOUT 1/2PPD.  Slowing down  . Alcohol use No  . Drug use: No  . Sexual activity: Not Currently     Comment: declined condoms   Other Topics Concern  . None   Social History Narrative  . None    Labs: HIV 1 RNA Quant (copies/mL)  Date Value  05/16/2016 24 (H)  08/23/2015 <20  03/12/2015 <20   CD4 T Cell Abs (/uL)  Date Value  05/16/2016 940  08/23/2015 700  03/12/2015 1,210   Hep B S Ab (no units)  Date Value  12/24/2006 YES   Hepatitis B Surface Ag (no units)  Date Value  06/23/2009 NEGATIVE   HCV Ab (no units)  Date Value  06/23/2009  NEGATIVE    CrCl: CrCl cannot be calculated (Unknown ideal weight.).  Lipids:    Component Value Date/Time   CHOL 211 (H) 05/16/2016 0949   TRIG 189 (H) 05/16/2016 0949   HDL 40 05/16/2016 0949   CHOLHDL 5.3 (H) 05/16/2016 0949   VLDL 38 (H) 05/16/2016 0949   LDLCALC 133 (H) 05/16/2016 0949    Assessment: Mr. Slacum chronic pain is not optimally controlled on his current regimen of MS Contin 60 mg TID. He brought in his previous MS Contin prescription bottles. We counted the remaining tablets and found he had sixty-four 60 mg tablets and sixty-six 30 mg tablets left. We advised him to take one of each strength to total 90 mg. TID frequency will last him about 21 days, then he should fill his new prescription.  Recommendations: Increase to MS Contin 90 mg TID   Gwenlyn Perking, PharmD PGY1 Pharmacy Resident Pager: [phone] 05/30/2016 10:09 AM

## 2016-05-30 NOTE — Assessment & Plan Note (Signed)
His infection remains under good control. He will continue his current antiretroviral regimen and follow-up after lab work in 6 months.

## 2016-05-31 ENCOUNTER — Ambulatory Visit: Payer: Medicare Other | Attending: Internal Medicine | Admitting: Physical Therapy

## 2016-05-31 ENCOUNTER — Encounter: Payer: Self-pay | Admitting: Physical Therapy

## 2016-05-31 DIAGNOSIS — R262 Difficulty in walking, not elsewhere classified: Secondary | ICD-10-CM | POA: Diagnosis not present

## 2016-05-31 DIAGNOSIS — M25562 Pain in left knee: Secondary | ICD-10-CM | POA: Diagnosis not present

## 2016-05-31 DIAGNOSIS — R2689 Other abnormalities of gait and mobility: Secondary | ICD-10-CM | POA: Diagnosis not present

## 2016-05-31 DIAGNOSIS — M6281 Muscle weakness (generalized): Secondary | ICD-10-CM | POA: Diagnosis not present

## 2016-05-31 DIAGNOSIS — R2681 Unsteadiness on feet: Secondary | ICD-10-CM | POA: Insufficient documentation

## 2016-05-31 NOTE — Therapy (Signed)
[facility] [address] [place], [place], [address] Phone: [phone]   Fax:  [phone]  Physical Therapy Evaluation  Patient Details  Name: [name] MRN: [id number] Date of Birth: 1961-04-29 Referring Provider: Burgess Estelle, MD resident Joni Reining, DO)  Encounter Date: 05/31/2016      PT End of Session - 05/31/16 2128    Visit Number 1   Number of Visits 17   Date for PT Re-Evaluation 07/28/16   Authorization Type Medicare G-code & progress note   PT Start Time 0930   PT Stop Time 1015   PT Time Calculation (min) 45 min   Equipment Utilized During Treatment Gait belt   Activity Tolerance Patient limited by pain   Behavior During Therapy [facility] for tasks assessed/performed      Past Medical History:  Diagnosis Date  . Chronic kidney disease   . History of chicken pox   . History of DVT of lower extremity    right  . HIV (human immunodeficiency virus infection) ([place])   . Hyperlipidemia   . Left hip pain 03/09/2013  . Pneumonia   . Protein malnutrition ([place]) 08/2015    Past Surgical History:  Procedure Laterality Date  . APPENDECTOMY  1962  . LEG AMPUTATION ABOVE KNEE  2010   right leg    There were no vitals filed for this visit.       Subjective Assessment - 05/31/16 0941    Subjective This 55yo male has history of Right AKA due to DVT & HIV. He was functioning with prosthesis only in home & limited community and with forearm crutch (es) fuller community. He was hospitalized 09/06/2015 with vomiting blood & dark stools requiring intubation. He was transferred to [facility] on 09/09/2016 then to inpatient rehab unit 09/19/2016 with discharge 09/28/2016. He lost several pounds and his prosthesis was ill-fitting and he further debilitated with immobility with inability to wear his prosthesis. He recieved a socket revision 11/29/15 but was unable to wear or initiate prosthesis use PT due to  ongoing medical issues including sacral decubitus that limited sitting up. He was referred to PT for evaluation on 05/17/2016.    Pertinent History Right AKA, HIV, shingles, DVT, Lt hip pain, CKD, Hep B,    Limitations Lifting;Standing;Walking   Patient Stated Goals He wants to work on walking & "get muscles toned up"   Currently in Pain? Yes   Pain Score 7    Pain Location Knee  all over but mainly in shoulders   Pain Orientation Left   Pain Descriptors / Indicators Aching   Pain Type Chronic pain   Pain Onset More than a month ago   Pain Frequency Constant   Aggravating Factors  stiffness, over doing it   Pain Relieving Factors pain medications   Effect of Pain on Daily Activities standing & gait limited   Multiple Pain Sites Yes            [facility] PT Assessment - 05/31/16 0930      Assessment   Medical Diagnosis Right Transfemoral Amputation   Referring Provider Burgess Estelle, MD resident  Joni Reining, DO   Onset Date/Surgical Date 05/17/16  MD referral once able to sit with sacral decubitus   Hand Dominance Right     Precautions   Precautions Fall     Restrictions   Weight Bearing Restrictions No     Balance Screen   Has the patient fallen in the past 6  months Yes   How many times? 3   Has the patient had a decrease in activity level because of a fear of falling?  Yes   Is the patient reluctant to leave their home because of a fear of falling?  No     Home Environment   Living Environment Private residence   Living Arrangements Children   Type of [place] One level  w/c accessible   Home Equipment [place] - single point;Wheelchair - power;Walker - 2 wheels;Bedside commode;Shower seat;Grab bars - tub/shower;Hand held shower head     Prior Function   Level of Independence Independent;Independent with household mobility without device;Independent with community mobility with device  best since amputation with  prosthesis, community w/crutch   Vocation On disability     Observation/Other Assessments   Focus on Therapeutic Outcomes (FOTO)  29.92 Functional Status   Activities of Balance Confidence Scale (ABC Scale)  21.9%   Fear Avoidance Belief Questionnaire (FABQ)  66 (18)     Posture/Postural Control   Posture/Postural Control Postural limitations   Postural Limitations Rounded Shoulders;Forward head;Flexed trunk;Weight shift left     ROM / Strength   AROM / PROM / Strength AROM;Strength     AROM   Overall AROM  Deficits   AROM Assessment Site Hip;Knee;Ankle   Right/Left Hip Left;Right   Right Hip Extension -20   Left Hip Extension -20   Right/Left Knee Left   Left Knee Extension -9   Left Knee Flexion 92   Right/Left Ankle Left   Left Ankle Dorsiflexion -8     Strength   Overall Strength Deficits   Strength Assessment Site Hip;Knee;Ankle   Right/Left Hip Right;Left   Right Hip Flexion 3+/5   Right Hip Extension 3-/5   Right Hip ABduction 3-/5   Left Hip Flexion 4-/5   Left Hip Extension 3-/5   Left Hip ABduction 3-/5   Right/Left Knee Left   Left Knee Flexion 3-/5   Left Knee Extension 3+/5   Right/Left Ankle Left   Left Ankle Dorsiflexion 5/5     Special Tests    Special Tests Knee Special Tests   Knee Special tests  Step-up/Step Down Test     Step-up/Step Down    Findings Positive   Side  Left     Transfers   Transfers Sit to Stand;Stand to Sit   Sit to Stand 5: Supervision;With upper extremity assist;With armrests;From elevated surface;From chair/3-in-1  uses back of legs to stabilize   Stand to Sit 5: Supervision;With upper extremity assist;With armrests;To chair/3-in-1  uses back of legs against chair to stabilize     Ambulation/Gait   Ambulation/Gait Yes   Ambulation/Gait Assistance 4: Min assist;3: Mod assist  MinA with RW & ModA with single crutch w/ prosthesis   Ambulation/Gait Assistance Details Left knee pain & strength limiting gait   Ambulation  Distance (Feet) 100 Feet  100' w/ RW & 10' with single crutch   Assistive device Prosthesis;Rolling walker;R Forearm Crutch   Gait Pattern Step-through pattern;Decreased step length - left;Decreased stance time - right;Decreased stride length;Decreased hip/knee flexion - right;Decreased hip/knee flexion - left;Antalgic;Trunk flexed;Abducted- right   Ambulation Surface Indoor;Level   Gait velocity 0.77 ft/sec  indicates limited household & fall risk   Stairs Yes   Stairs Assistance 5: Supervision   Stair Management Technique Two rails;Step to pattern;Forwards   Number of Stairs 4     Standardized Balance Assessment  Standardized Balance Assessment Berg Balance Test     Berg Balance Test   Sit to Stand Needs minimal aid to stand or to stabilize   Standing Unsupported Able to stand 2 minutes with supervision   Sitting with Back Unsupported but Feet Supported on Floor or Stool Able to sit safely and securely 2 minutes   Stand to Sit Uses backs of legs against chair to control descent   Transfers Able to transfer safely, definite need of hands   Standing Unsupported with Eyes Closed Able to stand 10 seconds with supervision   Standing Ubsupported with Feet Together Needs help to attain position but able to stand for 30 seconds with feet together   From Standing, Reach Forward with Outstretched Arm Reaches forward but needs supervision   From Standing Position, Pick up Object from Floor Unable to pick up and needs supervision   From Standing Position, Turn to Look Behind Over each Shoulder Needs assist to keep from losing balance and falling   Turn 360 Degrees Needs assistance while turning   Standing Unsupported, Alternately Place Feet on Step/Stool Needs assistance to keep from falling or unable to try   Standing Unsupported, One Foot in [employer] balance while stepping or standing   Standing on One Leg Unable to try or needs assist to prevent fall   Total Score 19          Prosthetics Assessment - 05/31/16 0930      Prosthetics   Prosthetic Care Independent with Skin check   Prosthetic Care Dependent with Residual limb care;Correct ply sock adjustment;Proper wear schedule/adjustment   Current prosthetic wear tolerance (days/week)  reports 2 of 7 days per week   Current prosthetic wear tolerance (#hours/day)  reports up to 4 hrs but only when leaving apt   Current prosthetic weight-bearing tolerance (hours/day)  left knee pain & right limb pain with standing 5 minutes.                  [facility] Adult PT Treatment/Exercise - 05/31/16 0930      Prosthetics   Prosthetic Care Comments  PT instructed daily wear initiating upon arising to tolerance    Education Provided Proper wear schedule/adjustment                  PT Short Term Goals - 05/31/16 2147      PT SHORT TERM GOAL #1   Title Patient tolerates wear of prosthesis daily >5 hrs without limb pain. (Target Date: 06/30/2016)   Time 4   Period Weeks   Status New     PT SHORT TERM GOAL #2   Title Patient ambulates 200' with RW & prosthesis with supervision. (Target Date: 06/30/2016)   Time 4   Period Weeks   Status New     PT SHORT TERM GOAL #3   Title Patient negotiates ramps, curbs with RW and stairs with 1 rail with prosthesis with supervision. (Target Date: 06/30/2016)   Time 4   Period Weeks   Status New     PT SHORT TERM GOAL #4   Title Patient reaches 10" and to floor with UE support modified independent. (Target Date: 06/30/2016)   Time 4   Period Weeks   Status New     PT SHORT TERM GOAL #5   Title Patient reports left knee pain <6/10 with standing & gait activities. (Target Date: 06/30/2016)   Time 4   Period Weeks   Status New  PT Long Term Goals - 2016-06-25 22-Feb-2137      PT LONG TERM GOAL #1   Title Patient tolerates prosthesis wear >80% of awake hours without limb pain or skin issues. (Target Date: 07/28/2016)   Time 8   Period Weeks   Status New      PT LONG TERM GOAL #2   Title Patient ambulates 400' with LRAD & prosthesis modified independent for community mobility. (Target Date: 07/28/2016)   Time 8   Period Weeks   Status New     PT LONG TERM GOAL #3   Title Patient negotiates ramps, curbs & stairs with LRAD & prosthesis modified independent for community access. (Target Date: 07/28/2016)   Time 8   Period Weeks   Status New     PT LONG TERM GOAL #4   Title Patient ambulates 46' with single crutch or less and prosthesis modified independent for household mobility. (Target Date: 07/28/2016)   Time 8   Period Weeks   Status New     PT LONG TERM GOAL #5   Title Berg Balance >45/56 to indicate lower fall risk. (Target Date: 07/28/2016)   Time 8   Period Weeks   Status New               Plan - 06-25-16 2129/02/22    Clinical Impression Statement This 55yo male was functioning with prosthesis at full community level prior to hospitalization for 24 days and limited mobilty causing severe deconditioning. He has impaired flexibility, decreased strength, limited endurance & poor posture limiting standing tolerance. His knee pain appears arthritic in nature & is limiting mobility. Patient's gait is dependent on assistance & significant BUE support. His balance is impaired with high fall risk. Patient would benefit from skilled PT to improve mobility. His condition is evolving & plan of care is moderate difficulty.    Rehab Potential Good   PT Frequency 2x / week   PT Duration 8 weeks   PT Treatment/Interventions ADLs/Self Care Home Management;Moist Heat;Ultrasound;DME Instruction;Gait training;Stair training;Functional mobility training;Therapeutic activities;Therapeutic exercise;Balance training;Neuromuscular re-education;Patient/family education;Prosthetic Training   PT Next Visit Plan prosthetic care including wear, HEP for flexibility & strength,    Consulted and Agree with Plan of Care Patient      Patient will benefit from  skilled therapeutic intervention in order to improve the following deficits and impairments:  Abnormal gait, Decreased activity tolerance, Decreased balance, Decreased coordination, Decreased mobility, Decreased strength, Difficulty walking, Decreased knowledge of use of DME, Impaired flexibility, Postural dysfunction, Prosthetic Dependency, Pain  Visit Diagnosis: Muscle weakness (generalized)  Other abnormalities of gait and mobility  Unsteadiness on feet  Pain in left knee  Difficulty in walking, not elsewhere classified      G-Codes - 06/25/2016 02/23/51    Functional Assessment Tool Used Patient wears prosthesis ~2 of 7 days/wk up to 5hrs but only when leaving his apt.     Berg Balance 19/56   Functional Limitation Mobility: Walking and moving around   Mobility: Walking and Moving Around Current Status [phone]) At least 60 percent but less than 80 percent impaired, limited or restricted   Mobility: Walking and Moving Around Goal Status [phone]) At least 20 percent but less than 40 percent impaired, limited or restricted       Problem List Patient Active Problem List   Diagnosis Date Noted  . Long term current use of opiate analgesic 12/17/2015  . Debilitated 09/22/2015  . Renal tubular acidosis   . AIDS ([place])   .  History of above knee amputation ([place])   . SBO (small bowel obstruction) ([place])   . Pressure ulcer 09/13/2015  . Protein-calorie malnutrition, severe 09/13/2015  . Polyuria 08/06/2015  . Prediabetes 08/06/2015  . Hematuria 08/06/2015  . UTI (urinary tract infection) 05/14/2015  . Avascular necrosis of femoral head ([place]) 05/14/2015  . Left adrenal mass ([place]) 05/14/2015  . CKD (chronic kidney disease) 02/10/2014  . Hyperglycemia 12/29/2013  . Unintentional weight loss 11/04/2013  . Left hip pain 03/09/2013  . Preventative health care 03/09/2013  . Phantom limb syndrome with pain ([place]) 03/21/2012  . Long term current use of anticoagulant 11/20/2010  . ERECTILE  DYSFUNCTION, ORGANIC 04/12/2010  . GERD 08/23/2009  . METHICILLIN RESISTANT STAPHYLOCOCCUS AUREUS INFECTION 08/05/2009  . ENLARGEMENT OF LYMPH NODES 07/20/2009  . Embolism and thrombosis of artery ([place]) 07/19/2009  . HYPOGONADISM 07/15/2009  . DEFICIENCY OF OTHER VITAMINS 07/15/2009  . PULMONARY NODULE 07/15/2009  . Status post above knee amputation ([place]) 07/15/2009  . Human immunodeficiency virus (HIV) disease ([place]) 11/10/2006  . SHINGLES, RECURRENT 11/10/2006  . HEPATITIS B 11/10/2006  . DYSLIPIDEMIA 11/10/2006  . PSYCHOSIS 11/10/2006  . Alcohol abuse 11/10/2006  . CIGARETTE SMOKER 11/10/2006  . PERIPHERAL NEUROPATHY 11/10/2006  . ALLERGIC RHINITIS 11/10/2006  . DENTAL CARIES 11/10/2006    Jamey Reas PT, DPT 06/01/2016, 7:46 AM  [facility] [address] [place], [place], [address] Phone: [phone]   Fax:  [phone]  Name: [name] MRN: [id number] Date of Birth: [birth year]

## 2016-06-07 ENCOUNTER — Ambulatory Visit: Payer: Medicare Other | Admitting: Physical Therapy

## 2016-06-07 ENCOUNTER — Encounter: Payer: Self-pay | Admitting: Physical Therapy

## 2016-06-07 DIAGNOSIS — M25562 Pain in left knee: Secondary | ICD-10-CM | POA: Diagnosis not present

## 2016-06-07 DIAGNOSIS — M6281 Muscle weakness (generalized): Secondary | ICD-10-CM

## 2016-06-07 DIAGNOSIS — R2681 Unsteadiness on feet: Secondary | ICD-10-CM | POA: Diagnosis not present

## 2016-06-07 DIAGNOSIS — R2689 Other abnormalities of gait and mobility: Secondary | ICD-10-CM | POA: Diagnosis not present

## 2016-06-07 DIAGNOSIS — R262 Difficulty in walking, not elsewhere classified: Secondary | ICD-10-CM | POA: Diagnosis not present

## 2016-06-08 NOTE — Progress Notes (Signed)
I have reviewed Dr. Julianne Rice note.  Patient is on [facility] for venous and arterial thrombosis.  INR at goal.

## 2016-06-08 NOTE — Therapy (Signed)
[facility] [address] [place], [place], [address] Phone: [phone]   Fax:  [phone]  Physical Therapy Treatment  Patient Details  Name: [name] MRN: [id number] Date of Birth: [birth year] Referring Provider: Burgess Estelle, MD resident Joni Reining, DO)  Encounter Date: 06/07/2016   06/07/16 0900  PT Visits / Re-Eval  Visit Number 2  Number of Visits 17  Date for PT Re-Evaluation 07/28/16  Authorization  Authorization Type Medicare G-code & progress note  PT Time Calculation  PT Start Time [phone] (pt arrived early for 9:30 apt, brought back when 8:45 did not show up)  PT Stop Time 0934  PT Time Calculation (min) 39 min  PT - End of Session  Equipment Utilized During Treatment Gait belt  Activity Tolerance Patient limited by pain  Behavior During Therapy [facility] for tasks assessed/performed     Past Medical History:  Diagnosis Date  . Chronic kidney disease   . History of chicken pox   . History of DVT of lower extremity    right  . HIV (human immunodeficiency virus infection) ([place])   . Hyperlipidemia   . Left hip pain 03/09/2013  . Pneumonia   . Protein malnutrition ([place]) 08/2015    Past Surgical History:  Procedure Laterality Date  . APPENDECTOMY  1962  . LEG AMPUTATION ABOVE KNEE  2010   right leg    There were no vitals filed for this visit.     06/07/16 0857  Symptoms/Limitations  Subjective No new complaints. No falls to report. Has not been wearing prosthesis on consistent basis.  Pertinent History Right AKA, HIV, shingles, DVT, Lt hip pain, CKD, Hep B,   Limitations Lifting;Standing;Walking  Patient Stated Goals He wants to work on walking & "get muscles toned up"  Pain Assessment  Currently in Pain? Yes  Pain Score 8  Pain Location Generalized (all over, mainly shoulders and knee)  Pain Descriptors / Indicators Aching;Sore;Throbbing  Pain Type Chronic pain  Pain Onset More than  a month ago  Pain Frequency Constant  Aggravating Factors  increased activity, stiffness  Pain Relieving Factors pain medication, rest      06/07/16 0903  Transfers  Transfers Sit to Stand;Stand to Sit  Sit to Stand 5: Supervision;With upper extremity assist;With armrests;From elevated surface;From chair/3-in-1  Stand to Sit 5: Supervision;With upper extremity assist;With armrests;To chair/3-in-1  Ambulation/Gait  Ambulation/Gait Yes  Ambulation/Gait Assistance 4: Min guard  Ambulation/Gait Assistance Details smooth steps with gait today with cues on increased left step length, for upright posture with gait using RW.  Ambulation Distance (Feet) 80 Feet (x2, 120 x1)  Assistive device Rolling walker;Prosthesis  Gait Pattern Step-through pattern;Decreased step length - left;Decreased stance time - right;Decreased stride length;Decreased hip/knee flexion - left;Trunk flexed;Abducted- right  Ambulation Surface Level;Indoor  Prosthetics  Prosthetic Care Comments  reinforced dialy wear of 2 hours in morning every day and if possible 2 more hours in pm.  Current prosthetic wear tolerance (days/week)  not daily, skips up to 2 days at a time  Current prosthetic wear tolerance (#hours/day)  up to 4 hours at a time  Education Provided Residual limb care;Proper wear schedule/adjustment;Proper weight-bearing schedule/adjustment  Person(s) Educated Patient  Education Method Explanation;Demonstration;Verbal cues  Education Method Verbalized understanding;Verbal cues required;Needs further instruction  Donning Prosthesis 5  Doffing Prosthesis 5          PT Short Term Goals - 05/31/16 2147      PT SHORT TERM GOAL #1  Title Patient tolerates wear of prosthesis daily >5 hrs without limb pain. (Target Date: 06/30/2016)   Time 4   Period Weeks   Status New     PT SHORT TERM GOAL #2   Title Patient ambulates 200' with RW & prosthesis with supervision. (Target Date: 06/30/2016)   Time 4   Period  Weeks   Status New     PT SHORT TERM GOAL #3   Title Patient negotiates ramps, curbs with RW and stairs with 1 rail with prosthesis with supervision. (Target Date: 06/30/2016)   Time 4   Period Weeks   Status New     PT SHORT TERM GOAL #4   Title Patient reaches 10" and to floor with UE support modified independent. (Target Date: 06/30/2016)   Time 4   Period Weeks   Status New     PT SHORT TERM GOAL #5   Title Patient reports left knee pain <6/10 with standing & gait activities. (Target Date: 06/30/2016)   Time 4   Period Weeks   Status New           PT Long Term Goals - 05/31/16 2138      PT LONG TERM GOAL #1   Title Patient tolerates prosthesis wear >80% of awake hours without limb pain or skin issues. (Target Date: 07/28/2016)   Time 8   Period Weeks   Status New     PT LONG TERM GOAL #2   Title Patient ambulates 400' with LRAD & prosthesis modified independent for community mobility. (Target Date: 07/28/2016)   Time 8   Period Weeks   Status New     PT LONG TERM GOAL #3   Title Patient negotiates ramps, curbs & stairs with LRAD & prosthesis modified independent for community access. (Target Date: 07/28/2016)   Time 8   Period Weeks   Status New     PT LONG TERM GOAL #4   Title Patient ambulates 23' with single crutch or less and prosthesis modified independent for household mobility. (Target Date: 07/28/2016)   Time 8   Period Weeks   Status New     PT LONG TERM GOAL #5   Title Berg Balance >45/56 to indicate lower fall risk. (Target Date: 07/28/2016)   Time 8   Period Weeks   Status New        06/07/16 0900  Plan  Clinical Impression Statement Today's session addressed gait and pt's percieved leg length descrepancy. Pt feels his prosthesis is longer than his other leg. On check of pt's pelvic height and ASIS position it was found that his prosthesis is actually ~1 inch shorter than his other leg. Had pt remove his self made shoe lifts from the left shoe for  some gait and then add back in for remainder of gait. No signifcant difference was noted with gait or prosthetic clearance with swing phase therfore left them in so to satisfy pt and his feeling of prosthesis being too long. Pt did report increased pain with gait that decreased with rest breaks. Pt is making progress toward goals.                              Pt will benefit from skilled therapeutic intervention in order to improve on the following deficits Abnormal gait;Decreased activity tolerance;Decreased balance;Decreased coordination;Decreased mobility;Decreased strength;Difficulty walking;Decreased knowledge of use of DME;Impaired flexibility;Postural dysfunction;Prosthetic Dependency;Pain  Rehab Potential Good  PT Frequency 2x /  week  PT Duration 8 weeks  PT Treatment/Interventions ADLs/Self Care Home Management;Moist Heat;Ultrasound;DME Instruction;Gait training;Stair training;Functional mobility training;Therapeutic activities;Therapeutic exercise;Balance training;Neuromuscular re-education;Patient/family education;Prosthetic Training  PT Next Visit Plan prosthetic care including wear, HEP for flexibility & strength,   Consulted and Agree with Plan of Care Patient      Patient will benefit from skilled therapeutic intervention in order to improve the following deficits and impairments:  Abnormal gait, Decreased activity tolerance, Decreased balance, Decreased coordination, Decreased mobility, Decreased strength, Difficulty walking, Decreased knowledge of use of DME, Impaired flexibility, Postural dysfunction, Prosthetic Dependency, Pain  Visit Diagnosis: Muscle weakness (generalized)  Other abnormalities of gait and mobility  Unsteadiness on feet  Pain in left knee  Difficulty in walking, not elsewhere classified     Problem List Patient Active Problem List   Diagnosis Date Noted  . Long term current use of opiate analgesic 12/17/2015  . Debilitated 09/22/2015  . Renal  tubular acidosis   . AIDS ([place])   . History of above knee amputation ([place])   . SBO (small bowel obstruction) ([place])   . Pressure ulcer 09/13/2015  . Protein-calorie malnutrition, severe 09/13/2015  . Polyuria 08/06/2015  . Prediabetes 08/06/2015  . Hematuria 08/06/2015  . UTI (urinary tract infection) 05/14/2015  . Avascular necrosis of femoral head ([place]) 05/14/2015  . Left adrenal mass ([place]) 05/14/2015  . CKD (chronic kidney disease) 02/10/2014  . Hyperglycemia 12/29/2013  . Unintentional weight loss 11/04/2013  . Left hip pain 03/09/2013  . Preventative health care 03/09/2013  . Phantom limb syndrome with pain ([place]) 03/21/2012  . Long term current use of anticoagulant 11/20/2010  . ERECTILE DYSFUNCTION, ORGANIC 04/12/2010  . GERD 08/23/2009  . METHICILLIN RESISTANT STAPHYLOCOCCUS AUREUS INFECTION 08/05/2009  . ENLARGEMENT OF LYMPH NODES 07/20/2009  . Embolism and thrombosis of artery ([place]) 07/19/2009  . HYPOGONADISM 07/15/2009  . DEFICIENCY OF OTHER VITAMINS 07/15/2009  . PULMONARY NODULE 07/15/2009  . Status post above knee amputation ([place]) 07/15/2009  . Human immunodeficiency virus (HIV) disease ([place]) 11/10/2006  . SHINGLES, RECURRENT 11/10/2006  . HEPATITIS B 11/10/2006  . DYSLIPIDEMIA 11/10/2006  . PSYCHOSIS 11/10/2006  . Alcohol abuse 11/10/2006  . CIGARETTE SMOKER 11/10/2006  . PERIPHERAL NEUROPATHY 11/10/2006  . ALLERGIC RHINITIS 11/10/2006  . DENTAL CARIES 11/10/2006    Willow Ora, PTA, [facility] [address], [place], [place] [address] [phone] 06/08/16, 7:57 PM   Name: [name] MRN: [id number] Date of Birth: 12-Mar-1961

## 2016-06-09 ENCOUNTER — Ambulatory Visit: Payer: Medicare Other | Admitting: Physical Therapy

## 2016-06-09 ENCOUNTER — Encounter: Payer: Self-pay | Admitting: Physical Therapy

## 2016-06-09 DIAGNOSIS — M25562 Pain in left knee: Secondary | ICD-10-CM | POA: Diagnosis not present

## 2016-06-09 DIAGNOSIS — R262 Difficulty in walking, not elsewhere classified: Secondary | ICD-10-CM | POA: Diagnosis not present

## 2016-06-09 DIAGNOSIS — M6281 Muscle weakness (generalized): Secondary | ICD-10-CM | POA: Diagnosis not present

## 2016-06-09 DIAGNOSIS — R2681 Unsteadiness on feet: Secondary | ICD-10-CM

## 2016-06-09 DIAGNOSIS — R2689 Other abnormalities of gait and mobility: Secondary | ICD-10-CM

## 2016-06-09 NOTE — Patient Instructions (Addendum)
Bridging    Slowly raise buttocks from floor, keeping stomach tight. Hold for 5 seconds. Repeat _10___ times per set. Do __2__ sets per session. Do __1-2__ sessions per day.  http://orth.exer.us/1096   Copyright  VHI. All rights reserved.  Knee-to-Chest Stretch: Bilateral    Holding a towel placed behind both knees, pull both knees in to chest until a comfortable stretch is felt in lower back and buttocks. Keep back relaxed. Hold __20_ seconds. Repeat __3_ times per set. Do __1__ sets per session. Do _1-2__ sessions per day.  http://orth.exer.us/128   Copyright  VHI. All rights reserved.  EXTENSION: Supine - Over Bolster (Active - Assistance)    Lie on back, pillow under left leg, right prosthesis in bent position with foot resting on bed. Straighten left knee by pushing knee into pillow and lifting foot up off bed. Hold for 5 seconds. Complete __2_ sets of __10_ repetitions. Perform _1-2__ sessions per day.  Copyright  VHI. All rights reserved.    Knee: Extension    Be sure bottom is flat and back supported (can do this while seated in power chair). Place left foot on stool or another chair. Push gently down on thigh to straighten knee. Do not allow leg to twist. Hold __30__ seconds. Repeat ____ times. Do ____ sessions per day. CAUTION: Do not force joint. Stretch should be gentle and slow.  Copyright  VHI. All rights reserved.    Sitting With Rotation    Sit on edge of seat with one hand on opposite knee, other on chair. Keep knees parallel. Pull with hand on knee and, if needed, push with hand on chair to rotate trunk to that side. Hold _10__ seconds. Repeat __4_ times per session. Do __1-2_ sessions per day.  Copyright  VHI. All rights reserved.  Half Lean, Sitting    Sit on chair. Lean forward. Hold _20__ seconds. To return, put forearms on knees and push up. For greater stretch reach arms toward back legs of chair.  Repeat _3__ times per session. Do  _1-2_ sessions per day.   Copyright  VHI. All rights reserved.

## 2016-06-12 NOTE — Therapy (Signed)
[facility] [address] [place], [place], [address] Phone: [phone]   Fax:  [phone]  Physical Therapy Treatment  Patient Details  Name: [name] MRN: [id number] Date of Birth: December 26, 1960 Referring Provider: Burgess Estelle, MD resident Joni Reining, DO)  Encounter Date: 06/09/2016   06/09/16 1022  PT Visits / Re-Eval  Visit Number 3  Number of Visits 17  Date for PT Re-Evaluation 07/28/16  Authorization  Authorization Type Medicare G-code & progress note  PT Time Calculation  PT Start Time 1018  PT Stop Time 1058  PT Time Calculation (min) 40 min  PT - End of Session  Equipment Utilized During Treatment Gait belt  Activity Tolerance Patient limited by pain  Behavior During Therapy [facility] for tasks assessed/performed     Past Medical History:  Diagnosis Date  . Chronic kidney disease   . History of chicken pox   . History of DVT of lower extremity    right  . HIV (human immunodeficiency virus infection) ([place])   . Hyperlipidemia   . Left hip pain 03/09/2013  . Pneumonia   . Protein malnutrition ([place]) 08/2015    Past Surgical History:  Procedure Laterality Date  . APPENDECTOMY  1962  . LEG AMPUTATION ABOVE KNEE  2010   right leg    There were no vitals filed for this visit.     06/09/16 1020  Symptoms/Limitations  Subjective No new complaints. No falls to report. Has not been wearing prosthesis on consistent basis.  Pertinent History Right AKA, HIV, shingles, DVT, Lt hip pain, CKD, Hep B,   Limitations Lifting;Standing;Walking  Patient Stated Goals He wants to work on walking & "get muscles toned up"  Pain Assessment  Currently in Pain? Yes  Pain Score 3  Pain Location Generalized (all over, mainly shoulders, knee)  Pain Descriptors / Indicators Sore  Pain Type Chronic pain  Pain Onset More than a month ago  Pain Frequency Constant  Aggravating Factors  increased activity, stiffness    Pain Relieving Factors pain medication, rest   Treatment: Issued the following to pt's HEP    Bridging    Slowly raise buttocks from floor, keeping stomach tight. Hold for 5 seconds. Repeat _10___ times per set. Do __2__ sets per session. Do __1-2__ sessions per day.  http://orth.exer.us/1096   Copyright  VHI. All rights reserved.  Knee-to-Chest Stretch: Bilateral    Holding a towel placed behind both knees, pull both knees in to chest until a comfortable stretch is felt in lower back and buttocks. Keep back relaxed. Hold __20_ seconds. Repeat __3_ times per set. Do __1__ sets per session. Do _1-2__ sessions per day.  http://orth.exer.us/128   Copyright  VHI. All rights reserved.  EXTENSION: Supine - Over Bolster (Active - Assistance)    Lie on back, pillow under left leg, right prosthesis in bent position with foot resting on bed. Straighten left knee by pushing knee into pillow and lifting foot up off bed. Hold for 5 seconds. Complete __2_ sets of __10_ repetitions. Perform _1-2__ sessions per day.  Copyright  VHI. All rights reserved.    Knee: Extension    Be sure bottom is flat and back supported (can do this while seated in power chair). Place left foot on stool or another chair. Push gently down on thigh to straighten knee. Do not allow leg to twist. Hold __30__ seconds. Repeat ____ times. Do ____ sessions per day. CAUTION: Do not force joint. Stretch should be gentle  and slow.  Copyright  VHI. All rights reserved.    Sitting With Rotation    Sit on edge of seat with one hand on opposite knee, other on chair. Keep knees parallel. Pull with hand on knee and, if needed, push with hand on chair to rotate trunk to that side. Hold _10__ seconds. Repeat __4_ times per session. Do __1-2_ sessions per day.  Copyright  VHI. All rights reserved.  Half Lean, Sitting    Sit on chair. Lean forward. Hold _20__ seconds. To return, put forearms on knees and  push up. For greater stretch reach arms toward back legs of chair.  Repeat _3__ times per session. Do _1-2_ sessions per day.   Copyright  VHI. All rights reserved.         06/09/16 0109  PT Education  Education provided Yes  Education Details HEP for strengthening and flexibility  Person(s) Educated Patient  Methods Explanation;Demonstration;Verbal cues;Tactile cues;Handout  Comprehension Verbalized understanding;Returned demonstration;Verbal cues required;Tactile cues required;Need further instruction         PT Short Term Goals - 05/31/16 2147      PT SHORT TERM GOAL #1   Title Patient tolerates wear of prosthesis daily >5 hrs without limb pain. (Target Date: 06/30/2016)   Time 4   Period Weeks   Status New     PT SHORT TERM GOAL #2   Title Patient ambulates 200' with RW & prosthesis with supervision. (Target Date: 06/30/2016)   Time 4   Period Weeks   Status New     PT SHORT TERM GOAL #3   Title Patient negotiates ramps, curbs with RW and stairs with 1 rail with prosthesis with supervision. (Target Date: 06/30/2016)   Time 4   Period Weeks   Status New     PT SHORT TERM GOAL #4   Title Patient reaches 10" and to floor with UE support modified independent. (Target Date: 06/30/2016)   Time 4   Period Weeks   Status New     PT SHORT TERM GOAL #5   Title Patient reports left knee pain <6/10 with standing & gait activities. (Target Date: 06/30/2016)   Time 4   Period Weeks   Status New           PT Long Term Goals - 05/31/16 2138      PT LONG TERM GOAL #1   Title Patient tolerates prosthesis wear >80% of awake hours without limb pain or skin issues. (Target Date: 07/28/2016)   Time 8   Period Weeks   Status New     PT LONG TERM GOAL #2   Title Patient ambulates 400' with LRAD & prosthesis modified independent for community mobility. (Target Date: 07/28/2016)   Time 8   Period Weeks   Status New     PT LONG TERM GOAL #3   Title Patient negotiates ramps,  curbs & stairs with LRAD & prosthesis modified independent for community access. (Target Date: 07/28/2016)   Time 8   Period Weeks   Status New     PT LONG TERM GOAL #4   Title Patient ambulates 61' with single crutch or less and prosthesis modified independent for household mobility. (Target Date: 07/28/2016)   Time 8   Period Weeks   Status New     PT LONG TERM GOAL #5   Title Berg Balance >45/56 to indicate lower fall risk. (Target Date: 07/28/2016)   Time 8   Period Weeks   Status New  06/09/16 1022  Plan  Clinical Impression Statement Today's session addressed setting up pt with an HEP for strengthening and flexibility. Pt needed cues on form and technique in session. Pt is progressing toward goals.                    Pt will benefit from skilled therapeutic intervention in order to improve on the following deficits Abnormal gait;Decreased activity tolerance;Decreased balance;Decreased coordination;Decreased mobility;Decreased strength;Difficulty walking;Decreased knowledge of use of DME;Impaired flexibility;Postural dysfunction;Prosthetic Dependency;Pain  Rehab Potential Good  PT Frequency 2x / week  PT Duration 8 weeks  PT Treatment/Interventions ADLs/Self Care Home Management;Moist Heat;Ultrasound;DME Instruction;Gait training;Stair training;Functional mobility training;Therapeutic activities;Therapeutic exercise;Balance training;Neuromuscular re-education;Patient/family education;Prosthetic Training  PT Next Visit Plan prosthetic care including wear, review HEP as needed and advance as indicated, gait/barriers with RW/prosthesis  Consulted and Agree with Plan of Care Patient         Patient will benefit from skilled therapeutic intervention in order to improve the following deficits and impairments:  Abnormal gait, Decreased activity tolerance, Decreased balance, Decreased coordination, Decreased mobility, Decreased strength, Difficulty walking, Decreased knowledge  of use of DME, Impaired flexibility, Postural dysfunction, Prosthetic Dependency, Pain  Visit Diagnosis: Muscle weakness (generalized)  Other abnormalities of gait and mobility  Unsteadiness on feet  Pain in left knee  Difficulty in walking, not elsewhere classified     Problem List Patient Active Problem List   Diagnosis Date Noted  . Long term current use of opiate analgesic 12/17/2015  . Debilitated 09/22/2015  . Renal tubular acidosis   . AIDS ([place])   . History of above knee amputation ([place])   . SBO (small bowel obstruction) ([place])   . Pressure ulcer 09/13/2015  . Protein-calorie malnutrition, severe 09/13/2015  . Polyuria 08/06/2015  . Prediabetes 08/06/2015  . Hematuria 08/06/2015  . UTI (urinary tract infection) 05/14/2015  . Avascular necrosis of femoral head ([place]) 05/14/2015  . Left adrenal mass ([place]) 05/14/2015  . CKD (chronic kidney disease) 02/10/2014  . Hyperglycemia 12/29/2013  . Unintentional weight loss 11/04/2013  . Left hip pain 03/09/2013  . Preventative health care 03/09/2013  . Phantom limb syndrome with pain ([place]) 03/21/2012  . Long term current use of anticoagulant 11/20/2010  . ERECTILE DYSFUNCTION, ORGANIC 04/12/2010  . GERD 08/23/2009  . METHICILLIN RESISTANT STAPHYLOCOCCUS AUREUS INFECTION 08/05/2009  . ENLARGEMENT OF LYMPH NODES 07/20/2009  . 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 HYPOGONADISM 07/15/2009  . DEFICIENCY OF OTHER VITAMINS 07/15/2009  . PULMONARY NODULE 07/15/2009  . Status post above knee amputation ([place]) 07/15/2009  . Human immunodeficiency virus (HIV) disease ([place]) 11/10/2006  . SHINGLES, RECURRENT 11/10/2006  . HEPATITIS B 11/10/2006  . DYSLIPIDEMIA 11/10/2006  . PSYCHOSIS 11/10/2006  . Alcohol abuse 11/10/2006  . CIGARETTE SMOKER 11/10/2006  . PERIPHERAL NEUROPATHY 11/10/2006  . ALLERGIC RHINITIS 11/10/2006  . DENTAL CARIES 11/10/2006    Willow Ora, PTA, [facility] [address] [place], [place] [address] [phone] 06/12/16, 1:08 AM   Name: Denis Coch MRN: [id number] Date of Birth: 01-28-1961

## 2016-06-14 ENCOUNTER — Encounter: Payer: Self-pay | Admitting: Physical Therapy

## 2016-06-14 ENCOUNTER — Ambulatory Visit: Payer: Medicare Other | Admitting: Physical Therapy

## 2016-06-14 DIAGNOSIS — M6281 Muscle weakness (generalized): Secondary | ICD-10-CM

## 2016-06-14 DIAGNOSIS — R262 Difficulty in walking, not elsewhere classified: Secondary | ICD-10-CM | POA: Diagnosis not present

## 2016-06-14 DIAGNOSIS — R2689 Other abnormalities of gait and mobility: Secondary | ICD-10-CM

## 2016-06-14 DIAGNOSIS — R2681 Unsteadiness on feet: Secondary | ICD-10-CM | POA: Diagnosis not present

## 2016-06-14 DIAGNOSIS — M25562 Pain in left knee: Secondary | ICD-10-CM

## 2016-06-15 NOTE — Therapy (Signed)
[facility] [address] [place], [place], [address] Phone: [phone]   Fax:  [phone]  Physical Therapy Treatment  Patient Details  Name: [name] MRN: [id number] Date of Birth: 01-Aug-1961 Referring Provider: Burgess Estelle, MD resident Joni Reining, DO)  Encounter Date: 06/14/2016    06/14/16 0855  PT Visits / Re-Eval  Visit Number 4  Number of Visits 17  Date for PT Re-Evaluation 07/28/16  Authorization  Authorization Type Medicare G-code & progress note  PT Time Calculation  PT Start Time 0848  PT Stop Time 0930  PT Time Calculation (min) 42 min  PT - End of Session  Equipment Utilized During Treatment Gait belt  Activity Tolerance Patient limited by pain  Behavior During Therapy [facility] for tasks assessed/performed     Past Medical History:  Diagnosis Date  . Chronic kidney disease   . History of chicken pox   . History of DVT of lower extremity    right  . HIV (human immunodeficiency virus infection) ([place])   . Hyperlipidemia   . Left hip pain 03/09/2013  . Pneumonia   . Protein malnutrition ([place]) 08/2015    Past Surgical History:  Procedure Laterality Date  . APPENDECTOMY  1962  . LEG AMPUTATION ABOVE KNEE  2010   right leg    There were no vitals filed for this visit.   06/14/16 0853  Symptoms/Limitations  Subjective Has been hurting/sore ever since last visit. Did do the HEP on Saturday which made him more sore. Just starting to ease off today.  Pertinent History Right AKA, HIV, shingles, DVT, Lt hip pain, CKD, Hep B,   Limitations Lifting;Standing;Walking  Patient Stated Goals He wants to work on walking & "get muscles toned up"  Pain Assessment  Currently in Pain? Yes  Pain Score 3  Pain Location Generalized  Pain Descriptors / Indicators Sore  Pain Type Chronic pain  Pain Onset More than a month ago  Aggravating Factors  increased activity, stiffness  Pain Relieving Factors  pain medication, rest      06/14/16 0913  Transfers  Transfers Sit to Stand;Stand to Sit  Sit to Stand 5: Supervision;With upper extremity assist;With armrests;From elevated surface;From chair/3-in-1  Stand to Sit 5: Supervision;With upper extremity assist;With armrests;To chair/3-in-1  Ambulation/Gait  Ambulation/Gait Yes  Ambulation/Gait Assistance 4: Min guard;5: Supervision  Ambulation/Gait Assistance Details smooth, reciprocal steps with decreased step length. cues needed on posture, walker position with gait and for increased left knee/hip flexion with swing phase  Ambulation Distance (Feet) 230 Feet  Assistive device Rolling walker;Prosthesis  Gait Pattern Step-through pattern;Decreased step length - left;Decreased stance time - right;Decreased stride length;Decreased hip/knee flexion - left;Trunk flexed;Abducted- right  Ambulation Surface Level;Indoor  Prosthetics  Current prosthetic wear tolerance (days/week)  daily  Current prosthetic wear tolerance (#hours/day)  most all day, no breaks  Residual limb condition  intact per pt report. does report having a boil on right buttock that has opened up and drained. Also reports the ulcer on sacrum is healing.    Exercises: Pt performed all exercises issued at previous session with handouts and min cues on correct form/ex technique.         PT Short Term Goals - 05/31/16 2147      PT SHORT TERM GOAL #1   Title Patient tolerates wear of prosthesis daily >5 hrs without limb pain. (Target Date: 06/30/2016)   Time 4   Period Weeks   Status New  PT SHORT TERM GOAL #2   Title Patient ambulates 200' with RW & prosthesis with supervision. (Target Date: 06/30/2016)   Time 4   Period Weeks   Status New     PT SHORT TERM GOAL #3   Title Patient negotiates ramps, curbs with RW and stairs with 1 rail with prosthesis with supervision. (Target Date: 06/30/2016)   Time 4   Period Weeks   Status New     PT SHORT TERM GOAL #4   Title  Patient reaches 10" and to floor with UE support modified independent. (Target Date: 06/30/2016)   Time 4   Period Weeks   Status New     PT SHORT TERM GOAL #5   Title Patient reports left knee pain <6/10 with standing & gait activities. (Target Date: 06/30/2016)   Time 4   Period Weeks   Status New           PT Long Term Goals - 05/31/16 2138      PT LONG TERM GOAL #1   Title Patient tolerates prosthesis wear >80% of awake hours without limb pain or skin issues. (Target Date: 07/28/2016)   Time 8   Period Weeks   Status New     PT LONG TERM GOAL #2   Title Patient ambulates 400' with LRAD & prosthesis modified independent for community mobility. (Target Date: 07/28/2016)   Time 8   Period Weeks   Status New     PT LONG TERM GOAL #3   Title Patient negotiates ramps, curbs & stairs with LRAD & prosthesis modified independent for community access. (Target Date: 07/28/2016)   Time 8   Period Weeks   Status New     PT LONG TERM GOAL #4   Title Patient ambulates 1' with single crutch or less and prosthesis modified independent for household mobility. (Target Date: 07/28/2016)   Time 8   Period Weeks   Status New     PT LONG TERM GOAL #5   Title Berg Balance >45/56 to indicate lower fall risk. (Target Date: 07/28/2016)   Time 8   Period Weeks   Status New        06/14/16 0855  Plan  Clinical Impression Statement Today's session continued work on strengthening, stretching and gait with prosthesis. Pt fatigues easily and does have increased pain with most exercises that resolves with rest. Pt is making steady progress toward goals.  Pt will benefit from skilled therapeutic intervention in order to improve on the following deficits Abnormal gait;Decreased activity tolerance;Decreased balance;Decreased coordination;Decreased mobility;Decreased strength;Difficulty walking;Decreased knowledge of use of DME;Impaired flexibility;Postural dysfunction;Prosthetic Dependency;Pain  Rehab  Potential Good  PT Frequency 2x / week  PT Duration 8 weeks  PT Treatment/Interventions ADLs/Self Care Home Management;Moist Heat;Ultrasound;DME Instruction;Gait training;Stair training;Functional mobility training;Therapeutic activities;Therapeutic exercise;Balance training;Neuromuscular re-education;Patient/family education;Prosthetic Training  PT Next Visit Plan prosthetic care including wear, review HEP as needed and advance as indicated, gait/barriers with RW/prosthesis  Consulted and Agree with Plan of Care Patient      Patient will benefit from skilled therapeutic intervention in order to improve the following deficits and impairments:  Abnormal gait, Decreased activity tolerance, Decreased balance, Decreased coordination, Decreased mobility, Decreased strength, Difficulty walking, Decreased knowledge of use of DME, Impaired flexibility, Postural dysfunction, Prosthetic Dependency, Pain  Visit Diagnosis: Muscle weakness (generalized)  Other abnormalities of gait and mobility  Unsteadiness on feet  Pain in left knee  Difficulty in walking, not elsewhere classified     Problem List Patient Active  Problem List   Diagnosis Date Noted  . Long term current use of opiate analgesic 12/17/2015  . Debilitated 09/22/2015  . Renal tubular acidosis   . AIDS ([place])   . History of above knee amputation ([place])   . SBO (small bowel obstruction) ([place])   . Pressure ulcer 09/13/2015  . Protein-calorie malnutrition, severe 09/13/2015  . Polyuria 08/06/2015  . Prediabetes 08/06/2015  . Hematuria 08/06/2015  . UTI (urinary tract infection) 05/14/2015  . Avascular necrosis of femoral head ([place]) 05/14/2015  . Left adrenal mass ([place]) 05/14/2015  . CKD (chronic kidney disease) 02/10/2014  . Hyperglycemia 12/29/2013  . Unintentional weight loss 11/04/2013  . Left hip pain 03/09/2013  . Preventative health care 03/09/2013  . Phantom limb syndrome with pain ([place]) 03/21/2012  . Long term  current use of anticoagulant 11/20/2010  . ERECTILE DYSFUNCTION, ORGANIC 04/12/2010  . GERD 08/23/2009  . METHICILLIN RESISTANT STAPHYLOCOCCUS AUREUS INFECTION 08/05/2009  . ENLARGEMENT OF LYMPH NODES 07/20/2009  . Embolism and thrombosis of artery ([place]) 07/19/2009  . HYPOGONADISM 07/15/2009  . DEFICIENCY OF OTHER VITAMINS 07/15/2009  . PULMONARY NODULE 07/15/2009  . Status post above knee amputation ([place]) 07/15/2009  . Human immunodeficiency virus (HIV) disease ([place]) 11/10/2006  . SHINGLES, RECURRENT 11/10/2006  . HEPATITIS B 11/10/2006  . DYSLIPIDEMIA 11/10/2006  . PSYCHOSIS 11/10/2006  . Alcohol abuse 11/10/2006  . CIGARETTE SMOKER 11/10/2006  . PERIPHERAL NEUROPATHY 11/10/2006  . ALLERGIC RHINITIS 11/10/2006  . DENTAL CARIES 11/10/2006    Willow Ora, PTA, [facility] [address], [place], [place] [address] [phone] 06/15/16, 7:52 PM  Name: Choua Reisman MRN: [id number] Date of Birth: [birth year]

## 2016-06-16 ENCOUNTER — Ambulatory Visit: Payer: Medicare Other | Admitting: Physical Therapy

## 2016-06-16 ENCOUNTER — Encounter: Payer: Self-pay | Admitting: Physical Therapy

## 2016-06-16 DIAGNOSIS — M6281 Muscle weakness (generalized): Secondary | ICD-10-CM | POA: Diagnosis not present

## 2016-06-16 DIAGNOSIS — R2689 Other abnormalities of gait and mobility: Secondary | ICD-10-CM | POA: Diagnosis not present

## 2016-06-16 DIAGNOSIS — R262 Difficulty in walking, not elsewhere classified: Secondary | ICD-10-CM

## 2016-06-16 DIAGNOSIS — R2681 Unsteadiness on feet: Secondary | ICD-10-CM

## 2016-06-16 DIAGNOSIS — M25562 Pain in left knee: Secondary | ICD-10-CM | POA: Diagnosis not present

## 2016-06-17 NOTE — Therapy (Signed)
[facility] [address] [place], [place], [address] Phone: [phone]   Fax:  [phone]  Physical Therapy Treatment  Patient Details  Name: [name] MRN: [id number] Date of Birth: 1961/01/18 Referring Provider: Burgess Estelle, MD resident Joni Reining, DO)  Encounter Date: 06/16/2016   06/16/16 0936  PT Visits / Re-Eval  Visit Number 5  Number of Visits 17  Date for PT Re-Evaluation 07/28/16  Authorization  Authorization Type Medicare G-code & progress note  PT Time Calculation  PT Start Time 0932  PT Stop Time 1015  PT Time Calculation (min) 43 min  PT - End of Session  Equipment Utilized During Treatment Gait belt  Activity Tolerance Patient limited by pain  Behavior During Therapy [facility] for tasks assessed/performed     Past Medical History:  Diagnosis Date  . Chronic kidney disease   . History of chicken pox   . History of DVT of lower extremity    right  . HIV (human immunodeficiency virus infection) ([place])   . Hyperlipidemia   . Left hip pain 03/09/2013  . Pneumonia   . Protein malnutrition ([place]) 08/2015    Past Surgical History:  Procedure Laterality Date  . APPENDECTOMY  1962  . LEG AMPUTATION ABOVE KNEE  2010   right leg    There were no vitals filed for this visit.     06/16/16 0935  Symptoms/Limitations  Subjective No new complaints. Still with increased pain/soreness from exercises and from being really active in heat trying to fix his truck. No falls to report.  Pertinent History Right AKA, HIV, shingles, DVT, Lt hip pain, CKD, Hep B,   Limitations Lifting;Standing;Walking  Patient Stated Goals He wants to work on walking & "get muscles toned up"  Pain Assessment  Currently in Pain? Yes  Pain Score 4  Pain Location Generalized (upper torso and right limb)  Pain Descriptors / Indicators Aching;Sore;Tightness  Pain Type Chronic pain  Pain Onset More than a month ago  Pain  Frequency Constant  Aggravating Factors  increased activity, stiffness  Pain Relieving Factors pain medication, rest  Effect of Pain on Daily Activities standing and gait limited      06/16/16 0937  Transfers  Transfers Sit to Stand;Stand to Sit  Sit to Stand 5: Supervision;With upper extremity assist;With armrests;From elevated surface;From chair/3-in-1  Stand to Sit 5: Supervision;With upper extremity assist;With armrests;To chair/3-in-1  Ambulation/Gait  Ambulation/Gait Yes  Ambulation/Gait Assistance 4: Min guard;5: Supervision  Ambulation/Gait Assistance Details cues for posture, increased bil step length, and for increased left knee/hip flexion with swing phase. Pt over corrected the left hip/knee flexion to almost a "march" with swing phase, therefore cues for decreased flexion for more "natural" knee/hip bend with swing phase.                 Ambulation Distance (Feet) 115 Feet (x1; 230 x1)  Assistive device Rolling walker;Prosthesis  Gait Pattern Step-through pattern;Decreased step length - left;Decreased stance time - right;Decreased stride length;Decreased hip/knee flexion - left;Trunk flexed;Abducted- right  Ambulation Surface Level;Indoor  Ramp Other (comment) (min guard assist with RW/prosthesis)  Ramp Details (indicate cue type and reason) cues on posture, sequencing and technique  Curb Other (comment) (min guard assist with RW/prosthesis)  Curb Details (indicate cue type and reason) cues on sequencing, posture and stance position for balance with walker advancement  Prosthetics  Current prosthetic wear tolerance (days/week)  daily  Current prosthetic wear tolerance (#hours/day)  most all day for  long periods of time when awake  Residual limb condition  limb is intact. Pt reports the boil and sacral wounds are healing with no drainage at this time  Education Provided Residual limb care;Proper wear schedule/adjustment;Proper weight-bearing schedule/adjustment  Person(s)  Educated Patient  Education Method Explanation;Demonstration;Verbal cues  Education Method Verbalized understanding;Needs further instruction  Donning Prosthesis 5  Doffing Prosthesis 5          PT Short Term Goals - 05/31/16 2147      PT SHORT TERM GOAL #1   Title Patient tolerates wear of prosthesis daily >5 hrs without limb pain. (Target Date: 06/30/2016)   Time 4   Period Weeks   Status New     PT SHORT TERM GOAL #2   Title Patient ambulates 200' with RW & prosthesis with supervision. (Target Date: 06/30/2016)   Time 4   Period Weeks   Status New     PT SHORT TERM GOAL #3   Title Patient negotiates ramps, curbs with RW and stairs with 1 rail with prosthesis with supervision. (Target Date: 06/30/2016)   Time 4   Period Weeks   Status New     PT SHORT TERM GOAL #4   Title Patient reaches 10" and to floor with UE support modified independent. (Target Date: 06/30/2016)   Time 4   Period Weeks   Status New     PT SHORT TERM GOAL #5   Title Patient reports left knee pain <6/10 with standing & gait activities. (Target Date: 06/30/2016)   Time 4   Period Weeks   Status New           PT Long Term Goals - 05/31/16 2138      PT LONG TERM GOAL #1   Title Patient tolerates prosthesis wear >80% of awake hours without limb pain or skin issues. (Target Date: 07/28/2016)   Time 8   Period Weeks   Status New     PT LONG TERM GOAL #2   Title Patient ambulates 400' with LRAD & prosthesis modified independent for community mobility. (Target Date: 07/28/2016)   Time 8   Period Weeks   Status New     PT LONG TERM GOAL #3   Title Patient negotiates ramps, curbs & stairs with LRAD & prosthesis modified independent for community access. (Target Date: 07/28/2016)   Time 8   Period Weeks   Status New     PT LONG TERM GOAL #4   Title Patient ambulates 70' with single crutch or less and prosthesis modified independent for household mobility. (Target Date: 07/28/2016)   Time 8   Period  Weeks   Status New     PT LONG TERM GOAL #5   Title Berg Balance >45/56 to indicate lower fall risk. (Target Date: 07/28/2016)   Time 8   Period Weeks   Status New        06/16/16 [id number]  Plan  Clinical Impression Statement Today's session continued to address gait with prosthesis/RW, including barriers. Pt most limited by increased pain with activity which improves with rest breaks. Pt is making slow, steady progress toward goals.   Pt will benefit from skilled therapeutic intervention in order to improve on the following deficits Abnormal gait;Decreased activity tolerance;Decreased balance;Decreased coordination;Decreased mobility;Decreased strength;Difficulty walking;Decreased knowledge of use of DME;Impaired flexibility;Postural dysfunction;Prosthetic Dependency;Pain  Rehab Potential Good  PT Frequency 2x / week  PT Duration 8 weeks  PT Treatment/Interventions ADLs/Self Care Home Management;Moist Heat;Ultrasound;DME Instruction;Gait training;Stair training;Functional mobility training;Therapeutic activities;Therapeutic  exercise;Balance training;Neuromuscular re-education;Patient/family education;Prosthetic Training  PT Next Visit Plan prosthetic care including wear, review HEP as needed and advance as indicated, gait/barriers with RW/prosthesis  Consulted and Agree with Plan of Care Patient      Patient will benefit from skilled therapeutic intervention in order to improve the following deficits and impairments:  Abnormal gait, Decreased activity tolerance, Decreased balance, Decreased coordination, Decreased mobility, Decreased strength, Difficulty walking, Decreased knowledge of use of DME, Impaired flexibility, Postural dysfunction, Prosthetic Dependency, Pain  Visit Diagnosis: Muscle weakness (generalized)  Other abnormalities of gait and mobility  Unsteadiness on feet  Pain in left knee  Difficulty in walking, not elsewhere classified     Problem List Patient Active  Problem List   Diagnosis Date Noted  . Long term current use of opiate analgesic 12/17/2015  . Debilitated 09/22/2015  . Renal tubular acidosis   . AIDS ([place])   . History of above knee amputation ([place])   . SBO (small bowel obstruction) ([place])   . Pressure ulcer 09/13/2015  . Protein-calorie malnutrition, severe 09/13/2015  . Polyuria 08/06/2015  . Prediabetes 08/06/2015  . Hematuria 08/06/2015  . UTI (urinary tract infection) 05/14/2015  . Avascular necrosis of femoral head ([place]) 05/14/2015  . Left adrenal mass ([place]) 05/14/2015  . CKD (chronic kidney disease) 02/10/2014  . Hyperglycemia 12/29/2013  . Unintentional weight loss 11/04/2013  . Left hip pain 03/09/2013  . Preventative health care 03/09/2013  . Phantom limb syndrome with pain ([place]) 03/21/2012  . Long term current use of anticoagulant 11/20/2010  . ERECTILE DYSFUNCTION, ORGANIC 04/12/2010  . GERD 08/23/2009  . METHICILLIN RESISTANT STAPHYLOCOCCUS AUREUS INFECTION 08/05/2009  . ENLARGEMENT OF LYMPH NODES 07/20/2009  . Embolism and thrombosis of artery ([place]) 07/19/2009  . HYPOGONADISM 07/15/2009  . DEFICIENCY OF OTHER VITAMINS 07/15/2009  . PULMONARY NODULE 07/15/2009  . Status post above knee amputation ([place]) 07/15/2009  . Human immunodeficiency virus (HIV) disease ([place]) 11/10/2006  . SHINGLES, RECURRENT 11/10/2006  . HEPATITIS B 11/10/2006  . DYSLIPIDEMIA 11/10/2006  . PSYCHOSIS 11/10/2006  . Alcohol abuse 11/10/2006  . CIGARETTE SMOKER 11/10/2006  . PERIPHERAL NEUROPATHY 11/10/2006  . ALLERGIC RHINITIS 11/10/2006  . DENTAL CARIES 11/10/2006    Willow Ora, PTA, [facility] [address], [place],  [address] [phone] 06/17/16, 7:45 PM   Name: Nathanuel Jude MRN: [id number] Date of Birth: Mar 24, 1961

## 2016-06-18 ENCOUNTER — Other Ambulatory Visit: Payer: Self-pay | Admitting: Internal Medicine

## 2016-06-18 DIAGNOSIS — G629 Polyneuropathy, unspecified: Secondary | ICD-10-CM

## 2016-06-19 ENCOUNTER — Other Ambulatory Visit: Payer: Self-pay

## 2016-06-19 DIAGNOSIS — G546 Phantom limb syndrome with pain: Secondary | ICD-10-CM

## 2016-06-19 DIAGNOSIS — G8929 Other chronic pain: Secondary | ICD-10-CM

## 2016-06-19 MED ORDER — MORPHINE SULFATE ER 30 MG PO TBCR: 90.0000 mg | EXTENDED_RELEASE_TABLET | Freq: Three times a day (TID) | ORAL | 0 refills | Status: DC

## 2016-06-19 MED ORDER — OXYCODONE HCL 5 MG PO TABS: 5.0000 mg | ORAL_TABLET | Freq: Two times a day (BID) | ORAL | 0 refills | Status: DC | PRN

## 2016-06-20 ENCOUNTER — Other Ambulatory Visit: Payer: Self-pay | Admitting: Pharmacist

## 2016-06-20 ENCOUNTER — Encounter: Payer: Self-pay | Admitting: Pharmacist

## 2016-06-20 NOTE — Progress Notes (Signed)
Patient ID: [name], male   DOB: Aug 28, 1961, 55 y.o.   MRN: [id number]  Francisca walked in and asked to see me. He is having issues with filling his MS Contin 90 mg q8h. He went to [facility] and they said they could not fill it.  Called [facility] and they require a prior auth. We are working on the prior [place] today.  He is out of medications, so Dr. Baxter Flattery wrote a prescription for MS Contin 30 mg - take 2 tablets (60 mg total) q8h x 12 pills to last him 2 days while we get this figured out since that was previously filled without the need for prior auth.  He left with both Rx's (one from Dr. Megan Salon and one from Dr. Baxter Flattery) and went back to [facility].   Abie Killian L. Nicole Kindred, PharmD Infectious Diseases Clinical Pharmacist Pager: [phone] 06/20/2016 1:11 PM

## 2016-06-21 ENCOUNTER — Encounter: Payer: Self-pay | Admitting: Physical Therapy

## 2016-06-21 ENCOUNTER — Ambulatory Visit: Payer: Medicare Other | Admitting: Physical Therapy

## 2016-06-21 DIAGNOSIS — M6281 Muscle weakness (generalized): Secondary | ICD-10-CM

## 2016-06-21 DIAGNOSIS — M25562 Pain in left knee: Secondary | ICD-10-CM | POA: Diagnosis not present

## 2016-06-21 DIAGNOSIS — R2689 Other abnormalities of gait and mobility: Secondary | ICD-10-CM

## 2016-06-21 DIAGNOSIS — R2681 Unsteadiness on feet: Secondary | ICD-10-CM

## 2016-06-21 DIAGNOSIS — R262 Difficulty in walking, not elsewhere classified: Secondary | ICD-10-CM | POA: Diagnosis not present

## 2016-06-21 NOTE — Therapy (Signed)
[facility] [address] [place], [place], [address] Phone: [phone]   Fax:  [phone]  Physical Therapy Treatment  Patient Details  Name: [name] MRN: [id number] Date of Birth: [birth year] Referring Provider: Burgess Estelle, MD resident Joni Reining, DO)  Encounter Date: 06/21/2016      PT End of Session - 06/21/16 1557    Visit Number 6   Number of Visits 17   Date for PT Re-Evaluation 07/28/16   Authorization Type Medicare G-code & progress note   PT Start Time 0845   PT Stop Time 0929   PT Time Calculation (min) 44 min   Equipment Utilized During Treatment Gait belt   Activity Tolerance Patient limited by pain   Behavior During Therapy [facility] for tasks assessed/performed      Past Medical History:  Diagnosis Date  . Chronic kidney disease   . History of chicken pox   . History of DVT of lower extremity    right  . HIV (human immunodeficiency virus infection) ([place])   . Hyperlipidemia   . Left hip pain 03/09/2013  . Pneumonia   . Protein malnutrition ([place]) 08/2015    Past Surgical History:  Procedure Laterality Date  . APPENDECTOMY  1962  . LEG AMPUTATION ABOVE KNEE  2010   right leg    There were no vitals filed for this visit.      Subjective Assessment - 06/21/16 0851    Subjective He is wearing prosthesis daily as advised but it gets tight so he has to push prosthesis down to relief pressure in sitting.    Pertinent History Right AKA, HIV, shingles, DVT, Lt hip pain, CKD, Hep B,    Limitations Lifting;Standing;Walking   Patient Stated Goals He wants to work on walking & "get muscles toned up"   Currently in Pain? Yes   Pain Score 3    Pain Location Knee   Pain Orientation Anterior   Pain Descriptors / Indicators Sharp   Pain Type Chronic pain   Pain Onset More than a month ago   Pain Frequency Intermittent   Aggravating Factors  moving knee especially letting it down (eccentric)    Pain Relieving Factors pain med, rest                         [facility] Adult PT Treatment/Exercise - 06/21/16 0845      Transfers   Transfers Sit to Stand;Stand to Sit   Sit to Stand 5: Supervision;With upper extremity assist;With armrests;From elevated surface;From chair/3-in-1  from power w/c and progressed to 29" bar stool w/light UE   Sit to Stand Details (indicate cue type and reason) verbal, tactile & manual cues on weight shift   Stand to Sit 5: Supervision;With upper extremity assist;With armrests;To chair/3-in-1  from power w/c and progressed to 29" bar stool w/light UE   Stand to Sit Details verbal & tactile cues on technique     Ambulation/Gait   Ambulation/Gait Yes   Ambulation/Gait Assistance 5: Supervision;4: Min assist   Ambulation/Gait Assistance Details cues on upright posture to decrease weight bearing thru UEs and step length.  Progressed to gait in parallel bars with LUE support only with minA with manual & verbal cues on weight shift and posture   Ambulation Distance (Feet) 250 Feet  250' with RW & 10' with single parralel bar   Assistive device Rolling walker;Prosthesis;Parallel bars  left parallel bar only   Gait Pattern  Step-through pattern;Decreased step length - left;Decreased stance time - right;Decreased stride length;Decreased hip/knee flexion - left;Trunk flexed;Abducted- right   Ambulation Surface Indoor;Level   Ramp Other (comment)  min guard assist with RW/prosthesis   Ramp Details (indicate cue type and reason) cues on technique including posture & weight shift.   Curb 4: Min assist;Other (comment)  min guard w/ RW/prosthesis & minA to lift RLE to step   Curb Details (indicate cue type and reason) cues on sequence, prosthesis control and weight shift     Neuro Re-ed    Neuro Re-ed Details  In parallel bars for safety, light to intermittent UE support and mirror for visual feedback: standing upright with red theraband reciprocal then  bilateral UE forward reach, biceps curls and rowing; left LE knee flexion/extension control with red theraband posterior & anterior; sit to stand from 29" with minimizing use of UEs including stabilization     Exercises   Exercises Knee/Hip     Knee/Hip Exercises: Aerobic   Nustep Sci Fit recumbent stepper Level 1 with BUE & BLE with focus on full left knee ROM     Prosthetics   Current prosthetic wear tolerance (days/week)  daily   Current prosthetic wear tolerance (#hours/day)  most all day for long periods of time when awake   Residual limb condition  limb is intact. Pt reports the boil and sacral wounds are healing with no drainage at this time   Education Provided Residual limb care;Proper wear schedule/adjustment;Proper weight-bearing schedule/adjustment   Person(s) Educated Patient   Education Method Explanation;Verbal cues   Education Method Verbalized understanding;Verbal cues required;Needs further instruction                  PT Short Term Goals - 06/21/16 1558      PT SHORT TERM GOAL #1   Title Patient tolerates wear of prosthesis daily >5 hrs without limb pain. (Target Date: 06/30/2016)   Time 4   Period Weeks   Status On-going     PT SHORT TERM GOAL #2   Title Patient ambulates 200' with RW & prosthesis with supervision. (Target Date: 06/30/2016)   Time 4   Period Weeks   Status On-going     PT SHORT TERM GOAL #3   Title Patient negotiates ramps, curbs with RW and stairs with 1 rail with prosthesis with supervision. (Target Date: 06/30/2016)   Time 4   Period Weeks   Status On-going     PT SHORT TERM GOAL #4   Title Patient reaches 10" and to floor with UE support modified independent. (Target Date: 06/30/2016)   Time 4   Period Weeks   Status On-going     PT SHORT TERM GOAL #5   Title Patient reports left knee pain <6/10 with standing & gait activities. (Target Date: 06/30/2016)   Time 4   Period Weeks   Status On-going           PT Long Term  Goals - 06/21/16 1558      PT LONG TERM GOAL #1   Title Patient tolerates prosthesis wear >80% of awake hours without limb pain or skin issues. (Target Date: 07/28/2016)   Time 8   Period Weeks   Status On-going     PT LONG TERM GOAL #2   Title Patient ambulates 400' with LRAD & prosthesis modified independent for community mobility. (Target Date: 07/28/2016)   Time 8   Period Weeks   Status On-going     PT LONG  TERM GOAL #3   Title Patient negotiates ramps, curbs & stairs with LRAD & prosthesis modified independent for community access. (Target Date: 07/28/2016)   Time 8   Period Weeks   Status On-going     PT LONG TERM GOAL #4   Title Patient ambulates 86' with single crutch or less and prosthesis modified independent for household mobility. (Target Date: 07/28/2016)   Time 8   Period Weeks   Status On-going     PT LONG TERM GOAL #5   Title Berg Balance >45/56 to indicate lower fall risk. (Target Date: 07/28/2016)   Time 8   Period Weeks   Status On-going               Plan - 06/21/16 1558    Clinical Impression Statement PT worked on balance & stabilization with LEs in standing with cues on posture & balance reactions. Pt was able to incrase left knee extension in standing with skilled care.    Rehab Potential Good   PT Frequency 2x / week   PT Duration 8 weeks   PT Treatment/Interventions ADLs/Self Care Home Management;Moist Heat;Ultrasound;DME Instruction;Gait training;Stair training;Functional mobility training;Therapeutic activities;Therapeutic exercise;Balance training;Neuromuscular re-education;Patient/family education;Prosthetic Training   PT Next Visit Plan prosthetic care including wear, review HEP as needed and advance as indicated, gait/barriers with RW/prosthesis   Consulted and Agree with Plan of Care Patient      Patient will benefit from skilled therapeutic intervention in order to improve the following deficits and impairments:  Abnormal gait,  Decreased activity tolerance, Decreased balance, Decreased coordination, Decreased mobility, Decreased strength, Difficulty walking, Decreased knowledge of use of DME, Impaired flexibility, Postural dysfunction, Prosthetic Dependency, Pain  Visit Diagnosis: Muscle weakness (generalized)  Other abnormalities of gait and mobility  Unsteadiness on feet  Pain in left knee     Problem List Patient Active Problem List   Diagnosis Date Noted  . Long term current use of opiate analgesic 12/17/2015  . Debilitated 09/22/2015  . Renal tubular acidosis   . AIDS ([place])   . History of above knee amputation ([place])   . SBO (small bowel obstruction) ([place])   . Pressure ulcer 09/13/2015  . Protein-calorie malnutrition, severe 09/13/2015  . Polyuria 08/06/2015  . Prediabetes 08/06/2015  . Hematuria 08/06/2015  . UTI (urinary tract infection) 05/14/2015  . Avascular necrosis of femoral head ([place]) 05/14/2015  . Left adrenal mass ([place]) 05/14/2015  . CKD (chronic kidney disease) 02/10/2014  . Hyperglycemia 12/29/2013  . Unintentional weight loss 11/04/2013  . Left hip pain 03/09/2013  . Preventative health care 03/09/2013  . Phantom limb syndrome with pain ([place]) 03/21/2012  . Long term current use of anticoagulant 11/20/2010  . ERECTILE DYSFUNCTION, ORGANIC 04/12/2010  . GERD 08/23/2009  . METHICILLIN RESISTANT STAPHYLOCOCCUS AUREUS INFECTION 08/05/2009  . ENLARGEMENT OF LYMPH NODES 07/20/2009  . Embolism and thrombosis of artery ([place]) 07/19/2009  . HYPOGONADISM 07/15/2009  . DEFICIENCY OF OTHER VITAMINS 07/15/2009  . PULMONARY NODULE 07/15/2009  . Status post above knee amputation ([place]) 07/15/2009  . Human immunodeficiency virus (HIV) disease ([place]) 11/10/2006  . SHINGLES, RECURRENT 11/10/2006  . HEPATITIS B 11/10/2006  . DYSLIPIDEMIA 11/10/2006  . PSYCHOSIS 11/10/2006  . Alcohol abuse 11/10/2006  . CIGARETTE SMOKER 11/10/2006  . PERIPHERAL NEUROPATHY 11/10/2006  . ALLERGIC RHINITIS  11/10/2006  . DENTAL CARIES 11/10/2006    Jamey Reas PT, DPT 06/21/2016, 4:03 PM  [facility] [address] [place], [place], [address] Phone: [phone]  Fax:  [phone]  Name: Geraldo Queiroz MRN: [id number] Date of Birth: April 19, 1961

## 2016-06-23 ENCOUNTER — Ambulatory Visit: Payer: Medicare Other | Admitting: Physical Therapy

## 2016-06-23 ENCOUNTER — Encounter: Payer: Self-pay | Admitting: Physical Therapy

## 2016-06-23 ENCOUNTER — Telehealth: Payer: Self-pay

## 2016-06-23 DIAGNOSIS — R262 Difficulty in walking, not elsewhere classified: Secondary | ICD-10-CM | POA: Diagnosis not present

## 2016-06-23 DIAGNOSIS — M6281 Muscle weakness (generalized): Secondary | ICD-10-CM | POA: Diagnosis not present

## 2016-06-23 DIAGNOSIS — R2689 Other abnormalities of gait and mobility: Secondary | ICD-10-CM | POA: Diagnosis not present

## 2016-06-23 DIAGNOSIS — R2681 Unsteadiness on feet: Secondary | ICD-10-CM

## 2016-06-23 DIAGNOSIS — M25562 Pain in left knee: Secondary | ICD-10-CM | POA: Diagnosis not present

## 2016-06-23 NOTE — Telephone Encounter (Signed)
Called [name] back regarding his MS Contin. Informed him that his pharmacy had to order the full amount and there was no other issue with the prescription- insurance approved the increased dose. He will go pick up his prescription today.

## 2016-06-25 NOTE — Therapy (Signed)
[facility] [name] [place], [place], [address] Phone: [phone]   Fax:  [phone]  Physical Therapy Treatment  Patient Details  Name: [name] MRN: [id number] Date of Birth: April 09, 1961 Referring Provider: Burgess Estelle, MD resident Joni Reining, [place])  Encounter Date: 06/23/2016   06/23/16 0936  PT Visits / Re-Eval  Visit Number 7  Number of Visits 17  Date for PT Re-Evaluation 07/28/16  Authorization  Authorization Type Medicare G-code & progress note  PT Time Calculation  PT Start Time 0932  PT Stop Time 1015  PT Time Calculation (min) 43 min  PT - End of Session  Equipment Utilized During Treatment Gait belt  Activity Tolerance Patient limited by pain  Behavior During Therapy [facility] for tasks assessed/performed     Past Medical History:  Diagnosis Date  . Chronic kidney disease   . History of chicken pox   . History of DVT of lower extremity    right  . HIV (human immunodeficiency virus infection) ([place])   . Hyperlipidemia   . Left hip pain 03/09/2013  . Pneumonia   . Protein malnutrition ([place]) 08/2015    Past Surgical History:  Procedure Laterality Date  . APPENDECTOMY  1962  . LEG AMPUTATION ABOVE KNEE  2010   right leg    There were no vitals filed for this visit.   06/23/16 0933  Symptoms/Limitations  Subjective Reports he is waiting on a phone call from the pharmacy to see if his pain meds have been approved as he is completely out. Hurting today with increased pain since his last visit. No falls.   Pertinent History Right AKA, HIV, shingles, DVT, Lt hip pain, CKD, Hep B,   Limitations Lifting;Standing;Walking  Patient Stated Goals He wants to work on walking & "get muscles toned up"  Pain Assessment  Currently in Pain? Yes  Pain Score 6  Pain Location Generalized (shoulders, knee, hips, back)  Pain Descriptors / Indicators Sharp;Sore ("I'm barely making it")  Pain Type  Chronic pain  Pain Onset More than a month ago  Pain Frequency Intermittent  Aggravating Factors  movement  Pain Relieving Factors rest, pain med's      06/23/16 0937  Transfers  Transfers Sit to Stand;Stand to Sit  Sit to Stand 5: Supervision;With upper extremity assist;With armrests;From elevated surface;From chair/3-in-1  Sit to Stand Details Verbal cues for sequencing;Verbal cues for technique;Verbal cues for precautions/safety;Verbal cues for safe use of DME/AE  Sit to Stand Details (indicate cue type and reason) cues for bil foot placement, weight shifting and hand placement  Stand to Sit 5: Supervision;With upper extremity assist;With armrests;To chair/3-in-1  Stand to Sit Details (indicate cue type and reason) Verbal cues for sequencing;Verbal cues for technique;Verbal cues for safe use of DME/AE;Verbal cues for precautions/safety  Stand to Sit Details cues to reach back and to align fully with surface prior to sitting down  Ambulation/Gait  Ambulation/Gait Yes  Ambulation/Gait Assistance 5: Supervision  Ambulation/Gait Assistance Details cues on posture, walker position with gait, for increased left knee flexion with swing phase and for decreased UE weight bearing on walker  Ambulation Distance (Feet) 200 Feet (x1, plus short distances for barrier negotiation/scifit)  Assistive device Rolling walker;Prosthesis  Gait Pattern Step-through pattern;Decreased step length - left;Decreased stance time - right;Decreased stride length;Decreased hip/knee flexion - left;Trunk flexed;Abducted- right  Ambulation Surface Level;Indoor  Ramp Other (comment) (min guard assist with walker and prosthesis)  Ramp Details (indicate cue type and reason) cues  on sequencing and technique  Curb 4: Min assist;Other (comment) (min guard assist to descend)  Curb Details (indicate cue type and reason) cues on sequencing and assist to power up with ascending  Knee/Hip Exercises: Aerobic  Nustep Sci Fit  recumbent stepper Level 2.5  with BUE & BLE with focus on full left knee ROM x 10 minutes  Prosthetics  Current prosthetic wear tolerance (days/week)  daily  Current prosthetic wear tolerance (#hours/day)  most of the day with frequent breaks due to tightness/pressure in socket  Residual limb condition  limb is intact. Pt reports the boil and sacral wounds are healing with no drainage at this time  Education Provided Residual limb care;Proper wear schedule/adjustment;Proper weight-bearing schedule/adjustment  Person(s) Educated Patient  Education Method Explanation;Demonstration;Verbal cues  Education Method Verbalized understanding;Verbal cues required;Returned demonstration  Donning Prosthesis 5  Doffing Prosthesis 5          PT Short Term Goals - 06/21/16 1558      PT SHORT TERM GOAL #1   Title Patient tolerates wear of prosthesis daily >5 hrs without limb pain. (Target Date: 06/30/2016)   Time 4   Period Weeks   Status On-going     PT SHORT TERM GOAL #2   Title Patient ambulates 200' with RW & prosthesis with supervision. (Target Date: 06/30/2016)   Time 4   Period Weeks   Status On-going     PT SHORT TERM GOAL #3   Title Patient negotiates ramps, curbs with RW and stairs with 1 rail with prosthesis with supervision. (Target Date: 06/30/2016)   Time 4   Period Weeks   Status On-going     PT SHORT TERM GOAL #4   Title Patient reaches 10" and to floor with UE support modified independent. (Target Date: 06/30/2016)   Time 4   Period Weeks   Status On-going     PT SHORT TERM GOAL #5   Title Patient reports left knee pain <6/10 with standing & gait activities. (Target Date: 06/30/2016)   Time 4   Period Weeks   Status On-going           PT Long Term Goals - 06/21/16 1558      PT LONG TERM GOAL #1   Title Patient tolerates prosthesis wear >80% of awake hours without limb pain or skin issues. (Target Date: 07/28/2016)   Time 8   Period Weeks   Status On-going     PT  LONG TERM GOAL #2   Title Patient ambulates 400' with LRAD & prosthesis modified independent for community mobility. (Target Date: 07/28/2016)   Time 8   Period Weeks   Status On-going     PT LONG TERM GOAL #3   Title Patient negotiates ramps, curbs & stairs with LRAD & prosthesis modified independent for community access. (Target Date: 07/28/2016)   Time 8   Period Weeks   Status On-going     PT LONG TERM GOAL #4   Title Patient ambulates 48' with single crutch or less and prosthesis modified independent for household mobility. (Target Date: 07/28/2016)   Time 8   Period Weeks   Status On-going     PT LONG TERM GOAL #5   Title Berg Balance >45/56 to indicate lower fall risk. (Target Date: 07/28/2016)   Time 8   Period Weeks   Status On-going        06/23/16 [id number]  Plan  Clinical Impression Statement Today's skilled session continued to work on mobility with prosthesis  and on LE strengthening/ROM. Limited today due to increased pain after previous session and pt out of pain medication. Pt is making slow progress toward goals.  Pt will benefit from skilled therapeutic intervention in order to improve on the following deficits Abnormal gait;Decreased activity tolerance;Decreased balance;Decreased coordination;Decreased mobility;Decreased strength;Difficulty walking;Decreased knowledge of use of DME;Impaired flexibility;Postural dysfunction;Prosthetic Dependency;Pain  Rehab Potential Good  PT Frequency 2x / week  PT Duration 8 weeks  PT Treatment/Interventions ADLs/Self Care Home Management;Moist Heat;Ultrasound;DME Instruction;Gait training;Stair training;Functional mobility training;Therapeutic activities;Therapeutic exercise;Balance training;Neuromuscular re-education;Patient/family education;Prosthetic Training  PT Next Visit Plan prosthetic care including wear, review HEP as needed and advance as indicated, gait/barriers with RW/prosthesis  Consulted and Agree with Plan of Care  Patient          Patient will benefit from skilled therapeutic intervention in order to improve the following deficits and impairments:  Abnormal gait, Decreased activity tolerance, Decreased balance, Decreased coordination, Decreased mobility, Decreased strength, Difficulty walking, Decreased knowledge of use of DME, Impaired flexibility, Postural dysfunction, Prosthetic Dependency, Pain  Visit Diagnosis: Muscle weakness (generalized)  Other abnormalities of gait and mobility  Unsteadiness on feet  Pain in left knee  Difficulty in walking, not elsewhere classified     Problem List Patient Active Problem List   Diagnosis Date Noted  . Long term current use of opiate analgesic 12/17/2015  . Debilitated 09/22/2015  . Renal tubular acidosis   . AIDS ([place])   . History of above knee amputation ([place])   . SBO (small bowel obstruction) ([place])   . Pressure ulcer 09/13/2015  . Protein-calorie malnutrition, severe 09/13/2015  . Polyuria 08/06/2015  . Prediabetes 08/06/2015  . Hematuria 08/06/2015  . UTI (urinary tract infection) 05/14/2015  . Avascular necrosis of femoral head ([place]) 05/14/2015  . Left adrenal mass ([place]) 05/14/2015  . CKD (chronic kidney disease) 02/10/2014  . Hyperglycemia 12/29/2013  . Unintentional weight loss 11/04/2013  . Left hip pain 03/09/2013  . Preventative health care 03/09/2013  . Phantom limb syndrome with pain ([place]) 03/21/2012  . Long term current use of anticoagulant 11/20/2010  . ERECTILE DYSFUNCTION, ORGANIC 04/12/2010  . GERD 08/23/2009  . METHICILLIN RESISTANT STAPHYLOCOCCUS AUREUS INFECTION 08/05/2009  . ENLARGEMENT OF LYMPH NODES 07/20/2009  . Embolism and thrombosis of artery ([place]) 07/19/2009  . HYPOGONADISM 07/15/2009  . DEFICIENCY OF OTHER VITAMINS 07/15/2009  . PULMONARY NODULE 07/15/2009  . Status post above knee amputation ([place]) 07/15/2009  . Human immunodeficiency virus (HIV) disease ([place]) 11/10/2006  . SHINGLES, RECURRENT  11/10/2006  . HEPATITIS B 11/10/2006  . DYSLIPIDEMIA 11/10/2006  . PSYCHOSIS 11/10/2006  . Alcohol abuse 11/10/2006  . CIGARETTE SMOKER 11/10/2006  . PERIPHERAL NEUROPATHY 11/10/2006  . ALLERGIC RHINITIS 11/10/2006  . DENTAL CARIES 11/10/2006   Willow Ora, PTA, [facility] [address] [place], [place] [address] [phone] 06/25/16, 8:53 PM   Name: Pual Cheadle MRN: [id number] Date of Birth: 02-08-1961

## 2016-06-26 ENCOUNTER — Ambulatory Visit (INDEPENDENT_AMBULATORY_CARE_PROVIDER_SITE_OTHER): Payer: Medicare Other | Admitting: Pharmacist

## 2016-06-26 DIAGNOSIS — I749 Embolism and thrombosis of unspecified artery: Secondary | ICD-10-CM | POA: Diagnosis not present

## 2016-06-26 DIAGNOSIS — Z7901 Long term (current) use of anticoagulants: Secondary | ICD-10-CM

## 2016-06-26 LAB — POCT INR: INR: 2.5

## 2016-06-26 NOTE — Progress Notes (Signed)
Anti-Coagulation Progress Note  [name] is a 55 y.o. male who is currently on an anti-coagulation regimen.    RECENT RESULTS: Recent results are below, the most recent result is correlated with a dose of 37.5 mg. per week: Lab Results  Component Value Date   INR 2.50 06/26/2016   INR 2.0 05/29/2016   INR 2.50 04/24/2016   PROTIME 38.4 (H) 04/01/2012    ANTI-COAG DOSE: Anticoagulation Dose Instructions as of 06/26/2016      [name] Tue Wed Thu Fri Sat   New Dose 5 mg 7.5 mg 5 mg 5 mg 5 mg 5 mg 5 mg       ANTICOAG SUMMARY: Anticoagulation Episode Summary    Current INR goal:   2.0-3.0  TTR:   62.4 % (5.4 y)  Next INR check:   07/24/2016  INR from last check:   2.50 (06/26/2016)  Weekly max dose:     Target end date:   Indefinite  [facility] check location:   [facility]  Preferred lab:     Send INR reminders to:   ANTICOAG IMP   Indications   Embolism and thrombosis of artery ([place]) [I74.9] Long term current use of anticoagulant [Z79.01]       Comments:         Anticoagulation Care Providers    Provider Role Specialty Phone number   Michel Bickers, MD  Infectious Diseases [phone]      ANTICOAG TODAY: Anticoagulation Summary  As of 06/26/2016   INR goal:   2.0-3.0  TTR:     Today's INR:   2.50  Next INR check:   07/24/2016  Target end date:   Indefinite   Indications   Embolism and thrombosis of artery ([place]) [I74.9] Long term current use of anticoagulant [Z79.01]        Anticoagulation Episode Summary    IN[facility] check location[facility]   [facility]   Preferred lab:      Send INR reminders to:   ANTICOAG IMP   Comments:       Anticoagulation Care Providers    Provider Role Specialty Phone number   Michel Bickers, MD  Infectious Diseases [phone]      PATIENT INSTRUCTIONS: There are no Patient Instructions on file for this visit.   FOLLOW-UP Return in 4 weeks (on 07/24/2016) for Follow up INR at 1015h.  Jorene Guest, [name] Pharm.D., CACP

## 2016-06-26 NOTE — Patient Instructions (Signed)
Patient instructed to take medications as defined in the Anti-coagulation Track section of this encounter.  Patient instructed to take today's dose.  Patient verbalized understanding of these instructions.    

## 2016-06-28 ENCOUNTER — Encounter: Payer: Self-pay | Admitting: Physical Therapy

## 2016-06-28 ENCOUNTER — Ambulatory Visit: Payer: Medicare Other | Admitting: Physical Therapy

## 2016-06-28 DIAGNOSIS — M6281 Muscle weakness (generalized): Secondary | ICD-10-CM

## 2016-06-28 DIAGNOSIS — R2689 Other abnormalities of gait and mobility: Secondary | ICD-10-CM | POA: Diagnosis not present

## 2016-06-28 DIAGNOSIS — M25562 Pain in left knee: Secondary | ICD-10-CM | POA: Diagnosis not present

## 2016-06-28 DIAGNOSIS — R262 Difficulty in walking, not elsewhere classified: Secondary | ICD-10-CM

## 2016-06-28 DIAGNOSIS — R2681 Unsteadiness on feet: Secondary | ICD-10-CM

## 2016-06-28 NOTE — Therapy (Signed)
[facility] [address] [place], [place], [address] Phone: [phone]   Fax:  [phone]  Physical Therapy Treatment  Patient Details  Name: [name] MRN: [id number] Date of Birth: [birth year] Referring Provider: Burgess Estelle, MD resident Joni Reining, DO)  Encounter Date: 06/28/2016      PT End of Session - 06/28/16 1254    Visit Number 8   Number of Visits 17   Date for PT Re-Evaluation 07/28/16   Authorization Type Medicare G-code & progress note   PT Start Time 0846   PT Stop Time 0930   PT Time Calculation (min) 44 min   Equipment Utilized During Treatment Gait belt   Activity Tolerance Patient limited by pain   Behavior During Therapy [facility] for tasks assessed/performed      Past Medical History:  Diagnosis Date  . Chronic kidney disease   . History of chicken pox   . History of DVT of lower extremity    right  . HIV (human immunodeficiency virus infection) ([place])   . Hyperlipidemia   . Left hip pain 03/09/2013  . Pneumonia   . Protein malnutrition ([place]) 08/2015    Past Surgical History:  Procedure Laterality Date  . APPENDECTOMY  1962  . LEG AMPUTATION ABOVE KNEE  2010   right leg    There were no vitals filed for this visit.      Subjective Assessment - 06/28/16 0851    Subjective He is wearing prosthesis from arising around 7 or 8 until 2-3pm when needs to lay down for pain management. He does not put prosthesis back on limb when he gets up.    Pertinent History Right AKA, HIV, shingles, DVT, Lt hip pain, CKD, Hep B,    Limitations Lifting;Standing;Walking   Patient Stated Goals He wants to work on walking & "get muscles toned up"   Currently in Pain? Yes   Pain Score 4   has pain medications in system   Pain Location Other (Comment)  generalized in shoulders & back   Pain Descriptors / Indicators Sharp;Sore   Pain Type Chronic pain   Pain Onset More than a month ago   Pain  Frequency Intermittent   Aggravating Factors  movements   Pain Relieving Factors pain medications,    Multiple Pain Sites Yes   Pain Score 1   Pain Location Knee   Pain Orientation Left   Pain Descriptors / Indicators Aching   Pain Type Chronic pain   Pain Onset More than a month ago   Pain Frequency Intermittent   Aggravating Factors  movement    Pain Relieving Factors medication & proper exercise                         [facility] Adult PT Treatment/Exercise - 06/28/16 0845      Transfers   Transfers Sit to Stand;Stand to Sit   Sit to Stand 5: Supervision;With upper extremity assist;With armrests;From chair/3-in-1   Sit to Stand Details --   Stand to Sit 5: Supervision;With upper extremity assist;With armrests;To chair/3-in-1   Stand to Sit Details (indicate cue type and reason) --     Ambulation/Gait   Ambulation/Gait Yes   Ambulation/Gait Assistance 5: Supervision   Ambulation/Gait Assistance Details cues on step length, posture & wt shift   Ambulation Distance (Feet) 200 Feet  x1, plus short distances for barrier negotiation/scifit   Assistive device Rolling walker;Prosthesis   Gait Pattern Step-through  pattern;Decreased step length - left;Decreased stance time - right;Decreased stride length;Decreased hip/knee flexion - left;Trunk flexed;Abducted- right   Ambulation Surface Indoor;Level   Stairs Yes   Stairs Assistance 5: Supervision   Stair Management Technique Two rails;Forwards;One rail Right;Sideways   Number of Stairs 4   Ramp 5: Supervision  with walker and prosthesis   Ramp Details (indicate cue type and reason) cues on posture & wt shift   Curb 5: Supervision  prosthesis & RW   Curb Details (indicate cue type and reason) cues on foot position, and technique   Pre-Gait Activities Gait near counter with support cues for lightening up weight bearing and single point cane with cues on step length & wt shift. 10' counter top 10 reps without rest. PT  instructed with demo in foot and pelvis movement with turning 90* direction changes.      Knee/Hip Exercises: Aerobic   Nustep Sci Fit recumbent stepper Level 2.5  with BUE & BLE with focus on full left knee ROM x 5 minutes     Prosthetics   Current prosthetic wear tolerance (days/week)  daily   Current prosthetic wear tolerance (#hours/day)  most of the day with frequent breaks due to tightness/pressure in socket   Residual limb condition  limb is intact. Pt reports the boil and sacral wounds are healing with no drainage at this time   Education Provided Residual limb care;Proper wear schedule/adjustment;Proper weight-bearing schedule/adjustment                  PT Short Term Goals - 06/28/16 8315      PT SHORT TERM GOAL #1   Title Patient tolerates wear of prosthesis daily >5 hrs without limb pain. (Target Date: 06/30/2016)   Baseline MET 06/28/2016 he reports some "pressure" but releases part of suction to alleviate issue   Time 4   Period Weeks   Status Achieved     PT SHORT TERM GOAL #2   Title Patient ambulates 200' with RW & prosthesis with supervision. (Target Date: 06/30/2016)   Baseline MET 06/28/2016   Time 4   Period Weeks   Status Achieved     PT SHORT TERM GOAL #3   Title Patient negotiates ramps, curbs with RW and stairs with 1 rail with prosthesis with supervision. (Target Date: 06/30/2016)   Baseline MET 06/28/2016    Time 4   Period Weeks   Status Achieved     PT SHORT TERM GOAL #4   Title Patient reaches 10" and to floor with UE support modified independent. (Target Date: 06/30/2016)   Time 4   Period Weeks   Status On-going     PT SHORT TERM GOAL #5   Title Patient reports left knee pain <6/10 with standing & gait activities. (Target Date: 06/30/2016)   Baseline MET 06/28/2016 left knee pain "not really" hurting with standing & gait activities.    Time 4   Period Weeks   Status Achieved           PT Long Term Goals - 06/21/16 1558      PT LONG  TERM GOAL #1   Title Patient tolerates prosthesis wear >80% of awake hours without limb pain or skin issues. (Target Date: 07/28/2016)   Time 8   Period Weeks   Status On-going     PT LONG TERM GOAL #2   Title Patient ambulates 400' with LRAD & prosthesis modified independent for community mobility. (Target Date: 07/28/2016)   Time 8  Period Weeks   Status On-going     PT LONG TERM GOAL #3   Title Patient negotiates ramps, curbs & stairs with LRAD & prosthesis modified independent for community access. (Target Date: 07/28/2016)   Time 8   Period Weeks   Status On-going     PT LONG TERM GOAL #4   Title Patient ambulates 8' with single crutch or less and prosthesis modified independent for household mobility. (Target Date: 07/28/2016)   Time 8   Period Weeks   Status On-going     PT LONG TERM GOAL #5   Title Berg Balance >45/56 to indicate lower fall risk. (Target Date: 07/28/2016)   Time 8   Period Weeks   Status On-going               Plan - 06/28/16 1256    Clinical Impression Statement Patient met 4 of 5 STGs and anticipate meet remaining STG at next visit. Patient's activity level is improving and increasing wear of prosthesis to all awake hours should facilitate further improvement. Patient was able to tolerate cane activities near a counter with light support on contralateral to cane UE.    Rehab Potential Good   PT Frequency 2x / week   PT Duration 8 weeks   PT Treatment/Interventions ADLs/Self Care Home Management;Moist Heat;Ultrasound;DME Instruction;Gait training;Stair training;Functional mobility training;Therapeutic activities;Therapeutic exercise;Balance training;Neuromuscular re-education;Patient/family education;Prosthetic Training   PT Next Visit Plan review prosthetic care including wear, gait/barriers with RW/prosthesis for longer distances & cane near counter or wall for light support on 2nd UE.    Consulted and Agree with Plan of Care Patient       Patient will benefit from skilled therapeutic intervention in order to improve the following deficits and impairments:  Abnormal gait, Decreased activity tolerance, Decreased balance, Decreased coordination, Decreased mobility, Decreased strength, Difficulty walking, Decreased knowledge of use of DME, Impaired flexibility, Postural dysfunction, Prosthetic Dependency, Pain  Visit Diagnosis: Muscle weakness (generalized)  Other abnormalities of gait and mobility  Unsteadiness on feet  Pain in left knee  Difficulty in walking, not elsewhere classified     Problem List Patient Active Problem List   Diagnosis Date Noted  . Long term current use of opiate analgesic 12/17/2015  . Debilitated 09/22/2015  . Renal tubular acidosis   . AIDS ([place])   . History of above knee amputation ([place])   . SBO (small bowel obstruction) ([place])   . Pressure ulcer 09/13/2015  . Protein-calorie malnutrition, severe 09/13/2015  . Polyuria 08/06/2015  . Prediabetes 08/06/2015  . Hematuria 08/06/2015  . UTI (urinary tract infection) 05/14/2015  . Avascular necrosis of femoral head ([place]) 05/14/2015  . Left adrenal mass ([place]) 05/14/2015  . CKD (chronic kidney disease) 02/10/2014  . Hyperglycemia 12/29/2013  . Unintentional weight loss 11/04/2013  . Left hip pain 03/09/2013  . Preventative health care 03/09/2013  . Phantom limb syndrome with pain ([place]) 03/21/2012  . Long term current use of anticoagulant 11/20/2010  . ERECTILE DYSFUNCTION, ORGANIC 04/12/2010  . GERD 08/23/2009  . METHICILLIN RESISTANT STAPHYLOCOCCUS AUREUS INFECTION 08/05/2009  . ENLARGEMENT OF LYMPH NODES 07/20/2009  . Embolism and thrombosis of artery ([place]) 07/19/2009  . HYPOGONADISM 07/15/2009  . DEFICIENCY OF OTHER VITAMINS 07/15/2009  . PULMONARY NODULE 07/15/2009  . Status post above knee amputation ([place]) 07/15/2009  . Human immunodeficiency virus (HIV) disease ([place]) 11/10/2006  . SHINGLES, RECURRENT 11/10/2006  .  HEPATITIS B 11/10/2006  . DYSLIPIDEMIA 11/10/2006  . PSYCHOSIS 11/10/2006  . Alcohol abuse 11/10/2006  .  CIGARETTE SMOKER 11/10/2006  . PERIPHERAL NEUROPATHY 11/10/2006  . ALLERGIC RHINITIS 11/10/2006  . DENTAL CARIES 11/10/2006    Jamey Reas PT, DPT 06/28/2016, 1:06 PM  [facility] [address] [place], [place], [address] Phone: [phone]   Fax:  [phone]  Name: [name] MRN: [id number] Date of Birth: 1960/12/11

## 2016-06-29 ENCOUNTER — Ambulatory Visit: Payer: Medicare Other | Admitting: Physical Therapy

## 2016-06-29 ENCOUNTER — Encounter: Payer: Self-pay | Admitting: Physical Therapy

## 2016-06-29 DIAGNOSIS — R2681 Unsteadiness on feet: Secondary | ICD-10-CM

## 2016-06-29 DIAGNOSIS — R2689 Other abnormalities of gait and mobility: Secondary | ICD-10-CM

## 2016-06-29 DIAGNOSIS — M25562 Pain in left knee: Secondary | ICD-10-CM

## 2016-06-29 DIAGNOSIS — M6281 Muscle weakness (generalized): Secondary | ICD-10-CM | POA: Diagnosis not present

## 2016-06-29 DIAGNOSIS — R262 Difficulty in walking, not elsewhere classified: Secondary | ICD-10-CM | POA: Diagnosis not present

## 2016-06-30 NOTE — Therapy (Signed)
[facility] [address] [place], [place], [address] Phone: [phone]   Fax:  [phone]  Physical Therapy Treatment  Patient Details  Name: [name] MRN: [id number] Date of Birth: 1960-11-05 Referring Provider: Burgess Estelle, MD resident Joni Reining, DO)  Encounter Date: 06/29/2016      PT End of Session - 06/29/16 0930    Visit Number 9   Number of Visits 17   Date for PT Re-Evaluation 07/28/16   Authorization Type Medicare G-code & progress note   PT Start Time 0845   PT Stop Time 0930   PT Time Calculation (min) 45 min   Equipment Utilized During Treatment Gait belt   Activity Tolerance Patient limited by pain   Behavior During Therapy [facility] for tasks assessed/performed      Past Medical History:  Diagnosis Date  . Chronic kidney disease   . History of chicken pox   . History of DVT of lower extremity    right  . HIV (human immunodeficiency virus infection) ([place])   . Hyperlipidemia   . Left hip pain 03/09/2013  . Pneumonia   . Protein malnutrition ([place]) 08/2015    Past Surgical History:  Procedure Laterality Date  . APPENDECTOMY  1962  . LEG AMPUTATION ABOVE KNEE  2010   right leg    There were no vitals filed for this visit.      Subjective Assessment - 06/29/16 0846    Subjective No changes in pain with activities in PT yesterday.    Pertinent History Right AKA, HIV, shingles, DVT, Lt hip pain, CKD, Hep B,    Limitations Lifting;Standing;Walking   Patient Stated Goals He wants to work on walking & "get muscles toned up"   Currently in Pain? Yes   Pain Score 3    Pain Location Other (Comment)  generalized shoulders & back   Pain Orientation Anterior   Pain Descriptors / Indicators Sharp;Sore   Pain Type Chronic pain   Pain Onset More than a month ago   Pain Frequency Intermittent   Aggravating Factors  mvoements   Pain Relieving Factors pain meds   Effect of Pain on Daily  Activities standing and gait limited                         [facility] Adult PT Treatment/Exercise - 06/29/16 0845      Transfers   Transfers Sit to Stand;Stand to Sit   Sit to Stand 5: Supervision;With upper extremity assist;With armrests;From chair/3-in-1   Stand to Sit 5: Supervision;With upper extremity assist;With armrests;To chair/3-in-1     Ambulation/Gait   Ambulation/Gait Yes   Ambulation/Gait Assistance 5: Supervision;4: Min assist  SBA w/ RW, MinA with cane   Ambulation/Gait Assistance Details upright posture using vision to facilitate, cane near counter for support and progressed to wall support: verbal and tactile cues on sequence, wt shift and step length.    Ambulation Distance (Feet) 200 Feet  200' with RW, 55' X3 with cane & counter/wall   Assistive device Rolling walker;Prosthesis;Straight cane  straight cane with RUE support on counter or wall   Gait Pattern Step-through pattern;Decreased step length - left;Decreased stance time - right;Decreased stride length;Decreased hip/knee flexion - left;Trunk flexed;Abducted- right   Ambulation Surface Indoor;Level   Stairs --   Stairs Assistance --   Stair Management Technique --   Number of Stairs --   Ramp 5: Supervision  with walker and prosthesis  Curb 5: Supervision  prosthesis & RW     Neuro Re-ed    Neuro Re-ed Details  Near counter with "light" UE support: side step with overhead reach, turning clockwise & counterclockwise with weighting/unweighting with rotation, upper trunk rotation with wt distribution over feet and forward flexion with back extension with UE extension / scapular retraction.      Knee/Hip Exercises: Aerobic   Nustep Sci Fit recumbent stepper Level 2.5  with BUE & BLE with focus on full left knee ROM x 8 minutes     Prosthetics   Current prosthetic wear tolerance (days/week)  daily   Current prosthetic wear tolerance (#hours/day)  most of the day with frequent breaks due to  tightness/pressure in socket   Residual limb condition  limb is intact. Pt reports the boil and sacral wounds are healing with no drainage at this time   Education Provided Residual limb care;Proper wear schedule/adjustment;Proper weight-bearing schedule/adjustment                  PT Short Term Goals - 06/29/16 0930      PT SHORT TERM GOAL #1   Title Patient tolerates wear of prosthesis daily >5 hrs without limb pain. (Target Date: 06/30/2016)   Baseline MET 06/28/2016 he reports some "pressure" but releases part of suction to alleviate issue   Time 4   Period Weeks   Status Achieved     PT SHORT TERM GOAL #2   Title Patient ambulates 200' with RW & prosthesis with supervision. (Target Date: 06/30/2016)   Baseline MET 06/28/2016   Time 4   Period Weeks   Status Achieved     PT SHORT TERM GOAL #3   Title Patient negotiates ramps, curbs with RW and stairs with 1 rail with prosthesis with supervision. (Target Date: 06/30/2016)   Baseline MET 06/28/2016    Time 4   Period Weeks   Status Achieved     PT SHORT TERM GOAL #4   Title Patient reaches 10" and to floor with UE support modified independent. (Target Date: 06/30/2016)   Baseline MET 06/29/2016   Time 4   Period Weeks   Status Achieved     PT SHORT TERM GOAL #5   Title Patient reports left knee pain <6/10 with standing & gait activities. (Target Date: 06/30/2016)   Baseline MET 06/28/2016 left knee pain "not really" hurting with standing & gait activities.    Time 4   Period Weeks   Status Achieved           PT Long Term Goals - 06/21/16 1558      PT LONG TERM GOAL #1   Title Patient tolerates prosthesis wear >80% of awake hours without limb pain or skin issues. (Target Date: 07/28/2016)   Time 8   Period Weeks   Status On-going     PT LONG TERM GOAL #2   Title Patient ambulates 400' with LRAD & prosthesis modified independent for community mobility. (Target Date: 07/28/2016)   Time 8   Period Weeks   Status  On-going     PT LONG TERM GOAL #3   Title Patient negotiates ramps, curbs & stairs with LRAD & prosthesis modified independent for community access. (Target Date: 07/28/2016)   Time 8   Period Weeks   Status On-going     PT LONG TERM GOAL #4   Title Patient ambulates 80' with single crutch or less and prosthesis modified independent for household mobility. (Target Date: 07/28/2016)  Time 8   Period Weeks   Status On-going     PT LONG TERM GOAL #5   Title Berg Balance >45/56 to indicate lower fall risk. (Target Date: 07/28/2016)   Time 8   Period Weeks   Status On-going               Plan - 06/29/16 0930    Clinical Impression Statement Patient met 5th STG for all 5 STGs met. He tolerated gait with less UE support on cane with touch on wall with contralateral UE with pain increasing significantly.    Rehab Potential Good   PT Frequency 2x / week   PT Duration 8 weeks   PT Treatment/Interventions ADLs/Self Care Home Management;Moist Heat;Ultrasound;DME Instruction;Gait training;Stair training;Functional mobility training;Therapeutic activities;Therapeutic exercise;Balance training;Neuromuscular re-education;Patient/family education;Prosthetic Training   PT Next Visit Plan Do G-code Berg Balance, review prosthetic care including wear, gait/barriers with froearm crutches/prosthesis for longer distances & cane near counter or wall for light support on 2nd UE.    Consulted and Agree with Plan of Care Patient      Patient will benefit from skilled therapeutic intervention in order to improve the following deficits and impairments:  Abnormal gait, Decreased activity tolerance, Decreased balance, Decreased coordination, Decreased mobility, Decreased strength, Difficulty walking, Decreased knowledge of use of DME, Impaired flexibility, Postural dysfunction, Prosthetic Dependency, Pain  Visit Diagnosis: Muscle weakness (generalized)  Other abnormalities of gait and  mobility  Unsteadiness on feet  Pain in left knee     Problem List Patient Active Problem List   Diagnosis Date Noted  . Long term current use of opiate analgesic 12/17/2015  . Debilitated 09/22/2015  . Renal tubular acidosis   . AIDS ([place])   . History of above knee amputation ([place])   . SBO (small bowel obstruction) ([place])   . Pressure ulcer 09/13/2015  . Protein-calorie malnutrition, severe 09/13/2015  . Polyuria 08/06/2015  . Prediabetes 08/06/2015  . Hematuria 08/06/2015  . UTI (urinary tract infection) 05/14/2015  . Avascular necrosis of femoral head ([place]) 05/14/2015  . Left adrenal mass ([place]) 05/14/2015  . CKD (chronic kidney disease) 02/10/2014  . Hyperglycemia 12/29/2013  . Unintentional weight loss 11/04/2013  . Left hip pain 03/09/2013  . Preventative health care 03/09/2013  . Phantom limb syndrome with pain ([place]) 03/21/2012  . Long term current use of anticoagulant 11/20/2010  . ERECTILE DYSFUNCTION, ORGANIC 04/12/2010  . GERD 08/23/2009  . METHICILLIN RESISTANT STAPHYLOCOCCUS AUREUS INFECTION 08/05/2009  . ENLARGEMENT OF LYMPH NODES 07/20/2009  . Embolism and thrombosis of artery ([place]) 07/19/2009  . HYPOGONADISM 07/15/2009  . DEFICIENCY OF OTHER VITAMINS 07/15/2009  . PULMONARY NODULE 07/15/2009  . Status post above knee amputation ([place]) 07/15/2009  . Human immunodeficiency virus (HIV) disease ([place]) 11/10/2006  . SHINGLES, RECURRENT 11/10/2006  . HEPATITIS B 11/10/2006  . DYSLIPIDEMIA 11/10/2006  . PSYCHOSIS 11/10/2006  . Alcohol abuse 11/10/2006  . CIGARETTE SMOKER 11/10/2006  . PERIPHERAL NEUROPATHY 11/10/2006  . ALLERGIC RHINITIS 11/10/2006  . DENTAL CARIES 11/10/2006    Jamey Reas PT, DPT 06/30/2016, 6:57 AM  [facility] [address] [place], [place], [address] Phone: [phone]   Fax:  [phone]  Name: Avid Guillette MRN: [id number] Date of Birth: 03-08-1961

## 2016-07-05 ENCOUNTER — Ambulatory Visit: Payer: Medicare Other | Attending: Internal Medicine | Admitting: Physical Therapy

## 2016-07-05 ENCOUNTER — Encounter: Payer: Self-pay | Admitting: Physical Therapy

## 2016-07-05 DIAGNOSIS — R2681 Unsteadiness on feet: Secondary | ICD-10-CM | POA: Insufficient documentation

## 2016-07-05 DIAGNOSIS — M6281 Muscle weakness (generalized): Secondary | ICD-10-CM | POA: Diagnosis not present

## 2016-07-05 DIAGNOSIS — R262 Difficulty in walking, not elsewhere classified: Secondary | ICD-10-CM | POA: Diagnosis not present

## 2016-07-05 DIAGNOSIS — M25562 Pain in left knee: Secondary | ICD-10-CM | POA: Diagnosis not present

## 2016-07-05 DIAGNOSIS — R2689 Other abnormalities of gait and mobility: Secondary | ICD-10-CM | POA: Diagnosis not present

## 2016-07-05 NOTE — Therapy (Signed)
[facility] [address] [place], [place], [address] Phone: [phone]   Fax:  [phone]  Physical Therapy Treatment  Patient Details  Name: [name] MRN: [id number] Date of Birth: December 19, 1960 Referring Provider: Deneise Lever, MD resident Carlynn Purl, DO)  Encounter Date: 07/05/2016      PT End of Session - 07/05/16 2204    Visit Number 10   Number of Visits 17   Date for PT Re-Evaluation 07/28/16   Authorization Type Medicare G-code & progress note   PT Start Time 0846   PT Stop Time 0929   PT Time Calculation (min) 43 min   Equipment Utilized During Treatment Gait belt   Activity Tolerance Patient limited by pain   Behavior During Therapy [facility] for tasks assessed/performed      Past Medical History:  Diagnosis Date  . Chronic kidney disease   . History of chicken pox   . History of DVT of lower extremity    right  . HIV (human immunodeficiency virus infection) ([facility])   . Hyperlipidemia   . Left hip pain 03/09/2013  . Pneumonia   . Protein malnutrition ([facility]) 08/2015    Past Surgical History:  Procedure Laterality Date  . APPENDECTOMY  1962  . LEG AMPUTATION ABOVE KNEE  2010   right leg    There were no vitals filed for this visit.      Subjective Assessment - 07/05/16 0851    Subjective He wore prosthesis 5hrs daily and second wear only once since last appt.    Pertinent History Right AKA, HIV, shingles, DVT, Lt hip pain, CKD, Hep B,    Limitations Lifting;Standing;Walking   Patient Stated Goals He wants to work on walking & "get muscles toned up"   Currently in Pain? Yes   Pain Score 3   when moving 6-7/10   Pain Location Other (Comment)  shoulders & back   Pain Orientation Anterior   Pain Descriptors / Indicators Sore;Sharp   Pain Type Chronic pain   Pain Onset More than a month ago   Pain Frequency Intermittent   Aggravating Factors  movements   Pain Relieving Factors pain meds    Effect of Pain on Daily Activities standing & gait limited   Pain Score 0  7-8/10 when limb seats to bottom of socket   Pain Location Leg   Pain Orientation Right   Pain Descriptors / Indicators Pressure   Pain Onset More than a month ago   Pain Frequency Intermittent   Aggravating Factors  limb seating in bottom of pressure    Pain Relieving Factors pushes prosthesis off             [facility] PT Assessment - 07/05/16 0845      Berg Balance Test   Sit to Stand Able to stand  independently using hands   Standing Unsupported Able to stand 2 minutes with supervision   Sitting with Back Unsupported but Feet Supported on Floor or Stool Able to sit safely and securely 2 minutes   Stand to Sit Controls descent by using hands   Transfers Able to transfer safely, definite need of hands   Standing Unsupported with Eyes Closed Able to stand 10 seconds with supervision   Standing Ubsupported with Feet Together Needs help to attain position but able to stand for 30 seconds with feet together   From Standing, Reach Forward with Outstretched Arm Can reach forward >5 cm safely (2")   From Standing Position,  Pick up Object from Floor Unable to pick up shoe, but reaches 2-5 cm (1-2") from shoe and balances independently   From Standing Position, Turn to Look Behind Over each Shoulder Turn sideways only but maintains balance   Turn 360 Degrees Needs close supervision or verbal cueing   Standing Unsupported, Alternately Place Feet on Step/Stool Able to complete >2 steps/needs minimal assist   Standing Unsupported, One Foot in Front Needs help to step but can hold 15 seconds   Standing on One Leg Tries to lift leg/unable to hold 3 seconds but remains standing independently   Total Score 30                     [facility] Adult PT Treatment/Exercise - 07/05/16 0845      Transfers   Transfers Sit to Stand;Stand to Sit   Sit to Stand 5: Supervision;With upper extremity assist;With armrests;From  chair/3-in-1   Stand to Sit 5: Supervision;With upper extremity assist;With armrests;To chair/3-in-1     Ambulation/Gait   Ambulation/Gait Yes   Ambulation/Gait Assistance 4: Min guard;5: Supervision   Ambulation/Gait Assistance Details Verbal cues on posture & step length.    Ambulation Distance (Feet) 70 Feet  70' X 2   Assistive device Prosthesis;Lofstrands;R Forearm Crutch;L Forearm Crutch  single crutch along counter with RUE support   Gait Pattern Step-through pattern;Decreased step length - left;Decreased stance time - right;Decreased stride length;Decreased hip/knee flexion - left;Trunk flexed;Abducted- right   Ambulation Surface Indoor;Level   Ramp 5: Supervision  with forearm crutches and prosthesis   Ramp Details (indicate cue type and reason) cues on technique including posture & wt shift.    Curb 5: Supervision  prosthesis & forearm crutches   Curb Details (indicate cue type and reason) cues on sequencing and wt shift     Neuro Re-ed    Neuro Re-ed Details  Near counter with "light" UE support: side step with overhead reach, turning clockwise & counterclockwise with weighting/unweighting with rotation, upper trunk rotation with wt distribution over feet and forward flexion with back extension with UE extension / scapular retraction.      Prosthetics   Prosthetic Care Comments  PT instructed in need to increase wear at home & community to decrease large variations in activity level especially with chronic pain issues.    Current prosthetic wear tolerance (days/week)  daily   Current prosthetic wear tolerance (#hours/day)  PT instructed 2 wears per day with goal 5-6 hrs each wear but at least to tolerance.    Residual limb condition  reports intact   Education Provided Proper wear schedule/adjustment;Proper weight-bearing schedule/adjustment;Proper Donning   Person(s) Educated Patient   Education Method Explanation;Verbal cues   Education Method Verbalized  understanding;Needs further instruction                PT Education - 07/05/16 0845    Education provided Yes   Education Details increasing activity level with high frequency short walks (b/w rooms in home), 3-4 medium distance walks, 1 walk/day to his max tolerance. Use of shopping cart power & push alternating use with family.    Person(s) Educated Patient   Methods Explanation;Verbal cues   Comprehension Verbalized understanding;Verbal cues required          PT Short Term Goals - 06/29/16 0930      PT SHORT TERM GOAL #1   Title Patient tolerates wear of prosthesis daily >5 hrs without limb pain. (Target Date: 06/30/2016)   Baseline MET 06/28/2016  he reports some "pressure" but releases part of suction to alleviate issue   Time 4   Period Weeks   Status Achieved     PT SHORT TERM GOAL #2   Title Patient ambulates 200' with RW & prosthesis with supervision. (Target Date: 06/30/2016)   Baseline MET 06/28/2016   Time 4   Period Weeks   Status Achieved     PT SHORT TERM GOAL #3   Title Patient negotiates ramps, curbs with RW and stairs with 1 rail with prosthesis with supervision. (Target Date: 06/30/2016)   Baseline MET 06/28/2016    Time 4   Period Weeks   Status Achieved     PT SHORT TERM GOAL #4   Title Patient reaches 10" and to floor with UE support modified independent. (Target Date: 06/30/2016)   Baseline MET 06/29/2016   Time 4   Period Weeks   Status Achieved     PT SHORT TERM GOAL #5   Title Patient reports left knee pain <6/10 with standing & gait activities. (Target Date: 06/30/2016)   Baseline MET 06/28/2016 left knee pain "not really" hurting with standing & gait activities.    Time 4   Period Weeks   Status Achieved           PT Long Term Goals - 06/21/16 1558      PT LONG TERM GOAL #1   Title Patient tolerates prosthesis wear >80% of awake hours without limb pain or skin issues. (Target Date: 07/28/2016)   Time 8   Period Weeks   Status On-going      PT LONG TERM GOAL #2   Title Patient ambulates 400' with LRAD & prosthesis modified independent for community mobility. (Target Date: 07/28/2016)   Time 8   Period Weeks   Status On-going     PT LONG TERM GOAL #3   Title Patient negotiates ramps, curbs & stairs with LRAD & prosthesis modified independent for community access. (Target Date: 07/28/2016)   Time 8   Period Weeks   Status On-going     PT LONG TERM GOAL #4   Title Patient ambulates 38' with single crutch or less and prosthesis modified independent for household mobility. (Target Date: 07/28/2016)   Time 8   Period Weeks   Status On-going     PT LONG TERM GOAL #5   Title Berg Balance >45/56 to indicate lower fall risk. (Target Date: 07/28/2016)   Time 8   Period Weeks   Status On-going               Plan - 07/05/16 2207    Clinical Impression Statement Patient verbalizes understanding of instruction to increase wear of prosthesis & activity level to improve mobility but is not compliant with recommendations. Pt improved balance with skilled instruction and intermittent UE support.    Rehab Potential Good   PT Frequency 2x / week   PT Duration 8 weeks   PT Treatment/Interventions ADLs/Self Care Home Management;Moist Heat;Ultrasound;DME Instruction;Gait training;Stair training;Functional mobility training;Therapeutic activities;Therapeutic exercise;Balance training;Neuromuscular re-education;Patient/family education;Prosthetic Training   PT Next Visit Plan review prosthetic care including wear, gait/barriers with froearm crutches/prosthesis for longer distances & cane near counter or wall for light support on 2nd UE.    Consulted and Agree with Plan of Care Patient      Patient will benefit from skilled therapeutic intervention in order to improve the following deficits and impairments:  Abnormal gait, Decreased activity tolerance, Decreased balance, Decreased coordination, Decreased mobility, Decreased strength,  Difficulty walking, Decreased knowledge of use of DME, Impaired flexibility, Postural dysfunction, Prosthetic Dependency, Pain  Visit Diagnosis: Muscle weakness (generalized)  Other abnormalities of gait and mobility  Unsteadiness on feet  Pain in left knee       G-Codes - 07-21-2016 Dec 22, 2201    Functional Assessment Tool Used Patient reports daily wear at least 5 hrs for one wear and occasional 2nd wear for 2-4 hrs. Berg Balance 30/56   Functional Limitation Mobility: Walking and moving around   Mobility: Walking and Moving Around Current Status [phone]) At least 40 percent but less than 60 percent impaired, limited or restricted   Mobility: Walking and Moving Around Goal Status [phone]) At least 20 percent but less than 40 percent impaired, limited or restricted      Problem List Patient Active Problem List   Diagnosis Date Noted  . Long term current use of opiate analgesic 12/17/2015  . Debilitated 09/22/2015  . Renal tubular acidosis   . AIDS ([place])   . History of above knee amputation ([place])   . SBO (small bowel obstruction) ([place])   . Pressure ulcer 09/13/2015  . Protein-calorie malnutrition, severe 09/13/2015  . Polyuria 08/06/2015  . Prediabetes 08/06/2015  . Hematuria 08/06/2015  . UTI (urinary tract infection) 05/14/2015  . Avascular necrosis of femoral head ([place]) 05/14/2015  . Left adrenal mass ([place]) 05/14/2015  . CKD (chronic kidney disease) 02/10/2014  . Hyperglycemia 12/29/2013  . Unintentional weight loss 11/04/2013  . Left hip pain 03/09/2013  . Preventative health care 03/09/2013  . Phantom limb syndrome with pain ([place]) 03/21/2012  . Long term current use of anticoagulant 11/20/2010  . ERECTILE DYSFUNCTION, ORGANIC 04/12/2010  . GERD 08/23/2009  . METHICILLIN RESISTANT STAPHYLOCOCCUS AUREUS INFECTION 08/05/2009  . ENLARGEMENT OF LYMPH NODES 07/20/2009  . Embolism and thrombosis of artery ([place]) 07/19/2009  . HYPOGONADISM 07/15/2009  . DEFICIENCY OF OTHER  VITAMINS 07/15/2009  . PULMONARY NODULE 07/15/2009  . Status post above knee amputation ([place]) 07/15/2009  . Human immunodeficiency virus (HIV) disease ([place]) 11/10/2006  . SHINGLES, RECURRENT 11/10/2006  . HEPATITIS B 11/10/2006  . DYSLIPIDEMIA 11/10/2006  . PSYCHOSIS 11/10/2006  . Alcohol abuse 11/10/2006  . CIGARETTE SMOKER 11/10/2006  . PERIPHERAL NEUROPATHY 11/10/2006  . ALLERGIC RHINITIS 11/10/2006  . DENTAL CARIES 11/10/2006    Jamey Reas PT, DPT 07-21-16, 10:10 PM  [facility] [address] [place], [place], [address] Phone: [phone]   Fax:  [phone]  Name: [name] MRN: [id number] Date of Birth: 08/07/1961

## 2016-07-07 ENCOUNTER — Ambulatory Visit: Payer: Medicare Other | Admitting: Rehabilitation

## 2016-07-07 ENCOUNTER — Encounter: Payer: Self-pay | Admitting: Rehabilitation

## 2016-07-07 DIAGNOSIS — R2681 Unsteadiness on feet: Secondary | ICD-10-CM

## 2016-07-07 DIAGNOSIS — M6281 Muscle weakness (generalized): Secondary | ICD-10-CM

## 2016-07-07 DIAGNOSIS — R262 Difficulty in walking, not elsewhere classified: Secondary | ICD-10-CM | POA: Diagnosis not present

## 2016-07-07 DIAGNOSIS — R2689 Other abnormalities of gait and mobility: Secondary | ICD-10-CM

## 2016-07-07 DIAGNOSIS — M25562 Pain in left knee: Secondary | ICD-10-CM | POA: Diagnosis not present

## 2016-07-07 NOTE — Therapy (Signed)
[facility] [address] [place], [place], [address] Phone: [phone]   Fax:  [phone]  Physical Therapy Treatment  Patient Details  Name: [name] MRN: [id number] Date of Birth: 12-18-1960 Referring Provider: Burgess Estelle, MD resident Joni Reining, DO)  Encounter Date: 07/07/2016      PT End of Session - 07/07/16 0850    Visit Number 11   Number of Visits 17   Date for PT Re-Evaluation 07/28/16   Authorization Type Medicare G-code & progress note   PT Start Time 0847   PT Stop Time 0930   PT Time Calculation (min) 43 min   Equipment Utilized During Treatment Gait belt   Activity Tolerance Patient limited by pain   Behavior During Therapy [facility] for tasks assessed/performed      Past Medical History:  Diagnosis Date  . Chronic kidney disease   . History of chicken pox   . History of DVT of lower extremity    right  . HIV (human immunodeficiency virus infection) ([place])   . Hyperlipidemia   . Left hip pain 03/09/2013  . Pneumonia   . Protein malnutrition ([place]) 08/2015    Past Surgical History:  Procedure Laterality Date  . APPENDECTOMY  1962  . LEG AMPUTATION ABOVE KNEE  2010   right leg    There were no vitals filed for this visit.      Subjective Assessment - 07/07/16 0848    Subjective States he is wearing prosthesis most of awake hours, could not give actual time.    Pertinent History Right AKA, HIV, shingles, DVT, Lt hip pain, CKD, Hep B,    Limitations Lifting;Standing;Walking   Patient Stated Goals He wants to work on walking & "get muscles toned up"   Currently in Pain? Yes   Pain Score 3    Pain Location Generalized  more so on R low back   Pain Descriptors / Indicators Aching   Pain Type Chronic pain   Pain Onset More than a month ago   Pain Frequency Intermittent   Aggravating Factors  movements   Pain Relieving Factors pain meds   Pain Onset More than a month ago              TE:  Performed supine and standing therex during session for BLE strengthening; supine BLE bridging x 10 reps with cues to perform this at home.  Pt verbalized understanding.  Sit<>stand x 5 reps with cues for posture and increased hip extension when standing, standing hip abduction x 5 reps on each side progressing to side stepping x 2 reps down and back at S level with cues for decreasing UE support as able for increased balance challenge.  Performed supine hip flexor stretch with LE off edge of mat due to noted hip flexor tightness during all mobility causing forward flexed posture.  Pt with increased pain, therefore provided light assist into adequate ROM with very light overpressure as tolerated.  Performed x 1 rep x 2 mins on each side.    Self Care:  Pt voicing having sacral wound that is being cared for by RN.  Note that he is overly sedentary at home, therefore educated on pressure relief options when in w/c with tilt option vs standing vs lateral or forward weight shifts.  Educated to perform every 30 mins when up in chair for adequate relief, however pt states that MD told him every 1.5 to 2 hours, therefore he would only follow  those recommendations.  Education on importance and physiology (in plain terms) as to why pressure relief important, however continued to state that he would only do what MD states.    Gait:  Performed gait x 115' with B loftstrand crutches at S level with cues for upright posture, increased hip protraction/extension in stance, however he does well with sequencing.  Near end of session pt requesting to use RW due to increased pain and unwilling to trial gait with single loftstrand crutch, therefore did not continue with gait.                      PT Education - 07/07/16 0850    Education provided Yes   Education Details Education on pressure relief   Person(s) Educated Patient   Methods Explanation   Comprehension Verbalized  understanding          PT Short Term Goals - 06/29/16 0930      PT SHORT TERM GOAL #1   Title Patient tolerates wear of prosthesis daily >5 hrs without limb pain. (Target Date: 06/30/2016)   Baseline MET 06/28/2016 he reports some "pressure" but releases part of suction to alleviate issue   Time 4   Period Weeks   Status Achieved     PT SHORT TERM GOAL #2   Title Patient ambulates 200' with RW & prosthesis with supervision. (Target Date: 06/30/2016)   Baseline MET 06/28/2016   Time 4   Period Weeks   Status Achieved     PT SHORT TERM GOAL #3   Title Patient negotiates ramps, curbs with RW and stairs with 1 rail with prosthesis with supervision. (Target Date: 06/30/2016)   Baseline MET 06/28/2016    Time 4   Period Weeks   Status Achieved     PT SHORT TERM GOAL #4   Title Patient reaches 10" and to floor with UE support modified independent. (Target Date: 06/30/2016)   Baseline MET 06/29/2016   Time 4   Period Weeks   Status Achieved     PT SHORT TERM GOAL #5   Title Patient reports left knee pain <6/10 with standing & gait activities. (Target Date: 06/30/2016)   Baseline MET 06/28/2016 left knee pain "not really" hurting with standing & gait activities.    Time 4   Period Weeks   Status Achieved           PT Long Term Goals - 06/21/16 1558      PT LONG TERM GOAL #1   Title Patient tolerates prosthesis wear >80% of awake hours without limb pain or skin issues. (Target Date: 07/28/2016)   Time 8   Period Weeks   Status On-going     PT LONG TERM GOAL #2   Title Patient ambulates 400' with LRAD & prosthesis modified independent for community mobility. (Target Date: 07/28/2016)   Time 8   Period Weeks   Status On-going     PT LONG TERM GOAL #3   Title Patient negotiates ramps, curbs & stairs with LRAD & prosthesis modified independent for community access. (Target Date: 07/28/2016)   Time 8   Period Weeks   Status On-going     PT LONG TERM GOAL #4   Title Patient  ambulates 71' with single crutch or less and prosthesis modified independent for household mobility. (Target Date: 07/28/2016)   Time 8   Period Weeks   Status On-going     PT LONG TERM GOAL #5   Title Merrilee Jansky  Balance >45/56 to indicate lower fall risk. (Target Date: 07/28/2016)   Time 8   Period Weeks   Status On-going               Plan - 07/07/16 0850    Clinical Impression Statement Skilled session focused on BLE strengthening, balance and gait with use of forearm crutches.  Continues to have increased pain with all activity and continue to educate on movement through pain in order to improve strength and decrease pain.  Also educated on pressure relief due to noted sacral wound.     Rehab Potential Good   PT Frequency 2x / week   PT Duration 8 weeks   PT Treatment/Interventions ADLs/Self Care Home Management;Moist Heat;Ultrasound;DME Instruction;Gait training;Stair training;Functional mobility training;Therapeutic activities;Therapeutic exercise;Balance training;Neuromuscular re-education;Patient/family education;Prosthetic Training   PT Next Visit Plan review prosthetic care including wear, gait/barriers with froearm crutches/prosthesis for longer distances & cane near counter or wall for light support on 2nd UE.    Consulted and Agree with Plan of Care Patient      Patient will benefit from skilled therapeutic intervention in order to improve the following deficits and impairments:  Abnormal gait, Decreased activity tolerance, Decreased balance, Decreased coordination, Decreased mobility, Decreased strength, Difficulty walking, Decreased knowledge of use of DME, Impaired flexibility, Postural dysfunction, Prosthetic Dependency, Pain  Visit Diagnosis: Muscle weakness (generalized)  Other abnormalities of gait and mobility  Unsteadiness on feet     Problem List Patient Active Problem List   Diagnosis Date Noted  . Long term current use of opiate analgesic 12/17/2015  .  Debilitated 09/22/2015  . Renal tubular acidosis   . AIDS ([place])   . History of above knee amputation ([place])   . SBO (small bowel obstruction) ([place])   . Pressure ulcer 09/13/2015  . Protein-calorie malnutrition, severe 09/13/2015  . Polyuria 08/06/2015  . Prediabetes 08/06/2015  . Hematuria 08/06/2015  . UTI (urinary tract infection) 05/14/2015  . Avascular necrosis of femoral head ([place]) 05/14/2015  . Left adrenal mass ([place]) 05/14/2015  . CKD (chronic kidney disease) 02/10/2014  . Hyperglycemia 12/29/2013  . Unintentional weight loss 11/04/2013  . Left hip pain 03/09/2013  . Preventative health care 03/09/2013  . Phantom limb syndrome with pain ([place]) 03/21/2012  . Long term current use of anticoagulant 11/20/2010  . ERECTILE DYSFUNCTION, ORGANIC 04/12/2010  . GERD 08/23/2009  . METHICILLIN RESISTANT STAPHYLOCOCCUS AUREUS INFECTION 08/05/2009  . ENLARGEMENT OF LYMPH NODES 07/20/2009  . Embolism and thrombosis of artery ([place]) 07/19/2009  . HYPOGONADISM 07/15/2009  . DEFICIENCY OF OTHER VITAMINS 07/15/2009  . PULMONARY NODULE 07/15/2009  . Status post above knee amputation ([place]) 07/15/2009  . Human immunodeficiency virus (HIV) disease ([place]) 11/10/2006  . SHINGLES, RECURRENT 11/10/2006  . HEPATITIS B 11/10/2006  . DYSLIPIDEMIA 11/10/2006  . PSYCHOSIS 11/10/2006  . Alcohol abuse 11/10/2006  . CIGARETTE SMOKER 11/10/2006  . PERIPHERAL NEUROPATHY 11/10/2006  . ALLERGIC RHINITIS 11/10/2006  . DENTAL CARIES 11/10/2006    Cameron Sprang, PT, MPT [facility] [address] [place], [place], [address] Phone: [phone]   Fax:  [phone] 07/07/16, 12:45 PM  Name: [name] MRN: [id number] Date of Birth: 1961-08-17

## 2016-07-12 ENCOUNTER — Ambulatory Visit: Payer: Medicare Other | Admitting: Physical Therapy

## 2016-07-14 ENCOUNTER — Ambulatory Visit: Payer: Medicare Other | Admitting: Physical Therapy

## 2016-07-14 ENCOUNTER — Encounter: Payer: Self-pay | Admitting: Physical Therapy

## 2016-07-14 DIAGNOSIS — R2681 Unsteadiness on feet: Secondary | ICD-10-CM | POA: Diagnosis not present

## 2016-07-14 DIAGNOSIS — R262 Difficulty in walking, not elsewhere classified: Secondary | ICD-10-CM

## 2016-07-14 DIAGNOSIS — M6281 Muscle weakness (generalized): Secondary | ICD-10-CM

## 2016-07-14 DIAGNOSIS — M25562 Pain in left knee: Secondary | ICD-10-CM

## 2016-07-14 DIAGNOSIS — R2689 Other abnormalities of gait and mobility: Secondary | ICD-10-CM | POA: Diagnosis not present

## 2016-07-14 NOTE — Therapy (Signed)
[facility] [address] [place], [place], [address] Phone: [phone]   Fax:  [phone]  Physical Therapy Treatment  Patient Details  Name: [name] MRN: [id number] Date of Birth: [birth year] Referring Provider: Burgess Estelle, MD resident Joni Reining, DO)  Encounter Date: 07/14/2016      PT End of Session - 07/14/16 0855    Visit Number 12   Number of Visits 17   Date for PT Re-Evaluation 07/28/16   Authorization Type Medicare G-code & progress note   PT Start Time 0850   PT Stop Time 0932   PT Time Calculation (min) 42 min   Equipment Utilized During Treatment Gait belt   Activity Tolerance Patient limited by pain   Behavior During Therapy [facility] for tasks assessed/performed      Past Medical History:  Diagnosis Date  . Chronic kidney disease   . History of chicken pox   . History of DVT of lower extremity    right  . HIV (human immunodeficiency virus infection) ([place])   . Hyperlipidemia   . Left hip pain 03/09/2013  . Pneumonia   . Protein malnutrition ([place]) 08/2015    Past Surgical History:  Procedure Laterality Date  . APPENDECTOMY  1962  . LEG AMPUTATION ABOVE KNEE  2010   right leg    There were no vitals filed for this visit.      Subjective Assessment - 07/14/16 0853    Subjective No new complaints. No falls to report. Having bad phantom pain today, rest of his pain is "tolerable".   Pertinent History Right AKA, HIV, shingles, DVT, Lt hip pain, CKD, Hep B,    Limitations Lifting;Standing;Walking   Patient Stated Goals He wants to work on walking & "get muscles toned up"   Currently in Pain? Yes   Pain Score 6    Pain Location Generalized   Pain Orientation Right   Pain Descriptors / Indicators Tightness   Pain Type Phantom pain   Pain Onset More than a month ago   Pain Frequency Constant   Aggravating Factors  prosthesis wear and use   Pain Relieving Factors pain meds, removing  prosthesis             [facility] Adult PT Treatment/Exercise - 07/14/16 0855      Transfers   Transfers Sit to Stand;Stand to Sit   Sit to Stand 5: Supervision;With upper extremity assist;With armrests;From chair/3-in-1   Sit to Stand Details Verbal cues for sequencing;Verbal cues for technique;Verbal cues for precautions/safety;Verbal cues for safe use of DME/AE   Sit to Stand Details (indicate cue type and reason) cues for standing with loftstrands for safer technique   Stand to Sit 5: Supervision;With upper extremity assist;With armrests;To chair/3-in-1   Stand to Sit Details (indicate cue type and reason) Verbal cues for sequencing;Verbal cues for technique;Verbal cues for safe use of DME/AE;Verbal cues for precautions/safety   Stand to Sit Details cues for safer technique with sitting using loftstrands     Ambulation/Gait   Ambulation/Gait Yes   Ambulation/Gait Assistance 4: Min guard;5: Supervision   Ambulation/Gait Assistance Details cues on posture, step length and sequencing with loftstrands   Ambulation Distance (Feet) 120 Feet x1, 70 feet x2   Assistive device Lofstrands;Prosthesis   Gait Pattern Step-through pattern;Decreased step length - left;Decreased stance time - right;Decreased stride length;Decreased hip/knee flexion - left;Trunk flexed;Abducted- right   Ambulation Surface Level;Indoor   Ramp 5: Supervision  with loftstrands/prosthesis   Ramp Details (  indicate cue type and reason) cues on sequencing and posture   Curb 5: Supervision  with loftstrands/prosthesis   Curb Details (indicate cue type and reason) cues on sequencing and technique     Knee/Hip Exercises: Stretches   Hip Flexor Stretch Right;1 rep;60 seconds;Limitations   Hip Flexor Stretch Limitations unable to tolerate full stretech, therefore placed up to 8 inch box (2 boxes stacked) under pt's prosthetic foot to achieve a stretch that was tolerable up to 1 minute only. Pt with increased low back pain with  bringing right prosthesis back onto mat and refused to stretch left side due to back pain.                           Knee/Hip Exercises: Supine   Bridges Limitations bil LE bridging x 10 reps with arms at sides, cues on form. cues to hold for 3-5 seconds with poor pt follow through     [place] Comments  Reinforced need for increasing wear time with gradual build up.   Current prosthetic wear tolerance (days/week)  daily   Current prosthetic wear tolerance (#hours/day)  wore prosthesis for about a 6-7 hour stretch yesterday with no break   Residual limb condition  reports intact   [place] Prosthesis Supervision            PT Short Term Goals - 06/29/16 0930      PT SHORT TERM GOAL #1   Title Patient tolerates wear of prosthesis daily >5 hrs without limb pain. (Target Date: 06/30/2016)   Baseline MET 06/28/2016 he reports some "pressure" but releases part of suction to alleviate issue   Time 4   Period Weeks   Status Achieved     PT SHORT TERM GOAL #2   Title Patient ambulates 200' with RW & prosthesis with supervision. (Target Date: 06/30/2016)   Baseline MET 06/28/2016   Time 4   Period Weeks   Status Achieved     PT SHORT TERM GOAL #3   Title Patient negotiates ramps, curbs with RW and stairs with 1 rail with prosthesis with supervision. (Target Date: 06/30/2016)   Baseline MET 06/28/2016    Time 4   Period Weeks   Status Achieved     PT SHORT TERM GOAL #4   Title Patient reaches 10" and to floor with UE support modified independent. (Target Date: 06/30/2016)   Baseline MET 06/29/2016   Time 4   Period Weeks   Status Achieved     PT SHORT TERM GOAL #5   Title Patient reports left knee pain <6/10 with standing & gait activities. (Target Date: 06/30/2016)   Baseline MET 06/28/2016 left knee pain "not really" hurting with standing & gait activities.    Time 4   Period Weeks   Status Achieved           PT Long Term Goals - 06/21/16 1558      PT LONG  TERM GOAL #1   Title Patient tolerates prosthesis wear >80% of awake hours without limb pain or skin issues. (Target Date: 07/28/2016)   Time 8   Period Weeks   Status On-going     PT LONG TERM GOAL #2   Title Patient ambulates 400' with LRAD & prosthesis modified independent for community mobility. (Target Date: 07/28/2016)   Time 8   Period Weeks   Status On-going     PT LONG TERM GOAL #3   Title  Patient negotiates ramps, curbs & stairs with LRAD & prosthesis modified independent for community access. (Target Date: 07/28/2016)   Time 8   Period Weeks   Status On-going     PT LONG TERM GOAL #4   Title Patient ambulates 27' with single crutch or less and prosthesis modified independent for household mobility. (Target Date: 07/28/2016)   Time 8   Period Weeks   Status On-going     PT LONG TERM GOAL #5   Title [place] Balance >45/56 to indicate lower fall risk. (Target Date: 07/28/2016)   Time 8   Period Weeks   Status On-going            Plan - 07/14/16 0855    Clinical Impression Statement Today's skilled session continued to address gait using loftstrands/prosthesis and bil hip flexor stretching/LE strengthening. Pt continues to report increased pain with all activities, needing rest breaks. Pt is making slow progress toward goals. Pt should benefit from continued PT to progress toward unmet goals.                                          Rehab Potential Good   PT Frequency 2x / week   PT Duration 8 weeks   PT Treatment/Interventions ADLs/Self Care Home Management;Moist Heat;Ultrasound;DME Instruction;Gait training;Stair training;Functional mobility training;Therapeutic activities;Therapeutic exercise;Balance training;Neuromuscular re-education;Patient/family education;Prosthetic Training   PT Next Visit Plan begin to assess LTGs as PT plan of care ends next week   Consulted and Agree with Plan of Care Patient      Patient will benefit from skilled therapeutic intervention in  order to improve the following deficits and impairments:  Abnormal gait, Decreased activity tolerance, Decreased balance, Decreased coordination, Decreased mobility, Decreased strength, Difficulty walking, Decreased knowledge of use of DME, Impaired flexibility, Postural dysfunction, Prosthetic Dependency, Pain  Visit Diagnosis: Muscle weakness (generalized)  Other abnormalities of gait and mobility  Unsteadiness on feet  Pain in left knee  Difficulty in walking, not elsewhere classified     Problem List Patient Active Problem List   Diagnosis Date Noted  . Long term current use of opiate analgesic 12/17/2015  . Debilitated 09/22/2015  . Renal tubular acidosis   . AIDS ([place])   . History of above knee amputation ([place])   . SBO (small bowel obstruction) ([place])   . Pressure ulcer 09/13/2015  . Protein-calorie malnutrition, severe 09/13/2015  . Polyuria 08/06/2015  . Prediabetes 08/06/2015  . Hematuria 08/06/2015  . UTI (urinary tract infection) 05/14/2015  . Avascular necrosis of femoral head ([place]) 05/14/2015  . Left adrenal mass ([place]) 05/14/2015  . CKD (chronic kidney disease) 02/10/2014  . Hyperglycemia 12/29/2013  . Unintentional weight loss 11/04/2013  . Left hip pain 03/09/2013  . Preventative health care 03/09/2013  . Phantom limb syndrome with pain ([place]) 03/21/2012  . Long term current use of anticoagulant 11/20/2010  . ERECTILE DYSFUNCTION, ORGANIC 04/12/2010  . GERD 08/23/2009  . METHICILLIN RESISTANT STAPHYLOCOCCUS AUREUS INFECTION 08/05/2009  . ENLARGEMENT OF LYMPH NODES 07/20/2009  . Embolism and thrombosis of artery ([place]) 07/19/2009  . HYPOGONADISM 07/15/2009  . DEFICIENCY OF OTHER VITAMINS 07/15/2009  . PULMONARY NODULE 07/15/2009  . Status post above knee amputation ([place]) 07/15/2009  . Human immunodeficiency virus (HIV) disease ([place]) 11/10/2006  . SHINGLES, RECURRENT 11/10/2006  . HEPATITIS B 11/10/2006  . DYSLIPIDEMIA 11/10/2006  . PSYCHOSIS 11/10/2006   . Alcohol abuse 11/10/2006  .  CIGARETTE SMOKER 11/10/2006  . PERIPHERAL NEUROPATHY 11/10/2006  . ALLERGIC RHINITIS 11/10/2006  . DENTAL CARIES 11/10/2006    Willow Ora, PTA, [facility] [address] [place], [place] [address] [phone] 07/14/16, 10:14 AM   Name: Quayshawn Nin MRN: [id number] Date of Birth: 07/24/1961

## 2016-07-17 ENCOUNTER — Other Ambulatory Visit: Payer: Self-pay

## 2016-07-17 DIAGNOSIS — G546 Phantom limb syndrome with pain: Secondary | ICD-10-CM

## 2016-07-17 DIAGNOSIS — G8929 Other chronic pain: Secondary | ICD-10-CM

## 2016-07-17 MED ORDER — MORPHINE SULFATE ER 30 MG PO TBCR: 90.0000 mg | EXTENDED_RELEASE_TABLET | Freq: Three times a day (TID) | ORAL | 0 refills | Status: DC

## 2016-07-17 MED ORDER — OXYCODONE HCL 5 MG PO TABS: 5.0000 mg | ORAL_TABLET | Freq: Two times a day (BID) | ORAL | 0 refills | Status: DC | PRN

## 2016-07-19 ENCOUNTER — Ambulatory Visit: Payer: Medicare Other | Admitting: Physical Therapy

## 2016-07-19 ENCOUNTER — Encounter: Payer: Self-pay | Admitting: Physical Therapy

## 2016-07-19 DIAGNOSIS — R2681 Unsteadiness on feet: Secondary | ICD-10-CM | POA: Diagnosis not present

## 2016-07-19 DIAGNOSIS — M25562 Pain in left knee: Secondary | ICD-10-CM | POA: Diagnosis not present

## 2016-07-19 DIAGNOSIS — R2689 Other abnormalities of gait and mobility: Secondary | ICD-10-CM

## 2016-07-19 DIAGNOSIS — R262 Difficulty in walking, not elsewhere classified: Secondary | ICD-10-CM | POA: Diagnosis not present

## 2016-07-19 DIAGNOSIS — M6281 Muscle weakness (generalized): Secondary | ICD-10-CM | POA: Diagnosis not present

## 2016-07-19 NOTE — Therapy (Signed)
[facility] [address] [place], [place], [address] Phone: [phone]   Fax:  [phone]  Physical Therapy Treatment  Patient Details  Name: [name] MRN: [id number] Date of Birth: 14-Aug-1961 Referring Provider: Burgess Estelle, MD resident Joni Reining, DO)  Encounter Date: 07/19/2016      PT End of Session - 07/19/16 1206    Visit Number 13   Number of Visits 17   Date for PT Re-Evaluation 07/28/16   Authorization Type Medicare G-code & progress note   PT Start Time 0852   PT Stop Time 0930   PT Time Calculation (min) 38 min   Equipment Utilized During Treatment Gait belt   Activity Tolerance Patient limited by pain   Behavior During Therapy [facility] for tasks assessed/performed      Past Medical History:  Diagnosis Date  . Chronic kidney disease   . History of chicken pox   . History of DVT of lower extremity    right  . HIV (human immunodeficiency virus infection) ([place])   . Hyperlipidemia   . Left hip pain 03/09/2013  . Pneumonia   . Protein malnutrition ([place]) 08/2015    Past Surgical History:  Procedure Laterality Date  . APPENDECTOMY  1962  . LEG AMPUTATION ABOVE KNEE  2010   right leg    There were no vitals filed for this visit.      Subjective Assessment - 07/19/16 0856    Subjective He has wearing prosthesis daily most of awake hours except removes prosthesis leaves liner on limb.    Pertinent History Right AKA, HIV, shingles, DVT, Lt hip pain, CKD, Hep B,    Limitations Lifting;Standing;Walking   Patient Stated Goals He wants to work on walking & "get muscles toned up"   Currently in Pain? Yes   Pain Score 4    Pain Orientation Right   Pain Descriptors / Indicators Aching   Pain Onset More than a month ago   Pain Frequency Intermittent   Aggravating Factors  excessive UE weight bearing in standing   Pain Relieving Factors rest     Gait Training with Transfemoral  prosthesis: Patient ambulated 100' & 150' with forearm crutches with 4 pt pattern with cues on posture including looking where ambulating to facilitate upright posture. PT reviewed increasing activity level outside of PT with high frequency of short walks, 4-6 medium distance walks & 1-2 long (his max tolerance) walks per day. He verbalizes understanding but reports minimal carryover.  Neuromuscular Re-education: Standing hip flexor stretch with BUE support with cues on technique. 30 sec hold 2 reps /LE. Seated exercises including handout for home: trunk flexion to trunk extension with scapular retraction / shoulder extension, trunk rotation and weight shift lateral with overhead reach.  Standing at counter with chair back support: trunk flexion to trunk extension with scapular retraction / shoulder extension, trunk rotation, weight shift lateral with overhead reach and turning clockwise & counter clockwise with alternate weighting LEs and turning unweighted LE 90*.                              PT Short Term Goals - 06/29/16 0930      PT SHORT TERM GOAL #1   Title Patient tolerates wear of prosthesis daily >5 hrs without limb pain. (Target Date: 06/30/2016)   Baseline MET 06/28/2016 he reports some "pressure" but releases part of suction to alleviate issue   Time  4   Period Weeks   Status Achieved     PT SHORT TERM GOAL #2   Title Patient ambulates 200' with RW & prosthesis with supervision. (Target Date: 06/30/2016)   Baseline MET 06/28/2016   Time 4   Period Weeks   Status Achieved     PT SHORT TERM GOAL #3   Title Patient negotiates ramps, curbs with RW and stairs with 1 rail with prosthesis with supervision. (Target Date: 06/30/2016)   Baseline MET 06/28/2016    Time 4   Period Weeks   Status Achieved     PT SHORT TERM GOAL #4   Title Patient reaches 10" and to floor with UE support modified independent. (Target Date: 06/30/2016)   Baseline MET 06/29/2016   Time 4    Period Weeks   Status Achieved     PT SHORT TERM GOAL #5   Title Patient reports left knee pain <6/10 with standing & gait activities. (Target Date: 06/30/2016)   Baseline MET 06/28/2016 left knee pain "not really" hurting with standing & gait activities.    Time 4   Period Weeks   Status Achieved           PT Long Term Goals - 06/21/16 1558      PT LONG TERM GOAL #1   Title Patient tolerates prosthesis wear >80% of awake hours without limb pain or skin issues. (Target Date: 07/28/2016)   Time 8   Period Weeks   Status On-going     PT LONG TERM GOAL #2   Title Patient ambulates 400' with LRAD & prosthesis modified independent for community mobility. (Target Date: 07/28/2016)   Time 8   Period Weeks   Status On-going     PT LONG TERM GOAL #3   Title Patient negotiates ramps, curbs & stairs with LRAD & prosthesis modified independent for community access. (Target Date: 07/28/2016)   Time 8   Period Weeks   Status On-going     PT LONG TERM GOAL #4   Title Patient ambulates 94' with single crutch or less and prosthesis modified independent for household mobility. (Target Date: 07/28/2016)   Time 8   Period Weeks   Status On-going     PT LONG TERM GOAL #5   Title Berg Balance >45/56 to indicate lower fall risk. (Target Date: 07/28/2016)   Time 8   Period Weeks   Status On-going               Plan - 07/19/16 1208    Clinical Impression Statement Patient appears to understand updated HEP seated & standing exercises to facilitate upright posture & improved trunk flexibility/ balance reactions. He is on target to meet LTGs by end of next week.    Rehab Potential Good   PT Frequency 2x / week   PT Duration 8 weeks   PT Treatment/Interventions ADLs/Self Care Home Management;Moist Heat;Ultrasound;DME Instruction;Gait training;Stair training;Functional mobility training;Therapeutic activities;Therapeutic exercise;Balance training;Neuromuscular re-education;Patient/family  education;Prosthetic Training   PT Next Visit Plan begin to assess LTGs as PT plan of care ends next week   Consulted and Agree with Plan of Care Patient      Patient will benefit from skilled therapeutic intervention in order to improve the following deficits and impairments:  Abnormal gait, Decreased activity tolerance, Decreased balance, Decreased coordination, Decreased mobility, Decreased strength, Difficulty walking, Decreased knowledge of use of DME, Impaired flexibility, Postural dysfunction, Prosthetic Dependency, Pain  Visit Diagnosis: Muscle weakness (generalized)  Other abnormalities of gait and  mobility  Unsteadiness on feet  Pain in left knee     Problem List Patient Active Problem List   Diagnosis Date Noted  . Long term current use of opiate analgesic 12/17/2015  . Debilitated 09/22/2015  . Renal tubular acidosis   . AIDS ([place])   . History of above knee amputation ([place])   . SBO (small bowel obstruction) ([place])   . Pressure ulcer 09/13/2015  . Protein-calorie malnutrition, severe 09/13/2015  . Polyuria 08/06/2015  . Prediabetes 08/06/2015  . Hematuria 08/06/2015  . UTI (urinary tract infection) 05/14/2015  . Avascular necrosis of femoral head ([place]) 05/14/2015  . Left adrenal mass ([place]) 05/14/2015  . CKD (chronic kidney disease) 02/10/2014  . Hyperglycemia 12/29/2013  . Unintentional weight loss 11/04/2013  . Left hip pain 03/09/2013  . Preventative health care 03/09/2013  . Phantom limb syndrome with pain ([place]) 03/21/2012  . Long term current use of anticoagulant 11/20/2010  . ERECTILE DYSFUNCTION, ORGANIC 04/12/2010  . GERD 08/23/2009  . METHICILLIN RESISTANT STAPHYLOCOCCUS AUREUS INFECTION 08/05/2009  . ENLARGEMENT OF LYMPH NODES 07/20/2009  . Embolism and thrombosis of artery ([place]) 07/19/2009  . HYPOGONADISM 07/15/2009  . DEFICIENCY OF OTHER VITAMINS 07/15/2009  . PULMONARY NODULE 07/15/2009  . Status post above knee amputation ([place]) 07/15/2009  .  Human immunodeficiency virus (HIV) disease ([place]) 11/10/2006  . SHINGLES, RECURRENT 11/10/2006  . HEPATITIS B 11/10/2006  . DYSLIPIDEMIA 11/10/2006  . PSYCHOSIS 11/10/2006  . Alcohol abuse 11/10/2006  . CIGARETTE SMOKER 11/10/2006  . PERIPHERAL NEUROPATHY 11/10/2006  . ALLERGIC RHINITIS 11/10/2006  . DENTAL CARIES 11/10/2006    Jamey Reas PT, DPT 07/19/2016, 12:11 PM  [facility] [address] [place], [place], [address] Phone: [phone]   Fax:  [phone]  Name: Tanyon Alipio MRN: [id number] Date of Birth: 09/05/1961

## 2016-07-21 ENCOUNTER — Ambulatory Visit: Payer: Medicare Other | Admitting: Physical Therapy

## 2016-07-24 ENCOUNTER — Ambulatory Visit: Payer: Medicare Other

## 2016-07-26 ENCOUNTER — Ambulatory Visit: Payer: Medicare Other | Admitting: Physical Therapy

## 2016-07-27 ENCOUNTER — Ambulatory Visit: Payer: Medicare Other | Admitting: Pharmacist

## 2016-07-27 DIAGNOSIS — Z7901 Long term (current) use of anticoagulants: Secondary | ICD-10-CM

## 2016-07-27 DIAGNOSIS — I749 Embolism and thrombosis of unspecified artery: Secondary | ICD-10-CM

## 2016-07-28 ENCOUNTER — Encounter: Payer: Medicare Other | Admitting: Physical Therapy

## 2016-08-06 ENCOUNTER — Other Ambulatory Visit: Payer: Self-pay | Admitting: Internal Medicine

## 2016-08-10 ENCOUNTER — Ambulatory Visit: Payer: Medicare Other | Admitting: Physical Therapy

## 2016-08-11 ENCOUNTER — Ambulatory Visit: Payer: Medicare Other | Admitting: Physical Therapy

## 2016-08-15 ENCOUNTER — Other Ambulatory Visit: Payer: Self-pay | Admitting: *Deleted

## 2016-08-15 DIAGNOSIS — G546 Phantom limb syndrome with pain: Secondary | ICD-10-CM

## 2016-08-15 DIAGNOSIS — G894 Chronic pain syndrome: Secondary | ICD-10-CM

## 2016-08-15 MED ORDER — MORPHINE SULFATE ER 30 MG PO TBCR: 90.0000 mg | EXTENDED_RELEASE_TABLET | Freq: Three times a day (TID) | ORAL | 0 refills | Status: DC

## 2016-08-15 MED ORDER — OXYCODONE HCL 5 MG PO TABS: 5.0000 mg | ORAL_TABLET | Freq: Two times a day (BID) | ORAL | 0 refills | Status: DC | PRN

## 2016-08-16 ENCOUNTER — Encounter: Payer: Medicare Other | Admitting: Physical Therapy

## 2016-08-18 ENCOUNTER — Encounter: Payer: Medicare Other | Admitting: Physical Therapy

## 2016-08-28 ENCOUNTER — Ambulatory Visit (INDEPENDENT_AMBULATORY_CARE_PROVIDER_SITE_OTHER): Payer: Medicare Other | Admitting: Pharmacist

## 2016-08-28 DIAGNOSIS — I749 Embolism and thrombosis of unspecified artery: Secondary | ICD-10-CM

## 2016-08-28 DIAGNOSIS — Z7901 Long term (current) use of anticoagulants: Secondary | ICD-10-CM

## 2016-08-28 LAB — POCT INR: INR: 2.8

## 2016-08-28 NOTE — Progress Notes (Signed)
Anti-Coagulation Progress Note  [name] is a 55 y.o. male who is currently on an anti-coagulation regimen.    RECENT RESULTS: Recent results are below, the most recent result is correlated with a dose of 37.5 mg. per week: Lab Results  Component Value Date   INR 2.80 08/28/2016   INR 2.50 06/26/2016   INR 2.0 05/29/2016   PROTIME 38.4 (H) 04/01/2012    ANTI-COAG DOSE: Anticoagulation Dose Instructions as of 08/28/2016      [name] Tue Wed Thu Fri Sat   New Dose 5 mg 7.5 mg 5 mg 5 mg 5 mg 5 mg 5 mg    Description   Take 1&1/2 tablets on Mondays of each week; all other days of week--take only 1 tablet of your warfarin.       ANTICOAG SUMMARY: Anticoagulation Episode Summary    Current INR goal:   2.0-3.0  TTR:   63.6 % (5.6 y)  Next INR check:   09/25/2016  INR from last check:   2.80 (08/28/2016)  Weekly max dose:     Target end date:   Indefinite  [facility] check [facility]   [facility]  Preferred lab:     Send INR reminders to:   ANTICOAG IMP   Indications   Embolism and thrombosis of artery ([place]) [I74.9] Long term current use of anticoagulant [Z79.01]       Comments:         Anticoagulation Care Providers    Provider Role Specialty Phone number   Michel Bickers, MD  Infectious Diseases [phone]      ANTICOAG TODAY: Anticoagulation Summary  As of 08/28/2016   INR goal:   2.0-3.0  TTR:     Today's INR:   2.80  Next INR check:   09/25/2016  Target end date:   Indefinite   Indications   Embolism and thrombosis of artery ([place]) [I74.9] Long term current use of anticoagulant [Z79.01]        Anticoagulation Episode Summary    [facility]   [facility]   Preferred lab:      Send INR reminders to:   ANTICOAG IMP   Comments:       Anticoagulation Care Providers    Provider Role Specialty Phone number   Michel Bickers, MD  Infectious Diseases [phone]      PATIENT INSTRUCTIONS: Patient instructed to take 1 tablet daily--EXCEPT on  Mondays, take 1&1/2 tablets on Mondays of each week.   FOLLOW-UP Return in 4 weeks (on 09/25/2016) for Follow up INR at 1030h.  Jorene Guest, [name] Pharm.D., CACP

## 2016-08-28 NOTE — Patient Instructions (Signed)
Patient instructed to take medications as defined in the Anti-coagulation Track section of this encounter.  Patient instructed to take today's dose.  Patient instructed to take 1 tablet daily--EXCEPT on Mondays, take 1&1/2 tablets on Mondays of each week.  Patient verbalized understanding of these instructions.

## 2016-08-28 NOTE — Progress Notes (Signed)
Indication: Arterial thrombus. Duration: Indefinite. INR: At target. Agree with Dr. Gladstone Pih assessment and plan.

## 2016-09-05 ENCOUNTER — Telehealth: Payer: Self-pay

## 2016-09-12 ENCOUNTER — Other Ambulatory Visit: Payer: Self-pay | Admitting: *Deleted

## 2016-09-12 DIAGNOSIS — G546 Phantom limb syndrome with pain: Secondary | ICD-10-CM

## 2016-09-12 DIAGNOSIS — G894 Chronic pain syndrome: Secondary | ICD-10-CM

## 2016-09-12 MED ORDER — OXYCODONE HCL 5 MG PO TABS: 5.0000 mg | ORAL_TABLET | Freq: Two times a day (BID) | ORAL | 0 refills | Status: DC | PRN

## 2016-09-12 MED ORDER — MORPHINE SULFATE ER 30 MG PO TBCR: 90.0000 mg | EXTENDED_RELEASE_TABLET | Freq: Three times a day (TID) | ORAL | 0 refills | Status: DC

## 2016-09-13 ENCOUNTER — Other Ambulatory Visit: Payer: Self-pay | Admitting: Internal Medicine

## 2016-09-25 ENCOUNTER — Encounter: Payer: Self-pay | Admitting: Physical Therapy

## 2016-09-25 ENCOUNTER — Ambulatory Visit (INDEPENDENT_AMBULATORY_CARE_PROVIDER_SITE_OTHER): Payer: Medicare Other | Admitting: Pharmacist

## 2016-09-25 DIAGNOSIS — I749 Embolism and thrombosis of unspecified artery: Secondary | ICD-10-CM | POA: Diagnosis not present

## 2016-09-25 DIAGNOSIS — Z7901 Long term (current) use of anticoagulants: Secondary | ICD-10-CM | POA: Diagnosis present

## 2016-09-25 LAB — POCT INR: INR: 2.7

## 2016-09-25 NOTE — Therapy (Signed)
[facility] [address] [place], [place], [address] Phone: [phone]   Fax:  [phone]  Patient Details  Name: [name] MRN: [id number] Date of Birth: January 13, 1961 Referring Provider:  No ref. provider found  Encounter Date: 09/25/2016  PHYSICAL THERAPY DISCHARGE SUMMARY  Visits from Start of Care: 13  Current functional level related to goals / functional outcomes:     PT Long Term Goals - 06/21/16 1558      PT LONG TERM GOAL #1   Title Patient tolerates prosthesis wear >80% of awake hours without limb pain or skin issues. (Target Date: 07/28/2016)   Time 8   Period Weeks   Status On-going     PT LONG TERM GOAL #2   Title Patient ambulates 400' with LRAD & prosthesis modified independent for community mobility. (Target Date: 07/28/2016)   Time 8   Period Weeks   Status On-going     PT LONG TERM GOAL #3   Title Patient negotiates ramps, curbs & stairs with LRAD & prosthesis modified independent for community access. (Target Date: 07/28/2016)   Time 8   Period Weeks   Status On-going     PT LONG TERM GOAL #4   Title Patient ambulates 75' with single crutch or less and prosthesis modified independent for household mobility. (Target Date: 07/28/2016)   Time 8   Period Weeks   Status On-going     PT LONG TERM GOAL #5   Title Berg Balance >45/56 to indicate lower fall risk. (Target Date: 07/28/2016)   Time 8   Period Weeks   Status On-going       Remaining deficits: Patient did not complete his plan of care and did not return for last few appointments so PT unable to reassess.     Education / Equipment: HEP  Plan: Patient agrees to discharge.  Patient goals were not met. Patient is being discharged due to not returning since the last visit.  ?????        Kenzleigh Sedam PT, DPT 09/25/2016, 12:56 PM  [facility] [address] [place], [place], [address] Phone: [phone]   Fax:  [phone]

## 2016-09-25 NOTE — Patient Instructions (Signed)
Patient instructed to take medications as defined in the Anti-coagulation Track section of this encounter.  Patient instructed to take today's dose.  Patient instructed to take 1&1/2 tablets on Mondays of each week; all other days of week--take only 1 tablet of your warfarin.  Patient verbalized understanding of these instructions.

## 2016-09-25 NOTE — Progress Notes (Signed)
Anti-Coagulation Progress Note  [name] is a 55 y.o. male who is currently on an anti-coagulation regimen.    RECENT RESULTS: Recent results are below, the most recent result is correlated with a dose of 37.5 mg. per week: Lab Results  Component Value Date   INR 2.70 09/25/2016   INR 2.80 08/28/2016   INR 2.50 06/26/2016   PROTIME 38.4 (H) 04/01/2012    ANTI-COAG DOSE: Anticoagulation Dose Instructions as of 09/25/2016      [name] Tue Wed Thu Fri Sat   New Dose 5 mg 7.5 mg 5 mg 5 mg 5 mg 5 mg 5 mg    Description   Take 1&1/2 tablets on Mondays of each week; all other days of week--take only 1 tablet of your warfarin.       ANTICOAG SUMMARY: Anticoagulation Episode Summary    Current INR goal:   2.0-3.0  TTR:   64.1 % (5.7 y)  Next INR check:   11/06/2016  INR from last check:   2.70 (09/25/2016)  Weekly max dose:     Target end date:   Indefinite  [facility]  Preferred lab:     Send INR reminders to:   ANTICOAG IMP   Indications   Embolism and thrombosis of artery ([place]) [I74.9] Long term current use of anticoagulant [Z79.01]       Comments:         Anticoagulation Care Providers    Provider Role Specialty Phone number   Michel Bickers, MD  Infectious Diseases [phone]      ANTICOAG TODAY: Anticoagulation Summary  As of 09/25/2016   INR goal:   2.0-3.0  TTR:     Today's INR:   2.70  Next INR check:   11/06/2016  Target end date:   Indefinite   Indications   Embolism and thrombosis of artery ([place]) [I74.9] Long term current use of anticoagulant [Z79.01]        Anticoagulation Episode Summary    [facility]   [facility]   Preferred lab:      Send INR reminders to:   ANTICOAG IMP   Comments:       Anticoagulation Care Providers    Provider Role Specialty Phone number   Michel Bickers, MD  Infectious Diseases [phone]      PATIENT INSTRUCTIONS: Patient Instructions  Patient instructed to take  medications as defined in the Anti-coagulation Track section of this encounter.  Patient instructed to take today's dose.  Patient instructed to take 1&1/2 tablets on Mondays of each week; all other days of week--take only 1 tablet of your warfarin.  Patient verbalized understanding of these instructions.       FOLLOW-UP Return in 6 weeks (on 11/06/2016) for Follow up INR at 1045h.  Jorene Guest, [name] Pharm.D., CACP

## 2016-09-26 NOTE — Progress Notes (Signed)
INTERNAL MEDICINE TEACHING ATTENDING ADDENDUM - Lucious Groves, DO Duration- indeninfate, Indication- arterial thrombus, INR-  Lab Results  Component Value Date   INR 2.70 09/25/2016   PROTIME 38.4 (H) 04/01/2012  . Agree with pharmacy recommendations as outlined in their note.

## 2016-10-12 ENCOUNTER — Other Ambulatory Visit: Payer: Self-pay | Admitting: *Deleted

## 2016-10-12 DIAGNOSIS — G894 Chronic pain syndrome: Secondary | ICD-10-CM

## 2016-10-12 DIAGNOSIS — G546 Phantom limb syndrome with pain: Secondary | ICD-10-CM

## 2016-10-12 MED ORDER — OXYCODONE HCL 5 MG PO TABS: 5.0000 mg | ORAL_TABLET | Freq: Two times a day (BID) | ORAL | 0 refills | Status: DC | PRN

## 2016-10-12 MED ORDER — MORPHINE SULFATE ER 30 MG PO TBCR: 90.0000 mg | EXTENDED_RELEASE_TABLET | Freq: Three times a day (TID) | ORAL | 0 refills | Status: DC

## 2016-10-31 ENCOUNTER — Encounter: Payer: Self-pay | Admitting: Internal Medicine

## 2016-10-31 DIAGNOSIS — F1120 Opioid dependence, uncomplicated: Secondary | ICD-10-CM | POA: Insufficient documentation

## 2016-11-04 ENCOUNTER — Other Ambulatory Visit: Payer: Self-pay | Admitting: Internal Medicine

## 2016-11-07 ENCOUNTER — Ambulatory Visit (INDEPENDENT_AMBULATORY_CARE_PROVIDER_SITE_OTHER): Payer: Medicare Other | Admitting: Pharmacist

## 2016-11-07 ENCOUNTER — Other Ambulatory Visit: Payer: Self-pay | Admitting: Internal Medicine

## 2016-11-07 ENCOUNTER — Encounter (INDEPENDENT_AMBULATORY_CARE_PROVIDER_SITE_OTHER): Payer: Self-pay

## 2016-11-07 DIAGNOSIS — Z7901 Long term (current) use of anticoagulants: Secondary | ICD-10-CM

## 2016-11-07 DIAGNOSIS — B20 Human immunodeficiency virus [HIV] disease: Secondary | ICD-10-CM

## 2016-11-07 DIAGNOSIS — I749 Embolism and thrombosis of unspecified artery: Secondary | ICD-10-CM

## 2016-11-07 LAB — POCT INR: INR: 3.3

## 2016-11-07 MED ORDER — DARUNAVIR-COBICISTAT 800-150 MG PO TABS
1.0000 | ORAL_TABLET | Freq: Every day | ORAL | 5 refills | Status: DC
Start: 2016-11-07 — End: 2017-05-31

## 2016-11-07 MED ORDER — DOLUTEGRAVIR SODIUM 50 MG PO TABS: 50.0000 mg | ORAL_TABLET | Freq: Every day | ORAL | 5 refills | Status: DC

## 2016-11-07 NOTE — Patient Instructions (Signed)
Patient educated about medication as defined in this encounter and verbalized understanding by repeating back instructions provided.   

## 2016-11-07 NOTE — Progress Notes (Signed)
Anticoagulation Management [name] is a 56 y.o. male who reports to the clinic for monitoring of warfarin treatment.    Indication: history of DVT and arterial thrombus Duration: indefinite  Anticoagulation Clinic Visit History: Patient does not report signs/symptoms of bleeding or thromboembolism. Patient reports changes in dietary vitamin K intake.  Anticoagulation Episode Summary    Current INR goal:   2.0-3.0  TTR:   63.8 % (5.8 y)  Next INR check:   12/04/2016  INR from last check:   3.3! (11/07/2016)  Weekly max dose:     Target end date:   Indefinite  [facility]  Preferred lab:     Send INR reminders to:   ANTICOAG IMP   Indications   Embolism and thrombosis of artery ([place]) [I74.9] Long term current use of anticoagulant [Z79.01]       Comments:         Anticoagulation Care Providers    Provider Role Specialty Phone number   Michel Bickers, MD  Infectious Diseases [phone]     ASSESSMENT Recent Results: The most recent result is correlated with 37.5 mg per week: Lab Results  Component Value Date   INR 3.3 11/07/2016   INR 2.70 09/25/2016   INR 2.80 08/28/2016   PROTIME 38.4 (H) 04/01/2012   Anticoagulation Dosing: INR as of 11/07/2016 and Previous Dosing Information    INR Dt INR Goal Wkly Tot Sun Mon Tue Wed Thu Fri Sat   11/07/2016 3.3 2.0-3.0 37.5 mg 5 mg 7.5 mg 5 mg 5 mg 5 mg 5 mg 5 mg    Previous description   Take 1&1/2 tablets on Mondays of each week; all other days of week--take only 1 tablet of your warfarin.    Anticoagulation Dose Instructions as of 11/07/2016      Total Sun Mon Tue Wed Thu Fri Sat   New Dose 37.5 mg 5 mg 7.5 mg 5 mg 5 mg 5 mg 5 mg 5 mg     (5 mg x 1)  (5 mg x 1.5)  (5 mg x 1)  (5 mg x 1)  (5 mg x 1)  (5 mg x 1)  (5 mg x 1)                         Description   Take 1&1/2 tablets on Mondays of each week; all other days of week--take only 1 tablet of your warfarin.      INR today:  Supratherapeutic  PLAN Educated patient on consistent dietary vitamin K intake, continue current dosing and follow up sooner  Patient Instructions  Patient educated about medication as defined in this encounter and verbalized understanding by repeating back instructions provided.   Patient advised to contact clinic or seek medical attention if signs/symptoms of bleeding or thromboembolism occur.  Patient verbalized understanding by repeating back information and was advised to contact me if further medication-related questions arise. Patient was also provided an information handout.  Follow-up 12/04/16  Joseth Weigel J  15 minutes spent face-to-face with the patient during the encounter. 50% of time spent on education. 50% of time was spent on assessment and plan.

## 2016-11-08 NOTE — Progress Notes (Signed)
INTERNAL MEDICINE TEACHING ATTENDING ADDENDUM - Lalla Brothers M.D  Duration- indefinite, Indication- VTE, INR- supratherapeutic. Agree with pharmacy recommendations as outlined in their note.

## 2016-11-13 ENCOUNTER — Other Ambulatory Visit: Payer: Self-pay | Admitting: *Deleted

## 2016-11-13 DIAGNOSIS — G894 Chronic pain syndrome: Secondary | ICD-10-CM

## 2016-11-13 DIAGNOSIS — G546 Phantom limb syndrome with pain: Secondary | ICD-10-CM

## 2016-11-13 MED ORDER — OXYCODONE HCL 5 MG PO TABS: 5.0000 mg | ORAL_TABLET | Freq: Two times a day (BID) | ORAL | 0 refills | Status: DC | PRN

## 2016-11-13 MED ORDER — MORPHINE SULFATE ER 30 MG PO TBCR: 90.0000 mg | EXTENDED_RELEASE_TABLET | Freq: Three times a day (TID) | ORAL | 0 refills | Status: DC

## 2016-11-20 ENCOUNTER — Other Ambulatory Visit: Payer: Self-pay | Admitting: *Deleted

## 2016-11-20 DIAGNOSIS — R634 Abnormal weight loss: Secondary | ICD-10-CM

## 2016-11-20 MED ORDER — ENSURE PO LIQD: 1.0000 | Freq: Three times a day (TID) | ORAL | 99 refills | Status: DC

## 2016-11-21 ENCOUNTER — Other Ambulatory Visit: Payer: Medicare Other

## 2016-11-23 ENCOUNTER — Other Ambulatory Visit: Payer: Self-pay | Admitting: *Deleted

## 2016-11-23 DIAGNOSIS — R634 Abnormal weight loss: Secondary | ICD-10-CM

## 2016-11-23 MED ORDER — ENSURE PO LIQD: 1.0000 | Freq: Three times a day (TID) | ORAL | 99 refills | Status: DC

## 2016-12-04 ENCOUNTER — Ambulatory Visit: Payer: Medicare Other

## 2016-12-05 ENCOUNTER — Ambulatory Visit: Payer: Medicare Other | Admitting: Internal Medicine

## 2016-12-11 ENCOUNTER — Ambulatory Visit (INDEPENDENT_AMBULATORY_CARE_PROVIDER_SITE_OTHER): Payer: Medicare Other | Admitting: Pharmacist

## 2016-12-11 ENCOUNTER — Encounter (INDEPENDENT_AMBULATORY_CARE_PROVIDER_SITE_OTHER): Payer: Self-pay

## 2016-12-11 DIAGNOSIS — Z7901 Long term (current) use of anticoagulants: Secondary | ICD-10-CM

## 2016-12-11 DIAGNOSIS — I749 Embolism and thrombosis of unspecified artery: Secondary | ICD-10-CM

## 2016-12-11 LAB — POCT INR: INR: 3.1

## 2016-12-11 NOTE — Patient Instructions (Signed)
Patient instructed to take medications as defined in the Anti-coagulation Track section of this encounter.  Patient instructed to OMT today's dose.  Take 1 tablet by mouth once-daily--all days of week, EXCEPT on Tuesdays--take only 1/2 tablet on Tuesdays of each week. OMIT dose scheduled for Monday 12-FEB-18.  Patient verbalized understanding of these instructions.

## 2016-12-11 NOTE — Progress Notes (Signed)
INTERNAL MEDICINE TEACHING ATTENDING ADDENDUM - Lucious Groves, DO Duration- indefinate, Indication- VTE, INR-  Lab Results  Component Value Date   INR 3.10 12/11/2016   PROTIME 38.4 (H) 04/01/2012  . Agree with pharmacy recommendations as outlined in their note.

## 2016-12-11 NOTE — Progress Notes (Signed)
Anti-Coagulation Progress Note  [name] is a 56 y.o. male who is currently on an anti-coagulation regimen.    RECENT RESULTS: Recent results are below, the most recent result is correlated with a dose of 37.5 mg. per week: Lab Results  Component Value Date   INR 3.10 12/11/2016   INR 3.3 11/07/2016   INR 2.70 09/25/2016   PROTIME 38.4 (H) 04/01/2012    ANTI-COAG DOSE: Anticoagulation Dose Instructions as of 12/11/2016      [name] Tue Wed Thu Fri Sat   New Dose 5 mg 5 mg 2.5 mg 5 mg 5 mg 5 mg 5 mg    Description   Take 1 tablet by mouth once-daily--all days of week, EXCEPT on Tuesdays--take only 1/2 tablet on Tuesdays of each week. OMIT dose scheduled for Monday 12-FEB-18.       ANTICOAG SUMMARY: Anticoagulation Episode Summary    Current INR goal:   2.0-3.0  TTR:   62.8 % (5.9 y)  Next INR check:   12/25/2016  INR from last check:   3.10! (12/11/2016)  Weekly max dose:     Target end date:   Indefinite  [facility]   [facility]  Preferred lab:     Send INR reminders to:   ANTICOAG IMP   Indications   Embolism and thrombosis of artery ([place]) [I74.9] Long term current use of anticoagulant [Z79.01]       Comments:         Anticoagulation Care Providers    Provider Role Specialty Phone number   Michel Bickers, MD  Infectious Diseases [phone]      ANTICOAG TODAY: Anticoagulation Summary  As of 12/11/2016   INR goal:   2.0-3.0  TTR:     Today's INR:   3.10!  Next INR check:   12/25/2016  Target end date:   Indefinite   Indications   Embolism and thrombosis of artery ([place]) [I74.9] Long term current use of anticoagulant [Z79.01]        Anticoagulation Episode Summary    [facility]   Preferred lab:      Send INR reminders to:   ANTICOAG IMP   Comments:       Anticoagulation Care Providers    Provider Role Specialty Phone number   Michel Bickers, MD  Infectious Diseases [phone]      PATIENT  INSTRUCTIONS: Patient instructed to take medications as defined in the Anti-coagulation Track section of this encounter.  Patient instructed to OMT today's dose.  Take 1 tablet by mouth once-daily--all days of week, EXCEPT on Tuesdays--take only 1/2 tablet on Tuesdays of each week. OMIT dose scheduled for Monday 12-FEB-18.  Patient verbalized understanding of these instructions.     FOLLOW-UP Return in 2 weeks (on 12/25/2016) for Follow up INR at 1045h.  Jorene Guest, [name] Pharm.D., CACP

## 2016-12-12 ENCOUNTER — Ambulatory Visit (INDEPENDENT_AMBULATORY_CARE_PROVIDER_SITE_OTHER): Payer: Medicare Other | Admitting: Internal Medicine

## 2016-12-12 ENCOUNTER — Encounter: Payer: Self-pay | Admitting: Internal Medicine

## 2016-12-12 ENCOUNTER — Other Ambulatory Visit: Payer: Self-pay | Admitting: *Deleted

## 2016-12-12 DIAGNOSIS — R634 Abnormal weight loss: Secondary | ICD-10-CM | POA: Diagnosis not present

## 2016-12-12 DIAGNOSIS — G546 Phantom limb syndrome with pain: Secondary | ICD-10-CM

## 2016-12-12 DIAGNOSIS — F17200 Nicotine dependence, unspecified, uncomplicated: Secondary | ICD-10-CM

## 2016-12-12 DIAGNOSIS — Z23 Encounter for immunization: Secondary | ICD-10-CM | POA: Diagnosis not present

## 2016-12-12 DIAGNOSIS — B20 Human immunodeficiency virus [HIV] disease: Secondary | ICD-10-CM

## 2016-12-12 DIAGNOSIS — I749 Embolism and thrombosis of unspecified artery: Secondary | ICD-10-CM

## 2016-12-12 DIAGNOSIS — G894 Chronic pain syndrome: Secondary | ICD-10-CM

## 2016-12-12 LAB — CBC
HEMATOCRIT: 42.2 % (ref 38.5–50.0)
Hemoglobin: 13.8 g/dL (ref 13.2–17.1)
MCH: 30.4 pg (ref 27.0–33.0)
MCHC: 32.7 g/dL (ref 32.0–36.0)
MCV: 93 fL (ref 80.0–100.0)
MPV: 9 fL (ref 7.5–12.5)
Platelets: 375 10*3/uL (ref 140–400)
RBC: 4.54 MIL/uL (ref 4.20–5.80)
RDW: 15.8 % — AB (ref 11.0–15.0)
WBC: 7.8 10*3/uL (ref 3.8–10.8)

## 2016-12-12 LAB — COMPREHENSIVE METABOLIC PANEL
ALBUMIN: 4.1 g/dL (ref 3.6–5.1)
ALK PHOS: 600 U/L — AB (ref 40–115)
ALT: 9 U/L (ref 9–46)
AST: 16 U/L (ref 10–35)
BUN: 7 mg/dL (ref 7–25)
CALCIUM: 8.7 mg/dL (ref 8.6–10.3)
CO2: 15 mmol/L — AB (ref 20–31)
Chloride: 110 mmol/L (ref 98–110)
Creat: 1.78 mg/dL — ABNORMAL HIGH (ref 0.70–1.33)
GLUCOSE: 104 mg/dL — AB (ref 65–99)
POTASSIUM: 2.8 mmol/L — AB (ref 3.5–5.3)
Sodium: 134 mmol/L — ABNORMAL LOW (ref 135–146)
Total Bilirubin: 0.5 mg/dL (ref 0.2–1.2)
Total Protein: 7.7 g/dL (ref 6.1–8.1)

## 2016-12-12 MED ORDER — TENOFOVIR ALAFENAMIDE FUMARATE 25 MG PO TABS: 25.0000 mg | ORAL_TABLET | Freq: Every day | ORAL | 11 refills | Status: DC

## 2016-12-12 MED ORDER — OXYCODONE HCL 5 MG PO TABS: 5.0000 mg | ORAL_TABLET | Freq: Two times a day (BID) | ORAL | 0 refills | Status: DC | PRN

## 2016-12-12 MED ORDER — ENSURE PO LIQD: 1.0000 | Freq: Three times a day (TID) | ORAL | 99 refills | Status: DC

## 2016-12-12 MED ORDER — MORPHINE SULFATE ER 30 MG PO TBCR: 90.0000 mg | EXTENDED_RELEASE_TABLET | Freq: Three times a day (TID) | ORAL | 0 refills | Status: DC

## 2016-12-12 NOTE — Progress Notes (Signed)
HIV Genotype Composite Data Genotype Dates:   Mutations in Bold impact drug susceptibility RT Mutations K70R, M184V, T215F, Y181C, G190S  PI Mutations D30N  Integrase Mutations    Interpretation of Genotype Data per [employer] HIV Database Nucleoside RTIs  abacavir (ABC) Intermediate Resistance zidovudine (AZT) High-Level Resistance emtricitabine (FTC) High-Level Resistance lamivudine (3TC) High-Level Resistance tenofovir (TDF) Potential Low-Level Resistance   Non-Nucleoside RTIs  efavirenz (EFV) High-Level Resistance etravirine (ETR) Intermediate Resistance nevirapine (NVP) High-Level Resistance rilpivirine (RPV) High-Level Resistance   Protease Inhibitors  atazanavir/r (ATV/r) Susceptible darunavir/r (DRV/r) Susceptible lopinavir/r (LPV/r) Susceptible   Integrase Inhibitors

## 2016-12-12 NOTE — Progress Notes (Signed)
Patient Active Problem List   Diagnosis Date Noted  . Phantom limb syndrome with pain ([place]) 03/21/2012    Priority: High  . Long term current use of anticoagulant 11/20/2010    Priority: High  . Embolism and thrombosis of artery ([place]) 07/19/2009    Priority: High  . Human immunodeficiency virus (HIV) disease ([place]) 11/10/2006    Priority: High  . DYSLIPIDEMIA 11/10/2006    Priority: High  . CIGARETTE SMOKER 11/10/2006    Priority: High  . CKD (chronic kidney disease) 02/10/2014    Priority: Medium  . Hyperglycemia 12/29/2013    Priority: Medium  . Unintentional weight loss 11/04/2013    Priority: Medium  . Opioid dependence ([place]) 10/31/2016  . Long term current use of opiate analgesic 12/17/2015  . Debilitated 09/22/2015  . Renal tubular acidosis   . History of above knee amputation ([place])   . SBO (small bowel obstruction)   . Pressure ulcer 09/13/2015  . Protein-calorie malnutrition, severe 09/13/2015  . Polyuria 08/06/2015  . Prediabetes 08/06/2015  . Hematuria 08/06/2015  . UTI (urinary tract infection) 05/14/2015  . Avascular necrosis of femoral head ([place]) 05/14/2015  . Left adrenal mass ([place]) 05/14/2015  . Left hip pain 03/09/2013  . Preventative health care 03/09/2013  . ERECTILE DYSFUNCTION, ORGANIC 04/12/2010  . GERD 08/23/2009  . METHICILLIN RESISTANT STAPHYLOCOCCUS AUREUS INFECTION 08/05/2009  . ENLARGEMENT OF LYMPH NODES 07/20/2009  . HYPOGONADISM 07/15/2009  . DEFICIENCY OF OTHER VITAMINS 07/15/2009  . PULMONARY NODULE 07/15/2009  . Status post above knee amputation ([place]) 07/15/2009  . SHINGLES, RECURRENT 11/10/2006  . HEPATITIS B 11/10/2006  . PSYCHOSIS 11/10/2006  . Alcohol abuse 11/10/2006  . PERIPHERAL NEUROPATHY 11/10/2006  . ALLERGIC RHINITIS 11/10/2006  . DENTAL CARIES 11/10/2006    Patient's Medications  New Prescriptions   TENOFOVIR ALAFENAMIDE FUMARATE (VEMLIDY) 25 MG TABS    Take 1 tablet (25 mg total) by mouth daily.    Previous Medications   ACETAMINOPHEN (TYLENOL) 500 MG TABLET    Take 1 tablet (500 mg total) by mouth every 4 (four) hours as needed for mild pain, moderate pain or headache.   DARUNAVIR-COBICISTAT ([name]) 800-150 MG TABLET    Take 1 tablet by mouth daily. Swallow whole. Do NOT crush, break or chew tablets. Take with food.   DOLUTEGRAVIR ([name]) 50 MG TABLET    Take 1 tablet (50 mg total) by mouth daily.   GABAPENTIN (NEURONTIN) 400 MG CAPSULE    Take 1 capsule (400 mg total) by mouth 4 (four) times daily.   MAGNESIUM CHLORIDE (SLOW-MAG) 64 MG TBEC SR TABLET    Take 1 tablet (64 mg total) by mouth daily.   MULTIPLE VITAMIN (MULTIVITAMIN WITH MINERALS) TABS TABLET    Take 1 tablet by mouth daily.   PANTOPRAZOLE (PROTONIX) 40 MG TABLET    TAKE 1 TABLET BY MOUTH ONCE DAILY   PHOSPHORUS (K PHOS NEUTRAL) 155-852-130 MG TABLET    Take 3 tablets (750 mg total) by mouth 2 (two) times daily.   POTASSIUM CHLORIDE SA (K-DUR,KLOR-CON) 20 MEQ TABLET    TAKE 1 TABLET BY MOUTH DAILY   POTASSIUM CITRATE (UROCIT-K) 10 MEQ (1080 MG) SR TABLET    Take 4 tablets (40 mEq total) by mouth 2 (two) times daily with a meal.   SANTYL OINTMENT    APPLY TOPICALLY TO THE AFFECTED AREA(S) DAILY   SENNA-DOCUSATE (SENOKOT-S) 8.6-50 MG TABLET    Take 2 tablets by mouth 2 (  two) times daily.   SODIUM BICARBONATE 650 MG TABLET    Take 2 tablets (1,300 mg total) by mouth 3 (three) times daily.   WARFARIN (COUMADIN) 5 MG TABLET    Take 1 tablet (5 mg total) by mouth daily at 6 PM.  Modified Medications   Modified Medication Previous Medication   ENSURE (ENSURE) ENSURE (ENSURE)      Take 1 Can by mouth 4 (four) times daily - after meals and at bedtime.    Take 1 Can by mouth 3 (three) times daily between meals.   MORPHINE (MS CONTIN) 30 MG 12 HR TABLET morphine (MS CONTIN) 30 MG 12 hr tablet      Take 3 tablets (90 mg total) by mouth every 8 (eight) hours.    Take 3 tablets (90 mg total) by mouth every 8 (eight) hours.    OXYCODONE (OXY IR/ROXICODONE) 5 MG IMMEDIATE RELEASE TABLET oxyCODONE (OXY IR/ROXICODONE) 5 MG immediate release tablet      Take 1 tablet (5 mg total) by mouth 2 (two) times daily as needed for severe pain.    Take 1 tablet (5 mg total) by mouth 2 (two) times daily as needed for severe pain.  Discontinued Medications   [name] 300 MG TABLET    TAKE 1 TABLET BY MOUTH DAILY    Subjective: Meguel is in for his routine HIV follow-up visit.he is doing reasonably well. He has had no problems obtaining his [name], [name] and [name] except for 2 days during the recent snowstorm. Otherwise he has not missed any doses. His chronic pain is under reasonably good control. He recently switched from cigarettes to a vape pen and hopes to quit smoking completely. He has had some car problems which required him to stop formal physical therapy last fall. He hopes to resume once he has reliable transportation again.   Review of Systems: Review of Systems  Constitutional: Negative for chills, diaphoresis, fever, malaise/fatigue and weight loss.  HENT: Negative for sore throat.   Respiratory: Negative for cough, sputum production and shortness of breath.   Cardiovascular: Negative for chest pain.  Gastrointestinal: Negative for diarrhea, nausea and vomiting.  Genitourinary: Negative for dysuria and frequency.  Musculoskeletal: Positive for joint pain and myalgias.       He has had some aching pain in his left upper arm. He recently started using a different cloth sleeve on his right AKA stump and he's been having some more pain there. He has no open sores.  Skin: Negative for rash.  Neurological: Negative for dizziness and headaches.  Psychiatric/Behavioral: Negative for depression and substance abuse. The patient is not nervous/anxious.     Past Medical History:  Diagnosis Date  . Chronic kidney disease   . History of chicken pox   . History of DVT of lower extremity    right  . HIV (human  immunodeficiency virus infection) ([place])   . Hyperlipidemia   . Left hip pain 03/09/2013  . Pneumonia   . Protein malnutrition ([place]) 08/2015    Social History  Substance Use Topics  . Smoking status: Current Every Day Smoker    Packs/day: 0.50    Years: 36.00    Types: Cigarettes  . Smokeless tobacco: Never Used     Comment: ABOUT 1/2PPD.  Slowing down  . Alcohol use No    Family History  Problem Relation Age of Onset  . Arthritis Mother   . Kidney disease Maternal Grandfather     Allergies  Allergen Reactions  .  Dapsone     REACTION: fever  . Megestrol   . Sulfamethoxazole-Trimethoprim     REACTION: fever    Objective:  Vitals:   12/12/16 1044  Weight: 117 lb (53.1 kg)   Body mass index is 16.32 kg/m.  Physical Exam  Constitutional: He is oriented to person, place, and time.  He is in good spirits. He is seated in his electric wheelchair.  HENT:  Mouth/Throat: No oropharyngeal exudate.  Eyes: Conjunctivae are normal.  Cardiovascular: Normal rate and regular rhythm.   No murmur heard. Pulmonary/Chest: Effort normal and breath sounds normal.  Abdominal: Soft. He exhibits no mass. There is no tenderness.  Musculoskeletal:  His right leg prosthesis seems to fit normally.  Neurological: He is alert and oriented to person, place, and time.  Skin: No rash noted.  Psychiatric: Mood and affect normal.    Lab Results Lab Results  Component Value Date   WBC 7.2 05/16/2016   HGB 13.9 05/16/2016   HCT 42.5 05/16/2016   MCV 92.6 05/16/2016   PLT 337 05/16/2016    Lab Results  Component Value Date   CREATININE 1.63 (H) 05/16/2016   BUN 7 05/16/2016   NA 133 (L) 05/16/2016   K 3.4 (L) 05/16/2016   CL 106 05/16/2016   CO2 17 (L) 05/16/2016    Lab Results  Component Value Date   ALT 8 (L) 05/16/2016   AST 19 05/16/2016   ALKPHOS 465 (H) 05/16/2016   BILITOT 0.4 05/16/2016    Lab Results  Component Value Date   CHOL 211 (H) 05/16/2016   HDL 40  05/16/2016   LDLCALC 133 (H) 05/16/2016   TRIG 189 (H) 05/16/2016   CHOLHDL 5.3 (H) 05/16/2016   HIV 1 RNA Quant (copies/mL)  Date Value  05/16/2016 24 (H)  08/23/2015 <20  03/12/2015 <20   CD4 T Cell Abs (/uL)  Date Value  05/16/2016 940  08/23/2015 700  03/12/2015 1,210     Problem List Items Addressed This Visit      High   CIGARETTE SMOKER    I talked to him about a comprehensive cigarette cessation plan including setting a quit date.      Human immunodeficiency virus (HIV) disease ([place])    His adherence remains excellent. He had some elevation of his creatinine at the time of his last blood draw. I will change her [name] due to the new less nephrotoxic version called Vemlidy and continue it along with 2 okay and [name]. He will get repeat lab work today and follow-up in 6 months.      Relevant Medications   Tenofovir Alafenamide Fumarate (VEMLIDY) 25 MG TABS   Other Relevant Orders   T-helper cell (CD4)- ([facility] clinic only)   HIV 1 RNA quant-no reflex-bld   CBC   Comprehensive metabolic panel   RPR   Phantom limb syndrome with pain (HCC)    His chronic pain is under good control with his current pain regimen. I reviewed the [facility] narcotic database. He is only using one [employer] representative and I am the only prescriber. He is getting his medications filled monthly. I will continue his current regimen.       Other Visit Diagnoses    Weight loss       Relevant Medications   ENSURE (ENSURE)   Encounter for immunization       Relevant Orders   Flu Vaccine QUAD 36+ mos IM (Completed)        Michel Bickers,  [place] [facility] [place] [phone] pager   [phone] cell 12/12/2016, 1:16 PM

## 2016-12-12 NOTE — Assessment & Plan Note (Signed)
His chronic pain is under good control with his current pain regimen. I reviewed the [facility] narcotic database. He is only using one [employer] representative and I am the only prescriber. He is getting his medications filled monthly. I will continue his current regimen.

## 2016-12-12 NOTE — Assessment & Plan Note (Signed)
His adherence remains excellent. He had some elevation of his creatinine at the time of his last blood draw. I will change her [name] due to the new less nephrotoxic version called Vemlidy and continue it along with 2 okay and [name]. He will get repeat lab work today and follow-up in 6 months.

## 2016-12-12 NOTE — Assessment & Plan Note (Signed)
I talked to him about a comprehensive cigarette cessation plan including setting a quit date.

## 2016-12-13 LAB — T-HELPER CELL (CD4) - (RCID CLINIC ONLY)
CD4 % Helper T Cell: 26 % — ABNORMAL LOW (ref 33–55)
CD4 T Cell Abs: 920 /uL (ref 400–2700)

## 2016-12-13 LAB — RPR

## 2016-12-15 ENCOUNTER — Other Ambulatory Visit: Payer: Self-pay | Admitting: Internal Medicine

## 2016-12-15 DIAGNOSIS — G629 Polyneuropathy, unspecified: Secondary | ICD-10-CM

## 2016-12-17 LAB — HIV-1 RNA QUANT-NO REFLEX-BLD
HIV 1 RNA Quant: 44 copies/mL — ABNORMAL HIGH
HIV-1 RNA Quant, Log: 1.64 Log copies/mL — ABNORMAL HIGH

## 2016-12-25 ENCOUNTER — Ambulatory Visit (INDEPENDENT_AMBULATORY_CARE_PROVIDER_SITE_OTHER): Payer: Medicare Other | Admitting: Pharmacist

## 2016-12-25 ENCOUNTER — Encounter (INDEPENDENT_AMBULATORY_CARE_PROVIDER_SITE_OTHER): Payer: Self-pay

## 2016-12-25 DIAGNOSIS — Z7901 Long term (current) use of anticoagulants: Secondary | ICD-10-CM | POA: Diagnosis not present

## 2016-12-25 DIAGNOSIS — I749 Embolism and thrombosis of unspecified artery: Secondary | ICD-10-CM

## 2016-12-25 LAB — POCT INR: INR: 2.3

## 2016-12-25 MED ORDER — WARFARIN SODIUM 5 MG PO TABS: 5.0000 mg | ORAL_TABLET | Freq: Every day | ORAL | 0 refills | Status: DC

## 2016-12-25 NOTE — Progress Notes (Signed)
Anti-Coagulation Progress Note  [name] is a 56 y.o. male who is currently on an anti-coagulation regimen.    RECENT RESULTS: Recent results are below, the most recent result is correlated with a dose of 32.5 mg. per week: Lab Results  Component Value Date   INR 2.30 12/25/2016   INR 3.10 12/11/2016   INR 3.3 11/07/2016   PROTIME 38.4 (H) 04/01/2012    ANTI-COAG DOSE: Anticoagulation Dose Instructions as of 12/25/2016      [name] Tue Wed Thu Fri Sat   New Dose 5 mg 5 mg 2.5 mg 5 mg 5 mg 5 mg 5 mg    Description   Take 1 tablet by mouth once-daily.      ANTICOAG SUMMARY: Anticoagulation Episode Summary    Current INR goal:   2.0-3.0  TTR:   63.0 % (5.9 y)  Next INR check:   01/22/2017  INR from last check:   2.30 (12/25/2016)  Weekly max dose:     Target end date:   Indefinite  INR check location[facility]   [facility]  Preferred lab:     Send INR reminders to:   ANTICOAG IMP   Indications   Embolism and thrombosis of artery ([place]) [I74.9] Long term current use of anticoagulant [Z79.01]       Comments:         Anticoagulation Care Providers    Provider Role Specialty Phone number   Michel Bickers, MD  Infectious Diseases [phone]      ANTICOAG TODAY: Anticoagulation Summary  As of 12/25/2016   INR goal:   2.0-3.0  TTR:     Today's INR:   2.30  Next INR check:   01/22/2017  Target end date:   Indefinite   Indications   Embolism and thrombosis of artery ([place]) [I74.9] Long term current use of anticoagulant [Z79.01]        Anticoagulation Episode Summary    INR check location:   [facility]   Preferred lab:      Send INR reminders to:   ANTICOAG IMP   Comments:       Anticoagulation Care Providers    Provider Role Specialty Phone number   Michel Bickers, MD  Infectious Diseases [phone]      PATIENT INSTRUCTIONS: Patient instructed to take medications as defined in the Anti-coagulation Track section of this encounter.  Patient instructed  to take today's dose.  Patient instructed to take 1 tablet of your peach-colored 5mg  strength warfarin, by mouth, once-daily at [facility] each day.  Patient verbalized understanding of these instructions.       FOLLOW-UP Return in 4 weeks (on 01/22/2017) for Follow up INR at 1030h.  Jorene Guest, [name] Pharm.D., CACP

## 2016-12-25 NOTE — Patient Instructions (Signed)
Patient instructed to take medications as defined in the Anti-coagulation Track section of this encounter.  Patient instructed to take today's dose.  Patient instructed to take 1 tablet of your peach-colored 5mg  strength warfarin, by mouth, once-daily at [facility] each day.  Patient verbalized understanding of these instructions.

## 2016-12-30 NOTE — Progress Notes (Signed)
INTERNAL MEDICINE TEACHING ATTENDING ADDENDUM - Lucious Groves, DO Duration- indefinate, Indication- VTE, INR-  Lab Results  Component Value Date   INR 2.30 12/25/2016   PROTIME 38.4 (H) 04/01/2012  . Agree with pharmacy recommendations as outlined in their note.

## 2017-01-08 ENCOUNTER — Other Ambulatory Visit: Payer: Self-pay | Admitting: *Deleted

## 2017-01-08 DIAGNOSIS — G546 Phantom limb syndrome with pain: Secondary | ICD-10-CM

## 2017-01-08 DIAGNOSIS — G894 Chronic pain syndrome: Secondary | ICD-10-CM

## 2017-01-08 MED ORDER — MORPHINE SULFATE ER 30 MG PO TBCR: 90.0000 mg | EXTENDED_RELEASE_TABLET | Freq: Three times a day (TID) | ORAL | 0 refills | Status: DC

## 2017-01-08 MED ORDER — OXYCODONE HCL 5 MG PO TABS: 5.0000 mg | ORAL_TABLET | Freq: Two times a day (BID) | ORAL | 0 refills | Status: DC | PRN

## 2017-01-11 ENCOUNTER — Ambulatory Visit (INDEPENDENT_AMBULATORY_CARE_PROVIDER_SITE_OTHER): Payer: Medicare Other | Admitting: Internal Medicine

## 2017-01-11 ENCOUNTER — Encounter: Payer: Self-pay | Admitting: Internal Medicine

## 2017-01-11 VITALS — BP 122/74 | HR 110 | Temp 98.1°F | Ht 71.0 in | Wt 122.3 lb

## 2017-01-11 DIAGNOSIS — Z8719 Personal history of other diseases of the digestive system: Secondary | ICD-10-CM | POA: Diagnosis not present

## 2017-01-11 DIAGNOSIS — E43 Unspecified severe protein-calorie malnutrition: Secondary | ICD-10-CM

## 2017-01-11 DIAGNOSIS — R911 Solitary pulmonary nodule: Secondary | ICD-10-CM

## 2017-01-11 DIAGNOSIS — Z86718 Personal history of other venous thrombosis and embolism: Secondary | ICD-10-CM | POA: Diagnosis not present

## 2017-01-11 DIAGNOSIS — F17210 Nicotine dependence, cigarettes, uncomplicated: Secondary | ICD-10-CM

## 2017-01-11 DIAGNOSIS — H53009 Unspecified amblyopia, unspecified eye: Secondary | ICD-10-CM | POA: Diagnosis not present

## 2017-01-11 DIAGNOSIS — Z89611 Acquired absence of right leg above knee: Secondary | ICD-10-CM

## 2017-01-11 DIAGNOSIS — N189 Chronic kidney disease, unspecified: Secondary | ICD-10-CM | POA: Diagnosis not present

## 2017-01-11 DIAGNOSIS — Z21 Asymptomatic human immunodeficiency virus [HIV] infection status: Secondary | ICD-10-CM

## 2017-01-11 DIAGNOSIS — F17200 Nicotine dependence, unspecified, uncomplicated: Secondary | ICD-10-CM

## 2017-01-11 DIAGNOSIS — Z7901 Long term (current) use of anticoagulants: Secondary | ICD-10-CM

## 2017-01-11 NOTE — Patient Instructions (Signed)
[name],  It was a pleasure meeting you today Please follow-up in the acute care clinic for any acute medical needs. You will be contacted to get a CT scan of your chest to follow up on a lung nodule noted previously on imaging

## 2017-01-12 LAB — BMP8+ANION GAP
ANION GAP: 17 mmol/L (ref 10.0–18.0)
BUN/Creatinine Ratio: 6 — ABNORMAL LOW (ref 9–20)
BUN: 7 mg/dL (ref 6–24)
CALCIUM: 8.7 mg/dL (ref 8.7–10.2)
CHLORIDE: 103 mmol/L (ref 96–106)
CO2: 16 mmol/L — AB (ref 18–29)
Creatinine, Ser: 1.22 mg/dL (ref 0.76–1.27)
GFR calc Af Amer: 77 mL/min/{1.73_m2} (ref >59)
GFR calc non Af Amer: 66 mL/min/{1.73_m2} (ref >59)
GLUCOSE: 97 mg/dL (ref 65–99)
Potassium: 3 mmol/L — ABNORMAL LOW (ref 3.5–5.2)
SODIUM: 136 mmol/L (ref 134–144)

## 2017-01-14 DIAGNOSIS — R911 Solitary pulmonary nodule: Secondary | ICD-10-CM | POA: Insufficient documentation

## 2017-01-14 NOTE — Assessment & Plan Note (Signed)
Assessment:  tobacco abuse Patient continues to smoke cigarettes.  He smokes 1/2-1 pack a day.  He has an Building surveyor that he uses to help curb his cravings.  Patient states he is going to quit on his own.  Plan -counciled patient for 3 minutes on importance of smoking cessation

## 2017-01-14 NOTE — Assessment & Plan Note (Signed)
Assessment:  History of DVT Followed by Dr. Elie Confer.  Last seen 12/25/16 and INR was 2.3 and at goal   Plan Continue follow up at coumadin clinic

## 2017-01-14 NOTE — Assessment & Plan Note (Signed)
Assessment:  Pulmonary nodule CT in august 2010 patient was found to have multiple small ground glass nodules within the right and left lungs.  Recommended repeat Chest CT.   Plan -BMET to decide whether to order CT with or without contrast  Addendum Creatinine was 1.22 and Chest CT with contrast has been ordered.

## 2017-01-14 NOTE — Assessment & Plan Note (Signed)
Assessment:  Malnourished Patient states that he was able to obtain 6 cases of boost through his insurance.  He states he hasn't eaten today due to lack of appetite.  Patient had a drop in weight following a hospitalization for small bowel obstruction in 2016.  Patient has been increasing in weight since 2016 and is currently 122lbs     Plan -encouraged drinking boost - follow up in 6 months

## 2017-01-14 NOTE — Assessment & Plan Note (Signed)
Assessment:  Status post right AKA Prosthetic in place  Plan Continue to monitor

## 2017-01-14 NOTE — Progress Notes (Signed)
   CC: follow up for weightloss  HPI:  [name] is a 56 y.o. male with history noted below that presents to the [facility] for follow up for weightloss. Patient states that he experienced severe weightloss after having GI issues that resulted in hospitalization over a year ago.  He states that he recently got several cases of boost and is starting to drink them.     Patient states he follows with Dr. Megan Salon for his HIV and chronic pain management.    He follows with Dr. Elie Confer in the coumadin clinic due to history of DVT resulting in AKA of right lower extermity.  Patient states he continues to smoke 1/2-1 pack of cigarettes a day and is attempting to quit on his own by using electronic vape.  Past Medical History:  Diagnosis Date  . Chronic kidney disease   . History of chicken pox   . History of DVT of lower extremity    right  . HIV (human immunodeficiency virus infection) ([place])   . Hyperlipidemia   . Left hip pain 03/09/2013  . Pneumonia   . Protein malnutrition ([place]) 08/2015    Review of Systems:  Review of Systems  Respiratory: Negative for cough and shortness of breath.   Cardiovascular: Negative for chest pain.  Gastrointestinal: Negative for nausea and vomiting.  All other systems reviewed and are negative.    Physical Exam:  Vitals:   01/11/17 1341  BP: 122/74  Pulse: (!) 110  Temp: 98.1 F (36.7 C)  TempSrc: Oral  SpO2: 96%  Weight: 122 lb 4.8 oz (55.5 kg)  Height: 5\' 11"  (1.803 m)   Physical Exam  Constitutional:  Chronically ill appearing, fraile Wearing sunglass due to lazy eye  Cardiovascular: Normal rate, regular rhythm and normal heart sounds.  Exam reveals no gallop and no friction rub.   No murmur heard. Pulmonary/Chest: Effort normal and breath sounds normal. No respiratory distress. He has no wheezes. He has no rales.  Musculoskeletal: He exhibits no edema.  Right AKA, prosthetic   Skin: Skin is warm and dry.    Assessment & Plan:    See encounters tab for problem based medical decision making.   Patient discussed with Dr. Dareen Piano

## 2017-01-17 NOTE — Progress Notes (Signed)
Internal Medicine Clinic Attending  Case discussed with Dr. Hoffman at the time of the visit.  We reviewed the resident's history and exam and pertinent patient test results.  I agree with the assessment, diagnosis, and plan of care documented in the resident's note.  

## 2017-01-19 ENCOUNTER — Ambulatory Visit (HOSPITAL_COMMUNITY)
Admission: RE | Admit: 2017-01-19 | Discharge: 2017-01-19 | Disposition: A | Discharge status: Home / Self Care | Payer: Medicare Other | Source: Ambulatory Visit | Attending: Internal Medicine | Admitting: Internal Medicine

## 2017-01-19 ENCOUNTER — Other Ambulatory Visit: Payer: Self-pay | Admitting: Internal Medicine

## 2017-01-19 DIAGNOSIS — R911 Solitary pulmonary nodule: Secondary | ICD-10-CM

## 2017-01-19 MED ORDER — IOPAMIDOL (ISOVUE-300) INJECTION 61%
INTRAVENOUS | Status: AC
  Filled 2017-01-19: qty 75

## 2017-01-22 ENCOUNTER — Ambulatory Visit (INDEPENDENT_AMBULATORY_CARE_PROVIDER_SITE_OTHER): Payer: Medicare Other

## 2017-01-22 DIAGNOSIS — Z86718 Personal history of other venous thrombosis and embolism: Secondary | ICD-10-CM

## 2017-01-22 DIAGNOSIS — Z7901 Long term (current) use of anticoagulants: Secondary | ICD-10-CM | POA: Diagnosis not present

## 2017-01-22 LAB — POCT INR: INR: 2.1

## 2017-01-22 NOTE — Progress Notes (Signed)
Anti-Coagulation Progress Note  [name] is a 56 y.o. male who is currently on an anti-coagulation regimen.    RECENT RESULTS: Recent results are below, the most recent result is correlated with a dose of 40 mg. per week: Lab Results  Component Value Date   INR 2.1 01/22/2017   INR 2.30 12/25/2016   INR 3.10 12/11/2016   PROTIME 38.4 (H) 04/01/2012    ANTI-COAG DOSE:    ANTICOAG SUMMARY: Anticoagulation Episode Summary    Current INR goal:   2.0-3.0  TTR:   63.4 % (6 y)  Next INR check:   01/22/2017  INR from last check:   2.30 (12/25/2016)  Most recent INR:    2.1 (01/22/2017)  Weekly max dose:     Target end date:   Indefinite  INR check location:   [facility]  Preferred lab:     Send INR reminders to:   ANTICOAG IMP   Indications   History of DVT (deep vein thrombosis) [Z86.718] Long term current use of anticoagulant [Z79.01]       Comments:         Anticoagulation Care Providers    Provider Role Specialty Phone number   Michel Bickers, MD  Infectious Diseases [phone]      ANTICOAG TODAY:   PATIENT INSTRUCTIONS: Patient Instructions  Patient instructed to take medications as defined in the Anti-coagulation Track section of this encounter.  Patient instructed to take today's dose.  Patient instructed to take 1.5 tablets on Mondays and Thursdays, and take one tablet on all other days. Patient verbalized understanding of these instructions.       FOLLOW-UP Return in about 4 weeks (around 02/19/2017) for Follow-up INR in 4 weeks at 10:45 am.  Dierdre Harness, [place], PharmD Clinical Pharmacy Resident [phone] (Pager) 01/22/2017 2:30 PM

## 2017-01-22 NOTE — Patient Instructions (Signed)
Patient instructed to take medications as defined in the Anti-coagulation Track section of this encounter.  Patient instructed to take today's dose.  Patient instructed to take 1.5 tablets on Mondays and Thursdays, and take one tablet on all other days. Patient verbalized understanding of these instructions.

## 2017-01-22 NOTE — Progress Notes (Signed)
INTERNAL MEDICINE TEACHING ATTENDING ADDENDUM - Lucious Groves, DO Duration- indefinate, Indication- VTE, INR-  Lab Results  Component Value Date   INR 2.1 01/22/2017   PROTIME 38.4 (H) 04/01/2012  . Agree with pharmacy recommendations as outlined in their note.

## 2017-01-23 ENCOUNTER — Other Ambulatory Visit: Payer: Self-pay | Admitting: *Deleted

## 2017-01-23 DIAGNOSIS — R634 Abnormal weight loss: Secondary | ICD-10-CM

## 2017-01-23 MED ORDER — ENSURE PO LIQD: 1.0000 | Freq: Three times a day (TID) | ORAL | 99 refills | Status: DC

## 2017-02-02 ENCOUNTER — Other Ambulatory Visit: Payer: Self-pay | Admitting: Internal Medicine

## 2017-02-06 ENCOUNTER — Other Ambulatory Visit: Payer: Self-pay | Admitting: *Deleted

## 2017-02-06 DIAGNOSIS — G894 Chronic pain syndrome: Secondary | ICD-10-CM

## 2017-02-06 DIAGNOSIS — G546 Phantom limb syndrome with pain: Secondary | ICD-10-CM

## 2017-02-06 MED ORDER — OXYCODONE HCL 5 MG PO TABS: 5.0000 mg | ORAL_TABLET | Freq: Two times a day (BID) | ORAL | 0 refills | Status: DC | PRN

## 2017-02-06 MED ORDER — MORPHINE SULFATE ER 30 MG PO TBCR: 90.0000 mg | EXTENDED_RELEASE_TABLET | Freq: Three times a day (TID) | ORAL | 0 refills | Status: DC

## 2017-02-06 NOTE — Progress Notes (Signed)
Patient called his case manager at [facility], asking her to pass the message to clinic requesting pain medication refills to be picked up Thursday 4/12. Scripts printed for Dr Hale Bogus signature 4/10. [name], RN

## 2017-02-12 ENCOUNTER — Telehealth: Payer: Self-pay | Admitting: *Deleted

## 2017-02-12 NOTE — Telephone Encounter (Signed)
Urgent Prior authorization requested for patient's morphine sulfate er 30 mg #270.  [employer] requesting complete history of medications tried/failed. Should hear answer 24 hours, will be faxed to [phone].  Patient updated. [name], RN

## 2017-02-12 NOTE — Telephone Encounter (Signed)
Approved by [facility] through 10/29/2017. Patient and pharmacy notified.

## 2017-02-19 ENCOUNTER — Ambulatory Visit (INDEPENDENT_AMBULATORY_CARE_PROVIDER_SITE_OTHER): Payer: Medicare Other | Admitting: Pharmacist

## 2017-02-19 DIAGNOSIS — Z7901 Long term (current) use of anticoagulants: Secondary | ICD-10-CM

## 2017-02-19 DIAGNOSIS — Z86718 Personal history of other venous thrombosis and embolism: Secondary | ICD-10-CM | POA: Diagnosis not present

## 2017-02-19 LAB — POCT INR: INR: 2.4

## 2017-02-19 NOTE — Patient Instructions (Signed)
Patient instructed to take medications as defined in the Anti-coagulation Track section of this encounter.  Patient instructed to take today's dose.  Patient instructed to take 1 & 1/2 tablets of your peach-colored 5mg  strength warfarin tablets on Mondays and Thursdays; all other days--take ONLY 1 table Patient verbalized understanding of these instructions.

## 2017-02-19 NOTE — Progress Notes (Signed)
Anti-Coagulation Progress Note  [name] is a 56 y.o. male who is currently on an anti-coagulation regimen.    RECENT RESULTS: Recent results are below, the most recent result is correlated with a dose of 40 mg. per week: Lab Results  Component Value Date   INR 2.40 02/19/2017   INR 2.1 01/22/2017   INR 2.30 12/25/2016   PROTIME 38.4 (H) 04/01/2012    ANTI-COAG DOSE: Anticoagulation Dose Instructions as of 02/19/2017      [name] Tue Wed Thu Fri Sat   New Dose 5 mg 7.5 mg 5 mg 5 mg 7.5 mg 5 mg 5 mg    Description   Take 1 & 1/2 tablets of your peach-colored 5mg  strength warfarin tablets on Mondays and Thursdays; all other days--take ONLY 1 tablet.       ANTICOAG SUMMARY: Anticoagulation Episode Summary    Current INR goal:   2.0-3.0  TTR:   63.9 % (6.1 y)  Next INR check:   03/19/2017  INR from last check:   2.40 (02/19/2017)  Weekly max dose:     Target end date:   Indefinite  INR check location:   [facility]  Preferred lab:     Send INR reminders to:   ANTICOAG IMP   Indications   History of DVT (deep vein thrombosis) [Z86.718] Long term current use of anticoagulant [Z79.01]       Comments:         Anticoagulation Care Providers    Provider Role Specialty Phone number   Michel Bickers, MD  Infectious Diseases [phone]      ANTICOAG TODAY: Anticoagulation Summary  As of 02/19/2017   INR goal:   2.0-3.0  TTR:     Today's INR:   2.40  Next INR check:   03/19/2017  Target end date:   Indefinite   Indications   History of DVT (deep vein thrombosis) [Z86.718] Long term current use of anticoagulant [Z79.01]        Anticoagulation Episode Summary    INR check location:   [facility]   Preferred lab:      Send INR reminders to:   ANTICOAG IMP   Comments:       Anticoagulation Care Providers    Provider Role Specialty Phone number   Michel Bickers, MD  Infectious Diseases [phone]      PATIENT INSTRUCTIONS: Patient Instructions   Patient instructed to take medications as defined in the Anti-coagulation Track section of this encounter.  Patient instructed to take today's dose.  Patient instructed to take 1 & 1/2 tablets of your peach-colored 5mg  strength warfarin tablets on Mondays and Thursdays; all other days--take ONLY 1 table Patient verbalized understanding of these instructions.       FOLLOW-UP Return in 4 weeks (on 03/19/2017) for Follow up INR at 1100h.  Jorene Guest, [name] Pharm.D., CACP

## 2017-03-09 ENCOUNTER — Other Ambulatory Visit: Payer: Self-pay | Admitting: *Deleted

## 2017-03-09 DIAGNOSIS — G546 Phantom limb syndrome with pain: Secondary | ICD-10-CM

## 2017-03-09 DIAGNOSIS — G894 Chronic pain syndrome: Secondary | ICD-10-CM

## 2017-03-09 MED ORDER — OXYCODONE HCL 5 MG PO TABS: 5.0000 mg | ORAL_TABLET | Freq: Two times a day (BID) | ORAL | 0 refills | Status: DC | PRN

## 2017-03-09 MED ORDER — MORPHINE SULFATE ER 30 MG PO TBCR: 90.0000 mg | EXTENDED_RELEASE_TABLET | Freq: Three times a day (TID) | ORAL | 0 refills | Status: DC

## 2017-03-19 ENCOUNTER — Ambulatory Visit: Payer: Medicare Other

## 2017-03-29 ENCOUNTER — Other Ambulatory Visit: Payer: Self-pay | Admitting: *Deleted

## 2017-03-29 ENCOUNTER — Other Ambulatory Visit: Payer: Self-pay | Admitting: Internal Medicine

## 2017-03-29 MED ORDER — WARFARIN SODIUM 5 MG PO TABS: ORAL_TABLET | ORAL | 0 refills | Status: DC

## 2017-04-02 ENCOUNTER — Ambulatory Visit (INDEPENDENT_AMBULATORY_CARE_PROVIDER_SITE_OTHER): Payer: Medicare Other | Admitting: Pharmacist

## 2017-04-02 DIAGNOSIS — Z7901 Long term (current) use of anticoagulants: Secondary | ICD-10-CM | POA: Diagnosis present

## 2017-04-02 DIAGNOSIS — Z86718 Personal history of other venous thrombosis and embolism: Secondary | ICD-10-CM

## 2017-04-02 LAB — POCT INR: INR: 2

## 2017-04-02 NOTE — Progress Notes (Signed)
INTERNAL MEDICINE TEACHING ATTENDING ADDENDUM - Trine Fread M.D  Duration- indefinite, Indication- DVT, INR- therapeutic. Agree with pharmacy recommendations as outlined in their note.     

## 2017-04-02 NOTE — Patient Instructions (Signed)
Patient instructed to take medications as defined in the Anti-coagulation Track section of this encounter.  Patient instructed to take today's dose.  Patient instructed to take 1 & 1/2 tablets of your 5mg  peach-colored warfarin tablets on Mondays, Wednesdays and Fridays; ALL OTHER DAYS--take ONLY ONE (1) tablet by mouth at [facility].  Patient verbalized understanding of these instructions.

## 2017-04-02 NOTE — Progress Notes (Signed)
Anti-Coagulation Progress Note  [name] is a 56 y.o. male who is currently on an anti-coagulation regimen.    RECENT RESULTS: Recent results are below, the most recent result is correlated with a dose of 40 mg. per week for history of DVT [Z86.718] and long term current use of anticoagulant [79.01] for history of multiple recurrences of VTE. Patient has been being seen in this clinic since 06/22/2009 when he was referred by Dr. Michel Bickers. He is on indefinite oral anticoagulation management with his history of multiple recurrences of VTE. An annual reassessment of continued benefit vs risk on warfarin for prevention of recurrence of VTE is warranted and suggested by the guidelines published in the journal CHEST.  Lab Results  Component Value Date   INR 2.00 04/02/2017   INR 2.40 02/19/2017   INR 2.1 01/22/2017   PROTIME 38.4 (H) 04/01/2012    ANTI-COAG DOSE: Anticoagulation Warfarin Dose Instructions as of 04/02/2017      [name] Tue Wed Thu Fri Sat   New Dose 5 mg 7.5 mg 5 mg 7.5 mg 5 mg 7.5 mg 5 mg    Description   Take 1 & 1/2 tablets of your peach-colored 5mg  strength warfarin tablets on Mondays , Wednesdays and Fridays; all other days--take ONLY 1 tablet.       ANTICOAG SUMMARY: Anticoagulation Episode Summary    Current INR goal:   2.0-3.0  TTR:   64.6 % (6.2 y)  Next INR check:   04/30/2017  INR from last check:   2.00 (04/02/2017)  Weekly max warfarin dose:     Target end date:   Indefinite  INR check location:   [facility]  Preferred lab:     Send INR reminders to:   ANTICOAG IMP   Indications   History of DVT (deep vein thrombosis) [Z86.718] Long term current use of anticoagulant [Z79.01]       Comments:         Anticoagulation Care Providers    Provider Role Specialty Phone number   Michel Bickers, MD  Infectious Diseases [phone]      ANTICOAG TODAY: Anticoagulation Summary  As of 04/02/2017   INR goal:   2.0-3.0  TTR:     Today's INR:   2.00   Next INR check:   04/30/2017  Target end date:   Indefinite   Indications   History of DVT (deep vein thrombosis) [Z86.718] Long term current use of anticoagulant [Z79.01]        Anticoagulation Episode Summary    INR check location:   [facility]   Preferred lab:      Send INR reminders to:   ANTICOAG IMP   Comments:       Anticoagulation Care Providers    Provider Role Specialty Phone number   Michel Bickers, MD  Infectious Diseases [phone]      PATIENT INSTRUCTIONS: Patient Instructions  Patient instructed to take medications as defined in the Anti-coagulation Track section of this encounter.  Patient instructed to take today's dose.  Patient instructed to take 1 & 1/2 tablets of your 5mg  peach-colored warfarin tablets on Mondays, Wednesdays and Fridays; ALL OTHER DAYS--take ONLY ONE (1) tablet by mouth at [facility].  Patient verbalized understanding of these instructions.       FOLLOW-UP Return in 4 weeks (on 04/30/2017) for Follow up INR at 1000h.  Jorene Guest, [name] Pharm.D., CACP

## 2017-04-10 ENCOUNTER — Other Ambulatory Visit: Payer: Self-pay | Admitting: *Deleted

## 2017-04-10 ENCOUNTER — Other Ambulatory Visit: Payer: Self-pay | Admitting: Internal Medicine

## 2017-04-10 ENCOUNTER — Telehealth: Payer: Self-pay | Admitting: *Deleted

## 2017-04-10 DIAGNOSIS — G546 Phantom limb syndrome with pain: Secondary | ICD-10-CM

## 2017-04-10 DIAGNOSIS — G894 Chronic pain syndrome: Secondary | ICD-10-CM

## 2017-04-10 DIAGNOSIS — F1120 Opioid dependence, uncomplicated: Secondary | ICD-10-CM

## 2017-04-10 MED ORDER — MORPHINE SULFATE ER 30 MG PO TBCR: 90.0000 mg | EXTENDED_RELEASE_TABLET | Freq: Three times a day (TID) | ORAL | 0 refills | Status: DC

## 2017-04-10 MED ORDER — OXYCODONE HCL 5 MG PO TABS: 5.0000 mg | ORAL_TABLET | Freq: Two times a day (BID) | ORAL | 0 refills | Status: DC | PRN

## 2017-04-10 NOTE — Telephone Encounter (Signed)
Patient called for a refill of his pain medication. Given to Dr. Megan Salon for signature and he will pick it up tomorrow. Myrtis Hopping

## 2017-04-10 NOTE — Progress Notes (Signed)
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 Mike has only received prescriptions from our practice and has consistently used the same [employer].

## 2017-04-11 ENCOUNTER — Telehealth: Payer: Self-pay | Admitting: *Deleted

## 2017-04-11 ENCOUNTER — Other Ambulatory Visit: Payer: Medicare Other

## 2017-04-11 DIAGNOSIS — F1120 Opioid dependence, uncomplicated: Secondary | ICD-10-CM

## 2017-04-11 NOTE — Telephone Encounter (Signed)
Patient came into clinic to get his pain medication Rx and provide a urine drug screen. When he was leaving he asked to be weighed. His weight was 127 lb and he stated that had not eaten in two days. Asked if he needed food and he said no that he had a lot of canned food at home, but that his daughter buys him food that he can not eat because it is too hard to chew without teeth. He said he has dentures at home, but they do not fit properly. He previously had a root that was stuck in his gum for several years before a dentist removed it. I introduced him to one of the dental technicians and she explained to him that the dentures that they would make him would fit his mouth and he would be able to eat better. He was worried because his previous dentures did not fit properly and were too big for his mouth. Dental form filled out and placed in box for pick up.

## 2017-04-13 LAB — PAIN MGMT, PROFILE 1 W/O CONF, U
Amphetamines: NEGATIVE ng/mL (ref <500)
Barbiturates: NEGATIVE ng/mL (ref <300)
Benzodiazepines: NEGATIVE ng/mL (ref <100)
Cocaine Metabolite: NEGATIVE ng/mL (ref <150)
Creatinine: 76.6 mg/dL (ref >=20.0)
METHADONE METABOLITE: NEGATIVE ng/mL (ref <100)
Marijuana Metabolite: POSITIVE ng/mL — AB (ref <20)
OPIATES: POSITIVE ng/mL — AB (ref <100)
Oxidant: NEGATIVE ug/mL (ref <200)
Oxycodone: POSITIVE ng/mL — AB (ref <100)
Phencyclidine: NEGATIVE ng/mL (ref <25)
pH: 6.05 (ref 4.5–9.0)

## 2017-04-30 ENCOUNTER — Ambulatory Visit (INDEPENDENT_AMBULATORY_CARE_PROVIDER_SITE_OTHER): Payer: Medicare Other | Admitting: Pharmacist

## 2017-04-30 DIAGNOSIS — Z86718 Personal history of other venous thrombosis and embolism: Secondary | ICD-10-CM

## 2017-04-30 DIAGNOSIS — Z7901 Long term (current) use of anticoagulants: Secondary | ICD-10-CM | POA: Diagnosis not present

## 2017-04-30 DIAGNOSIS — I749 Embolism and thrombosis of unspecified artery: Secondary | ICD-10-CM | POA: Diagnosis not present

## 2017-04-30 LAB — POCT INR: INR: 2.2

## 2017-04-30 NOTE — Progress Notes (Signed)
INTERNAL MEDICINE TEACHING ATTENDING ADDENDUM - Matthew Groves, DO Duration- indefinite (patient choice) , Indication- VTE (severe resulted in amputation), INR-  Lab Results  Component Value Date   INR 2.20 04/30/2017   PROTIME 38.4 (H) 04/01/2012  . Agree with pharmacy recommendations as outlined in their note.

## 2017-04-30 NOTE — Patient Instructions (Signed)
Patient instructed to take medications as defined in the Anti-coagulation Track section of this encounter.  Patient instructed to take today's dose.  Patient instructed to take 1 & 1/2 tablets of your 5mg  peach-colored warfarin tablets by mouth, once-daily at [facility] of each week. On Saturdays and Sundays, take ONLY one (1) tablet of your 5mg  peach-colored warfarin tablets.  Patient verbalized understanding of these instructions.

## 2017-04-30 NOTE — Progress Notes (Signed)
Anticoagulation Management [name] is a 56 y.o. male who reports to the clinic for monitoring of warfarin treatment.    Indication: DVT , history of and long term use of anticoagulation.  Duration: indefinite Supervising physician: Joni Reining  Anticoagulation Clinic Visit History: Patient does not report signs/symptoms of bleeding or thromboembolism  Other recent changes: No diet, medications, lifestyle changes endorsed by patient.  Anticoagulation Episode Summary    Current INR goal:   2.0-3.0  TTR:   65.0 % (6.3 y)  Next INR check:   05/28/2017  INR from last check:   2.20 (04/30/2017)  Weekly max warfarin dose:     Target end date:   Indefinite  INR check location[facility]   [facility]  Preferred lab:     Send INR reminders to:   ANTICOAG IMP   Indications   History of DVT (deep vein thrombosis) [Z86.718] Long term current use of anticoagulant [Z79.01]       Comments:         Anticoagulation Care Providers    Provider Role Specialty Phone number   Michel Bickers, MD  Infectious Diseases [phone]     ASSESSMENT Recent Results: The most recent result is correlated with 42.5 mg per week: Lab Results  Component Value Date   INR 2.20 04/30/2017   INR 2.00 04/02/2017   INR 2.40 02/19/2017   PROTIME 38.4 (H) 04/01/2012    Anticoagulation Dosing: INR as of 04/30/2017 and Previous Warfarin Dosing Information    INR Dt INR Goal [employer] Sun Mon Tue Wed Thu Fri Sat   04/30/2017 2.20 2.0-3.0 42.5 mg 5 mg 7.5 mg 5 mg 7.5 mg 5 mg 7.5 mg 5 mg    Previous description   Take 1 & 1/2 tablets of your peach-colored 5mg  strength warfarin tablets on Mondays , Wednesdays and Fridays; all other days--take ONLY 1 tablet.    Anticoagulation Warfarin Dose Instructions as of 04/30/2017      Total Sun Mon Tue Wed Thu Fri Sat   New Dose 47.5 mg 5 mg 7.5 mg 7.5 mg 7.5 mg 7.5 mg 7.5 mg 5 mg     (5 mg x 1)  (5 mg x 1.5)  (5 mg x 1.5)  (5 mg x 1.5)  (5 mg x 1.5)  (5 mg x 1.5)  (5 mg x  1)                         Description   Take 1 & 1/2 tablets of your peach-colored 5mg  strength warfarin tablets Mondays through Fridays; on Saturdays and Sundays, take ONLY 1 tablet.      INR today: Therapeutic  PLAN Weekly dose was increased by 12% to 47.5 mg per week  Patient Instructions  Patient instructed to take medications as defined in the Anti-coagulation Track section of this encounter.  Patient instructed to take today's dose.  Patient instructed to take 1 & 1/2 tablets of your 5mg  peach-colored warfarin tablets by mouth, once-daily at [facility] of each week. On Saturdays and Sundays, take ONLY one (1) tablet of your 5mg  peach-colored warfarin tablets.  Patient verbalized understanding of these instructions.     Patient advised to contact clinic or seek medical attention if signs/symptoms of bleeding or thromboembolism occur.  Patient verbalized understanding by repeating back information and was advised to contact me if further medication-related questions arise. Patient was also provided an information handout.  Follow-up Return in 4 weeks (on  05/28/2017) for Follow up INR at 0945h.  Caryl Bis PharmD, CACP, CPP  15 minutes spent face-to-face with the patient during the encounter. 50% of time spent on education. 50% of time was spent on finger-stick, point of care INR sample collection, processing, interpretation, data-entry in to [employer] [email or web address]

## 2017-05-08 ENCOUNTER — Other Ambulatory Visit: Payer: Self-pay | Admitting: *Deleted

## 2017-05-08 ENCOUNTER — Other Ambulatory Visit: Payer: Self-pay | Admitting: Internal Medicine

## 2017-05-08 DIAGNOSIS — G894 Chronic pain syndrome: Secondary | ICD-10-CM

## 2017-05-08 DIAGNOSIS — G546 Phantom limb syndrome with pain: Secondary | ICD-10-CM

## 2017-05-08 DIAGNOSIS — R634 Abnormal weight loss: Secondary | ICD-10-CM

## 2017-05-08 MED ORDER — ENSURE PO LIQD: 1.0000 | Freq: Three times a day (TID) | ORAL | 11 refills | Status: DC

## 2017-05-08 MED ORDER — MORPHINE SULFATE ER 30 MG PO TBCR: 90.0000 mg | EXTENDED_RELEASE_TABLET | Freq: Three times a day (TID) | ORAL | 0 refills | Status: DC

## 2017-05-08 MED ORDER — OXYCODONE HCL 5 MG PO TABS: 5.0000 mg | ORAL_TABLET | Freq: Two times a day (BID) | ORAL | 0 refills | Status: DC | PRN

## 2017-05-09 ENCOUNTER — Telehealth: Payer: Self-pay | Admitting: *Deleted

## 2017-05-09 NOTE — Telephone Encounter (Signed)
Per conversation with Dr Megan Salon, he want to collect urine for UDS per signed pain contract at patient's next appointment, 8/7. This will be brought up at the appointment by Dr Megan Salon. [name], RN

## 2017-05-10 ENCOUNTER — Other Ambulatory Visit: Payer: Self-pay | Admitting: Internal Medicine

## 2017-05-10 MED ORDER — POTASSIUM CHLORIDE CRYS ER 20 MEQ PO TBCR: 20.0000 meq | EXTENDED_RELEASE_TABLET | Freq: Every day | ORAL | 0 refills | Status: DC

## 2017-05-10 MED ORDER — WARFARIN SODIUM 5 MG PO TABS: ORAL_TABLET | ORAL | 0 refills | Status: DC

## 2017-05-17 ENCOUNTER — Encounter: Payer: Medicare Other | Admitting: Internal Medicine

## 2017-05-22 ENCOUNTER — Other Ambulatory Visit: Payer: Medicare Other

## 2017-05-22 DIAGNOSIS — B20 Human immunodeficiency virus [HIV] disease: Secondary | ICD-10-CM | POA: Diagnosis not present

## 2017-05-22 DIAGNOSIS — G546 Phantom limb syndrome with pain: Secondary | ICD-10-CM

## 2017-05-22 LAB — COMPREHENSIVE METABOLIC PANEL
ALT: 13 U/L (ref 9–46)
AST: 19 U/L (ref 10–35)
Albumin: 4 g/dL (ref 3.6–5.1)
Alkaline Phosphatase: 408 U/L — ABNORMAL HIGH (ref 40–115)
BILIRUBIN TOTAL: 0.6 mg/dL (ref 0.2–1.2)
BUN: 10 mg/dL (ref 7–25)
CALCIUM: 9.3 mg/dL (ref 8.6–10.3)
CO2: 27 mmol/L (ref 20–31)
CREATININE: 1.31 mg/dL (ref 0.70–1.33)
Chloride: 102 mmol/L (ref 98–110)
GLUCOSE: 160 mg/dL — AB (ref 65–99)
Potassium: 4.1 mmol/L (ref 3.5–5.3)
SODIUM: 137 mmol/L (ref 135–146)
Total Protein: 7.7 g/dL (ref 6.1–8.1)

## 2017-05-22 LAB — CBC
HCT: 46.5 % (ref 38.5–50.0)
Hemoglobin: 15.3 g/dL (ref 13.2–17.1)
MCH: 30.4 pg (ref 27.0–33.0)
MCHC: 32.9 g/dL (ref 32.0–36.0)
MCV: 92.3 fL (ref 80.0–100.0)
MPV: 9.7 fL (ref 7.5–12.5)
PLATELETS: 306 10*3/uL (ref 140–400)
RBC: 5.04 MIL/uL (ref 4.20–5.80)
RDW: 14.7 % (ref 11.0–15.0)
WBC: 6.6 10*3/uL (ref 3.8–10.8)

## 2017-05-23 LAB — RPR

## 2017-05-23 LAB — PAIN MGMT, PROFILE 1 W/O CONF, U
Amphetamines: NEGATIVE ng/mL (ref <500)
Barbiturates: NEGATIVE ng/mL (ref <300)
Benzodiazepines: NEGATIVE ng/mL (ref <100)
Cocaine Metabolite: NEGATIVE ng/mL (ref <150)
Creatinine: 76.6 mg/dL (ref >=20.0)
Marijuana Metabolite: POSITIVE ng/mL — AB (ref <20)
Methadone Metabolite: NEGATIVE ng/mL (ref <100)
OPIATES: POSITIVE ng/mL — AB (ref <100)
OXIDANT: NEGATIVE ug/mL (ref <200)
OXYCODONE: POSITIVE ng/mL — AB (ref <100)
PH: 6.24 (ref 4.5–9.0)
PHENCYCLIDINE: NEGATIVE ng/mL (ref <25)

## 2017-05-23 LAB — T-HELPER CELL (CD4) - (RCID CLINIC ONLY)
CD4 % Helper T Cell: 24 % — ABNORMAL LOW (ref 33–55)
CD4 T Cell Abs: 740 /uL (ref 400–2700)

## 2017-05-25 LAB — HIV-1 RNA QUANT-NO REFLEX-BLD
HIV 1 RNA QUANT: NOT DETECTED {copies}/mL
HIV-1 RNA QUANT, LOG: NOT DETECTED {Log_copies}/mL

## 2017-05-28 ENCOUNTER — Ambulatory Visit: Payer: Medicare Other

## 2017-05-29 ENCOUNTER — Encounter: Payer: Self-pay | Admitting: Internal Medicine

## 2017-05-31 ENCOUNTER — Other Ambulatory Visit: Payer: Self-pay | Admitting: Internal Medicine

## 2017-05-31 DIAGNOSIS — B20 Human immunodeficiency virus [HIV] disease: Secondary | ICD-10-CM

## 2017-06-04 ENCOUNTER — Ambulatory Visit (INDEPENDENT_AMBULATORY_CARE_PROVIDER_SITE_OTHER): Payer: Medicare Other | Admitting: Pharmacist

## 2017-06-04 DIAGNOSIS — Z86718 Personal history of other venous thrombosis and embolism: Secondary | ICD-10-CM

## 2017-06-04 DIAGNOSIS — Z7901 Long term (current) use of anticoagulants: Secondary | ICD-10-CM

## 2017-06-04 DIAGNOSIS — I749 Embolism and thrombosis of unspecified artery: Secondary | ICD-10-CM

## 2017-06-04 LAB — POCT INR: INR: 2.2

## 2017-06-04 NOTE — Patient Instructions (Signed)
Patient instructed to take medications as defined in the Anti-coagulation Track section of this encounter.  Patient instructed to take today's dose.  Patient instructed to take 1 & 1/2 tablets of your peach-colored 5mg strength warfarin tablets Mondays through Fridays; on Saturdays and Sundays, take ONLY 1 tablet.  Patient verbalized understanding of these instructions.     

## 2017-06-04 NOTE — Progress Notes (Signed)
Anticoagulation Management [name] is a 56 y.o. male who reports to the clinic for monitoring of warfarin treatment.    Indication: DVT, history of; long term use of anticoagulants due to multiple embolic events.   Duration: indefinite Supervising physician: Aldine Contes  Anticoagulation Clinic Visit History: Patient does not report signs/symptoms of bleeding or thromboembolism  Other recent changes: No diet, medications, lifestyle changes endorsed by the patient.  Anticoagulation Episode Summary    Current INR goal:   2.0-3.0  TTR:   65.5 % (6.3 y)  Next INR check:   07/09/2017  INR from last check:   2.20 (06/04/2017)  Weekly max warfarin dose:     Target end date:   Indefinite  [facility]   [facility]  Preferred lab:     Send INR reminders to:   ANTICOAG IMP   Indications   History of DVT (deep vein thrombosis) [Z86.718] Long term current use of anticoagulant [Z79.01]       Comments:         Anticoagulation Care Providers    Provider Role Specialty Phone number   Michel Bickers, MD  Infectious Diseases [phone]      Allergies  Allergen Reactions  . Dapsone     REACTION: fever  . Megestrol   . Sulfamethoxazole-Trimethoprim     REACTION: fever   Prior to Admission medications   Medication Sig Start Date End Date Taking? Authorizing Provider  acetaminophen (TYLENOL) 500 MG tablet Take 1 tablet (500 mg total) by mouth every 4 (four) hours as needed for mild pain, moderate pain or headache. 09/22/15  Yes Shela Leff, MD  ENSURE (ENSURE) Take 1 Can by mouth 4 (four) times daily - after meals and at bedtime. 05/08/17  Yes Michel Bickers, MD  gabapentin (NEURONTIN) 400 MG capsule TAKE 1 CAPSULE(400 MG) BY MOUTH FOUR TIMES DAILY 12/15/16  Yes Michel Bickers, MD  magnesium chloride (SLOW-MAG) 64 MG TBEC SR tablet Take 1 tablet (64 mg total) by mouth daily. 10/08/15  Yes Jule Ser, DO  morphine (MS CONTIN) 30 MG 12 hr tablet Take 3 tablets  (90 mg total) by mouth every 8 (eight) hours. 05/08/17  Yes Michel Bickers, MD  Multiple Vitamin (MULTIVITAMIN WITH MINERALS) TABS tablet Take 1 tablet by mouth daily. 10/08/15  Yes Jule Ser, DO  oxyCODONE (OXY IR/ROXICODONE) 5 MG immediate release tablet Take 1 tablet (5 mg total) by mouth 2 (two) times daily as needed for severe pain. 05/08/17  Yes Michel Bickers, MD  pantoprazole (PROTONIX) 40 MG tablet TAKE 1 TABLET BY MOUTH ONCE DAILY 09/13/16  Yes Valinda Party, DO  phosphorus (K PHOS NEUTRAL) 155-852-130 MG tablet Take 3 tablets (750 mg total) by mouth 2 (two) times daily. 10/08/15  Yes Jule Ser, DO  potassium chloride SA (K-DUR,KLOR-CON) 20 MEQ tablet Take 1 tablet (20 mEq total) by mouth daily. 05/10/17  Yes Hoffman, Jessica Ratliff, DO  potassium citrate (UROCIT-K) 10 MEQ (1080 MG) SR tablet Take 4 tablets (40 mEq total) by mouth 2 (two) times daily with a meal. 09/22/15  Yes Shela Leff, MD  [name] 800-150 MG tablet TAKE 1 TABLET BY MOUTH DAILY. SWALLOW WHOLE. DO NOT CRUSH, BREAK OR CHEW TABLETS. TAKE WITH FOOD. 05/31/17  Yes Michel Bickers, MD  SANTYL ointment APPLY TOPICALLY TO THE AFFECTED AREA(S) DAILY 11/05/15  Yes Iline Oven, MD  sodium bicarbonate 650 MG tablet Take 2 tablets (1,300 mg total) by mouth 3 (three) times daily. 10/08/15  Yes Jule Ser,  DO  Tenofovir Alafenamide Fumarate (VEMLIDY) 25 MG TABS Take 1 tablet (25 mg total) by mouth daily. 12/12/16  Yes Michel Bickers, MD  [name] 50 MG tablet TAKE 1 TABLET BY MOUTH DAILY 05/31/17  Yes Michel Bickers, MD  warfarin (COUMADIN) 5 MG tablet Take 1 & 1/2 tablets Mondays through Fridays; on Saturdays and Sundays, take ONLY 1 tablet. 05/10/17  Yes Valinda Party, DO   Past Medical History:  Diagnosis Date  . Chronic kidney disease   . History of chicken pox   . History of DVT of lower extremity    right  . HIV (human immunodeficiency virus infection) ([place])   . Hyperlipidemia   .  Left hip pain 03/09/2013  . Pneumonia   . Protein malnutrition ([place]) 08/2015   Social History   Social History  . Marital status: Widowed    Spouse name: N/A  . Number of children: 3  . Years of education: N/A   Occupational History  .  Unemployed   Social History Main Topics  . Smoking status: Current Every Day Smoker    Packs/day: 0.50    Years: 36.00    Types: Cigarettes  . Smokeless tobacco: Never Used     Comment: ABOUT 1/2PPD.  Slowing down  . Alcohol use No  . Drug use: No  . Sexual activity: Not Currently     Comment: declined condoms   Other Topics Concern  . Not on file   Social History Narrative  . No narrative on file   Family History  Problem Relation Age of Onset  . Arthritis Mother   . Kidney disease Maternal Grandfather     ASSESSMENT Recent Results: The most recent result is correlated with 47.5 mg per week: Lab Results  Component Value Date   INR 2.20 06/04/2017   INR 2.20 04/30/2017   INR 2.00 04/02/2017   PROTIME 38.4 (H) 04/01/2012    Anticoagulation Dosing: INR as of 06/04/2017 and Previous Warfarin Dosing Information    INR Dt INR Goal [employer] Sun Mon Tue Wed Thu Fri Sat   06/04/2017 2.20 2.0-3.0 47.5 mg 5 mg 7.5 mg 7.5 mg 7.5 mg 7.5 mg 7.5 mg 5 mg    Previous description   Take 1 & 1/2 tablets of your peach-colored 5mg  strength warfarin tablets Mondays through Fridays; on Saturdays and Sundays, take ONLY 1 tablet.    Anticoagulation Warfarin Dose Instructions as of 06/04/2017      Total Sun Mon Tue Wed Thu Fri Sat   New Dose 47.5 mg 5 mg 7.5 mg 7.5 mg 7.5 mg 7.5 mg 7.5 mg 5 mg     (5 mg x 1)  (5 mg x 1.5)  (5 mg x 1.5)  (5 mg x 1.5)  (5 mg x 1.5)  (5 mg x 1.5)  (5 mg x 1)                         Description   Take 1 & 1/2 tablets of your peach-colored 5mg  strength warfarin tablets Mondays through Fridays; on Saturdays and Sundays, take ONLY 1 tablet.      INR today: Therapeutic  PLAN Weekly dose was unchanged.  Patient  Instructions  Patient instructed to take medications as defined in the Anti-coagulation Track section of this encounter.  Patient instructed to take today's dose.  Patient instructed to take 1 & 1/2 tablets of your peach-colored 5mg  strength warfarin tablets Mondays through Fridays; on  Saturdays and Sundays, take ONLY 1 tablet Patient verbalized understanding of these instructions.     Patient advised to contact clinic or seek medical attention if signs/symptoms of bleeding or thromboembolism occur.  Patient verbalized understanding by repeating back information and was advised to contact me if further medication-related questions arise. Patient was also provided an information handout.  Follow-up Return in 5 weeks (on 07/09/2017) for Follow up INR at 0945h.  Caryl Bis PharmD, CACP, CPP  15 minutes spent face-to-face with the patient during the encounter. 50% of time spent on education. 50% of time was spent on fingerstick point of care INR sample collection, processing, results interpretation adjustment of dose if necessary, data entry in to [email or web address] .

## 2017-06-04 NOTE — Progress Notes (Signed)
INTERNAL MEDICINE TEACHING ATTENDING ADDENDUM - Arian Mcquitty M.D  Duration- indefinite, Indication- DVT, INR- therapeutic. Agree with pharmacy recommendations as outlined in their note.     

## 2017-06-05 ENCOUNTER — Ambulatory Visit (INDEPENDENT_AMBULATORY_CARE_PROVIDER_SITE_OTHER): Payer: Medicare Other | Admitting: Internal Medicine

## 2017-06-05 ENCOUNTER — Encounter: Payer: Self-pay | Admitting: Internal Medicine

## 2017-06-05 DIAGNOSIS — I749 Embolism and thrombosis of unspecified artery: Secondary | ICD-10-CM

## 2017-06-05 DIAGNOSIS — F17200 Nicotine dependence, unspecified, uncomplicated: Secondary | ICD-10-CM | POA: Diagnosis not present

## 2017-06-05 DIAGNOSIS — B20 Human immunodeficiency virus [HIV] disease: Secondary | ICD-10-CM | POA: Diagnosis present

## 2017-06-05 DIAGNOSIS — G546 Phantom limb syndrome with pain: Secondary | ICD-10-CM

## 2017-06-05 NOTE — Assessment & Plan Note (Signed)
His pain is under good control. He will continue his current regimen. I have reviewed the [place] and there are no concerns about his prescriptions.

## 2017-06-05 NOTE — Assessment & Plan Note (Signed)
His infection is under excellent, long-term control.  He will continue his current 3 drug regimen and follow-up after lab work in 6 months. 

## 2017-06-05 NOTE — Progress Notes (Signed)
Patient Active Problem List   Diagnosis Date Noted  . Phantom limb syndrome with pain ([place]) 03/21/2012    Priority: High  . Long term current use of anticoagulant 11/20/2010    Priority: High  . History of DVT (deep vein thrombosis) 07/19/2009    Priority: High  . Human immunodeficiency virus (HIV) disease ([place]) 11/10/2006    Priority: High  . DYSLIPIDEMIA 11/10/2006    Priority: High  . CIGARETTE SMOKER 11/10/2006    Priority: High  . Opioid dependence ([place]) 10/31/2016  . Long term current use of opiate analgesic 12/17/2015  . Pressure ulcer 09/13/2015  . Protein-calorie malnutrition, severe 09/13/2015  . Avascular necrosis of femoral head ([place]) 05/14/2015  . Left adrenal mass ([place]) 05/14/2015  . Left hip pain 03/09/2013  . Preventative health care 03/09/2013  . ERECTILE DYSFUNCTION, ORGANIC 04/12/2010  . GERD 08/23/2009  . HYPOGONADISM 07/15/2009  . DEFICIENCY OF OTHER VITAMINS 07/15/2009  . Pulmonary nodule 07/15/2009  . Status post above knee amputation ([place]) 07/15/2009  . SHINGLES, RECURRENT 11/10/2006  . HEPATITIS B 11/10/2006  . Alcohol abuse 11/10/2006  . PERIPHERAL NEUROPATHY 11/10/2006  . ALLERGIC RHINITIS 11/10/2006    Patient's Medications  New Prescriptions   No medications on file  Previous Medications   ACETAMINOPHEN (TYLENOL) 500 MG TABLET    Take 1 tablet (500 mg total) by mouth every 4 (four) hours as needed for mild pain, moderate pain or headache.   ENSURE (ENSURE)    Take 1 Can by mouth 4 (four) times daily - after meals and at bedtime.   GABAPENTIN (NEURONTIN) 400 MG CAPSULE    TAKE 1 CAPSULE(400 MG) BY MOUTH FOUR TIMES DAILY   MAGNESIUM CHLORIDE (SLOW-MAG) 64 MG TBEC SR TABLET    Take 1 tablet (64 mg total) by mouth daily.   MORPHINE (MS CONTIN) 30 MG 12 HR TABLET    Take 3 tablets (90 mg total) by mouth every 8 (eight) hours.   MULTIPLE VITAMIN (MULTIVITAMIN WITH MINERALS) TABS TABLET    Take 1 tablet by mouth daily.   OXYCODONE  (OXY IR/ROXICODONE) 5 MG IMMEDIATE RELEASE TABLET    Take 1 tablet (5 mg total) by mouth 2 (two) times daily as needed for severe pain.   PANTOPRAZOLE (PROTONIX) 40 MG TABLET    TAKE 1 TABLET BY MOUTH ONCE DAILY   PHOSPHORUS (K PHOS NEUTRAL) 155-852-130 MG TABLET    Take 3 tablets (750 mg total) by mouth 2 (two) times daily.   POTASSIUM CHLORIDE SA (K-DUR,KLOR-CON) 20 MEQ TABLET    Take 1 tablet (20 mEq total) by mouth daily.   POTASSIUM CITRATE (UROCIT-K) 10 MEQ (1080 MG) SR TABLET    Take 4 tablets (40 mEq total) by mouth 2 (two) times daily with a meal.   [name] 800-150 MG TABLET    TAKE 1 TABLET BY MOUTH DAILY. SWALLOW WHOLE. DO NOT CRUSH, BREAK OR CHEW TABLETS. TAKE WITH FOOD.   SANTYL OINTMENT    APPLY TOPICALLY TO THE AFFECTED AREA(S) DAILY   SODIUM BICARBONATE 650 MG TABLET    Take 2 tablets (1,300 mg total) by mouth 3 (three) times daily.   TENOFOVIR ALAFENAMIDE FUMARATE (VEMLIDY) 25 MG TABS    Take 1 tablet (25 mg total) by mouth daily.   [name] 50 MG TABLET    TAKE 1 TABLET BY MOUTH DAILY   WARFARIN (COUMADIN) 5 MG TABLET    Take 1 & 1/2 tablets Mondays through Fridays;  on Saturdays and Sundays, take ONLY 1 tablet.  Modified Medications   No medications on file  Discontinued Medications   No medications on file    Subjective: [name] is in for his routine HIVfollow-up visit. He has had no problems obtaining his Vemlidy, [name] or [name]. He denies missing any doses. He takes them each morning. He says his phantom limb pain is under good control. He takes his MS Contin twice a day and takes his oxycodone 3 times a day regardless of his pain level. He continues to smoke cigarettes and has no current plan to quit. He spends most of his time in his electric wheelchair. He believes that his right artificial leg is too long which he says makes it difficult for him to walk.   Review of Systems: Review of Systems  Constitutional: Negative for chills, diaphoresis, fever, malaise/fatigue  and weight loss.  HENT: Negative for sore throat.   Respiratory: Negative for cough, sputum production and shortness of breath.   Cardiovascular: Negative for chest pain.  Gastrointestinal: Negative for abdominal pain, diarrhea, heartburn, nausea and vomiting.  Genitourinary: Negative for dysuria and frequency.  Musculoskeletal: Positive for joint pain. Negative for myalgias.  Skin: Negative for rash.  Neurological: Negative for dizziness and headaches.  Psychiatric/Behavioral: Negative for depression and substance abuse. The patient is not nervous/anxious.     Past Medical History:  Diagnosis Date  . Chronic kidney disease   . History of chicken pox   . History of DVT of lower extremity    right  . HIV (human immunodeficiency virus infection) ([place])   . Hyperlipidemia   . Left hip pain 03/09/2013  . Pneumonia   . Protein malnutrition ([place]) 08/2015    Social History  Substance Use Topics  . Smoking status: Current Every Day Smoker    Packs/day: 0.50    Years: 36.00    Types: Cigarettes  . Smokeless tobacco: Never Used     Comment: ABOUT 1/2PPD.  Slowing down  . Alcohol use No    Family History  Problem Relation Age of Onset  . Arthritis Mother   . Kidney disease Maternal Grandfather     Allergies  Allergen Reactions  . Dapsone     REACTION: fever  . Megestrol   . Sulfamethoxazole-Trimethoprim     REACTION: fever    Objective:  Vitals:   06/05/17 0931  BP: 100/65  Pulse: 97  Temp: 97.6 F (36.4 C)  TempSrc: Oral   There is no height or weight on file to calculate BMI.  Physical Exam  Constitutional: He is oriented to person, place, and time.  He is in good spirits. He has gained weight.  HENT:  Mouth/Throat: No oropharyngeal exudate.  Eyes: Conjunctivae are normal.  Cardiovascular: Normal rate and regular rhythm.   No murmur heard. Pulmonary/Chest: Effort normal and breath sounds normal.  Abdominal: Soft. He exhibits no mass. There is no  tenderness.  Musculoskeletal: Normal range of motion.  He is wearing his right leg prosthesis.  Neurological: He is alert and oriented to person, place, and time.  Skin: No rash noted.  Psychiatric: Mood and affect normal.    Lab Results Lab Results  Component Value Date   WBC 6.6 05/22/2017   HGB 15.3 05/22/2017   HCT 46.5 05/22/2017   MCV 92.3 05/22/2017   PLT 306 05/22/2017    Lab Results  Component Value Date   CREATININE 1.31 05/22/2017   BUN 10 05/22/2017   NA 137 05/22/2017  K 4.1 05/22/2017   CL 102 05/22/2017   CO2 27 05/22/2017    Lab Results  Component Value Date   ALT 13 05/22/2017   AST 19 05/22/2017   ALKPHOS 408 (H) 05/22/2017   BILITOT 0.6 05/22/2017    Lab Results  Component Value Date   CHOL 211 (H) 05/16/2016   HDL 40 05/16/2016   LDLCALC 133 (H) 05/16/2016   TRIG 189 (H) 05/16/2016   CHOLHDL 5.3 (H) 05/16/2016   Lab Results  Component Value Date   LABRPR NON REAC 05/22/2017   HIV 1 RNA Quant (copies/mL)  Date Value  05/22/2017 <20 NOT DETECTED  12/12/2016 44 (H)  05/16/2016 24 (H)   CD4 T Cell Abs (/uL)  Date Value  05/22/2017 740  12/12/2016 920  05/16/2016 940     Problem List Items Addressed This Visit      High   CIGARETTE SMOKER    I encouraged him again to consider cigarette cessation.      Human immunodeficiency virus (HIV) disease ([place])    His infection is under excellent, long-term control. He will continue his current 3 drug regimen and follow-up after lab work in 6 months.      Relevant Orders   T-helper cell (CD4)- ([facility] clinic only)   HIV 1 RNA quant-no reflex-bld   CBC   Comprehensive metabolic panel   RPR   Phantom limb syndrome with pain ([place])    His pain is under good control. He will continue his current regimen. I have reviewed the [place] and there are no concerns about his prescriptions.      Relevant Orders   Pain Mgmt, Profile 1 w/o Conf, U        Michel Bickers,  MD [facility] [place] [phone] pager   [phone] cell 06/05/2017, 10:00 AM

## 2017-06-05 NOTE — Assessment & Plan Note (Signed)
I encouraged him again to consider cigarette cessation. 

## 2017-06-06 ENCOUNTER — Other Ambulatory Visit: Payer: Self-pay | Admitting: *Deleted

## 2017-06-06 DIAGNOSIS — G894 Chronic pain syndrome: Secondary | ICD-10-CM

## 2017-06-06 DIAGNOSIS — G546 Phantom limb syndrome with pain: Secondary | ICD-10-CM

## 2017-06-06 LAB — PAIN MGMT, PROFILE 1 W/O CONF, U
AMPHETAMINES: NEGATIVE ng/mL (ref <500)
Barbiturates: NEGATIVE ng/mL (ref <300)
Benzodiazepines: NEGATIVE ng/mL (ref <100)
COCAINE METABOLITE: NEGATIVE ng/mL (ref <150)
CREATININE: 123.2 mg/dL (ref >=20.0)
METHADONE METABOLITE: NEGATIVE ng/mL (ref <100)
Marijuana Metabolite: POSITIVE ng/mL — AB (ref <20)
OPIATES: POSITIVE ng/mL — AB (ref <100)
Oxidant: NEGATIVE ug/mL (ref <200)
Oxycodone: POSITIVE ng/mL — AB (ref <100)
PH: 6.73 (ref 4.5–9.0)
PHENCYCLIDINE: NEGATIVE ng/mL (ref <25)

## 2017-06-06 MED ORDER — OXYCODONE HCL 5 MG PO TABS: 5.0000 mg | ORAL_TABLET | Freq: Two times a day (BID) | ORAL | 0 refills | Status: DC | PRN

## 2017-06-06 MED ORDER — MORPHINE SULFATE ER 30 MG PO TBCR: 90.0000 mg | EXTENDED_RELEASE_TABLET | Freq: Three times a day (TID) | ORAL | 0 refills | Status: DC

## 2017-06-07 ENCOUNTER — Other Ambulatory Visit: Payer: Self-pay | Admitting: Internal Medicine

## 2017-06-12 ENCOUNTER — Ambulatory Visit: Payer: Medicare Other | Admitting: Internal Medicine

## 2017-07-09 ENCOUNTER — Telehealth: Payer: Self-pay | Admitting: *Deleted

## 2017-07-09 ENCOUNTER — Ambulatory Visit (INDEPENDENT_AMBULATORY_CARE_PROVIDER_SITE_OTHER): Payer: Medicare Other | Admitting: Pharmacist

## 2017-07-09 ENCOUNTER — Other Ambulatory Visit: Payer: Self-pay | Admitting: *Deleted

## 2017-07-09 DIAGNOSIS — G894 Chronic pain syndrome: Secondary | ICD-10-CM

## 2017-07-09 DIAGNOSIS — Z7901 Long term (current) use of anticoagulants: Secondary | ICD-10-CM

## 2017-07-09 DIAGNOSIS — Z86718 Personal history of other venous thrombosis and embolism: Secondary | ICD-10-CM | POA: Diagnosis not present

## 2017-07-09 DIAGNOSIS — G546 Phantom limb syndrome with pain: Secondary | ICD-10-CM

## 2017-07-09 LAB — POCT INR: INR: 2.6

## 2017-07-09 MED ORDER — MORPHINE SULFATE ER 30 MG PO TBCR: 90.0000 mg | EXTENDED_RELEASE_TABLET | Freq: Three times a day (TID) | ORAL | 0 refills | Status: DC

## 2017-07-09 MED ORDER — OXYCODONE HCL 5 MG PO TABS: 5.0000 mg | ORAL_TABLET | Freq: Two times a day (BID) | ORAL | 0 refills | Status: DC | PRN

## 2017-07-09 NOTE — Progress Notes (Signed)
Anticoagulation Management [name] is a 56 y.o. male who reports to the clinic for monitoring of warfarin treatment.    Indication: DVT Duration: indefinite Supervising physician: Aldine Contes  Anticoagulation Clinic Visit History: Patient does not report signs/symptoms of bleeding or thromboembolism  Other recent changes: No diet, medications, lifestyle changes reported Anticoagulation Episode Summary    Current INR goal:   2.0-3.0  TTR:   66.1 % (6.4 y)  Next INR check:   07/30/2017  INR from last check:   2.6 (07/09/2017)  Weekly max warfarin dose:     Target end date:   Indefinite  INR check location:   [facility]  Preferred lab:     Send INR reminders to:   [facility]   Indications   History of DVT (deep vein thrombosis) [Z86.718] Long term current use of anticoagulant [Z79.01]       Comments:         Anticoagulation Care Providers    Provider Role Specialty Phone number   Michel Bickers, MD  Infectious Diseases [phone]      Allergies  Allergen Reactions  . Dapsone     REACTION: fever  . Megestrol   . Sulfamethoxazole-Trimethoprim     REACTION: fever   Prior to Admission medications   Medication Sig Start Date End Date Taking? Authorizing Provider  acetaminophen (TYLENOL) 500 MG tablet Take 1 tablet (500 mg total) by mouth every 4 (four) hours as needed for mild pain, moderate pain or headache. 09/22/15  Yes Shela Leff, MD  ENSURE (ENSURE) Take 1 Can by mouth 4 (four) times daily - after meals and at bedtime. 05/08/17  Yes Michel Bickers, MD  gabapentin (NEURONTIN) 400 MG capsule TAKE 1 CAPSULE(400 MG) BY MOUTH FOUR TIMES DAILY 12/15/16  Yes Michel Bickers, MD  magnesium chloride (SLOW-MAG) 64 MG TBEC SR tablet Take 1 tablet (64 mg total) by mouth daily. 10/08/15  Yes Jule Ser, DO  morphine (MS CONTIN) 30 MG 12 hr tablet Take 3 tablets (90 mg total) by mouth every 8 (eight) hours. 06/06/17  Yes Michel Bickers, MD  Multiple Vitamin  (MULTIVITAMIN WITH MINERALS) TABS tablet Take 1 tablet by mouth daily. 10/08/15  Yes Jule Ser, DO  oxyCODONE (OXY IR/ROXICODONE) 5 MG immediate release tablet Take 1 tablet (5 mg total) by mouth 2 (two) times daily as needed for severe pain. 06/06/17  Yes Michel Bickers, MD  pantoprazole (PROTONIX) 40 MG tablet TAKE 1 TABLET BY MOUTH ONCE DAILY 06/08/17  Yes Valinda Party, DO  phosphorus (K PHOS NEUTRAL) 155-852-130 MG tablet Take 3 tablets (750 mg total) by mouth 2 (two) times daily. 10/08/15  Yes Jule Ser, DO  potassium chloride SA (K-DUR,KLOR-CON) 20 MEQ tablet Take 1 tablet (20 mEq total) by mouth daily. 05/10/17  Yes Hoffman, Jessica Ratliff, DO  potassium citrate (UROCIT-K) 10 MEQ (1080 MG) SR tablet Take 4 tablets (40 mEq total) by mouth 2 (two) times daily with a meal. 09/22/15  Yes Shela Leff, MD  [name] 800-150 MG tablet TAKE 1 TABLET BY MOUTH DAILY. SWALLOW WHOLE. DO NOT CRUSH, BREAK OR CHEW TABLETS. TAKE WITH FOOD. 05/31/17  Yes Michel Bickers, MD  SANTYL ointment APPLY TOPICALLY TO THE AFFECTED AREA(S) DAILY 11/05/15  Yes Iline Oven, MD  sodium bicarbonate 650 MG tablet Take 2 tablets (1,300 mg total) by mouth 3 (three) times daily. 10/08/15  Yes Jule Ser, DO  Tenofovir Alafenamide Fumarate (VEMLIDY) 25 MG TABS Take 1 tablet (25 mg total) by mouth daily.  12/12/16  Yes Michel Bickers, MD  [name] 50 MG tablet TAKE 1 TABLET BY MOUTH DAILY 05/31/17  Yes Michel Bickers, MD  warfarin (COUMADIN) 5 MG tablet Take 1 & 1/2 tablets Mondays through Fridays; on Saturdays and Sundays, take ONLY 1 tablet. 05/10/17  Yes Valinda Party, DO   Past Medical History:  Diagnosis Date  . Chronic kidney disease   . History of chicken pox   . History of DVT of lower extremity    right  . HIV (human immunodeficiency virus infection) ([place])   . Hyperlipidemia   . Left hip pain 03/09/2013  . Pneumonia   . Protein malnutrition ([place]) 08/2015   Social History    Social History  . Marital status: Widowed    Spouse name: N/A  . Number of children: 3  . Years of education: N/A   Occupational History  .  Unemployed   Social History Main Topics  . Smoking status: Current Every Day Smoker    Packs/day: 0.50    Years: 36.00    Types: Cigarettes  . Smokeless tobacco: Never Used     Comment: ABOUT 1/2PPD.  Slowing down  . Alcohol use No  . Drug use: No  . Sexual activity: Not Currently     Comment: declined condoms   Other Topics Concern  . Not on file   Social History Narrative  . No narrative on file   Family History  Problem Relation Age of Onset  . Arthritis Mother   . Kidney disease Maternal Grandfather     ASSESSMENT Recent Results: The most recent result is correlated with 47.5 mg per week: Lab Results  Component Value Date   INR 2.6 07/09/2017   INR 2.20 06/04/2017   INR 2.20 04/30/2017   PROTIME 38.4 (H) 04/01/2012    Anticoagulation Dosing: INR as of 07/09/2017 and Previous Warfarin Dosing Information    INR Dt INR Goal [employer] Sun Mon Tue Wed Thu Fri Sat   07/09/2017 2.6 2.0-3.0 47.5 mg 5 mg 7.5 mg 7.5 mg 7.5 mg 7.5 mg 7.5 mg 5 mg    Previous description   Take 1 & 1/2 tablets of your peach-colored 5mg  strength warfarin tablets Mondays through Fridays; on Saturdays and Sundays, take ONLY 1 tablet.    Anticoagulation Warfarin Dose Instructions as of 07/09/2017      Total Sun Mon Tue Wed Thu Fri Sat   New Dose 47.5 mg 5 mg 7.5 mg 7.5 mg 7.5 mg 7.5 mg 7.5 mg 5 mg     (5 mg x 1)  (5 mg x 1.5)  (5 mg x 1.5)  (5 mg x 1.5)  (5 mg x 1.5)  (5 mg x 1.5)  (5 mg x 1)                         Description   Take 1 & 1/2 tablets of your peach-colored 5mg  strength warfarin tablets Mondays through Fridays; on Saturdays and Sundays, take ONLY 1 tablet.      INR today: Therapeutic  PLAN Weekly dose was unchanged   Patient Instructions  Patient instructed to take medications as defined in the Anti-coagulation Track  section of this encounter.  Patient instructed to take today's dose.  Patient instructed to take 1 & 1/2 tablets of your peach-colored 5mg  strength warfarin tablets Mondays through Fridays; on Saturdays and Sundays, take ONLY 1 tablet.  Patient verbalized understanding of these instructions.  Patient advised to contact clinic or seek medical attention if signs/symptoms of bleeding or thromboembolism occur.  Patient verbalized understanding by repeating back information and was advised to contact me if further medication-related questions arise. Patient was also provided an information handout.  Follow-up Return in 3 weeks (on 07/30/2017) for Follow up INR at 0930.  Mila Merry Gerarda Fraction, PharmD PGY1 Pharmacy Resident Pager: [phone]  15 minutes spent face-to-face with the patient during the encounter. 50% of time spent on education. 50% of time was spent on point of care fingerstick INR test, processing, results interpretation, and documentation in [facility] and [email or web address]

## 2017-07-09 NOTE — Telephone Encounter (Signed)
Patient called for printed Rx for his pain medications. Placed in Dr. Hale Bogus pod for signature. Myrtis Hopping

## 2017-07-09 NOTE — Patient Instructions (Addendum)
Patient instructed to take medications as defined in the Anti-coagulation Track section of this encounter.  Patient instructed to take today's dose.  Patient instructed to take 1 & 1/2 tablets of your peach-colored 5mg  strength warfarin tablets Mondays through Fridays; on Saturdays and Sundays, take ONLY 1 tablet.  Patient verbalized understanding of these instructions.

## 2017-07-09 NOTE — Progress Notes (Signed)
INTERNAL MEDICINE TEACHING ATTENDING ADDENDUM - Buna Cuppett M.D  Duration- indefinite, Indication- DVT, INR- therapeutic. Agree with pharmacy recommendations as outlined in their note.     

## 2017-07-19 ENCOUNTER — Ambulatory Visit (INDEPENDENT_AMBULATORY_CARE_PROVIDER_SITE_OTHER): Payer: Medicare Other | Admitting: Internal Medicine

## 2017-07-19 VITALS — BP 134/69 | HR 94 | Temp 98.2°F | Ht 71.0 in | Wt 129.6 lb

## 2017-07-19 DIAGNOSIS — Z86718 Personal history of other venous thrombosis and embolism: Secondary | ICD-10-CM

## 2017-07-19 DIAGNOSIS — R918 Other nonspecific abnormal finding of lung field: Secondary | ICD-10-CM

## 2017-07-19 DIAGNOSIS — Z44101 Encounter for fitting and adjustment of unspecified right artificial leg: Secondary | ICD-10-CM

## 2017-07-19 DIAGNOSIS — Z7901 Long term (current) use of anticoagulants: Secondary | ICD-10-CM

## 2017-07-19 DIAGNOSIS — Z993 Dependence on wheelchair: Secondary | ICD-10-CM

## 2017-07-19 DIAGNOSIS — Z449 Encounter for fitting and adjustment of unspecified external prosthetic device: Secondary | ICD-10-CM

## 2017-07-19 DIAGNOSIS — F17210 Nicotine dependence, cigarettes, uncomplicated: Secondary | ICD-10-CM | POA: Diagnosis not present

## 2017-07-19 DIAGNOSIS — F17200 Nicotine dependence, unspecified, uncomplicated: Secondary | ICD-10-CM

## 2017-07-19 DIAGNOSIS — R911 Solitary pulmonary nodule: Secondary | ICD-10-CM

## 2017-07-19 NOTE — Progress Notes (Signed)
   CC: Follow up on right lower extremity prosthesis  HPI:  [name] is a 56 y.o. male with history noted below that presents to the internal medicine clinic for follow-up on right lower extremity prosthesis.  Please see problem based charting for the status of patient's chronic medical conditions.  Of note patient left after physical exam.  I was unaware he wanted to leave and AVS was unable to be given.    Past Medical History:  Diagnosis Date  . Chronic kidney disease   . History of chicken pox   . History of DVT of lower extremity    right  . HIV (human immunodeficiency virus infection) ([place])   . Hyperlipidemia   . Left hip pain 03/09/2013  . Pneumonia   . Protein malnutrition ([place]) 08/2015    Review of Systems:  Review of Systems  Respiratory: Negative for shortness of breath.   Cardiovascular: Negative for chest pain.  Musculoskeletal: Negative for falls.     Physical Exam:  Vitals:   07/19/17 1326  BP: 134/69  Pulse: 94  Temp: 98.2 F (36.8 C)  TempSrc: Oral  SpO2: 94%  Weight: 129 lb 9.6 oz (58.8 kg)  Height: 5\' 11"  (1.803 m)   Physical Exam  Constitutional: He is well-developed, well-nourished, and in no distress.  Uses electric wheelchair  Cardiovascular: Normal rate, regular rhythm and normal heart sounds.  Exam reveals no gallop and no friction rub.   No murmur heard. Pulmonary/Chest: Effort normal and breath sounds normal. No respiratory distress. He has no wheezes. He has no rales.  Musculoskeletal:  Right lower extremity prosthesis  Skin: Skin is warm and dry.    Assessment & Plan:   See encounters tab for problem based medical decision making.    Patient discussed with Dr. Evette Doffing

## 2017-07-20 DIAGNOSIS — Z449 Encounter for fitting and adjustment of unspecified external prosthetic device: Secondary | ICD-10-CM | POA: Insufficient documentation

## 2017-07-20 NOTE — Assessment & Plan Note (Signed)
Assessment:  tobacco abuse Patient continues to smoke cigarettes.  He smokes1 pack a day.  I encouraged patient to quit smoking and offered nicotine patch. Patient states he is going to quit on his own and is not ready to quit today.    Plan - reassess readiness to quit smoking in future visits

## 2017-07-20 NOTE — Assessment & Plan Note (Signed)
Assessment:  Right lower extremity prosthesis  Patient has gained 7 lbs over the past 6 months.  He states that his prosthesis does not fit properly, mainly that the device does not fit all the way up like it used to on his thigh. He states he obtained his prosthesis from [employer].  Patient states he is able to walk on the prosthesis but doesn't feel stable.  I offered physical therapy and patient declined.  I suspect the instability is due to ill fitting prosthesis.  Plan -order faxed in office to [employer] for re-evaluation of prosthesis for proper fitting

## 2017-07-20 NOTE — Progress Notes (Signed)
Internal Medicine Clinic Attending  Case discussed with Dr. Hoffman at the time of the visit.  We reviewed the resident's history and exam and pertinent patient test results.  I agree with the assessment, diagnosis, and plan of care documented in the resident's note.  

## 2017-07-20 NOTE — Assessment & Plan Note (Signed)
Assessment:  History of DVT Followed by Dr. Elie Confer.  Last seen 8/6/18by Dr. Elie Confer.  Last INR was 2.6 on 07/09/2017.     Plan Continue follow up at coumadin clinic

## 2017-07-20 NOTE — Assessment & Plan Note (Signed)
Assessment:  Pulmonary nodule CT in august 2010 patient was found to have multiple small ground glass nodules within the right and left lungs.  Recommended repeat Chest CT was suggested and on 12/2016 visit this was ordered.  Patient declined to have scan done after ordered because he did not want contrast.  Chest CT without contrast was offered to evaluate pulmonary nodule and patient declined.    Plan -offered non contrast CT scan, patient declined

## 2017-07-24 ENCOUNTER — Telehealth: Payer: Self-pay | Admitting: Internal Medicine

## 2017-07-24 NOTE — Telephone Encounter (Signed)
Rec'd call from [facility] "[name]" stating order was faxed without a signature and it can not be accepted.  They have faxed the Original Prescription Back and need a new one as the patient has called several times in reference to his Order for a New Prosthetic.  Please Advise.

## 2017-07-30 ENCOUNTER — Ambulatory Visit (INDEPENDENT_AMBULATORY_CARE_PROVIDER_SITE_OTHER): Payer: Medicare Other | Admitting: Pharmacist

## 2017-07-30 ENCOUNTER — Other Ambulatory Visit: Payer: Self-pay | Admitting: Internal Medicine

## 2017-07-30 DIAGNOSIS — Z7901 Long term (current) use of anticoagulants: Secondary | ICD-10-CM

## 2017-07-30 DIAGNOSIS — Z86718 Personal history of other venous thrombosis and embolism: Secondary | ICD-10-CM

## 2017-07-30 DIAGNOSIS — Z89611 Acquired absence of right leg above knee: Secondary | ICD-10-CM

## 2017-07-30 LAB — POCT INR: INR: 2.2

## 2017-07-30 NOTE — Patient Instructions (Signed)
Patient instructed to take medications as defined in the Anti-coagulation Track section of this encounter.  Patient instructed to take today's dose.  Patient instructed to take 1 & 1/2 tablets of your peach-colored 5mg  strength warfarin tablets Mondays through Fridays; on Saturdays and Sundays, take ONLY 1 tablet.  Patient verbalized understanding of these instructions.

## 2017-07-30 NOTE — Progress Notes (Signed)
Anticoagulation Management [name] is a 56 y.o. male who reports to the clinic for monitoring of warfarin treatment.    Indication: DVT  Duration: indefinite Supervising physician: Joni Reining  Anticoagulation Clinic Visit History: Patient does not report signs/symptoms of bleeding or thromboembolism  Other recent changes: No changes in diet, medications, lifestyle reported Anticoagulation Episode Summary    Current INR goal:   2.0-3.0  TTR:   66.4 % (6.5 y)  Next INR check:   09/10/2017  INR from last check:   2.2 (07/30/2017)  Weekly max warfarin dose:     Target end date:   Indefinite  INR check location:   [facility]  Preferred lab:     Send INR reminders to:   [facility]   Indications   History of DVT (deep vein thrombosis) [Z86.718] Long term current use of anticoagulant [Z79.01]       Comments:         Anticoagulation Care Providers    Provider Role Specialty Phone number   Michel Bickers, MD  Infectious Diseases [phone]      Allergies  Allergen Reactions  . Dapsone     REACTION: fever  . Megestrol   . Sulfamethoxazole-Trimethoprim     REACTION: fever   Prior to Admission medications   Medication Sig Start Date End Date Taking? Authorizing Provider  acetaminophen (TYLENOL) 500 MG tablet Take 1 tablet (500 mg total) by mouth every 4 (four) hours as needed for mild pain, moderate pain or headache. 09/22/15  Yes Shela Leff, MD  ENSURE (ENSURE) Take 1 Can by mouth 4 (four) times daily - after meals and at bedtime. 05/08/17  Yes Michel Bickers, MD  gabapentin (NEURONTIN) 400 MG capsule TAKE 1 CAPSULE(400 MG) BY MOUTH FOUR TIMES DAILY 12/15/16  Yes Michel Bickers, MD  magnesium chloride (SLOW-MAG) 64 MG TBEC SR tablet Take 1 tablet (64 mg total) by mouth daily. 10/08/15  Yes Jule Ser, DO  morphine (MS CONTIN) 30 MG 12 hr tablet Take 3 tablets (90 mg total) by mouth every 8 (eight) hours. 07/09/17  Yes Michel Bickers, MD  Multiple Vitamin  (MULTIVITAMIN WITH MINERALS) TABS tablet Take 1 tablet by mouth daily. 10/08/15  Yes Jule Ser, DO  oxyCODONE (OXY IR/ROXICODONE) 5 MG immediate release tablet Take 1 tablet (5 mg total) by mouth 2 (two) times daily as needed for severe pain. 07/09/17  Yes Michel Bickers, MD  pantoprazole (PROTONIX) 40 MG tablet TAKE 1 TABLET BY MOUTH ONCE DAILY 06/08/17  Yes Valinda Party, DO  phosphorus (K PHOS NEUTRAL) 155-852-130 MG tablet Take 3 tablets (750 mg total) by mouth 2 (two) times daily. 10/08/15  Yes Jule Ser, DO  potassium chloride SA (K-DUR,KLOR-CON) 20 MEQ tablet Take 1 tablet (20 mEq total) by mouth daily. 05/10/17  Yes Hoffman, Jessica Ratliff, DO  potassium citrate (UROCIT-K) 10 MEQ (1080 MG) SR tablet Take 4 tablets (40 mEq total) by mouth 2 (two) times daily with a meal. 09/22/15  Yes Shela Leff, MD  [name] 800-150 MG tablet TAKE 1 TABLET BY MOUTH DAILY. SWALLOW WHOLE. DO NOT CRUSH, BREAK OR CHEW TABLETS. TAKE WITH FOOD. 05/31/17  Yes Michel Bickers, MD  SANTYL ointment APPLY TOPICALLY TO THE AFFECTED AREA(S) DAILY 11/05/15  Yes Iline Oven, MD  sodium bicarbonate 650 MG tablet Take 2 tablets (1,300 mg total) by mouth 3 (three) times daily. 10/08/15  Yes Jule Ser, DO  Tenofovir Alafenamide Fumarate (VEMLIDY) 25 MG TABS Take 1 tablet (25 mg total) by  mouth daily. 12/12/16  Yes Michel Bickers, MD  [name] 50 MG tablet TAKE 1 TABLET BY MOUTH DAILY 05/31/17  Yes Michel Bickers, MD  warfarin (COUMADIN) 5 MG tablet Take 1 & 1/2 tablets Mondays through Fridays; on Saturdays and Sundays, take ONLY 1 tablet. 05/10/17  Yes Valinda Party, DO   Past Medical History:  Diagnosis Date  . Chronic kidney disease   . History of chicken pox   . History of DVT of lower extremity    right  . HIV (human immunodeficiency virus infection) ([place])   . Hyperlipidemia   . Left hip pain 03/09/2013  . Pneumonia   . Protein malnutrition ([place]) 08/2015   Social History    Social History  . Marital status: Widowed    Spouse name: N/A  . Number of children: 3  . Years of education: N/A   Occupational History  .  Unemployed   Social History Main Topics  . Smoking status: Current Every Day Smoker    Packs/day: 0.50    Years: 36.00    Types: Cigarettes  . Smokeless tobacco: Never Used     Comment: ABOUT 1/2PPD.  Slowing down  . Alcohol use No  . Drug use: No  . Sexual activity: Not Currently     Comment: declined condoms   Other Topics Concern  . Not on file   Social History Narrative  . No narrative on file   Family History  Problem Relation Age of Onset  . Arthritis Mother   . Kidney disease Maternal Grandfather     ASSESSMENT Recent Results: The most recent result is correlated with 47.5 mg per week: Lab Results  Component Value Date   INR 2.2 07/30/2017   INR 2.6 07/09/2017   INR 2.20 06/04/2017   PROTIME 38.4 (H) 04/01/2012    Anticoagulation Dosing: INR as of 07/30/2017 and Previous Warfarin Dosing Information    INR Dt INR Goal Madilyn Fireman Sun Mon Tue Wed Thu Fri Sat   07/30/2017 2.2 2.0-3.0 47.5 mg 5 mg 7.5 mg 7.5 mg 7.5 mg 7.5 mg 7.5 mg 5 mg    Previous description   Take 1 & 1/2 tablets of your peach-colored 5mg  strength warfarin tablets Mondays through Fridays; on Saturdays and Sundays, take ONLY 1 tablet.    Anticoagulation Warfarin Dose Instructions as of 07/30/2017      Total Sun Mon Tue Wed Thu Fri Sat   New Dose 47.5 mg 5 mg 7.5 mg 7.5 mg 7.5 mg 7.5 mg 7.5 mg 5 mg     (5 mg x 1)  (5 mg x 1.5)  (5 mg x 1.5)  (5 mg x 1.5)  (5 mg x 1.5)  (5 mg x 1.5)  (5 mg x 1)                         Description   Take 1 & 1/2 tablets of your peach-colored 5mg  strength warfarin tablets Mondays through Fridays; on Saturdays and Sundays, take ONLY 1 tablet.      INR today: Therapeutic  PLAN Weekly dose was unchanged  Patient Instructions  Patient instructed to take medications as defined in the Anti-coagulation Track  section of this encounter.  Patient instructed to take today's dose.  Patient instructed to take 1 & 1/2 tablets of your peach-colored 5mg  strength warfarin tablets Mondays through Fridays; on Saturdays and Sundays, take ONLY 1 tablet.  Patient verbalized understanding of these instructions.  Patient advised to contact clinic or seek medical attention if signs/symptoms of bleeding or thromboembolism occur.  Patient verbalized understanding by repeating back information and was advised to contact me if further medication-related questions arise. Patient was also provided an information handout.  Follow-up Return in 6 weeks (on 09/10/2017) for Follow up INR at 0930.  Mila Merry Gerarda Fraction, PharmD PGY1 Pharmacy Resident Pager: [phone]  15 minutes spent face-to-face with the patient during the encounter. 50% of time spent on education. 50% of time was spent on point of care INR fingerstick, results interpretation, and documentation in [facility] and [email or web address]

## 2017-07-30 NOTE — Progress Notes (Signed)
INTERNAL MEDICINE TEACHING ATTENDING ADDENDUM - Lucious Groves, DO Duration- indefinate, Indication- dvt, INR-  Lab Results  Component Value Date   INR 2.2 07/30/2017   PROTIME 38.4 (H) 04/01/2012  . Agree with pharmacy recommendations as outlined in their note.

## 2017-08-07 ENCOUNTER — Other Ambulatory Visit: Payer: Self-pay | Admitting: *Deleted

## 2017-08-07 DIAGNOSIS — G546 Phantom limb syndrome with pain: Secondary | ICD-10-CM

## 2017-08-07 DIAGNOSIS — G894 Chronic pain syndrome: Secondary | ICD-10-CM

## 2017-08-07 MED ORDER — OXYCODONE HCL 5 MG PO TABS: 5.0000 mg | ORAL_TABLET | Freq: Two times a day (BID) | ORAL | 0 refills | Status: DC | PRN

## 2017-08-07 MED ORDER — MORPHINE SULFATE ER 30 MG PO TBCR: 90.0000 mg | EXTENDED_RELEASE_TABLET | Freq: Three times a day (TID) | ORAL | 0 refills | Status: DC

## 2017-08-20 ENCOUNTER — Telehealth: Payer: Self-pay

## 2017-08-20 NOTE — Telephone Encounter (Signed)
Faxed Child psychotherapist [facility] [employer] form @ [phone] on 08/20/2017.

## 2017-09-06 ENCOUNTER — Other Ambulatory Visit: Payer: Self-pay | Admitting: *Deleted

## 2017-09-06 DIAGNOSIS — G894 Chronic pain syndrome: Secondary | ICD-10-CM

## 2017-09-06 DIAGNOSIS — G546 Phantom limb syndrome with pain: Secondary | ICD-10-CM

## 2017-09-06 MED ORDER — OXYCODONE HCL 5 MG PO TABS: 5.0000 mg | ORAL_TABLET | Freq: Two times a day (BID) | ORAL | 0 refills | Status: DC | PRN

## 2017-09-06 MED ORDER — MORPHINE SULFATE ER 30 MG PO TBCR: 90.0000 mg | EXTENDED_RELEASE_TABLET | Freq: Three times a day (TID) | ORAL | 0 refills | Status: DC

## 2017-09-07 ENCOUNTER — Other Ambulatory Visit: Payer: Self-pay | Admitting: Internal Medicine

## 2017-09-07 ENCOUNTER — Other Ambulatory Visit: Payer: Medicare Other

## 2017-09-07 DIAGNOSIS — Z0289 Encounter for other administrative examinations: Secondary | ICD-10-CM

## 2017-09-07 DIAGNOSIS — G629 Polyneuropathy, unspecified: Secondary | ICD-10-CM

## 2017-09-08 LAB — PAIN MGMT, PROFILE 1 W/O CONF, U
Amphetamines: NEGATIVE ng/mL (ref <500)
Barbiturates: NEGATIVE ng/mL (ref <300)
Benzodiazepines: NEGATIVE ng/mL (ref <100)
COCAINE METABOLITE: NEGATIVE ng/mL (ref <150)
Creatinine: 97.4 mg/dL
Marijuana Metabolite: POSITIVE ng/mL — AB (ref <20)
Methadone Metabolite: NEGATIVE ng/mL (ref <100)
OXIDANT: NEGATIVE ug/mL (ref <200)
OXYCODONE: POSITIVE ng/mL — AB (ref <100)
Opiates: POSITIVE ng/mL — AB (ref <100)
PH: 6.67 (ref 4.5–9.0)
PHENCYCLIDINE: NEGATIVE ng/mL (ref <25)

## 2017-09-10 ENCOUNTER — Ambulatory Visit (INDEPENDENT_AMBULATORY_CARE_PROVIDER_SITE_OTHER): Payer: Medicare Other | Admitting: Pharmacist

## 2017-09-10 DIAGNOSIS — Z86718 Personal history of other venous thrombosis and embolism: Secondary | ICD-10-CM

## 2017-09-10 DIAGNOSIS — Z7901 Long term (current) use of anticoagulants: Secondary | ICD-10-CM

## 2017-09-10 DIAGNOSIS — I749 Embolism and thrombosis of unspecified artery: Secondary | ICD-10-CM | POA: Diagnosis not present

## 2017-09-10 LAB — POCT INR: INR: 2.6

## 2017-09-10 NOTE — Progress Notes (Signed)
INTERNAL MEDICINE TEACHING ATTENDING ADDENDUM - Matthew Groves, DO Duration- indefinate, Indication- VTE, INR-  Lab Results  Component Value Date   INR 2.6 09/10/2017   PROTIME 38.4 (H) 04/01/2012  . Agree with pharmacy recommendations as outlined in their note.

## 2017-09-10 NOTE — Progress Notes (Signed)
Anticoagulation Management [name] is a 56 y.o. male who reports to the clinic for monitoring of warfarin treatment.    Indication: DVT  Duration: indefinite Supervising physician: Joni Reining  Anticoagulation Clinic Visit History: Patient does not report signs/symptoms of bleeding or thromboembolism  Other recent changes: Patient denies any diet, medications, or lifestyle changes. Anticoagulation Episode Summary    Current INR goal:   2.0-3.0  TTR:   66.9 % (6.6 y)  Next INR check:   10/08/2017  INR from last check:   2.6 (09/10/2017)  Weekly max warfarin dose:     Target end date:   Indefinite  [facility] check [facility]   [facility]  Preferred lab:     Send INR reminders to:   [facility]   Indications   History of DVT (deep vein thrombosis) [Z86.718] Long term current use of anticoagulant [Z79.01]       Comments:         Anticoagulation Care Providers    Provider Role Specialty Phone number   Michel Bickers, MD  Infectious Diseases [phone]      Allergies  Allergen Reactions  . Dapsone     REACTION: fever  . Megestrol   . Sulfamethoxazole-Trimethoprim     REACTION: fever   Prior to Admission medications   Medication Sig Start Date End Date Taking? Authorizing Provider  acetaminophen (TYLENOL) 500 MG tablet Take 1 tablet (500 mg total) by mouth every 4 (four) hours as needed for mild pain, moderate pain or headache. 09/22/15   Shela Leff, MD  ENSURE (ENSURE) Take 1 Can by mouth 4 (four) times daily - after meals and at bedtime. 05/08/17   Michel Bickers, MD  gabapentin (NEURONTIN) 400 MG capsule TAKE 1 CAPSULE(400 MG) BY MOUTH FOUR TIMES DAILY 09/07/17   Michel Bickers, MD  magnesium chloride (SLOW-MAG) 64 MG TBEC SR tablet Take 1 tablet (64 mg total) by mouth daily. 10/08/15   Jule Ser, DO  morphine (MS CONTIN) 30 MG 12 hr tablet Take 3 tablets (90 mg total) every 8 (eight) hours by mouth. 09/06/17   Carlyle Basques, MD  Multiple Vitamin  (MULTIVITAMIN WITH MINERALS) TABS tablet Take 1 tablet by mouth daily. 10/08/15   Jule Ser, DO  oxyCODONE (OXY IR/ROXICODONE) 5 MG immediate release tablet Take 1 tablet (5 mg total) 2 (two) times daily as needed by mouth for severe pain. 09/06/17   Carlyle Basques, MD  pantoprazole (PROTONIX) 40 MG tablet TAKE 1 TABLET BY MOUTH ONCE DAILY 06/08/17   Kalman Shan Ratliff, DO  phosphorus (K PHOS NEUTRAL) 155-852-130 MG tablet Take 3 tablets (750 mg total) by mouth 2 (two) times daily. 10/08/15   Jule Ser, DO  potassium chloride SA (K-DUR,KLOR-CON) 20 MEQ tablet Take 1 tablet (20 mEq total) by mouth daily. 05/10/17   Kalman Shan Ratliff, DO  potassium citrate (UROCIT-K) 10 MEQ (1080 MG) SR tablet Take 4 tablets (40 mEq total) by mouth 2 (two) times daily with a meal. 09/22/15   Shela Leff, MD  [name] 800-150 MG tablet TAKE 1 TABLET BY MOUTH DAILY. SWALLOW WHOLE. DO NOT CRUSH, BREAK OR CHEW TABLETS. TAKE WITH FOOD. 05/31/17   Michel Bickers, MD  SANTYL ointment APPLY TOPICALLY TO THE AFFECTED AREA(S) DAILY 11/05/15   Iline Oven, MD  sodium bicarbonate 650 MG tablet Take 2 tablets (1,300 mg total) by mouth 3 (three) times daily. 10/08/15   Jule Ser, DO  Tenofovir Alafenamide Fumarate (VEMLIDY) 25 MG TABS Take 1 tablet (25 mg total)  by mouth daily. 12/12/16   Michel Bickers, MD  [name] 50 MG tablet TAKE 1 TABLET BY MOUTH DAILY 05/31/17   Michel Bickers, MD  warfarin (COUMADIN) 5 MG tablet TAKE 1 AND 1/2 TABLETS BY MOUTH MONDAYS THROUGH FRIDAYS, ON SATURDAYS AND SUNDAYS, TAKE ONLY 1 TABLET AS DIRECTED 09/09/17   Valinda Party, DO   Past Medical History:  Diagnosis Date  . Chronic kidney disease   . History of chicken pox   . History of DVT of lower extremity    right  . HIV (human immunodeficiency virus infection) ([place])   . Hyperlipidemia   . Left hip pain 03/09/2013  . Pneumonia   . Protein malnutrition ([place]) 08/2015   Social History    Socioeconomic History  . Marital status: Widowed    Spouse name: Not on file  . Number of children: 3  . Years of education: Not on file  . Highest education level: Not on file  Social Needs  . Financial resource strain: Not on file  . Food insecurity - worry: Not on file  . Food insecurity - inability: Not on file  . Transportation needs - medical: Not on file  . Transportation needs - non-medical: Not on file  Occupational History    Employer: UNEMPLOYED  Tobacco Use  . Smoking status: Current Every Day Smoker    Packs/day: 0.50    Years: 36.00    Pack years: 18.00    Types: Cigarettes  . Smokeless tobacco: Never Used  . Tobacco comment: ABOUT 1/2PPD.  Slowing down  Substance and Sexual Activity  . Alcohol use: No    Alcohol/week: 0.0 oz  . Drug use: No  . Sexual activity: Not Currently    Comment: declined condoms  Other Topics Concern  . Not on file  Social History Narrative  . Not on file   Family History  Problem Relation Age of Onset  . Arthritis Mother   . Kidney disease Maternal Grandfather     ASSESSMENT Recent Results: The most recent result is correlated with 47.5 mg per week: Lab Results  Component Value Date   INR 2.6 09/10/2017   INR 2.2 07/30/2017   INR 2.6 07/09/2017   PROTIME 38.4 (H) 04/01/2012    Anticoagulation Dosing: Description   Take 1 & 1/2 tablets of your peach-colored 5mg  strength warfarin tablets Mondays through Fridays; on Saturdays and Sundays, take ONLY 1 tablet.      INR today: Therapeutic  PLAN Weekly dose was unchanged. Continue current regimen.  Patient Instructions  Patient instructed to take medications as defined in the Anti-coagulation Track section of this encounter.  Patient instructed to take today's dose.  Patient was instructed to take 1 & 1/2 tablets of your peach-colored 5mg  strength warfarin tablets Mondays through Fridays; on Saturdays and Sundays, take ONLY 1 tablet.  Patient verbalized understanding  of these instructions.     Patient advised to contact clinic or seek medical attention if signs/symptoms of bleeding or thromboembolism occur.  Patient verbalized understanding by repeating back information and was advised to contact me if further medication-related questions arise. Patient was also provided an information handout.  Follow-up Return in about 4 weeks (around 10/08/2017) for INR follow up at 1000.  Patterson Hammersmith PharmD PGY1 Pharmacy Practice Resident 09/10/2017 10:36 AM  30 minutes spent face-to-face with the patient during the encounter. 50% of time spent on education. 50% of time was spent on collection of INR sample, interpretation of results, discussion on plan,  and documentation into [email or web address] and EPIC.

## 2017-09-10 NOTE — Patient Instructions (Signed)
Patient instructed to take medications as defined in the Anti-coagulation Track section of this encounter.  Patient instructed to take today's dose.  Patient was instructed to take 1 & 1/2 tablets of your peach-colored 5mg  strength warfarin tablets Mondays through Fridays; on Saturdays and Sundays, take ONLY 1 tablet.  Patient verbalized understanding of these instructions.

## 2017-09-11 ENCOUNTER — Other Ambulatory Visit: Payer: Self-pay | Admitting: Internal Medicine

## 2017-09-11 DIAGNOSIS — B20 Human immunodeficiency virus [HIV] disease: Secondary | ICD-10-CM

## 2017-10-04 ENCOUNTER — Other Ambulatory Visit: Payer: Self-pay | Admitting: *Deleted

## 2017-10-04 DIAGNOSIS — G546 Phantom limb syndrome with pain: Secondary | ICD-10-CM

## 2017-10-04 DIAGNOSIS — G894 Chronic pain syndrome: Secondary | ICD-10-CM

## 2017-10-04 MED ORDER — MORPHINE SULFATE ER 30 MG PO TBCR: 90.0000 mg | EXTENDED_RELEASE_TABLET | Freq: Three times a day (TID) | ORAL | 0 refills | Status: DC

## 2017-10-04 MED ORDER — OXYCODONE HCL 5 MG PO TABS: 5.0000 mg | ORAL_TABLET | Freq: Two times a day (BID) | ORAL | 0 refills | Status: DC | PRN

## 2017-10-08 ENCOUNTER — Ambulatory Visit: Payer: Medicare Other

## 2017-10-15 ENCOUNTER — Ambulatory Visit (INDEPENDENT_AMBULATORY_CARE_PROVIDER_SITE_OTHER): Payer: Medicare Other | Admitting: Pharmacist

## 2017-10-15 DIAGNOSIS — Z7901 Long term (current) use of anticoagulants: Secondary | ICD-10-CM

## 2017-10-15 DIAGNOSIS — Z86718 Personal history of other venous thrombosis and embolism: Secondary | ICD-10-CM

## 2017-10-15 LAB — POCT INR: INR: 2.9

## 2017-10-15 NOTE — Patient Instructions (Signed)
Patient instructed to take medications as defined in the Anti-coagulation Track section of this encounter.  Patient instructed to  today's dose.  Patient instructed to take 1 & 1/2 tablets of your peach-colored 5mg  strength warfarin tablets Sundays, Tuesdays, Thursdays and Saturdays; on Mondays, Wednesdays and Fridays,  ONLY 1 tablet.  Patient verbalized understanding of these instructions.

## 2017-10-15 NOTE — Progress Notes (Signed)
Anticoagulation Management [name] is a 56 y.o. male who reports to the clinic for monitoring of warfarin treatment.    Indication: DVT, history of (deep vein thrombosis), long term current use of anticoagulant. Duration: indefinite Supervising physician: Oval Linsey  Anticoagulation Clinic Visit History: Patient does not report signs/symptoms of bleeding or thromboembolism  Other recent changes: No diet, medications, lifestyle changes endorsed by the patient to me at this visit.  Anticoagulation Episode Summary    Current INR goal:   2.0-3.0  TTR:   67.4 % (6.7 y)  Next INR check:   11/12/2017  INR from last check:   2.90 (10/15/2017)  Weekly max warfarin dose:     Target end date:   Indefinite  [facility]   [facility]  Preferred lab:     Send INR reminders to:   [facility]   Indications   History of DVT (deep vein thrombosis) [Z86.718] Long term current use of anticoagulant [Z79.01]       Comments:         Anticoagulation Care Providers    Provider Role Specialty Phone number   Michel Bickers, MD  Infectious Diseases [phone]      Allergies  Allergen Reactions  . Dapsone     REACTION: fever  . Megestrol   . Sulfamethoxazole-Trimethoprim     REACTION: fever   Prior to Admission medications   Medication Sig Start Date End Date Taking? Authorizing Provider  acetaminophen (TYLENOL) 500 MG tablet Take 1 tablet (500 mg total) by mouth every 4 (four) hours as needed for mild pain, moderate pain or headache. 09/22/15  Yes Shela Leff, MD  ENSURE (ENSURE) Take 1 Can by mouth 4 (four) times daily - after meals and at bedtime. 05/08/17  Yes Michel Bickers, MD  gabapentin (NEURONTIN) 400 MG capsule TAKE 1 CAPSULE(400 MG) BY MOUTH FOUR TIMES DAILY 09/07/17  Yes Michel Bickers, MD  magnesium chloride (SLOW-MAG) 64 MG TBEC SR tablet Take 1 tablet (64 mg total) by mouth daily. 10/08/15  Yes Jule Ser, DO  morphine (MS CONTIN) 30 MG 12 hr tablet  Take 3 tablets (90 mg total) by mouth every 8 (eight) hours. 10/04/17  Yes Michel Bickers, MD  Multiple Vitamin (MULTIVITAMIN WITH MINERALS) TABS tablet Take 1 tablet by mouth daily. 10/08/15  Yes Jule Ser, DO  oxyCODONE (OXY IR/ROXICODONE) 5 MG immediate release tablet Take 1 tablet (5 mg total) by mouth 2 (two) times daily as needed for severe pain. 10/04/17  Yes Michel Bickers, MD  pantoprazole (PROTONIX) 40 MG tablet TAKE 1 TABLET BY MOUTH ONCE DAILY 06/08/17  Yes Valinda Party, DO  phosphorus (K PHOS NEUTRAL) 155-852-130 MG tablet Take 3 tablets (750 mg total) by mouth 2 (two) times daily. 10/08/15  Yes Jule Ser, DO  potassium chloride SA (K-DUR,KLOR-CON) 20 MEQ tablet Take 1 tablet (20 mEq total) by mouth daily. 05/10/17  Yes Hoffman, Jessica Ratliff, DO  potassium citrate (UROCIT-K) 10 MEQ (1080 MG) SR tablet Take 4 tablets (40 mEq total) by mouth 2 (two) times daily with a meal. 09/22/15  Yes Shela Leff, MD  [name] 800-150 MG tablet TAKE 1 TABLET BY MOUTH DAILY. SWALLOW WHOLE. DO NOT CRUSH, BREAK OR CHEW TABLETS. TAKE WITH FOOD. 09/11/17  Yes Michel Bickers, MD  SANTYL ointment APPLY TOPICALLY TO THE AFFECTED AREA(S) DAILY 11/05/15  Yes Iline Oven, MD  sodium bicarbonate 650 MG tablet Take 2 tablets (1,300 mg total) by mouth 3 (three) times daily. 10/08/15  Yes  Jule Ser, DO  Tenofovir Alafenamide Fumarate (VEMLIDY) 25 MG TABS Take 1 tablet (25 mg total) by mouth daily. 12/12/16  Yes Michel Bickers, MD  [name] 50 MG tablet TAKE 1 TABLET BY MOUTH EVERY DAY 09/11/17  Yes Michel Bickers, MD  warfarin (COUMADIN) 5 MG tablet TAKE 1 AND 1/2 TABLETS BY MOUTH MONDAYS THROUGH FRIDAYS, ON SATURDAYS AND SUNDAYS, TAKE ONLY 1 TABLET AS DIRECTED 09/09/17  Yes Hoffman, Elza Rafter, DO   Past Medical History:  Diagnosis Date  . Chronic kidney disease   . History of chicken pox   . History of DVT of lower extremity    right  . HIV (human immunodeficiency virus  infection) ([place])   . Hyperlipidemia   . Left hip pain 03/09/2013  . Pneumonia   . Protein malnutrition ([place]) 08/2015   Social History   Socioeconomic History  . Marital status: Widowed    Spouse name: Not on file  . Number of children: 3  . Years of education: Not on file  . Highest education level: Not on file  Social Needs  . Financial resource strain: Not on file  . Food insecurity - worry: Not on file  . Food insecurity - inability: Not on file  . Transportation needs - medical: Not on file  . Transportation needs - non-medical: Not on file  Occupational History    Employer: UNEMPLOYED  Tobacco Use  . Smoking status: Current Every Day Smoker    Packs/day: 0.50    Years: 36.00    Pack years: 18.00    Types: Cigarettes  . Smokeless tobacco: Never Used  . Tobacco comment: ABOUT 1/2PPD.  Slowing down  Substance and Sexual Activity  . Alcohol use: No    Alcohol/week: 0.0 oz  . Drug use: No  . Sexual activity: Not Currently    Comment: declined condoms  Other Topics Concern  . Not on file  Social History Narrative  . Not on file   Family History  Problem Relation Age of Onset  . Arthritis Mother   . Kidney disease Maternal Grandfather     ASSESSMENT Recent Results: The most recent result is correlated with 47.5 mg per week: Lab Results  Component Value Date   INR 2.90 10/15/2017   INR 2.6 09/10/2017   INR 2.2 07/30/2017   PROTIME 38.4 (H) 04/01/2012    Anticoagulation Dosing: Description   Take 1 & 1/2 tablets of your peach-colored 5mg  strength warfarin tablets Sundays, Tuesdays, Thursdays and Saturdays; on Mondays, Wednesdays and Fridays,  ONLY 1 tablet.      INR today: Therapeutic  PLAN Weekly dose was decreased by 5% to 45 mg per week  Patient Instructions  Patient instructed to take medications as defined in the Anti-coagulation Track section of this encounter.  Patient instructed to  today's dose.  Patient instructed to take 1 & 1/2 tablets of  your peach-colored 5mg  strength warfarin tablets Sundays, Tuesdays, Thursdays and Saturdays; on Mondays, Wednesdays and Fridays,  ONLY 1 tablet.  Patient verbalized understanding of these instructions.    Patient advised to contact clinic or seek medical attention if signs/symptoms of bleeding or thromboembolism occur.  Patient verbalized understanding by repeating back information and was advised to contact me if further medication-related questions arise. Patient was also provided an information handout.  Follow-up Return in 4 weeks (on 11/12/2017) for Follow up INR at 1030h.  [name], PharmD, CACP, CPP  15 minutes spent face-to-face with the patient during the encounter. 50% of  time spent on education. 50% of time was spent on fingerstick point of care INR sample collection, processing, results determination, dose adjustment and documentation in [facility] and [email or web address]

## 2017-10-15 NOTE — Progress Notes (Signed)
Indication: Deep venous thrombosis. Duration: Indefinite per patient preference. INR: At target. Agree with Dr. Gladstone Pih assessment and plan.

## 2017-11-06 ENCOUNTER — Other Ambulatory Visit: Payer: Self-pay | Admitting: *Deleted

## 2017-11-06 DIAGNOSIS — G546 Phantom limb syndrome with pain: Secondary | ICD-10-CM

## 2017-11-06 DIAGNOSIS — G894 Chronic pain syndrome: Secondary | ICD-10-CM

## 2017-11-06 MED ORDER — MORPHINE SULFATE ER 30 MG PO TBCR: 90.0000 mg | EXTENDED_RELEASE_TABLET | Freq: Three times a day (TID) | ORAL | 0 refills | Status: DC

## 2017-11-06 MED ORDER — OXYCODONE HCL 5 MG PO TABS: 5.0000 mg | ORAL_TABLET | Freq: Two times a day (BID) | ORAL | 0 refills | Status: DC | PRN

## 2017-11-12 ENCOUNTER — Ambulatory Visit: Payer: Medicare Other

## 2017-11-22 ENCOUNTER — Other Ambulatory Visit: Payer: Medicare Other

## 2017-11-22 ENCOUNTER — Other Ambulatory Visit: Payer: Self-pay | Admitting: *Deleted

## 2017-11-22 DIAGNOSIS — B20 Human immunodeficiency virus [HIV] disease: Secondary | ICD-10-CM

## 2017-11-22 LAB — COMPLETE METABOLIC PANEL WITH GFR
AG Ratio: 1.1 (calc) (ref 1.0–2.5)
ALT: 15 U/L (ref 9–46)
AST: 20 U/L (ref 10–35)
Albumin: 4.4 g/dL (ref 3.6–5.1)
Alkaline phosphatase (APISO): 190 U/L — ABNORMAL HIGH (ref 40–115)
BILIRUBIN TOTAL: 0.8 mg/dL (ref 0.2–1.2)
BUN/Creatinine Ratio: 7 (calc) (ref 6–22)
BUN: 12 mg/dL (ref 7–25)
CHLORIDE: 98 mmol/L (ref 98–110)
CO2: 27 mmol/L (ref 20–32)
CREATININE: 1.61 mg/dL — AB (ref 0.70–1.33)
Calcium: 9.8 mg/dL (ref 8.6–10.3)
GFR, EST AFRICAN AMERICAN: 55 mL/min/{1.73_m2} — AB (ref >=60)
GFR, Est Non African American: 47 mL/min/{1.73_m2} — ABNORMAL LOW (ref >=60)
GLUCOSE: 164 mg/dL — AB (ref 65–99)
Globulin: 4 g/dL (calc) — ABNORMAL HIGH (ref 1.9–3.7)
Potassium: 3.8 mmol/L (ref 3.5–5.3)
Sodium: 138 mmol/L (ref 135–146)
Total Protein: 8.4 g/dL — ABNORMAL HIGH (ref 6.1–8.1)

## 2017-11-22 LAB — CBC WITH DIFFERENTIAL/PLATELET
BASOS ABS: 41 {cells}/uL (ref 0–200)
Basophils Relative: 0.5 %
EOS PCT: 1 %
Eosinophils Absolute: 82 cells/uL (ref 15–500)
HEMATOCRIT: 51.6 % — AB (ref 38.5–50.0)
HEMOGLOBIN: 17.5 g/dL — AB (ref 13.2–17.1)
LYMPHS ABS: 4289 {cells}/uL — AB (ref 850–3900)
MCH: 29.9 pg (ref 27.0–33.0)
MCHC: 33.9 g/dL (ref 32.0–36.0)
MCV: 88.1 fL (ref 80.0–100.0)
MPV: 9.9 fL (ref 7.5–12.5)
Monocytes Relative: 8.4 %
NEUTROS ABS: 3100 {cells}/uL (ref 1500–7800)
Neutrophils Relative %: 37.8 %
Platelets: 279 10*3/uL (ref 140–400)
RBC: 5.86 10*6/uL — AB (ref 4.20–5.80)
RDW: 13.9 % (ref 11.0–15.0)
Total Lymphocyte: 52.3 %
WBC: 8.2 10*3/uL (ref 3.8–10.8)
WBCMIX: 689 {cells}/uL (ref 200–950)

## 2017-11-23 LAB — T-HELPER CELL (CD4) - (RCID CLINIC ONLY)
CD4 T CELL HELPER: 17 % — AB (ref 33–55)
CD4 T Cell Abs: 800 /uL (ref 400–2700)

## 2017-11-26 ENCOUNTER — Ambulatory Visit (INDEPENDENT_AMBULATORY_CARE_PROVIDER_SITE_OTHER): Payer: Medicare Other | Admitting: Pharmacy Technician

## 2017-11-26 DIAGNOSIS — Z5181 Encounter for therapeutic drug level monitoring: Secondary | ICD-10-CM

## 2017-11-26 DIAGNOSIS — Z86718 Personal history of other venous thrombosis and embolism: Secondary | ICD-10-CM

## 2017-11-26 DIAGNOSIS — Z7901 Long term (current) use of anticoagulants: Secondary | ICD-10-CM

## 2017-11-26 LAB — POCT INR: INR: 2.4

## 2017-11-26 MED ORDER — WARFARIN SODIUM 5 MG PO TABS: ORAL_TABLET | ORAL | 0 refills | Status: DC

## 2017-11-26 NOTE — Progress Notes (Signed)
INTERNAL MEDICINE TEACHING ATTENDING ADDENDUM - Matthew Groves, DO Duration- indefinite per patient preference, Indication- dvt, INR-  Lab Results  Component Value Date   INR 2.4 11/26/2017   PROTIME 38.4 (H) 04/01/2012  . Agree with pharmacy recommendations as outlined in their note.

## 2017-11-26 NOTE — Progress Notes (Signed)
Anticoagulation Management [name] is a 57 y.o. male who reports to the clinic for monitoring of warfarin treatment.    Indication: History of DVT, long term use of anticoagulant  Duration: indefinite Supervising physician: Joni Reining  Anticoagulation Clinic Visit History: Patient does not report signs/symptoms of bleeding or thromboembolism  Other recent changes: No diet, medications, lifestyle changes endorsed at this visit. Anticoagulation Episode Summary    Current INR goal:   2.0-3.0  TTR:   68.0 % (6.8 y)  Next INR check:   12/17/2017  INR from last check:   2.4 (11/26/2017)  Weekly max warfarin dose:     Target end date:   Indefinite  IN[facility] check location:   [facility]  Preferred lab:     Send INR reminders to:   [facility]   Indications   History of DVT (deep vein thrombosis) [Z86.718] Long term current use of anticoagulant [Z79.01]       Comments:         Anticoagulation Care Providers    Provider Role Specialty Phone number   Michel Bickers, MD  Infectious Diseases [phone]      Allergies  Allergen Reactions  . Dapsone     REACTION: fever  . Megestrol   . Sulfamethoxazole-Trimethoprim     REACTION: fever   Prior to Admission medications   Medication Sig Start Date End Date Taking? Authorizing Provider  acetaminophen (TYLENOL) 500 MG tablet Take 1 tablet (500 mg total) by mouth every 4 (four) hours as needed for mild pain, moderate pain or headache. 09/22/15  Yes Shela Leff, MD  ENSURE (ENSURE) Take 1 Can by mouth 4 (four) times daily - after meals and at bedtime. 05/08/17  Yes Michel Bickers, MD  gabapentin (NEURONTIN) 400 MG capsule TAKE 1 CAPSULE(400 MG) BY MOUTH FOUR TIMES DAILY 09/07/17  Yes Michel Bickers, MD  magnesium chloride (SLOW-MAG) 64 MG TBEC SR tablet Take 1 tablet (64 mg total) by mouth daily. 10/08/15  Yes Jule Ser, DO  morphine (MS CONTIN) 30 MG 12 hr tablet Take 3 tablets (90 mg total) by mouth every 8 (eight)  hours. 11/06/17  Yes Tommy Medal, Lavell Islam, MD  Multiple Vitamin (MULTIVITAMIN WITH MINERALS) TABS tablet Take 1 tablet by mouth daily. 10/08/15  Yes Jule Ser, DO  oxyCODONE (OXY IR/ROXICODONE) 5 MG immediate release tablet Take 1 tablet (5 mg total) by mouth 2 (two) times daily as needed for severe pain. 11/06/17  Yes Tommy Medal, Lavell Islam, MD  pantoprazole (PROTONIX) 40 MG tablet TAKE 1 TABLET BY MOUTH ONCE DAILY 06/08/17  Yes Valinda Party, DO  phosphorus (K PHOS NEUTRAL) 155-852-130 MG tablet Take 3 tablets (750 mg total) by mouth 2 (two) times daily. 10/08/15  Yes Jule Ser, DO  potassium chloride SA (K-DUR,KLOR-CON) 20 MEQ tablet Take 1 tablet (20 mEq total) by mouth daily. 05/10/17  Yes Hoffman, Jessica Ratliff, DO  potassium citrate (UROCIT-K) 10 MEQ (1080 MG) SR tablet Take 4 tablets (40 mEq total) by mouth 2 (two) times daily with a meal. 09/22/15  Yes Shela Leff, MD  [name] 800-150 MG tablet TAKE 1 TABLET BY MOUTH DAILY. SWALLOW WHOLE. DO NOT CRUSH, BREAK OR CHEW TABLETS. TAKE WITH FOOD. 09/11/17  Yes Michel Bickers, MD  SANTYL ointment APPLY TOPICALLY TO THE AFFECTED AREA(S) DAILY 11/05/15  Yes Iline Oven, MD  sodium bicarbonate 650 MG tablet Take 2 tablets (1,300 mg total) by mouth 3 (three) times daily. 10/08/15  Yes Jule Ser, DO  Tenofovir  Alafenamide Fumarate (VEMLIDY) 25 MG TABS Take 1 tablet (25 mg total) by mouth daily. 12/12/16  Yes Michel Bickers, MD  [name] 50 MG tablet TAKE 1 TABLET BY MOUTH EVERY DAY 09/11/17  Yes Michel Bickers, MD  warfarin (COUMADIN) 5 MG tablet TAKE 1 AND 1/2 TABLETS BY MOUTH MONDAYS THROUGH FRIDAYS, ON SATURDAYS AND SUNDAYS, TAKE ONLY 1 TABLET AS DIRECTED 09/09/17  Yes Hoffman, Elza Rafter, DO   Past Medical History:  Diagnosis Date  . Chronic kidney disease   . History of chicken pox   . History of DVT of lower extremity    right  . HIV (human immunodeficiency virus infection) ([place])   . Hyperlipidemia   .  Left hip pain 03/09/2013  . Pneumonia   . Protein malnutrition ([place]) 08/2015   Social History   Socioeconomic History  . Marital status: Widowed    Spouse name: Not on file  . Number of children: 3  . Years of education: Not on file  . Highest education level: Not on file  Social Needs  . Financial resource strain: Not on file  . Food insecurity - worry: Not on file  . Food insecurity - inability: Not on file  . Transportation needs - medical: Not on file  . Transportation needs - non-medical: Not on file  Occupational History    Employer: UNEMPLOYED  Tobacco Use  . Smoking status: Current Every Day Smoker    Packs/day: 0.50    Years: 36.00    Pack years: 18.00    Types: Cigarettes  . Smokeless tobacco: Never Used  . Tobacco comment: ABOUT 1/2PPD.  Slowing down  Substance and Sexual Activity  . Alcohol use: No    Alcohol/week: 0.0 oz  . Drug use: No  . Sexual activity: Not Currently    Comment: declined condoms  Other Topics Concern  . Not on file  Social History Narrative  . Not on file   Family History  Problem Relation Age of Onset  . Arthritis Mother   . Kidney disease Maternal Grandfather     ASSESSMENT Recent Results: The most recent result is correlated with 45 mg per week: Lab Results  Component Value Date   INR 2.4 11/26/2017   INR 2.90 10/15/2017   INR 2.6 09/10/2017   PROTIME 38.4 (H) 04/01/2012    Anticoagulation Dosing: Description   Take 1 & 1/2 tablets of your peach-colored 5mg  strength warfarin tablets Sundays, Tuesdays, Thursdays and Saturdays; on Mondays, Wednesdays and Fridays,  ONLY 1 tablet.      INR today: Therapeutic  PLAN Weekly dose was unchanged  Patient Instructions  Patient instructed to take medications as defined in the Anti-coagulation Track section of this encounter.  Patient instructed to take today's dose.  Patient instructed to take 1 & 1/2 tablets of your peach-colored 5mg  strength warfarin tablets Sundays,  Tuesdays, Thursdays and Saturdays; on Mondays, Wednesdays and Fridays,  ONLY 1 tablet.  Patient verbalized understanding of these instructions.     Patient advised to contact clinic or seek medical attention if signs/symptoms of bleeding or thromboembolism occur.  Patient verbalized understanding by repeating back information and was advised to contact me if further medication-related questions arise. Patient was also provided an information handout.  Follow-up Return in about 3 weeks (around 12/17/2017) for INR follow up at 1000.  Carnella [place]  15 minutes spent face-to-face with the patient during the encounter. 50% of time spent on education. 50% of time was spent on INR point of  care testing, analysis, and documentation in EMR.

## 2017-11-26 NOTE — Patient Instructions (Signed)
Patient instructed to take medications as defined in the Anti-coagulation Track section of this encounter.  Patient instructed to take today's dose.  Patient instructed to take 1 & 1/2 tablets of your peach-colored 5mg strength warfarin tablets Sundays, Tuesdays, Thursdays and Saturdays; on Mondays, Wednesdays and Fridays,  ONLY 1 tablet.  Patient verbalized understanding of these instructions.   

## 2017-11-26 NOTE — Addendum Note (Signed)
Addended by: Jorene Guest B on: 11/26/2017 03:40 PM   Modules accepted: Orders

## 2017-11-27 LAB — HIV-1 RNA QUANT-NO REFLEX-BLD
HIV 1 RNA Quant: 50 copies/mL — ABNORMAL HIGH
HIV-1 RNA Quant, Log: 1.7 Log copies/mL — ABNORMAL HIGH

## 2017-12-05 ENCOUNTER — Telehealth: Payer: Self-pay | Admitting: Behavioral Health

## 2017-12-05 NOTE — Telephone Encounter (Signed)
Received fax from [facility]) stating that prior authorization needed to be initiated on Vemlidy 25mg  tab daily.  Called [employer] and was able to get the representative to help initiate Prior authorization over the phone after answering several questions.  Prior authorization is currently pending and rep states that a fax will be sent within the next 24-72 hours with denial or approval.    Reference #[id number]

## 2017-12-06 ENCOUNTER — Other Ambulatory Visit: Payer: Self-pay | Admitting: Behavioral Health

## 2017-12-06 ENCOUNTER — Ambulatory Visit (INDEPENDENT_AMBULATORY_CARE_PROVIDER_SITE_OTHER): Payer: Medicare Other | Admitting: Internal Medicine

## 2017-12-06 ENCOUNTER — Encounter: Payer: Self-pay | Admitting: Internal Medicine

## 2017-12-06 ENCOUNTER — Other Ambulatory Visit: Payer: Self-pay

## 2017-12-06 DIAGNOSIS — B20 Human immunodeficiency virus [HIV] disease: Secondary | ICD-10-CM | POA: Diagnosis not present

## 2017-12-06 DIAGNOSIS — F17200 Nicotine dependence, unspecified, uncomplicated: Secondary | ICD-10-CM

## 2017-12-06 DIAGNOSIS — Z23 Encounter for immunization: Secondary | ICD-10-CM

## 2017-12-06 DIAGNOSIS — G546 Phantom limb syndrome with pain: Secondary | ICD-10-CM

## 2017-12-06 DIAGNOSIS — G894 Chronic pain syndrome: Secondary | ICD-10-CM

## 2017-12-06 MED ORDER — DOLUTEGRAVIR SODIUM 50 MG PO TABS: 50.0000 mg | ORAL_TABLET | Freq: Every day | ORAL | 2 refills | Status: DC

## 2017-12-06 MED ORDER — OXYCODONE HCL 5 MG PO TABS: 5.0000 mg | ORAL_TABLET | Freq: Two times a day (BID) | ORAL | 0 refills | Status: DC | PRN

## 2017-12-06 MED ORDER — MORPHINE SULFATE ER 30 MG PO TBCR: 90.0000 mg | EXTENDED_RELEASE_TABLET | Freq: Three times a day (TID) | ORAL | 0 refills | Status: DC

## 2017-12-06 MED ORDER — DARUNAVIR-COBICISTAT 800-150 MG PO TABS: 1.0000 | ORAL_TABLET | Freq: Every day | ORAL | 2 refills | Status: DC

## 2017-12-06 NOTE — Assessment & Plan Note (Signed)
His infection remains under very good, long-term control.  He will continue his current regimen and follow-up after lab work in 6 months.

## 2017-12-06 NOTE — Progress Notes (Signed)
Patient Active Problem List   Diagnosis Date Noted  . Phantom limb syndrome with pain ([place]) 03/21/2012    Priority: High  . Long term current use of anticoagulant 11/20/2010    Priority: High  . History of DVT (deep vein thrombosis) 07/19/2009    Priority: High  . Human immunodeficiency virus (HIV) disease ([place]) 11/10/2006    Priority: High  . DYSLIPIDEMIA 11/10/2006    Priority: High  . CIGARETTE SMOKER 11/10/2006    Priority: High  . Prosthesis adjustment 07/20/2017  . Long term current use of opiate analgesic 12/17/2015  . Pressure ulcer 09/13/2015  . Protein-calorie malnutrition, severe 09/13/2015  . Avascular necrosis of femoral head ([place]) 05/14/2015  . Left adrenal mass ([place]) 05/14/2015  . Preventative health care 03/09/2013  . ERECTILE DYSFUNCTION, ORGANIC 04/12/2010  . GERD 08/23/2009  . HYPOGONADISM 07/15/2009  . DEFICIENCY OF OTHER VITAMINS 07/15/2009  . Pulmonary nodule 07/15/2009  . Status post above knee amputation ([place]) 07/15/2009  . SHINGLES, RECURRENT 11/10/2006  . Alcohol abuse 11/10/2006  . PERIPHERAL NEUROPATHY 11/10/2006  . ALLERGIC RHINITIS 11/10/2006    Patient's Medications  New Prescriptions   No medications on file  Previous Medications   ACETAMINOPHEN (TYLENOL) 500 MG TABLET    Take 1 tablet (500 mg total) by mouth every 4 (four) hours as needed for mild pain, moderate pain or headache.   DARUNAVIR-COBICISTAT ([name]) 800-150 MG TABLET    Take 1 tablet by mouth daily. Swallow whole. Do NOT crush, break or chew tablets. Take with food.   DOLUTEGRAVIR ([name]) 50 MG TABLET    Take 1 tablet (50 mg total) by mouth daily.   ENSURE (ENSURE)    Take 1 Can by mouth 4 (four) times daily - after meals and at bedtime.   GABAPENTIN (NEURONTIN) 400 MG CAPSULE    TAKE 1 CAPSULE(400 MG) BY MOUTH FOUR TIMES DAILY   MAGNESIUM CHLORIDE (SLOW-MAG) 64 MG TBEC SR TABLET    Take 1 tablet (64 mg total) by mouth daily.   MORPHINE (MS CONTIN) 30 MG 12  HR TABLET    Take 3 tablets (90 mg total) by mouth every 8 (eight) hours.   MULTIPLE VITAMIN (MULTIVITAMIN WITH MINERALS) TABS TABLET    Take 1 tablet by mouth daily.   OXYCODONE (OXY IR/ROXICODONE) 5 MG IMMEDIATE RELEASE TABLET    Take 1 tablet (5 mg total) by mouth 2 (two) times daily as needed for severe pain.   PANTOPRAZOLE (PROTONIX) 40 MG TABLET    TAKE 1 TABLET BY MOUTH ONCE DAILY   PHOSPHORUS (K PHOS NEUTRAL) 155-852-130 MG TABLET    Take 3 tablets (750 mg total) by mouth 2 (two) times daily.   POTASSIUM CHLORIDE SA (K-DUR,KLOR-CON) 20 MEQ TABLET    Take 1 tablet (20 mEq total) by mouth daily.   POTASSIUM CITRATE (UROCIT-K) 10 MEQ (1080 MG) SR TABLET    Take 4 tablets (40 mEq total) by mouth 2 (two) times daily with a meal.   SANTYL OINTMENT    APPLY TOPICALLY TO THE AFFECTED AREA(S) DAILY   SODIUM BICARBONATE 650 MG TABLET    Take 2 tablets (1,300 mg total) by mouth 3 (three) times daily.   TENOFOVIR ALAFENAMIDE FUMARATE (VEMLIDY) 25 MG TABS    Take 1 tablet (25 mg total) by mouth daily.   WARFARIN (COUMADIN) 5 MG TABLET    Take 1&1/2 tablets on Sundays, Tuesdays, Thursdays and Saturdays; all other days--take only one (1)  tablet.  Modified Medications   No medications on file  Discontinued Medications   No medications on file    Subjective: [name] is in for his routine HIV follow-up visit.  He has had no problems obtaining, taking or tolerating his Vemlidy, [name] or [name].  He does not recall missing doses.  His chronic pain is under good control with his current regimen.  He continues to smoke 1 pack of cigarettes daily and has no current plan to quit.  The fuel pump in his truck broke recently so he has no consistent transportation.  He does not get out of the house much and gets very little physical exercise.  Review of Systems: Review of Systems  Constitutional: Negative for chills, diaphoresis, fever, malaise/fatigue and weight loss.  HENT: Negative for sore throat.     Respiratory: Negative for cough, sputum production and shortness of breath.   Cardiovascular: Negative for chest pain.  Gastrointestinal: Negative for abdominal pain, diarrhea, heartburn, nausea and vomiting.  Genitourinary: Negative for dysuria and frequency.  Musculoskeletal: Positive for joint pain. Negative for myalgias.  Skin: Negative for rash.  Neurological: Negative for dizziness and headaches.  Psychiatric/Behavioral: Negative for depression and substance abuse. The patient is not nervous/anxious.     Past Medical History:  Diagnosis Date  . Chronic kidney disease   . History of chicken pox   . History of DVT of lower extremity    right  . HIV (human immunodeficiency virus infection) ([place])   . Hyperlipidemia   . Left hip pain 03/09/2013  . Pneumonia   . Protein malnutrition ([place]) 08/2015    Social History   Tobacco Use  . Smoking status: Current Every Day Smoker    Packs/day: 0.50    Years: 36.00    Pack years: 18.00    Types: Cigarettes  . Smokeless tobacco: Never Used  . Tobacco comment: ABOUT 1/2PPD.  Slowing down  Substance Use Topics  . Alcohol use: No    Alcohol/week: 0.0 oz  . Drug use: No    Family History  Problem Relation Age of Onset  . Arthritis Mother   . Kidney disease Maternal Grandfather     Allergies  Allergen Reactions  . Dapsone     REACTION: fever  . Megestrol   . Sulfamethoxazole-Trimethoprim     REACTION: fever    Health Maintenance  Topic Date Due  . COLON CANCER SCREENING ANNUAL FOBT  03/30/2016  . INFLUENZA VACCINE  05/30/2017  . TETANUS/TDAP  10/08/2024  . Hepatitis C Screening  Completed  . HIV Screening  Completed    Objective:  Vitals:   12/06/17 0932  BP: 122/62  Pulse: (!) 109  Temp: 98.7 F (37.1 C)  TempSrc: Oral  Weight: 134 lb 12 oz (61.1 kg)  Height: 5\' 11"  (1.803 m)   Body mass index is 18.79 kg/m.  Physical Exam  Constitutional: He is oriented to person, place, and time.  He is pleasant and  in no distress.  He is seated in his wheelchair.  He has gained 17 pounds in the past year.  HENT:  Mouth/Throat: No oropharyngeal exudate.  Eyes: Conjunctivae are normal.  Cardiovascular: Normal rate and regular rhythm.  No murmur heard. Pulmonary/Chest: Effort normal and breath sounds normal. He has no wheezes. He has no rales.  Abdominal: Soft. He exhibits no mass. There is no tenderness.  Musculoskeletal: Normal range of motion.  He has a right leg prosthesis.  Neurological: He is alert and oriented to person,  place, and time.  Skin: No rash noted.  Psychiatric: Mood and affect normal.    Lab Results Lab Results  Component Value Date   WBC 8.2 11/22/2017   HGB 17.5 (H) 11/22/2017   HCT 51.6 (H) 11/22/2017   MCV 88.1 11/22/2017   PLT 279 11/22/2017    Lab Results  Component Value Date   CREATININE 1.61 (H) 11/22/2017   BUN 12 11/22/2017   NA 138 11/22/2017   K 3.8 11/22/2017   CL 98 11/22/2017   CO2 27 11/22/2017    Lab Results  Component Value Date   ALT 15 11/22/2017   AST 20 11/22/2017   ALKPHOS 408 (H) 05/22/2017   BILITOT 0.8 11/22/2017    Lab Results  Component Value Date   CHOL 211 (H) 05/16/2016   HDL 40 05/16/2016   LDLCALC 133 (H) 05/16/2016   TRIG 189 (H) 05/16/2016   CHOLHDL 5.3 (H) 05/16/2016   Lab Results  Component Value Date   LABRPR NON REAC 05/22/2017   HIV 1 RNA Quant (copies/mL)  Date Value  11/22/2017 50 (H)  05/22/2017 <20 NOT DETECTED  12/12/2016 44 (H)   CD4 T Cell Abs (/uL)  Date Value  11/22/2017 800  05/22/2017 740  12/12/2016 920     Problem List Items Addressed This Visit      High   CIGARETTE SMOKER    At present his ongoing cigarette smoking poses the greatest risk to his health.  He does not believe and has no current desire to try to quit.      Human immunodeficiency virus (HIV) disease ([place])    His infection remains under very good, long-term control.  He will continue his current regimen and follow-up after  lab work in 6 months.      Relevant Orders   T-helper cell (CD4)- ([facility] clinic only)   HIV 1 RNA quant-no reflex-bld   Phantom limb syndrome with pain ([place])    His chronic pain is under good control with his current regimen.           Michel Bickers, MD [facility] [phone] pager   [phone] cell 12/06/2017, 9:59 AM

## 2017-12-06 NOTE — Assessment & Plan Note (Signed)
At present his ongoing cigarette smoking poses the greatest risk to his health.  He does not believe and has no current desire to try to quit.

## 2017-12-06 NOTE — Telephone Encounter (Signed)
PA was approved notified the pharmacy. Pharmacist stated that the pt currently is out of refills for [name], Descovy, and Prezocibix. Will send over a refill electronically for the pharmacy.  [place]

## 2017-12-06 NOTE — Assessment & Plan Note (Signed)
His chronic pain is under good control with his current regimen.

## 2017-12-07 ENCOUNTER — Encounter: Payer: Self-pay | Admitting: *Deleted

## 2017-12-07 ENCOUNTER — Other Ambulatory Visit: Payer: Self-pay | Admitting: Internal Medicine

## 2017-12-07 DIAGNOSIS — G629 Polyneuropathy, unspecified: Secondary | ICD-10-CM

## 2017-12-07 NOTE — Progress Notes (Signed)
IFOBT kit sent by mail per Dr. Lynnae January.  VBarrow 12/07/17

## 2017-12-11 ENCOUNTER — Telehealth: Payer: Self-pay | Admitting: *Deleted

## 2017-12-11 NOTE — Telephone Encounter (Signed)
Received approval, although it was only  For #177 per [facility].  RN called [employer] again, spoke with Ovid Curd B, got the approval for #270/30 days. Notified [facility], verified it went through for #270. Pharmacy will call patient today.

## 2017-12-11 NOTE — Telephone Encounter (Signed)
Patient called, stated he was unable to fill his morphine 30mg  #270, as his insurance rejected it without prior authorization.   RN called, initiated an urgent PA request, [id number]. We should have a determination faxed within 24 hours. Patient has enough medication for today only. [name], RN

## 2017-12-13 ENCOUNTER — Other Ambulatory Visit: Payer: Self-pay | Admitting: Internal Medicine

## 2017-12-13 DIAGNOSIS — B20 Human immunodeficiency virus [HIV] disease: Secondary | ICD-10-CM

## 2017-12-17 ENCOUNTER — Ambulatory Visit (INDEPENDENT_AMBULATORY_CARE_PROVIDER_SITE_OTHER): Payer: Medicare Other | Admitting: Pharmacist

## 2017-12-17 DIAGNOSIS — Z86718 Personal history of other venous thrombosis and embolism: Secondary | ICD-10-CM | POA: Diagnosis not present

## 2017-12-17 DIAGNOSIS — Z7901 Long term (current) use of anticoagulants: Secondary | ICD-10-CM | POA: Diagnosis present

## 2017-12-17 LAB — POCT INR: INR: 2.8

## 2017-12-17 NOTE — Patient Instructions (Signed)
Patient instructed to take medications as defined in the Anti-coagulation Track section of this encounter.  Patient instructed to take today's dose.  Patient instructed to take 1 & 1/2 tablets of your peach-colored 5mg strength warfarin tablets Sundays, Tuesdays, Thursdays and Saturdays; on Mondays, Wednesdays and Fridays,  ONLY 1 tablet.  Patient verbalized understanding of these instructions.   

## 2017-12-17 NOTE — Progress Notes (Signed)
Anticoagulation Management [name] is a 57 y.o. male who reports to the clinic for monitoring of warfarin treatment.    Indication: DVT  Duration: indefinite Supervising physician: Aldine Contes  Anticoagulation Clinic Visit History: Patient does not report signs/symptoms of bleeding or thromboembolism  Other recent changes: No diet, medications, lifestyle changes. Anticoagulation Episode Summary    Current INR goal:   2.0-3.0  TTR:   68.2 % (6.9 y)  Next INR check:   01/14/2018  INR from last check:   2.80 (12/17/2017)  Weekly max warfarin dose:     Target end date:   Indefinite  INR check [facility]   [facility]  Preferred lab:     Send INR reminders to:   ANT[facility]AG IMP   Indications   History of DVT (deep vein thrombosis) [Z86.718] Long term current use of anticoagulant [Z79.01]       Comments:         Anticoagulation Care Providers    Provider Role Specialty Phone number   Michel Bickers, MD  Infectious Diseases [phone]      Allergies  Allergen Reactions  . Dapsone     REACTION: fever  . Megestrol   . Sulfamethoxazole-Trimethoprim     REACTION: fever   Prior to Admission medications   Medication Sig Start Date End Date Taking? Authorizing Provider  acetaminophen (TYLENOL) 500 MG tablet Take 1 tablet (500 mg total) by mouth every 4 (four) hours as needed for mild pain, moderate pain or headache. 09/22/15  Yes Shela Leff, MD  darunavir-cobicistat ([name]) 800-150 MG tablet Take 1 tablet by mouth daily. Swallow whole. Do NOT crush, break or chew tablets. Take with food. 12/06/17  Yes Michel Bickers, MD  dolutegravir ([name]) 50 MG tablet Take 1 tablet (50 mg total) by mouth daily. 12/06/17  Yes Michel Bickers, MD  ENSURE (ENSURE) Take 1 Can by mouth 4 (four) times daily - after meals and at bedtime. 05/08/17  Yes Michel Bickers, MD  gabapentin (NEURONTIN) 400 MG capsule TAKE 1 CAPSULE(400 MG) BY MOUTH FOUR TIMES DAILY 12/07/17  Yes Michel Bickers,  MD  magnesium chloride (SLOW-MAG) 64 MG TBEC SR tablet Take 1 tablet (64 mg total) by mouth daily. 10/08/15  Yes Jule Ser, DO  morphine (MS CONTIN) 30 MG 12 hr tablet Take 3 tablets (90 mg total) by mouth every 8 (eight) hours. 12/06/17  Yes Michel Bickers, MD  Multiple Vitamin (MULTIVITAMIN WITH MINERALS) TABS tablet Take 1 tablet by mouth daily. 10/08/15  Yes Jule Ser, DO  oxyCODONE (OXY IR/ROXICODONE) 5 MG immediate release tablet Take 1 tablet (5 mg total) by mouth 2 (two) times daily as needed for severe pain. 12/06/17  Yes Michel Bickers, MD  pantoprazole (PROTONIX) 40 MG tablet TAKE 1 TABLET BY MOUTH ONCE DAILY 06/08/17  Yes Valinda Party, DO  phosphorus (K PHOS NEUTRAL) 155-852-130 MG tablet Take 3 tablets (750 mg total) by mouth 2 (two) times daily. 10/08/15  Yes Jule Ser, DO  potassium chloride SA (K-DUR,KLOR-CON) 20 MEQ tablet Take 1 tablet (20 mEq total) by mouth daily. 05/10/17  Yes Hoffman, Jessica Ratliff, DO  potassium citrate (UROCIT-K) 10 MEQ (1080 MG) SR tablet Take 4 tablets (40 mEq total) by mouth 2 (two) times daily with a meal. 09/22/15  Yes Shela Leff, MD  SANTYL ointment APPLY TOPICALLY TO THE AFFECTED AREA(S) DAILY 11/05/15  Yes Iline Oven, MD  sodium bicarbonate 650 MG tablet Take 2 tablets (1,300 mg total) by mouth 3 (three) times daily. 10/08/15  Yes Jule Ser, DO  VEMLIDY 25 MG TABS TAKE 1 TABLET BY MOUTH DAILY 12/13/17  Yes Michel Bickers, MD  warfarin (COUMADIN) 5 MG tablet Take 1&1/2 tablets on Sundays, Tuesdays, Thursdays and Saturdays; all other days--take only one (1) tablet. 11/26/17  Yes Lucious Groves, DO   Past Medical History:  Diagnosis Date  . Chronic kidney disease   . History of chicken pox   . History of DVT of lower extremity    right  . HIV (human immunodeficiency virus infection) ([place])   . Hyperlipidemia   . Left hip pain 03/09/2013  . Pneumonia   . Protein malnutrition ([place]) 08/2015   Social History    Socioeconomic History  . Marital status: Widowed    Spouse name: Not on file  . Number of children: 3  . Years of education: Not on file  . Highest education level: Not on file  Social Needs  . Financial resource strain: Not on file  . Food insecurity - worry: Not on file  . Food insecurity - inability: Not on file  . Transportation needs - medical: Not on file  . Transportation needs - non-medical: Not on file  Occupational History    Employer: UNEMPLOYED  Tobacco Use  . Smoking status: Current Every Day Smoker    Packs/day: 0.50    Years: 36.00    Pack years: 18.00    Types: Cigarettes  . Smokeless tobacco: Never Used  . Tobacco comment: ABOUT 1/2PPD.  Slowing down  Substance and Sexual Activity  . Alcohol use: No    Alcohol/week: 0.0 oz  . Drug use: No  . Sexual activity: Not Currently    Comment: declined condoms  Other Topics Concern  . Not on file  Social History Narrative  . Not on file   Family History  Problem Relation Age of Onset  . Arthritis Mother   . Kidney disease Maternal Grandfather     ASSESSMENT Recent Results: The most recent result is correlated with 45 mg per week: Lab Results  Component Value Date   INR 2.80 12/17/2017   INR 2.4 11/26/2017   INR 2.90 10/15/2017   PROTIME 38.4 (H) 04/01/2012    Anticoagulation Dosing: Description   Take 1 & 1/2 tablets of your peach-colored 5mg  strength warfarin tablets Sundays, Tuesdays, Thursdays and Saturdays; on Mondays, Wednesdays and Fridays,  ONLY 1 tablet.      INR today: Therapeutic  PLAN Weekly dose was unchanged.  Patient Instructions  Patient instructed to take medications as defined in the Anti-coagulation Track section of this encounter.  Patient instructed to take today's dose.  Patient instructed to take 1 & 1/2 tablets of your peach-colored 5mg  strength warfarin tablets Sundays, Tuesdays, Thursdays and Saturdays; on Mondays, Wednesdays and Fridays,  ONLY 1 tablet.  Patient  verbalized understanding of these instructions.     Patient advised to contact clinic or seek medical attention if signs/symptoms of bleeding or thromboembolism occur.  Patient verbalized understanding by repeating back information and was advised to contact me if further medication-related questions arise. Patient was also provided an information handout.  Follow-up Return in 4 weeks (on 01/14/2018) for Follow up INR at 1015h.  [name], PharmD, CACP, CPP  15 minutes spent face-to-face with the patient during the encounter. 50% of time spent on education. 50% of time was spent on fingerstick point of care INR sample collection, processing, results determination and documentation in [email or web address].

## 2017-12-18 NOTE — Progress Notes (Signed)
INTERNAL MEDICINE TEACHING ATTENDING ADDENDUM - Aldine Contes M.D  Duration-indefinite, Indication-right lower extremity DVT, INR-therapeutic. Agree with pharmacy recommendations as outlined in their note.

## 2017-12-20 DIAGNOSIS — Z1211 Encounter for screening for malignant neoplasm of colon: Secondary | ICD-10-CM | POA: Diagnosis not present

## 2017-12-24 ENCOUNTER — Other Ambulatory Visit: Payer: Medicare Other

## 2017-12-24 DIAGNOSIS — Z1211 Encounter for screening for malignant neoplasm of colon: Secondary | ICD-10-CM

## 2017-12-26 LAB — FECAL OCCULT BLOOD, IMMUNOCHEMICAL: FECAL OCCULT BLD: NEGATIVE

## 2018-01-07 ENCOUNTER — Other Ambulatory Visit: Payer: Self-pay | Admitting: *Deleted

## 2018-01-07 DIAGNOSIS — G546 Phantom limb syndrome with pain: Secondary | ICD-10-CM

## 2018-01-07 DIAGNOSIS — G894 Chronic pain syndrome: Secondary | ICD-10-CM

## 2018-01-07 MED ORDER — MORPHINE SULFATE ER 30 MG PO TBCR: 90.0000 mg | EXTENDED_RELEASE_TABLET | Freq: Three times a day (TID) | ORAL | 0 refills | Status: DC

## 2018-01-07 MED ORDER — OXYCODONE HCL 5 MG PO TABS: 5.0000 mg | ORAL_TABLET | Freq: Two times a day (BID) | ORAL | 0 refills | Status: DC | PRN

## 2018-01-14 ENCOUNTER — Encounter (INDEPENDENT_AMBULATORY_CARE_PROVIDER_SITE_OTHER): Payer: Self-pay

## 2018-01-14 ENCOUNTER — Ambulatory Visit (INDEPENDENT_AMBULATORY_CARE_PROVIDER_SITE_OTHER): Payer: Medicare Other | Admitting: Pharmacist

## 2018-01-14 DIAGNOSIS — Z86718 Personal history of other venous thrombosis and embolism: Secondary | ICD-10-CM | POA: Diagnosis not present

## 2018-01-14 DIAGNOSIS — Z5181 Encounter for therapeutic drug level monitoring: Secondary | ICD-10-CM

## 2018-01-14 DIAGNOSIS — Z7901 Long term (current) use of anticoagulants: Secondary | ICD-10-CM | POA: Diagnosis not present

## 2018-01-14 LAB — POCT INR: INR: 2.7

## 2018-01-14 NOTE — Patient Instructions (Signed)
Patient instructed to take medications as defined in the Anti-coagulation Track section of this encounter.  Patient instructed to take today's dose.  Patient instructed to take 1 & 1/2 tablets of your peach-colored 5mg strength warfarin tablets Sundays, Tuesdays, Thursdays and Saturdays; on Mondays, Wednesdays and Fridays,  ONLY 1 tablet.  Patient verbalized understanding of these instructions.   

## 2018-01-14 NOTE — Progress Notes (Signed)
Anticoagulation Management [name] is a 57 y.o. male who reports to the clinic for monitoring of warfarin treatment.    Indication: DVT  Duration: indefinite Supervising physician: Aldine Contes  Anticoagulation Clinic Visit History: Patient does not report signs/symptoms of bleeding or thromboembolism  Other recent changes: No changes reported in diet, medications, lifestyle Anticoagulation Episode Summary    Current INR goal:   2.0-3.0  TTR:   68.6 % (7 y)  Next INR check:   02/25/2018  INR from last check:   2.7 (01/14/2018)  Weekly max warfarin dose:     Target end date:   Indefinite  INR check location:   [facility]  Preferred lab:     Send INR reminders to:   [facility]   Indications   History of DVT (deep vein thrombosis) [Z86.718] Long term current use of anticoagulant [Z79.01]       Comments:         Anticoagulation Care Providers    Provider Role Specialty Phone number   Michel Bickers, MD  Infectious Diseases [phone]      Allergies  Allergen Reactions  . Dapsone     REACTION: fever  . Megestrol   . Sulfamethoxazole-Trimethoprim     REACTION: fever   Prior to Admission medications   Medication Sig Start Date End Date Taking? Authorizing Provider  acetaminophen (TYLENOL) 500 MG tablet Take 1 tablet (500 mg total) by mouth every 4 (four) hours as needed for mild pain, moderate pain or headache. 09/22/15  Yes Shela Leff, MD  darunavir-cobicistat ([name]) 800-150 MG tablet Take 1 tablet by mouth daily. Swallow whole. Do NOT crush, break or chew tablets. Take with food. 12/06/17  Yes Michel Bickers, MD  dolutegravir ([name]) 50 MG tablet Take 1 tablet (50 mg total) by mouth daily. 12/06/17  Yes Michel Bickers, MD  ENSURE (ENSURE) Take 1 Can by mouth 4 (four) times daily - after meals and at bedtime. 05/08/17  Yes Michel Bickers, MD  gabapentin (NEURONTIN) 400 MG capsule TAKE 1 CAPSULE(400 MG) BY MOUTH FOUR TIMES DAILY 12/07/17  Yes  Michel Bickers, MD  magnesium chloride (SLOW-MAG) 64 MG TBEC SR tablet Take 1 tablet (64 mg total) by mouth daily. 10/08/15  Yes Jule Ser, DO  morphine (MS CONTIN) 30 MG 12 hr tablet Take 3 tablets (90 mg total) by mouth every 8 (eight) hours. 01/07/18  Yes Michel Bickers, MD  Multiple Vitamin (MULTIVITAMIN WITH MINERALS) TABS tablet Take 1 tablet by mouth daily. 10/08/15  Yes Jule Ser, DO  oxyCODONE (OXY IR/ROXICODONE) 5 MG immediate release tablet Take 1 tablet (5 mg total) by mouth 2 (two) times daily as needed for severe pain. 01/07/18  Yes Michel Bickers, MD  pantoprazole (PROTONIX) 40 MG tablet TAKE 1 TABLET BY MOUTH ONCE DAILY 06/08/17  Yes Valinda Party, DO  phosphorus (K PHOS NEUTRAL) 155-852-130 MG tablet Take 3 tablets (750 mg total) by mouth 2 (two) times daily. 10/08/15  Yes Jule Ser, DO  potassium chloride SA (K-DUR,KLOR-CON) 20 MEQ tablet Take 1 tablet (20 mEq total) by mouth daily. 05/10/17  Yes Hoffman, Jessica Ratliff, DO  potassium citrate (UROCIT-K) 10 MEQ (1080 MG) SR tablet Take 4 tablets (40 mEq total) by mouth 2 (two) times daily with a meal. 09/22/15  Yes Shela Leff, MD  SANTYL ointment APPLY TOPICALLY TO THE AFFECTED AREA(S) DAILY 11/05/15  Yes Iline Oven, MD  sodium bicarbonate 650 MG tablet Take 2 tablets (1,300 mg total) by mouth 3 (three) times  daily. 10/08/15  Yes Jule Ser, DO  VEMLIDY 25 MG TABS TAKE 1 TABLET BY MOUTH DAILY 12/13/17  Yes Michel Bickers, MD  warfarin (COUMADIN) 5 MG tablet Take 1&1/2 tablets on Sundays, Tuesdays, Thursdays and Saturdays; all other days--take only one (1) tablet. 11/26/17  Yes Lucious Groves, DO   Past Medical History:  Diagnosis Date  . Chronic kidney disease   . History of chicken pox   . History of DVT of lower extremity    right  . HIV (human immunodeficiency virus infection) ([place])   . Hyperlipidemia   . Left hip pain 03/09/2013  . Pneumonia   . Protein malnutrition ([place]) 08/2015    Social History   Socioeconomic History  . Marital status: Widowed    Spouse name: Not on file  . Number of children: 3  . Years of education: Not on file  . Highest education level: Not on file  Social Needs  . Financial resource strain: Not on file  . Food insecurity - worry: Not on file  . Food insecurity - inability: Not on file  . Transportation needs - medical: Not on file  . Transportation needs - non-medical: Not on file  Occupational History    Employer: UNEMPLOYED  Tobacco Use  . Smoking status: Current Every Day Smoker    Packs/day: 0.50    Years: 36.00    Pack years: 18.00    Types: Cigarettes  . Smokeless tobacco: Never Used  . Tobacco comment: ABOUT 1/2PPD.  Slowing down  Substance and Sexual Activity  . Alcohol use: No    Alcohol/week: 0.0 oz  . Drug use: No  . Sexual activity: Not Currently    Comment: declined condoms  Other Topics Concern  . Not on file  Social History Narrative  . Not on file   Family History  Problem Relation Age of Onset  . Arthritis Mother   . Kidney disease Maternal Grandfather     ASSESSMENT Recent Results: The most recent result is correlated with 45 mg per week: Lab Results  Component Value Date   INR 2.7 01/14/2018   INR 2.80 12/17/2017   INR 2.4 11/26/2017   PROTIME 38.4 (H) 04/01/2012    Anticoagulation Dosing: Description   Take 1 & 1/2 tablets of your peach-colored 5mg  strength warfarin tablets Sundays, Tuesdays, Thursdays and Saturdays; on Mondays, Wednesdays and Fridays,  ONLY 1 tablet.      INR today: Therapeutic  PLAN Weekly dose was unchanged   Patient Instructions  Patient instructed to take medications as defined in the Anti-coagulation Track section of this encounter.  Patient instructed to take today's dose.  Patient instructed to take 1 & 1/2 tablets of your peach-colored 5mg  strength warfarin tablets Sundays, Tuesdays, Thursdays and Saturdays; on Mondays, Wednesdays and Fridays,  ONLY 1  tablet.  Patient verbalized understanding of these instructions.   Patient advised to contact clinic or seek medical attention if signs/symptoms of bleeding or thromboembolism occur.  Patient verbalized understanding by repeating back information and was advised to contact me if further medication-related questions arise. Patient was also provided an information handout.  Follow-up Return in 6 weeks (on 02/25/2018) for Follow up INR at 1000.  Mila Merry Gerarda Fraction, PharmD PGY1 Pharmacy Resident Pager: [phone]  15 minutes spent face-to-face with the patient during the encounter. 50% of time spent on education. 50% of time was spent on point of care INR testing, results interpretation, dose adjustment, and documentation in [facility] and [email or web address]

## 2018-01-14 NOTE — Progress Notes (Signed)
INTERNAL MEDICINE TEACHING ATTENDING ADDENDUM - Benita Boonstra M.D  Duration- indefinite, Indication- DVT, INR- therapeutic. Agree with pharmacy recommendations as outlined in their note.     

## 2018-01-31 ENCOUNTER — Other Ambulatory Visit: Payer: Self-pay | Admitting: *Deleted

## 2018-01-31 DIAGNOSIS — R634 Abnormal weight loss: Secondary | ICD-10-CM

## 2018-01-31 MED ORDER — ENSURE PO LIQD: 1.0000 | Freq: Three times a day (TID) | ORAL | 11 refills | Status: DC

## 2018-02-05 ENCOUNTER — Other Ambulatory Visit: Payer: Self-pay | Admitting: *Deleted

## 2018-02-05 DIAGNOSIS — G894 Chronic pain syndrome: Secondary | ICD-10-CM

## 2018-02-05 DIAGNOSIS — G546 Phantom limb syndrome with pain: Secondary | ICD-10-CM

## 2018-02-05 MED ORDER — OXYCODONE HCL 5 MG PO TABS: 5.0000 mg | ORAL_TABLET | Freq: Two times a day (BID) | ORAL | 0 refills | Status: DC | PRN

## 2018-02-05 MED ORDER — MORPHINE SULFATE ER 30 MG PO TBCR: 90.0000 mg | EXTENDED_RELEASE_TABLET | Freq: Three times a day (TID) | ORAL | 0 refills | Status: DC

## 2018-02-25 ENCOUNTER — Other Ambulatory Visit: Payer: Self-pay | Admitting: Pharmacist

## 2018-02-25 ENCOUNTER — Ambulatory Visit (INDEPENDENT_AMBULATORY_CARE_PROVIDER_SITE_OTHER): Payer: Medicare Other | Admitting: Pharmacist

## 2018-02-25 DIAGNOSIS — Z7901 Long term (current) use of anticoagulants: Secondary | ICD-10-CM

## 2018-02-25 DIAGNOSIS — Z5181 Encounter for therapeutic drug level monitoring: Secondary | ICD-10-CM | POA: Diagnosis not present

## 2018-02-25 DIAGNOSIS — Z86718 Personal history of other venous thrombosis and embolism: Secondary | ICD-10-CM

## 2018-02-25 LAB — POCT INR: INR: 2.2

## 2018-02-25 MED ORDER — WARFARIN SODIUM 5 MG PO TABS: ORAL_TABLET | ORAL | 0 refills | Status: DC

## 2018-02-25 NOTE — Progress Notes (Signed)
Anticoagulation Management [name] is a 57 y.o. male who reports to the clinic for monitoring of warfarin treatment.    Indication: DVT, history of; Long term current use of anticoagulant   Duration: indefinite Supervising physician: Oval Linsey  Anticoagulation Clinic Visit History: Patient does not report signs/symptoms of bleeding or thromboembolism  Other recent changes: No diet, medications, lifestyle changes endorsed by the patient to me at this visit.  Anticoagulation Episode Summary    Current INR goal:   2.0-3.0  TTR:   69.1 % (7.1 y)  Next INR check:   04/08/2018  INR from last check:   2.20 (02/25/2018)  Weekly max warfarin dose:     Target end date:   Indefinite  INR check location:   [facility]  Preferred lab:     Send INR reminders to:   [facility]   Indications   History of DVT (deep vein thrombosis) [Z86.718] Long term current use of anticoagulant [Z79.01]       Comments:         Anticoagulation Care Providers    Provider Role Specialty Phone number   Michel Bickers, MD  Infectious Diseases [phone]      Allergies  Allergen Reactions  . Dapsone     REACTION: fever  . Megestrol   . Sulfamethoxazole-Trimethoprim     REACTION: fever   Prior to Admission medications   Medication Sig Start Date End Date Taking? Authorizing Provider  acetaminophen (TYLENOL) 500 MG tablet Take 1 tablet (500 mg total) by mouth every 4 (four) hours as needed for mild pain, moderate pain or headache. 09/22/15  Yes Shela Leff, MD  darunavir-cobicistat ([name]) 800-150 MG tablet Take 1 tablet by mouth daily. Swallow whole. Do NOT crush, break or chew tablets. Take with food. 12/06/17  Yes Michel Bickers, MD  dolutegravir ([name]) 50 MG tablet Take 1 tablet (50 mg total) by mouth daily. 12/06/17  Yes Michel Bickers, MD  ENSURE (ENSURE) Take 1 Can by mouth 4 (four) times daily - after meals and at bedtime. 01/31/18  Yes Michel Bickers, MD  gabapentin  (NEURONTIN) 400 MG capsule TAKE 1 CAPSULE(400 MG) BY MOUTH FOUR TIMES DAILY 12/07/17  Yes Michel Bickers, MD  magnesium chloride (SLOW-MAG) 64 MG TBEC SR tablet Take 1 tablet (64 mg total) by mouth daily. 10/08/15  Yes Jule Ser, DO  morphine (MS CONTIN) 30 MG 12 hr tablet Take 3 tablets (90 mg total) by mouth every 8 (eight) hours. 02/05/18  Yes Michel Bickers, MD  Multiple Vitamin (MULTIVITAMIN WITH MINERALS) TABS tablet Take 1 tablet by mouth daily. 10/08/15  Yes Jule Ser, DO  oxyCODONE (OXY IR/ROXICODONE) 5 MG immediate release tablet Take 1 tablet (5 mg total) by mouth 2 (two) times daily as needed for severe pain. 02/05/18  Yes Michel Bickers, MD  pantoprazole (PROTONIX) 40 MG tablet TAKE 1 TABLET BY MOUTH ONCE DAILY 06/08/17  Yes Valinda Party, DO  phosphorus (K PHOS NEUTRAL) 155-852-130 MG tablet Take 3 tablets (750 mg total) by mouth 2 (two) times daily. 10/08/15  Yes Jule Ser, DO  potassium chloride SA (K-DUR,KLOR-CON) 20 MEQ tablet Take 1 tablet (20 mEq total) by mouth daily. 05/10/17  Yes Hoffman, Jessica Ratliff, DO  potassium citrate (UROCIT-K) 10 MEQ (1080 MG) SR tablet Take 4 tablets (40 mEq total) by mouth 2 (two) times daily with a meal. 09/22/15  Yes Shela Leff, MD  SANTYL ointment APPLY TOPICALLY TO THE AFFECTED AREA(S) DAILY 11/05/15  Yes Iline Oven, MD  sodium bicarbonate 650 MG tablet Take 2 tablets (1,300 mg total) by mouth 3 (three) times daily. 10/08/15  Yes Jule Ser, DO  VEMLIDY 25 MG TABS TAKE 1 TABLET BY MOUTH DAILY 12/13/17  Yes Michel Bickers, MD  warfarin (COUMADIN) 5 MG tablet Take 1&1/2 tablets on Sundays, Tuesdays, Thursdays and Saturdays; all other days--take only one (1) tablet. 02/25/18  Yes [name], RPH-CPP   Past Medical History:  Diagnosis Date  . Chronic kidney disease   . History of chicken pox   . History of DVT of lower extremity    right  . HIV (human immunodeficiency virus infection) ([place])   .  Hyperlipidemia   . Left hip pain 03/09/2013  . Pneumonia   . Protein malnutrition ([place]) 08/2015   Social History   Socioeconomic History  . Marital status: Widowed    Spouse name: Not on file  . Number of children: 3  . Years of education: Not on file  . Highest education level: Not on file  Occupational History    Employer: UNEMPLOYED  Social Needs  . Financial resource strain: Not on file  . Food insecurity:    Worry: Not on file    Inability: Not on file  . Transportation needs:    Medical: Not on file    Non-medical: Not on file  Tobacco Use  . Smoking status: Current Every Day Smoker    Packs/day: 0.50    Years: 36.00    Pack years: 18.00    Types: Cigarettes  . Smokeless tobacco: Never Used  . Tobacco comment: ABOUT 1/2PPD.  Slowing down  Substance and Sexual Activity  . Alcohol use: No    Alcohol/week: 0.0 oz  . Drug use: No  . Sexual activity: Not Currently    Comment: declined condoms  Lifestyle  . Physical activity:    Days per week: Not on file    Minutes per session: Not on file  . Stress: Not on file  Relationships  . Social connections:    Talks on phone: Not on file    Gets together: Not on file    Attends religious service: Not on file    Active member of club or organization: Not on file    Attends meetings of clubs or organizations: Not on file    Relationship status: Not on file  Other Topics Concern  . Not on file  Social History Narrative  . Not on file   Family History  Problem Relation Age of Onset  . Arthritis Mother   . Kidney disease Maternal Grandfather     ASSESSMENT Recent Results: The most recent result is correlated with 45 mg per week: Lab Results  Component Value Date   INR 2.20 02/25/2018   INR 2.7 01/14/2018   INR 2.80 12/17/2017   PROTIME 38.4 (H) 04/01/2012    Anticoagulation Dosing: Description   Take 1 & 1/2 tablets of your peach-colored 5mg  strength warfarin tablets Sundays, Tuesdays, Thursdays and  Saturdays; on Mondays, Wednesdays and Fridays,  ONLY 1 tablet.      INR today: Therapeutic  PLAN Weekly dose was unchanged.  Patient Instructions  Patient instructed to take medications as defined in the Anti-coagulation Track section of this encounter.  Patient instructed to take today's dose.  Patient instructed to take  1 & 1/2 tablets of your peach-colored 5mg  strength warfarin tablets Sundays, Tuesdays, Thursdays and Saturdays; on Mondays, Wednesdays and Fridays,  ONLY 1 tablet.  Patient verbalized understanding of these  instructions.     Patient advised to contact clinic or seek medical attention if signs/symptoms of bleeding or thromboembolism occur.  Patient verbalized understanding by repeating back information and was advised to contact me if further medication-related questions arise. Patient was also provided an information handout.  Follow-up Return in 6 weeks (on 04/08/2018) for Follow up INR at 1030h.  [name], PharmD, CACP, CPP  15 minutes spent face-to-face with the patient during the encounter. 50% of time spent on education. 50% of time was spent on fingerstick point of care INR sample collection, processing, results determination and documentation in [email or web address].

## 2018-02-25 NOTE — Patient Instructions (Signed)
Patient instructed to take medications as defined in the Anti-coagulation Track section of this encounter.  Patient instructed to take today's dose.  Patient instructed to take 1 & 1/2 tablets of your peach-colored 5mg strength warfarin tablets Sundays, Tuesdays, Thursdays and Saturdays; on Mondays, Wednesdays and Fridays,  ONLY 1 tablet.  Patient verbalized understanding of these instructions.   

## 2018-03-02 NOTE — Progress Notes (Signed)
Indication: Deep venous thrombhosis. Duration: Indefinite per patient preference. INR: At target. Agree with Dr. Gladstone Pih assessment and plan.

## 2018-03-03 ENCOUNTER — Other Ambulatory Visit: Payer: Self-pay | Admitting: Internal Medicine

## 2018-03-06 ENCOUNTER — Other Ambulatory Visit: Payer: Self-pay | Admitting: Behavioral Health

## 2018-03-06 DIAGNOSIS — G546 Phantom limb syndrome with pain: Secondary | ICD-10-CM

## 2018-03-06 DIAGNOSIS — G894 Chronic pain syndrome: Secondary | ICD-10-CM

## 2018-03-06 MED ORDER — OXYCODONE HCL 5 MG PO TABS: 5.0000 mg | ORAL_TABLET | Freq: Two times a day (BID) | ORAL | 0 refills | Status: DC | PRN

## 2018-03-06 MED ORDER — MORPHINE SULFATE ER 30 MG PO TBCR: 90.0000 mg | EXTENDED_RELEASE_TABLET | Freq: Three times a day (TID) | ORAL | 0 refills | Status: DC

## 2018-03-12 ENCOUNTER — Other Ambulatory Visit: Payer: Self-pay | Admitting: Internal Medicine

## 2018-03-12 DIAGNOSIS — B20 Human immunodeficiency virus [HIV] disease: Secondary | ICD-10-CM

## 2018-04-04 ENCOUNTER — Other Ambulatory Visit: Payer: Self-pay | Admitting: Behavioral Health

## 2018-04-04 DIAGNOSIS — G894 Chronic pain syndrome: Secondary | ICD-10-CM

## 2018-04-04 DIAGNOSIS — G546 Phantom limb syndrome with pain: Secondary | ICD-10-CM

## 2018-04-04 MED ORDER — MORPHINE SULFATE ER 30 MG PO TBCR: 90.0000 mg | EXTENDED_RELEASE_TABLET | Freq: Three times a day (TID) | ORAL | 0 refills | Status: DC

## 2018-04-04 MED ORDER — OXYCODONE HCL 5 MG PO TABS: 5.0000 mg | ORAL_TABLET | Freq: Two times a day (BID) | ORAL | 0 refills | Status: DC | PRN

## 2018-04-04 NOTE — Addendum Note (Signed)
Addended by: Alberteen Sam on: 04/04/2018 09:47 AM   Modules accepted: Orders

## 2018-04-08 ENCOUNTER — Ambulatory Visit (INDEPENDENT_AMBULATORY_CARE_PROVIDER_SITE_OTHER): Payer: Medicare Other | Admitting: Pharmacy Technician

## 2018-04-08 DIAGNOSIS — Z86718 Personal history of other venous thrombosis and embolism: Secondary | ICD-10-CM | POA: Diagnosis not present

## 2018-04-08 DIAGNOSIS — Z7901 Long term (current) use of anticoagulants: Secondary | ICD-10-CM | POA: Diagnosis not present

## 2018-04-08 LAB — POCT INR: INR: 2.6 (ref 2.0–3.0)

## 2018-04-08 NOTE — Progress Notes (Signed)
INTERNAL MEDICINE TEACHING ATTENDING ADDENDUM - Nancy Manuele M.D  Duration- indefinite, Indication- DVT, INR- therapeutic. Agree with pharmacy recommendations as outlined in their note.     

## 2018-04-08 NOTE — Patient Instructions (Signed)
Patient instructed to take medications as defined in the Anti-coagulation Track section of this encounter.  Patient instructed to take today's dose.  Patient instructed to take 1 & 1/2 tablets of your peach-colored 5mg strength warfarin tablets Sundays, Tuesdays, Thursdays and Saturdays; on Mondays, Wednesdays and Fridays,  ONLY 1 tablet.  Patient verbalized understanding of these instructions.   

## 2018-04-08 NOTE — Progress Notes (Signed)
Anticoagulation Management [name] is a 57 y.o. male who reports to the clinic for monitoring of warfarin treatment.    Indication: History of DVT  Duration: indefinite Supervising physician: Aldine Contes  Anticoagulation Clinic Visit History: Patient does not report signs/symptoms of bleeding or thromboembolism. Other recent changes: No diet, medications, or lifestyle changes reported at this visit. Anticoagulation Episode Summary    Current INR goal:   2.0-3.0  TTR:   69.6 % (7.2 y)  Next INR check:   05/20/2018  INR from last check:   2.6 (04/08/2018)  Weekly max warfarin dose:     Target end date:   Indefinite  INR check location:   [facility]  Preferred lab:     Send INR reminders to:   [facility]   Indications   History of DVT (deep vein thrombosis) [Z86.718] Long term current use of anticoagulant [Z79.01]       Comments:         Anticoagulation Care Providers    Provider Role Specialty Phone number   Michel Bickers, MD  Infectious Diseases [phone]      Allergies  Allergen Reactions  . Dapsone     REACTION: fever  . Megestrol   . Sulfamethoxazole-Trimethoprim     REACTION: fever   Prior to Admission medications   Medication Sig Start Date End Date Taking? Authorizing Provider  acetaminophen (TYLENOL) 500 MG tablet Take 1 tablet (500 mg total) by mouth every 4 (four) hours as needed for mild pain, moderate pain or headache. 09/22/15  Yes Shela Leff, MD  ENSURE (ENSURE) Take 1 Can by mouth 4 (four) times daily - after meals and at bedtime. 01/31/18  Yes Michel Bickers, MD  gabapentin (NEURONTIN) 400 MG capsule TAKE 1 CAPSULE(400 MG) BY MOUTH FOUR TIMES DAILY 12/07/17  Yes Michel Bickers, MD  magnesium chloride (SLOW-MAG) 64 MG TBEC SR tablet Take 1 tablet (64 mg total) by mouth daily. 10/08/15  Yes Jule Ser, DO  morphine (MS CONTIN) 30 MG 12 hr tablet Take 3 tablets (90 mg total) by mouth every 8 (eight) hours. 04/04/18  Yes  Michel Bickers, MD  Multiple Vitamin (MULTIVITAMIN WITH MINERALS) TABS tablet Take 1 tablet by mouth daily. 10/08/15  Yes Jule Ser, DO  oxyCODONE (OXY IR/ROXICODONE) 5 MG immediate release tablet Take 1 tablet (5 mg total) by mouth 2 (two) times daily as needed for severe pain. 04/04/18  Yes Michel Bickers, MD  pantoprazole (PROTONIX) 40 MG tablet TAKE 1 TABLET BY MOUTH ONCE DAILY 03/04/18  Yes Hoffman, Elza Rafter, DO  phosphorus (K PHOS NEUTRAL) 155-852-130 MG tablet Take 3 tablets (750 mg total) by mouth 2 (two) times daily. 10/08/15  Yes Jule Ser, DO  potassium chloride SA (K-DUR,KLOR-CON) 20 MEQ tablet Take 1 tablet (20 mEq total) by mouth daily. 05/10/17  Yes Hoffman, Jessica Ratliff, DO  potassium citrate (UROCIT-K) 10 MEQ (1080 MG) SR tablet Take 4 tablets (40 mEq total) by mouth 2 (two) times daily with a meal. 09/22/15  Yes Shela Leff, MD  [name] 800-150 MG tablet TAKE 1 TABLET BY MOUTH DAILY. SWALLOW WHOLE. DO NOT CRUSH, BREAK OR CHEW TABLETS. TAKE WITH FOOD. 03/12/18  Yes Michel Bickers, MD  SANTYL ointment APPLY TOPICALLY TO THE AFFECTED AREA(S) DAILY 11/05/15  Yes Iline Oven, MD  sodium bicarbonate 650 MG tablet Take 2 tablets (1,300 mg total) by mouth 3 (three) times daily. 10/08/15  Yes Jule Ser, DO  [name] 50 MG tablet TAKE 1 TABLET (50 MG TOTAL)  BY MOUTH DAILY. 03/12/18  Yes Michel Bickers, MD  VEMLIDY 25 MG TABS TAKE 1 TABLET BY MOUTH DAILY 12/13/17  Yes Michel Bickers, MD  warfarin (COUMADIN) 5 MG tablet TAKE 1 AND 1/2 TABLETS BY MOUTH ON SUNDAYS, TUESDAYS, THURSDAYS, AND SATURDAYS, ALL OTHER DAYS TAKE ONLY 1 TABLET 02/27/18  Yes Hoffman, Elza Rafter, DO   Past Medical History:  Diagnosis Date  . Chronic kidney disease   . History of chicken pox   . History of DVT of lower extremity    right  . HIV (human immunodeficiency virus infection) ([place])   . Hyperlipidemia   . Left hip pain 03/09/2013  . Pneumonia   . Protein malnutrition ([place])  08/2015   Social History   Socioeconomic History  . Marital status: Widowed    Spouse name: Not on file  . Number of children: 3  . Years of education: Not on file  . Highest education level: Not on file  Occupational History    Employer: UNEMPLOYED  Social Needs  . Financial resource strain: Not on file  . Food insecurity:    Worry: Not on file    Inability: Not on file  . Transportation needs:    Medical: Not on file    Non-medical: Not on file  Tobacco Use  . Smoking status: Current Every Day Smoker    Packs/day: 0.50    Years: 36.00    Pack years: 18.00    Types: Cigarettes  . Smokeless tobacco: Never Used  . Tobacco comment: ABOUT 1/2PPD.  Slowing down  Substance and Sexual Activity  . Alcohol use: No    Alcohol/week: 0.0 oz  . Drug use: No  . Sexual activity: Not Currently    Comment: declined condoms  Lifestyle  . Physical activity:    Days per week: Not on file    Minutes per session: Not on file  . Stress: Not on file  Relationships  . Social connections:    Talks on phone: Not on file    Gets together: Not on file    Attends religious service: Not on file    Active member of club or organization: Not on file    Attends meetings of clubs or organizations: Not on file    Relationship status: Not on file  Other Topics Concern  . Not on file  Social History Narrative  . Not on file   Family History  Problem Relation Age of Onset  . Arthritis Mother   . Kidney disease Maternal Grandfather     ASSESSMENT Recent Results: The most recent result is correlated with 45 mg per week: Lab Results  Component Value Date   INR 2.6 04/08/2018   INR 2.20 02/25/2018   INR 2.7 01/14/2018   PROTIME 38.4 (H) 04/01/2012    Anticoagulation Dosing: Description   Take 1 & 1/2 tablets of your peach-colored 5mg  strength warfarin tablets Sundays, Tuesdays, Thursdays and Saturdays; on Mondays, Wednesdays and Fridays,  ONLY 1 tablet.      INR today:  Therapeutic  PLAN Weekly dose was unchanged   Patient Instructions  Patient instructed to take medications as defined in the Anti-coagulation Track section of this encounter.  Patient instructed to take today's dose.  Patient instructed to take 1 & 1/2 tablets of your peach-colored 5mg  strength warfarin tablets Sundays, Tuesdays, Thursdays and Saturdays; on Mondays, Wednesdays and Fridays,  ONLY 1 tablet Patient verbalized understanding of these instructions.     Patient advised to contact clinic or  seek medical attention if signs/symptoms of bleeding or thromboembolism occur.  Patient verbalized understanding by repeating back information and was advised to contact me if further medication-related questions arise. Patient was also provided an information handout.  Follow-up Return in about 6 weeks (around 05/20/2018) for INR follow up at 1000.  Bertis Ruddy, PharmD Pharmacy Resident [phone] 04/08/2018 10:21 AM  15 minutes spent face-to-face with the patient during the encounter. 50% of time spent on education. 50% of time was spent on INR point of care testing, analysis, and documentation in EMR.

## 2018-05-07 ENCOUNTER — Telehealth: Payer: Self-pay

## 2018-05-07 DIAGNOSIS — G546 Phantom limb syndrome with pain: Secondary | ICD-10-CM

## 2018-05-07 DIAGNOSIS — G894 Chronic pain syndrome: Secondary | ICD-10-CM

## 2018-05-07 MED ORDER — OXYCODONE HCL 5 MG PO TABS: 5.0000 mg | ORAL_TABLET | Freq: Two times a day (BID) | ORAL | 0 refills | Status: DC | PRN

## 2018-05-07 MED ORDER — MORPHINE SULFATE ER 30 MG PO TBCR: 90.0000 mg | EXTENDED_RELEASE_TABLET | Freq: Three times a day (TID) | ORAL | 0 refills | Status: DC

## 2018-05-07 NOTE — Telephone Encounter (Signed)
Oxycodone and Morphine script ready for pick up. Patient informed  Laverle Patter, RN

## 2018-05-07 NOTE — Telephone Encounter (Signed)
Patient is calling for controlled medicaitons. Dr Megan Salon is out of the office . I will ask Dr Baxter Flattery to sign script.   Call patient when ready.   Laverle Patter, RN

## 2018-05-09 NOTE — Telephone Encounter (Signed)
Pt came into clinic today to pick up scripts was able to confirm pt ID with Drivers license.  [place]

## 2018-05-20 ENCOUNTER — Ambulatory Visit (INDEPENDENT_AMBULATORY_CARE_PROVIDER_SITE_OTHER): Payer: Medicare Other | Admitting: Pharmacist

## 2018-05-20 DIAGNOSIS — Z7901 Long term (current) use of anticoagulants: Secondary | ICD-10-CM

## 2018-05-20 DIAGNOSIS — Z86718 Personal history of other venous thrombosis and embolism: Secondary | ICD-10-CM | POA: Diagnosis not present

## 2018-05-20 DIAGNOSIS — Z5181 Encounter for therapeutic drug level monitoring: Secondary | ICD-10-CM | POA: Diagnosis not present

## 2018-05-20 LAB — POCT INR: INR: 2.4 (ref 2.0–3.0)

## 2018-05-20 NOTE — Progress Notes (Signed)
I reviewed Dr. Gladstone Pih note. Patient is on [facility] for h/o DVT and INR at goal.

## 2018-05-20 NOTE — Patient Instructions (Signed)
Patient instructed to take medications as defined in the Anti-coagulation Track section of this encounter.  Patient instructed to take today's dose.  Patient instructed to take 1 & 1/2 tablets of your peach-colored 5mg strength warfarin tablets Sundays, Tuesdays, Thursdays and Saturdays; on Mondays, Wednesdays and Fridays,  ONLY 1 tablet.  Patient verbalized understanding of these instructions.   

## 2018-05-20 NOTE — Progress Notes (Signed)
Anticoagulation Management [name] is a 57 y.o. male who reports to the clinic for monitoring of warfarin treatment.    Indication: DVT, history of; long term current use of anticoagulant. Duration: indefinite Supervising physician: [facility] Visit History: Patient does not report signs/symptoms of bleeding or thromboembolism  Other recent changes: No diet, medications, lifestyle changes endorsed.  Anticoagulation Episode Summary    Current INR goal:   2.0-3.0  TTR:   70.1 % (7.3 y)  Next INR check:   06/24/2018  INR from last check:   2.4 (05/20/2018)  Weekly max warfarin dose:     Target end date:   Indefinite  INR check location:   Anticoagulation Clinic  Preferred lab:     Send INR reminders to:   [facility]   Indications   History of DVT (deep vein thrombosis) [Z86.718] Long term current use of anticoagulant [Z79.01]       Comments:         Anticoagulation Care Providers    Provider Role Specialty Phone number   Michel Bickers, MD  Infectious Diseases [phone]      Allergies  Allergen Reactions  . Dapsone     REACTION: fever  . Megestrol   . Sulfamethoxazole-Trimethoprim     REACTION: fever   Prior to Admission medications   Medication Sig Start Date End Date Taking? Authorizing Provider  acetaminophen (TYLENOL) 500 MG tablet Take 1 tablet (500 mg total) by mouth every 4 (four) hours as needed for mild pain, moderate pain or headache. 09/22/15  Yes Shela Leff, MD  ENSURE (ENSURE) Take 1 Can by mouth 4 (four) times daily - after meals and at bedtime. 01/31/18  Yes Michel Bickers, MD  gabapentin (NEURONTIN) 400 MG capsule TAKE 1 CAPSULE(400 MG) BY MOUTH FOUR TIMES DAILY 12/07/17  Yes Michel Bickers, MD  magnesium chloride (SLOW-MAG) 64 MG TBEC SR tablet Take 1 tablet (64 mg total) by mouth daily. 10/08/15  Yes Jule Ser, DO  morphine (MS CONTIN) 30 MG 12 hr tablet Take 3 tablets (90 mg total) by mouth every 8 (eight)  hours. 05/07/18  Yes Carlyle Basques, MD  Multiple Vitamin (MULTIVITAMIN WITH MINERALS) TABS tablet Take 1 tablet by mouth daily. 10/08/15  Yes Jule Ser, DO  oxyCODONE (OXY IR/ROXICODONE) 5 MG immediate release tablet Take 1 tablet (5 mg total) by mouth 2 (two) times daily as needed for severe pain. 05/07/18  Yes Carlyle Basques, MD  pantoprazole (PROTONIX) 40 MG tablet TAKE 1 TABLET BY MOUTH ONCE DAILY 03/04/18  Yes Hoffman, Elza Rafter, DO  phosphorus (K PHOS NEUTRAL) 155-852-130 MG tablet Take 3 tablets (750 mg total) by mouth 2 (two) times daily. 10/08/15  Yes Jule Ser, DO  potassium chloride SA (K-DUR,KLOR-CON) 20 MEQ tablet Take 1 tablet (20 mEq total) by mouth daily. 05/10/17  Yes Hoffman, Jessica Ratliff, DO  potassium citrate (UROCIT-K) 10 MEQ (1080 MG) SR tablet Take 4 tablets (40 mEq total) by mouth 2 (two) times daily with a meal. 09/22/15  Yes Shela Leff, MD  [name] 800-150 MG tablet TAKE 1 TABLET BY MOUTH DAILY. SWALLOW WHOLE. DO NOT CRUSH, BREAK OR CHEW TABLETS. TAKE WITH FOOD. 03/12/18  Yes Michel Bickers, MD  SANTYL ointment APPLY TOPICALLY TO THE AFFECTED AREA(S) DAILY 11/05/15  Yes Iline Oven, MD  sodium bicarbonate 650 MG tablet Take 2 tablets (1,300 mg total) by mouth 3 (three) times daily. 10/08/15  Yes Jule Ser, DO  [name] 50 MG tablet TAKE 1 TABLET (  50 MG TOTAL) BY MOUTH DAILY. 03/12/18  Yes Michel Bickers, MD  VEMLIDY 25 MG TABS TAKE 1 TABLET BY MOUTH DAILY 12/13/17  Yes Michel Bickers, MD  warfarin (COUMADIN) 5 MG tablet TAKE 1 AND 1/2 TABLETS BY MOUTH ON SUNDAYS, TUESDAYS, THURSDAYS, AND SATURDAYS, ALL OTHER DAYS TAKE ONLY 1 TABLET 02/27/18  Yes Hoffman, Elza Rafter, DO   Past Medical History:  Diagnosis Date  . Chronic kidney disease   . History of chicken pox   . History of DVT of lower extremity    right  . HIV (human immunodeficiency virus infection) ([place])   . Hyperlipidemia   . Left hip pain 03/09/2013  . Pneumonia   . Protein  malnutrition ([place]) 08/2015   Social History   Socioeconomic History  . Marital status: Widowed    Spouse name: Not on file  . Number of children: 3  . Years of education: Not on file  . Highest education level: Not on file  Occupational History    Employer: UNEMPLOYED  Social Needs  . Financial resource strain: Not on file  . Food insecurity:    Worry: Not on file    Inability: Not on file  . Transportation needs:    Medical: Not on file    Non-medical: Not on file  Tobacco Use  . Smoking status: Current Every Day Smoker    Packs/day: 0.50    Years: 36.00    Pack years: 18.00    Types: Cigarettes  . Smokeless tobacco: Never Used  . Tobacco comment: ABOUT 1/2PPD.  Slowing down  Substance and Sexual Activity  . Alcohol use: No    Alcohol/week: 0.0 oz  . Drug use: No  . Sexual activity: Not Currently    Comment: declined condoms  Lifestyle  . Physical activity:    Days per week: Not on file    Minutes per session: Not on file  . Stress: Not on file  Relationships  . Social connections:    Talks on phone: Not on file    Gets together: Not on file    Attends religious service: Not on file    Active member of club or organization: Not on file    Attends meetings of clubs or organizations: Not on file    Relationship status: Not on file  Other Topics Concern  . Not on file  Social History Narrative  . Not on file   Family History  Problem Relation Age of Onset  . Arthritis Mother   . Kidney disease Maternal Grandfather     ASSESSMENT Recent Results: The most recent result is correlated with 45 mg per week: Lab Results  Component Value Date   INR 2.4 05/20/2018   INR 2.6 04/08/2018   INR 2.20 02/25/2018   PROTIME 38.4 (H) 04/01/2012    Anticoagulation Dosing: Description   Take 1 & 1/2 tablets of your peach-colored 5mg  strength warfarin tablets Sundays, Tuesdays, Thursdays and Saturdays; on Mondays, Wednesdays and Fridays,  ONLY 1 tablet.      INR  today: Therapeutic  PLAN Weekly dose was unchanged.   Patient Instructions  Patient instructed to take medications as defined in the Anti-coagulation Track section of this encounter.  Patient instructed to take today's dose.  Patient instructed to take 1 & 1/2 tablets of your peach-colored 5mg  strength warfarin tablets Sundays, Tuesdays, Thursdays and Saturdays; on Mondays, Wednesdays and Fridays,  ONLY 1 tablet.  Patient verbalized understanding of these instructions.     Patient advised  to contact clinic or seek medical attention if signs/symptoms of bleeding or thromboembolism occur.  Patient verbalized understanding by repeating back information and was advised to contact me if further medication-related questions arise. Patient was also provided an information handout.  Follow-up Return in 5 weeks (on 06/24/2018) for Follow up INR at 0900h.  [name], PharmD, CACP, CPP  15 minutes spent face-to-face with the patient during the encounter. 50% of time spent on education. 50% of time was spent on fingerstick point of care INR sample collection, processing, results determination, and documentation in [email or web address].

## 2018-05-23 ENCOUNTER — Other Ambulatory Visit: Payer: Medicare Other

## 2018-05-23 DIAGNOSIS — B20 Human immunodeficiency virus [HIV] disease: Secondary | ICD-10-CM

## 2018-05-24 LAB — T-HELPER CELL (CD4) - (RCID CLINIC ONLY)
CD4 % Helper T Cell: 21 % — ABNORMAL LOW (ref 33–55)
CD4 T CELL ABS: 920 /uL (ref 400–2700)

## 2018-05-26 ENCOUNTER — Other Ambulatory Visit: Payer: Self-pay | Admitting: Pharmacist

## 2018-05-27 ENCOUNTER — Other Ambulatory Visit: Payer: Self-pay | Admitting: Pharmacist

## 2018-05-27 LAB — HIV-1 RNA QUANT-NO REFLEX-BLD
HIV 1 RNA Quant: <20 copies/mL
HIV-1 RNA QUANT, LOG: NOT DETECTED {Log_copies}/mL

## 2018-05-27 MED ORDER — WARFARIN SODIUM 5 MG PO TABS: ORAL_TABLET | ORAL | 0 refills | Status: DC

## 2018-05-27 NOTE — Telephone Encounter (Signed)
Refill authorization requested of me. Will send his pharmacy a refill authorization.

## 2018-06-06 ENCOUNTER — Encounter: Payer: Self-pay | Admitting: Internal Medicine

## 2018-06-06 ENCOUNTER — Ambulatory Visit (INDEPENDENT_AMBULATORY_CARE_PROVIDER_SITE_OTHER): Payer: Medicare Other | Admitting: Internal Medicine

## 2018-06-06 DIAGNOSIS — G546 Phantom limb syndrome with pain: Secondary | ICD-10-CM

## 2018-06-06 DIAGNOSIS — B20 Human immunodeficiency virus [HIV] disease: Secondary | ICD-10-CM | POA: Diagnosis not present

## 2018-06-06 DIAGNOSIS — G894 Chronic pain syndrome: Secondary | ICD-10-CM | POA: Diagnosis not present

## 2018-06-06 MED ORDER — OXYCODONE HCL 5 MG PO TABS: 5.0000 mg | ORAL_TABLET | Freq: Two times a day (BID) | ORAL | 0 refills | Status: DC | PRN

## 2018-06-06 MED ORDER — MORPHINE SULFATE ER 30 MG PO TBCR: 90.0000 mg | EXTENDED_RELEASE_TABLET | Freq: Three times a day (TID) | ORAL | 0 refills | Status: DC

## 2018-06-06 NOTE — Assessment & Plan Note (Signed)
His infection is under excellent, long-term control.  He will continue his current 3 drug regimen and follow-up after lab work in 6 months.

## 2018-06-06 NOTE — Progress Notes (Signed)
Patient Active Problem List   Diagnosis Date Noted  . Phantom limb syndrome with pain ([place]) 03/21/2012    Priority: High  . Long term current use of anticoagulant 11/20/2010    Priority: High  . History of DVT (deep vein thrombosis) 07/19/2009    Priority: High  . Human immunodeficiency virus (HIV) disease ([place]) 11/10/2006    Priority: High  . DYSLIPIDEMIA 11/10/2006    Priority: High  . CIGARETTE SMOKER 11/10/2006    Priority: High  . Prosthesis adjustment 07/20/2017  . Long term current use of opiate analgesic 12/17/2015  . Pressure ulcer 09/13/2015  . Protein-calorie malnutrition, severe 09/13/2015  . Avascular necrosis of femoral head ([place]) 05/14/2015  . Left adrenal mass ([place]) 05/14/2015  . Preventative health care 03/09/2013  . ERECTILE DYSFUNCTION, ORGANIC 04/12/2010  . GERD 08/23/2009  . HYPOGONADISM 07/15/2009  . DEFICIENCY OF OTHER VITAMINS 07/15/2009  . Pulmonary nodule 07/15/2009  . Status post above knee amputation ([place]) 07/15/2009  . SHINGLES, RECURRENT 11/10/2006  . Alcohol abuse 11/10/2006  . PERIPHERAL NEUROPATHY 11/10/2006  . ALLERGIC RHINITIS 11/10/2006    Patient's Medications  New Prescriptions   No medications on file  Previous Medications   ACETAMINOPHEN (TYLENOL) 500 MG TABLET    Take 1 tablet (500 mg total) by mouth every 4 (four) hours as needed for mild pain, moderate pain or headache.   ENSURE (ENSURE)    Take 1 Can by mouth 4 (four) times daily - after meals and at bedtime.   GABAPENTIN (NEURONTIN) 400 MG CAPSULE    TAKE 1 CAPSULE(400 MG) BY MOUTH FOUR TIMES DAILY   MAGNESIUM CHLORIDE (SLOW-MAG) 64 MG TBEC SR TABLET    Take 1 tablet (64 mg total) by mouth daily.   MULTIPLE VITAMIN (MULTIVITAMIN WITH MINERALS) TABS TABLET    Take 1 tablet by mouth daily.   PANTOPRAZOLE (PROTONIX) 40 MG TABLET    TAKE 1 TABLET BY MOUTH ONCE DAILY   PHOSPHORUS (K PHOS NEUTRAL) 155-852-130 MG TABLET    Take 3 tablets (750 mg total) by mouth 2 (two)  times daily.   POTASSIUM CHLORIDE SA (K-DUR,KLOR-CON) 20 MEQ TABLET    Take 1 tablet (20 mEq total) by mouth daily.   POTASSIUM CITRATE (UROCIT-K) 10 MEQ (1080 MG) SR TABLET    Take 4 tablets (40 mEq total) by mouth 2 (two) times daily with a meal.   [name] 800-150 MG TABLET    TAKE 1 TABLET BY MOUTH DAILY. SWALLOW WHOLE. DO NOT CRUSH, BREAK OR CHEW TABLETS. TAKE WITH FOOD.   SANTYL OINTMENT    APPLY TOPICALLY TO THE AFFECTED AREA(S) DAILY   SODIUM BICARBONATE 650 MG TABLET    Take 2 tablets (1,300 mg total) by mouth 3 (three) times daily.   [name] 50 MG TABLET    TAKE 1 TABLET (50 MG TOTAL) BY MOUTH DAILY.   VEMLIDY 25 MG TABS    TAKE 1 TABLET BY MOUTH DAILY   WARFARIN (COUMADIN) 5 MG TABLET    TAKE 1 AND 1/2 TABLETS BY MOUTH ON SUNDAYS, TUESDAYS, THURSDAYS, AND SATURDAYS, ALL OTHER DAYS TAKE ONLY 1 TABLET   WARFARIN (COUMADIN) 5 MG TABLET    TAKE 1 AND 1/2 TABLETS BY MOUTH ON SUNDAYS, TUESDAYS, THURSDAYS, AND SATURDAYS, ALL OTHER DAYS TAKE ONLY 1 TABLET  Modified Medications   Modified Medication Previous Medication   MORPHINE (MS CONTIN) 30 MG 12 HR TABLET morphine (MS CONTIN) 30 MG 12 hr tablet  Take 3 tablets (90 mg total) by mouth every 8 (eight) hours.    Take 3 tablets (90 mg total) by mouth every 8 (eight) hours.   OXYCODONE (OXY IR/ROXICODONE) 5 MG IMMEDIATE RELEASE TABLET oxyCODONE (OXY IR/ROXICODONE) 5 MG immediate release tablet      Take 1 tablet (5 mg total) by mouth 2 (two) times daily as needed for severe pain.    Take 1 tablet (5 mg total) by mouth 2 (two) times daily as needed for severe pain.  Discontinued Medications   No medications on file    Subjective: [name] is in for his routine HIV follow-up visit.  He has had no problems obtaining, taking or tolerating his Vemlidy, [name] or [name].  He denies missing any doses.  He takes them each day around noon.  His chronic pain is well controlled.  He does notice some throbbing when he takes off his right leg  prosthesis.  For the past several months she has been bothered by red irritated eyes.  He cuts wood and metal with power saw and thinks he may have gotten something in his eyes.  He does not wear contacts.  He has been using Visine eyedrops.  He lost his prescription eyeglasses.  Review of Systems: Review of Systems  Constitutional: Negative for chills, diaphoresis, fever, malaise/fatigue and weight loss.  HENT: Negative for sore throat.   Eyes:       As noted in HPI.  Respiratory: Negative for cough, sputum production and shortness of breath.   Cardiovascular: Negative for chest pain.  Gastrointestinal: Negative for abdominal pain, diarrhea, heartburn, nausea and vomiting.  Genitourinary: Negative for dysuria and frequency.  Musculoskeletal: Positive for joint pain. Negative for myalgias.  Skin: Negative for rash.  Neurological: Negative for dizziness and headaches.  Psychiatric/Behavioral: Negative for depression and substance abuse. The patient is not nervous/anxious.     Past Medical History:  Diagnosis Date  . Chronic kidney disease   . History of chicken pox   . History of DVT of lower extremity    right  . HIV (human immunodeficiency virus infection) ([place])   . Hyperlipidemia   . Left hip pain 03/09/2013  . Pneumonia   . Protein malnutrition ([place]) 08/2015    Social History   Tobacco Use  . Smoking status: Current Every Day Smoker    Packs/day: 0.50    Years: 36.00    Pack years: 18.00    Types: Cigarettes  . Smokeless tobacco: Never Used  . Tobacco comment: ABOUT 1/2PPD.  Slowing down  Substance Use Topics  . Alcohol use: No    Alcohol/week: 0.0 standard drinks  . Drug use: No    Family History  Problem Relation Age of Onset  . Arthritis Mother   . Kidney disease Maternal Grandfather     Allergies  Allergen Reactions  . Dapsone     REACTION: fever  . Megestrol   . Sulfamethoxazole-Trimethoprim     REACTION: fever    Health Maintenance  Topic Date  Due  . COLONOSCOPY  03/18/2011  . INFLUENZA VACCINE  05/30/2018  . COLON CANCER SCREENING ANNUAL FOBT  12/20/2018  . TETANUS/TDAP  10/08/2024  . Hepatitis C Screening  Completed  . HIV Screening  Completed    Objective:  Vitals:   06/06/18 0928  BP: 117/72  Pulse: (!) 103  Temp: 98.8 F (37.1 C)   There is no height or weight on file to calculate BMI.  Physical Exam  Constitutional: He is  oriented to person, place, and time.  He is seated in his electric wheelchair.  HENT:  Mouth/Throat: No oropharyngeal exudate.  Eyes:  Mild, bilateral conjunctival injection.  Cardiovascular: Normal rate, regular rhythm and normal heart sounds.  No murmur heard. Pulmonary/Chest: Effort normal and breath sounds normal.  Abdominal: Soft. He exhibits no mass. There is no tenderness.  Musculoskeletal: Normal range of motion.  Neurological: He is alert and oriented to person, place, and time.  Skin: No rash noted.    Lab Results Lab Results  Component Value Date   WBC 8.2 11/22/2017   HGB 17.5 (H) 11/22/2017   HCT 51.6 (H) 11/22/2017   MCV 88.1 11/22/2017   PLT 279 11/22/2017    Lab Results  Component Value Date   CREATININE 1.61 (H) 11/22/2017   BUN 12 11/22/2017   NA 138 11/22/2017   K 3.8 11/22/2017   CL 98 11/22/2017   CO2 27 11/22/2017    Lab Results  Component Value Date   ALT 15 11/22/2017   AST 20 11/22/2017   ALKPHOS 408 (H) 05/22/2017   BILITOT 0.8 11/22/2017    Lab Results  Component Value Date   CHOL 211 (H) 05/16/2016   HDL 40 05/16/2016   LDLCALC 133 (H) 05/16/2016   TRIG 189 (H) 05/16/2016   CHOLHDL 5.3 (H) 05/16/2016   Lab Results  Component Value Date   LABRPR NON REAC 05/22/2017   HIV 1 RNA Quant (copies/mL)  Date Value  05/23/2018 <20 NOT DETECTED  11/22/2017 50 (H)  05/22/2017 <20 NOT DETECTED   CD4 T Cell Abs (/uL)  Date Value  05/23/2018 920  11/22/2017 800  05/22/2017 740     Problem List Items Addressed This Visit      High    Human immunodeficiency virus (HIV) disease ([place])    His infection is under excellent, long-term control.  He will continue his current 3 drug regimen and follow-up after lab work in 6 months.      Relevant Orders   T-helper cell (CD4)- ([facility] clinic only)   HIV 1 RNA quant-no reflex-bld   CBC   Comprehensive metabolic panel   RPR   Phantom limb syndrome with pain ([place])    His chronic pain syndrome is under good control with his current pain regimen.      Relevant Medications   morphine (MS CONTIN) 30 MG 12 hr tablet    Other Visit Diagnoses    Chronic pain syndrome       Relevant Medications   oxyCODONE (OXY IR/ROXICODONE) 5 MG immediate release tablet        Michel Bickers, MD [facility] [phone] pager   [phone] cell 06/06/2018, 9:53 AM

## 2018-06-06 NOTE — Assessment & Plan Note (Signed)
His chronic pain syndrome is under good control with his current pain regimen.

## 2018-06-10 ENCOUNTER — Other Ambulatory Visit: Payer: Self-pay | Admitting: Internal Medicine

## 2018-06-11 ENCOUNTER — Other Ambulatory Visit: Payer: Self-pay | Admitting: Internal Medicine

## 2018-06-11 ENCOUNTER — Other Ambulatory Visit: Payer: Self-pay

## 2018-06-11 DIAGNOSIS — B20 Human immunodeficiency virus [HIV] disease: Secondary | ICD-10-CM

## 2018-06-11 MED ORDER — DOLUTEGRAVIR SODIUM 50 MG PO TABS: ORAL_TABLET | ORAL | 5 refills | Status: DC

## 2018-06-11 MED ORDER — DARUNAVIR-COBICISTAT 800-150 MG PO TABS: ORAL_TABLET | ORAL | 5 refills | Status: DC

## 2018-06-11 MED ORDER — TENOFOVIR ALAFENAMIDE FUMARATE 25 MG PO TABS: 1.0000 | ORAL_TABLET | Freq: Every day | ORAL | 5 refills | Status: DC

## 2018-06-20 ENCOUNTER — Ambulatory Visit (INDEPENDENT_AMBULATORY_CARE_PROVIDER_SITE_OTHER): Payer: Medicare Other | Admitting: Internal Medicine

## 2018-06-20 DIAGNOSIS — Z0000 Encounter for general adult medical examination without abnormal findings: Secondary | ICD-10-CM

## 2018-06-20 DIAGNOSIS — K219 Gastro-esophageal reflux disease without esophagitis: Secondary | ICD-10-CM | POA: Diagnosis not present

## 2018-06-20 DIAGNOSIS — Z79899 Other long term (current) drug therapy: Secondary | ICD-10-CM

## 2018-06-20 DIAGNOSIS — F17210 Nicotine dependence, cigarettes, uncomplicated: Secondary | ICD-10-CM | POA: Diagnosis not present

## 2018-06-20 MED ORDER — PANTOPRAZOLE SODIUM 40 MG PO TBEC: 40.0000 mg | DELAYED_RELEASE_TABLET | Freq: Every day | ORAL | 3 refills | Status: DC

## 2018-06-20 NOTE — Patient Instructions (Signed)
Mr. Lando,  It was a pleasure seeing you today. Please follow up in one year.

## 2018-06-20 NOTE — Progress Notes (Signed)
   CC: Medication refill for GERD   HPI:  [name] is a 57 y.o. male with history noted below that presents to the Internal Medicine Clinic for medication refill for GERD   Past Medical History:  Diagnosis Date  . Chronic kidney disease   . History of chicken pox   . History of DVT of lower extremity    right  . HIV (human immunodeficiency virus infection) ([place])   . Hyperlipidemia   . Left hip pain 03/09/2013  . Pneumonia   . Protein malnutrition ([place]) 08/2015    Review of Systems:  Review of Systems  Constitutional: Negative for malaise/fatigue and weight loss.  Respiratory: Negative for shortness of breath.   Cardiovascular: Negative for chest pain.  Gastrointestinal: Negative for nausea and vomiting.     Physical Exam:  Vitals:   06/20/18 1320  BP: 123/72  Pulse: 97  Temp: 98.4 F (36.9 C)  TempSrc: Oral  SpO2: 97%  Weight: 129 lb 14.4 oz (58.9 kg)   Physical Exam  Constitutional: He is well-developed, well-nourished, and in no distress.  Cardiovascular: Normal rate, regular rhythm and normal heart sounds. Exam reveals no gallop and no friction rub.  No murmur heard. Pulmonary/Chest: Effort normal and breath sounds normal. No respiratory distress. He has no wheezes. He has no rales.     Assessment & Plan:   See encounters tab for problem based medical decision making.   Patient discussed with Dr. Beryle Beams

## 2018-06-22 NOTE — Assessment & Plan Note (Signed)
Assessment:  GERD Patient reports benefit from once a day pantoprazole to help with GERD symptoms.  Plan -refill pantoprazole

## 2018-06-22 NOTE — Assessment & Plan Note (Signed)
Assessment: Healthcare maintenance Influenza vaccine was offered and patient declined.

## 2018-06-24 ENCOUNTER — Ambulatory Visit (INDEPENDENT_AMBULATORY_CARE_PROVIDER_SITE_OTHER): Payer: Medicare Other | Admitting: Pharmacist

## 2018-06-24 DIAGNOSIS — Z7901 Long term (current) use of anticoagulants: Secondary | ICD-10-CM

## 2018-06-24 DIAGNOSIS — Z86718 Personal history of other venous thrombosis and embolism: Secondary | ICD-10-CM | POA: Diagnosis not present

## 2018-06-24 DIAGNOSIS — Z5181 Encounter for therapeutic drug level monitoring: Secondary | ICD-10-CM

## 2018-06-24 LAB — POCT INR: INR: 2.5 (ref 2.0–3.0)

## 2018-06-24 NOTE — Patient Instructions (Signed)
Patient instructed to take medications as defined in the Anti-coagulation Track section of this encounter.  Patient instructed to take today's dose.  Patient instructed to take 1 & 1/2 tablets of your peach-colored 5mg strength warfarin tablets Sundays, Tuesdays, Thursdays and Saturdays; on Mondays, Wednesdays and Fridays,  ONLY 1 tablet.  Patient verbalized understanding of these instructions.   

## 2018-06-24 NOTE — Progress Notes (Signed)
Medicine attending: Medical history, presenting problems, physical findings, and medications, reviewed with resident physician Dr Jessica Hoffman on the day of the patient visit and I concur with her evaluation and management plan. 

## 2018-06-24 NOTE — Progress Notes (Signed)
Anticoagulation Management [name] is a 57 y.o. male who reports to the clinic for monitoring of warfarin treatment.    Indication: DVT, History of; Long term current use of anticoagulants.  Duration: indefinite Supervising physician: Aldine Contes  Anticoagulation Clinic Visit History: Patient does not report signs/symptoms of bleeding or thromboembolism  Other recent changes: No diet, medications, lifestyle changes endorsed.  Anticoagulation Episode Summary    Current INR goal:   2.0-3.0  TTR:   70.5 % (7.4 y)  Next INR check:   08/05/2018  INR from last check:   2.5 (06/24/2018)  Weekly max warfarin dose:     Target end date:   Indefinite  INR check location:   Anticoagulation Clinic  Preferred lab:     Send INR reminders to:   [facility]   Indications   History of DVT (deep vein thrombosis) [Z86.718] Long term current use of anticoagulant [Z79.01]       Comments:         Anticoagulation Care Providers    Provider Role Specialty Phone number   Michel Bickers, MD  Infectious Diseases [phone]      Allergies  Allergen Reactions  . Dapsone     REACTION: fever  . Megestrol   . Sulfamethoxazole-Trimethoprim     REACTION: fever   Prior to Admission medications   Medication Sig Start Date End Date Taking? Authorizing Provider  acetaminophen (TYLENOL) 500 MG tablet Take 1 tablet (500 mg total) by mouth every 4 (four) hours as needed for mild pain, moderate pain or headache. 09/22/15  Yes Shela Leff, MD  darunavir-cobicistat ([name]) 800-150 MG tablet TAKE 1 TABLET BY MOUTH DAILY. SWALLOW WHOLE. DO NOT CRUSH, BREAK OR CHEW TABLETS. TAKE WITH FOOD. 06/11/18  Yes Michel Bickers, MD  dolutegravir ([name]) 50 MG tablet TAKE 1 TABLET (50 MG TOTAL) BY MOUTH DAILY. 06/11/18  Yes Michel Bickers, MD  ENSURE (ENSURE) Take 1 Can by mouth 4 (four) times daily - after meals and at bedtime. 01/31/18  Yes Michel Bickers, MD  gabapentin (NEURONTIN) 400 MG capsule TAKE 1  CAPSULE(400 MG) BY MOUTH FOUR TIMES DAILY 12/07/17  Yes Michel Bickers, MD  magnesium chloride (SLOW-MAG) 64 MG TBEC SR tablet Take 1 tablet (64 mg total) by mouth daily. 10/08/15  Yes Jule Ser, DO  morphine (MS CONTIN) 30 MG 12 hr tablet Take 3 tablets (90 mg total) by mouth every 8 (eight) hours. 06/06/18  Yes Michel Bickers, MD  Multiple Vitamin (MULTIVITAMIN WITH MINERALS) TABS tablet Take 1 tablet by mouth daily. 10/08/15  Yes Jule Ser, DO  oxyCODONE (OXY IR/ROXICODONE) 5 MG immediate release tablet Take 1 tablet (5 mg total) by mouth 2 (two) times daily as needed for severe pain. 06/06/18  Yes Michel Bickers, MD  pantoprazole (PROTONIX) 40 MG tablet Take 1 tablet (40 mg total) by mouth daily. 06/20/18  Yes Valinda Party, DO  phosphorus (K PHOS NEUTRAL) 155-852-130 MG tablet Take 3 tablets (750 mg total) by mouth 2 (two) times daily. 10/08/15  Yes Jule Ser, DO  potassium chloride SA (K-DUR,KLOR-CON) 20 MEQ tablet Take 1 tablet (20 mEq total) by mouth daily. 05/10/17  Yes Hoffman, Jessica Ratliff, DO  potassium citrate (UROCIT-K) 10 MEQ (1080 MG) SR tablet Take 4 tablets (40 mEq total) by mouth 2 (two) times daily with a meal. 09/22/15  Yes Shela Leff, MD  SANTYL ointment APPLY TOPICALLY TO THE AFFECTED AREA(S) DAILY 11/05/15  Yes Iline Oven, MD  sodium bicarbonate 650 MG tablet Take  2 tablets (1,300 mg total) by mouth 3 (three) times daily. 10/08/15  Yes Jule Ser, DO  Tenofovir Alafenamide Fumarate (VEMLIDY) 25 MG TABS Take 1 tablet (25 mg total) by mouth daily. 06/11/18  Yes Michel Bickers, MD  warfarin (COUMADIN) 5 MG tablet TAKE 1 AND 1/2 TABLETS BY MOUTH ON SUNDAYS, TUESDAYS, THURSDAYS, AND SATURDAYS, ALL OTHER DAYS TAKE ONLY 1 TABLET 05/27/18  Yes Jorene Guest B, RPH-CPP  warfarin (COUMADIN) 5 MG tablet TAKE 1 AND 1/2 TABLETS BY MOUTH ON SUNDAYS, TUESDAYS, THURSDAYS, AND SATURDAYS, ALL OTHER DAYS TAKE ONLY 1 TABLET 05/27/18  Yes [name], RPH-CPP    Past Medical History:  Diagnosis Date  . Chronic kidney disease   . History of chicken pox   . History of DVT of lower extremity    right  . HIV (human immunodeficiency virus infection) ([place])   . Hyperlipidemia   . Left hip pain 03/09/2013  . Pneumonia   . Protein malnutrition ([place]) 08/2015   Social History   Socioeconomic History  . Marital status: Widowed    Spouse name: Not on file  . Number of children: 3  . Years of education: Not on file  . Highest education level: Not on file  Occupational History    Employer: UNEMPLOYED  Social Needs  . Financial resource strain: Not on file  . Food insecurity:    Worry: Not on file    Inability: Not on file  . Transportation needs:    Medical: Not on file    Non-medical: Not on file  Tobacco Use  . Smoking status: Current Every Day Smoker    Packs/day: 0.50    Years: 36.00    Pack years: 18.00    Types: Cigarettes  . Smokeless tobacco: Never Used  . Tobacco comment: ABOUT 1/2PPD.  Slowing down  Substance and Sexual Activity  . Alcohol use: No    Alcohol/week: 0.0 standard drinks  . Drug use: No  . Sexual activity: Not Currently    Comment: declined condoms  Lifestyle  . Physical activity:    Days per week: Not on file    Minutes per session: Not on file  . Stress: Not on file  Relationships  . Social connections:    Talks on phone: Not on file    Gets together: Not on file    Attends religious service: Not on file    Active member of club or organization: Not on file    Attends meetings of clubs or organizations: Not on file    Relationship status: Not on file  Other Topics Concern  . Not on file  Social History Narrative  . Not on file   Family History  Problem Relation Age of Onset  . Arthritis Mother   . Kidney disease Maternal Grandfather     ASSESSMENT Recent Results: The most recent result is correlated with 45 mg per week: Lab Results  Component Value Date   INR 2.5 06/24/2018   INR 2.4  05/20/2018   INR 2.6 04/08/2018   PROTIME 38.4 (H) 04/01/2012    Anticoagulation Dosing: Description   Take 1 & 1/2 tablets of your peach-colored 5mg  strength warfarin tablets Sundays, Tuesdays, Thursdays and Saturdays; on Mondays, Wednesdays and Fridays,  ONLY 1 tablet.      INR today: Therapeutic  PLAN Weekly dose was unchanged.   Patient Instructions  Patient instructed to take medications as defined in the Anti-coagulation Track section of this encounter.  Patient instructed to take  today's dose.  Patient instructed to take 1 & 1/2 tablets of your peach-colored 5mg  strength warfarin tablets Sundays, Tuesdays, Thursdays and Saturdays; on Mondays, Wednesdays and Fridays,  ONLY 1 tablet.  Patient verbalized understanding of these instructions.     Patient advised to contact clinic or seek medical attention if signs/symptoms of bleeding or thromboembolism occur.  Patient verbalized understanding by repeating back information and was advised to contact me if further medication-related questions arise. Patient was also provided an information handout.  Follow-up Return in about 6 weeks (around 08/05/2018) for Follow up INR at 1030h.  [name], PharmD, CACP, CPP  15 minutes spent face-to-face with the patient during the encounter. 50% of time spent on education. 50% of time was spent on fingerstick point of care INR sample collection, processing, results determination, and documentation in [email or web address].

## 2018-06-27 NOTE — Progress Notes (Signed)
INTERNAL MEDICINE TEACHING ATTENDING ADDENDUM - Allyssa Abruzzese M.D  Duration- indefinite, Indication- DVT, INR- therapeutic. Agree with pharmacy recommendations as outlined in their note.     

## 2018-07-04 ENCOUNTER — Other Ambulatory Visit: Payer: Self-pay | Admitting: *Deleted

## 2018-07-04 DIAGNOSIS — G894 Chronic pain syndrome: Secondary | ICD-10-CM

## 2018-07-04 DIAGNOSIS — G546 Phantom limb syndrome with pain: Secondary | ICD-10-CM

## 2018-07-04 MED ORDER — MORPHINE SULFATE ER 30 MG PO TBCR: 90.0000 mg | EXTENDED_RELEASE_TABLET | Freq: Three times a day (TID) | ORAL | 0 refills | Status: DC

## 2018-07-04 MED ORDER — OXYCODONE HCL 5 MG PO TABS: 5.0000 mg | ORAL_TABLET | Freq: Two times a day (BID) | ORAL | 0 refills | Status: DC | PRN

## 2018-07-05 ENCOUNTER — Telehealth: Payer: Self-pay

## 2018-07-05 NOTE — Telephone Encounter (Signed)
Patient came into clinic to pickup scripts.  [place]

## 2018-07-05 NOTE — Telephone Encounter (Signed)
Patient call today regarding his scripts for his pain medication. Informed patient that scripts have been signed and are ready to pick-up. Patient will come to clinic today around 37. [place]

## 2018-08-05 ENCOUNTER — Telehealth: Payer: Self-pay | Admitting: *Deleted

## 2018-08-05 ENCOUNTER — Ambulatory Visit (INDEPENDENT_AMBULATORY_CARE_PROVIDER_SITE_OTHER): Payer: Medicare Other | Admitting: Pharmacist

## 2018-08-05 DIAGNOSIS — Z86718 Personal history of other venous thrombosis and embolism: Secondary | ICD-10-CM | POA: Diagnosis not present

## 2018-08-05 DIAGNOSIS — Z5181 Encounter for therapeutic drug level monitoring: Secondary | ICD-10-CM | POA: Diagnosis not present

## 2018-08-05 DIAGNOSIS — Z7901 Long term (current) use of anticoagulants: Secondary | ICD-10-CM | POA: Diagnosis not present

## 2018-08-05 DIAGNOSIS — G546 Phantom limb syndrome with pain: Secondary | ICD-10-CM

## 2018-08-05 DIAGNOSIS — G894 Chronic pain syndrome: Secondary | ICD-10-CM

## 2018-08-05 LAB — POCT INR: INR: 2.2 (ref 2.0–3.0)

## 2018-08-05 MED ORDER — OXYCODONE HCL 5 MG PO TABS: 5.0000 mg | ORAL_TABLET | Freq: Two times a day (BID) | ORAL | 0 refills | Status: DC | PRN

## 2018-08-05 MED ORDER — MORPHINE SULFATE ER 30 MG PO TBCR: 90.0000 mg | EXTENDED_RELEASE_TABLET | Freq: Three times a day (TID) | ORAL | 0 refills | Status: DC

## 2018-08-05 NOTE — Telephone Encounter (Signed)
Reprinted medication for Dr Megan Salon to sign. Please advise if ok to release prescription, or if you need any additional tests. [name], RN

## 2018-08-05 NOTE — Patient Instructions (Signed)
Patient instructed to take medications as defined in the Anti-coagulation Track section of this encounter.  Patient instructed to take today's dose.  Patient instructed to take 1 & 1/2 tablets of your peach-colored 5mg strength warfarin tablets Sundays, Tuesdays, Thursdays and Saturdays; on Mondays, Wednesdays and Fridays,  ONLY 1 tablet.  Patient verbalized understanding of these instructions.   

## 2018-08-05 NOTE — Progress Notes (Signed)
Anticoagulation Management [name] is a 57 y.o. male who reports to the clinic for monitoring of warfarin treatment.    Indication: History of DVT (deep vein thrombosis); Long term current use of anticoagulant.  Duration: indefinite Supervising physician: Aldine Contes  Anticoagulation Clinic Visit History: Patient does not report signs/symptoms of bleeding or thromboembolism  Other recent changes: No diet, medications, lifestyle changes endorsed by the patient.  Anticoagulation Episode Summary    Current INR goal:   2.0-3.0  TTR:   70.9 % (7.5 y)  Next INR check:   09/16/2018  INR from last check:   2.2 (08/05/2018)  Weekly max warfarin dose:     Target end date:   Indefinite  INR check location:   Anticoagulation Clinic  Preferred lab:     Send INR reminders to:   [facility]   Indications   History of DVT (deep vein thrombosis) [Z86.718] Long term current use of anticoagulant [Z79.01]       Comments:         Anticoagulation Care Providers    Provider Role Specialty Phone number   Michel Bickers, MD  Infectious Diseases [phone]      Allergies  Allergen Reactions  . Dapsone     REACTION: fever  . Megestrol   . Sulfamethoxazole-Trimethoprim     REACTION: fever   Prior to Admission medications   Medication Sig Start Date End Date Taking? Authorizing Provider  acetaminophen (TYLENOL) 500 MG tablet Take 1 tablet (500 mg total) by mouth every 4 (four) hours as needed for mild pain, moderate pain or headache. 09/22/15  Yes Shela Leff, MD  darunavir-cobicistat ([name]) 800-150 MG tablet TAKE 1 TABLET BY MOUTH DAILY. SWALLOW WHOLE. DO NOT CRUSH, BREAK OR CHEW TABLETS. TAKE WITH FOOD. 06/11/18  Yes Michel Bickers, MD  dolutegravir ([name]) 50 MG tablet TAKE 1 TABLET (50 MG TOTAL) BY MOUTH DAILY. 06/11/18  Yes Michel Bickers, MD  ENSURE (ENSURE) Take 1 Can by mouth 4 (four) times daily - after meals and at bedtime. 01/31/18  Yes Michel Bickers, MD   gabapentin (NEURONTIN) 400 MG capsule TAKE 1 CAPSULE(400 MG) BY MOUTH FOUR TIMES DAILY 12/07/17  Yes Michel Bickers, MD  magnesium chloride (SLOW-MAG) 64 MG TBEC SR tablet Take 1 tablet (64 mg total) by mouth daily. 10/08/15  Yes Jule Ser, DO  morphine (MS CONTIN) 30 MG 12 hr tablet Take 3 tablets (90 mg total) by mouth every 8 (eight) hours. 07/04/18  Yes Michel Bickers, MD  Multiple Vitamin (MULTIVITAMIN WITH MINERALS) TABS tablet Take 1 tablet by mouth daily. 10/08/15  Yes Jule Ser, DO  oxyCODONE (OXY IR/ROXICODONE) 5 MG immediate release tablet Take 1 tablet (5 mg total) by mouth 2 (two) times daily as needed for severe pain. 07/04/18  Yes Michel Bickers, MD  pantoprazole (PROTONIX) 40 MG tablet Take 1 tablet (40 mg total) by mouth daily. 06/20/18  Yes Valinda Party, DO  phosphorus (K PHOS NEUTRAL) 155-852-130 MG tablet Take 3 tablets (750 mg total) by mouth 2 (two) times daily. 10/08/15  Yes Jule Ser, DO  potassium chloride SA (K-DUR,KLOR-CON) 20 MEQ tablet Take 1 tablet (20 mEq total) by mouth daily. 05/10/17  Yes Hoffman, Jessica Ratliff, DO  potassium citrate (UROCIT-K) 10 MEQ (1080 MG) SR tablet Take 4 tablets (40 mEq total) by mouth 2 (two) times daily with a meal. 09/22/15  Yes Shela Leff, MD  SANTYL ointment APPLY TOPICALLY TO THE AFFECTED AREA(S) DAILY 11/05/15  Yes Iline Oven, MD  sodium bicarbonate 650 MG tablet Take 2 tablets (1,300 mg total) by mouth 3 (three) times daily. 10/08/15  Yes Jule Ser, DO  Tenofovir Alafenamide Fumarate (VEMLIDY) 25 MG TABS Take 1 tablet (25 mg total) by mouth daily. 06/11/18  Yes Michel Bickers, MD  warfarin (COUMADIN) 5 MG tablet TAKE 1 AND 1/2 TABLETS BY MOUTH ON SUNDAYS, TUESDAYS, THURSDAYS, AND SATURDAYS, ALL OTHER DAYS TAKE ONLY 1 TABLET 05/27/18  Yes Jorene Guest B, RPH-CPP  warfarin (COUMADIN) 5 MG tablet TAKE 1 AND 1/2 TABLETS BY MOUTH ON SUNDAYS, TUESDAYS, THURSDAYS, AND SATURDAYS, ALL OTHER DAYS TAKE ONLY 1  TABLET 05/27/18  Yes [name], RPH-CPP   Past Medical History:  Diagnosis Date  . Chronic kidney disease   . History of chicken pox   . History of DVT of lower extremity    right  . HIV (human immunodeficiency virus infection) ([place])   . Hyperlipidemia   . Left hip pain 03/09/2013  . Pneumonia   . Protein malnutrition ([place]) 08/2015   Social History   Socioeconomic History  . Marital status: Widowed    Spouse name: Not on file  . Number of children: 3  . Years of education: Not on file  . Highest education level: Not on file  Occupational History    Employer: UNEMPLOYED  Social Needs  . Financial resource strain: Not on file  . Food insecurity:    Worry: Not on file    Inability: Not on file  . Transportation needs:    Medical: Not on file    Non-medical: Not on file  Tobacco Use  . Smoking status: Current Every Day Smoker    Packs/day: 0.50    Years: 36.00    Pack years: 18.00    Types: Cigarettes  . Smokeless tobacco: Never Used  . Tobacco comment: ABOUT 1/2PPD.  Slowing down  Substance and Sexual Activity  . Alcohol use: No    Alcohol/week: 0.0 standard drinks  . Drug use: No  . Sexual activity: Not Currently    Comment: declined condoms  Lifestyle  . Physical activity:    Days per week: Not on file    Minutes per session: Not on file  . Stress: Not on file  Relationships  . Social connections:    Talks on phone: Not on file    Gets together: Not on file    Attends religious service: Not on file    Active member of club or organization: Not on file    Attends meetings of clubs or organizations: Not on file    Relationship status: Not on file  Other Topics Concern  . Not on file  Social History Narrative  . Not on file   Family History  Problem Relation Age of Onset  . Arthritis Mother   . Kidney disease Maternal Grandfather     ASSESSMENT Recent Results: The most recent result is correlated with 45 mg per week: Lab Results  Component  Value Date   INR 2.2 08/05/2018   INR 2.5 06/24/2018   INR 2.4 05/20/2018   PROTIME 38.4 (H) 04/01/2012    Anticoagulation Dosing: Description   Take 1 & 1/2 tablets of your peach-colored 5mg  strength warfarin tablets Sundays, Tuesdays, Thursdays and Saturdays; on Mondays, Wednesdays and Fridays,  ONLY 1 tablet.      INR today: Therapeutic  PLAN Weekly dose was unchanged.   Patient Instructions  Patient instructed to take medications as defined in the Anti-coagulation Track section of this  encounter.  Patient instructed to take today's dose.  Patient instructed to take 1 & 1/2 tablets of your peach-colored 5mg  strength warfarin tablets Sundays, Tuesdays, Thursdays and Saturdays; on Mondays, Wednesdays and Fridays,  ONLY 1 tablet.  Patient verbalized understanding of these instructions.     Patient advised to contact clinic or seek medical attention if signs/symptoms of bleeding or thromboembolism occur.  Patient verbalized understanding by repeating back information and was advised to contact me if further medication-related questions arise. Patient was also provided an information handout.  Follow-up Return in 6 weeks (on 09/16/2018) for Follow up INR at 1000h.  [name], PharmD, CACP, CPP  15 minutes spent face-to-face with the patient during the encounter. 50% of time spent on education. 50% of time was spent on fingerstick point of care INR sample collection, processing, results determination, and documentation in [email or web address]

## 2018-08-05 NOTE — Progress Notes (Signed)
INTERNAL MEDICINE TEACHING ATTENDING ADDENDUM - Ginnie Marich M.D  Duration- indefinite, Indication- DVT, INR- therapeutic. Agree with pharmacy recommendations as outlined in their note.     

## 2018-08-05 NOTE — Telephone Encounter (Signed)
-----   Message from Aundria Rud, [place] sent at 08/05/2018 10:37 AM EDT ----- Patient would like script for pain meds printed and signed for tomorrow. Will call before arriving at clinic

## 2018-09-04 ENCOUNTER — Other Ambulatory Visit: Payer: Self-pay | Admitting: *Deleted

## 2018-09-04 DIAGNOSIS — G894 Chronic pain syndrome: Secondary | ICD-10-CM

## 2018-09-04 DIAGNOSIS — G546 Phantom limb syndrome with pain: Secondary | ICD-10-CM

## 2018-09-04 MED ORDER — MORPHINE SULFATE ER 30 MG PO TBCR: 90.0000 mg | EXTENDED_RELEASE_TABLET | Freq: Three times a day (TID) | ORAL | 0 refills | Status: DC

## 2018-09-04 MED ORDER — OXYCODONE HCL 5 MG PO TABS: 5.0000 mg | ORAL_TABLET | Freq: Two times a day (BID) | ORAL | 0 refills | Status: DC | PRN

## 2018-09-16 ENCOUNTER — Ambulatory Visit (INDEPENDENT_AMBULATORY_CARE_PROVIDER_SITE_OTHER): Payer: Medicare Other | Admitting: Pharmacist

## 2018-09-16 ENCOUNTER — Encounter (INDEPENDENT_AMBULATORY_CARE_PROVIDER_SITE_OTHER): Payer: Self-pay

## 2018-09-16 DIAGNOSIS — Z5181 Encounter for therapeutic drug level monitoring: Secondary | ICD-10-CM

## 2018-09-16 DIAGNOSIS — Z86718 Personal history of other venous thrombosis and embolism: Secondary | ICD-10-CM

## 2018-09-16 DIAGNOSIS — Z7901 Long term (current) use of anticoagulants: Secondary | ICD-10-CM

## 2018-09-16 LAB — POCT INR: INR: 2.5 (ref 2.0–3.0)

## 2018-09-16 NOTE — Progress Notes (Signed)
Anticoagulation Management [name] is a 57 y.o. male who reports to the clinic for monitoring of warfarin treatment.    Indication: DVT and long-term current use of anticoagulants  Duration: indefinite Supervising physician: Gilles Chiquito  Anticoagulation Clinic Visit History: Patient does not report signs/symptoms of bleeding or thromboembolism at this time. Other recent changes: No diet, medications, lifestyle changes endorsed by patient. Anticoagulation Episode Summary    Current INR goal:   2.0-3.0  TTR:   71.3 % (7.6 y)  Next INR check:   10/28/2018  INR from last check:   2.5 (09/16/2018)  Weekly max warfarin dose:     Target end date:   Indefinite  INR check location:   Anticoagulation Clinic  Preferred lab:     Send INR reminders to:   [facility]   Indications   History of DVT (deep vein thrombosis) [Z86.718] Long term current use of anticoagulant [Z79.01]       Comments:         Anticoagulation Care Providers    Provider Role Specialty Phone number   Michel Bickers, MD  Infectious Diseases [phone]      Allergies  Allergen Reactions  . Dapsone     REACTION: fever  . Megestrol   . Sulfamethoxazole-Trimethoprim     REACTION: fever   Prior to Admission medications   Medication Sig Start Date End Date Taking? Authorizing Provider  acetaminophen (TYLENOL) 500 MG tablet Take 1 tablet (500 mg total) by mouth every 4 (four) hours as needed for mild pain, moderate pain or headache. 09/22/15  Yes Shela Leff, MD  darunavir-cobicistat ([name]) 800-150 MG tablet TAKE 1 TABLET BY MOUTH DAILY. SWALLOW WHOLE. DO NOT CRUSH, BREAK OR CHEW TABLETS. TAKE WITH FOOD. 06/11/18  Yes Michel Bickers, MD  dolutegravir ([name]) 50 MG tablet TAKE 1 TABLET (50 MG TOTAL) BY MOUTH DAILY. 06/11/18  Yes Michel Bickers, MD  ENSURE (ENSURE) Take 1 Can by mouth 4 (four) times daily - after meals and at bedtime. 01/31/18  Yes Michel Bickers, MD  gabapentin (NEURONTIN) 400 MG  capsule TAKE 1 CAPSULE(400 MG) BY MOUTH FOUR TIMES DAILY 12/07/17  Yes Michel Bickers, MD  magnesium chloride (SLOW-MAG) 64 MG TBEC SR tablet Take 1 tablet (64 mg total) by mouth daily. 10/08/15  Yes Jule Ser, DO  morphine (MS CONTIN) 30 MG 12 hr tablet Take 3 tablets (90 mg total) by mouth every 8 (eight) hours. 09/04/18  Yes Michel Bickers, MD  Multiple Vitamin (MULTIVITAMIN WITH MINERALS) TABS tablet Take 1 tablet by mouth daily. 10/08/15  Yes Jule Ser, DO  oxyCODONE (OXY IR/ROXICODONE) 5 MG immediate release tablet Take 1 tablet (5 mg total) by mouth 2 (two) times daily as needed for severe pain. 09/04/18  Yes Michel Bickers, MD  pantoprazole (PROTONIX) 40 MG tablet Take 1 tablet (40 mg total) by mouth daily. 06/20/18  Yes Valinda Party, DO  phosphorus (K PHOS NEUTRAL) 155-852-130 MG tablet Take 3 tablets (750 mg total) by mouth 2 (two) times daily. 10/08/15  Yes Jule Ser, DO  potassium chloride SA (K-DUR,KLOR-CON) 20 MEQ tablet Take 1 tablet (20 mEq total) by mouth daily. 05/10/17  Yes Hoffman, Jessica Ratliff, DO  potassium citrate (UROCIT-K) 10 MEQ (1080 MG) SR tablet Take 4 tablets (40 mEq total) by mouth 2 (two) times daily with a meal. 09/22/15  Yes Shela Leff, MD  SANTYL ointment APPLY TOPICALLY TO THE AFFECTED AREA(S) DAILY 11/05/15  Yes Iline Oven, MD  sodium bicarbonate 650 MG tablet  Take 2 tablets (1,300 mg total) by mouth 3 (three) times daily. 10/08/15  Yes Jule Ser, DO  Tenofovir Alafenamide Fumarate (VEMLIDY) 25 MG TABS Take 1 tablet (25 mg total) by mouth daily. 06/11/18  Yes Michel Bickers, MD  warfarin (COUMADIN) 5 MG tablet TAKE 1 AND 1/2 TABLETS BY MOUTH ON SUNDAYS, TUESDAYS, THURSDAYS, AND SATURDAYS, ALL OTHER DAYS TAKE ONLY 1 TABLET 05/27/18  Yes Jorene Guest B, RPH-CPP  warfarin (COUMADIN) 5 MG tablet TAKE 1 AND 1/2 TABLETS BY MOUTH ON SUNDAYS, TUESDAYS, THURSDAYS, AND SATURDAYS, ALL OTHER DAYS TAKE ONLY 1 TABLET 05/27/18  Yes [name], RPH-CPP   Past Medical History:  Diagnosis Date  . Chronic kidney disease   . History of chicken pox   . History of DVT of lower extremity    right  . HIV (human immunodeficiency virus infection) ([place])   . Hyperlipidemia   . Left hip pain 03/09/2013  . Pneumonia   . Protein malnutrition ([place]) 08/2015   Social History   Socioeconomic History  . Marital status: Widowed    Spouse name: Not on file  . Number of children: 3  . Years of education: Not on file  . Highest education level: Not on file  Occupational History    Employer: UNEMPLOYED  Social Needs  . Financial resource strain: Not on file  . Food insecurity:    Worry: Not on file    Inability: Not on file  . Transportation needs:    Medical: Not on file    Non-medical: Not on file  Tobacco Use  . Smoking status: Current Every Day Smoker    Packs/day: 0.50    Years: 36.00    Pack years: 18.00    Types: Cigarettes  . Smokeless tobacco: Never Used  . Tobacco comment: ABOUT 1/2PPD.  Slowing down  Substance and Sexual Activity  . Alcohol use: No    Alcohol/week: 0.0 standard drinks  . Drug use: No  . Sexual activity: Not Currently    Comment: declined condoms  Lifestyle  . Physical activity:    Days per week: Not on file    Minutes per session: Not on file  . Stress: Not on file  Relationships  . Social connections:    Talks on phone: Not on file    Gets together: Not on file    Attends religious service: Not on file    Active member of club or organization: Not on file    Attends meetings of clubs or organizations: Not on file    Relationship status: Not on file  Other Topics Concern  . Not on file  Social History Narrative  . Not on file   Family History  Problem Relation Age of Onset  . Arthritis Mother   . Kidney disease Maternal Grandfather     ASSESSMENT Recent Results: The most recent result is correlated with 45 mg per week: Lab Results  Component Value Date   INR 2.5  09/16/2018   INR 2.2 08/05/2018   INR 2.5 06/24/2018   PROTIME 38.4 (H) 04/01/2012    Anticoagulation Dosing: Description   Take 1 & 1/2 tablets of your peach-colored 5mg  strength warfarin tablets Sundays, Tuesdays, Thursdays and Saturdays; on Mondays, Wednesdays and Fridays,  ONLY 1 tablet.      INR today: Therapeutic  PLAN Weekly dose was unchanged.  Patient Instructions  Patient instructed to take medications as defined in the Anti-coagulation Track section of this encounter.  Patient instructed to take  today's dose.  Patient instructed to take 1 & 1/2 tablets of your peach-colored 5mg  strength warfarin tablets Sundays, Tuesdays, Thursdays and Saturdays; on Mondays, Wednesdays and Fridays, ONLY 1 tablet.  Patient verbalized understanding of these instructions.    Patient advised to contact clinic or seek medical attention if signs/symptoms of bleeding or thromboembolism occur.  Patient verbalized understanding by repeating back information and was advised to contact me if further medication-related questions arise. Patient was also provided an information handout.  Follow-up Return in about 6 weeks (around 10/28/2018) for INR follow up at 1000.  15 minutes spent face-to-face with the patient during the encounter. 50% of time spent on education. 50% of time was spent on point of care INR testing, sample collection, result interpretation, and documentation in [email or web address]  Thank you for allowing pharmacy to be a part of this patient's care.  Leron Croak, PharmD PGY1 Pharmacy Resident Phone: [phone]

## 2018-09-16 NOTE — Patient Instructions (Signed)
Patient instructed to take medications as defined in the Anti-coagulation Track section of this encounter.  Patient instructed to take today's dose.  Patient instructed to take 1 & 1/2 tablets of your peach-colored 5mg strength warfarin tablets Sundays, Tuesdays, Thursdays and Saturdays; on Mondays, Wednesdays and Fridays,  ONLY 1 tablet.  Patient verbalized understanding of these instructions.   

## 2018-09-16 NOTE — Progress Notes (Signed)
Patient was seen in clinic with Dr. Fanning, PharmD, PGY1 pharmacy resident. I agree with the assessment and plan of care documented.   

## 2018-09-23 ENCOUNTER — Ambulatory Visit: Payer: Medicare Other

## 2018-09-25 NOTE — Progress Notes (Signed)
I reviewed the anticoagulation clinic note.  Patient is on coumadin for VTE and INR was at goal.

## 2018-10-01 ENCOUNTER — Other Ambulatory Visit: Payer: Self-pay | Admitting: *Deleted

## 2018-10-01 DIAGNOSIS — G546 Phantom limb syndrome with pain: Secondary | ICD-10-CM

## 2018-10-01 DIAGNOSIS — G894 Chronic pain syndrome: Secondary | ICD-10-CM

## 2018-10-01 MED ORDER — MORPHINE SULFATE ER 30 MG PO TBCR: 90.0000 mg | EXTENDED_RELEASE_TABLET | Freq: Three times a day (TID) | ORAL | 0 refills | Status: DC

## 2018-10-01 MED ORDER — OXYCODONE HCL 5 MG PO TABS: 5.0000 mg | ORAL_TABLET | Freq: Two times a day (BID) | ORAL | 0 refills | Status: DC | PRN

## 2018-10-10 ENCOUNTER — Other Ambulatory Visit: Payer: Self-pay | Admitting: Internal Medicine

## 2018-10-10 DIAGNOSIS — G629 Polyneuropathy, unspecified: Secondary | ICD-10-CM

## 2018-10-28 ENCOUNTER — Ambulatory Visit (INDEPENDENT_AMBULATORY_CARE_PROVIDER_SITE_OTHER): Payer: Medicare Other

## 2018-10-28 DIAGNOSIS — Z7901 Long term (current) use of anticoagulants: Secondary | ICD-10-CM

## 2018-10-28 DIAGNOSIS — Z86718 Personal history of other venous thrombosis and embolism: Secondary | ICD-10-CM | POA: Diagnosis not present

## 2018-10-28 DIAGNOSIS — Z5181 Encounter for therapeutic drug level monitoring: Secondary | ICD-10-CM | POA: Diagnosis not present

## 2018-10-28 LAB — POCT INR: INR: 2.7 (ref 2.0–3.0)

## 2018-10-28 NOTE — Patient Instructions (Signed)
Patient instructed to take medications as defined in the Anti-coagulation Track section of this encounter.  Patient instructed to take today's dose.  Patient instructed to take 1 & 1/2 tablets of your peach-colored 5mg strength warfarin tablets Sundays, Tuesdays, Thursdays and Saturdays; on Mondays, Wednesdays and Fridays,  ONLY 1 tablet.  Patient verbalized understanding of these instructions.   

## 2018-10-28 NOTE — Progress Notes (Signed)
Anticoagulation Management [name] is a 57 y.o. male who reports to the clinic for monitoring of warfarin treatment.    Indication: History of DVT; long term use of anticoagulation  Duration: indefinite Supervising physician: Joni Reining  Anticoagulation Clinic Visit History: Patient does not report signs/symptoms of bleeding or thromboembolism  Other recent changes: No diet, medications, lifestyle changes reported by pt Anticoagulation Episode Summary    Current INR goal:   2.0-3.0  TTR:   71.8 % (7.7 y)  Next INR check:   12/09/2018  INR from last check:   2.7 (10/28/2018)  Weekly max warfarin dose:     Target end date:   Indefinite  INR check location:   Anticoagulation Clinic  Preferred lab:     Send INR reminders to:   [facility]   Indications   History of DVT (deep vein thrombosis) [Z86.718] Long term current use of anticoagulant [Z79.01]       Comments:         Anticoagulation Care Providers    Provider Role Specialty Phone number   Michel Bickers, MD  Infectious Diseases [phone]      Allergies  Allergen Reactions  . Dapsone     REACTION: fever  . Megestrol   . Sulfamethoxazole-Trimethoprim     REACTION: fever   Prior to Admission medications   Medication Sig Start Date End Date Taking? Authorizing Provider  acetaminophen (TYLENOL) 500 MG tablet Take 1 tablet (500 mg total) by mouth every 4 (four) hours as needed for mild pain, moderate pain or headache. 09/22/15   Shela Leff, MD  darunavir-cobicistat ([name]) 800-150 MG tablet TAKE 1 TABLET BY MOUTH DAILY. SWALLOW WHOLE. DO NOT CRUSH, BREAK OR CHEW TABLETS. TAKE WITH FOOD. 06/11/18   Michel Bickers, MD  dolutegravir ([name]) 50 MG tablet TAKE 1 TABLET (50 MG TOTAL) BY MOUTH DAILY. 06/11/18   Michel Bickers, MD  ENSURE (ENSURE) Take 1 Can by mouth 4 (four) times daily - after meals and at bedtime. 01/31/18   Michel Bickers, MD  gabapentin (NEURONTIN) 400 MG capsule TAKE 1 CAPSULE(400 MG) BY  MOUTH FOUR TIMES DAILY 10/10/18   Michel Bickers, MD  magnesium chloride (SLOW-MAG) 64 MG TBEC SR tablet Take 1 tablet (64 mg total) by mouth daily. 10/08/15   Jule Ser, DO  morphine (MS CONTIN) 30 MG 12 hr tablet Take 3 tablets (90 mg total) by mouth every 8 (eight) hours. 10/01/18   Michel Bickers, MD  Multiple Vitamin (MULTIVITAMIN WITH MINERALS) TABS tablet Take 1 tablet by mouth daily. 10/08/15   Jule Ser, DO  oxyCODONE (OXY IR/ROXICODONE) 5 MG immediate release tablet Take 1 tablet (5 mg total) by mouth 2 (two) times daily as needed for severe pain. 10/01/18   Michel Bickers, MD  pantoprazole (PROTONIX) 40 MG tablet Take 1 tablet (40 mg total) by mouth daily. 06/20/18   Valinda Party, DO  phosphorus (K PHOS NEUTRAL) 155-852-130 MG tablet Take 3 tablets (750 mg total) by mouth 2 (two) times daily. 10/08/15   Jule Ser, DO  potassium chloride SA (K-DUR,KLOR-CON) 20 MEQ tablet Take 1 tablet (20 mEq total) by mouth daily. 05/10/17   Kalman Shan Ratliff, DO  potassium citrate (UROCIT-K) 10 MEQ (1080 MG) SR tablet Take 4 tablets (40 mEq total) by mouth 2 (two) times daily with a meal. 09/22/15   Shela Leff, MD  SANTYL ointment APPLY TOPICALLY TO THE AFFECTED AREA(S) DAILY 11/05/15   Iline Oven, MD  sodium bicarbonate 650 MG tablet Take  2 tablets (1,300 mg total) by mouth 3 (three) times daily. 10/08/15   Jule Ser, DO  Tenofovir Alafenamide Fumarate (VEMLIDY) 25 MG TABS Take 1 tablet (25 mg total) by mouth daily. 06/11/18   Michel Bickers, MD  warfarin (COUMADIN) 5 MG tablet TAKE 1 AND 1/2 TABLETS BY MOUTH ON SUNDAYS, TUESDAYS, THURSDAYS, AND SATURDAYS, ALL OTHER DAYS TAKE ONLY 1 TABLET 05/27/18   [name], RPH-CPP  warfarin (COUMADIN) 5 MG tablet TAKE 1 AND 1/2 TABLETS BY MOUTH ON SUNDAYS, TUESDAYS, THURSDAYS, AND SATURDAYS, ALL OTHER DAYS TAKE ONLY 1 TABLET 05/27/18   [name], RPH-CPP   Past Medical History:  Diagnosis Date  . Chronic  kidney disease   . History of chicken pox   . History of DVT of lower extremity    right  . HIV (human immunodeficiency virus infection) ([place])   . Hyperlipidemia   . Left hip pain 03/09/2013  . Pneumonia   . Protein malnutrition ([place]) 08/2015   Social History   Socioeconomic History  . Marital status: Widowed    Spouse name: Not on file  . Number of children: 3  . Years of education: Not on file  . Highest education level: Not on file  Occupational History    Employer: UNEMPLOYED  Social Needs  . Financial resource strain: Not on file  . Food insecurity:    Worry: Not on file    Inability: Not on file  . Transportation needs:    Medical: Not on file    Non-medical: Not on file  Tobacco Use  . Smoking status: Current Every Day Smoker    Packs/day: 0.50    Years: 36.00    Pack years: 18.00    Types: Cigarettes  . Smokeless tobacco: Never Used  . Tobacco comment: ABOUT 1/2PPD.  Slowing down  Substance and Sexual Activity  . Alcohol use: No    Alcohol/week: 0.0 standard drinks  . Drug use: No  . Sexual activity: Not Currently    Comment: declined condoms  Lifestyle  . Physical activity:    Days per week: Not on file    Minutes per session: Not on file  . Stress: Not on file  Relationships  . Social connections:    Talks on phone: Not on file    Gets together: Not on file    Attends religious service: Not on file    Active member of club or organization: Not on file    Attends meetings of clubs or organizations: Not on file    Relationship status: Not on file  Other Topics Concern  . Not on file  Social History Narrative  . Not on file   Family History  Problem Relation Age of Onset  . Arthritis Mother   . Kidney disease Maternal Grandfather     ASSESSMENT Recent Results: The most recent result is correlated with 45 mg per week: Lab Results  Component Value Date   INR 2.7 10/28/2018   INR 2.5 09/16/2018   INR 2.2 08/05/2018   PROTIME 38.4 (H)  04/01/2012    Anticoagulation Dosing: Description   Take 1 & 1/2 tablets of your peach-colored 5mg  strength warfarin tablets Sundays, Tuesdays, Thursdays and Saturdays; on Mondays, Wednesdays and Fridays,  ONLY 1 tablet.      INR today: Therapeutic  PLAN Weekly dose was unchanged   Patient Instructions  Patient instructed to take medications as defined in the Anti-coagulation Track section of this encounter.  Patient instructed to take  today's dose.  Patient instructed to take 1 & 1/2 tablets of your peach-colored 5mg  strength warfarin tablets Sundays, Tuesdays, Thursdays and Saturdays; on Mondays, Wednesdays and Fridays,  ONLY 1 tablet.  Patient verbalized understanding of these instructions.    Patient advised to contact clinic or seek medical attention if signs/symptoms of bleeding or thromboembolism occur.  Patient verbalized understanding by repeating back information and was advised to contact me if further medication-related questions arise. Patient was also provided an information handout.  Follow-up Return in about 6 weeks (around 12/09/2018) for @ 1000 for INR F/u.  Juanell Fairly, PharmD PGY1 Pharmacy Resident Phone [phone] 10/28/2018 9:40 AM  15 minutes spent face-to-face with the patient during the encounter. 50% of time spent on education. 50% of time was spent on INR POC sample collection, results determination, documentation in [email or web address] and [facility].

## 2018-11-05 ENCOUNTER — Telehealth: Payer: Self-pay | Admitting: Behavioral Health

## 2018-11-05 ENCOUNTER — Other Ambulatory Visit: Payer: Self-pay | Admitting: Behavioral Health

## 2018-11-05 DIAGNOSIS — G894 Chronic pain syndrome: Secondary | ICD-10-CM

## 2018-11-05 DIAGNOSIS — G546 Phantom limb syndrome with pain: Secondary | ICD-10-CM

## 2018-11-05 MED ORDER — MORPHINE SULFATE ER 30 MG PO TBCR: 90.0000 mg | EXTENDED_RELEASE_TABLET | Freq: Three times a day (TID) | ORAL | 0 refills | Status: DC

## 2018-11-05 MED ORDER — OXYCODONE HCL 5 MG PO TABS: 5.0000 mg | ORAL_TABLET | Freq: Two times a day (BID) | ORAL | 0 refills | Status: DC | PRN

## 2018-11-05 NOTE — Telephone Encounter (Signed)
Patient called requesting  pain medication prescription (Ms Contin and oxycodone) .  Patient will pick up prescription on Thursday.  Dr. Johnnye Sima Signed RX and signed prescription is in triage awaiting Mr. Eastman to pick up. Pricilla Riffle RN

## 2018-11-07 ENCOUNTER — Ambulatory Visit (INDEPENDENT_AMBULATORY_CARE_PROVIDER_SITE_OTHER): Payer: Medicare Other | Admitting: Behavioral Health

## 2018-11-07 DIAGNOSIS — Z23 Encounter for immunization: Secondary | ICD-10-CM | POA: Diagnosis not present

## 2018-11-07 NOTE — Telephone Encounter (Signed)
Patient came in today to pick up pain medication prescription.  Prescription given to patient.  Patient also brought in a jury duty form and states he needs a letter from Dr. Megan Salon for exemption.  Patient states he is unable to sit for long periods of time due to his disability. Original form placed in Dr. Hale Bogus box, copy placed in triage and copy given to the patient. Pricilla Riffle RN

## 2018-11-14 ENCOUNTER — Encounter: Payer: Self-pay | Admitting: Internal Medicine

## 2018-11-17 ENCOUNTER — Other Ambulatory Visit: Payer: Self-pay | Admitting: Pharmacist

## 2018-11-19 ENCOUNTER — Other Ambulatory Visit: Payer: Self-pay

## 2018-11-19 ENCOUNTER — Other Ambulatory Visit: Payer: Self-pay | Admitting: Pharmacist

## 2018-11-19 DIAGNOSIS — R634 Abnormal weight loss: Secondary | ICD-10-CM

## 2018-11-19 MED ORDER — ENSURE PO LIQD: 1.0000 | Freq: Three times a day (TID) | ORAL | 11 refills | Status: DC

## 2018-11-25 ENCOUNTER — Telehealth: Payer: Self-pay

## 2018-11-25 ENCOUNTER — Other Ambulatory Visit: Payer: Medicare Other

## 2018-11-25 DIAGNOSIS — B20 Human immunodeficiency virus [HIV] disease: Secondary | ICD-10-CM

## 2018-11-25 NOTE — Telephone Encounter (Signed)
Signed Cleopatra Cedar letter and original letter sent to [facility] Marveen Reeks today 11/25/2018 for Mr. Wieck through the mail.  Mr. Mousseau was made aware and a copy was kept just in case. Pricilla Riffle RN

## 2018-11-25 NOTE — Telephone Encounter (Signed)
Mailed Jury duty letter 11/25/2018.   Pending scan.

## 2018-11-26 LAB — T-HELPER CELL (CD4) - (RCID CLINIC ONLY)
CD4 % Helper T Cell: 20 % — ABNORMAL LOW (ref 33–55)
CD4 T Cell Abs: 610 /uL (ref 400–2700)

## 2018-11-27 LAB — COMPREHENSIVE METABOLIC PANEL WITH GFR
AG Ratio: 1.2 (calc) (ref 1.0–2.5)
ALT: 16 U/L (ref 9–46)
AST: 18 U/L (ref 10–35)
Albumin: 4.6 g/dL (ref 3.6–5.1)
Alkaline phosphatase (APISO): 93 U/L (ref 40–115)
BUN: 11 mg/dL (ref 7–25)
CO2: 30 mmol/L (ref 20–32)
Calcium: 9.9 mg/dL (ref 8.6–10.3)
Chloride: 98 mmol/L (ref 98–110)
Creat: 1.24 mg/dL (ref 0.70–1.33)
Globulin: 3.7 g/dL (ref 1.9–3.7)
Glucose, Bld: 160 mg/dL — ABNORMAL HIGH (ref 65–99)
Potassium: 3.7 mmol/L (ref 3.5–5.3)
Sodium: 138 mmol/L (ref 135–146)
Total Bilirubin: 0.7 mg/dL (ref 0.2–1.2)
Total Protein: 8.3 g/dL — ABNORMAL HIGH (ref 6.1–8.1)

## 2018-11-27 LAB — CBC

## 2018-11-27 LAB — HIV-1 RNA QUANT-NO REFLEX-BLD
HIV 1 RNA Quant: <20 copies/mL
HIV-1 RNA Quant, Log: <1.3 Log copies/mL

## 2018-11-27 LAB — RPR: RPR Ser Ql: NONREACTIVE

## 2018-12-02 NOTE — Telephone Encounter (Signed)
Called the [employer] and patient has been excused from [employer].  Called patient to inform him of the outcome.  Patient verbalized understanding. Pricilla Riffle RN

## 2018-12-05 ENCOUNTER — Other Ambulatory Visit: Payer: Self-pay

## 2018-12-05 DIAGNOSIS — G546 Phantom limb syndrome with pain: Secondary | ICD-10-CM

## 2018-12-05 DIAGNOSIS — G894 Chronic pain syndrome: Secondary | ICD-10-CM

## 2018-12-05 MED ORDER — MORPHINE SULFATE ER 30 MG PO TBCR: 90.0000 mg | EXTENDED_RELEASE_TABLET | Freq: Three times a day (TID) | ORAL | 0 refills | Status: DC

## 2018-12-05 MED ORDER — OXYCODONE HCL 5 MG PO TABS: 5.0000 mg | ORAL_TABLET | Freq: Two times a day (BID) | ORAL | 0 refills | Status: DC | PRN

## 2018-12-05 NOTE — Progress Notes (Signed)
Dr. Linus Salmons agreed to sign patient's scripts for Morphine  and OxyCodone 5 mg IR. Scripts signed and available for patient pick up 12/06/18

## 2018-12-09 ENCOUNTER — Ambulatory Visit (INDEPENDENT_AMBULATORY_CARE_PROVIDER_SITE_OTHER): Payer: Medicare Other | Admitting: Internal Medicine

## 2018-12-09 ENCOUNTER — Encounter: Payer: Self-pay | Admitting: Pharmacist

## 2018-12-09 ENCOUNTER — Ambulatory Visit (INDEPENDENT_AMBULATORY_CARE_PROVIDER_SITE_OTHER): Payer: Medicare Other | Admitting: Pharmacist

## 2018-12-09 ENCOUNTER — Encounter: Payer: Self-pay | Admitting: Internal Medicine

## 2018-12-09 DIAGNOSIS — F17200 Nicotine dependence, unspecified, uncomplicated: Secondary | ICD-10-CM

## 2018-12-09 DIAGNOSIS — Z86718 Personal history of other venous thrombosis and embolism: Secondary | ICD-10-CM | POA: Diagnosis not present

## 2018-12-09 DIAGNOSIS — Z5181 Encounter for therapeutic drug level monitoring: Secondary | ICD-10-CM

## 2018-12-09 DIAGNOSIS — F1010 Alcohol abuse, uncomplicated: Secondary | ICD-10-CM

## 2018-12-09 DIAGNOSIS — B20 Human immunodeficiency virus [HIV] disease: Secondary | ICD-10-CM

## 2018-12-09 DIAGNOSIS — Z7901 Long term (current) use of anticoagulants: Secondary | ICD-10-CM

## 2018-12-09 DIAGNOSIS — G546 Phantom limb syndrome with pain: Secondary | ICD-10-CM

## 2018-12-09 LAB — POCT INR: INR: 3.4 — AB (ref 2.0–3.0)

## 2018-12-09 NOTE — Progress Notes (Signed)
Anticoagulation Management [name] is a 58 y.o. male who reports to the clinic for monitoring of warfarin treatment.    Indication: DVT, History of; Long term current use of anticoagulant.   Duration: indefinite Supervising physician: Aldine Contes  Anticoagulation Clinic Visit History: Patient does not report signs/symptoms of bleeding or thromboembolism  Other recent changes: No diet, medications, lifestyle changes. Anticoagulation Episode Summary    Current INR goal:   2.0-3.0  TTR:   71.3 % (7.9 y)  Next INR check:   01/20/2019  INR from last check:   3.4! (12/09/2018)  Weekly max warfarin dose:     Target end date:   Indefinite  INR check location:   Anticoagulation Clinic  Preferred lab:     Send INR reminders to:   [facility]   Indications   History of DVT (deep vein thrombosis) [Z86.718] Long term current use of anticoagulant [Z79.01]       Comments:         Anticoagulation Care Providers    Provider Role Specialty Phone number   Michel Bickers, MD  Infectious Diseases [phone]      Allergies  Allergen Reactions  . Dapsone     REACTION: fever  . Megestrol   . Sulfamethoxazole-Trimethoprim     REACTION: fever   Prior to Admission medications   Medication Sig Start Date End Date Taking? Authorizing Provider  darunavir-cobicistat ([name]) 800-150 MG tablet TAKE 1 TABLET BY MOUTH DAILY. SWALLOW WHOLE. DO NOT CRUSH, BREAK OR CHEW TABLETS. TAKE WITH FOOD. 06/11/18  Yes Michel Bickers, MD  dolutegravir ([name]) 50 MG tablet TAKE 1 TABLET (50 MG TOTAL) BY MOUTH DAILY. 06/11/18  Yes Michel Bickers, MD  ENSURE (ENSURE) Take 1 Can by mouth 4 (four) times daily - after meals and at bedtime. 11/19/18  Yes Campbell Riches, MD  gabapentin (NEURONTIN) 400 MG capsule TAKE 1 CAPSULE(400 MG) BY MOUTH FOUR TIMES DAILY 10/10/18  Yes Michel Bickers, MD  magnesium chloride (SLOW-MAG) 64 MG TBEC SR tablet Take 1 tablet (64 mg total) by mouth daily. 10/08/15  Yes  Jule Ser, DO  morphine (MS CONTIN) 30 MG 12 hr tablet Take 3 tablets (90 mg total) by mouth every 8 (eight) hours. 12/05/18  Yes Comer, Okey Regal, MD  Multiple Vitamin (MULTIVITAMIN WITH MINERALS) TABS tablet Take 1 tablet by mouth daily. 10/08/15  Yes Jule Ser, DO  oxyCODONE (OXY IR/ROXICODONE) 5 MG immediate release tablet Take 1 tablet (5 mg total) by mouth 2 (two) times daily as needed for severe pain. 12/05/18  Yes Comer, Okey Regal, MD  pantoprazole (PROTONIX) 40 MG tablet Take 1 tablet (40 mg total) by mouth daily. 06/20/18  Yes Valinda Party, DO  phosphorus (K PHOS NEUTRAL) 155-852-130 MG tablet Take 3 tablets (750 mg total) by mouth 2 (two) times daily. 10/08/15  Yes Jule Ser, DO  potassium chloride SA (K-DUR,KLOR-CON) 20 MEQ tablet Take 1 tablet (20 mEq total) by mouth daily. 05/10/17  Yes Hoffman, Jessica Ratliff, DO  potassium citrate (UROCIT-K) 10 MEQ (1080 MG) SR tablet Take 4 tablets (40 mEq total) by mouth 2 (two) times daily with a meal. 09/22/15  Yes Shela Leff, MD  SANTYL ointment APPLY TOPICALLY TO THE AFFECTED AREA(S) DAILY 11/05/15  Yes Iline Oven, MD  sodium bicarbonate 650 MG tablet Take 2 tablets (1,300 mg total) by mouth 3 (three) times daily. 10/08/15  Yes Jule Ser, DO  Tenofovir Alafenamide Fumarate (VEMLIDY) 25 MG TABS Take 1 tablet (25 mg total)  by mouth daily. 06/11/18  Yes Michel Bickers, MD  warfarin (COUMADIN) 5 MG tablet TAKE 1 AND 1/2 TABLETS BY MOUTH ON SUNDAYS, TUESDAYS, THURSDAYS, AND SATURDAYS, ALL OTHER DAYS TAKE ONLY 1 TABLET 05/27/18  Yes [name], RPH-CPP  warfarin (COUMADIN) 5 MG tablet TAKE 1 AND 1/2 TABLETS BY MOUTH ON SUNDAY, TUESDAYS,THURSDAYS, AND SATURDAYS, ALL OTHER DAYS TAKE ONLY 1 TABLET 11/19/18  Yes Hoffman, Jessica Ratliff, DO  acetaminophen (TYLENOL) 500 MG tablet Take 1 tablet (500 mg total) by mouth every 4 (four) hours as needed for mild pain, moderate pain or headache. Patient not taking: Reported  on 12/09/2018 09/22/15   Shela Leff, MD   Past Medical History:  Diagnosis Date  . Chronic kidney disease   . History of chicken pox   . History of DVT of lower extremity    right  . HIV (human immunodeficiency virus infection) ([place])   . Hyperlipidemia   . Left hip pain 03/09/2013  . Pneumonia   . Protein malnutrition ([place]) 08/2015   Social History   Socioeconomic History  . Marital status: Widowed    Spouse name: Not on file  . Number of children: 3  . Years of education: Not on file  . Highest education level: Not on file  Occupational History    Employer: UNEMPLOYED  Social Needs  . Financial resource strain: Not on file  . Food insecurity:    Worry: Not on file    Inability: Not on file  . Transportation needs:    Medical: Not on file    Non-medical: Not on file  Tobacco Use  . Smoking status: Current Every Day Smoker    Packs/day: 0.50    Years: 36.00    Pack years: 18.00    Types: Cigarettes  . Smokeless tobacco: Never Used  . Tobacco comment: ABOUT 1/2PPD.  Slowing down  Substance and Sexual Activity  . Alcohol use: No    Alcohol/week: 0.0 standard drinks  . Drug use: No  . Sexual activity: Not Currently    Comment: declined condoms  Lifestyle  . Physical activity:    Days per week: Not on file    Minutes per session: Not on file  . Stress: Not on file  Relationships  . Social connections:    Talks on phone: Not on file    Gets together: Not on file    Attends religious service: Not on file    Active member of club or organization: Not on file    Attends meetings of clubs or organizations: Not on file    Relationship status: Not on file  Other Topics Concern  . Not on file  Social History Narrative  . Not on file   Family History  Problem Relation Age of Onset  . Arthritis Mother   . Kidney disease Maternal Grandfather     ASSESSMENT Recent Results: The most recent result is correlated with 45 mg per week: Lab Results  Component  Value Date   INR 3.4 (A) 12/09/2018   INR 2.7 10/28/2018   INR 2.5 09/16/2018   PROTIME 38.4 (H) 04/01/2012    Anticoagulation Dosing: Description   Take 1 & 1/2 tablets of your peach-colored 5mg  strength warfarin tablets Sundays, Tuesdays, Thursdays; on Mondays, Wednesdays and Fridays,  and Saturdays, ONLY 1 tablet.      INR today: Supratherapeutic  PLAN Weekly dose was decreased by 6% to 42.5 mg per week  Patient Instructions  Patient instructed to take medications as  defined in the Anti-coagulation Track section of this encounter.  Patient instructed to take today's dose.  Patient instructed to take  1 & 1/2 tablets of your peach-colored 5mg  strength warfarin tablets Sundays, Tuesdays, Thursdays; on Mondays, Wednesdays and Fridays,  and Saturdays, ONLY 1 tablet. Patient verbalized understanding of these instructions.     Patient advised to contact clinic or seek medical attention if signs/symptoms of bleeding or thromboembolism occur.  Patient verbalized understanding by repeating back information and was advised to contact me if further medication-related questions arise. Patient was also provided an information handout.  Follow-up Return in 6 weeks (on 01/20/2019) for Follow up INR at 1030h.  [name], PharmD, CPP  15 minutes spent face-to-face with the patient during the encounter. 50% of time spent on education. 50% of time was spent on fingerstick point of care INR sample collection, processing, results determination, dose adjustment and documentation in [email or web address]

## 2018-12-09 NOTE — Progress Notes (Signed)
Patient Active Problem List   Diagnosis Date Noted  . Phantom limb syndrome with pain ([place]) 03/21/2012    Priority: High  . Long term current use of anticoagulant 11/20/2010    Priority: High  . History of DVT (deep vein thrombosis) 07/19/2009    Priority: High  . Human immunodeficiency virus (HIV) disease ([place]) 11/10/2006    Priority: High  . DYSLIPIDEMIA 11/10/2006    Priority: High  . CIGARETTE SMOKER 11/10/2006    Priority: High  . Prosthesis adjustment 07/20/2017  . Long term current use of opiate analgesic 12/17/2015  . Pressure ulcer 09/13/2015  . Protein-calorie malnutrition, severe 09/13/2015  . Avascular necrosis of femoral head ([place]) 05/14/2015  . Left adrenal mass ([place]) 05/14/2015  . Preventative health care 03/09/2013  . ERECTILE DYSFUNCTION, ORGANIC 04/12/2010  . GERD 08/23/2009  . HYPOGONADISM 07/15/2009  . DEFICIENCY OF OTHER VITAMINS 07/15/2009  . Pulmonary nodule 07/15/2009  . Status post above knee amputation ([place]) 07/15/2009  . SHINGLES, RECURRENT 11/10/2006  . Alcohol abuse 11/10/2006  . PERIPHERAL NEUROPATHY 11/10/2006  . ALLERGIC RHINITIS 11/10/2006    Patient's Medications  New Prescriptions   No medications on file  Previous Medications   ACETAMINOPHEN (TYLENOL) 500 MG TABLET    Take 1 tablet (500 mg total) by mouth every 4 (four) hours as needed for mild pain, moderate pain or headache.   DARUNAVIR-COBICISTAT ([name]) 800-150 MG TABLET    TAKE 1 TABLET BY MOUTH DAILY. SWALLOW WHOLE. DO NOT CRUSH, BREAK OR CHEW TABLETS. TAKE WITH FOOD.   DOLUTEGRAVIR ([name]) 50 MG TABLET    TAKE 1 TABLET (50 MG TOTAL) BY MOUTH DAILY.   ENSURE (ENSURE)    Take 1 Can by mouth 4 (four) times daily - after meals and at bedtime.   GABAPENTIN (NEURONTIN) 400 MG CAPSULE    TAKE 1 CAPSULE(400 MG) BY MOUTH FOUR TIMES DAILY   MAGNESIUM CHLORIDE (SLOW-MAG) 64 MG TBEC SR TABLET    Take 1 tablet (64 mg total) by mouth daily.   MORPHINE (MS CONTIN) 30 MG 12  HR TABLET    Take 3 tablets (90 mg total) by mouth every 8 (eight) hours.   MULTIPLE VITAMIN (MULTIVITAMIN WITH MINERALS) TABS TABLET    Take 1 tablet by mouth daily.   OXYCODONE (OXY IR/ROXICODONE) 5 MG IMMEDIATE RELEASE TABLET    Take 1 tablet (5 mg total) by mouth 2 (two) times daily as needed for severe pain.   PANTOPRAZOLE (PROTONIX) 40 MG TABLET    Take 1 tablet (40 mg total) by mouth daily.   PHOSPHORUS (K PHOS NEUTRAL) 155-852-130 MG TABLET    Take 3 tablets (750 mg total) by mouth 2 (two) times daily.   POTASSIUM CHLORIDE SA (K-DUR,KLOR-CON) 20 MEQ TABLET    Take 1 tablet (20 mEq total) by mouth daily.   POTASSIUM CITRATE (UROCIT-K) 10 MEQ (1080 MG) SR TABLET    Take 4 tablets (40 mEq total) by mouth 2 (two) times daily with a meal.   SANTYL OINTMENT    APPLY TOPICALLY TO THE AFFECTED AREA(S) DAILY   SODIUM BICARBONATE 650 MG TABLET    Take 2 tablets (1,300 mg total) by mouth 3 (three) times daily.   TENOFOVIR ALAFENAMIDE FUMARATE (VEMLIDY) 25 MG TABS    Take 1 tablet (25 mg total) by mouth daily.   WARFARIN (COUMADIN) 5 MG TABLET    TAKE 1 AND 1/2 TABLETS BY MOUTH ON SUNDAYS, TUESDAYS, THURSDAYS, AND SATURDAYS,  ALL OTHER DAYS TAKE ONLY 1 TABLET   WARFARIN (COUMADIN) 5 MG TABLET    TAKE 1 AND 1/2 TABLETS BY MOUTH ON SUNDAY, TUESDAYS,THURSDAYS, AND SATURDAYS, ALL OTHER DAYS TAKE ONLY 1 TABLET  Modified Medications   No medications on file  Discontinued Medications   No medications on file    Subjective: [name] is in for his routine HIV follow-up visit.  He has had no problems obtaining, taking or tolerating his [name] and [name] except for 1 day when his pharmacy was late with a delivery.  Other than that he has not missed any doses.  He continues to smoke 1 pack of cigarettes daily.  He has no current plan to cut down or quit but notes that a friend has been able to cut down from about a pack a day to only 3 cigarettes daily.  He says that over the past week he has made a commitment to  trying to be more active.  He is wearing his right leg prosthesis more and getting up out of his wheelchair more.  He says he wants to work on his strength and get back to where he can walk unassisted.  He says that he has gained some weight recently and is a little concerned that he may outgrow his right leg prosthesis.  Currently, he says that it fits well.  His chronic pain is under good control.  He has had his influenza vaccine this season.  Review of Systems: Review of Systems  Constitutional: Negative for chills, diaphoresis, fever, malaise/fatigue and weight loss.  HENT: Negative for sore throat.   Respiratory: Negative for cough, sputum production and shortness of breath.   Cardiovascular: Negative for chest pain.  Gastrointestinal: Positive for constipation. Negative for abdominal pain, diarrhea, heartburn, nausea and vomiting.  Genitourinary: Negative for dysuria and frequency.  Musculoskeletal: Positive for joint pain. Negative for myalgias.  Skin: Negative for rash.  Neurological: Negative for dizziness and headaches.  Psychiatric/Behavioral: Negative for depression and substance abuse. The patient is not nervous/anxious.     Past Medical History:  Diagnosis Date  . Chronic kidney disease   . History of chicken pox   . History of DVT of lower extremity    right  . HIV (human immunodeficiency virus infection) ([place])   . Hyperlipidemia   . Left hip pain 03/09/2013  . Pneumonia   . Protein malnutrition ([place]) 08/2015    Social History   Tobacco Use  . Smoking status: Current Every Day Smoker    Packs/day: 0.50    Years: 36.00    Pack years: 18.00    Types: Cigarettes  . Smokeless tobacco: Never Used  . Tobacco comment: ABOUT 1/2PPD.  Slowing down  Substance Use Topics  . Alcohol use: No    Alcohol/week: 0.0 standard drinks  . Drug use: No    Family History  Problem Relation Age of Onset  . Arthritis Mother   . Kidney disease Maternal Grandfather     Allergies   Allergen Reactions  . Dapsone     REACTION: fever  . Megestrol   . Sulfamethoxazole-Trimethoprim     REACTION: fever    Health Maintenance  Topic Date Due  . COLONOSCOPY  03/18/2011  . COLON CANCER SCREENING ANNUAL FOBT  12/20/2018  . TETANUS/TDAP  10/08/2024  . INFLUENZA VACCINE  Completed  . Hepatitis C Screening  Completed  . HIV Screening  Completed    Objective:  Vitals:   12/09/18 0925  BP: 128/73  Pulse: (!) 109  Temp: 98.4 F (36.9 C)   There is no height or weight on file to calculate BMI.  Physical Exam Constitutional:      Comments: He is in good spirits and more talkative than normal.  He is seated in his electric wheelchair.  He is wearing his right leg prosthesis.  HENT:     Mouth/Throat:     Pharynx: No oropharyngeal exudate.  Eyes:     Conjunctiva/sclera: Conjunctivae normal.  Cardiovascular:     Rate and Rhythm: Normal rate and regular rhythm.     Heart sounds: No murmur.  Pulmonary:     Effort: Pulmonary effort is normal.     Breath sounds: Normal breath sounds.  Abdominal:     Palpations: Abdomen is soft. There is no mass.     Tenderness: There is no abdominal tenderness.  Musculoskeletal: Normal range of motion.  Skin:    Findings: No rash.  Neurological:     Mental Status: He is alert and oriented to person, place, and time.  Psychiatric:        Mood and Affect: Mood normal.     Lab Results Lab Results  Component Value Date   WBC CANCELED 11/25/2018   HGB 17.5 (H) 11/22/2017   HCT 51.6 (H) 11/22/2017   MCV 88.1 11/22/2017   PLT 279 11/22/2017    Lab Results  Component Value Date   CREATININE 1.24 11/25/2018   BUN 11 11/25/2018   NA 138 11/25/2018   K 3.7 11/25/2018   CL 98 11/25/2018   CO2 30 11/25/2018    Lab Results  Component Value Date   ALT 16 11/25/2018   AST 18 11/25/2018   ALKPHOS 408 (H) 05/22/2017   BILITOT 0.7 11/25/2018    Lab Results  Component Value Date   CHOL 211 (H) 05/16/2016   HDL 40  05/16/2016   LDLCALC 133 (H) 05/16/2016   TRIG 189 (H) 05/16/2016   CHOLHDL 5.3 (H) 05/16/2016   Lab Results  Component Value Date   LABRPR NON-REACTIVE 11/25/2018   HIV 1 RNA Quant (copies/mL)  Date Value  11/25/2018 <20 NOT DETECTED  05/23/2018 <20 NOT DETECTED  11/22/2017 50 (H)   CD4 T Cell Abs (/uL)  Date Value  11/25/2018 610  05/23/2018 920  11/22/2017 800     Problem List Items Addressed This Visit      High   Phantom limb syndrome with pain ([place])    His chronic pain syndrome is well controlled and he has been following his pain contract without concern.      Human immunodeficiency virus (HIV) disease ([place])    His infection is under very good, long-term control.  He will continue [name] and [name] and follow-up after lab work in 6 months.      Relevant Orders   T-helper cell (CD4)- ([facility] clinic only)   HIV-1 RNA quant-no reflex-bld   CIGARETTE SMOKER    I talked him at length today again about the potential threats related to his ongoing cigarette smoking.  Encouraged him to consider cutting down and trying to quit.  He says that if he was able to quit using drugs and quit drinking alcohol he thinks he will be able to cut down.        Unprioritized   Alcohol abuse    He has been sober for many years.           Michel Bickers, MD [facility] [phone] pager   [phone] cell 12/09/2018, 9:49 AM

## 2018-12-09 NOTE — Assessment & Plan Note (Signed)
I talked him at length today again about the potential threats related to his ongoing cigarette smoking.  Encouraged him to consider cutting down and trying to quit.  He says that if he was able to quit using drugs and quit drinking alcohol he thinks he will be able to cut down.

## 2018-12-09 NOTE — Patient Instructions (Signed)
Patient instructed to take medications as defined in the Anti-coagulation Track section of this encounter.  Patient instructed to take today's dose.  Patient instructed to take  1 & 1/2 tablets of your peach-colored 5mg  strength warfarin tablets Sundays, Tuesdays, Thursdays; on Mondays, Wednesdays and Fridays,  and Saturdays, ONLY 1 tablet. Patient verbalized understanding of these instructions.

## 2018-12-09 NOTE — Assessment & Plan Note (Signed)
He has been sober for many years.

## 2018-12-09 NOTE — Assessment & Plan Note (Signed)
His infection is under very good, long-term control.  He will continue [name] and [name] and follow-up after lab work in 6 months.

## 2018-12-09 NOTE — Assessment & Plan Note (Signed)
His chronic pain syndrome is well controlled and he has been following his pain contract without concern.

## 2018-12-11 NOTE — Progress Notes (Signed)
INTERNAL MEDICINE TEACHING ATTENDING ADDENDUM - Birch Farino M.D  °Duration- indefinite, Indication- DVT, INR- supratherapeutic. Agree with pharmacy recommendations as outlined in their note.  ° ° ° °

## 2018-12-16 ENCOUNTER — Other Ambulatory Visit: Payer: Self-pay

## 2018-12-16 ENCOUNTER — Other Ambulatory Visit: Payer: Self-pay | Admitting: Internal Medicine

## 2018-12-16 DIAGNOSIS — B20 Human immunodeficiency virus [HIV] disease: Secondary | ICD-10-CM

## 2018-12-16 MED ORDER — TENOFOVIR ALAFENAMIDE FUMARATE 25 MG PO TABS: 1.0000 | ORAL_TABLET | Freq: Every day | ORAL | 5 refills | Status: DC

## 2018-12-16 MED ORDER — DOLUTEGRAVIR SODIUM 50 MG PO TABS: ORAL_TABLET | ORAL | 5 refills | Status: DC

## 2018-12-16 MED ORDER — DARUNAVIR-COBICISTAT 800-150 MG PO TABS: ORAL_TABLET | ORAL | 5 refills | Status: DC

## 2018-12-17 ENCOUNTER — Other Ambulatory Visit: Payer: Self-pay | Admitting: *Deleted

## 2018-12-17 ENCOUNTER — Telehealth: Payer: Self-pay | Admitting: *Deleted

## 2018-12-17 DIAGNOSIS — B20 Human immunodeficiency virus [HIV] disease: Secondary | ICD-10-CM

## 2018-12-17 MED ORDER — TENOFOVIR ALAFENAMIDE FUMARATE 25 MG PO TABS: 1.0000 | ORAL_TABLET | Freq: Every day | ORAL | 5 refills | Status: DC

## 2018-12-17 NOTE — Telephone Encounter (Signed)
Initiated prior authorization via [email or web address] for patient's vemlidy 25 mg. [name], RN

## 2018-12-17 NOTE — Telephone Encounter (Signed)
Approved, notified pharmacy with voicemail.

## 2018-12-18 ENCOUNTER — Other Ambulatory Visit: Payer: Self-pay | Admitting: Internal Medicine

## 2018-12-18 DIAGNOSIS — B20 Human immunodeficiency virus [HIV] disease: Secondary | ICD-10-CM

## 2018-12-25 ENCOUNTER — Encounter: Payer: Self-pay | Admitting: Internal Medicine

## 2018-12-31 ENCOUNTER — Other Ambulatory Visit: Payer: Self-pay | Admitting: Behavioral Health

## 2018-12-31 ENCOUNTER — Other Ambulatory Visit: Payer: Self-pay | Admitting: Internal Medicine

## 2018-12-31 DIAGNOSIS — G546 Phantom limb syndrome with pain: Secondary | ICD-10-CM

## 2018-12-31 DIAGNOSIS — G894 Chronic pain syndrome: Secondary | ICD-10-CM

## 2018-12-31 MED ORDER — OXYCODONE HCL 5 MG PO TABS: 5.0000 mg | ORAL_TABLET | Freq: Two times a day (BID) | ORAL | 0 refills | Status: DC | PRN

## 2018-12-31 MED ORDER — MORPHINE SULFATE ER 30 MG PO TBCR: 90.0000 mg | EXTENDED_RELEASE_TABLET | Freq: Three times a day (TID) | ORAL | 0 refills | Status: DC

## 2018-12-31 NOTE — Telephone Encounter (Signed)
[name] called requesting a refill rx for his MS Contin 90mg  Q8 hours and Oxycodone 5mg  2 times daily prn.  Informed patient RN would check with Dr,  Megan Salon to see if hard copy prescription needs to be picked up or if it will be sent electronically to the pharmacy by Dr. Megan Salon.  [name] uses the [facility] or [place]. Pricilla Riffle RN

## 2018-12-31 NOTE — Telephone Encounter (Signed)
Called Mr. Blanchette and informed him that his pain medications were sent electronically to [facility].  Patient verified understanding and stated  He  Pricilla Riffle RN

## 2018-12-31 NOTE — Addendum Note (Signed)
Addended by: Marianne Callas on: 12/31/2018 12:40 PM   Modules accepted: Orders

## 2018-12-31 NOTE — Telephone Encounter (Signed)
Refills sent in for 30-d of MS Cotin and Oxycodone.   [place] website reviewed with no abnormalities.   Janene Madeira, MSN, NP-C [facility] [email or web address] Pager: [phone] Office: [phone]

## 2019-01-07 ENCOUNTER — Other Ambulatory Visit: Payer: Self-pay | Admitting: Internal Medicine

## 2019-01-07 DIAGNOSIS — G629 Polyneuropathy, unspecified: Secondary | ICD-10-CM

## 2019-01-15 ENCOUNTER — Other Ambulatory Visit: Payer: Self-pay | Admitting: Pharmacist

## 2019-01-15 DIAGNOSIS — Z86718 Personal history of other venous thrombosis and embolism: Secondary | ICD-10-CM

## 2019-01-15 DIAGNOSIS — Z7901 Long term (current) use of anticoagulants: Secondary | ICD-10-CM

## 2019-01-17 ENCOUNTER — Other Ambulatory Visit: Payer: Self-pay | Admitting: *Deleted

## 2019-01-17 DIAGNOSIS — Z7901 Long term (current) use of anticoagulants: Secondary | ICD-10-CM

## 2019-01-17 DIAGNOSIS — Z86718 Personal history of other venous thrombosis and embolism: Secondary | ICD-10-CM

## 2019-01-20 ENCOUNTER — Ambulatory Visit: Payer: Medicare Other

## 2019-01-21 ENCOUNTER — Telehealth: Payer: Self-pay | Admitting: Pharmacist

## 2019-01-21 DIAGNOSIS — Z7901 Long term (current) use of anticoagulants: Secondary | ICD-10-CM | POA: Diagnosis not present

## 2019-01-21 DIAGNOSIS — Z86718 Personal history of other venous thrombosis and embolism: Secondary | ICD-10-CM | POA: Diagnosis not present

## 2019-01-21 LAB — PROTIME-INR
INR: 1.7 — ABNORMAL HIGH (ref 0.8–1.2)
PROTHROMBIN TIME: 17.2 s — AB (ref 9.1–12.0)

## 2019-01-21 NOTE — Progress Notes (Signed)
Patient called and provided results of their INR collected at [facility] on 21-Jan-2019. Will increase dose from 42.5mg  warfarin per week to 45mg  warfarin per week as: Su-7.5mg M-5mg T-7.5mg W-5mg Th-7.5mg F-5mg Sa-7.5mg . Next INR on 03-Mar-2019. (Patient has requested six week intervals for INRs due to finances).

## 2019-01-21 NOTE — Telephone Encounter (Signed)
Patient called and provided results of their INR collected at [facility] on 21-Jan-2019. Will increase dose from 42.5mg  warfarin per week to 45mg  warfarin per week as: Su-7.5mg M-5mg T-7.5mg W-5mg Th-7.5mg F-5mg Sa-7.5mg . Next INR on 03-Mar-2019. (Patient has requested six week intervals for INRs due to finances).

## 2019-01-23 ENCOUNTER — Other Ambulatory Visit: Payer: Self-pay | Admitting: *Deleted

## 2019-01-23 DIAGNOSIS — Z86718 Personal history of other venous thrombosis and embolism: Secondary | ICD-10-CM

## 2019-01-23 DIAGNOSIS — Z7901 Long term (current) use of anticoagulants: Secondary | ICD-10-CM

## 2019-01-29 ENCOUNTER — Other Ambulatory Visit: Payer: Self-pay

## 2019-01-29 NOTE — Telephone Encounter (Signed)
Patient called to request refills for pain medication. Routing to MD for electronic refill and signature.  [name], LPN

## 2019-01-30 ENCOUNTER — Other Ambulatory Visit: Payer: Self-pay | Admitting: Internal Medicine

## 2019-01-30 DIAGNOSIS — G894 Chronic pain syndrome: Secondary | ICD-10-CM

## 2019-01-30 DIAGNOSIS — G546 Phantom limb syndrome with pain: Secondary | ICD-10-CM

## 2019-01-30 MED ORDER — OXYCODONE HCL 5 MG PO TABS: 5.0000 mg | ORAL_TABLET | Freq: Two times a day (BID) | ORAL | 0 refills | Status: DC | PRN

## 2019-01-30 MED ORDER — MORPHINE SULFATE ER 30 MG PO TBCR: 90.0000 mg | EXTENDED_RELEASE_TABLET | Freq: Three times a day (TID) | ORAL | 0 refills | Status: DC

## 2019-01-30 NOTE — Telephone Encounter (Signed)
I sent refills electronically today.

## 2019-01-30 NOTE — Telephone Encounter (Signed)
Patient called and made aware of electronic refill request approved and sent to pharmacy.  [name], LPN

## 2019-01-30 NOTE — Telephone Encounter (Signed)
This appears to be Dr Hale Bogus patient? If he is out and needs me to do this, I can do it tomorrow when I am in clinic tomorrow Please let me know thanks

## 2019-01-31 ENCOUNTER — Telehealth: Payer: Self-pay | Admitting: Behavioral Health

## 2019-01-31 NOTE — Telephone Encounter (Signed)
Prior Authorization for Morphine Sulfate was approved by [place] review.  PA expires 10/30/2019.  [facility] made aware of the approval.  Called Mr. Haddix and made him aware that the PA was approved for Morphine Sulfate. Mr. Fosco verbalized understanding and was appreciative   Pricilla Riffle RN

## 2019-01-31 NOTE — Telephone Encounter (Signed)
Received a Prior Authorization request for Morphine Sulfate 90mg  PO Q8 hours for Mr Gianino.  Prior Authorization initiated and marked Urgent.  [employer] states Prior authorization is needed because the requested quantity exceeds recommended limit. Reference #[id number] expected response is within the next 24 hours. Called Mr. Goeken with this update.  He states he has enough medication to get him through Sunday morning. Pricilla Riffle RN

## 2019-02-22 ENCOUNTER — Other Ambulatory Visit: Payer: Self-pay | Admitting: Internal Medicine

## 2019-02-26 ENCOUNTER — Other Ambulatory Visit: Payer: Self-pay | Admitting: Internal Medicine

## 2019-02-26 ENCOUNTER — Telehealth: Payer: Self-pay

## 2019-02-26 DIAGNOSIS — G546 Phantom limb syndrome with pain: Secondary | ICD-10-CM

## 2019-02-26 DIAGNOSIS — G894 Chronic pain syndrome: Secondary | ICD-10-CM

## 2019-02-26 MED ORDER — MORPHINE SULFATE ER 30 MG PO TBCR: 90.0000 mg | EXTENDED_RELEASE_TABLET | Freq: Three times a day (TID) | ORAL | 0 refills | Status: AC

## 2019-02-26 MED ORDER — OXYCODONE HCL 5 MG PO TABS: 5.0000 mg | ORAL_TABLET | Freq: Two times a day (BID) | ORAL | 0 refills | Status: DC | PRN

## 2019-02-26 NOTE — Telephone Encounter (Signed)
I sent in refills.  Thanks.

## 2019-02-26 NOTE — Telephone Encounter (Signed)
Received voicemail from patient requesting refills for pain medications. Routing to MD for advise.  [name], LPN

## 2019-03-11 ENCOUNTER — Encounter (HOSPITAL_COMMUNITY): Payer: Self-pay

## 2019-03-11 ENCOUNTER — Ambulatory Visit (HOSPITAL_COMMUNITY)
Admission: EM | Admit: 2019-03-11 | Discharge: 2019-03-11 | Disposition: A | Discharge status: Home / Self Care | Payer: Medicare Other | Attending: Family Medicine | Admitting: Family Medicine

## 2019-03-11 ENCOUNTER — Other Ambulatory Visit: Payer: Self-pay

## 2019-03-11 ENCOUNTER — Other Ambulatory Visit: Payer: Self-pay | Admitting: Internal Medicine

## 2019-03-11 DIAGNOSIS — H11422 Conjunctival edema, left eye: Secondary | ICD-10-CM | POA: Diagnosis not present

## 2019-03-11 DIAGNOSIS — Z86718 Personal history of other venous thrombosis and embolism: Secondary | ICD-10-CM | POA: Diagnosis not present

## 2019-03-11 DIAGNOSIS — Z7901 Long term (current) use of anticoagulants: Secondary | ICD-10-CM | POA: Diagnosis not present

## 2019-03-11 LAB — PROTIME-INR
INR: 2.3 — ABNORMAL HIGH (ref 0.8–1.2)
Prothrombin Time: 23.6 s — ABNORMAL HIGH (ref 9.1–12.0)

## 2019-03-11 MED ORDER — ERYTHROMYCIN 5 MG/GM OP OINT: TOPICAL_OINTMENT | OPHTHALMIC | 0 refills | Status: DC

## 2019-03-11 MED ORDER — OLOPATADINE HCL 0.1 % OP SOLN: 1.0000 [drp] | Freq: Two times a day (BID) | OPHTHALMIC | 12 refills | Status: DC

## 2019-03-11 NOTE — ED Triage Notes (Signed)
Pt states he has a pink eye ( left eye ). Pt has had this 4 days. Pt states his eye has been matted shut. Pt states his right eye sore too.

## 2019-03-11 NOTE — ED Provider Notes (Signed)
[place]    CSN: [id number] Arrival date & time: 03/11/19  0846     History   Chief Complaint Chief Complaint  Patient presents with  . Conjunctivitis    HPI [name] is a 58 y.o. male history of CKD, HIV, hyperlipidemia, tobacco use, alcohol use presenting today for evaluation of left eye irritation and redness.  Patient states that over the past month he has had redness to his left eye.  Of recently over the past 1 to 2 weeks he has developed a foreign body sensation in his eyes.  Symptoms are mainly in the left, mild on the right.  Denies changes in vision or photophobia.  Notes that he previously wore glasses, but has not worn them in a while, denies contact use.  Occasionally with watery drainage.  Denies any injury or trauma to the eye.  HPI  Past Medical History:  Diagnosis Date  . Chronic kidney disease   . History of chicken pox   . History of DVT of lower extremity    right  . HIV (human immunodeficiency virus infection) ([place])   . Hyperlipidemia   . Left hip pain 03/09/2013  . Pneumonia   . Protein malnutrition ([place]) 08/2015    Patient Active Problem List   Diagnosis Date Noted  . Prosthesis adjustment 07/20/2017  . Long term current use of opiate analgesic 12/17/2015  . Pressure ulcer 09/13/2015  . Protein-calorie malnutrition, severe 09/13/2015  . Avascular necrosis of femoral head ([place]) 05/14/2015  . Left adrenal mass ([place]) 05/14/2015  . Preventative health care 03/09/2013  . Phantom limb syndrome with pain ([place]) 03/21/2012  . Long term current use of anticoagulant 11/20/2010  . ERECTILE DYSFUNCTION, ORGANIC 04/12/2010  . GERD 08/23/2009  . History of DVT (deep vein thrombosis) 07/19/2009  . HYPOGONADISM 07/15/2009  . DEFICIENCY OF OTHER VITAMINS 07/15/2009  . Pulmonary nodule 07/15/2009  . Status post above knee amputation ([place]) 07/15/2009  . Human immunodeficiency virus (HIV) disease ([place]) 11/10/2006  . SHINGLES, RECURRENT  11/10/2006  . DYSLIPIDEMIA 11/10/2006  . Alcohol abuse 11/10/2006  . CIGARETTE SMOKER 11/10/2006  . PERIPHERAL NEUROPATHY 11/10/2006  . ALLERGIC RHINITIS 11/10/2006    Past Surgical History:  Procedure Laterality Date  . APPENDECTOMY  1962  . LEG AMPUTATION ABOVE KNEE  2010   right leg       Home Medications    Prior to Admission medications   Medication Sig Start Date End Date Taking? Authorizing Provider  acetaminophen (TYLENOL) 500 MG tablet Take 1 tablet (500 mg total) by mouth every 4 (four) hours as needed for mild pain, moderate pain or headache. Patient not taking: Reported on 12/09/2018 09/22/15   Shela Leff, MD  darunavir-cobicistat ([name]) 800-150 MG tablet TAKE 1 TABLET BY MOUTH DAILY. SWALLOW WHOLE. DO NOT CRUSH, BREAK OR CHEW TABLETS. TAKE WITH FOOD. 12/16/18   Michel Bickers, MD  dolutegravir ([name]) 50 MG tablet TAKE 1 TABLET (50 MG TOTAL) BY MOUTH DAILY. 12/16/18   Michel Bickers, MD  ENSURE (ENSURE) Take 1 Can by mouth 4 (four) times daily - after meals and at bedtime. 11/19/18   Campbell Riches, MD  erythromycin ophthalmic ointment Place a 1/2 inch ribbon of ointment into the lower eyelid. 03/11/19   Wieters, Hallie C, PA-C  gabapentin (NEURONTIN) 400 MG capsule TAKE 1 CAPSULE(400 MG) BY MOUTH FOUR TIMES DAILY 01/07/19   Michel Bickers, MD  magnesium chloride (SLOW-MAG) 64 MG TBEC SR tablet Take 1 tablet (64 mg total) by  mouth daily. 10/08/15   Jule Ser, DO  morphine (MS CONTIN) 30 MG 12 hr tablet Take 3 tablets (90 mg total) by mouth every 8 (eight) hours for 30 days. 02/26/19 03/28/19  Michel Bickers, MD  Multiple Vitamin (MULTIVITAMIN WITH MINERALS) TABS tablet Take 1 tablet by mouth daily. 10/08/15   Jule Ser, DO  olopatadine (PATANOL) 0.1 % ophthalmic solution Place 1 drop into both eyes 2 (two) times daily. 03/11/19   Wieters, Hallie C, PA-C  oxyCODONE (OXY IR/ROXICODONE) 5 MG immediate release tablet Take 1 tablet (5 mg total) by mouth 2  (two) times daily as needed for up to 30 days for severe pain. 02/26/19 03/28/19  Michel Bickers, MD  pantoprazole (PROTONIX) 40 MG tablet Take 1 tablet (40 mg total) by mouth daily. 06/20/18   Valinda Party, DO  phosphorus (K PHOS NEUTRAL) 155-852-130 MG tablet Take 3 tablets (750 mg total) by mouth 2 (two) times daily. 10/08/15   Jule Ser, DO  potassium chloride SA (K-DUR,KLOR-CON) 20 MEQ tablet Take 1 tablet (20 mEq total) by mouth daily. 05/10/17   Kalman Shan Ratliff, DO  potassium citrate (UROCIT-K) 10 MEQ (1080 MG) SR tablet Take 4 tablets (40 mEq total) by mouth 2 (two) times daily with a meal. 09/22/15   Shela Leff, MD  SANTYL ointment APPLY TOPICALLY TO THE AFFECTED AREA(S) DAILY 11/05/15   Iline Oven, MD  sodium bicarbonate 650 MG tablet Take 2 tablets (1,300 mg total) by mouth 3 (three) times daily. 10/08/15   Jule Ser, DO  Tenofovir Alafenamide Fumarate (VEMLIDY) 25 MG TABS Take 1 tablet (25 mg total) by mouth daily. 12/17/18   Comer, Okey Regal, MD  warfarin (COUMADIN) 5 MG tablet TAKE 1 AND 1/2 TABLETS BY MOUTH ON SUNDAY, TUESDAY, THURSDAY AND SATURDAY AND 1 TABLET ON ALL OTHER DAYS 02/24/19   Aldine Contes, MD    Family History Family History  Problem Relation Age of Onset  . Arthritis Mother   . Kidney disease Maternal Grandfather     Social History Social History   Tobacco Use  . Smoking status: Current Every Day Smoker    Packs/day: 0.50    Years: 36.00    Pack years: 18.00    Types: Cigarettes  . Smokeless tobacco: Never Used  . Tobacco comment: ABOUT 1/2PPD.  Slowing down  Substance Use Topics  . Alcohol use: No    Alcohol/week: 0.0 standard drinks  . Drug use: No     Allergies   Dapsone; Megestrol; and Sulfamethoxazole-trimethoprim   Review of Systems Review of Systems  Constitutional: Negative for activity change, appetite change, chills, fatigue and fever.  HENT: Negative for congestion, ear pain, rhinorrhea,  sinus pressure, sore throat and trouble swallowing.   Eyes: Positive for pain, discharge and redness. Negative for photophobia, itching and visual disturbance.  Respiratory: Negative for cough, chest tightness and shortness of breath.   Cardiovascular: Negative for chest pain.  Gastrointestinal: Negative for abdominal pain, diarrhea, nausea and vomiting.  Musculoskeletal: Negative for myalgias.  Skin: Negative for rash.  Neurological: Negative for dizziness, light-headedness and headaches.     Physical Exam Triage Vital Signs ED Triage Vitals [03/11/19 0903]  Enc Vitals Group     BP 125/75     Pulse Rate 100     Resp 16     Temp 99.3 F (37.4 C)     Temp Source Oral     SpO2 100 %     Weight  Height      Head Circumference      Peak Flow      Pain Score      Pain Loc      Pain Edu?      Excl. in [place]?    No data found.  Updated Vital Signs BP 125/75 (BP Location: Right Arm)   Pulse 100   Temp 99.3 F (37.4 C) (Oral)   Resp 16   Wt 130 lb (59 kg)   SpO2 100%   BMI 18.13 kg/m   Visual Acuity Right Eye Distance: 20/70 Left Eye Distance: 20/30 Bilateral Distance: 20/30  Right Eye Near:   Left Eye Near:    Bilateral Near:     Physical Exam Vitals signs and nursing note reviewed.  Constitutional:      Appearance: He is well-developed.  HENT:     Head: Normocephalic and atraumatic.  Eyes:     Conjunctiva/sclera: Conjunctivae normal.     Pupils: Pupils are equal, round, and reactive to light.      Comments: Left eye with lateral deviation, left conjunctive a erythematous, chemosis predominantly present in right lower quadrant-pressure averaging 19; small area of fluorescein uptake to right border of cornea  Left eye without erythema or chemosis, corneal arcus present, pressure averaging 22  No significant photophobia with exam bilaterally  Neck:     Musculoskeletal: Neck supple.  Cardiovascular:     Rate and Rhythm: Normal rate and regular rhythm.      Heart sounds: No murmur.  Pulmonary:     Effort: Pulmonary effort is normal. No respiratory distress.     Breath sounds: Normal breath sounds.  Abdominal:     Palpations: Abdomen is soft.     Tenderness: There is no abdominal tenderness.  Skin:    General: Skin is warm and dry.  Neurological:     Mental Status: He is alert.      UC Treatments / Results  Labs (all labs ordered are listed, but only abnormal results are displayed) Labs Reviewed - No data to display  EKG None  Radiology No results found.  Procedures Procedures (including critical care time)  Medications Ordered in UC Medications - No data to display  Initial Impression / Assessment and Plan / UC Course  I have reviewed the triage vital signs and the nursing notes.  Pertinent labs & imaging results that were available during my care of the patient were reviewed by me and considered in my medical decision making (see chart for details).     Visual acuity in right decreased compared to left, do not suspect underlying ophthalmology emergency given length of symptoms, ecchymosis suggestive of possible underlying allergic conjunctivitis, possible small corneal abrasion.  Will cover for both and will provide olopatadine twice daily and erythromycin ointment over the next week.  Follow-up with ophthalmology if symptoms persisting without improvement with the above.  Follow-up here in the emergency room if developing changes in vision, eye pain, worsening symptoms.  Discussed also following up with ophthalmology/optometry in order to obtain new glasses.Discussed strict return precautions. Patient verbalized understanding and is agreeable with plan.  Final Clinical Impressions(s) / UC Diagnoses   Final diagnoses:  Chemosis of conjunctiva, left     Discharge Instructions     Please use olopatadine eye drops twice daily in both eyes to help with discomfort/watering Erythromycin ointment 4-6 times daily in lower lid  x 1 week  Follow up with eye doctor- contact below- if symptoms persisting  in 1 week Follow up here or in emergency room if symptoms worsening or developing pain, changes in vision    ED Prescriptions    Medication Sig Dispense Auth. Provider   olopatadine (PATANOL) 0.1 % ophthalmic solution Place 1 drop into both eyes 2 (two) times daily. 5 mL Wieters, Hallie C, PA-C   erythromycin ophthalmic ointment Place a 1/2 inch ribbon of ointment into the lower eyelid. 3.5 g Wieters, [place] C, PA-C     Controlled Substance Prescriptions [place] Controlled Substance Registry consulted? Not Applicable   Janith Lima, [place] 03/11/19 8280

## 2019-03-11 NOTE — Telephone Encounter (Signed)
Could you use the Urgent care note for PA.  If not this medication is over the counter.

## 2019-03-11 NOTE — Telephone Encounter (Signed)
Pls contact [phone], trying to get some eye drops from the eye dr

## 2019-03-11 NOTE — Telephone Encounter (Signed)
Unfortunately, the Urgent Care note is not helpful for obtaining a PA-as request will also want to know which "eye drops" pt has tried and failed.  Insurance not likely to pay for this medication if it's available over the counter.  Call made to pharmacy, it is available otc and [facility] also has discount coupon pt can use.  They will contact pt directly to inform him about coupon and medication.  No further action needed at this time.  Phone call complete.Despina Hidden Cassady5/12/20202:37 PM

## 2019-03-11 NOTE — Telephone Encounter (Signed)
rtc to pt, he states he cant get all his medicine prescribed at urg care because his dr needs to approve it. Called pharm, needs PA on OLOPATADINE 0.1% SOLUTION- PLACE 1 DROP INTO BOTH EYES 2 TIMES DAILY Pt tried calling urg care and cannot get them he states he is miserable, could his pcp and clinic help him. Sending to [place] and dr Heber 

## 2019-03-11 NOTE — Discharge Instructions (Addendum)
Please use olopatadine eye drops twice daily in both eyes to help with discomfort/watering Erythromycin ointment 4-6 times daily in lower lid x 1 week  Follow up with eye doctor- contact below- if symptoms persisting in 1 week Follow up here or in emergency room if symptoms worsening or developing pain, changes in vision

## 2019-03-12 ENCOUNTER — Other Ambulatory Visit: Payer: Self-pay | Admitting: *Deleted

## 2019-03-12 DIAGNOSIS — Z86718 Personal history of other venous thrombosis and embolism: Secondary | ICD-10-CM

## 2019-03-12 DIAGNOSIS — Z7901 Long term (current) use of anticoagulants: Secondary | ICD-10-CM

## 2019-03-13 ENCOUNTER — Telehealth: Payer: Self-pay | Admitting: Pharmacist

## 2019-03-13 NOTE — Telephone Encounter (Signed)
done

## 2019-03-13 NOTE — Telephone Encounter (Signed)
INR reported from [facility] = 2.3 (goal 2.0 - 3.0 for history of VTE with recurrence) on 5mg  MWF; 7.5mg  all other days. Will CONTINUE this regimen. Patient requests 6 week intervals (permitted by CHEST Guidelines for stable responding patients). Next INR 28-Apr-2019 at 10:00AM. Requests NEXT VISIT to be IN the [facility]ticoagulation Management Clinic (discussed all necessary steps, NT Entrance, Masking, social distancing within clinic, etc.) No bleeding endorsed, no signs/symptoms of clotting endorsed. Plenty of warfarin on-hand.

## 2019-03-14 ENCOUNTER — Other Ambulatory Visit: Payer: Self-pay | Admitting: Internal Medicine

## 2019-03-14 ENCOUNTER — Telehealth: Payer: Self-pay | Admitting: *Deleted

## 2019-03-14 DIAGNOSIS — B20 Human immunodeficiency virus [HIV] disease: Secondary | ICD-10-CM

## 2019-03-14 MED ORDER — TENOFOVIR ALAFENAMIDE FUMARATE 25 MG PO TABS: 1.0000 | ORAL_TABLET | Freq: Every day | ORAL | 5 refills | Status: DC

## 2019-03-14 NOTE — Telephone Encounter (Signed)
Patient asking for refills of tivicay and prezcobix, patient also has vemlidy on his chart. Please advise on his complete regimen. [name], RN

## 2019-03-14 NOTE — Telephone Encounter (Signed)
He should continue his 3 drug regimen consisting of Vemlidy, [name] and [name].

## 2019-03-14 NOTE — Telephone Encounter (Signed)
Refilled. Thank you.

## 2019-03-14 NOTE — Telephone Encounter (Signed)
Reviewed Thanks DrG 

## 2019-03-31 ENCOUNTER — Telehealth: Payer: Self-pay | Admitting: *Deleted

## 2019-03-31 NOTE — Telephone Encounter (Signed)
Patient called to advise it is time for his refill of Oxy. Advised will let his provider know and give him a call back once it is sent into his pharmacy.

## 2019-04-01 ENCOUNTER — Other Ambulatory Visit: Payer: Self-pay | Admitting: Internal Medicine

## 2019-04-01 ENCOUNTER — Telehealth: Payer: Self-pay | Admitting: *Deleted

## 2019-04-01 DIAGNOSIS — G894 Chronic pain syndrome: Secondary | ICD-10-CM

## 2019-04-01 MED ORDER — OXYCODONE HCL 5 MG PO TABS: 5.0000 mg | ORAL_TABLET | Freq: Two times a day (BID) | ORAL | 0 refills | Status: DC | PRN

## 2019-04-01 NOTE — Telephone Encounter (Signed)
Sent today

## 2019-04-01 NOTE — Telephone Encounter (Signed)
Notified patient, left message on his voicemail.

## 2019-04-01 NOTE — Telephone Encounter (Signed)
Patient called, requesting refill of his oxycodone to be sent to [facility] at [facility] for pick up 6/3. Please advise. [name]

## 2019-04-02 ENCOUNTER — Other Ambulatory Visit: Payer: Self-pay | Admitting: Internal Medicine

## 2019-04-02 ENCOUNTER — Other Ambulatory Visit: Payer: Self-pay | Admitting: *Deleted

## 2019-04-02 DIAGNOSIS — G546 Phantom limb syndrome with pain: Secondary | ICD-10-CM

## 2019-04-02 MED ORDER — MORPHINE SULFATE ER 30 MG PO TBCR: 90.0000 mg | EXTENDED_RELEASE_TABLET | Freq: Three times a day (TID) | ORAL | 0 refills | Status: DC

## 2019-04-28 ENCOUNTER — Ambulatory Visit (INDEPENDENT_AMBULATORY_CARE_PROVIDER_SITE_OTHER): Payer: Medicare Other | Admitting: Pharmacist

## 2019-04-28 ENCOUNTER — Encounter: Payer: Self-pay | Admitting: *Deleted

## 2019-04-28 ENCOUNTER — Other Ambulatory Visit: Payer: Self-pay

## 2019-04-28 DIAGNOSIS — Z86718 Personal history of other venous thrombosis and embolism: Secondary | ICD-10-CM | POA: Diagnosis not present

## 2019-04-28 DIAGNOSIS — Z7901 Long term (current) use of anticoagulants: Secondary | ICD-10-CM | POA: Diagnosis not present

## 2019-04-28 DIAGNOSIS — Z5181 Encounter for therapeutic drug level monitoring: Secondary | ICD-10-CM | POA: Diagnosis not present

## 2019-04-28 LAB — POCT INR: INR: 2.4 (ref 2.0–3.0)

## 2019-04-28 NOTE — Progress Notes (Signed)
Anticoagulation Management [name] is a 58 y.o. male who reports to the clinic for monitoring of warfarin treatment.    Indication: DVT , History of; Long term current use of anticoagulant.   Duration: indefinite Supervising physician: Lenice Pressman, MD PhD  Anticoagulation Clinic Visit History: Patient does not report signs/symptoms of bleeding or thromboembolism  Other recent changes: No diet, medications, lifestyle changes.  Anticoagulation Episode Summary    Current INR goal:  2.0-3.0  TTR:  71.3 % (8.2 y)  Next INR check:  06/09/2019  INR from last check:  2.4 (04/28/2019)  Weekly max warfarin dose:    Target end date:  Indefinite  INR check location:  Anticoagulation Clinic  Preferred lab:    Send INR reminders to:  [facility]   Indications   History of DVT (deep vein thrombosis) [Z86.718] Long term current use of anticoagulant [Z79.01]       Comments:        Anticoagulation Care Providers    Provider Role Specialty Phone number   Michel Bickers, MD  Infectious Diseases [phone]      Allergies  Allergen Reactions  . Dapsone     REACTION: fever  . Megestrol   . Sulfamethoxazole-Trimethoprim     REACTION: fever    Current Outpatient Medications:  .  acetaminophen (TYLENOL) 500 MG tablet, Take 1 tablet (500 mg total) by mouth every 4 (four) hours as needed for mild pain, moderate pain or headache., Disp: 30 tablet, Rfl: 0 .  ENSURE (ENSURE), Take 1 Can by mouth 4 (four) times daily - after meals and at bedtime., Disp: 237 mL, Rfl: 11 .  erythromycin ophthalmic ointment, Place a 1/2 inch ribbon of ointment into the lower eyelid., Disp: 3.5 g, Rfl: 0 .  gabapentin (NEURONTIN) 400 MG capsule, TAKE 1 CAPSULE(400 MG) BY MOUTH FOUR TIMES DAILY, Disp: 360 capsule, Rfl: 0 .  magnesium chloride (SLOW-MAG) 64 MG TBEC SR tablet, Take 1 tablet (64 mg total) by mouth daily., Disp: 30 tablet, Rfl: 1 .  morphine (MS CONTIN) 30 MG 12 hr tablet, Take 3 tablets (90 mg  total) by mouth every 8 (eight) hours for 30 days., Disp: 270 tablet, Rfl: 0 .  Multiple Vitamin (MULTIVITAMIN WITH MINERALS) TABS tablet, Take 1 tablet by mouth daily., Disp: 30 tablet, Rfl: 1 .  olopatadine (PATANOL) 0.1 % ophthalmic solution, Place 1 drop into both eyes 2 (two) times daily., Disp: 5 mL, Rfl: 12 .  oxyCODONE (OXY IR/ROXICODONE) 5 MG immediate release tablet, Take 1 tablet (5 mg total) by mouth 2 (two) times daily as needed for up to 30 days for severe pain., Disp: 60 tablet, Rfl: 0 .  pantoprazole (PROTONIX) 40 MG tablet, Take 1 tablet (40 mg total) by mouth daily., Disp: 90 tablet, Rfl: 3 .  phosphorus (K PHOS NEUTRAL) 155-852-130 MG tablet, Take 3 tablets (750 mg total) by mouth 2 (two) times daily., Disp: 180 tablet, Rfl: 0 .  potassium chloride SA (K-DUR,KLOR-CON) 20 MEQ tablet, Take 1 tablet (20 mEq total) by mouth daily., Disp: 90 tablet, Rfl: 0 .  potassium citrate (UROCIT-K) 10 MEQ (1080 MG) SR tablet, Take 4 tablets (40 mEq total) by mouth 2 (two) times daily with a meal., Disp: 240 tablet, Rfl: 0 .  [name] 800-150 MG tablet, TAKE 1 TABLET BY MOUTH EVERY DAY DO NOT CRUSH BREAK OR CHEW TABLETS TAKE WITH FOOD, Disp: 30 tablet, Rfl: 5 .  SANTYL ointment, APPLY TOPICALLY TO THE AFFECTED AREA(S) DAILY, Disp: 30 g,  Rfl: 0 .  sodium bicarbonate 650 MG tablet, Take 2 tablets (1,300 mg total) by mouth 3 (three) times daily., Disp: 180 tablet, Rfl: 0 .  Tenofovir Alafenamide Fumarate (VEMLIDY) 25 MG TABS, Take 1 tablet (25 mg total) by mouth daily., Disp: 30 tablet, Rfl: 5 .  [name] 50 MG tablet, TAKE 1 TABLET BY MOUTH EVERY DAY, Disp: 30 tablet, Rfl: 5 .  warfarin (COUMADIN) 5 MG tablet, TAKE 1 AND 1/2 TABLETS BY MOUTH ON SUNDAY, TUESDAY, THURSDAY AND SATURDAY AND 1 TABLET ON ALL OTHER DAYS, Disp: 115 tablet, Rfl: 0 Past Medical History:  Diagnosis Date  . Chronic kidney disease   . History of chicken pox   . History of DVT of lower extremity    right  . HIV (human  immunodeficiency virus infection) ([place])   . Hyperlipidemia   . Left hip pain 03/09/2013  . Pneumonia   . Protein malnutrition ([place]) 08/2015   Social History   Socioeconomic History  . Marital status: Widowed    Spouse name: Not on file  . Number of children: 3  . Years of education: Not on file  . Highest education level: Not on file  Occupational History    Employer: UNEMPLOYED  Social Needs  . Financial resource strain: Not on file  . Food insecurity    Worry: Not on file    Inability: Not on file  . Transportation needs    Medical: Not on file    Non-medical: Not on file  Tobacco Use  . Smoking status: Current Every Day Smoker    Packs/day: 0.50    Years: 36.00    Pack years: 18.00    Types: Cigarettes  . Smokeless tobacco: Never Used  . Tobacco comment: ABOUT 1/2PPD.  Slowing down  Substance and Sexual Activity  . Alcohol use: No    Alcohol/week: 0.0 standard drinks  . Drug use: No  . Sexual activity: Not Currently    Comment: declined condoms  Lifestyle  . Physical activity    Days per week: Not on file    Minutes per session: Not on file  . Stress: Not on file  Relationships  . Social Herbalist on phone: Not on file    Gets together: Not on file    Attends religious service: Not on file    Active member of club or organization: Not on file    Attends meetings of clubs or organizations: Not on file    Relationship status: Not on file  Other Topics Concern  . Not on file  Social History Narrative  . Not on file   Family History  Problem Relation Age of Onset  . Arthritis Mother   . Kidney disease Maternal Grandfather     ASSESSMENT Recent Results: The most recent result is correlated with  mg per week: Lab Results  Component Value Date   INR 2.4 04/28/2019   INR 2.3 (H) 03/11/2019   INR 1.7 (H) 01/21/2019   PROTIME 38.4 (H) 04/01/2012    Anticoagulation Dosing: Description   Take 1 & 1/2 tablets of your peach-colored 5mg   strength warfarin tablets Sundays, Tuesdays, Thursdays and Saturdays; on Mondays, Wednesdays and Fridays,  ONLY 1 tablet.      INR today: Therapeutic  PLAN Weekly dose was increased by 6% to 45 mg per week  There are no Patient Instructions on file for this visit. Patient advised to contact clinic or seek medical attention if signs/symptoms of bleeding or  thromboembolism occur.  Patient verbalized understanding by repeating back information and was advised to contact me if further medication-related questions arise. Patient was also provided an information handout.  Follow-up No follow-ups on file.  [name], PharmD, CPP  15 minutes spent face-to-face with the patient during the encounter. 50% of time spent on education, including signs/sx bleeding and clotting, as well as food and drug interactions with warfarin. 50% of time was spent on fingerprick POC INR sample collection,processing, results determination, and documentation in http://www.kim.[email or web address]/.

## 2019-04-30 ENCOUNTER — Telehealth: Payer: Self-pay | Admitting: *Deleted

## 2019-04-30 ENCOUNTER — Other Ambulatory Visit: Payer: Self-pay | Admitting: Internal Medicine

## 2019-04-30 DIAGNOSIS — G894 Chronic pain syndrome: Secondary | ICD-10-CM

## 2019-04-30 DIAGNOSIS — G546 Phantom limb syndrome with pain: Secondary | ICD-10-CM

## 2019-04-30 MED ORDER — OXYCODONE HCL 5 MG PO TABS: 5.0000 mg | ORAL_TABLET | Freq: Two times a day (BID) | ORAL | 0 refills | Status: DC | PRN

## 2019-04-30 MED ORDER — MORPHINE SULFATE ER 30 MG PO TBCR: 90.0000 mg | EXTENDED_RELEASE_TABLET | Freq: Three times a day (TID) | ORAL | 0 refills | Status: AC

## 2019-04-30 NOTE — Telephone Encounter (Signed)
Prescriptions sent

## 2019-04-30 NOTE — Telephone Encounter (Signed)
Left message on [name]'s voicemail letting him know the prescriptions were sent. Thank you!

## 2019-04-30 NOTE — Telephone Encounter (Signed)
Patient is asking for refill of his Morphine and oxycodone prescriptions for this month.   Please send electronically to [place] at [facility]. [name], RN

## 2019-05-15 ENCOUNTER — Other Ambulatory Visit: Payer: Self-pay | Admitting: Internal Medicine

## 2019-05-15 DIAGNOSIS — G629 Polyneuropathy, unspecified: Secondary | ICD-10-CM

## 2019-05-22 ENCOUNTER — Other Ambulatory Visit: Payer: Self-pay

## 2019-05-22 ENCOUNTER — Ambulatory Visit: Payer: Medicare Other

## 2019-05-22 ENCOUNTER — Other Ambulatory Visit: Payer: Medicare Other

## 2019-05-22 DIAGNOSIS — B20 Human immunodeficiency virus [HIV] disease: Secondary | ICD-10-CM

## 2019-05-23 LAB — T-HELPER CELL (CD4) - (RCID CLINIC ONLY)
CD4 % Helper T Cell: 19 % — ABNORMAL LOW (ref 33–65)
CD4 T Cell Abs: 612 /uL (ref 400–1790)

## 2019-05-26 LAB — CBC
HCT: 55 % — ABNORMAL HIGH (ref 38.5–50.0)
Hemoglobin: 18.8 g/dL — ABNORMAL HIGH (ref 13.2–17.1)
MCH: 31.4 pg (ref 27.0–33.0)
MCHC: 34.2 g/dL (ref 32.0–36.0)
MCV: 91.8 fL (ref 80.0–100.0)
MPV: 10.2 fL (ref 7.5–12.5)
Platelets: 263 10*3/uL (ref 140–400)
RBC: 5.99 10*6/uL — ABNORMAL HIGH (ref 4.20–5.80)
RDW: 13.1 % (ref 11.0–15.0)
WBC: 7.2 10*3/uL (ref 3.8–10.8)

## 2019-05-26 LAB — HIV-1 RNA QUANT-NO REFLEX-BLD
HIV 1 RNA Quant: <20 copies/mL
HIV-1 RNA Quant, Log: <1.3 Log copies/mL

## 2019-05-27 ENCOUNTER — Other Ambulatory Visit: Payer: Self-pay | Admitting: *Deleted

## 2019-05-27 NOTE — Telephone Encounter (Signed)
Last anti-coag visit 04/25/19 with instructions to return around 8/10

## 2019-05-28 MED ORDER — WARFARIN SODIUM 5 MG PO TABS: ORAL_TABLET | ORAL | 0 refills | Status: DC

## 2019-06-02 ENCOUNTER — Telehealth: Payer: Self-pay | Admitting: *Deleted

## 2019-06-02 NOTE — Telephone Encounter (Signed)
Patient is calling for monthly pain medication refill of Oxycodone 5mg  #60 (takes BID) and Morphine (MS Contin) 30 mg #270 (takes TID) to be sent to [facility] at [address].  His daughter will pick this up today if possible. [name], RN

## 2019-06-03 ENCOUNTER — Telehealth: Payer: Self-pay | Admitting: *Deleted

## 2019-06-03 ENCOUNTER — Telehealth: Payer: Self-pay | Admitting: Family

## 2019-06-03 ENCOUNTER — Other Ambulatory Visit: Payer: Self-pay | Admitting: Internal Medicine

## 2019-06-03 DIAGNOSIS — G894 Chronic pain syndrome: Secondary | ICD-10-CM

## 2019-06-03 MED ORDER — MORPHINE SULFATE ER 30 MG PO TBCR: 30.0000 mg | EXTENDED_RELEASE_TABLET | Freq: Three times a day (TID) | ORAL | 0 refills | Status: DC

## 2019-06-03 MED ORDER — OXYCODONE HCL 5 MG PO TABS: 5.0000 mg | ORAL_TABLET | Freq: Two times a day (BID) | ORAL | 0 refills | Status: DC | PRN

## 2019-06-03 NOTE — Telephone Encounter (Signed)
error 

## 2019-06-03 NOTE — Telephone Encounter (Signed)
Patient came to clinic today to pick up his Rx. Had to ask Baxter Flattery to send in for him as his provider is not available. Medication sent to pharmacy and patient notified.

## 2019-06-03 NOTE — Progress Notes (Signed)
Gave refills for ms contin 30mg  tid plus oxy 5 bid for breakthrough for 1 month supply while dr Megan Salon is out of office to cover for this month's refills

## 2019-06-03 NOTE — Telephone Encounter (Signed)
Error

## 2019-06-09 ENCOUNTER — Encounter (INDEPENDENT_AMBULATORY_CARE_PROVIDER_SITE_OTHER): Payer: Self-pay

## 2019-06-09 ENCOUNTER — Ambulatory Visit (INDEPENDENT_AMBULATORY_CARE_PROVIDER_SITE_OTHER): Payer: Medicare Other | Admitting: Pharmacist

## 2019-06-09 ENCOUNTER — Other Ambulatory Visit: Payer: Self-pay

## 2019-06-09 DIAGNOSIS — Z7901 Long term (current) use of anticoagulants: Secondary | ICD-10-CM

## 2019-06-09 DIAGNOSIS — Z86718 Personal history of other venous thrombosis and embolism: Secondary | ICD-10-CM

## 2019-06-09 DIAGNOSIS — Z5181 Encounter for therapeutic drug level monitoring: Secondary | ICD-10-CM | POA: Diagnosis not present

## 2019-06-09 LAB — POCT INR: INR: 2.2 (ref 2.0–3.0)

## 2019-06-09 NOTE — Progress Notes (Signed)
INTERNAL MEDICINE TEACHING ATTENDING ADDENDUM  I agree with pharmacy recommendations as outlined in their note.   Alexander N Raines, MD  

## 2019-06-09 NOTE — Progress Notes (Signed)
Anticoagulation Management [name] is a 58 y.o. male who reports to the clinic for monitoring of warfarin treatment.    Indication: DVT, History of; Long term current use of anticoagulant.   Duration: indefinite Supervising physician: Lenice Pressman, MD, PhD  Anticoagulation Clinic Visit History: Patient does not report signs/symptoms of bleeding or thromboembolism  Other recent changes: No diet, medications, lifestyle changes endorsed at this visit by the patient.  Anticoagulation Episode Summary    Current INR goal:  2.0-3.0  TTR:  71.7 % (8.4 y)  Next INR check:  07/21/2019  INR from last check:  2.2 (06/09/2019)  Weekly max warfarin dose:    Target end date:  Indefinite  INR check location:  Anticoagulation Clinic  Preferred lab:    Send INR reminders to:  [facility]   Indications   History of DVT (deep vein thrombosis) [Z86.718] Long term current use of anticoagulant [Z79.01]       Comments:        Anticoagulation Care Providers    Provider Role Specialty Phone number   Michel Bickers, MD  Infectious Diseases [phone]      Allergies  Allergen Reactions  . Dapsone     REACTION: fever  . Megestrol   . Sulfamethoxazole-Trimethoprim     REACTION: fever    Current Outpatient Medications:  .  ENSURE (ENSURE), Take 1 Can by mouth 4 (four) times daily - after meals and at bedtime., Disp: 237 mL, Rfl: 11 .  gabapentin (NEURONTIN) 400 MG capsule, TAKE 1 CAPSULE(400 MG) BY MOUTH FOUR TIMES DAILY, Disp: 360 capsule, Rfl: 1 .  morphine (MS CONTIN) 30 MG 12 hr tablet, Take 1 tablet (30 mg total) by mouth 3 (three) times daily., Disp: 90 tablet, Rfl: 0 .  olopatadine (PATANOL) 0.1 % ophthalmic solution, Place 1 drop into both eyes 2 (two) times daily., Disp: 5 mL, Rfl: 12 .  oxyCODONE (OXY IR/ROXICODONE) 5 MG immediate release tablet, Take 1 tablet (5 mg total) by mouth 2 (two) times daily as needed for severe pain., Disp: 60 tablet, Rfl: 0 .  pantoprazole  (PROTONIX) 40 MG tablet, Take 1 tablet (40 mg total) by mouth daily., Disp: 90 tablet, Rfl: 3 .  [name] 800-150 MG tablet, TAKE 1 TABLET BY MOUTH EVERY DAY DO NOT CRUSH BREAK OR CHEW TABLETS TAKE WITH FOOD, Disp: 30 tablet, Rfl: 5 .  Tenofovir Alafenamide Fumarate (VEMLIDY) 25 MG TABS, Take 1 tablet (25 mg total) by mouth daily., Disp: 30 tablet, Rfl: 5 .  [name] 50 MG tablet, TAKE 1 TABLET BY MOUTH EVERY DAY, Disp: 30 tablet, Rfl: 5 .  warfarin (COUMADIN) 5 MG tablet, TAKE 1 AND 1/2 TABLETS BY MOUTH ON SUNDAY, TUESDAY, THURSDAY AND SATURDAY AND 1 TABLET ON ALL OTHER DAYS, Disp: 115 tablet, Rfl: 0 .  acetaminophen (TYLENOL) 500 MG tablet, Take 1 tablet (500 mg total) by mouth every 4 (four) hours as needed for mild pain, moderate pain or headache. (Patient not taking: Reported on 06/09/2019), Disp: 30 tablet, Rfl: 0 .  erythromycin ophthalmic ointment, Place a 1/2 inch ribbon of ointment into the lower eyelid. (Patient not taking: Reported on 06/09/2019), Disp: 3.5 g, Rfl: 0 .  magnesium chloride (SLOW-MAG) 64 MG TBEC SR tablet, Take 1 tablet (64 mg total) by mouth daily. (Patient not taking: Reported on 06/09/2019), Disp: 30 tablet, Rfl: 1 .  Multiple Vitamin (MULTIVITAMIN WITH MINERALS) TABS tablet, Take 1 tablet by mouth daily. (Patient not taking: Reported on 06/09/2019), Disp: 30 tablet, Rfl:  1 .  phosphorus (K PHOS NEUTRAL) 155-852-130 MG tablet, Take 3 tablets (750 mg total) by mouth 2 (two) times daily. (Patient not taking: Reported on 06/09/2019), Disp: 180 tablet, Rfl: 0 .  potassium chloride SA (K-DUR,KLOR-CON) 20 MEQ tablet, Take 1 tablet (20 mEq total) by mouth daily. (Patient not taking: Reported on 06/09/2019), Disp: 90 tablet, Rfl: 0 .  potassium citrate (UROCIT-K) 10 MEQ (1080 MG) SR tablet, Take 4 tablets (40 mEq total) by mouth 2 (two) times daily with a meal. (Patient not taking: Reported on 06/09/2019), Disp: 240 tablet, Rfl: 0 .  SANTYL ointment, APPLY TOPICALLY TO THE AFFECTED AREA(S)  DAILY (Patient not taking: Reported on 06/09/2019), Disp: 30 g, Rfl: 0 .  sodium bicarbonate 650 MG tablet, Take 2 tablets (1,300 mg total) by mouth 3 (three) times daily. (Patient not taking: Reported on 06/09/2019), Disp: 180 tablet, Rfl: 0 Past Medical History:  Diagnosis Date  . Chronic kidney disease   . History of chicken pox   . History of DVT of lower extremity    right  . HIV (human immunodeficiency virus infection) ([place])   . Hyperlipidemia   . Left hip pain 03/09/2013  . Pneumonia   . Protein malnutrition ([place]) 08/2015   Social History   Socioeconomic History  . Marital status: Widowed    Spouse name: Not on file  . Number of children: 3  . Years of education: Not on file  . Highest education level: Not on file  Occupational History    Employer: UNEMPLOYED  Social Needs  . Financial resource strain: Not on file  . Food insecurity    Worry: Not on file    Inability: Not on file  . Transportation needs    Medical: Not on file    Non-medical: Not on file  Tobacco Use  . Smoking status: Current Every Day Smoker    Packs/day: 0.50    Years: 36.00    Pack years: 18.00    Types: Cigarettes  . Smokeless tobacco: Never Used  . Tobacco comment: ABOUT 1/2PPD.  Slowing down  Substance and Sexual Activity  . Alcohol use: No    Alcohol/week: 0.0 standard drinks  . Drug use: No  . Sexual activity: Not Currently    Comment: declined condoms  Lifestyle  . Physical activity    Days per week: Not on file    Minutes per session: Not on file  . Stress: Not on file  Relationships  . Social Herbalist on phone: Not on file    Gets together: Not on file    Attends religious service: Not on file    Active member of club or organization: Not on file    Attends meetings of clubs or organizations: Not on file    Relationship status: Not on file  Other Topics Concern  . Not on file  Social History Narrative  . Not on file   Family History  Problem Relation Age of  Onset  . Arthritis Mother   . Kidney disease Maternal Grandfather     ASSESSMENT Recent Results: The most recent result is correlated with 45 mg per week: Lab Results  Component Value Date   INR 2.2 06/09/2019   INR 2.4 04/28/2019   INR 2.3 (H) 03/11/2019   PROTIME 38.4 (H) 04/01/2012    Anticoagulation Dosing: Description   Take 1 & 1/2 tablets of your peach-colored 5mg  strength warfarin tablets Sundays, Tuesdays, Thursdays and Saturdays; on Mondays, Wednesdays and  Fridays,  ONLY 1 tablet.      INR today: Therapeutic  PLAN Weekly dose was unchanged.    Patient Instructions  Patient instructed to take medications as defined in the Anti-coagulation Track section of this encounter.  Patient instructed to take today's dose.  Patient instructed to take 1 & 1/2 tablets of your peach-colored 5mg  strength warfarin tablets Sundays, Tuesdays, Thursdays and Saturdays; on Mondays, Wednesdays and Fridays,  ONLY 1 tablet. Patient verbalized understanding of these instructions.     Patient advised to contact clinic or seek medical attention if signs/symptoms of bleeding or thromboembolism occur.  Patient verbalized understanding by repeating back information and was advised to contact me if further medication-related questions arise. Patient was also provided an information handout.  Follow-up Return in 6 weeks (on 07/21/2019) for Follow up INR.  [name], PharmD, CPP  15 minutes spent face-to-face with the patient during the encounter. 50% of time spent on education, including signs/sx bleeding and clotting, as well as food and drug interactions with warfarin. 50% of time was spent on fingerprick POC INR sample collection,processing, results determination, and documentation in http://www.kim.net/.

## 2019-06-09 NOTE — Patient Instructions (Signed)
Patient instructed to take medications as defined in the Anti-coagulation Track section of this encounter.  Patient instructed to take today's dose.  Patient instructed to take 1 & 1/2 tablets of your peach-colored 5mg strength warfarin tablets Sundays, Tuesdays, Thursdays and Saturdays; on Mondays, Wednesdays and Fridays,  ONLY 1 tablet.  Patient verbalized understanding of these instructions.   

## 2019-06-10 ENCOUNTER — Other Ambulatory Visit: Payer: Self-pay | Admitting: *Deleted

## 2019-06-12 ENCOUNTER — Ambulatory Visit (INDEPENDENT_AMBULATORY_CARE_PROVIDER_SITE_OTHER): Payer: Medicare Other | Admitting: Internal Medicine

## 2019-06-12 ENCOUNTER — Other Ambulatory Visit: Payer: Self-pay

## 2019-06-12 ENCOUNTER — Encounter: Payer: Self-pay | Admitting: Internal Medicine

## 2019-06-12 DIAGNOSIS — F17200 Nicotine dependence, unspecified, uncomplicated: Secondary | ICD-10-CM

## 2019-06-12 DIAGNOSIS — G546 Phantom limb syndrome with pain: Secondary | ICD-10-CM

## 2019-06-12 DIAGNOSIS — B20 Human immunodeficiency virus [HIV] disease: Secondary | ICD-10-CM | POA: Diagnosis not present

## 2019-06-12 MED ORDER — PANTOPRAZOLE SODIUM 40 MG PO TBEC: 40.0000 mg | DELAYED_RELEASE_TABLET | Freq: Every day | ORAL | 3 refills | Status: DC

## 2019-06-12 MED ORDER — MORPHINE SULFATE ER 30 MG PO TBCR: 90.0000 mg | EXTENDED_RELEASE_TABLET | Freq: Three times a day (TID) | ORAL | 0 refills | Status: DC

## 2019-06-12 NOTE — Assessment & Plan Note (Signed)
I reviewed his MS Contin prescriptions with our pharmacist.  He is correct that he was shorted on this month's prescription.  In the past 6 years he has been on 90 mg 3 times daily receiving 270 tablets each month.  He was given only 90 tablets on 06/03/2019.  Sent in a prescription for 180 tablets to allow him to complete this month.  Next month I will give him his usual dose.  There has never been any evidence of him misusing his pain medication and his pain is currently well controlled.

## 2019-06-12 NOTE — Progress Notes (Signed)
Patient Active Problem List   Diagnosis Date Noted  . Phantom limb syndrome with pain ([place]) 03/21/2012    Priority: High  . Long term current use of anticoagulant 11/20/2010    Priority: High  . History of DVT (deep vein thrombosis) 07/19/2009    Priority: High  . Human immunodeficiency virus (HIV) disease ([place]) 11/10/2006    Priority: High  . DYSLIPIDEMIA 11/10/2006    Priority: High  . CIGARETTE SMOKER 11/10/2006    Priority: High  . Prosthesis adjustment 07/20/2017  . Long term current use of opiate analgesic 12/17/2015  . Pressure ulcer 09/13/2015  . Protein-calorie malnutrition, severe 09/13/2015  . Avascular necrosis of femoral head ([place]) 05/14/2015  . Left adrenal mass ([place]) 05/14/2015  . Preventative health care 03/09/2013  . ERECTILE DYSFUNCTION, ORGANIC 04/12/2010  . GERD 08/23/2009  . HYPOGONADISM 07/15/2009  . DEFICIENCY OF OTHER VITAMINS 07/15/2009  . Pulmonary nodule 07/15/2009  . Status post above knee amputation ([place]) 07/15/2009  . SHINGLES, RECURRENT 11/10/2006  . Alcohol abuse 11/10/2006  . PERIPHERAL NEUROPATHY 11/10/2006  . ALLERGIC RHINITIS 11/10/2006    Patient's Medications  New Prescriptions   No medications on file  Previous Medications   ACETAMINOPHEN (TYLENOL) 500 MG TABLET    Take 1 tablet (500 mg total) by mouth every 4 (four) hours as needed for mild pain, moderate pain or headache.   ENSURE (ENSURE)    Take 1 Can by mouth 4 (four) times daily - after meals and at bedtime.   ERYTHROMYCIN OPHTHALMIC OINTMENT    Place a 1/2 inch ribbon of ointment into the lower eyelid.   GABAPENTIN (NEURONTIN) 400 MG CAPSULE    TAKE 1 CAPSULE(400 MG) BY MOUTH FOUR TIMES DAILY   MAGNESIUM CHLORIDE (SLOW-MAG) 64 MG TBEC SR TABLET    Take 1 tablet (64 mg total) by mouth daily.   MULTIPLE VITAMIN (MULTIVITAMIN WITH MINERALS) TABS TABLET    Take 1 tablet by mouth daily.   OLOPATADINE (PATANOL) 0.1 % OPHTHALMIC SOLUTION    Place 1 drop into both eyes 2  (two) times daily.   OXYCODONE (OXY IR/ROXICODONE) 5 MG IMMEDIATE RELEASE TABLET    Take 1 tablet (5 mg total) by mouth 2 (two) times daily as needed for severe pain.   PANTOPRAZOLE (PROTONIX) 40 MG TABLET    Take 1 tablet (40 mg total) by mouth daily.   PHOSPHORUS (K PHOS NEUTRAL) 155-852-130 MG TABLET    Take 3 tablets (750 mg total) by mouth 2 (two) times daily.   POTASSIUM CHLORIDE SA (K-DUR,KLOR-CON) 20 MEQ TABLET    Take 1 tablet (20 mEq total) by mouth daily.   POTASSIUM CITRATE (UROCIT-K) 10 MEQ (1080 MG) SR TABLET    Take 4 tablets (40 mEq total) by mouth 2 (two) times daily with a meal.   [name] 800-150 MG TABLET    TAKE 1 TABLET BY MOUTH EVERY DAY DO NOT CRUSH BREAK OR CHEW TABLETS TAKE WITH FOOD   SANTYL OINTMENT    APPLY TOPICALLY TO THE AFFECTED AREA(S) DAILY   SODIUM BICARBONATE 650 MG TABLET    Take 2 tablets (1,300 mg total) by mouth 3 (three) times daily.   TENOFOVIR ALAFENAMIDE FUMARATE (VEMLIDY) 25 MG TABS    Take 1 tablet (25 mg total) by mouth daily.   [name] 50 MG TABLET    TAKE 1 TABLET BY MOUTH EVERY DAY   WARFARIN (COUMADIN) 5 MG TABLET    TAKE 1 AND  1/2 TABLETS BY MOUTH ON SUNDAY, TUESDAY, THURSDAY AND SATURDAY AND 1 TABLET ON ALL OTHER DAYS  Modified Medications   Modified Medication Previous Medication   MORPHINE (MS CONTIN) 30 MG 12 HR TABLET morphine (MS CONTIN) 30 MG 12 hr tablet      Take 3 tablets (90 mg total) by mouth 3 (three) times daily.    Take 1 tablet (30 mg total) by mouth 3 (three) times daily.  Discontinued Medications   No medications on file    Subjective: Cecilia is in for his routine HIV follow-up visit.  He has had no problems taking or tolerating his Vemlidy, [name] or [name].  He is only missed 3 doses since his last visit.  That occurred last month when his pharmacy was 3 days late filling his prescription.  They blamed it on the [place] pandemic.  He tells me that he did not receive his usual supply of morphine sulfate and was only given  enough to last 10 days.  He says that he will run out tomorrow.  His pain has been under good control on his current dose of medications.  He continues to smoke a pack of cigarettes daily and has no intention of quitting.  Review of Systems: Review of Systems  Constitutional: Negative for chills, diaphoresis, fever, malaise/fatigue and weight loss.  HENT: Negative for sore throat.   Respiratory: Negative for cough, sputum production and shortness of breath.   Cardiovascular: Negative for chest pain.  Gastrointestinal: Negative for abdominal pain, diarrhea, heartburn, nausea and vomiting.  Genitourinary: Negative for dysuria and frequency.  Musculoskeletal: Positive for joint pain. Negative for myalgias.  Skin: Negative for rash.  Neurological: Positive for sensory change and focal weakness. Negative for dizziness and headaches.  Psychiatric/Behavioral: Negative for depression and substance abuse. The patient is not nervous/anxious.     Past Medical History:  Diagnosis Date  . Chronic kidney disease   . History of chicken pox   . History of DVT of lower extremity    right  . HIV (human immunodeficiency virus infection) ([place])   . Hyperlipidemia   . Left hip pain 03/09/2013  . Pneumonia   . Protein malnutrition ([place]) 08/2015    Social History   Tobacco Use  . Smoking status: Current Every Day Smoker    Packs/day: 0.50    Years: 36.00    Pack years: 18.00    Types: Cigarettes  . Smokeless tobacco: Never Used  . Tobacco comment: ABOUT 1/2PPD.  Slowing down  Substance Use Topics  . Alcohol use: No    Alcohol/week: 0.0 standard drinks  . Drug use: No    Family History  Problem Relation Age of Onset  . Arthritis Mother   . Kidney disease Maternal Grandfather     Allergies  Allergen Reactions  . Dapsone     REACTION: fever  . Megestrol   . Sulfamethoxazole-Trimethoprim     REACTION: fever    Health Maintenance  Topic Date Due  . COLONOSCOPY  03/18/2011  . COLON  CANCER SCREENING ANNUAL FOBT  12/20/2018  . INFLUENZA VACCINE  05/31/2019  . TETANUS/TDAP  10/08/2024  . Hepatitis C Screening  Completed  . HIV Screening  Completed    Objective:  Vitals:   06/12/19 1011  BP: 138/67  Pulse: (!) 102  Temp: 99 F (37.2 C)   There is no height or weight on file to calculate BMI.  Physical Exam Constitutional:      Comments: He is seated in  his wheelchair.  He has all of his medications with him.  HENT:     Mouth/Throat:     Pharynx: No oropharyngeal exudate.  Eyes:     Conjunctiva/sclera: Conjunctivae normal.  Cardiovascular:     Rate and Rhythm: Normal rate and regular rhythm.     Heart sounds: No murmur.  Pulmonary:     Effort: Pulmonary effort is normal.     Breath sounds: Normal breath sounds.  Abdominal:     Palpations: Abdomen is soft. There is no mass.     Tenderness: There is no abdominal tenderness.  Musculoskeletal: Normal range of motion.        General: No swelling or tenderness.  Skin:    Findings: No rash.  Neurological:     Mental Status: He is alert and oriented to person, place, and time.  Psychiatric:        Mood and Affect: Mood normal.     Lab Results Lab Results  Component Value Date   WBC 7.2 05/22/2019   HGB 18.8 (H) 05/22/2019   HCT 55.0 (H) 05/22/2019   MCV 91.8 05/22/2019   PLT 263 05/22/2019    Lab Results  Component Value Date   CREATININE 1.24 11/25/2018   BUN 11 11/25/2018   NA 138 11/25/2018   K 3.7 11/25/2018   CL 98 11/25/2018   CO2 30 11/25/2018    Lab Results  Component Value Date   ALT 16 11/25/2018   AST 18 11/25/2018   ALKPHOS 408 (H) 05/22/2017   BILITOT 0.7 11/25/2018    Lab Results  Component Value Date   CHOL 211 (H) 05/16/2016   HDL 40 05/16/2016   LDLCALC 133 (H) 05/16/2016   TRIG 189 (H) 05/16/2016   CHOLHDL 5.3 (H) 05/16/2016   Lab Results  Component Value Date   LABRPR NON-REACTIVE 11/25/2018   HIV 1 RNA Quant (copies/mL)  Date Value  05/22/2019 <20 NOT  DETECTED  11/25/2018 <20 NOT DETECTED  05/23/2018 <20 NOT DETECTED   CD4 T Cell Abs (/uL)  Date Value  05/22/2019 612  11/25/2018 610  05/23/2018 920     Problem List Items Addressed This Visit      High   Phantom limb syndrome with pain ([place])    I reviewed his MS Contin prescriptions with our pharmacist.  He is correct that he was shorted on this month's prescription.  In the past 6 years he has been on 90 mg 3 times daily receiving 270 tablets each month.  He was given only 90 tablets on 06/03/2019.  Sent in a prescription for 180 tablets to allow him to complete this month.  Next month I will give him his usual dose.  There has never been any evidence of him misusing his pain medication and his pain is currently well controlled.      Human immunodeficiency virus (HIV) disease ([place])    His infection remains under excellent, long-term control.  I will continue his current antiretroviral regimen and see him back after lab work in 6 months.      Relevant Orders   T-helper cell (CD4)- ([facility] clinic only)   HIV-1 RNA quant-no reflex-bld   CBC   Comprehensive metabolic panel   RPR   CIGARETTE SMOKER    Despite many conversations about the dangers of continued smoking he has little insight and is currently not ready to contemplate trying to quit.           Michel Bickers, MD [facility] [phone] pager   [phone] cell 06/12/2019, 11:07 AM

## 2019-06-12 NOTE — Assessment & Plan Note (Signed)
His infection remains under excellent, long-term control.  I will continue his current antiretroviral regimen and see him back after lab work in 6 months.

## 2019-06-12 NOTE — Assessment & Plan Note (Signed)
Despite many conversations about the dangers of continued smoking he has little insight and is currently not ready to contemplate trying to quit.

## 2019-07-01 ENCOUNTER — Other Ambulatory Visit: Payer: Self-pay | Admitting: Internal Medicine

## 2019-07-01 ENCOUNTER — Telehealth: Payer: Self-pay

## 2019-07-01 DIAGNOSIS — G894 Chronic pain syndrome: Secondary | ICD-10-CM

## 2019-07-01 MED ORDER — OXYCODONE HCL 5 MG PO TABS: 5.0000 mg | ORAL_TABLET | Freq: Two times a day (BID) | ORAL | 0 refills | Status: DC | PRN

## 2019-07-01 MED ORDER — MORPHINE SULFATE ER 30 MG PO TBCR: 90.0000 mg | EXTENDED_RELEASE_TABLET | Freq: Three times a day (TID) | ORAL | 0 refills | Status: DC

## 2019-07-01 NOTE — Telephone Encounter (Signed)
I refilled them. Thanks.

## 2019-07-01 NOTE — Telephone Encounter (Signed)
Patient requesting refills pain medications (oxydodone and morphine) routing message to provider.  [name]

## 2019-07-03 ENCOUNTER — Other Ambulatory Visit: Payer: Self-pay

## 2019-07-03 ENCOUNTER — Other Ambulatory Visit: Payer: Self-pay | Admitting: Internal Medicine

## 2019-07-03 ENCOUNTER — Telehealth: Payer: Self-pay

## 2019-07-03 MED ORDER — MORPHINE SULFATE ER 30 MG PO TBCR: 90.0000 mg | EXTENDED_RELEASE_TABLET | Freq: Three times a day (TID) | ORAL | 0 refills | Status: DC

## 2019-07-03 NOTE — Telephone Encounter (Signed)
Per Dr. Megan Salon prescription for Morphine is correct. Pharmacy will need prescription sent Electronically.  [place]

## 2019-07-03 NOTE — Telephone Encounter (Signed)
Patient called office today stating he has not received his Morphine 30 mg prescription. Patient's sister was only able to pick up Oxycodone from pharmacy yesterday. Patient states that last refill was incorrect, and would like to confirm correct number of tablets is dispensed. Called [facility] to confirm when patient last picked up Morphine prescription. Pharmacist states last prescription was dispensed on 8/14.  Will route message to MD to confirm prescription before filling. [place]

## 2019-07-21 ENCOUNTER — Ambulatory Visit: Payer: Medicare Other

## 2019-07-22 ENCOUNTER — Encounter: Payer: Self-pay | Admitting: Internal Medicine

## 2019-07-22 ENCOUNTER — Ambulatory Visit (INDEPENDENT_AMBULATORY_CARE_PROVIDER_SITE_OTHER): Payer: Medicare Other | Admitting: Pharmacist

## 2019-07-22 ENCOUNTER — Ambulatory Visit (INDEPENDENT_AMBULATORY_CARE_PROVIDER_SITE_OTHER): Payer: Medicare Other | Admitting: Internal Medicine

## 2019-07-22 ENCOUNTER — Other Ambulatory Visit: Payer: Self-pay

## 2019-07-22 VITALS — BP 115/69 | HR 96 | Temp 99.6°F | Wt 129.5 lb

## 2019-07-22 DIAGNOSIS — Z7901 Long term (current) use of anticoagulants: Secondary | ICD-10-CM

## 2019-07-22 DIAGNOSIS — F17210 Nicotine dependence, cigarettes, uncomplicated: Secondary | ICD-10-CM

## 2019-07-22 DIAGNOSIS — G546 Phantom limb syndrome with pain: Secondary | ICD-10-CM | POA: Diagnosis not present

## 2019-07-22 DIAGNOSIS — Z79899 Other long term (current) drug therapy: Secondary | ICD-10-CM | POA: Diagnosis not present

## 2019-07-22 DIAGNOSIS — Z5181 Encounter for therapeutic drug level monitoring: Secondary | ICD-10-CM | POA: Diagnosis not present

## 2019-07-22 DIAGNOSIS — F17200 Nicotine dependence, unspecified, uncomplicated: Secondary | ICD-10-CM

## 2019-07-22 DIAGNOSIS — Z86718 Personal history of other venous thrombosis and embolism: Secondary | ICD-10-CM

## 2019-07-22 DIAGNOSIS — Z0000 Encounter for general adult medical examination without abnormal findings: Secondary | ICD-10-CM

## 2019-07-22 LAB — POCT INR: INR: 2.3 (ref 2.0–3.0)

## 2019-07-22 NOTE — Assessment & Plan Note (Signed)
Today we spoke with [name] about his smoking history. He states that he has been smoking since we was 58 years old. He states that he does not have a current plan to discontinue smoking. He states that Chantix gives him nightmares, and when he was on it, "it made my mind break." He states that he does not smoke a pack a day, but does endorse smoke about 6 packs of cigarettes a week. He states that if he plans to stop smoking he will do it "cold [place]." - Assess for tobacco cessation at subsequent office visits. Provide motivational interviewing.

## 2019-07-22 NOTE — Patient Instructions (Signed)
It was a pleasure meeting you today [name]. Today we spoke about the renewal of your wheelchair. When you need to request a replacement, please tell us that is the requirement for your visit so we can prepare the correct paperwork. We also spoke about your tobacco use today and the need for smoking cessation. We also addressed your limb pain. If you need [place] to refill your medications, please reach out to [place]. I look forward to working with you again.
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
## 2019-07-22 NOTE — Assessment & Plan Note (Signed)
[name] is a 58 y/o male who presents to  the clinic for health maintenance. We discussed his vitals, and to continue taking his medications. We also discussed the need for monitoring him for colon cancer. He states that he will decline the colonoscopy. He denies the colonoscopy after seeing his wife die from colon cancer, and attributing it to the colonoscopy. He instead elected for the FOBT. Patient states he will also receive his flu vaccine when he meets with Dr. Megan Salon at his next office visit. Patient also states that he is almost at his five year limit for a new electronic wheelchair. We discussed the need to call the clinic specifically for the wheelchair appointment to make sure we have the appropriate staff and paperwork.  - Follow up with FOBT at time of next visit.  - Patient instructed to call the clinic for reevaluation of a motorized wheelchair.

## 2019-07-22 NOTE — Assessment & Plan Note (Signed)
Today we discussed [name] phantom limb syndrome with associated pain. While speaking with [name], he states that his pain is currently well managed with his dosing of neurontin 400 mg. He states that he has no need for refills at this moment in time, and was told to reach out to the clinic for refills or schedule an appointment for an increase in pain.  - Continue taking Neurontin 400 mg QID

## 2019-07-22 NOTE — Patient Instructions (Signed)
Patient instructed to take medications as defined in the Anti-coagulation Track section of this encounter.  Patient instructed to take today's dose.  Patient instructed to take 1 & 1/2 tablets of your peach-colored 5mg strength warfarin tablets Sundays, Tuesdays, Thursdays and Saturdays; on Mondays, Wednesdays and Fridays,  ONLY 1 tablet.  Patient verbalized understanding of these instructions.   

## 2019-07-22 NOTE — Progress Notes (Signed)
Anticoagulation Management [name] is a 58 y.o. male who reports to the clinic for monitoring of warfarin treatment.    Indication: DVT, History of; long term current use of anticoagulant.   Duration: indefinite Supervising physician: Velna Ochs, MD  Anticoagulation Clinic Visit History: Patient does not report signs/symptoms of bleeding or thromboembolism  Other recent changes: No diet, medications, lifestyle changes endorsed.  Anticoagulation Episode Summary    Current INR goal:  2.0-3.0  TTR:  72.1 % (8.5 y)  Next INR check:  09/01/2019  INR from last check:  2.3 (07/22/2019)  Weekly max warfarin dose:    Target end date:  Indefinite  INR check location:  Anticoagulation Clinic  Preferred lab:    Send INR reminders to:  [facility]   Indications   History of DVT (deep vein thrombosis) [Z86.718] Long term current use of anticoagulant [Z79.01]       Comments:        Anticoagulation Care Providers    Provider Role Specialty Phone number   Michel Bickers, MD  Infectious Diseases [phone]      Allergies  Allergen Reactions  . Dapsone     REACTION: fever  . Megestrol   . Sulfamethoxazole-Trimethoprim     REACTION: fever    Current Outpatient Medications:  .  acetaminophen (TYLENOL) 500 MG tablet, Take 1 tablet (500 mg total) by mouth every 4 (four) hours as needed for mild pain, moderate pain or headache., Disp: 30 tablet, Rfl: 0 .  ENSURE (ENSURE), Take 1 Can by mouth 4 (four) times daily - after meals and at bedtime., Disp: 237 mL, Rfl: 11 .  erythromycin ophthalmic ointment, Place a 1/2 inch ribbon of ointment into the lower eyelid., Disp: 3.5 g, Rfl: 0 .  gabapentin (NEURONTIN) 400 MG capsule, TAKE 1 CAPSULE(400 MG) BY MOUTH FOUR TIMES DAILY, Disp: 360 capsule, Rfl: 1 .  magnesium chloride (SLOW-MAG) 64 MG TBEC SR tablet, Take 1 tablet (64 mg total) by mouth daily., Disp: 30 tablet, Rfl: 1 .  morphine (MS CONTIN) 30 MG 12 hr tablet, Take 3 tablets  (90 mg total) by mouth 3 (three) times daily., Disp: 270 tablet, Rfl: 0 .  Multiple Vitamin (MULTIVITAMIN WITH MINERALS) TABS tablet, Take 1 tablet by mouth daily., Disp: 30 tablet, Rfl: 1 .  olopatadine (PATANOL) 0.1 % ophthalmic solution, Place 1 drop into both eyes 2 (two) times daily., Disp: 5 mL, Rfl: 12 .  oxyCODONE (OXY IR/ROXICODONE) 5 MG immediate release tablet, Take 1 tablet (5 mg total) by mouth 2 (two) times daily as needed for severe pain., Disp: 60 tablet, Rfl: 0 .  pantoprazole (PROTONIX) 40 MG tablet, Take 1 tablet (40 mg total) by mouth daily., Disp: 90 tablet, Rfl: 3 .  phosphorus (K PHOS NEUTRAL) 155-852-130 MG tablet, Take 3 tablets (750 mg total) by mouth 2 (two) times daily., Disp: 180 tablet, Rfl: 0 .  potassium chloride SA (K-DUR,KLOR-CON) 20 MEQ tablet, Take 1 tablet (20 mEq total) by mouth daily., Disp: 90 tablet, Rfl: 0 .  potassium citrate (UROCIT-K) 10 MEQ (1080 MG) SR tablet, Take 4 tablets (40 mEq total) by mouth 2 (two) times daily with a meal., Disp: 240 tablet, Rfl: 0 .  [name] 800-150 MG tablet, TAKE 1 TABLET BY MOUTH EVERY DAY DO NOT CRUSH BREAK OR CHEW TABLETS TAKE WITH FOOD, Disp: 30 tablet, Rfl: 5 .  SANTYL ointment, APPLY TOPICALLY TO THE AFFECTED AREA(S) DAILY, Disp: 30 g, Rfl: 0 .  sodium bicarbonate 650 MG tablet,  Take 2 tablets (1,300 mg total) by mouth 3 (three) times daily., Disp: 180 tablet, Rfl: 0 .  Tenofovir Alafenamide Fumarate (VEMLIDY) 25 MG TABS, Take 1 tablet (25 mg total) by mouth daily., Disp: 30 tablet, Rfl: 5 .  [name] 50 MG tablet, TAKE 1 TABLET BY MOUTH EVERY DAY, Disp: 30 tablet, Rfl: 5 .  warfarin (COUMADIN) 5 MG tablet, TAKE 1 AND 1/2 TABLETS BY MOUTH ON SUNDAY, TUESDAY, THURSDAY AND SATURDAY AND 1 TABLET ON ALL OTHER DAYS, Disp: 115 tablet, Rfl: 0 Past Medical History:  Diagnosis Date  . Chronic kidney disease   . History of chicken pox   . History of DVT of lower extremity    right  . HIV (human immunodeficiency virus  infection) ([place])   . Hyperlipidemia   . Left hip pain 03/09/2013  . Pneumonia   . Protein malnutrition ([place]) 08/2015   Social History   Socioeconomic History  . Marital status: Widowed    Spouse name: Not on file  . Number of children: 3  . Years of education: Not on file  . Highest education level: Not on file  Occupational History    Employer: UNEMPLOYED  Social Needs  . Financial resource strain: Not on file  . Food insecurity    Worry: Not on file    Inability: Not on file  . Transportation needs    Medical: Not on file    Non-medical: Not on file  Tobacco Use  . Smoking status: Current Every Day Smoker    Packs/day: 0.50    Years: 36.00    Pack years: 18.00    Types: Cigarettes  . Smokeless tobacco: Never Used  . Tobacco comment: ABOUT 1/2PPD.  Slowing down  Substance and Sexual Activity  . Alcohol use: No    Alcohol/week: 0.0 standard drinks  . Drug use: No  . Sexual activity: Not Currently  Lifestyle  . Physical activity    Days per week: Not on file    Minutes per session: Not on file  . Stress: Not on file  Relationships  . Social Herbalist on phone: Not on file    Gets together: Not on file    Attends religious service: Not on file    Active member of club or organization: Not on file    Attends meetings of clubs or organizations: Not on file    Relationship status: Not on file  Other Topics Concern  . Not on file  Social History Narrative  . Not on file   Family History  Problem Relation Age of Onset  . Arthritis Mother   . Kidney disease Maternal Grandfather     ASSESSMENT Recent Results: The most recent result is correlated with 45 mg per week: Lab Results  Component Value Date   INR 2.3 07/22/2019   INR 2.2 06/09/2019   INR 2.4 04/28/2019   PROTIME 38.4 (H) 04/01/2012    Anticoagulation Dosing: Description   Take 1 & 1/2 tablets of your peach-colored 5mg  strength warfarin tablets Sundays, Tuesdays, Thursdays and  Saturdays; on Mondays, Wednesdays and Fridays,  ONLY 1 tablet.      INR today: Therapeutic  PLAN Weekly dose was unchanged. Patient remains on 45mg  warfarin/wk.    Patient Instructions  Patient instructed to take medications as defined in the Anti-coagulation Track section of this encounter.  Patient instructed to take today's dose. Patient instructed to take  1 & 1/2 tablets of your peach-colored 5mg  strength warfarin tablets  Sundays, Tuesdays, Thursdays and Saturdays; on Mondays, Wednesdays and Fridays,  ONLY 1 tablet.  Patient verbalized understanding of these instructions.     Patient advised to contact clinic or seek medical attention if signs/symptoms of bleeding or thromboembolism occur.  Patient verbalized understanding by repeating back information and was advised to contact me if further medication-related questions arise. Patient was also provided an information handout.  Follow-up Return in 6 weeks (on 09/01/2019) for Follow up INR.  [name], PharmD, CPP  15 minutes spent face-to-face with the patient during the encounter. 50% of time spent on education, including signs/sx bleeding and clotting, as well as food and drug interactions with warfarin. 50% of time was spent on fingerprick POC INR sample collection,processing, results determination, and documentation in [email or web address].kim.net/.

## 2019-07-24 NOTE — Progress Notes (Signed)
INTERNAL MEDICINE TEACHING ATTENDING ADDENDUM   I agree with pharmacy recommendations as outlined in their note.   Lyberti Thrush, MD  

## 2019-07-29 ENCOUNTER — Encounter: Payer: Medicare Other | Admitting: Internal Medicine

## 2019-07-29 ENCOUNTER — Telehealth: Payer: Self-pay | Admitting: *Deleted

## 2019-07-29 ENCOUNTER — Other Ambulatory Visit: Payer: Self-pay | Admitting: Internal Medicine

## 2019-07-29 DIAGNOSIS — G894 Chronic pain syndrome: Secondary | ICD-10-CM

## 2019-07-29 MED ORDER — OXYCODONE HCL 5 MG PO TABS: 5.0000 mg | ORAL_TABLET | Freq: Two times a day (BID) | ORAL | 0 refills | Status: DC | PRN

## 2019-07-29 MED ORDER — MORPHINE SULFATE ER 30 MG PO TBCR: 90.0000 mg | EXTENDED_RELEASE_TABLET | Freq: Three times a day (TID) | ORAL | 0 refills | Status: DC

## 2019-07-29 NOTE — Telephone Encounter (Signed)
Patient called to report that it is time for his refill of medication. Advised patient will let provider know and someone will call him once it has been sent to pharmacy.

## 2019-07-29 NOTE — Telephone Encounter (Signed)
Refills sent

## 2019-08-26 ENCOUNTER — Other Ambulatory Visit: Payer: Self-pay | Admitting: Internal Medicine

## 2019-08-26 DIAGNOSIS — B20 Human immunodeficiency virus [HIV] disease: Secondary | ICD-10-CM

## 2019-08-27 ENCOUNTER — Other Ambulatory Visit: Payer: Self-pay

## 2019-08-27 DIAGNOSIS — B20 Human immunodeficiency virus [HIV] disease: Secondary | ICD-10-CM

## 2019-08-27 MED ORDER — PREZCOBIX 800-150 MG PO TABS: ORAL_TABLET | ORAL | 5 refills | Status: DC

## 2019-08-27 MED ORDER — TIVICAY 50 MG PO TABS: 50.0000 mg | ORAL_TABLET | Freq: Every day | ORAL | 5 refills | Status: DC

## 2019-08-27 MED ORDER — VEMLIDY 25 MG PO TABS: 1.0000 | ORAL_TABLET | Freq: Every day | ORAL | 5 refills | Status: DC

## 2019-08-29 ENCOUNTER — Other Ambulatory Visit: Payer: Self-pay | Admitting: Internal Medicine

## 2019-08-29 ENCOUNTER — Telehealth: Payer: Self-pay | Admitting: *Deleted

## 2019-08-29 DIAGNOSIS — G894 Chronic pain syndrome: Secondary | ICD-10-CM

## 2019-08-29 MED ORDER — MORPHINE SULFATE ER 30 MG PO TBCR: 90.0000 mg | EXTENDED_RELEASE_TABLET | Freq: Three times a day (TID) | ORAL | 0 refills | Status: DC

## 2019-08-29 MED ORDER — OXYCODONE HCL 5 MG PO TABS: 5.0000 mg | ORAL_TABLET | Freq: Two times a day (BID) | ORAL | 0 refills | Status: DC | PRN

## 2019-08-29 NOTE — Telephone Encounter (Signed)
Thanks! Patient notified.

## 2019-08-29 NOTE — Telephone Encounter (Signed)
[name] is calling for his monthly pain prescription refill (MS contin, oxycodone) to be sent to [facility]. Please advise.  [name], RN

## 2019-08-29 NOTE — Telephone Encounter (Signed)
Done

## 2019-09-01 ENCOUNTER — Ambulatory Visit (INDEPENDENT_AMBULATORY_CARE_PROVIDER_SITE_OTHER): Payer: Medicare Other | Admitting: Pharmacist

## 2019-09-01 ENCOUNTER — Other Ambulatory Visit: Payer: Self-pay

## 2019-09-01 DIAGNOSIS — Z5181 Encounter for therapeutic drug level monitoring: Secondary | ICD-10-CM

## 2019-09-01 DIAGNOSIS — Z7901 Long term (current) use of anticoagulants: Secondary | ICD-10-CM | POA: Diagnosis not present

## 2019-09-01 DIAGNOSIS — Z86718 Personal history of other venous thrombosis and embolism: Secondary | ICD-10-CM

## 2019-09-01 LAB — POCT INR: INR: 2.3 (ref 2.0–3.0)

## 2019-09-01 MED ORDER — WARFARIN SODIUM 5 MG PO TABS: ORAL_TABLET | ORAL | 1 refills | Status: DC

## 2019-09-01 NOTE — Patient Instructions (Signed)
Patient instructed to take medications as defined in the Anti-coagulation Track section of this encounter.  Patient instructed to take today's dose.  Patient instructed to take 1 & 1/2 tablets of your peach-colored 5mg strength warfarin tablets Sundays, Tuesdays, Thursdays and Saturdays; on Mondays, Wednesdays and Fridays,  ONLY 1 tablet.  Patient verbalized understanding of these instructions.   

## 2019-09-01 NOTE — Progress Notes (Signed)
Anticoagulation Management [name] is a 58 y.o. male who reports to the clinic for monitoring of warfarin treatment.    Indication: DVT, History of (resolved); Long term current use of anticoagulant.  Duration: indefinite Supervising physician: [facility] Clinic Visit History: Patient not report signs/symptoms of bleeding or thromboembolism  Other recent changes: No diet, medications, lifestyle changes.  Anticoagulation Episode Summary    Current INR goal:  2.0-3.0  TTR:  72.4 % (8.6 y)  Next INR check:  10/13/2019  INR from last check:  2.3 (09/01/2019)  Weekly max warfarin dose:    Target end date:  Indefinite  INR check location:  Anticoagulation Clinic  Preferred lab:    Send INR reminders to:  [facility]   Indications   History of DVT (deep vein thrombosis) [Z86.718] Long term current use of anticoagulant [Z79.01]       Comments:        Anticoagulation Care Providers    Provider Role Specialty Phone number   Michel Bickers, MD  Infectious Diseases [phone]      Allergies  Allergen Reactions  . Dapsone     REACTION: fever  . Megestrol   . Sulfamethoxazole-Trimethoprim     REACTION: fever    Current Outpatient Medications:  .  acetaminophen (TYLENOL) 500 MG tablet, Take 1 tablet (500 mg total) by mouth every 4 (four) hours as needed for mild pain, moderate pain or headache., Disp: 30 tablet, Rfl: 0 .  darunavir-cobicistat ([name]) 800-150 MG tablet, TAKE 1 TABLET BY MOUTH EVERY DAY DO NOT CRUSH BREAK OR CHEW TABLETS TAKE WITH FOOD, Disp: 30 tablet, Rfl: 5 .  dolutegravir ([name]) 50 MG tablet, Take 1 tablet (50 mg total) by mouth daily., Disp: 30 tablet, Rfl: 5 .  ENSURE (ENSURE), Take 1 Can by mouth 4 (four) times daily - after meals and at bedtime., Disp: 237 mL, Rfl: 11 .  erythromycin ophthalmic ointment, Place a 1/2 inch ribbon of ointment into the lower eyelid., Disp: 3.5 g, Rfl: 0 .  gabapentin (NEURONTIN) 400 MG  capsule, TAKE 1 CAPSULE(400 MG) BY MOUTH FOUR TIMES DAILY, Disp: 360 capsule, Rfl: 1 .  magnesium chloride (SLOW-MAG) 64 MG TBEC SR tablet, Take 1 tablet (64 mg total) by mouth daily., Disp: 30 tablet, Rfl: 1 .  morphine (MS CONTIN) 30 MG 12 hr tablet, Take 3 tablets (90 mg total) by mouth 3 (three) times daily., Disp: 270 tablet, Rfl: 0 .  Multiple Vitamin (MULTIVITAMIN WITH MINERALS) TABS tablet, Take 1 tablet by mouth daily., Disp: 30 tablet, Rfl: 1 .  olopatadine (PATANOL) 0.1 % ophthalmic solution, Place 1 drop into both eyes 2 (two) times daily., Disp: 5 mL, Rfl: 12 .  oxyCODONE (OXY IR/ROXICODONE) 5 MG immediate release tablet, Take 1 tablet (5 mg total) by mouth 2 (two) times daily as needed for severe pain., Disp: 60 tablet, Rfl: 0 .  pantoprazole (PROTONIX) 40 MG tablet, Take 1 tablet (40 mg total) by mouth daily., Disp: 90 tablet, Rfl: 3 .  phosphorus (K PHOS NEUTRAL) 155-852-130 MG tablet, Take 3 tablets (750 mg total) by mouth 2 (two) times daily., Disp: 180 tablet, Rfl: 0 .  potassium chloride SA (K-DUR,KLOR-CON) 20 MEQ tablet, Take 1 tablet (20 mEq total) by mouth daily., Disp: 90 tablet, Rfl: 0 .  potassium citrate (UROCIT-K) 10 MEQ (1080 MG) SR tablet, Take 4 tablets (40 mEq total) by mouth 2 (two) times daily with a meal., Disp: 240 tablet, Rfl: 0 .  SANTYL ointment,  APPLY TOPICALLY TO THE AFFECTED AREA(S) DAILY, Disp: 30 g, Rfl: 0 .  sodium bicarbonate 650 MG tablet, Take 2 tablets (1,300 mg total) by mouth 3 (three) times daily., Disp: 180 tablet, Rfl: 0 .  Tenofovir Alafenamide Fumarate (VEMLIDY) 25 MG TABS, Take 1 tablet (25 mg total) by mouth daily., Disp: 30 tablet, Rfl: 5 .  warfarin (COUMADIN) 5 MG tablet, TAKE 1 AND 1/2 TABLETS BY MOUTH ON SUNDAY, TUESDAY, THURSDAY AND SATURDAY AND 1 TABLET ON ALL OTHER DAYS, Disp: 115 tablet, Rfl: 1 Past Medical History:  Diagnosis Date  . Chronic kidney disease   . History of chicken pox   . History of DVT of lower extremity    right   . HIV (human immunodeficiency virus infection) ([place])   . Hyperlipidemia   . Left hip pain 03/09/2013  . Pneumonia   . Protein malnutrition ([place]) 08/2015   Social History   Socioeconomic History  . Marital status: Widowed    Spouse name: Not on file  . Number of children: 3  . Years of education: Not on file  . Highest education level: Not on file  Occupational History    Employer: UNEMPLOYED  Social Needs  . Financial resource strain: Not on file  . Food insecurity    Worry: Not on file    Inability: Not on file  . Transportation needs    Medical: Not on file    Non-medical: Not on file  Tobacco Use  . Smoking status: Current Every Day Smoker    Packs/day: 0.50    Years: 36.00    Pack years: 18.00    Types: Cigarettes  . Smokeless tobacco: Never Used  . Tobacco comment: ABOUT 1/2PPD.  Slowing down  Substance and Sexual Activity  . Alcohol use: No    Alcohol/week: 0.0 standard drinks  . Drug use: No  . Sexual activity: Not Currently  Lifestyle  . Physical activity    Days per week: Not on file    Minutes per session: Not on file  . Stress: Not on file  Relationships  . Social Herbalist on phone: Not on file    Gets together: Not on file    Attends religious service: Not on file    Active member of club or organization: Not on file    Attends meetings of clubs or organizations: Not on file    Relationship status: Not on file  Other Topics Concern  . Not on file  Social History Narrative  . Not on file   Family History  Problem Relation Age of Onset  . Arthritis Mother   . Kidney disease Maternal Grandfather     ASSESSMENT Recent Results: The most recent result is correlated with 45 mg per week: Lab Results  Component Value Date   INR 2.3 09/01/2019   INR 2.3 07/22/2019   INR 2.2 06/09/2019   PROTIME 38.4 (H) 04/01/2012    Anticoagulation Dosing: Description   Take 1 & 1/2 tablets of your peach-colored 5mg  strength warfarin tablets  Sundays, Tuesdays, Thursdays and Saturdays; on Mondays, Wednesdays and Fridays,  ONLY 1 tablet.      INR today: Therapeutic  PLAN Weekly dose was unchanged.   Patient Instructions  Patient instructed to take medications as defined in the Anti-coagulation Track section of this encounter.  Patient instructed to take today's dose.  Patient instructed to take  1 & 1/2 tablets of your peach-colored 5mg  strength warfarin tablets Sundays, Tuesdays, Thursdays and  Saturdays; on Mondays, Wednesdays and Fridays,  ONLY 1 tablet. Patient verbalized understanding of these instructions.    Patient advised to contact clinic or seek medical attention if signs/symptoms of bleeding or thromboembolism occur.  Patient verbalized understanding by repeating back information and was advised to contact me if further medication-related questions arise. Patient was also provided an information handout.  Follow-up Return in 6 weeks (on 10/13/2019) for Follow up INR.  [name], PharmD, CPP  15 minutes spent face-to-face with the patient during the encounter. 50% of time spent on education, including signs/sx bleeding and clotting, as well as food and drug interactions with warfarin. 50% of time was spent on fingerprick POC INR sample collection,processing, results determination, and documentation in http://www.kim.net/.

## 2019-09-02 NOTE — Progress Notes (Signed)
Internal Medicine Clinic Attending  I saw and evaluated the patient.  I personally confirmed the key portions of the history and exam documented by Dr. Winters and I reviewed pertinent patient test results.  The assessment, diagnosis, and plan were formulated together and I agree with the documentation in the resident's note.  

## 2019-09-18 ENCOUNTER — Ambulatory Visit (INDEPENDENT_AMBULATORY_CARE_PROVIDER_SITE_OTHER): Payer: Medicare Other | Admitting: Internal Medicine

## 2019-09-18 ENCOUNTER — Encounter: Payer: Self-pay | Admitting: Internal Medicine

## 2019-09-18 ENCOUNTER — Other Ambulatory Visit: Payer: Self-pay

## 2019-09-18 VITALS — BP 121/69 | HR 110 | Temp 98.7°F | Ht 71.0 in | Wt 126.9 lb

## 2019-09-18 DIAGNOSIS — Z23 Encounter for immunization: Secondary | ICD-10-CM | POA: Diagnosis not present

## 2019-09-18 DIAGNOSIS — Z993 Dependence on wheelchair: Secondary | ICD-10-CM

## 2019-09-18 DIAGNOSIS — Z0000 Encounter for general adult medical examination without abnormal findings: Secondary | ICD-10-CM

## 2019-09-18 DIAGNOSIS — F17210 Nicotine dependence, cigarettes, uncomplicated: Secondary | ICD-10-CM | POA: Diagnosis not present

## 2019-09-18 NOTE — Progress Notes (Signed)
   CC: Preventative Healthcare Maintenance    HPI:  [name] is a 58 y.o., with a PMH noted below, who presents to the clinic for preventative healthcare and to discuss the possibility of replacing his powered wheelchair. To see the management of his acute and chronic conditions, please see the A&P note under the encounters tab.    Past Medical History:  Diagnosis Date  . Chronic kidney disease   . History of chicken pox   . History of DVT of lower extremity    right  . HIV (human immunodeficiency virus infection) ([place])   . Hyperlipidemia   . Left hip pain 03/09/2013  . Pneumonia   . Protein malnutrition ([place]) 08/2015   Review of Systems:   Review of Systems  Constitutional: Negative for chills, fever and malaise/fatigue.  HENT: Negative for hearing loss.   Eyes: Negative for blurred vision and double vision.  Respiratory: Negative for cough, hemoptysis, shortness of breath and wheezing.   Cardiovascular: Negative for chest pain, palpitations and orthopnea.  Gastrointestinal: Negative for abdominal pain, blood in stool, constipation, diarrhea, nausea and vomiting.  Genitourinary: Negative for dysuria, frequency, hematuria and urgency.  Neurological: Negative for dizziness, tingling and headaches.  All other systems reviewed and are negative.   Physical Exam:  Vitals:   09/18/19 0856  BP: 121/69  Pulse: (!) 110  Temp: 98.7 F (37.1 C)  TempSrc: Oral  SpO2: 93%  Weight: 126 lb 14.4 oz (57.6 kg)  Height: 5\' 11"  (1.803 m)   Physical Exam Constitutional:      General: He is not in acute distress.    Appearance: Normal appearance. He is not ill-appearing or toxic-appearing.  HENT:     Head: Normocephalic and atraumatic.  Cardiovascular:     Rate and Rhythm: Normal rate and regular rhythm.     Pulses: Normal pulses.     Heart sounds: Normal heart sounds. No murmur. No friction rub. No gallop.   Pulmonary:     Effort: Pulmonary effort is normal.     Breath  sounds: Normal breath sounds. No wheezing, rhonchi or rales.  Abdominal:     General: Abdomen is flat. Bowel sounds are normal.     Palpations: Abdomen is soft.     Tenderness: There is no abdominal tenderness. There is no guarding.  Neurological:     Mental Status: He is alert and oriented to person, place, and time.  Psychiatric:        Mood and Affect: Mood normal.        Behavior: Behavior normal.        Thought Content: Thought content normal.     Assessment & Plan:   See Encounters Tab for problem based charting.  Patient seen with Dr. Dareen Piano

## 2019-09-18 NOTE — Patient Instructions (Signed)
To [name],  It was a pleasure seeing you today. Today we spoke about working towards getting your wheelchair replaced as you are due for a replacement. We will start coordinating to work towards a new wheelchair. Today we also gave you an updated flu shot. We look forward to working with you again, and will reach out to you when we have a date. Have a Happy Thanksgiving and Happy Holidays.  Sincerely,  [name], MD

## 2019-09-19 DIAGNOSIS — Z993 Dependence on wheelchair: Secondary | ICD-10-CM | POA: Insufficient documentation

## 2019-09-22 ENCOUNTER — Encounter: Payer: Self-pay | Admitting: Internal Medicine

## 2019-09-22 ENCOUNTER — Other Ambulatory Visit: Payer: Self-pay

## 2019-09-22 ENCOUNTER — Ambulatory Visit (INDEPENDENT_AMBULATORY_CARE_PROVIDER_SITE_OTHER): Payer: Medicare Other | Admitting: Internal Medicine

## 2019-09-22 DIAGNOSIS — Z993 Dependence on wheelchair: Secondary | ICD-10-CM | POA: Diagnosis not present

## 2019-09-22 NOTE — Progress Notes (Signed)
Internal Medicine Clinic Attending  Case discussed with Dr. Winters at the time of the visit.  We reviewed the resident's history and exam and pertinent patient test results.  I agree with the assessment, diagnosis, and plan of care documented in the resident's note.  

## 2019-09-22 NOTE — Assessment & Plan Note (Signed)
Patient comes to the clinic today for follow up on his preventative healthcare management. Patient states that he did fill out his sample for his fecal sample. Patient also stated that he would get a flu vaccine in February. We discussed flu season and the positive effects of the vaccine in prevention of the flu during the [place] pandemic. Patient agreed to flu vaccine during this visit.   Plan:  - Patient to receive flu vaccine today.

## 2019-09-22 NOTE — Patient Instructions (Addendum)
[name],   It was a pleasure seeing you today! Today we did an assessment for a replacement for your power chair. We did a complete physical examination and interview concerning your use of the power chair. We will file the paperwork and continue working toward replacing your chair. Have a Happy Thanksgiving and an Happy Holidays.  Sincerely,  [name], MD.

## 2019-09-22 NOTE — Progress Notes (Signed)
   CC: Mobility Assessment for Power Wheelchair  HPI:  [name] is a 58 y.o., with a PMH noted below, who presents to the off for a mobility assessment for his power wheelchair. To see the management of his acute and chronic conditions, please see the A&P note under the encounters tab.    Past Medical History:  Diagnosis Date  . Chronic kidney disease   . History of chicken pox   . History of DVT of lower extremity    right  . HIV (human immunodeficiency virus infection) ([place])   . Hyperlipidemia   . Left hip pain 03/09/2013  . Pneumonia   . Protein malnutrition ([place]) 08/2015   Review of Systems:   Review of Systems  Constitutional: Negative for chills, diaphoresis, fever, malaise/fatigue and weight loss.  HENT: Negative for ear discharge, ear pain, nosebleeds and sinus pain.   Eyes: Negative for blurred vision, double vision and photophobia.  Respiratory: Negative for shortness of breath and wheezing.   Cardiovascular: Negative for chest pain, palpitations and orthopnea.  Gastrointestinal: Negative for abdominal pain, constipation, diarrhea, nausea and vomiting.  Musculoskeletal: Negative for myalgias.  Neurological: Negative for dizziness and headaches.    Physical Exam:  Vitals:   09/22/19 0842  BP: 122/67  Pulse: (!) 108  Temp: 99.1 F (37.3 C)  TempSrc: Oral  SpO2: 93%  Weight: 128 lb 8 oz (58.3 kg)  Height: 5\' 11"  (1.803 m)   Physical Exam Vitals signs and nursing note reviewed.  Constitutional:      General: He is not in acute distress.    Appearance: Normal appearance. He is not ill-appearing or toxic-appearing.     Comments: Patient sitting comfortably in power chair. Does not appear to be in acute distress.   HENT:     Head: Normocephalic and atraumatic.     Right Ear: External ear normal.     Left Ear: External ear normal.     Nose: Nose normal.  Cardiovascular:     Rate and Rhythm: Normal rate and regular rhythm.     Pulses: Normal pulses.    Heart sounds: Normal heart sounds. No murmur. No friction rub. No gallop.   Pulmonary:     Effort: Pulmonary effort is normal.     Breath sounds: Normal breath sounds. No wheezing, rhonchi or rales.  Abdominal:     General: Abdomen is flat. Bowel sounds are normal.     Palpations: Abdomen is soft.     Tenderness: There is no abdominal tenderness. There is no guarding.  Musculoskeletal:     Comments: Patient has full range of motion of his RUE. Limited ROM of the LUE with pain elicited past [id number] degrees.  Grip strength 5/5 bilaterally in the upper extremities.  5/5 bilaterally in the upper extremities.  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  4/5 strength in the LLE with muscle atrophy present.  Neurological:     Mental Status: He is alert.     Assessment & Plan:   See Encounters Tab for problem based charting.  Patient seen with Dr. Rebeca Alert

## 2019-09-22 NOTE — Assessment & Plan Note (Signed)
Patient also comes to the office today in need of a replacement of his power chair. Patient states that it has been five years, and that his wheelchair is starting to have trouble starting. Due to time constraints of this visit, patient will be rescheduled for a reassessment of a replacement power chair.   Plan:   - Reschedule Patient for assessment for power chair.

## 2019-09-23 ENCOUNTER — Encounter: Payer: Self-pay | Admitting: Internal Medicine

## 2019-09-23 NOTE — Assessment & Plan Note (Addendum)
HPI:  [name] arrives to the office today for a mobility reassessment. Patient states that it has been 5 years since he received his power chair.   He states that his power chair has been immensely helpful in completing his daily activities, such as cooking and cleaning his house. He states that he has moved to a new residence 6 months ago, and that his bathroom may not fit the chair, but he is able to safely leave his chair by transitioning to another chair, before transitioning to the commode.   Patient has tried to ambulate with a cane, walker, and wheelchair, but has been unsuccessful in his attempts. While using a cane, his ULE was aggravated by the weight to use to hold himself up, which led to several falls and unsteady gate. Similarly, supporting himself via walker resulted in severe ULE pain. Patient states that these modalities aggravated his ULE and would increase the pain from a 5/10 to 10/10 with continual use throughout the day. Not only do these modalities elicit pain, but patient is physically deconditioned by either his complex medical history or dependence on his power chair, as seen by both his muscular atrophy and his O2 saturation of 80% on exertion, before normalizing to 93%. Lastly, due to his limited range of motion and pain in the ULE, the patient is unable to manually maneuver in a standard wheelchair.   While standard modalities have not been effective for [name] mobility, a power scooter is also an inadequate form of mobilization for [name]. While his grip strength in the upper extremities is 5/5, his limited rang of motion would hinder the usage of the steering wheel on a power scooter. Additionally, the seat is inadequate for holding [name]. [name] uses straps to keep himself anchored to his power wheel chair, and uses the wide base and back with extra padding for further stability and to help prevent erosion of his multiple sacral lesions, for which he  follows with wound care routinely.  Ultimately, the culmination of [name] past and present medical history, his deconditioning, and limited range of movement and pain hinder him from using standard modalities for ambulation. He utilizes the power chair by using the unaffected limb and utilizes the seating and cushioning to prevent deterioration of his wounds and affords the stability a power scooter cannot provide. He uses his power chair daily and has been reliant on it for the past five years, and has effectively become an extension of his body. He is able to complete his basic needs with minimal pain and discomfort, and knows the limitations and benefits that his power wheelchair provides. Due to the culmination of these factors it is recommended that [name] continue to utilize a power wheelchair to complete his daily activities. I am referring patient for a PT eval for Power Wheelchair.

## 2019-09-29 ENCOUNTER — Telehealth: Payer: Self-pay | Admitting: *Deleted

## 2019-09-29 ENCOUNTER — Other Ambulatory Visit: Payer: Self-pay | Admitting: Internal Medicine

## 2019-09-29 DIAGNOSIS — G894 Chronic pain syndrome: Secondary | ICD-10-CM

## 2019-09-29 MED ORDER — MORPHINE SULFATE ER 30 MG PO TBCR: 90.0000 mg | EXTENDED_RELEASE_TABLET | Freq: Three times a day (TID) | ORAL | 0 refills | Status: DC

## 2019-09-29 MED ORDER — OXYCODONE HCL 5 MG PO TABS: 5.0000 mg | ORAL_TABLET | Freq: Two times a day (BID) | ORAL | 0 refills | Status: DC | PRN

## 2019-09-29 NOTE — Telephone Encounter (Signed)
Jacob Moores, Orvis Brill, RN        Thank you. Got it! I will let you know if there needs to be any further info in the note.   Previous Messages  ----- Message -----  From: Velora Heckler, RN  Sent: 09/29/2019 11:02 AM EST  To: Shela Nevin, Judge Stall, NT  Subject: Power w/c                     Good morning, Debbie. Hope you had a nice holiday weekend!   There is an order in for power w/c. F2F was 09/22/2019.   Thanks!   Ander Purpura

## 2019-09-30 NOTE — Progress Notes (Signed)
Internal Medicine Clinic Attending  I saw and evaluated the patient.  I personally confirmed the key portions of the history and exam documented by Dr. Gilford Rile and I reviewed pertinent patient test results.  The assessment, diagnosis, and plan were formulated together and I agree with the documentation in the resident's note.  Lenice Pressman, M.D., Ph.D.

## 2019-10-01 NOTE — Telephone Encounter (Signed)
Albertina Parr, Rejeana Brock, Orvis Brill, RN        Hi Lauren -   I have just been notified that patient received his last power wheelchair in early 2017 and will be eligible for another power wheelchair in early 2022.   I apologize for requesting MD to add the statement to the note before realizing patient is not eligible for a new power wheelchair yet.

## 2019-10-13 ENCOUNTER — Ambulatory Visit: Payer: Medicare Other

## 2019-10-20 ENCOUNTER — Ambulatory Visit (INDEPENDENT_AMBULATORY_CARE_PROVIDER_SITE_OTHER): Payer: Medicare Other

## 2019-10-20 DIAGNOSIS — Z5181 Encounter for therapeutic drug level monitoring: Secondary | ICD-10-CM | POA: Diagnosis not present

## 2019-10-20 DIAGNOSIS — Z7901 Long term (current) use of anticoagulants: Secondary | ICD-10-CM

## 2019-10-20 DIAGNOSIS — Z86718 Personal history of other venous thrombosis and embolism: Secondary | ICD-10-CM | POA: Diagnosis not present

## 2019-10-20 LAB — POCT INR: INR: 2.5 (ref 2.0–3.0)

## 2019-10-20 NOTE — Patient Instructions (Signed)
Patient instructed to take medications as defined in the Anti-coagulation Track section of this encounter.  Patient instructed to take today's dose.  Patient instructed to take 1 & 1/2 tablets of your peach-colored 5mg strength warfarin tablets Sundays, Tuesdays, Thursdays and Saturdays; on Mondays, Wednesdays and Fridays,  ONLY 1 tablet.  Patient verbalized understanding of these instructions.   

## 2019-10-20 NOTE — Progress Notes (Signed)
Anticoagulation Management [name] is a 58 y.o. male who reports to the clinic for monitoring of warfarin treatment.    Indication: History of DVT and long term use of anticoagulation.  Duration: indefinite Supervising physician: Joni Reining  Anticoagulation Clinic Visit History: Patient does not report signs/symptoms of bleeding or thromboembolism  Other recent changes: No diet, medications, lifestyle changes Anticoagulation Episode Summary    Current INR goal:  2.0-3.0  TTR:  72.9 % (8.7 y)  Next INR check:  12/01/2019  INR from last check:  2.5 (10/20/2019)  Weekly max warfarin dose:    Target end date:  Indefinite  INR check location:  Anticoagulation Clinic  Preferred lab:    Send INR reminders to:  [facility]   Indications   History of DVT (deep vein thrombosis) [Z86.718] Long term current use of anticoagulant [Z79.01]       Comments:        Anticoagulation Care Providers    Provider Role Specialty Phone number   Michel Bickers, MD  Infectious Diseases [phone]      Allergies  Allergen Reactions  . Dapsone     REACTION: fever  . Megestrol   . Sulfamethoxazole-Trimethoprim     REACTION: fever    Current Outpatient Medications:  .  acetaminophen (TYLENOL) 500 MG tablet, Take 1 tablet (500 mg total) by mouth every 4 (four) hours as needed for mild pain, moderate pain or headache., Disp: 30 tablet, Rfl: 0 .  darunavir-cobicistat ([name]) 800-150 MG tablet, TAKE 1 TABLET BY MOUTH EVERY DAY DO NOT CRUSH BREAK OR CHEW TABLETS TAKE WITH FOOD, Disp: 30 tablet, Rfl: 5 .  dolutegravir ([name]) 50 MG tablet, Take 1 tablet (50 mg total) by mouth daily., Disp: 30 tablet, Rfl: 5 .  ENSURE (ENSURE), Take 1 Can by mouth 4 (four) times daily - after meals and at bedtime., Disp: 237 mL, Rfl: 11 .  erythromycin ophthalmic ointment, Place a 1/2 inch ribbon of ointment into the lower eyelid., Disp: 3.5 g, Rfl: 0 .  gabapentin (NEURONTIN) 400 MG capsule, TAKE 1  CAPSULE(400 MG) BY MOUTH FOUR TIMES DAILY, Disp: 360 capsule, Rfl: 1 .  magnesium chloride (SLOW-MAG) 64 MG TBEC SR tablet, Take 1 tablet (64 mg total) by mouth daily., Disp: 30 tablet, Rfl: 1 .  morphine (MS CONTIN) 30 MG 12 hr tablet, Take 3 tablets (90 mg total) by mouth 3 (three) times daily., Disp: 270 tablet, Rfl: 0 .  Multiple Vitamin (MULTIVITAMIN WITH MINERALS) TABS tablet, Take 1 tablet by mouth daily., Disp: 30 tablet, Rfl: 1 .  olopatadine (PATANOL) 0.1 % ophthalmic solution, Place 1 drop into both eyes 2 (two) times daily., Disp: 5 mL, Rfl: 12 .  oxyCODONE (OXY IR/ROXICODONE) 5 MG immediate release tablet, Take 1 tablet (5 mg total) by mouth 2 (two) times daily as needed for severe pain., Disp: 60 tablet, Rfl: 0 .  pantoprazole (PROTONIX) 40 MG tablet, Take 1 tablet (40 mg total) by mouth daily., Disp: 90 tablet, Rfl: 3 .  phosphorus (K PHOS NEUTRAL) 155-852-130 MG tablet, Take 3 tablets (750 mg total) by mouth 2 (two) times daily., Disp: 180 tablet, Rfl: 0 .  potassium chloride SA (K-DUR,KLOR-CON) 20 MEQ tablet, Take 1 tablet (20 mEq total) by mouth daily., Disp: 90 tablet, Rfl: 0 .  potassium citrate (UROCIT-K) 10 MEQ (1080 MG) SR tablet, Take 4 tablets (40 mEq total) by mouth 2 (two) times daily with a meal., Disp: 240 tablet, Rfl: 0 .  SANTYL ointment, APPLY  TOPICALLY TO THE AFFECTED AREA(S) DAILY, Disp: 30 g, Rfl: 0 .  sodium bicarbonate 650 MG tablet, Take 2 tablets (1,300 mg total) by mouth 3 (three) times daily., Disp: 180 tablet, Rfl: 0 .  Tenofovir Alafenamide Fumarate (VEMLIDY) 25 MG TABS, Take 1 tablet (25 mg total) by mouth daily., Disp: 30 tablet, Rfl: 5 .  warfarin (COUMADIN) 5 MG tablet, TAKE 1 AND 1/2 TABLETS BY MOUTH ON SUNDAY, TUESDAY, THURSDAY AND SATURDAY AND 1 TABLET ON ALL OTHER DAYS, Disp: 115 tablet, Rfl: 1 Past Medical History:  Diagnosis Date  . Chronic kidney disease   . History of chicken pox   . History of DVT of lower extremity    right  . HIV (human  immunodeficiency virus infection) ([place])   . Hyperlipidemia   . Left hip pain 03/09/2013  . Pneumonia   . Protein malnutrition ([place]) 08/2015   Social History   Socioeconomic History  . Marital status: Widowed    Spouse name: Not on file  . Number of children: 3  . Years of education: Not on file  . Highest education level: Not on file  Occupational History    Employer: UNEMPLOYED  Tobacco Use  . Smoking status: Current Every Day Smoker    Packs/day: 1.40    Years: 36.00    Pack years: 50.40    Types: Cigarettes  . Smokeless tobacco: Never Used  . Tobacco comment: 1 pk a day   Substance and Sexual Activity  . Alcohol use: No    Alcohol/week: 0.0 standard drinks  . Drug use: No  . Sexual activity: Not Currently  Other Topics Concern  . Not on file  Social History Narrative  . Not on file   Social Determinants of Health   Financial Resource Strain:   . Difficulty of Paying Living Expenses: Not on file  Food Insecurity:   . Worried About Charity fundraiser in the Last Year: Not on file  . Ran Out of Food in the Last Year: Not on file  Transportation Needs:   . Lack of Transportation (Medical): Not on file  . Lack of Transportation (Non-Medical): Not on file  Physical Activity:   . Days of Exercise per Week: Not on file  . Minutes of Exercise per Session: Not on file  Stress:   . Feeling of Stress : Not on file  Social Connections:   . Frequency of Communication with Friends and Family: Not on file  . Frequency of Social Gatherings with Friends and Family: Not on file  . Attends Religious Services: Not on file  . Active Member of Clubs or Organizations: Not on file  . Attends Archivist Meetings: Not on file  . Marital Status: Not on file   Family History  Problem Relation Age of Onset  . Arthritis Mother   . Kidney disease Maternal Grandfather     ASSESSMENT Recent Results: The most recent result is correlated with 45 mg per week: Lab Results   Component Value Date   INR 2.5 10/20/2019   INR 2.3 09/01/2019   INR 2.3 07/22/2019   PROTIME 38.4 (H) 04/01/2012    Anticoagulation Dosing: Description   Take 1 & 1/2 tablets of your peach-colored 5mg  strength warfarin tablets Sundays, Tuesdays, Thursdays and Saturdays; on Mondays, Wednesdays and Fridays,  ONLY 1 tablet.      INR today: Therapeutic  PLAN Weekly dose was unchanged.  Patient Instructions  Patient instructed to take medications as defined in the  Anti-coagulation Track section of this encounter.  Patient instructed to take today's dose.  Patient instructed to take 1 & 1/2 tablets of your peach-colored 5mg  strength warfarin tablets Sundays, Tuesdays, Thursdays and Saturdays; on Mondays, Wednesdays and Fridays,  ONLY 1 tablet.  Patient verbalized understanding of these instructions.    Patient advised to contact clinic or seek medical attention if signs/symptoms of bleeding or thromboembolism occur.  Patient verbalized understanding by repeating back information and was advised to contact me if further medication-related questions arise. Patient was also provided an information handout.  Follow-up Return in about 6 weeks (around 12/01/2019) for INR check on 12/01/2019 at 10 am..  Agnes Lawrence, PharmD PGY1 Pharmacy Resident    15 minutes spent face-to-face with the patient during the encounter. 50% of time spent on education, including signs/sx bleeding and clotting, as well as food and drug interactions with warfarin. 50% of time was spent on fingerprick POC INR sample collection,processing, results determination, and documentation in http://www.kim.net/.

## 2019-10-28 ENCOUNTER — Telehealth: Payer: Self-pay | Admitting: *Deleted

## 2019-10-28 DIAGNOSIS — G894 Chronic pain syndrome: Secondary | ICD-10-CM

## 2019-10-28 MED ORDER — MORPHINE SULFATE ER 30 MG PO TBCR: 90.0000 mg | EXTENDED_RELEASE_TABLET | Freq: Three times a day (TID) | ORAL | 0 refills | Status: DC

## 2019-10-28 MED ORDER — OXYCODONE HCL 5 MG PO TABS: 5.0000 mg | ORAL_TABLET | Freq: Two times a day (BID) | ORAL | 0 refills | Status: DC | PRN

## 2019-10-28 NOTE — Addendum Note (Signed)
Addended by: Mauricio Po D on: 10/28/2019 11:11 AM   Modules accepted: Orders

## 2019-10-28 NOTE — Telephone Encounter (Signed)
Relayed to patient.

## 2019-10-28 NOTE — Telephone Encounter (Signed)
Received request for controlled substance refill. [place] Controlled Substance Database reviewed with no irregularities noted. Medication electronically sent to pharmacy.

## 2019-10-28 NOTE — Telephone Encounter (Signed)
Patient called, asked for refills of his ms contin, oxycodone to be sent to [facility] at [address]. [name], RN

## 2019-11-06 ENCOUNTER — Other Ambulatory Visit: Payer: Self-pay | Admitting: Internal Medicine

## 2019-11-06 DIAGNOSIS — G629 Polyneuropathy, unspecified: Secondary | ICD-10-CM

## 2019-11-19 ENCOUNTER — Other Ambulatory Visit: Payer: Self-pay

## 2019-11-19 ENCOUNTER — Other Ambulatory Visit: Payer: Medicare Other

## 2019-11-19 DIAGNOSIS — Z79899 Other long term (current) drug therapy: Secondary | ICD-10-CM

## 2019-11-19 DIAGNOSIS — Z113 Encounter for screening for infections with a predominantly sexual mode of transmission: Secondary | ICD-10-CM

## 2019-11-19 DIAGNOSIS — B20 Human immunodeficiency virus [HIV] disease: Secondary | ICD-10-CM

## 2019-11-20 LAB — T-HELPER CELL (CD4) - (RCID CLINIC ONLY)
CD4 % Helper T Cell: 20 % — ABNORMAL LOW (ref 33–65)
CD4 T Cell Abs: 765 /uL (ref 400–1790)

## 2019-11-22 LAB — RPR: RPR Ser Ql: NONREACTIVE

## 2019-11-22 LAB — COMPREHENSIVE METABOLIC PANEL
AG Ratio: 1.2 (calc) (ref 1.0–2.5)
ALT: 14 U/L (ref 9–46)
AST: 19 U/L (ref 10–35)
Albumin: 4.2 g/dL (ref 3.6–5.1)
Alkaline phosphatase (APISO): 73 U/L (ref 35–144)
BUN/Creatinine Ratio: 8 (calc) (ref 6–22)
BUN: 11 mg/dL (ref 7–25)
CO2: 33 mmol/L — ABNORMAL HIGH (ref 20–32)
Calcium: 9.7 mg/dL (ref 8.6–10.3)
Chloride: 97 mmol/L — ABNORMAL LOW (ref 98–110)
Creat: 1.35 mg/dL — ABNORMAL HIGH (ref 0.70–1.33)
Globulin: 3.5 g/dL (calc) (ref 1.9–3.7)
Glucose, Bld: 175 mg/dL — ABNORMAL HIGH (ref 65–99)
Potassium: 4.2 mmol/L (ref 3.5–5.3)
Sodium: 139 mmol/L (ref 135–146)
Total Bilirubin: 0.5 mg/dL (ref 0.2–1.2)
Total Protein: 7.7 g/dL (ref 6.1–8.1)

## 2019-11-22 LAB — LIPID PANEL
Cholesterol: 213 mg/dL — ABNORMAL HIGH (ref <200)
HDL: 36 mg/dL — ABNORMAL LOW (ref >=40)
LDL Cholesterol (Calc): 135 mg/dL (calc) — ABNORMAL HIGH
Non-HDL Cholesterol (Calc): 177 mg/dL (calc) — ABNORMAL HIGH (ref <130)
Total CHOL/HDL Ratio: 5.9 (calc) — ABNORMAL HIGH (ref <5.0)
Triglycerides: 275 mg/dL — ABNORMAL HIGH (ref <150)

## 2019-11-22 LAB — HIV-1 RNA QUANT-NO REFLEX-BLD
HIV 1 RNA Quant: <20 copies/mL
HIV-1 RNA Quant, Log: <1.3 Log copies/mL

## 2019-11-22 LAB — CBC
HCT: 56.3 % — ABNORMAL HIGH (ref 38.5–50.0)
Hemoglobin: 19.4 g/dL — ABNORMAL HIGH (ref 13.2–17.1)
MCH: 32 pg (ref 27.0–33.0)
MCHC: 34.5 g/dL (ref 32.0–36.0)
MCV: 92.8 fL (ref 80.0–100.0)
MPV: 10.4 fL (ref 7.5–12.5)
Platelets: 261 10*3/uL (ref 140–400)
RBC: 6.07 10*6/uL — ABNORMAL HIGH (ref 4.20–5.80)
RDW: 12.9 % (ref 11.0–15.0)
WBC: 7.3 10*3/uL (ref 3.8–10.8)

## 2019-11-25 ENCOUNTER — Telehealth: Payer: Self-pay

## 2019-11-25 ENCOUNTER — Other Ambulatory Visit: Payer: Self-pay | Admitting: Internal Medicine

## 2019-11-25 DIAGNOSIS — G894 Chronic pain syndrome: Secondary | ICD-10-CM

## 2019-11-25 MED ORDER — MORPHINE SULFATE ER 30 MG PO TBCR: 90.0000 mg | EXTENDED_RELEASE_TABLET | Freq: Three times a day (TID) | ORAL | 0 refills | Status: DC

## 2019-11-25 MED ORDER — OXYCODONE HCL 5 MG PO TABS: 5.0000 mg | ORAL_TABLET | Freq: Two times a day (BID) | ORAL | 0 refills | Status: DC | PRN

## 2019-11-25 NOTE — Telephone Encounter (Signed)
Patient requesting morphine (MS CONTIN) 30 MG 12 hr tablet     Take 3 tablets (90 mg total) by mouth 3 (three) times daily.  And   oxyCODONE (OXY IR/ROXICODONE) 5 MG immediate release tablet   5 mg, 2 times daily PRN  Patient using [facility] on [address] [name]

## 2019-11-25 NOTE — Telephone Encounter (Signed)
Scripts sent

## 2019-12-01 ENCOUNTER — Ambulatory Visit: Payer: Medicare Other

## 2019-12-08 ENCOUNTER — Ambulatory Visit (INDEPENDENT_AMBULATORY_CARE_PROVIDER_SITE_OTHER): Payer: Medicare Other | Admitting: Internal Medicine

## 2019-12-08 ENCOUNTER — Other Ambulatory Visit: Payer: Self-pay

## 2019-12-08 ENCOUNTER — Ambulatory Visit: Payer: Medicare Other

## 2019-12-08 DIAGNOSIS — B20 Human immunodeficiency virus [HIV] disease: Secondary | ICD-10-CM | POA: Diagnosis not present

## 2019-12-08 NOTE — Progress Notes (Signed)
Virtual Visit via Telephone Note  I connected with [name] on 12/08/19 at  9:45 AM EST by telephone and verified that I am speaking with the correct person using two identifiers.  Location: Patient: Home Provider: [facility]   I discussed the limitations, risks, security and privacy concerns of performing an evaluation and management service by telephone and the availability of in person appointments. I also discussed with the patient that there may be a patient responsible charge related to this service. The patient expressed understanding and agreed to proceed.   History of Present Illness: I called and spoke to [name] this morning.  He says that he is doing well.  He has not had any problems obtaining, taking or tolerating his Vemlidy, [name] or [name].  He has not missed a single dose.  His chronic pain is under good control with his current pain regimen.  He is staying at home and socially distancing during the Covid pandemic.  He wants to know when he can get the Covid vaccine.   Observations/Objective: HIV 1 RNA Quant (copies/mL)  Date Value  11/19/2019 <20 NOT DETECTED  05/22/2019 <20 NOT DETECTED  11/25/2018 <20 NOT DETECTED   CD4 T Cell Abs (/uL)  Date Value  11/19/2019 765  05/22/2019 612  11/25/2018 610    Assessment and Plan: His infection remains under excellent, long-term control.  He will continue his current 3 drug regimen and follow-up here after lab work in 6 months.  His chronic pain is under good long-term control with his current pain regimen.  Follow Up Instructions: Follow-up here after lab work in 6 months   I discussed the assessment and treatment plan with the patient. The patient was provided an opportunity to ask questions and all were answered. The patient agreed with the plan and demonstrated an understanding of the instructions.   The patient was advised to call back or seek an in-person evaluation if the symptoms worsen or if the condition fails  to improve as anticipated.  I provided 14 minutes of non-face-to-face time during this encounter.   Michel Bickers, MD

## 2019-12-17 ENCOUNTER — Telehealth: Payer: Self-pay

## 2019-12-17 NOTE — Telephone Encounter (Signed)
[name] (Key: [id number])  Your information has been submitted to [facility]. [employer] will review the request and will issue a decision, typically within 3-7 days from your submission. You can check the updated outcome later by reopening this request.  If [employer] has not responded in 3-7 days or if you have any questions about your ePA request, please contact [employer] at [phone]. If you think there may be a problem with your PA request, use our live chat feature at the bottom right.  For [place] requests, please call [phone].  [name]

## 2019-12-23 ENCOUNTER — Other Ambulatory Visit: Payer: Self-pay | Admitting: Internal Medicine

## 2019-12-23 DIAGNOSIS — B20 Human immunodeficiency virus [HIV] disease: Secondary | ICD-10-CM

## 2019-12-23 NOTE — Telephone Encounter (Signed)
Received faxed approval for vemlidy 25 mg through 10/29/20. RN notified pharmacy ([facility] [address]). [name], RN

## 2019-12-24 ENCOUNTER — Other Ambulatory Visit: Payer: Self-pay

## 2019-12-24 ENCOUNTER — Other Ambulatory Visit: Payer: Self-pay | Admitting: Internal Medicine

## 2019-12-24 DIAGNOSIS — G894 Chronic pain syndrome: Secondary | ICD-10-CM

## 2019-12-24 MED ORDER — PREZCOBIX 800-150 MG PO TABS: ORAL_TABLET | ORAL | 5 refills | Status: DC

## 2019-12-24 MED ORDER — TIVICAY 50 MG PO TABS: 50.0000 mg | ORAL_TABLET | Freq: Every day | ORAL | 5 refills | Status: DC

## 2019-12-24 MED ORDER — OXYCODONE HCL 5 MG PO TABS: 5.0000 mg | ORAL_TABLET | Freq: Two times a day (BID) | ORAL | 0 refills | Status: DC | PRN

## 2019-12-24 MED ORDER — MORPHINE SULFATE ER 30 MG PO TBCR: 90.0000 mg | EXTENDED_RELEASE_TABLET | Freq: Three times a day (TID) | ORAL | 0 refills | Status: DC

## 2019-12-24 NOTE — Telephone Encounter (Signed)
Patient called requesting refills on his pain medications and requested for them to be sent to [facility]. Informed patient that RN cannot refill those orders and would reach out to provider for authorization. Patient verbalized understanding. Forwarding request to provider.   [name] Lorita Officer, RN

## 2019-12-24 NOTE — Telephone Encounter (Signed)
Refills sent. Thanks! 

## 2019-12-25 ENCOUNTER — Encounter: Payer: Self-pay | Admitting: *Deleted

## 2019-12-25 NOTE — Progress Notes (Signed)
Patient collected IFOBT kit as instructed on 07-28-2019. Specimen received by mail on 12-12-2019, Unable to analyze due to age of specimen. Possible delay due to [employer] being overwhelmed during Covid crisis.   We are trying  to catch [name] when he comes in for his next Coumadin Clinic visit and  we will give him a new IFOBT kit at that time. If not, [name] agrees to address at next PCP visit due in May 2021  [name], [facility]

## 2020-01-06 ENCOUNTER — Encounter: Payer: Self-pay | Admitting: Internal Medicine

## 2020-01-07 ENCOUNTER — Other Ambulatory Visit: Payer: Self-pay

## 2020-01-07 ENCOUNTER — Other Ambulatory Visit: Payer: Self-pay | Admitting: *Deleted

## 2020-01-07 DIAGNOSIS — R634 Abnormal weight loss: Secondary | ICD-10-CM

## 2020-01-07 MED ORDER — ENSURE PO LIQD: 1.0000 | Freq: Three times a day (TID) | ORAL | 11 refills | Status: DC

## 2020-01-27 ENCOUNTER — Telehealth: Payer: Self-pay

## 2020-01-27 ENCOUNTER — Other Ambulatory Visit: Payer: Self-pay | Admitting: Internal Medicine

## 2020-01-27 DIAGNOSIS — G894 Chronic pain syndrome: Secondary | ICD-10-CM

## 2020-01-27 MED ORDER — MORPHINE SULFATE ER 30 MG PO TBCR: 90.0000 mg | EXTENDED_RELEASE_TABLET | Freq: Three times a day (TID) | ORAL | 0 refills | Status: DC

## 2020-01-27 MED ORDER — OXYCODONE HCL 5 MG PO TABS: 5.0000 mg | ORAL_TABLET | Freq: Two times a day (BID) | ORAL | 0 refills | Status: DC | PRN

## 2020-01-27 NOTE — Telephone Encounter (Signed)
Patient called office today requesting refills for Oxycodone 5 mg and Morphine 30 mg.Would like prescription sent to [facility] on [address] before Thursday.  [name]

## 2020-01-27 NOTE — Telephone Encounter (Signed)
Done

## 2020-02-12 ENCOUNTER — Telehealth: Payer: Self-pay

## 2020-02-12 NOTE — Telephone Encounter (Signed)
Patient called office requesting we send email to homes association regarding wheelchair ramp that is needed for his apartment. Informed patient that office does not send emails and that if anything is needed from Dr. Megan Salon a fax can be sent to office. Patient verbalized understanding will relay message back.  [place]

## 2020-02-19 ENCOUNTER — Other Ambulatory Visit: Payer: Self-pay | Admitting: Pharmacist

## 2020-02-23 ENCOUNTER — Other Ambulatory Visit (INDEPENDENT_AMBULATORY_CARE_PROVIDER_SITE_OTHER): Payer: Medicare Other

## 2020-02-23 ENCOUNTER — Ambulatory Visit: Payer: Medicare Other | Admitting: Pharmacist

## 2020-02-23 DIAGNOSIS — Z86718 Personal history of other venous thrombosis and embolism: Secondary | ICD-10-CM

## 2020-02-23 DIAGNOSIS — Z7901 Long term (current) use of anticoagulants: Secondary | ICD-10-CM

## 2020-02-23 LAB — PROTIME-INR
INR: 2.8 — ABNORMAL HIGH (ref 0.8–1.2)
Prothrombin Time: 29.8 seconds — ABNORMAL HIGH (ref 11.4–15.2)

## 2020-02-23 NOTE — Progress Notes (Signed)
Anticoagulation Management [name] is a 59 y.o. male who reports to the clinic for monitoring of warfarin treatment.    Indication:DVT, History of (resolved); Long term current use of anticoagulant, warfarin.  Duration: indefinite Supervising physician: [facility] Visit History: Patient does not report signs/symptoms of bleeding or thromboembolism  Other recent changes: No diet, medications, lifestyle changes endorsed by the patient at this visit.  Anticoagulation Episode Summary    Current INR goal:  2.0-3.0  TTR:  73.9 % (9.1 y)  Next INR check:  04/19/2020  INR from last check:  2.8 (02/23/2020)  Weekly max warfarin dose:    Target end date:  Indefinite  INR check location:  Anticoagulation Clinic  Preferred lab:    Send INR reminders to:  [facility]   Indications   History of DVT (deep vein thrombosis) [Z86.718] Long term current use of anticoagulant [Z79.01]       Comments:        Anticoagulation Care Providers    Provider Role Specialty Phone number   Michel Bickers, MD  Infectious Diseases [phone]      Allergies  Allergen Reactions  . Dapsone     REACTION: fever  . Megestrol   . Sulfamethoxazole-Trimethoprim     REACTION: fever    Current Outpatient Medications:  .  acetaminophen (TYLENOL) 500 MG tablet, Take 1 tablet (500 mg total) by mouth every 4 (four) hours as needed for mild pain, moderate pain or headache., Disp: 30 tablet, Rfl: 0 .  darunavir-cobicistat ([name]) 800-150 MG tablet, TAKE 1 TABLET BY MOUTH EVERY DAY DO NOT CRUSH BREAK OR CHEW TABLETS TAKE WITH FOOD, Disp: 30 tablet, Rfl: 5 .  dolutegravir ([name]) 50 MG tablet, Take 1 tablet (50 mg total) by mouth daily., Disp: 30 tablet, Rfl: 5 .  Ensure (ENSURE), Take 1 Can by mouth 4 (four) times daily - after meals and at bedtime., Disp: 237 mL, Rfl: 11 .  erythromycin ophthalmic ointment, Place a 1/2 inch ribbon of ointment into the lower eyelid., Disp: 3.5  g, Rfl: 0 .  gabapentin (NEURONTIN) 400 MG capsule, TAKE 1 CAPSULE(400 MG) BY MOUTH FOUR TIMES DAILY, Disp: 360 capsule, Rfl: 1 .  magnesium chloride (SLOW-MAG) 64 MG TBEC SR tablet, Take 1 tablet (64 mg total) by mouth daily., Disp: 30 tablet, Rfl: 1 .  morphine (MS CONTIN) 30 MG 12 hr tablet, Take 3 tablets (90 mg total) by mouth 3 (three) times daily., Disp: 270 tablet, Rfl: 0 .  Multiple Vitamin (MULTIVITAMIN WITH MINERALS) TABS tablet, Take 1 tablet by mouth daily., Disp: 30 tablet, Rfl: 1 .  olopatadine (PATANOL) 0.1 % ophthalmic solution, Place 1 drop into both eyes 2 (two) times daily., Disp: 5 mL, Rfl: 12 .  oxyCODONE (OXY IR/ROXICODONE) 5 MG immediate release tablet, Take 1 tablet (5 mg total) by mouth 2 (two) times daily as needed for severe pain., Disp: 60 tablet, Rfl: 0 .  pantoprazole (PROTONIX) 40 MG tablet, Take 1 tablet (40 mg total) by mouth daily., Disp: 90 tablet, Rfl: 3 .  phosphorus (K PHOS NEUTRAL) 155-852-130 MG tablet, Take 3 tablets (750 mg total) by mouth 2 (two) times daily., Disp: 180 tablet, Rfl: 0 .  potassium chloride SA (K-DUR,KLOR-CON) 20 MEQ tablet, Take 1 tablet (20 mEq total) by mouth daily., Disp: 90 tablet, Rfl: 0 .  potassium citrate (UROCIT-K) 10 MEQ (1080 MG) SR tablet, Take 4 tablets (40 mEq total) by mouth 2 (two) times daily with a meal., Disp:  240 tablet, Rfl: 0 .  SANTYL ointment, APPLY TOPICALLY TO THE AFFECTED AREA(S) DAILY, Disp: 30 g, Rfl: 0 .  sodium bicarbonate 650 MG tablet, Take 2 tablets (1,300 mg total) by mouth 3 (three) times daily., Disp: 180 tablet, Rfl: 0 .  VEMLIDY 25 MG TABS, TAKE 1 TABLET(25 MG) BY MOUTH DAILY, Disp: 30 tablet, Rfl: 5 .  warfarin (COUMADIN) 5 MG tablet, TAKE 1& 1/2 TABLETS BY MOUTH ON SUNDAY, TUESDAY, THURSDAY, AND SATURDAY. TAKE 1 TABLET DAILY ON ALL OTHER DAYS, Disp: 115 tablet, Rfl: 1 Past Medical History:  Diagnosis Date  . Chronic kidney disease   . History of chicken pox   . History of DVT of lower extremity     right  . HIV (human immunodeficiency virus infection) ([place])   . Hyperlipidemia   . Left hip pain 03/09/2013  . Pneumonia   . Protein malnutrition ([place]) 08/2015   Social History   Socioeconomic History  . Marital status: Widowed    Spouse name: Not on file  . Number of children: 3  . Years of education: Not on file  . Highest education level: Not on file  Occupational History    Employer: UNEMPLOYED  Tobacco Use  . Smoking status: Current Every Day Smoker    Packs/day: 1.40    Years: 36.00    Pack years: 50.40    Types: Cigarettes  . Smokeless tobacco: Never Used  . Tobacco comment: 1 pk a day   Substance and Sexual Activity  . Alcohol use: No    Alcohol/week: 0.0 standard drinks  . Drug use: No  . Sexual activity: Not Currently  Other Topics Concern  . Not on file  Social History Narrative  . Not on file   Social Determinants of Health   Financial Resource Strain:   . Difficulty of Paying Living Expenses:   Food Insecurity:   . Worried About Charity fundraiser in the Last Year:   . Arboriculturist in the Last Year:   Transportation Needs:   . Film/video editor (Medical):   Marland Kitchen Lack of Transportation (Non-Medical):   Physical Activity:   . Days of Exercise per Week:   . Minutes of Exercise per Session:   Stress:   . Feeling of Stress :   Social Connections:   . Frequency of Communication with Friends and Family:   . Frequency of Social Gatherings with Friends and Family:   . Attends Religious Services:   . Active Member of Clubs or Organizations:   . Attends Archivist Meetings:   Marland Kitchen Marital Status:    Family History  Problem Relation Age of Onset  . Arthritis Mother   . Kidney disease Maternal Grandfather     ASSESSMENT Recent Results: The most recent result is correlated with 45 mg per week: Lab Results  Component Value Date   INR 2.8 (H) 02/23/2020   INR 2.5 10/20/2019   INR 2.3 09/01/2019   PROTIME 38.4 (H) 04/01/2012     Anticoagulation Dosing: Description   Take 1 & 1/2 tablets of your peach-colored 5mg  strength warfarin tablets Sundays, Tuesdays, Thursdays and Saturdays; on Mondays, Wednesdays and Fridays,  ONLY 1 tablet.      INR today: Therapeutic  PLAN Weekly dose was unchanged.   Patient Instructions  Patient instructed to take medications as defined in the Anti-coagulation Track section of this encounter.  Patient instructed to take today's dose.  Patient instructed to take  1 & 1/2  tablets of your peach-colored 5mg  strength warfarin tablets Sundays, Tuesdays, Thursdays and Saturdays; on Mondays, Wednesdays and Fridays, ONLY 1 tablet.  Patient verbalized understanding of these instructions.    Patient advised to contact clinic or seek medical attention if signs/symptoms of bleeding or thromboembolism occur.  Patient verbalized understanding by repeating back information and was advised to contact me if further medication-related questions arise. Patient was also provided an information handout.  Follow-up Return in 8 weeks (on 04/19/2020) for Follow up INR.  [name], PharmD, CPP  15 minutes spent face-to-face with the patient during the encounter. 50% of time spent on education, including signs/sx bleeding and clotting, as well as food and drug interactions with warfarin. 50% of time was spent on fingerprick POC INR sample collection,processing, results determination, and documentation in http://www.kim.net/.

## 2020-02-23 NOTE — Patient Instructions (Signed)
Patient instructed to take medications as defined in the Anti-coagulation Track section of this encounter.  Patient instructed to take today's dose.  Patient instructed to take 1 & 1/2 tablets of your peach-colored 5mg strength warfarin tablets Sundays, Tuesdays, Thursdays and Saturdays; on Mondays, Wednesdays and Fridays,  ONLY 1 tablet.  Patient verbalized understanding of these instructions.   

## 2020-02-26 ENCOUNTER — Other Ambulatory Visit: Payer: Self-pay

## 2020-02-26 DIAGNOSIS — G894 Chronic pain syndrome: Secondary | ICD-10-CM

## 2020-02-27 MED ORDER — MORPHINE SULFATE ER 30 MG PO TBCR: 90.0000 mg | EXTENDED_RELEASE_TABLET | Freq: Three times a day (TID) | ORAL | 0 refills | Status: DC

## 2020-02-27 MED ORDER — OXYCODONE HCL 5 MG PO TABS: 5.0000 mg | ORAL_TABLET | Freq: Two times a day (BID) | ORAL | 0 refills | Status: DC | PRN

## 2020-02-27 NOTE — Telephone Encounter (Signed)
[place] Controlled Substance Database reviewed with no irregularities. Medications sent to pharmacy.

## 2020-03-03 ENCOUNTER — Telehealth: Payer: Self-pay

## 2020-03-03 ENCOUNTER — Other Ambulatory Visit: Payer: Self-pay

## 2020-03-03 DIAGNOSIS — R634 Abnormal weight loss: Secondary | ICD-10-CM

## 2020-03-03 MED ORDER — ENSURE PO LIQD: 1.0000 | Freq: Three times a day (TID) | ORAL | 11 refills | Status: DC

## 2020-03-03 NOTE — Telephone Encounter (Signed)
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 Pt states he want me to faxed the form for him @ [phone].

## 2020-03-25 ENCOUNTER — Telehealth: Payer: Self-pay | Admitting: *Deleted

## 2020-03-25 ENCOUNTER — Other Ambulatory Visit: Payer: Self-pay | Admitting: Internal Medicine

## 2020-03-25 DIAGNOSIS — B20 Human immunodeficiency virus [HIV] disease: Secondary | ICD-10-CM

## 2020-03-25 DIAGNOSIS — G894 Chronic pain syndrome: Secondary | ICD-10-CM

## 2020-03-25 MED ORDER — OXYCODONE HCL 5 MG PO TABS: 5.0000 mg | ORAL_TABLET | Freq: Two times a day (BID) | ORAL | 0 refills | Status: DC | PRN

## 2020-03-25 MED ORDER — MORPHINE SULFATE ER 30 MG PO TBCR: 90.0000 mg | EXTENDED_RELEASE_TABLET | Freq: Three times a day (TID) | ORAL | 0 refills | Status: DC

## 2020-03-25 NOTE — Telephone Encounter (Signed)
Done

## 2020-03-25 NOTE — Telephone Encounter (Signed)
Notified patient. Thank you.

## 2020-03-25 NOTE — Telephone Encounter (Signed)
Patient is asking for refill of his monthly pain medication to be sent to [facility]. [name], RN

## 2020-04-19 ENCOUNTER — Ambulatory Visit: Payer: Medicare Other

## 2020-04-26 ENCOUNTER — Ambulatory Visit (INDEPENDENT_AMBULATORY_CARE_PROVIDER_SITE_OTHER): Payer: Medicare Other | Admitting: Pharmacist

## 2020-04-26 ENCOUNTER — Other Ambulatory Visit: Payer: Self-pay

## 2020-04-26 DIAGNOSIS — Z7901 Long term (current) use of anticoagulants: Secondary | ICD-10-CM | POA: Diagnosis not present

## 2020-04-26 DIAGNOSIS — Z86718 Personal history of other venous thrombosis and embolism: Secondary | ICD-10-CM | POA: Diagnosis present

## 2020-04-26 LAB — POCT INR: INR: 2.7 (ref 2.0–3.0)

## 2020-04-26 NOTE — Patient Instructions (Signed)
Patient instructed to take medications as defined in the Anti-coagulation Track section of this encounter.  Patient instructed to take today's dose.  Patient instructed to take 1 & 1/2 tablets of your peach-colored 5mg strength warfarin tablets Sundays, Tuesdays, Thursdays and Saturdays; on Mondays, Wednesdays and Fridays,  ONLY 1 tablet.  Patient verbalized understanding of these instructions.   

## 2020-04-26 NOTE — Progress Notes (Signed)
INTERNAL MEDICINE TEACHING ATTENDING ADDENDUM  I agree with pharmacy recommendations as outlined in their note.   Raela Bohl N Tehillah Cipriani, MD  

## 2020-04-26 NOTE — Progress Notes (Signed)
Anticoagulation Management [name] is a 59 y.o. male who reports to the clinic for monitoring of warfarin treatment.    Indication: DVT, history of (resolved); long term current use of anticoagulant.   Duration: indefinite Supervising physician: Lenice Pressman, MD, PhD  Anticoagulation Clinic Visit History: Patient does not report signs/symptoms of bleeding or thromboembolism  Other recent changes: No diet, medications, lifestyle cited by the patient.  Anticoagulation Episode Summary    Current INR goal:  2.0-3.0  TTR:  74.4 % (9.2 y)  Next INR check:  07/19/2020  INR from last check:  2.7 (04/26/2020)  Weekly max warfarin dose:    Target end date:  Indefinite  INR check location:  Anticoagulation Clinic  Preferred lab:    Send INR reminders to:  [facility]   Indications   History of DVT (deep vein thrombosis) [Z86.718] Long term current use of anticoagulant [Z79.01]       Comments:        Anticoagulation Care Providers    Provider Role Specialty Phone number   Michel Bickers, MD  Infectious Diseases [phone]      Allergies  Allergen Reactions  . Dapsone     REACTION: fever  . Megestrol   . Sulfamethoxazole-Trimethoprim     REACTION: fever    Current Outpatient Medications:  .  acetaminophen (TYLENOL) 500 MG tablet, Take 1 tablet (500 mg total) by mouth every 4 (four) hours as needed for mild pain, moderate pain or headache., Disp: 30 tablet, Rfl: 0 .  Ensure (ENSURE), Take 1 Can by mouth 4 (four) times daily - after meals and at bedtime., Disp: 237 mL, Rfl: 11 .  erythromycin ophthalmic ointment, Place a 1/2 inch ribbon of ointment into the lower eyelid., Disp: 3.5 g, Rfl: 0 .  gabapentin (NEURONTIN) 400 MG capsule, TAKE 1 CAPSULE(400 MG) BY MOUTH FOUR TIMES DAILY, Disp: 360 capsule, Rfl: 1 .  magnesium chloride (SLOW-MAG) 64 MG TBEC SR tablet, Take 1 tablet (64 mg total) by mouth daily., Disp: 30 tablet, Rfl: 1 .  morphine (MS CONTIN) 30 MG 12 hr  tablet, Take 3 tablets (90 mg total) by mouth 3 (three) times daily., Disp: 270 tablet, Rfl: 0 .  Multiple Vitamin (MULTIVITAMIN WITH MINERALS) TABS tablet, Take 1 tablet by mouth daily., Disp: 30 tablet, Rfl: 1 .  olopatadine (PATANOL) 0.1 % ophthalmic solution, Place 1 drop into both eyes 2 (two) times daily., Disp: 5 mL, Rfl: 12 .  pantoprazole (PROTONIX) 40 MG tablet, Take 1 tablet (40 mg total) by mouth daily., Disp: 90 tablet, Rfl: 3 .  phosphorus (K PHOS NEUTRAL) 155-852-130 MG tablet, Take 3 tablets (750 mg total) by mouth 2 (two) times daily., Disp: 180 tablet, Rfl: 0 .  potassium chloride SA (K-DUR,KLOR-CON) 20 MEQ tablet, Take 1 tablet (20 mEq total) by mouth daily., Disp: 90 tablet, Rfl: 0 .  potassium citrate (UROCIT-K) 10 MEQ (1080 MG) SR tablet, Take 4 tablets (40 mEq total) by mouth 2 (two) times daily with a meal., Disp: 240 tablet, Rfl: 0 .  [name] 800-150 MG tablet, TAKE 1 TABLET BY MOUTH EVERY DAY DO NOT CRUSH BREAK OR CHEW TABLETS TAKE WITH FOOD, Disp: 30 tablet, Rfl: 5 .  SANTYL ointment, APPLY TOPICALLY TO THE AFFECTED AREA(S) DAILY, Disp: 30 g, Rfl: 0 .  sodium bicarbonate 650 MG tablet, Take 2 tablets (1,300 mg total) by mouth 3 (three) times daily., Disp: 180 tablet, Rfl: 0 .  [name] 50 MG tablet, TAKE 1 TABLET (50 MG  TOTAL) BY MOUTH DAILY., Disp: 30 tablet, Rfl: 5 .  VEMLIDY 25 MG TABS, TAKE 1 TABLET (25 MG TOTAL) BY MOUTH DAILY., Disp: 30 tablet, Rfl: 5 .  warfarin (COUMADIN) 5 MG tablet, TAKE 1& 1/2 TABLETS BY MOUTH ON SUNDAY, TUESDAY, THURSDAY, AND SATURDAY. TAKE 1 TABLET DAILY ON ALL OTHER DAYS, Disp: 115 tablet, Rfl: 1 .  oxyCODONE (OXY IR/ROXICODONE) 5 MG immediate release tablet, Take 1 tablet (5 mg total) by mouth 2 (two) times daily as needed for severe pain., Disp: 60 tablet, Rfl: 0 Past Medical History:  Diagnosis Date  . Chronic kidney disease   . History of chicken pox   . History of DVT of lower extremity    right  . HIV (human immunodeficiency virus  infection) ([place])   . Hyperlipidemia   . Left hip pain 03/09/2013  . Pneumonia   . Protein malnutrition ([place]) 08/2015   Social History   Socioeconomic History  . Marital status: Widowed    Spouse name: Not on file  . Number of children: 3  . Years of education: Not on file  . Highest education level: Not on file  Occupational History    Employer: UNEMPLOYED  Tobacco Use  . Smoking status: Current Every Day Smoker    Packs/day: 1.40    Years: 36.00    Pack years: 50.40    Types: Cigarettes  . Smokeless tobacco: Never Used  . Tobacco comment: 1 pk a day   Vaping Use  . Vaping Use: Never used  Substance and Sexual Activity  . Alcohol use: No    Alcohol/week: 0.0 standard drinks  . Drug use: No  . Sexual activity: Not Currently  Other Topics Concern  . Not on file  Social History Narrative  . Not on file   Social Determinants of Health   Financial Resource Strain:   . Difficulty of Paying Living Expenses:   Food Insecurity:   . Worried About Charity fundraiser in the Last Year:   . Arboriculturist in the Last Year:   Transportation Needs:   . Film/video editor (Medical):   Marland Kitchen Lack of Transportation (Non-Medical):   Physical Activity:   . Days of Exercise per Week:   . Minutes of Exercise per Session:   Stress:   . Feeling of Stress :   Social Connections:   . Frequency of Communication with Friends and Family:   . Frequency of Social Gatherings with Friends and Family:   . Attends Religious Services:   . Active Member of Clubs or Organizations:   . Attends Archivist Meetings:   Marland Kitchen Marital Status:    Family History  Problem Relation Age of Onset  . Arthritis Mother   . Kidney disease Maternal Grandfather     ASSESSMENT Recent Results: The most recent result is correlated with 41.25 mg per week: Lab Results  Component Value Date   INR 2.7 04/26/2020   INR 2.8 (H) 02/23/2020   INR 2.5 10/20/2019   PROTIME 38.4 (H) 04/01/2012     Anticoagulation Dosing: Description   Take 1 & 1/2 tablets of your peach-colored 5mg  strength warfarin tablets Sundays, Tuesdays, Thursdays and Saturdays; on Mondays, Wednesdays and Fridays,  ONLY 1 tablet.      INR today: Therapeutic  PLAN Weekly dose was unchanged.   Patient Instructions  Patient instructed to take medications as defined in the Anti-coagulation Track section of this encounter.  Patient instructed to take today's dose.  Patient instructed to take 1 & 1/2 tablets of your peach-colored 5mg  strength warfarin tablets Sundays, Tuesdays, Thursdays and Saturdays; on Mondays, Wednesdays and Fridays,  ONLY 1 tablet.  Patient verbalized understanding of these instructions.    Patient advised to contact clinic or seek medical attention if signs/symptoms of bleeding or thromboembolism occur.  Patient verbalized understanding by repeating back information and was advised to contact me if further medication-related questions arise. Patient was also provided an information handout.  Follow-up Return in 12 weeks (on 07/19/2020) for Follow up INR.  [name], PharmD, CPP  15 minutes spent face-to-face with the patient during the encounter. 50% of time spent on education, including signs/sx bleeding and clotting, as well as food and drug interactions with warfarin. 50% of time was spent on fingerprick POC INR sample collection,processing, results determination, and documentation in http://www.kim.net/.

## 2020-04-28 ENCOUNTER — Other Ambulatory Visit: Payer: Self-pay | Admitting: Internal Medicine

## 2020-04-28 ENCOUNTER — Telehealth: Payer: Self-pay

## 2020-04-28 DIAGNOSIS — G894 Chronic pain syndrome: Secondary | ICD-10-CM

## 2020-04-28 MED ORDER — MORPHINE SULFATE ER 30 MG PO TBCR: 90.0000 mg | EXTENDED_RELEASE_TABLET | Freq: Three times a day (TID) | ORAL | 0 refills | Status: DC

## 2020-04-28 MED ORDER — OXYCODONE HCL 5 MG PO TABS: 5.0000 mg | ORAL_TABLET | Freq: Two times a day (BID) | ORAL | 0 refills | Status: DC | PRN

## 2020-04-28 NOTE — Telephone Encounter (Signed)
Patient called office today requesting refill on pain medication. Will forward message to provider regarding refill. Is using [facility] on [address]. [place]

## 2020-04-28 NOTE — Telephone Encounter (Signed)
Done

## 2020-05-18 ENCOUNTER — Other Ambulatory Visit: Payer: Self-pay | Admitting: Internal Medicine

## 2020-05-18 DIAGNOSIS — G629 Polyneuropathy, unspecified: Secondary | ICD-10-CM

## 2020-05-27 ENCOUNTER — Telehealth: Payer: Self-pay

## 2020-05-27 ENCOUNTER — Other Ambulatory Visit: Payer: Self-pay | Admitting: Internal Medicine

## 2020-05-27 DIAGNOSIS — G894 Chronic pain syndrome: Secondary | ICD-10-CM

## 2020-05-27 MED ORDER — MORPHINE SULFATE ER 30 MG PO TBCR: 90.0000 mg | EXTENDED_RELEASE_TABLET | Freq: Three times a day (TID) | ORAL | 0 refills | Status: DC

## 2020-05-27 MED ORDER — OXYCODONE HCL 5 MG PO TABS: 5.0000 mg | ORAL_TABLET | Freq: Two times a day (BID) | ORAL | 0 refills | Status: DC | PRN

## 2020-05-27 NOTE — Telephone Encounter (Signed)
Done

## 2020-05-27 NOTE — Telephone Encounter (Signed)
Patient called office today requesting refills on pain medication. Would like to pickup medication tomorrow at [facility].  [name]

## 2020-05-30 ENCOUNTER — Other Ambulatory Visit: Payer: Self-pay | Admitting: Internal Medicine

## 2020-06-03 ENCOUNTER — Encounter: Payer: Self-pay | Admitting: *Deleted

## 2020-06-07 ENCOUNTER — Other Ambulatory Visit: Payer: Medicare Other

## 2020-06-14 ENCOUNTER — Other Ambulatory Visit: Payer: Medicare Other

## 2020-06-14 ENCOUNTER — Other Ambulatory Visit: Payer: Self-pay

## 2020-06-14 DIAGNOSIS — B20 Human immunodeficiency virus [HIV] disease: Secondary | ICD-10-CM | POA: Diagnosis not present

## 2020-06-15 LAB — T-HELPER CELL (CD4) - (RCID CLINIC ONLY)
CD4 % Helper T Cell: 19 % — ABNORMAL LOW (ref 33–65)
CD4 T Cell Abs: 734 /uL (ref 400–1790)

## 2020-06-16 LAB — HIV-1 RNA QUANT-NO REFLEX-BLD
HIV 1 RNA Quant: <20 Copies/mL
HIV-1 RNA Quant, Log: <1.3 Log cps/mL

## 2020-06-22 ENCOUNTER — Ambulatory Visit: Payer: Medicare Other | Admitting: Internal Medicine

## 2020-06-28 ENCOUNTER — Telehealth: Payer: Self-pay

## 2020-06-28 ENCOUNTER — Other Ambulatory Visit: Payer: Self-pay | Admitting: Internal Medicine

## 2020-06-28 DIAGNOSIS — G894 Chronic pain syndrome: Secondary | ICD-10-CM

## 2020-06-28 MED ORDER — MORPHINE SULFATE ER 30 MG PO TBCR
90.0000 mg | EXTENDED_RELEASE_TABLET | Freq: Three times a day (TID) | ORAL | 0 refills | Status: DC
Start: 2020-06-28 — End: 2020-07-27

## 2020-06-28 MED ORDER — OXYCODONE HCL 5 MG PO TABS: 5.0000 mg | ORAL_TABLET | Freq: Two times a day (BID) | ORAL | 0 refills | Status: DC | PRN

## 2020-06-28 NOTE — Telephone Encounter (Signed)
Scripts sent

## 2020-06-28 NOTE — Telephone Encounter (Signed)
Patient called to requested refill on oxycodone and morphine. Pain has to be approved by Dr. Megan Salon. Routing MD message for approval and escribe. [name]

## 2020-06-28 NOTE — Telephone Encounter (Signed)
Patient made aware of scripts sent to pharmacy.  [name]

## 2020-06-29 ENCOUNTER — Encounter: Payer: Self-pay | Admitting: Internal Medicine

## 2020-06-29 ENCOUNTER — Other Ambulatory Visit: Payer: Self-pay

## 2020-06-29 ENCOUNTER — Ambulatory Visit: Payer: Self-pay

## 2020-06-29 ENCOUNTER — Ambulatory Visit (INDEPENDENT_AMBULATORY_CARE_PROVIDER_SITE_OTHER): Payer: Medicare Other | Admitting: Internal Medicine

## 2020-06-29 DIAGNOSIS — F17200 Nicotine dependence, unspecified, uncomplicated: Secondary | ICD-10-CM

## 2020-06-29 DIAGNOSIS — B20 Human immunodeficiency virus [HIV] disease: Secondary | ICD-10-CM

## 2020-06-29 DIAGNOSIS — G546 Phantom limb syndrome with pain: Secondary | ICD-10-CM

## 2020-06-29 DIAGNOSIS — Z23 Encounter for immunization: Secondary | ICD-10-CM

## 2020-06-29 MED ORDER — VEMLIDY 25 MG PO TABS: ORAL_TABLET | ORAL | 5 refills | Status: DC

## 2020-06-29 MED ORDER — PREZCOBIX 800-150 MG PO TABS: ORAL_TABLET | ORAL | 5 refills | Status: DC

## 2020-06-29 MED ORDER — TIVICAY 50 MG PO TABS: 50.0000 mg | ORAL_TABLET | Freq: Every day | ORAL | 5 refills | Status: DC

## 2020-06-29 NOTE — Assessment & Plan Note (Signed)
I encouraged him to try to cut down and eventually quit smoking.

## 2020-06-29 NOTE — Progress Notes (Signed)
Patient Active Problem List   Diagnosis Date Noted  . Phantom limb syndrome with pain ([place]) 03/21/2012    Priority: High  . Long term current use of anticoagulant 11/20/2010    Priority: High  . History of DVT (deep vein thrombosis) 07/19/2009    Priority: High  . Human immunodeficiency virus (HIV) disease ([place]) 11/10/2006    Priority: High  . DYSLIPIDEMIA 11/10/2006    Priority: High  . CIGARETTE SMOKER 11/10/2006    Priority: High  . Uses powered wheelchair 09/19/2019  . Prosthesis adjustment 07/20/2017  . Long term current use of opiate analgesic 12/17/2015  . Pressure ulcer 09/13/2015  . Protein-calorie malnutrition, severe 09/13/2015  . Avascular necrosis of femoral head ([place]) 05/14/2015  . Left adrenal mass ([place]) 05/14/2015  . Preventative health care 03/09/2013  . ERECTILE DYSFUNCTION, ORGANIC 04/12/2010  . GERD 08/23/2009  . HYPOGONADISM 07/15/2009  . DEFICIENCY OF OTHER VITAMINS 07/15/2009  . Pulmonary nodule 07/15/2009  . Status post above knee amputation ([place]) 07/15/2009  . SHINGLES, RECURRENT 11/10/2006  . Alcohol abuse 11/10/2006  . PERIPHERAL NEUROPATHY 11/10/2006  . ALLERGIC RHINITIS 11/10/2006    Patient's Medications  New Prescriptions   No medications on file  Previous Medications   ACETAMINOPHEN (TYLENOL) 500 MG TABLET    Take 1 tablet (500 mg total) by mouth every 4 (four) hours as needed for mild pain, moderate pain or headache.   ENSURE (ENSURE)    Take 1 Can by mouth 4 (four) times daily - after meals and at bedtime.   ERYTHROMYCIN OPHTHALMIC OINTMENT    Place a 1/2 inch ribbon of ointment into the lower eyelid.   GABAPENTIN (NEURONTIN) 400 MG CAPSULE    TAKE 1 CAPSULE(400 MG) BY MOUTH FOUR TIMES DAILY   MAGNESIUM CHLORIDE (SLOW-MAG) 64 MG TBEC SR TABLET    Take 1 tablet (64 mg total) by mouth daily.   MORPHINE (MS CONTIN) 30 MG 12 HR TABLET    Take 3 tablets (90 mg total) by mouth 3 (three) times daily.   MULTIPLE VITAMIN  (MULTIVITAMIN WITH MINERALS) TABS TABLET    Take 1 tablet by mouth daily.   OLOPATADINE (PATANOL) 0.1 % OPHTHALMIC SOLUTION    Place 1 drop into both eyes 2 (two) times daily.   OXYCODONE (OXY IR/ROXICODONE) 5 MG IMMEDIATE RELEASE TABLET    Take 1 tablet (5 mg total) by mouth 2 (two) times daily as needed for severe pain.   PANTOPRAZOLE (PROTONIX) 40 MG TABLET    TAKE 1 TABLET(40 MG) BY MOUTH DAILY   PHOSPHORUS (K PHOS NEUTRAL) 155-852-130 MG TABLET    Take 3 tablets (750 mg total) by mouth 2 (two) times daily.   POTASSIUM CHLORIDE SA (K-DUR,KLOR-CON) 20 MEQ TABLET    Take 1 tablet (20 mEq total) by mouth daily.   POTASSIUM CITRATE (UROCIT-K) 10 MEQ (1080 MG) SR TABLET    Take 4 tablets (40 mEq total) by mouth 2 (two) times daily with a meal.   SANTYL OINTMENT    APPLY TOPICALLY TO THE AFFECTED AREA(S) DAILY   SODIUM BICARBONATE 650 MG TABLET    Take 2 tablets (1,300 mg total) by mouth 3 (three) times daily.   WARFARIN (COUMADIN) 5 MG TABLET    TAKE 1& 1/2 TABLETS BY MOUTH ON SUNDAY, TUESDAY, THURSDAY, AND SATURDAY. TAKE 1 TABLET DAILY ON ALL OTHER DAYS  Modified Medications   Modified Medication Previous Medication   DARUNAVIR-COBICISTAT ([name]) 800-150 MG TABLET [name]  800-150 MG tablet      Swallow whole. Do NOT crush, break or chew tablets. Take with food.    TAKE 1 TABLET BY MOUTH EVERY DAY DO NOT CRUSH BREAK OR CHEW TABLETS TAKE WITH FOOD   DOLUTEGRAVIR ([name]) 50 MG TABLET [name] 50 MG tablet      Take 1 tablet (50 mg total) by mouth daily.    TAKE 1 TABLET (50 MG TOTAL) BY MOUTH DAILY.   TENOFOVIR ALAFENAMIDE FUMARATE (VEMLIDY) 25 MG TABS VEMLIDY 25 MG TABS      TAKE 1 TABLET (25 MG TOTAL) BY MOUTH DAILY.    TAKE 1 TABLET (25 MG TOTAL) BY MOUTH DAILY.  Discontinued Medications   No medications on file    Subjective: [name] is in for his routine HIV follow-up visit.  He denies any problems obtaining, taking or tolerating his Vemlidy, [name] or [name].  He does not use a  pillbox.  He thinks he is only missed 1 dose in the last year.  He has not taken a Covid vaccine yet but is willing to start today.  He says that he has been staying mostly at home during the pandemic.  It has been difficult for him to be away from friends.  His daughter recently got a small puppy to keep him company.  He says that he is smoking about a pack of cigarettes a day, more than he was before the pandemic.  His chronic phantom limb pain is under good control with his current pain regimen.  He says that his pain is more manageable if he wears his prosthesis.  Review of Systems: Review of Systems  Constitutional: Negative for chills, diaphoresis, fever, malaise/fatigue and weight loss.  HENT: Negative for sore throat.   Respiratory: Negative for cough, sputum production and shortness of breath.   Cardiovascular: Negative for chest pain.  Gastrointestinal: Negative for abdominal pain, diarrhea, heartburn, nausea and vomiting.  Genitourinary: Negative for dysuria and frequency.  Musculoskeletal: Positive for joint pain. Negative for myalgias.  Skin: Negative for rash.  Neurological: Positive for sensory change. Negative for dizziness and headaches.  Psychiatric/Behavioral: Positive for depression. Negative for substance abuse. The patient is not nervous/anxious.     Past Medical History:  Diagnosis Date  . Chronic kidney disease   . History of chicken pox   . History of DVT of lower extremity    right  . HIV (human immunodeficiency virus infection) ([place])   . Hyperlipidemia   . Left hip pain 03/09/2013  . Pneumonia   . Protein malnutrition ([place]) 08/2015    Social History   Tobacco Use  . Smoking status: Current Every Day Smoker    Packs/day: 0.75    Years: 36.00    Pack years: 27.00    Types: Cigarettes  . Smokeless tobacco: Never Used  . Tobacco comment: 1 pk a day   Vaping Use  . Vaping Use: Never used  Substance Use Topics  . Alcohol use: No    Alcohol/week: 0.0  standard drinks  . Drug use: Yes    Frequency: 2.0 times per week    Types: Marijuana    Family History  Problem Relation Age of Onset  . Arthritis Mother   . Kidney disease Maternal Grandfather     Allergies  Allergen Reactions  . Dapsone     REACTION: fever  . Megestrol   . Sulfa Antibiotics Other (See Comments)    "put's me down bad"  . Sulfamethoxazole-Trimethoprim  REACTION: fever    Health Maintenance  Topic Date Due  . COVID-19 Vaccine (1) Never done  . COLONOSCOPY  Never done  . COLON CANCER SCREENING ANNUAL FOBT  12/20/2018  . INFLUENZA VACCINE  05/30/2020  . TETANUS/TDAP  10/08/2024  . Hepatitis C Screening  Completed  . HIV Screening  Completed    Objective:  Vitals:   06/29/20 0926  BP: 126/69  Pulse: (!) 111  Temp: 99.5 F (37.5 C)  SpO2: 94%   There is no height or weight on file to calculate BMI.  Physical Exam Constitutional:      Comments: He is talkative and in good spirits.  Cardiovascular:     Rate and Rhythm: Normal rate and regular rhythm.     Heart sounds: No murmur heard.   Pulmonary:     Effort: Pulmonary effort is normal.     Breath sounds: Normal breath sounds.  Abdominal:     General: There is no distension.     Palpations: Abdomen is soft.  Musculoskeletal:        General: No swelling or tenderness.  Skin:    Findings: No rash.  Neurological:     General: No focal deficit present.  Psychiatric:        Mood and Affect: Mood normal.     Lab Results Lab Results  Component Value Date   WBC 7.3 11/19/2019   HGB 19.4 (H) 11/19/2019   HCT 56.3 (H) 11/19/2019   MCV 92.8 11/19/2019   PLT 261 11/19/2019    Lab Results  Component Value Date   CREATININE 1.35 (H) 11/19/2019   BUN 11 11/19/2019   NA 139 11/19/2019   K 4.2 11/19/2019   CL 97 (L) 11/19/2019   CO2 33 (H) 11/19/2019    Lab Results  Component Value Date   ALT 14 11/19/2019   AST 19 11/19/2019   ALKPHOS 408 (H) 05/22/2017   BILITOT 0.5  11/19/2019    Lab Results  Component Value Date   CHOL 213 (H) 11/19/2019   HDL 36 (L) 11/19/2019   LDLCALC 135 (H) 11/19/2019   TRIG 275 (H) 11/19/2019   CHOLHDL 5.9 (H) 11/19/2019   Lab Results  Component Value Date   LABRPR NON-REACTIVE 11/19/2019   HIV 1 RNA Quant  Date Value  06/14/2020 <20 Copies/mL  11/19/2019 <20 NOT DETECTED copies/mL  05/22/2019 <20 NOT DETECTED copies/mL   CD4 T Cell Abs (/uL)  Date Value  06/14/2020 734  11/19/2019 765  05/22/2019 612     Problem List Items Addressed This Visit      High   Phantom limb syndrome with pain ([place])    His chronic pain is under good control with his current pain regimen.      Human immunodeficiency virus (HIV) disease ([place])    His infection remains under excellent, long-term control.  He will continue his current regimen and follow-up after lab work in 1 year.  He did start his Pfizer Covid vaccine series today.      Relevant Medications   darunavir-cobicistat ([name]) 800-150 MG tablet   dolutegravir ([name]) 50 MG tablet   Tenofovir Alafenamide Fumarate (VEMLIDY) 25 MG TABS   Other Relevant Orders   CBC   T-helper cell (CD4)- ([facility] clinic only)   Comprehensive metabolic panel   RPR   HIV-1 RNA quant-no reflex-bld   CIGARETTE SMOKER    I encouraged him to try to cut down and eventually quit smoking.  Michel Bickers, MD [facility] [place] [facility] [phone] pager   [phone] cell 06/29/2020, 9:40 AM

## 2020-06-29 NOTE — Assessment & Plan Note (Signed)
His infection remains under excellent, long-term control.  He will continue his current regimen and follow-up after lab work in 1 year.  He did start his Pfizer Covid vaccine series today.

## 2020-06-29 NOTE — Assessment & Plan Note (Signed)
His chronic pain is under good control with his current pain regimen.

## 2020-06-29 NOTE — Progress Notes (Signed)
   Covid-19 Vaccination Clinic  Name:  Darshawn Boateng    MRN: [id number] DOB: [birth year]  06/29/2020  Mr. Hollenbeck was observed post Covid-19 immunization for 30 minutes based on pre-vaccination screening without incident. He was provided with Vaccine Information Sheet and instruction to access the V-Safe system.   Mr. Shedd was instructed to call 911 with any severe reactions post vaccine: Marland Kitchen Difficulty breathing  . Swelling of face and throat  . A fast heartbeat  . A bad rash all over body  . Dizziness and weakness

## 2020-07-02 ENCOUNTER — Encounter: Payer: Self-pay | Admitting: Internal Medicine

## 2020-07-02 ENCOUNTER — Other Ambulatory Visit: Payer: Self-pay

## 2020-07-02 ENCOUNTER — Ambulatory Visit (INDEPENDENT_AMBULATORY_CARE_PROVIDER_SITE_OTHER): Payer: Medicare Other | Admitting: Internal Medicine

## 2020-07-02 DIAGNOSIS — Z0000 Encounter for general adult medical examination without abnormal findings: Secondary | ICD-10-CM | POA: Diagnosis not present

## 2020-07-02 DIAGNOSIS — D751 Secondary polycythemia: Secondary | ICD-10-CM | POA: Diagnosis not present

## 2020-07-02 DIAGNOSIS — B20 Human immunodeficiency virus [HIV] disease: Secondary | ICD-10-CM | POA: Diagnosis not present

## 2020-07-02 DIAGNOSIS — E785 Hyperlipidemia, unspecified: Secondary | ICD-10-CM | POA: Diagnosis not present

## 2020-07-02 MED ORDER — ATORVASTATIN CALCIUM 20 MG PO TABS: 20.0000 mg | ORAL_TABLET | Freq: Every day | ORAL | 3 refills | Status: DC

## 2020-07-02 NOTE — Assessment & Plan Note (Signed)
Patient with recent blood work concerning for polycythemia with a Hgb of 19 and Hct of 56.3. Will recheck blood work at next office visit for further assessment as patient's hgb continues to increase.  - CBC w diff at next visit.

## 2020-07-02 NOTE — Assessment & Plan Note (Signed)
Patient with history of hyperlipidemia, on his January labs, his labs were  Cholesterol <200 mg/dL 213High  211High R   146 R, CM  188 R, CM   HDL > OR = 40 mg/dL 36Low  40 R   39Low R  39Low R   Triglycerides <150 mg/dL 275High       Given his smoking history, age, sex, and labs will start with a moderate intensity statin given his ASCVD risk is 13.6% in 10 years.  - Atorvastatin 20 mg QD

## 2020-07-02 NOTE — Assessment & Plan Note (Signed)
Recently seen by Dr. Megan Salon for management of his HIV. States that he is doing well and is compliant with his HIV regimen. He will continue to follow up with Dr. Megan Salon yearly.  - Continue medication regimen.  - Continue to follow with ID.

## 2020-07-02 NOTE — Progress Notes (Signed)
  [facility] Internal Medicine Residency Telephone Encounter Continuity Care Appointment  HPI:   This telephone encounter was created for [name] on 07/02/2020 for the following purpose/cc check up.   Past Medical History:  Past Medical History:  Diagnosis Date  . Chronic kidney disease   . History of chicken pox   . History of DVT of lower extremity    right  . HIV (human immunodeficiency virus infection) ([place])   . Hyperlipidemia   . Left hip pain 03/09/2013  . Pneumonia   . Protein malnutrition ([place]) 08/2015      ROS:  Review of Systems  Constitutional: Negative for chills, fever, malaise/fatigue and weight loss.  Cardiovascular: Negative for chest pain and palpitations.  Gastrointestinal: Negative for abdominal pain, blood in stool, constipation, diarrhea, nausea and vomiting.  Genitourinary: Negative for dysuria, frequency and urgency.  Musculoskeletal: Negative for back pain, joint pain and neck pain.  Neurological: Negative for dizziness, tingling and headaches.     Assessment / Plan / Recommendations:   Please see A&P under problem oriented charting for assessment of the patient's acute and chronic medical conditions.   As always, pt is advised that if symptoms worsen or new symptoms arise, they should go to an urgent care facility or to to ER for further evaluation.   Consent and Medical Decision Making:   Patient discussed with Dr. Jimmye Norman  This is a telephone encounter between [name] and [name] on 07/02/2020 for annual check up. The visit was conducted with the patient located at home and [name] at [facility]. The patient's identity was confirmed using their DOB and current address. The patient has consented to being evaluated through a telephone encounter and understands the associated risks (an examination cannot be done and the patient may need to come in for an appointment) / benefits (allows the patient to remain at home, decreasing  exposure to coronavirus). I personally spent 11 minutes on medical discussion.

## 2020-07-02 NOTE — Assessment & Plan Note (Signed)
Received COVID Vaccine on 06/29/20 second injection due at the end of the month.

## 2020-07-23 ENCOUNTER — Other Ambulatory Visit: Payer: Self-pay

## 2020-07-23 ENCOUNTER — Ambulatory Visit (INDEPENDENT_AMBULATORY_CARE_PROVIDER_SITE_OTHER): Payer: Medicare Other

## 2020-07-23 DIAGNOSIS — Z23 Encounter for immunization: Secondary | ICD-10-CM

## 2020-07-23 NOTE — Progress Notes (Signed)
   Covid-19 Vaccination Clinic  Name:  Emmanuel Gruenhagen    MRN: [id number] DOB: 1960-10-31  07/23/2020  Mr. Mounce was observed post Covid-19 immunization for 30 minutes based on pre-vaccination screening without incident. He was provided with Vaccine Information Sheet and instruction to access the V-Safe system.   Mr. Racicot was instructed to call 911 with any severe reactions post vaccine: Marland Kitchen Difficulty breathing  . Swelling of face and throat  . A fast heartbeat  . A bad rash all over body  . Dizziness and weakness   Immunizations Administered    Name Date Dose VIS Date Route   Pfizer COVID-19 Vaccine 07/23/2020 10:01 AM 0.3 mL 12/24/2018 Intramuscular   Manufacturer: [place]   Lot: [id number]   [place]: [place], RN

## 2020-07-23 NOTE — Progress Notes (Signed)
   Covid-19 Vaccination Clinic  Name:  [name]    MRN: [id number] DOB: 1961/10/03  07/23/2020  [name] was observed post Covid-19 immunization for 30 minutes based on pre-vaccination screening without incident. He was provided with Vaccine Information Sheet and instruction to access the V-Safe system.   [name] was instructed to call 911 with any severe reactions post vaccine: Marland Kitchen Difficulty breathing  . Swelling of face and throat  . A fast heartbeat  . A bad rash all over body  . Dizziness and weakness   Immunizations Administered    Name Date Dose VIS Date Route   Pfizer COVID-19 Vaccine 07/23/2020 10:01 AM 0.3 mL 12/24/2018 Intramuscular   Manufacturer: [place]   Lot: [id number]   [place]: [place]

## 2020-07-27 ENCOUNTER — Other Ambulatory Visit: Payer: Self-pay | Admitting: Internal Medicine

## 2020-07-27 ENCOUNTER — Telehealth: Payer: Self-pay

## 2020-07-27 DIAGNOSIS — G894 Chronic pain syndrome: Secondary | ICD-10-CM

## 2020-07-27 MED ORDER — OXYCODONE HCL 5 MG PO TABS: 5.0000 mg | ORAL_TABLET | Freq: Two times a day (BID) | ORAL | 0 refills | Status: DC | PRN

## 2020-07-27 MED ORDER — MORPHINE SULFATE ER 30 MG PO TBCR
90.0000 mg | EXTENDED_RELEASE_TABLET | Freq: Three times a day (TID) | ORAL | 0 refills | Status: DC
Start: 2020-07-27 — End: 2020-08-26

## 2020-07-27 NOTE — Telephone Encounter (Signed)
Patient is calling requesting a refill on his oxycodone. Last filled 06/28/20. Anya Murphey T Brooks Sailors

## 2020-07-27 NOTE — Telephone Encounter (Signed)
Completed.    Thanks

## 2020-07-27 NOTE — Telephone Encounter (Signed)
Patient would like both medications sent. Thank you

## 2020-07-27 NOTE — Telephone Encounter (Signed)
Hi, Is he requesting his oxycodone and morphine or just the oxy?  Thanks, Mitzi Hansen

## 2020-08-19 ENCOUNTER — Other Ambulatory Visit: Payer: Self-pay | Admitting: Pharmacist

## 2020-08-20 ENCOUNTER — Other Ambulatory Visit: Payer: Self-pay | Admitting: Internal Medicine

## 2020-08-20 NOTE — Telephone Encounter (Signed)
Rx denied.  Patient no longer under providers care.//AB/CMA

## 2020-08-26 ENCOUNTER — Other Ambulatory Visit: Payer: Self-pay | Admitting: Internal Medicine

## 2020-08-26 ENCOUNTER — Telehealth: Payer: Self-pay

## 2020-08-26 DIAGNOSIS — G894 Chronic pain syndrome: Secondary | ICD-10-CM

## 2020-08-26 MED ORDER — MORPHINE SULFATE ER 30 MG PO TBCR
90.0000 mg | EXTENDED_RELEASE_TABLET | Freq: Three times a day (TID) | ORAL | 0 refills | Status: DC
Start: 2020-08-26 — End: 2020-09-22

## 2020-08-26 MED ORDER — OXYCODONE HCL 5 MG PO TABS: 5.0000 mg | ORAL_TABLET | Freq: Two times a day (BID) | ORAL | 0 refills | Status: DC | PRN

## 2020-08-26 NOTE — Telephone Encounter (Signed)
Patient called requesting refills on pain management regimen. Forwarding to provider for approval. Requested refills be sent to [facility].  Jennamarie Goings Lorita Officer, RN

## 2020-08-26 NOTE — Telephone Encounter (Signed)
Done

## 2020-09-22 ENCOUNTER — Other Ambulatory Visit: Payer: Self-pay | Admitting: Internal Medicine

## 2020-09-22 ENCOUNTER — Telehealth: Payer: Self-pay

## 2020-09-22 DIAGNOSIS — G894 Chronic pain syndrome: Secondary | ICD-10-CM

## 2020-09-22 MED ORDER — OXYCODONE HCL 5 MG PO TABS: 5.0000 mg | ORAL_TABLET | Freq: Two times a day (BID) | ORAL | 0 refills | Status: DC | PRN

## 2020-09-22 MED ORDER — MORPHINE SULFATE ER 30 MG PO TBCR
90.0000 mg | EXTENDED_RELEASE_TABLET | Freq: Three times a day (TID) | ORAL | 0 refills | Status: DC
Start: 2020-09-22 — End: 2020-10-25

## 2020-09-22 NOTE — Telephone Encounter (Signed)
Done

## 2020-09-22 NOTE — Telephone Encounter (Signed)
Patient called requesting refills for pain management regimen. Forwarding message to Dr. Megan Salon for approval. [name]

## 2020-09-30 ENCOUNTER — Other Ambulatory Visit: Payer: Self-pay | Admitting: Internal Medicine

## 2020-09-30 DIAGNOSIS — B20 Human immunodeficiency virus [HIV] disease: Secondary | ICD-10-CM

## 2020-10-25 ENCOUNTER — Telehealth: Payer: Self-pay

## 2020-10-25 ENCOUNTER — Other Ambulatory Visit: Payer: Self-pay | Admitting: Internal Medicine

## 2020-10-25 DIAGNOSIS — G894 Chronic pain syndrome: Secondary | ICD-10-CM

## 2020-10-25 MED ORDER — MORPHINE SULFATE ER 30 MG PO TBCR: 90.0000 mg | EXTENDED_RELEASE_TABLET | Freq: Three times a day (TID) | ORAL | 0 refills | Status: DC

## 2020-10-25 MED ORDER — OXYCODONE HCL 5 MG PO TABS: 5.0000 mg | ORAL_TABLET | Freq: Two times a day (BID) | ORAL | 0 refills | Status: DC | PRN

## 2020-10-25 NOTE — Telephone Encounter (Signed)
Patient called requesting refill of oxycodone and morphine. Will route to provider.   Sandie Ano, RN

## 2020-10-25 NOTE — Telephone Encounter (Signed)
Orders sent. Thanks.

## 2020-11-03 ENCOUNTER — Telehealth: Payer: Self-pay | Admitting: Pharmacist

## 2020-11-03 ENCOUNTER — Telehealth: Payer: Self-pay | Admitting: Internal Medicine

## 2020-11-03 ENCOUNTER — Telehealth: Payer: Self-pay

## 2020-11-03 DIAGNOSIS — B20 Human immunodeficiency virus [HIV] disease: Secondary | ICD-10-CM

## 2020-11-03 MED ORDER — TIVICAY 50 MG PO TABS: 50.0000 mg | ORAL_TABLET | Freq: Every day | ORAL | 3 refills | Status: DC

## 2020-11-03 MED ORDER — PREZCOBIX 800-150 MG PO TABS: ORAL_TABLET | ORAL | 3 refills | Status: DC

## 2020-11-03 NOTE — Telephone Encounter (Signed)
[name] returned call from [place].   He states he is out of medications and needs to know what to do next.    After reviewing the note related to his medication I called to speak with Dr Orvan Falconer who gave a verbal order if patient is out of medicine call in [name] and [name], stop the [facility] and follow up next week.    Return call, no answer. I left message to call the office.   Spoke with patient and explained new regimen.  I am sending [name] [name] [place] [facility] as requested by patient   I will add a note to express the shipment since he has been without medications for 2 days. I will also indicate patient is to stop Vemlidy   He was given return date and time for appointment next week to discuss medications.   Matthew Josephs, RN

## 2020-11-03 NOTE — Telephone Encounter (Signed)
Per MD called patient per Md to schedule appt for next Tuesday to discuss medication concerns. Scheduled appointment, and left vm requesting patient call back to confirm.

## 2020-11-03 NOTE — Telephone Encounter (Signed)
-----   Message from Robinette Haines, RPH-CPP sent at 11/03/2020  3:49 PM EST ----- Regarding: Our other friend His notes go back a longggg way, but from what I can tell:  In 2008, he was on Stavudine + Kaletra + [name]  His Kaletra was switched to Atazanavir + ritonavir in May 2013. He continued on Stavudine and [name].  His Stavudine was changed to [name] in Jan 2015, so he was on [name] + [name] + Atazanavir + Ritonavir  In July 2015, his Atazanavir + ritonavir was combined to [employer]. So he was on Evotaz + [name] + [name].  In January 2017, he was switched to [name] to avoid a drug interaction with his PPIs, so he was taking [name] + [name] + [name].  In February 2018, his [name] was switched to [facility] for kidney health. This is where we stand today with Vemlidy + [name] + [name].  I found a genotype from July 2002 and it isn't pretty. He has extensive NRTI and NNRTI resistance (note in epic).   Nucleoside RTIs Abacavir - intermediate resistance Zidovudine - high level resistance Emtricitabine - high level resistance Lamivudine - high level resistance Tenofovir - potential low lever resistance  Non-Nucleoside RTIs Doravirine - high level resistance Efavirenz - high level resistance Etravirine - intermediate resistance Nevirapine - high level resistance Rilpivirine - high level resistance  I couldn't find any PI or integrase resistance. He is probably only getting benefit from [name] and [name] and it seems to be working since he has been undetectable for years. So we can maybe just stop the [facility] all together and continue the [name] and [name] alone. Or we can switch to Biktarvy + [name] in case he is getting any benefit from the tenofovir. Another option would be Symtuza + [name]. Thoughts?

## 2020-11-03 NOTE — Addendum Note (Signed)
Addended by: Laurell Josephs on: 11/03/2020 05:25 PM   Modules accepted: Orders

## 2020-11-03 NOTE — Telephone Encounter (Signed)
Cumulative HIV Genotype Data  Genotype Dates: 05/24/2001  RT Mutations  K70R, M184V, T215Y, Y181C, G190S  PI Mutations  D30N  Integrase Mutations  None   Interpretation of Genotype Data per [employer] HIV Drug Resistance Database:  Nucleoside RTIs  Abacavir - intermediate resistance Zidovudine - high level resistance Emtricitabine - high level resistance Lamivudine - high level resistance Tenofovir - potential low lever resistance   Non-Nucleoside RTIs  Doravirine - high level resistance Efavirenz - high level resistance Etravirine - intermediate resistance Nevirapine - high level resistance Rilpivirine - high level resistance   Protease Inhibitors  Atazanavir - susceptible Darunavir - susceptible Lopinavir - susceptible   Integrase Inhibitors  Bictegravir - susceptible Dolutegravir - susceptible Elvitegravir - susceptible Raltegravir - susceptible   Tex Conroy L. Milany Geck, PharmD, BCIDP, AAHIVP, CPP Clinical Pharmacist Practitioner Infectious Diseases Clinical Pharmacist [facility] 11/03/2020, 3:59 PM

## 2020-11-03 NOTE — Telephone Encounter (Signed)
[facility] stating insurance is no longer cover Brand Vemilidy. Pharmacy requesting alternative medication Entecavir or Lamivudine. Patient is close to running out of current medication. Routing to provider for advise. Valarie Cones

## 2020-11-04 NOTE — Telephone Encounter (Signed)
[name],   Thanks. I will have him come next week to discuss a regimen change. I favor changing him to [facility] plus [name].   [name]

## 2020-11-04 NOTE — Telephone Encounter (Signed)
[name] has been instructed to stop Vemlidy now.  He will continue [name] and [name].  He will follow-up with me and I will probably switch him to [facility] plus [name].

## 2020-11-09 ENCOUNTER — Other Ambulatory Visit: Payer: Self-pay

## 2020-11-09 ENCOUNTER — Ambulatory Visit (INDEPENDENT_AMBULATORY_CARE_PROVIDER_SITE_OTHER): Payer: Medicare Other | Admitting: Internal Medicine

## 2020-11-09 ENCOUNTER — Encounter: Payer: Self-pay | Admitting: Internal Medicine

## 2020-11-09 VITALS — BP 137/71 | HR 103 | Temp 99.2°F

## 2020-11-09 DIAGNOSIS — F17210 Nicotine dependence, cigarettes, uncomplicated: Secondary | ICD-10-CM | POA: Diagnosis not present

## 2020-11-09 DIAGNOSIS — G546 Phantom limb syndrome with pain: Secondary | ICD-10-CM | POA: Diagnosis not present

## 2020-11-09 DIAGNOSIS — F17200 Nicotine dependence, unspecified, uncomplicated: Secondary | ICD-10-CM

## 2020-11-09 DIAGNOSIS — B20 Human immunodeficiency virus [HIV] disease: Secondary | ICD-10-CM

## 2020-11-09 DIAGNOSIS — Z23 Encounter for immunization: Secondary | ICD-10-CM

## 2020-11-09 MED ORDER — BICTEGRAVIR-EMTRICITAB-TENOFOV 50-200-25 MG PO TABS: 1.0000 | ORAL_TABLET | Freq: Every day | ORAL | 11 refills | Status: DC

## 2020-11-09 MED ORDER — PREZCOBIX 800-150 MG PO TABS: ORAL_TABLET | ORAL | 11 refills | Status: DC

## 2020-11-09 NOTE — Progress Notes (Signed)
Patient Active Problem List   Diagnosis Date Noted  . Phantom limb syndrome with pain ([place]) 03/21/2012    Priority: High  . Long term current use of anticoagulant 11/20/2010    Priority: High  . History of DVT (deep vein thrombosis) 07/19/2009    Priority: High  . Human immunodeficiency virus (HIV) disease ([place]) 11/10/2006    Priority: High  . Hyperlipemia 11/10/2006    Priority: High  . CIGARETTE SMOKER 11/10/2006    Priority: High  . Polycythemia 07/02/2020  . Uses powered wheelchair 09/19/2019  . Prosthesis adjustment 07/20/2017  . Long term current use of opiate analgesic 12/17/2015  . Pressure ulcer 09/13/2015  . Protein-calorie malnutrition, severe 09/13/2015  . Avascular necrosis of femoral head ([place]) 05/14/2015  . Left adrenal mass ([place]) 05/14/2015  . Preventative health care 03/09/2013  . ERECTILE DYSFUNCTION, ORGANIC 04/12/2010  . GERD 08/23/2009  . HYPOGONADISM 07/15/2009  . DEFICIENCY OF OTHER VITAMINS 07/15/2009  . Pulmonary nodule 07/15/2009  . Status post above knee amputation ([place]) 07/15/2009  . SHINGLES, RECURRENT 11/10/2006  . Alcohol abuse 11/10/2006  . PERIPHERAL NEUROPATHY 11/10/2006  . ALLERGIC RHINITIS 11/10/2006    Patient's Medications  New Prescriptions   BICTEGRAVIR-EMTRICITABINE-TENOFOVIR AF (BIKTARVY) 50-200-25 MG TABS TABLET    Take 1 tablet by mouth daily.  Previous Medications   ATORVASTATIN (LIPITOR) 20 MG TABLET    Take 1 tablet (20 mg total) by mouth daily.   ENSURE (ENSURE)    Take 1 Can by mouth 4 (four) times daily - after meals and at bedtime.   GABAPENTIN (NEURONTIN) 400 MG CAPSULE    TAKE 1 CAPSULE(400 MG) BY MOUTH FOUR TIMES DAILY   MORPHINE (MS CONTIN) 30 MG 12 HR TABLET    Take 3 tablets (90 mg total) by mouth 3 (three) times daily.   OXYCODONE (OXY IR/ROXICODONE) 5 MG IMMEDIATE RELEASE TABLET    Take 1 tablet (5 mg total) by mouth 2 (two) times daily as needed for severe pain.   PANTOPRAZOLE (PROTONIX) 40 MG  TABLET    TAKE 1 TABLET(40 MG) BY MOUTH DAILY   WARFARIN (COUMADIN) 5 MG TABLET    TAKE 1& 1/2 TABLETS BY MOUTH ON SUNDAY, TUESDAY, THURSDAY, AND SATURDAY. TAKE 1 TABLET DAILY ON ALL OTHER DAYS  Modified Medications   Modified Medication Previous Medication   DARUNAVIR-COBICISTAT ([name]) 800-150 MG TABLET darunavir-cobicistat ([name]) 800-150 MG tablet      TAKE 1 TABLET BY MOUTH EVERY DAY DO NOT CRUSH BREAK OR CHEW TABLETS TAKE WITH FOOD    TAKE 1 TABLET BY MOUTH EVERY DAY DO NOT CRUSH BREAK OR CHEW TABLETS TAKE WITH FOOD  Discontinued Medications   DOLUTEGRAVIR ([name]) 50 MG TABLET    Take 1 tablet (50 mg total) by mouth daily.   OLOPATADINE (PATANOL) 0.1 % OPHTHALMIC SOLUTION    Place 1 drop into both eyes 2 (two) times daily.   VEMLIDY 25 MG TABS    TAKE 1 TABLET (25 MG TOTAL) BY MOUTH DAILY.    Subjective: [name] is in for his routine HIV follow-up visit.  He recently had difficulty getting his Vemlidy due to needing prior authorization in the new year.  He has not had any problems getting [name] or [name].  He is only missed 1-2 doses of this medication in the past year, when his pharmacy was late filling his prescriptions.  He received his second [place] in September.  He is feeling well.  He  is still smoking cigarettes.  His chronic pain is well controlled with his current pain regimen.  His pain is minimal when he is wearing his prosthesis but gets much work when he takes his prosthesis off.  He denies feeling anxious or depressed.  He says he signed up for cable TV during the COVID pandemic and watches lots of TV.  Review of Systems: Review of Systems  Constitutional: Negative for chills, diaphoresis, fever, malaise/fatigue and weight loss.  HENT: Negative for sore throat.   Respiratory: Negative for cough, sputum production and shortness of breath.   Cardiovascular: Negative for chest pain.  Gastrointestinal: Negative for abdominal pain, diarrhea, heartburn,  nausea and vomiting.  Genitourinary: Negative for dysuria and frequency.  Musculoskeletal: Positive for joint pain. Negative for myalgias.  Skin: Negative for rash.  Neurological: Negative for dizziness and headaches.  Psychiatric/Behavioral: Negative for depression and substance abuse. The patient is not nervous/anxious.     Past Medical History:  Diagnosis Date  . Chronic kidney disease   . History of chicken pox   . History of DVT of lower extremity    right  . HIV (human immunodeficiency virus infection) ([place])   . Hyperlipidemia   . Left hip pain 03/09/2013  . Pneumonia   . Protein malnutrition ([place]) 08/2015    Social History   Tobacco Use  . Smoking status: Current Every Day Smoker    Packs/day: 0.75    Years: 36.00    Pack years: 27.00    Types: Cigarettes  . Smokeless tobacco: Never Used  . Tobacco comment: 1 pk a day   Vaping Use  . Vaping Use: Never used  Substance Use Topics  . Alcohol use: No    Alcohol/week: 0.0 standard drinks  . Drug use: Yes    Frequency: 2.0 times per week    Types: Marijuana    Family History  Problem Relation Age of Onset  . Arthritis Mother   . Kidney disease Maternal Grandfather     Allergies  Allergen Reactions  . Dapsone     REACTION: fever  . Megestrol   . Sulfa Antibiotics Other (See Comments)    "put's me down bad"  . Sulfamethoxazole-Trimethoprim     REACTION: fever    Health Maintenance  Topic Date Due  . COLONOSCOPY (Pts 45-5yrs Insurance coverage will need to be confirmed)  Never done  . COLON CANCER SCREENING ANNUAL FOBT  12/20/2018  . INFLUENZA VACCINE  05/30/2020  . COVID-19 Vaccine (3 - Pfizer risk 4-dose series) 08/20/2020  . TETANUS/TDAP  10/08/2024  . Hepatitis C Screening  Completed  . HIV Screening  Completed    Objective:  Vitals:   11/09/20 1400  BP: 137/71  Pulse: (!) 103  Temp: 99.2 F (37.3 C)  SpO2: 95%   There is no height or weight on file to calculate BMI.  Physical  Exam Constitutional:      Comments: He is in good spirits.  He is seated in his wheelchair.  Cardiovascular:     Rate and Rhythm: Normal rate and regular rhythm.     Heart sounds: No murmur heard.   Pulmonary:     Effort: Pulmonary effort is normal.     Breath sounds: Normal breath sounds.  Abdominal:     Palpations: Abdomen is soft.     Tenderness: There is no abdominal tenderness.  Musculoskeletal:        General: No swelling or tenderness.  Skin:    Findings: No rash.  Psychiatric:        Mood and Affect: Mood normal.     Lab Results Lab Results  Component Value Date   WBC 7.3 11/19/2019   HGB 19.4 (H) 11/19/2019   HCT 56.3 (H) 11/19/2019   MCV 92.8 11/19/2019   PLT 261 11/19/2019    Lab Results  Component Value Date   CREATININE 1.35 (H) 11/19/2019   BUN 11 11/19/2019   NA 139 11/19/2019   K 4.2 11/19/2019   CL 97 (L) 11/19/2019   CO2 33 (H) 11/19/2019    Lab Results  Component Value Date   ALT 14 11/19/2019   AST 19 11/19/2019   ALKPHOS 408 (H) 05/22/2017   BILITOT 0.5 11/19/2019    Lab Results  Component Value Date   CHOL 213 (H) 11/19/2019   HDL 36 (L) 11/19/2019   LDLCALC 135 (H) 11/19/2019   TRIG 275 (H) 11/19/2019   CHOLHDL 5.9 (H) 11/19/2019   Lab Results  Component Value Date   LABRPR NON-REACTIVE 11/19/2019   HIV 1 RNA Quant  Date Value  06/14/2020 <20 Copies/mL  11/19/2019 <20 NOT DETECTED copies/mL  05/22/2019 <20 NOT DETECTED copies/mL   CD4 T Cell Abs (/uL)  Date Value  06/14/2020 734  11/19/2019 765  05/22/2019 612     Problem List Items Addressed This Visit      High   Human immunodeficiency virus (HIV) disease ([place])    His infection remains under excellent, long-term control.  I will consolidate and simplify his antiretroviral regimen to Biktarvy plus [name].  He received his influenza vaccine today.  I asked him to mark his calendar to remind him to get his COVID booster in early April.  He will follow-up after lab  work in 6 months.      Relevant Medications   darunavir-cobicistat ([name]) 800-150 MG tablet   bictegravir-emtricitabine-tenofovir AF (BIKTARVY) 50-200-25 MG TABS tablet   Other Relevant Orders   T-helper cell (CD4)- ([facility] clinic only)   HIV-1 RNA quant-no reflex-bld   CBC   Comprehensive metabolic panel   RPR   CIGARETTE SMOKER    He is currently not ready to consider quitting cigarettes.      Phantom limb syndrome with pain (HCC)    His chronic phantom limb pain is under good control with his current pain regimen.           Michel Bickers, MD [facility] for [facility] [place] [facility] [phone] pager   [phone] cell 11/09/2020, 2:17 PM

## 2020-11-09 NOTE — Assessment & Plan Note (Signed)
His chronic phantom limb pain is under good control with his current pain regimen.

## 2020-11-09 NOTE — Assessment & Plan Note (Signed)
He is currently not ready to consider quitting cigarettes.

## 2020-11-09 NOTE — Assessment & Plan Note (Signed)
His infection remains under excellent, long-term control.  I will consolidate and simplify his antiretroviral regimen to Biktarvy plus [name].  He received his influenza vaccine today.  I asked him to mark his calendar to remind him to get his COVID booster in early April.  He will follow-up after lab work in 6 months.

## 2020-11-24 ENCOUNTER — Telehealth: Payer: Self-pay

## 2020-11-24 ENCOUNTER — Other Ambulatory Visit: Payer: Self-pay | Admitting: Internal Medicine

## 2020-11-24 DIAGNOSIS — G894 Chronic pain syndrome: Secondary | ICD-10-CM

## 2020-11-24 MED ORDER — OXYCODONE HCL 5 MG PO TABS: 5.0000 mg | ORAL_TABLET | Freq: Two times a day (BID) | ORAL | 0 refills | Status: DC | PRN

## 2020-11-24 MED ORDER — MORPHINE SULFATE ER 30 MG PO TBCR: 90.0000 mg | EXTENDED_RELEASE_TABLET | Freq: Three times a day (TID) | ORAL | 0 refills | Status: DC

## 2020-11-24 NOTE — Telephone Encounter (Signed)
Patient is calling requesting a refill on his oxycodone and Rx should be sent to [facility] on [address].  [name]

## 2020-11-24 NOTE — Telephone Encounter (Signed)
Refill requests sent.  Thanks. 

## 2020-11-30 ENCOUNTER — Other Ambulatory Visit: Payer: Self-pay | Admitting: Internal Medicine

## 2020-11-30 DIAGNOSIS — G629 Polyneuropathy, unspecified: Secondary | ICD-10-CM

## 2020-12-23 ENCOUNTER — Telehealth: Payer: Self-pay

## 2020-12-23 ENCOUNTER — Other Ambulatory Visit: Payer: Self-pay | Admitting: Internal Medicine

## 2020-12-23 DIAGNOSIS — G894 Chronic pain syndrome: Secondary | ICD-10-CM

## 2020-12-23 MED ORDER — MORPHINE SULFATE ER 30 MG PO TBCR: 90.0000 mg | EXTENDED_RELEASE_TABLET | Freq: Three times a day (TID) | ORAL | 0 refills | Status: DC

## 2020-12-23 MED ORDER — OXYCODONE HCL 5 MG PO TABS: 5.0000 mg | ORAL_TABLET | Freq: Two times a day (BID) | ORAL | 0 refills | Status: DC | PRN

## 2020-12-23 NOTE — Telephone Encounter (Signed)
Refill requests sent.  Thanks.

## 2020-12-23 NOTE — Telephone Encounter (Signed)
Patient PA for Morphine Sulf 30 mg tablet has been approved until 10/29/21.  [name]

## 2020-12-23 NOTE — Telephone Encounter (Signed)
Patient calling requesting refill on his morphine and oxycodone. Medications will need to be sent to [facility].

## 2021-01-12 ENCOUNTER — Encounter: Payer: Self-pay | Admitting: *Deleted

## 2021-01-12 ENCOUNTER — Encounter: Payer: Self-pay | Admitting: Internal Medicine

## 2021-01-12 NOTE — Progress Notes (Unsigned)
Things That May Be Affecting Your Health:  Alcohol  Hearing loss  Pain    Depression x Home Safety  Sexual Health   Diabetes  Lack of physical activity  Stress   Difficulty with daily activities  Loneliness  Tiredness   Drug use  Medicines  Tobacco use   Falls  Motor Vehicle Safety  Weight   Food choices  Oral Health  Other    YOUR PERSONALIZED HEALTH PLAN : 1. Schedule your next subsequent Medicare Wellness visit in one year 2. Attend all of your regular appointments to address your medical issues 3. Complete the preventative screenings and services   Annual Wellness Visit   Medicare Covered Preventative Screenings and [place] Men and Women Who How Often Need? Date of Last Service Action  Abdominal Aortic Aneurysm Adults with AAA risk factors Once x     Alcohol Misuse and Counseling All Adults Screening once a year if no alcohol misuse. Counseling up to 4 face to face sessions.     Bone Density Measurement  Adults at risk for osteoporosis Once every 2 yrs      Lipid Panel Z13.6 All adults without CV disease Once every 5 yrs       Colorectal Cancer   Stool sample or  Colonoscopy All adults 66 and older   Once every year  Every 10 years x       Depression All Adults Once a year x Today   Diabetes Screening Blood glucose, post glucose load, or GTT Z13.1  All adults at risk  Pre-diabetics  Once per year  Twice per year      Diabetes  Self-Management Training All adults Diabetics 10 hrs first year; 2 hours subsequent years. Requires Copay     Glaucoma  Diabetics  Family history of glaucoma  African Americans 91 yrs +  Hispanic Americans 31 yrs + Annually - requires coppay      Hepatitis C Z72.89 or F19.20  High Risk for HCV  Born between 1945 and 1965  Annually  Once      HIV Z11.4 All adults based on risk  Annually btw ages 65 & 51 regardless of risk  Annually > 65 yrs if at increased risk      Lung Cancer Screening  Asymptomatic adults aged 22-77 with 30 pack yr history and current smoker OR quit within the last 15 yrs Annually Must have counseling and shared decision making documentation before first screen x     Medical Nutrition Therapy Adults with   Diabetes  Renal disease  Kidney transplant within past 3 yrs 3 hours first year; 2 hours subsequent years     Obesity and Counseling All adults Screening once a year Counseling if BMI 30 or higher  Today   Tobacco Use Counseling Adults who use tobacco  Up to 8 visits in one year x    Vaccines Z23  Hepatitis B  Influenza   Pneumonia  Adults   Once  Once every flu season  Two different vaccines separated by one year     Next Annual Wellness Visit People with Medicare Every year  Today     Services & Screenings Women Who How Often Need  Date of Last Service Action  Mammogram  Z12.31 Women over 24 One baseline ages 59-39. Annually ager 40 yrs+      Pap tests All women Annually if high risk. Every 2 yrs for normal risk women  Screening for cervical cancer with   Pap (Z01.419 nl or Z01.411abnl) &  HPV Z11.51 Women aged 71 to 19 Once every 5 yrs     Screening pelvic and breast exams All women Annually if high risk. Every 2 yrs for normal risk women     Sexually Transmitted Diseases  Chlamydia  Gonorrhea  Syphilis All at risk adults Annually for non pregnant females at increased risk         [place] Men Who How Ofter Need  Date of Last Service Action  Prostate Cancer - DRE & PSA Men over 50 Annually.  DRE might require a copay.        Sexually Transmitted Diseases  Syphilis All at risk adults Annually for men at increased risk      Health Maintenance List Health Maintenance  Topic Date Due  . COLONOSCOPY (Pts 45-45yrs Insurance coverage will need to be confirmed)  Never done  . COLON CANCER SCREENING ANNUAL FOBT  12/20/2018  . COVID-19 Vaccine (3 - Pfizer risk 4-dose series) 08/20/2020  .  TETANUS/TDAP  10/08/2024  . INFLUENZA VACCINE  Completed  . Hepatitis C Screening  Completed  . HIV Screening  Completed  . HPV VACCINES  Aged Out

## 2021-01-12 NOTE — Progress Notes (Unsigned)

## 2021-01-21 ENCOUNTER — Telehealth: Payer: Self-pay

## 2021-01-21 ENCOUNTER — Other Ambulatory Visit: Payer: Self-pay | Admitting: Internal Medicine

## 2021-01-21 DIAGNOSIS — G894 Chronic pain syndrome: Secondary | ICD-10-CM

## 2021-01-21 MED ORDER — OXYCODONE HCL 5 MG PO TABS: 5.0000 mg | ORAL_TABLET | Freq: Two times a day (BID) | ORAL | 0 refills | Status: DC | PRN

## 2021-01-21 MED ORDER — MORPHINE SULFATE ER 30 MG PO TBCR: 90.0000 mg | EXTENDED_RELEASE_TABLET | Freq: Three times a day (TID) | ORAL | 0 refills | Status: DC

## 2021-01-21 NOTE — Telephone Encounter (Signed)
Refills sent

## 2021-01-21 NOTE — Telephone Encounter (Signed)
Patient requesting refills on controlled prescriptions. Would like Morphine 30 mg and Oxycodone [facility] Patient states he will be out of Oxycodone this Saturday.  Would like refill sent to [facility] on [address]. Will forward message to provider.

## 2021-02-18 ENCOUNTER — Other Ambulatory Visit: Payer: Self-pay | Admitting: Internal Medicine

## 2021-02-21 ENCOUNTER — Ambulatory Visit: Payer: Medicare Other

## 2021-02-22 ENCOUNTER — Other Ambulatory Visit: Payer: Self-pay | Admitting: Internal Medicine

## 2021-02-22 ENCOUNTER — Telehealth: Payer: Self-pay

## 2021-02-22 DIAGNOSIS — G894 Chronic pain syndrome: Secondary | ICD-10-CM

## 2021-02-22 MED ORDER — MORPHINE SULFATE ER 30 MG PO TBCR: 90.0000 mg | EXTENDED_RELEASE_TABLET | Freq: Three times a day (TID) | ORAL | 0 refills | Status: DC

## 2021-02-22 MED ORDER — OXYCODONE HCL 5 MG PO TABS: 5.0000 mg | ORAL_TABLET | Freq: Two times a day (BID) | ORAL | 0 refills | Status: DC | PRN

## 2021-02-22 NOTE — Telephone Encounter (Signed)
Refill requests sent

## 2021-02-22 NOTE — Telephone Encounter (Signed)
Patient calling requesting refills on morphine and oxycodone to be sent to [facility]  on [address].

## 2021-03-23 ENCOUNTER — Other Ambulatory Visit: Payer: Self-pay | Admitting: Internal Medicine

## 2021-03-23 ENCOUNTER — Telehealth: Payer: Self-pay

## 2021-03-23 DIAGNOSIS — G894 Chronic pain syndrome: Secondary | ICD-10-CM

## 2021-03-23 MED ORDER — MORPHINE SULFATE ER 30 MG PO TBCR: 90.0000 mg | EXTENDED_RELEASE_TABLET | Freq: Three times a day (TID) | ORAL | 0 refills | Status: DC

## 2021-03-23 MED ORDER — OXYCODONE HCL 5 MG PO TABS: 5.0000 mg | ORAL_TABLET | Freq: Two times a day (BID) | ORAL | 0 refills | Status: DC | PRN

## 2021-03-23 NOTE — Telephone Encounter (Signed)
Refill requests sent

## 2021-03-23 NOTE — Telephone Encounter (Signed)
Patient called requesting refills of oxycodone and morphine be sent to [facility] on [address].   [name], RN

## 2021-04-20 ENCOUNTER — Other Ambulatory Visit: Payer: Self-pay | Admitting: Internal Medicine

## 2021-04-20 ENCOUNTER — Telehealth: Payer: Self-pay

## 2021-04-20 DIAGNOSIS — G894 Chronic pain syndrome: Secondary | ICD-10-CM

## 2021-04-20 MED ORDER — MORPHINE SULFATE ER 30 MG PO TBCR: 90.0000 mg | EXTENDED_RELEASE_TABLET | Freq: Three times a day (TID) | ORAL | 0 refills | Status: DC

## 2021-04-20 MED ORDER — OXYCODONE HCL 5 MG PO TABS: 5.0000 mg | ORAL_TABLET | Freq: Two times a day (BID) | ORAL | 0 refills | Status: DC | PRN

## 2021-04-20 NOTE — Telephone Encounter (Signed)
Patient called for medication refill on Both Oxycodone 5mg  and Morphine 30mg  to [facility] on [address]. Needs it by [place] be out one Saturday. If you need to talk to patient best contact number is [phone]

## 2021-04-27 ENCOUNTER — Other Ambulatory Visit: Payer: Self-pay | Admitting: Family

## 2021-04-27 DIAGNOSIS — B20 Human immunodeficiency virus [HIV] disease: Secondary | ICD-10-CM

## 2021-05-03 ENCOUNTER — Other Ambulatory Visit: Payer: Self-pay

## 2021-05-03 ENCOUNTER — Other Ambulatory Visit: Payer: Medicare Other

## 2021-05-03 DIAGNOSIS — B20 Human immunodeficiency virus [HIV] disease: Secondary | ICD-10-CM | POA: Diagnosis not present

## 2021-05-04 ENCOUNTER — Other Ambulatory Visit: Payer: Self-pay | Admitting: Internal Medicine

## 2021-05-04 DIAGNOSIS — B20 Human immunodeficiency virus [HIV] disease: Secondary | ICD-10-CM

## 2021-05-04 LAB — T-HELPER CELL (CD4) - (RCID CLINIC ONLY)
CD4 % Helper T Cell: 20 % — ABNORMAL LOW (ref 33–65)
CD4 T Cell Abs: 516 /uL (ref 400–1790)

## 2021-05-05 ENCOUNTER — Telehealth: Payer: Self-pay

## 2021-05-05 LAB — COMPREHENSIVE METABOLIC PANEL
AG Ratio: 1 (calc) (ref 1.0–2.5)
ALT: 10 U/L (ref 9–46)
AST: 17 U/L (ref 10–35)
Albumin: 3.8 g/dL (ref 3.6–5.1)
Alkaline phosphatase (APISO): 66 U/L (ref 35–144)
BUN/Creatinine Ratio: 5 (calc) — ABNORMAL LOW (ref 6–22)
BUN: 7 mg/dL (ref 7–25)
CO2: 27 mmol/L (ref 20–32)
Calcium: 9.7 mg/dL (ref 8.6–10.3)
Chloride: 99 mmol/L (ref 98–110)
Creat: 1.3 mg/dL — ABNORMAL HIGH (ref 0.70–1.25)
Globulin: 3.8 g/dL (calc) — ABNORMAL HIGH (ref 1.9–3.7)
Glucose, Bld: 142 mg/dL — ABNORMAL HIGH (ref 65–99)
Potassium: 3.6 mmol/L (ref 3.5–5.3)
Sodium: 139 mmol/L (ref 135–146)
Total Bilirubin: 0.7 mg/dL (ref 0.2–1.2)
Total Protein: 7.6 g/dL (ref 6.1–8.1)

## 2021-05-05 LAB — HIV-1 RNA QUANT-NO REFLEX-BLD
HIV 1 RNA Quant: NOT DETECTED Copies/mL
HIV-1 RNA Quant, Log: NOT DETECTED Log cps/mL

## 2021-05-05 LAB — CBC

## 2021-05-05 LAB — RPR: RPR Ser Ql: NONREACTIVE

## 2021-05-05 NOTE — Telephone Encounter (Signed)
Per Dr. Shaheen Star Salon, no need for repeat blood draw. Called patient to let him know, no answer. Left HIPAA compliant voicemail requesting callback.   [name], RN

## 2021-05-05 NOTE — Telephone Encounter (Signed)
Lab staff notified RN that CBC was unable to be processed by the lab. [name] states she spoke with the patient and notified him that he will need to come back in for repeat labs. Per [name], patient states he is having transportation issues and will come in for repeat CBC as soon as he can.   [name], RN

## 2021-05-18 ENCOUNTER — Encounter: Payer: Self-pay | Admitting: Internal Medicine

## 2021-05-18 ENCOUNTER — Other Ambulatory Visit: Payer: Self-pay

## 2021-05-18 ENCOUNTER — Ambulatory Visit (INDEPENDENT_AMBULATORY_CARE_PROVIDER_SITE_OTHER): Payer: Medicare Other | Admitting: Internal Medicine

## 2021-05-18 DIAGNOSIS — G546 Phantom limb syndrome with pain: Secondary | ICD-10-CM | POA: Diagnosis not present

## 2021-05-18 DIAGNOSIS — B20 Human immunodeficiency virus [HIV] disease: Secondary | ICD-10-CM | POA: Diagnosis not present

## 2021-05-18 DIAGNOSIS — Z7901 Long term (current) use of anticoagulants: Secondary | ICD-10-CM

## 2021-05-18 MED ORDER — BICTEGRAVIR-EMTRICITAB-TENOFOV 50-200-25 MG PO TABS: 1.0000 | ORAL_TABLET | Freq: Every day | ORAL | 11 refills | Status: DC

## 2021-05-18 MED ORDER — PREZCOBIX 800-150 MG PO TABS: ORAL_TABLET | ORAL | 11 refills | Status: DC

## 2021-05-18 NOTE — Assessment & Plan Note (Signed)
His phantom limb pain is under good control with his chronic pain regimen.

## 2021-05-18 NOTE — Assessment & Plan Note (Signed)
I asked him to make an appointment in Coumadin clinic ASAP.

## 2021-05-18 NOTE — Assessment & Plan Note (Signed)
His infection remains under excellent, long-term control.  He will continue Biktarvy and [name] and follow-up after lab work in 1 year.

## 2021-05-18 NOTE — Progress Notes (Signed)
Patient Active Problem List   Diagnosis Date Noted   Phantom limb syndrome with pain ([place]) 03/21/2012    Priority: High   Long term current use of anticoagulant 11/20/2010    Priority: High   History of DVT (deep vein thrombosis) 07/19/2009    Priority: High   Human immunodeficiency virus (HIV) disease ([place]) 11/10/2006    Priority: High   Hyperlipemia 11/10/2006    Priority: High   CIGARETTE SMOKER 11/10/2006    Priority: High   Polycythemia 07/02/2020   Uses powered wheelchair 09/19/2019   Prosthesis adjustment 07/20/2017   Long term current use of opiate analgesic 12/17/2015   Pressure ulcer 09/13/2015   Protein-calorie malnutrition, severe 09/13/2015   Avascular necrosis of femoral head ([place]) 05/14/2015   Left adrenal mass ([place]) 05/14/2015   Preventative health care 03/09/2013   ERECTILE DYSFUNCTION, ORGANIC 04/12/2010   GERD 08/23/2009   HYPOGONADISM 07/15/2009   DEFICIENCY OF OTHER VITAMINS 07/15/2009   Pulmonary nodule 07/15/2009   Status post above knee amputation ([place]) 07/15/2009   SHINGLES, RECURRENT 11/10/2006   Alcohol abuse 11/10/2006   PERIPHERAL NEUROPATHY 11/10/2006   ALLERGIC RHINITIS 11/10/2006    Patient's Medications  New Prescriptions   No medications on file  Previous Medications   ATORVASTATIN (LIPITOR) 20 MG TABLET    Take 1 tablet (20 mg total) by mouth daily.   ENSURE (ENSURE)    Take 1 Can by mouth 4 (four) times daily - after meals and at bedtime.   GABAPENTIN (NEURONTIN) 400 MG CAPSULE    TAKE 1 CAPSULE(400 MG) BY MOUTH FOUR TIMES DAILY   MORPHINE (MS CONTIN) 30 MG 12 HR TABLET    Take 3 tablets (90 mg total) by mouth 3 (three) times daily.   OXYCODONE (OXY IR/ROXICODONE) 5 MG IMMEDIATE RELEASE TABLET    Take 1 tablet (5 mg total) by mouth 2 (two) times daily as needed for severe pain.   PANTOPRAZOLE (PROTONIX) 40 MG TABLET    TAKE 1 TABLET(40 MG) BY MOUTH DAILY   WARFARIN (COUMADIN) 5 MG TABLET    TAKE 1 AND 1/2 TABLET BY MOUTH  ON SUNDAY, TUESDAY, THURSDAY AND SATURDAY AND TAKE 1 TABLET BY MOUTH ON ALL OTHER DAYS  Modified Medications   Modified Medication Previous Medication   BICTEGRAVIR-EMTRICITABINE-TENOFOVIR AF (BIKTARVY) 50-200-25 MG TABS TABLET bictegravir-emtricitabine-tenofovir AF (BIKTARVY) 50-200-25 MG TABS tablet      Take 1 tablet by mouth daily.    Take 1 tablet by mouth daily.   DARUNAVIR-COBICISTAT ([name]) 800-150 MG TABLET darunavir-cobicistat ([name]) 800-150 MG tablet      TAKE 1 TABLET BY MOUTH EVERY DAY DO NOT CRUSH BREAK OR CHEW TABLETS TAKE WITH FOOD    TAKE 1 TABLET BY MOUTH EVERY DAY DO NOT CRUSH BREAK OR CHEW TABLETS TAKE WITH FOOD  Discontinued Medications   No medications on file    Subjective: [name] is in for his routine HIV follow-up visit.  He denies any problems obtaining, taking or tolerating his Biktarvy or [name] and does not recall missing doses.  He is still bothered by phantom limb pain following his remote right BKA but states that his pain actually seems to be a little bit better recently.  He says that he still gets very sharp pains when he takes his prosthesis off.  He has not been back to the internal medicine clinic in several months for monitoring of his Coumadin therapy.  Review of Systems: Review of Systems  Constitutional:  Negative for fever and weight loss.  Respiratory:  Negative for cough.   Cardiovascular:  Negative for chest pain.  Gastrointestinal:  Negative for abdominal pain, diarrhea, nausea and vomiting.  Genitourinary:  Negative for dysuria, flank pain, frequency and hematuria.       He tells me that he will on occasion have a little sharp pain at the very end of urination.  He says that he does not have any burning pain during urination.  He has not had any gross hematuria or other change in his urine.  He says that he notes sharp pain when he has to pass a large volume of urine.  Psychiatric/Behavioral:  Negative for depression.    Past Medical  History:  Diagnosis Date   Chronic kidney disease    History of chicken pox    History of DVT of lower extremity    right   HIV (human immunodeficiency virus infection) ([place])    Hyperlipidemia    Left hip pain 03/09/2013   Pneumonia    Protein malnutrition ([place]) 08/2015    Social History   Tobacco Use   Smoking status: Every Day    Packs/day: 0.75    Years: 36.00    Pack years: 27.00    Types: Cigarettes   Smokeless tobacco: Never   Tobacco comments:    1 pk a day   Vaping Use   Vaping Use: Never used  Substance Use Topics   Alcohol use: No    Alcohol/week: 0.0 standard drinks   Drug use: Yes    Frequency: 2.0 times per week    Types: Marijuana    Family History  Problem Relation Age of Onset   Arthritis Mother    Kidney disease Maternal Grandfather     Allergies  Allergen Reactions   Dapsone     REACTION: fever   Megestrol    Sulfa Antibiotics Other (See Comments)    "put's me down bad"   Sulfamethoxazole-Trimethoprim     REACTION: fever    Health Maintenance  Topic Date Due   Zoster Vaccines- Shingrix (1 of 2) Never done   COLONOSCOPY (Pts 45-31yrs Insurance coverage will need to be confirmed)  Never done   Pneumococcal Vaccine 63-4 Years old (3 - PCV) 04/21/2009   COLON CANCER SCREENING ANNUAL FOBT  12/20/2018   COVID-19 Vaccine (3 - Pfizer risk series) 08/20/2020   INFLUENZA VACCINE  05/30/2021   TETANUS/TDAP  10/08/2024   Hepatitis C Screening  Completed   HIV Screening  Completed   HPV VACCINES  Aged Out    Objective:  Vitals:   05/18/21 0939  BP: (!) 143/72  Pulse: (!) 108   There is no height or weight on file to calculate BMI.  Physical Exam Constitutional:      Comments: Is in good spirits and more talkative than usual.  He is seated in his wheelchair.  Cardiovascular:     Rate and Rhythm: Normal rate and regular rhythm.     Heart sounds: No murmur heard. Pulmonary:     Effort: Pulmonary effort is normal.     Breath sounds:  Normal breath sounds.  Psychiatric:        Mood and Affect: Mood normal.    Lab Results Lab Results  Component Value Date   WBC CANCELED 05/03/2021   HGB 19.4 (H) 11/19/2019   HCT 56.3 (H) 11/19/2019   MCV 92.8 11/19/2019   PLT 261 11/19/2019    Lab Results  Component  Value Date   CREATININE 1.30 (H) 05/03/2021   BUN 7 05/03/2021   NA 139 05/03/2021   K 3.6 05/03/2021   CL 99 05/03/2021   CO2 27 05/03/2021    Lab Results  Component Value Date   ALT 10 05/03/2021   AST 17 05/03/2021   ALKPHOS 408 (H) 05/22/2017   BILITOT 0.7 05/03/2021    Lab Results  Component Value Date   CHOL 213 (H) 11/19/2019   HDL 36 (L) 11/19/2019   LDLCALC 135 (H) 11/19/2019   TRIG 275 (H) 11/19/2019   CHOLHDL 5.9 (H) 11/19/2019   Lab Results  Component Value Date   LABRPR NON-REACTIVE 05/03/2021   HIV 1 RNA Quant  Date Value  05/03/2021 Not Detected Copies/mL  06/14/2020 <20 Copies/mL  11/19/2019 <20 NOT DETECTED copies/mL   CD4 T Cell Abs (/uL)  Date Value  05/03/2021 516  06/14/2020 734  11/19/2019 765     Problem List Items Addressed This Visit       High   Human immunodeficiency virus (HIV) disease ([place])    His infection remains under excellent, long-term control.  He will continue Biktarvy and [name] and follow-up after lab work in 1 year.       Relevant Medications   bictegravir-emtricitabine-tenofovir AF (BIKTARVY) 50-200-25 MG TABS tablet   darunavir-cobicistat ([name]) 800-150 MG tablet   Other Relevant Orders   CBC   T-helper cell (CD4)- ([facility] clinic only)   Comprehensive metabolic panel   RPR   HIV-1 RNA quant-no reflex-bld   Long term current use of anticoagulant    I asked him to make an appointment in Coumadin clinic ASAP.       Phantom limb syndrome with pain (HCC)    His phantom limb pain is under good control with his chronic pain regimen.          Michel Bickers, MD [facility] for Infectious [place]  Group [phone] pager   [phone] cell 05/18/2021, 10:01 AM

## 2021-05-23 ENCOUNTER — Telehealth: Payer: Self-pay

## 2021-05-23 ENCOUNTER — Other Ambulatory Visit: Payer: Self-pay | Admitting: Internal Medicine

## 2021-05-23 DIAGNOSIS — G894 Chronic pain syndrome: Secondary | ICD-10-CM

## 2021-05-23 MED ORDER — OXYCODONE HCL 5 MG PO TABS: 5.0000 mg | ORAL_TABLET | Freq: Two times a day (BID) | ORAL | 0 refills | Status: DC | PRN

## 2021-05-23 MED ORDER — MORPHINE SULFATE ER 30 MG PO TBCR: 90.0000 mg | EXTENDED_RELEASE_TABLET | Freq: Three times a day (TID) | ORAL | 0 refills | Status: DC

## 2021-05-23 NOTE — Telephone Encounter (Signed)
Patient called requesting refill of morphine and oxycodone. Will route to provider.   [name], RN

## 2021-05-26 ENCOUNTER — Other Ambulatory Visit: Payer: Self-pay

## 2021-05-26 DIAGNOSIS — R634 Abnormal weight loss: Secondary | ICD-10-CM

## 2021-05-26 MED ORDER — ENSURE PO LIQD: 1.0000 | Freq: Three times a day (TID) | ORAL | 11 refills | Status: DC

## 2021-06-21 ENCOUNTER — Telehealth: Payer: Self-pay

## 2021-06-21 DIAGNOSIS — G894 Chronic pain syndrome: Secondary | ICD-10-CM

## 2021-06-22 ENCOUNTER — Other Ambulatory Visit: Payer: Self-pay | Admitting: Internal Medicine

## 2021-06-22 DIAGNOSIS — G894 Chronic pain syndrome: Secondary | ICD-10-CM

## 2021-06-22 MED ORDER — OXYCODONE HCL 5 MG PO TABS: 5.0000 mg | ORAL_TABLET | Freq: Two times a day (BID) | ORAL | 0 refills | Status: DC | PRN

## 2021-06-22 MED ORDER — MORPHINE SULFATE ER 30 MG PO TBCR: 90.0000 mg | EXTENDED_RELEASE_TABLET | Freq: Three times a day (TID) | ORAL | 0 refills | Status: DC

## 2021-06-22 NOTE — Telephone Encounter (Signed)
Pt calling requesting refill on Morphine and oxycodone. Pt would like them sent to his [facility] in [place]

## 2021-07-19 ENCOUNTER — Other Ambulatory Visit: Payer: Self-pay

## 2021-07-19 DIAGNOSIS — G894 Chronic pain syndrome: Secondary | ICD-10-CM

## 2021-07-21 ENCOUNTER — Other Ambulatory Visit: Payer: Self-pay | Admitting: Internal Medicine

## 2021-07-21 DIAGNOSIS — G894 Chronic pain syndrome: Secondary | ICD-10-CM

## 2021-07-21 MED ORDER — MORPHINE SULFATE ER 30 MG PO TBCR: 90.0000 mg | EXTENDED_RELEASE_TABLET | Freq: Three times a day (TID) | ORAL | 0 refills | Status: DC

## 2021-07-21 MED ORDER — OXYCODONE HCL 5 MG PO TABS: 5.0000 mg | ORAL_TABLET | Freq: Two times a day (BID) | ORAL | 0 refills | Status: DC | PRN

## 2021-08-16 ENCOUNTER — Other Ambulatory Visit: Payer: Self-pay | Admitting: Pharmacist

## 2021-08-16 ENCOUNTER — Other Ambulatory Visit: Payer: Self-pay

## 2021-08-16 MED ORDER — WARFARIN SODIUM 5 MG PO TABS: ORAL_TABLET | ORAL | 0 refills | Status: DC

## 2021-08-16 NOTE — Telephone Encounter (Signed)
Patient called requesting warfarin refill. I advised him I could only do a limited supply, as he has not been compiant to [facility] for INR check. Will provide quantum sufficient to see him stay sustained on warfarin through next Monday 24-OCT-22 for an INR.

## 2021-08-22 ENCOUNTER — Other Ambulatory Visit: Payer: Self-pay

## 2021-08-22 ENCOUNTER — Ambulatory Visit (INDEPENDENT_AMBULATORY_CARE_PROVIDER_SITE_OTHER): Payer: Medicare Other | Admitting: Pharmacist

## 2021-08-22 DIAGNOSIS — Z7901 Long term (current) use of anticoagulants: Secondary | ICD-10-CM

## 2021-08-22 DIAGNOSIS — Z86718 Personal history of other venous thrombosis and embolism: Secondary | ICD-10-CM

## 2021-08-22 DIAGNOSIS — G894 Chronic pain syndrome: Secondary | ICD-10-CM

## 2021-08-22 LAB — POCT INR: INR: 2.3 (ref 2.0–3.0)

## 2021-08-22 MED ORDER — WARFARIN SODIUM 5 MG PO TABS: ORAL_TABLET | ORAL | 0 refills | Status: DC

## 2021-08-22 NOTE — Patient Instructions (Signed)
Patient instructed to take medications as defined in the Anti-coagulation Track section of this encounter.  Patient instructed to take today's dose.  Patient instructed to take 1 & 1/2 tablets of your peach-colored 5mg  strength warfarin tablets Sundays, Tuesdays, Thursdays and Saturdays; on Mondays, Wednesdays and Fridays,  ONLY 1 tablet.  Patient verbalized understanding of these instructions.

## 2021-08-22 NOTE — Progress Notes (Signed)
Anticoagulation Management [name] is a 60 y.o. male who reports to the clinic for monitoring of warfarin treatment.    Indication: DVT , History of; Long term current use of oral anticoagulant warfarin to maintain INR 2.0 - 3.0.  Duration: indefinite Supervising physician:  Matthew Ochs, MD  Anticoagulation Clinic Visit History: Patient does not report signs/symptoms of bleeding or thromboembolism  Other recent changes: No diet, medications, lifestyle Anticoagulation Episode Summary     Current INR goal:  2.0-3.0  TTR:  77.5 % (10.6 y)  Next INR check:  11/14/2021  INR from last check:  2.3 (08/22/2021)  Weekly max warfarin dose:    Target end date:  Indefinite  INR check location:  Anticoagulation Clinic  Preferred lab:    Send INR reminders to:  [facility]   Indications   History of DVT (deep vein thrombosis) [Z86.718] Long term current use of anticoagulant [Z79.01]        Comments:          Anticoagulation Care Providers     Provider Role Specialty Phone number   Matthew Bickers, MD  Infectious Diseases [phone]       Allergies  Allergen Reactions   Dapsone     REACTION: fever   Megestrol    Sulfa Antibiotics Other (See Comments)    "put's me down bad"   Sulfamethoxazole-Trimethoprim     REACTION: fever    Current Outpatient Medications:    bictegravir-emtricitabine-tenofovir AF (BIKTARVY) 50-200-25 MG TABS tablet, Take 1 tablet by mouth daily., Disp: 30 tablet, Rfl: 11   darunavir-cobicistat ([name]) 800-150 MG tablet, TAKE 1 TABLET BY MOUTH EVERY DAY DO NOT CRUSH BREAK OR CHEW TABLETS TAKE WITH FOOD, Disp: 30 tablet, Rfl: 11   Ensure (ENSURE), Take 1 Can by mouth 4 (four) times daily - after meals and at bedtime., Disp: 237 mL, Rfl: 11   gabapentin (NEURONTIN) 400 MG capsule, TAKE 1 CAPSULE(400 MG) BY MOUTH FOUR TIMES DAILY, Disp: 360 capsule, Rfl: 3   morphine (MS CONTIN) 30 MG 12 hr tablet, Take 3 tablets (90 mg total) by mouth 3  (three) times daily., Disp: 270 tablet, Rfl: 0   pantoprazole (PROTONIX) 40 MG tablet, TAKE 1 TABLET(40 MG) BY MOUTH DAILY, Disp: 90 tablet, Rfl: 0   atorvastatin (LIPITOR) 20 MG tablet, Take 1 tablet (20 mg total) by mouth daily., Disp: 90 tablet, Rfl: 3   oxyCODONE (OXY IR/ROXICODONE) 5 MG immediate release tablet, Take 1 tablet (5 mg total) by mouth 2 (two) times daily as needed for severe pain., Disp: 60 tablet, Rfl: 0   warfarin (COUMADIN) 5 MG tablet, TAKE 1 AND 1/2 TABLET BY MOUTH ON SUNDAY, TUESDAY, THURSDAY AND SATURDAY AND TAKE 1 TABLET BY MOUTH ON ALL OTHER DAYS, Disp: 108 tablet, Rfl: 0 Past Medical History:  Diagnosis Date   Chronic kidney disease    History of chicken pox    History of DVT of lower extremity    right   HIV (human immunodeficiency virus infection) ([place])    Hyperlipidemia    Left hip pain 03/09/2013   Pneumonia    Protein malnutrition ([place]) 08/2015   Social History   Socioeconomic History   Marital status: Widowed    Spouse name: Not on file   Number of children: 3   Years of education: Not on file   Highest education level: Not on file  Occupational History    Employer: UNEMPLOYED  Tobacco Use   Smoking status: Every Day  Packs/day: 0.75    Years: 36.00    Pack years: 27.00    Types: Cigarettes   Smokeless tobacco: Never   Tobacco comments:    1 pk a day   Vaping Use   Vaping Use: Never used  Substance and Sexual Activity   Alcohol use: No    Alcohol/week: 0.0 standard drinks   Drug use: Yes    Frequency: 2.0 times per week    Types: Marijuana   Sexual activity: Not Currently    Comment: denied condoms  Other Topics Concern   Not on file  Social History Narrative   Not on file   Social Determinants of Health   Financial Resource Strain: Not on file  Food Insecurity: Not on file  Transportation Needs: Not on file  Physical Activity: Not on file  Stress: Not on file  Social Connections: Not on file   Family History  Problem  Relation Age of Onset   Arthritis Mother    Kidney disease Maternal Grandfather     ASSESSMENT Recent Results: The most recent result is correlated with 45 mg per week: Lab Results  Component Value Date   INR 2.3 08/22/2021   INR 2.7 04/26/2020   INR 2.8 (H) 02/23/2020   PROTIME 38.4 (H) 04/01/2012    Anticoagulation Dosing: Description   Take 1 & 1/2 tablets of your peach-colored 5mg  strength warfarin tablets Sundays, Tuesdays, Thursdays and Saturdays; on Mondays, Wednesdays and Fridays,  ONLY 1 tablet.      INR today: Therapeutic  PLAN Weekly dose was unchanged.   Patient Instructions  Patient instructed to take medications as defined in the Anti-coagulation Track section of this encounter.  Patient instructed to take today's dose.  Patient instructed to take 1 & 1/2 tablets of your peach-colored 5mg  strength warfarin tablets Sundays, Tuesdays, Thursdays and Saturdays; on Mondays, Wednesdays and Fridays,  ONLY 1 tablet.  Patient verbalized understanding of these instructions.   Patient advised to contact clinic or seek medical attention if signs/symptoms of bleeding or thromboembolism occur.  Patient verbalized understanding by repeating back information and was advised to contact me if further medication-related questions arise. Patient was also provided an information handout.  Follow-up Return in 12 weeks (on 11/14/2021) for Follow up INR.  [name], PharmD, CPP  15 minutes spent face-to-face with the patient during the encounter. 50% of time spent on education, including signs/sx bleeding and clotting, as well as food and drug interactions with warfarin. 50% of time was spent on fingerprick POC INR sample collection,processing, results determination, and documentation in [email or web address]

## 2021-08-22 NOTE — Telephone Encounter (Signed)
Patient called requesting refill on pain medication. 

## 2021-08-23 ENCOUNTER — Other Ambulatory Visit: Payer: Self-pay | Admitting: Internal Medicine

## 2021-08-23 DIAGNOSIS — G894 Chronic pain syndrome: Secondary | ICD-10-CM

## 2021-08-23 MED ORDER — OXYCODONE HCL 5 MG PO TABS
5.0000 mg | ORAL_TABLET | Freq: Two times a day (BID) | ORAL | 0 refills | Status: DC | PRN
Start: 2021-08-23 — End: 2021-09-20

## 2021-08-23 MED ORDER — OXYCODONE HCL 5 MG PO TABS: 5.0000 mg | ORAL_TABLET | Freq: Two times a day (BID) | ORAL | 0 refills | Status: DC | PRN

## 2021-08-23 MED ORDER — MORPHINE SULFATE ER 30 MG PO TBCR: 90.0000 mg | EXTENDED_RELEASE_TABLET | Freq: Three times a day (TID) | ORAL | 0 refills | Status: DC

## 2021-08-31 NOTE — Progress Notes (Signed)
INTERNAL MEDICINE TEACHING ATTENDING ADDENDUM   I agree with pharmacy recommendations as outlined in their note.   Simmie Camerer, MD  

## 2021-09-20 ENCOUNTER — Other Ambulatory Visit: Payer: Self-pay | Admitting: Internal Medicine

## 2021-09-20 ENCOUNTER — Telehealth: Payer: Self-pay

## 2021-09-20 DIAGNOSIS — G894 Chronic pain syndrome: Secondary | ICD-10-CM

## 2021-09-20 MED ORDER — OXYCODONE HCL 5 MG PO TABS: 5.0000 mg | ORAL_TABLET | Freq: Two times a day (BID) | ORAL | 0 refills | Status: DC | PRN

## 2021-09-20 MED ORDER — MORPHINE SULFATE ER 30 MG PO TBCR: 90.0000 mg | EXTENDED_RELEASE_TABLET | Freq: Three times a day (TID) | ORAL | 0 refills | Status: DC

## 2021-09-20 NOTE — Telephone Encounter (Signed)
Patient called requesting refills of pain medication. Will route to provider.   [name], RN

## 2021-10-18 ENCOUNTER — Other Ambulatory Visit: Payer: Self-pay | Admitting: Internal Medicine

## 2021-10-18 ENCOUNTER — Telehealth: Payer: Self-pay

## 2021-10-18 DIAGNOSIS — G894 Chronic pain syndrome: Secondary | ICD-10-CM

## 2021-10-18 MED ORDER — MORPHINE SULFATE ER 30 MG PO TBCR
90.0000 mg | EXTENDED_RELEASE_TABLET | Freq: Three times a day (TID) | ORAL | 0 refills | Status: DC
Start: 2021-10-18 — End: 2021-10-20

## 2021-10-18 MED ORDER — OXYCODONE HCL 5 MG PO TABS: 5.0000 mg | ORAL_TABLET | Freq: Two times a day (BID) | ORAL | 0 refills | Status: DC | PRN

## 2021-10-18 NOTE — Telephone Encounter (Signed)
Received call in triage from patient requesting pain medication refill. Routing to MD for approval. [name]

## 2021-10-20 ENCOUNTER — Other Ambulatory Visit: Payer: Self-pay | Admitting: Internal Medicine

## 2021-10-20 DIAGNOSIS — G629 Polyneuropathy, unspecified: Secondary | ICD-10-CM

## 2021-10-20 DIAGNOSIS — G894 Chronic pain syndrome: Secondary | ICD-10-CM

## 2021-10-20 MED ORDER — OXYCODONE HCL 5 MG PO TABS: 5.0000 mg | ORAL_TABLET | Freq: Two times a day (BID) | ORAL | 0 refills | Status: DC | PRN

## 2021-10-20 MED ORDER — MORPHINE SULFATE ER 30 MG PO TBCR: 90.0000 mg | EXTENDED_RELEASE_TABLET | Freq: Three times a day (TID) | ORAL | 0 refills | Status: DC

## 2021-10-20 NOTE — Telephone Encounter (Signed)
Thanks

## 2021-11-14 ENCOUNTER — Ambulatory Visit: Payer: Medicare Other

## 2021-11-17 ENCOUNTER — Telehealth: Payer: Self-pay

## 2021-11-17 ENCOUNTER — Other Ambulatory Visit: Payer: Self-pay | Admitting: Internal Medicine

## 2021-11-17 DIAGNOSIS — G894 Chronic pain syndrome: Secondary | ICD-10-CM

## 2021-11-17 MED ORDER — OXYCODONE HCL 5 MG PO TABS: 5.0000 mg | ORAL_TABLET | Freq: Two times a day (BID) | ORAL | 0 refills | Status: DC | PRN

## 2021-11-17 MED ORDER — MORPHINE SULFATE ER 30 MG PO TBCR: 90.0000 mg | EXTENDED_RELEASE_TABLET | Freq: Three times a day (TID) | ORAL | 0 refills | Status: DC

## 2021-11-17 NOTE — Telephone Encounter (Signed)
Patient would like refills on his Oxycodone and MS Contin - stated he would be in [facility] Monday and can pick up then

## 2021-11-18 ENCOUNTER — Telehealth: Payer: Self-pay

## 2021-11-18 NOTE — Telephone Encounter (Signed)
Patient called stating that morphine and oxycodone was sent to [facility] mail order [facility] and those medications can't be filled there. Dr.Campbell would you please send medications to [facility] on [address].     [place], CMA

## 2021-11-21 ENCOUNTER — Ambulatory Visit: Payer: Medicare Other

## 2021-11-21 NOTE — Telephone Encounter (Signed)
Patient called to follow up on medication request. Patient advised that Dr. Megan Salon will return to the office tomorrow and will be notified of request. Patient verbalized understanding. [name]

## 2021-11-22 ENCOUNTER — Other Ambulatory Visit: Payer: Self-pay | Admitting: Internal Medicine

## 2021-11-22 DIAGNOSIS — G894 Chronic pain syndrome: Secondary | ICD-10-CM

## 2021-11-22 MED ORDER — OXYCODONE HCL 5 MG PO TABS: 5.0000 mg | ORAL_TABLET | Freq: Two times a day (BID) | ORAL | 0 refills | Status: DC | PRN

## 2021-11-22 MED ORDER — MORPHINE SULFATE ER 30 MG PO TBCR: 90.0000 mg | EXTENDED_RELEASE_TABLET | Freq: Three times a day (TID) | ORAL | 0 refills | Status: DC

## 2021-11-26 ENCOUNTER — Other Ambulatory Visit: Payer: Self-pay | Admitting: Pharmacist

## 2021-11-28 ENCOUNTER — Other Ambulatory Visit: Payer: Self-pay | Admitting: Pharmacist

## 2021-11-28 NOTE — Telephone Encounter (Signed)
Requested by Triage Nurse to refill prescription. Patient was due for INR visit 16-JAN-23. Has been rescheduled for February, 2023. Prescription refill sent electronically.

## 2021-11-28 NOTE — Telephone Encounter (Signed)
Last OV was 06/2020 with PCP. I called pt to schedule an appt - call transferred to front office. Appt scheduled with Dr Coy Saunas 12/24/21.

## 2021-12-14 ENCOUNTER — Encounter: Payer: Medicare Other | Admitting: Student

## 2021-12-15 ENCOUNTER — Other Ambulatory Visit: Payer: Self-pay | Admitting: Internal Medicine

## 2021-12-15 ENCOUNTER — Telehealth: Payer: Self-pay

## 2021-12-15 MED ORDER — MORPHINE SULFATE ER 30 MG PO TBCR: 90.0000 mg | EXTENDED_RELEASE_TABLET | Freq: Three times a day (TID) | ORAL | 0 refills | Status: DC

## 2021-12-15 NOTE — Telephone Encounter (Signed)
Received call from Roderic Palau, Pharmacist with [facility] regarding Morphine Rx. States that pharmacy is currently in the process of changing manufactures for current Morphine supplier. States that patient was not able to get the complete 270 tablets from previous refill. Only received 224 tabs. Pharmacy would like to know if MD could send in a new prescription for reminder of the 46 talets.  Will forward message to MD. Leatrice Jewels, RMA

## 2021-12-20 ENCOUNTER — Other Ambulatory Visit (HOSPITAL_COMMUNITY): Payer: Self-pay

## 2021-12-20 ENCOUNTER — Telehealth: Payer: Self-pay

## 2021-12-20 NOTE — Telephone Encounter (Signed)
Patient called requesting refills of his pain medications, morphine and oxycodone. Will route to provider.   He states his copay was over $100 the last time he picked up his medications.   [name], RN

## 2021-12-21 ENCOUNTER — Telehealth: Payer: Self-pay

## 2021-12-21 ENCOUNTER — Other Ambulatory Visit (HOSPITAL_COMMUNITY): Payer: Self-pay

## 2021-12-21 NOTE — Telephone Encounter (Signed)
[facility] Patient Advocate Encounter   Received notification from [facility] that prior authorization for MS Contin is required.   PA submitted on 12/21/21 Key [id number] Status is pending    [facility] will continue to follow.   Ileene Patrick, [facility] Patient [facility] Phone: [phone] Fax:  [phone]

## 2021-12-22 ENCOUNTER — Telehealth: Payer: Self-pay

## 2021-12-22 ENCOUNTER — Other Ambulatory Visit: Payer: Self-pay

## 2021-12-22 ENCOUNTER — Other Ambulatory Visit (HOSPITAL_COMMUNITY): Payer: Self-pay

## 2021-12-22 DIAGNOSIS — G894 Chronic pain syndrome: Secondary | ICD-10-CM

## 2021-12-22 MED ORDER — OXYCODONE HCL 5 MG PO TABS: 5.0000 mg | ORAL_TABLET | Freq: Two times a day (BID) | ORAL | 0 refills | Status: DC | PRN

## 2021-12-22 MED ORDER — MORPHINE SULFATE ER 30 MG PO TBCR: 90.0000 mg | EXTENDED_RELEASE_TABLET | Freq: Three times a day (TID) | ORAL | 0 refills | Status: DC

## 2021-12-22 NOTE — Telephone Encounter (Signed)
[facility] Patient Advocate Encounter  Prior Authorization for MS CONTIN has been approved.    PA# [id number] Effective dates: 10/30/21 through 10/29/22  Patients co-pay is $0.00.   [facility] will continue to follow.  Ileene Patrick, [facility] Patient [facility] Phone: [phone] Fax:  [phone]

## 2021-12-23 ENCOUNTER — Encounter: Payer: Self-pay | Admitting: Student

## 2021-12-23 ENCOUNTER — Ambulatory Visit (INDEPENDENT_AMBULATORY_CARE_PROVIDER_SITE_OTHER): Payer: Medicare Other | Admitting: Student

## 2021-12-23 VITALS — BP 118/65 | HR 110 | Temp 98.5°F | Ht 71.0 in | Wt 120.5 lb

## 2021-12-23 DIAGNOSIS — R911 Solitary pulmonary nodule: Secondary | ICD-10-CM

## 2021-12-23 DIAGNOSIS — D751 Secondary polycythemia: Secondary | ICD-10-CM

## 2021-12-23 DIAGNOSIS — E43 Unspecified severe protein-calorie malnutrition: Secondary | ICD-10-CM

## 2021-12-23 DIAGNOSIS — R000 Tachycardia, unspecified: Secondary | ICD-10-CM | POA: Diagnosis not present

## 2021-12-23 DIAGNOSIS — M87059 Idiopathic aseptic necrosis of unspecified femur: Secondary | ICD-10-CM | POA: Diagnosis not present

## 2021-12-23 DIAGNOSIS — Z1211 Encounter for screening for malignant neoplasm of colon: Secondary | ICD-10-CM

## 2021-12-23 DIAGNOSIS — Z993 Dependence on wheelchair: Secondary | ICD-10-CM | POA: Diagnosis not present

## 2021-12-23 DIAGNOSIS — Z86718 Personal history of other venous thrombosis and embolism: Secondary | ICD-10-CM

## 2021-12-23 DIAGNOSIS — B20 Human immunodeficiency virus [HIV] disease: Secondary | ICD-10-CM

## 2021-12-23 DIAGNOSIS — R195 Other fecal abnormalities: Secondary | ICD-10-CM

## 2021-12-23 DIAGNOSIS — E785 Hyperlipidemia, unspecified: Secondary | ICD-10-CM | POA: Diagnosis not present

## 2021-12-23 DIAGNOSIS — Z89611 Acquired absence of right leg above knee: Secondary | ICD-10-CM | POA: Diagnosis not present

## 2021-12-23 DIAGNOSIS — G546 Phantom limb syndrome with pain: Secondary | ICD-10-CM | POA: Diagnosis not present

## 2021-12-23 DIAGNOSIS — F17200 Nicotine dependence, unspecified, uncomplicated: Secondary | ICD-10-CM | POA: Diagnosis not present

## 2021-12-23 DIAGNOSIS — Z23 Encounter for immunization: Secondary | ICD-10-CM

## 2021-12-23 DIAGNOSIS — Z0000 Encounter for general adult medical examination without abnormal findings: Secondary | ICD-10-CM

## 2021-12-23 DIAGNOSIS — Z7901 Long term (current) use of anticoagulants: Secondary | ICD-10-CM

## 2021-12-23 MED ORDER — ATORVASTATIN CALCIUM 40 MG PO TABS: 40.0000 mg | ORAL_TABLET | Freq: Every day | ORAL | 3 refills | Status: DC

## 2021-12-23 NOTE — Progress Notes (Signed)
° °  CC: Follow-up  HPI:  [name] is a 61 y.o. wheelchair-bound male with PMH as below who presents to clinic to follow-up on his chronic medical problems. Please see problem based charting for evaluation, assessment and plan.  Past Medical History:  Diagnosis Date   Chronic kidney disease    History of chicken pox    History of DVT of lower extremity    right   HIV (human immunodeficiency virus infection) ([place])    Hyperlipidemia    Left hip pain 03/09/2013   Pneumonia    Protein malnutrition ([place]) 08/2015    Review of Systems:  Constitutional: Positive for unintentional weight loss. Negative for fatigue Eyes: Negative for visual changes Respiratory: Negative for shortness of breath or pleuritic chest pain Cardiac: Negative for chest pain or palpitations. MSK: Positive for phantom limb pain. Neuro: Positive for generalized weakness and occasional leg numbness. Negative for headache, dizziness or weakness  Physical Exam: General: Pleasant, cachectic Black male in a wheelchair. No acute distress. Cardiac: Tachycardic. Regular rhythm. No murmurs, rubs or gallops. No LE edema Respiratory: Lungs CTAB. No wheezing or crackles. Normal WOB Abdominal: Soft, symmetric and non tender. Normal BS. Skin: Warm, dry.  Darkening of the LLE Extremities: Right AKA with prosthetic leg. Warm LLE with 1+ DP pulses, unpalpable PT pulses.  2+ radial pulses.   Neuro: A&O x 3. Normal sensation to gross touch. Psych: Appropriate mood and affect.  Vitals:   12/23/21 1106 12/23/21 1152  BP: (!) 105/58 118/65  Pulse: (!) 114 (!) 110  Temp: 98.5 F (36.9 C)   TempSrc: Oral   SpO2: 92%   Weight: 120 lb 7.5 oz (54.6 kg)   Height: 5\' 11"  (1.803 m)     Assessment & Plan:   See Encounters Tab for problem based charting.  Patient discussed with Dr. Louann Liv, MD, MPH

## 2021-12-23 NOTE — Assessment & Plan Note (Signed)
Patient received flu vaccine today.  FOBT kit given to patient for annual colon cancer screening.  Instructed to bring kit to lab during his visit with Dr. Elie Confer next Monday

## 2021-12-23 NOTE — Assessment & Plan Note (Addendum)
Likely secondary to long-term cigarette use. Last CBC 2 years ago showed hemoglobin 19.4 and hematocrit of 56. We will repeat CBC today. If persistent, patient will need further evaluation to rule out other causes of polycythemia.  -- Follow-up CBC -- Counseled on smoking cessation  Addendum: CBC shows hgb 20.8, hct of 59%, RBC 6.12, plt 235. Will check serum EPO and JAK2 to further assess if this is a primary vs secondary polycythemia.  --Check serum EPO, JAK2  Addendum 2: 12/29/21 Patient's erythropoietin level came back high at 37.2. JAK2 labs still pending. At this point, this is likely a secondary polycythemia. This is likely due to his significant smoking causing erythrocytosis from CO exposure, hypoxia and volume contraction. EPO-producing tumor can still be on the differential. -- Pending JAK2 lab -- Patient has been counseled on smoking cessation. -- Pending colon cancer and lung cancer screenings

## 2021-12-23 NOTE — Assessment & Plan Note (Signed)
Has been stable on warfarin therapy.  Denies any falls or bleeds.  Scheduled to follow-up with Dr. Elie Confer on Monday, February 27. -- Continue on warfarin 5 mg (1.5 tablets on SaSuTuTh, 1 tablet on MWF) as prescribed

## 2021-12-23 NOTE — Assessment & Plan Note (Signed)
Multiple groundglass nodules were found on CT chest in 2010.  This was thought to be less likely to be malignancy.  No follow-up CT chest has been done since then -- CT chest lung nodule follow-up -- Counseled on smoking cessation

## 2021-12-23 NOTE — Assessment & Plan Note (Addendum)
Last lipid panel 2 years ago showed LDL of 135.  Patient was initially on atorvastatin 20 mg daily.  However, patient states he ran out of the prescription many months ago.  Patient's ASCVD score is 13.4%.  He will require a moderate to high intensity statin to reduce his 10-year CVD risk to 6.0%.   Plan: -- Follow-up repeat lipid panel -- Start atorvastatin 40 mg daily -- Counseled on smoking cessation, not ready at this point  Addendum: Lipid panel shows Tchol of 224, HDL 43 and LDL of 154. Patient started on high-intensity statin. Follow up lipid panel in 6 months.

## 2021-12-23 NOTE — Assessment & Plan Note (Signed)
Patient's weight down to 120 pounds today. Last documented weight in clinic was 128 pounds 2.5 years ago. Patient cachectic appearing on exam. States his appetite is poor sometimes. He has been using Ensure to supplement his diet. --Continue Ensure, 1 can 4 times daily --Lung cancer and colon cancer screenings

## 2021-12-23 NOTE — Assessment & Plan Note (Addendum)
Patient has been wheelchair-bound for the last 8 years. Transition from regular wheelchair to power wheelchair 3 years ago. Patient has been able to perform his ADLs with a power wheelchair. Patient reports that his power wheelchair has started to wear down. He is unable to navigate it as well as he did before. He is able to use his walker briefly with short distances in his apartment.  Patient will require new power wheelchair to help him maintain his functional status and avoid any falls. -- Placing new DME order for a new power wheelchair.

## 2021-12-23 NOTE — Assessment & Plan Note (Signed)
Patient continues to smoke cigarettes daily.  States he is down to about a half a pack per day but not ready to quit. Patient informed of his increased risk of developing more blood clots with his smoking.  Patient expressed understanding and will quit when ready. -- Continue to reassess readiness to quit at next office visit

## 2021-12-23 NOTE — Patient Instructions (Addendum)
Thank you, Mr.Nyzir Tinner for allowing [place] to provide your care today. Today we discussed your cholesterol, blood clots, blood pressure, HIV and heart rate.  You received the flu vaccine today.  I am also sending you with a kit to get screening for colon cancer.   For your cholesterol, I am restarting you back on a cholesterol medication and check your cholesterol today.  For your blood pressure, drink 2 to 3 L of water daily to stay hydrated. Call our clinic if you start getting dizzy, have trouble breathing or you feel your heart beating fast.  For your blood clots, continue taking your warfarin and follow-up with Dr. Elie Confer.  For your HIV, continue taking your medications and follow-up with Dr. Megan Salon as scheduled.  I have ordered the following labs for you:   Lab Orders         Fecal occult blood, imunochemical         CBC no Diff         TSH         Lipid Profile      I will call if any are abnormal. All of your labs can be accessed through "My Chart".  I have ordered the following tests: FOBT for colon cancer screen, chest CT pulmonary follow-up  I have ordered the following medication/changed the following medications:  Start atorvastatin 40 mg daily  My Chart Access: [email or web address]  Please follow-up in 3 months  Please make sure to arrive 15 minutes prior to your next appointment. If you arrive late, you may be asked to reschedule.    We look forward to seeing you next time. Please call our clinic at [phone] if you have any questions or concerns. The best time to call is Monday-Friday from 9am-4pm, but there is someone available 24/7. If after hours or the weekend, call the main hospital number and ask for the Internal Medicine Resident On-Call. If you need medication refills, please notify your pharmacy one week in advance and they will send [place] a request.   Thank you for letting us take part in your care. Wishing you the  best!  Lacinda Axon, MD 12/23/2021, 11:54 AM IM Resident, PGY-2 [facility] 41:10

## 2021-12-23 NOTE — Assessment & Plan Note (Signed)
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  Patient has been on long-term anticoagulation with warfarin since then.  Follows with Dr. Elie Confer.  Last INR 2.3 in October 2022.  Patient denies any leg or calf pain. -- Scheduled to follow-up with Dr. Johney Maine on Monday, February 27th -- Continue warfarin 5 mg as prescribed

## 2021-12-23 NOTE — Assessment & Plan Note (Signed)
Patient continues to get his pain medications from [facility].  Dr. Megan Salon has been prescribing it his pain medicines for many years. -- Continue MS Contin 90 mg 3 times daily -- Continue gabapentin 400 mg 4 times daily -- Continue oxycodone 5 mg 2 times daily as needed

## 2021-12-23 NOTE — Assessment & Plan Note (Signed)
Pain is well controlled with current chronic pain regimen that include MS Contin, gabapentin and as needed oxycodone.

## 2021-12-23 NOTE — Assessment & Plan Note (Addendum)
Patient with a history of tachycardia dating back to 4 years ago. Heart rate has been persistently between the 100 and 115 over the last 3 years. Patient denies any palpitations, blurry vision or dizziness. He state that he does not drink as much water and has been told by his daughter to drink more water. We will rule out hyperthyroidism with TSH today. Patient with no pleuritic chest pain, SOB, lightheadedness and currently on stable dose of anticoagulation so PE less likely but possible due to his history of DVTs. More likely this is secondary to dehydration in the setting of poor p.o. intake. We will continue to monitor closely. --Follow-up TSH --Patient advised to drink about 2 to 3 L of water daily  Addendum:  TSH is borderline low at 0.495. Will check a free T4 to rule out hyperthyroidism.  --Check free T4

## 2021-12-23 NOTE — Assessment & Plan Note (Signed)
Patient follows with Dr. Megan Salon yearly. Infection has been very well controlled on current HIV regimen. Patient had undetectable viral load in July 2022. -- Continue Biktarvy and [name] -- Continue to follow-up with Dr. Megan Salon yearly

## 2021-12-24 LAB — CBC
Hematocrit: 59 % — ABNORMAL HIGH (ref 37.5–51.0)
Hemoglobin: 20.8 g/dL (ref 13.0–17.7)
MCH: 34 pg — ABNORMAL HIGH (ref 26.6–33.0)
MCHC: 35.3 g/dL (ref 31.5–35.7)
MCV: 96 fL (ref 79–97)
Platelets: 235 10*3/uL (ref 150–450)
RBC: 6.12 x10E6/uL — ABNORMAL HIGH (ref 4.14–5.80)
RDW: 15.1 % (ref 11.6–15.4)
WBC: 5.5 10*3/uL (ref 3.4–10.8)

## 2021-12-24 LAB — LIPID PANEL
Chol/HDL Ratio: 5.2 ratio — ABNORMAL HIGH (ref 0.0–5.0)
Cholesterol, Total: 224 mg/dL — ABNORMAL HIGH (ref 100–199)
HDL: 43 mg/dL (ref >39)
LDL Chol Calc (NIH): 154 mg/dL — ABNORMAL HIGH (ref 0–99)
Triglycerides: 148 mg/dL (ref 0–149)
VLDL Cholesterol Cal: 27 mg/dL (ref 5–40)

## 2021-12-24 LAB — TSH: TSH: 0.495 u[IU]/mL (ref 0.450–4.500)

## 2021-12-26 ENCOUNTER — Other Ambulatory Visit (INDEPENDENT_AMBULATORY_CARE_PROVIDER_SITE_OTHER): Payer: Medicare Other

## 2021-12-26 ENCOUNTER — Ambulatory Visit (INDEPENDENT_AMBULATORY_CARE_PROVIDER_SITE_OTHER): Payer: Medicare Other | Admitting: Pharmacist

## 2021-12-26 DIAGNOSIS — Z7901 Long term (current) use of anticoagulants: Secondary | ICD-10-CM

## 2021-12-26 DIAGNOSIS — Z86718 Personal history of other venous thrombosis and embolism: Secondary | ICD-10-CM | POA: Diagnosis not present

## 2021-12-26 DIAGNOSIS — D751 Secondary polycythemia: Secondary | ICD-10-CM | POA: Diagnosis not present

## 2021-12-26 DIAGNOSIS — Z1211 Encounter for screening for malignant neoplasm of colon: Secondary | ICD-10-CM

## 2021-12-26 DIAGNOSIS — R000 Tachycardia, unspecified: Secondary | ICD-10-CM

## 2021-12-26 LAB — POCT INR: INR: 3.1 — AB (ref 2.0–3.0)

## 2021-12-26 NOTE — Progress Notes (Signed)
Anticoagulation Management [name] is a 61 y.o. male who reports to the clinic for monitoring of warfarin treatment.    Indication: DVT , history of; long term current use of oral anticoagulant warfarin, to target INR 2.0 - 3.0.  Duration: indefinite Supervising physician: Aldine Contes  Anticoagulation Clinic Visit History: Patient does not report signs/symptoms of bleeding or thromboembolism  Other recent changes: No diet, medications, lifestyle changes.  Anticoagulation Episode Summary     Current INR goal:  2.0-3.0  TTR:  77.8 % (11 y)  Next INR check:  03/20/2022  INR from last check:  3.1 (12/26/2021)  Weekly max warfarin dose:    Target end date:  Indefinite  INR check location:  Anticoagulation Clinic  Preferred lab:    Send INR reminders to:  [facility]   Indications   History of DVT (deep vein thrombosis) [Z86.718] Long term current use of anticoagulant [Z79.01]        Comments:          Anticoagulation Care Providers     Provider Role Specialty Phone number   Michel Bickers, MD  Infectious Diseases [phone]       Allergies  Allergen Reactions   Dapsone     REACTION: fever   Megestrol    Sulfa Antibiotics Other (See Comments)    "put's me down bad"   Sulfamethoxazole-Trimethoprim     REACTION: fever    Current Outpatient Medications:    atorvastatin (LIPITOR) 40 MG tablet, Take 1 tablet (40 mg total) by mouth daily., Disp: 90 tablet, Rfl: 3   bictegravir-emtricitabine-tenofovir AF (BIKTARVY) 50-200-25 MG TABS tablet, Take 1 tablet by mouth daily., Disp: 30 tablet, Rfl: 11   darunavir-cobicistat ([name]) 800-150 MG tablet, TAKE 1 TABLET BY MOUTH EVERY DAY DO NOT CRUSH BREAK OR CHEW TABLETS TAKE WITH FOOD, Disp: 30 tablet, Rfl: 11   Ensure (ENSURE), Take 1 Can by mouth 4 (four) times daily - after meals and at bedtime., Disp: 237 mL, Rfl: 11   gabapentin (NEURONTIN) 400 MG capsule, TAKE 1 CAPSULE(400 MG) BY MOUTH FOUR TIMES DAILY, Disp:  360 capsule, Rfl: 3   morphine (MS CONTIN) 30 MG 12 hr tablet, Take 3 tablets (90 mg total) by mouth 3 (three) times daily., Disp: 46 tablet, Rfl: 0   oxyCODONE (OXY IR/ROXICODONE) 5 MG immediate release tablet, Take 1 tablet (5 mg total) by mouth 2 (two) times daily as needed for severe pain., Disp: 60 tablet, Rfl: 0   pantoprazole (PROTONIX) 40 MG tablet, TAKE 1 TABLET(40 MG) BY MOUTH DAILY, Disp: 90 tablet, Rfl: 0   warfarin (COUMADIN) 5 MG tablet, TAKE 1 AND 1/2 TABLET BY MOUTH ON SUNDAY, TUESDAY, THURSDAY AND SATURDAY AND TAKE 1 TABLET ON ALL OTHER DAYS, Disp: 108 tablet, Rfl: 0 Past Medical History:  Diagnosis Date   Chronic kidney disease    History of chicken pox    History of DVT of lower extremity    right   HIV (human immunodeficiency virus infection) (HCC)    Hyperlipidemia    Left hip pain 03/09/2013   Pneumonia    Protein malnutrition ([place]) 08/2015   Social History   Socioeconomic History   Marital status: Widowed    Spouse name: Not on file   Number of children: 3   Years of education: Not on file   Highest education level: Not on file  Occupational History    Employer: UNEMPLOYED  Tobacco Use   Smoking status: Every Day    Packs/day: 0.75  Years: 36.00    Pack years: 27.00    Types: Cigarettes   Smokeless tobacco: Never   Tobacco comments:    1 pk a day   Vaping Use   Vaping Use: Never used  Substance and Sexual Activity   Alcohol use: No    Alcohol/week: 0.0 standard drinks   Drug use: Yes    Frequency: 2.0 times per week    Types: Marijuana   Sexual activity: Not Currently    Comment: denied condoms  Other Topics Concern   Not on file  Social History Narrative   Not on file   Social Determinants of Health   Financial Resource Strain: Not on file  Food Insecurity: Not on file  Transportation Needs: Not on file  Physical Activity: Not on file  Stress: Not on file  Social Connections: Not on file   Family History  Problem Relation Age of  Onset   Arthritis Mother    Kidney disease Maternal Grandfather     ASSESSMENT Recent Results: The most recent result is correlated with 45 mg per week: Lab Results  Component Value Date   INR 3.1 (A) 12/26/2021   INR 2.3 08/22/2021   INR 2.7 04/26/2020   PROTIME 38.4 (H) 04/01/2012    Anticoagulation Dosing: Description   Take 1 & 1/2 tablets of your peach-colored 5mg  strength warfarin tablets Tuesdays, Thursdays and Saturdays.  On all other days, Mondays, Wednesdays ,Fridays,  and Sundays, take ONLY 1 tablet.      INR today: Supratherapeutic  PLAN Weekly dose was decreased by 6% to 42.5 mg per week  Patient Instructions  Patient instructed to take medications as defined in the Anti-coagulation Track section of this encounter.  Patient instructed to take today's dose.  Patient instructed to take 1 & 1/2 tablets of your peach-colored 5mg  strength warfarin tablets Tuesdays, Thursdays and Saturdays.  On all other days, Mondays, Wednesdays ,Fridays,  and Sundays, take ONLY 1 tablet.  Patient verbalized understanding of these instructions.   Patient advised to contact clinic or seek medical attention if signs/symptoms of bleeding or thromboembolism occur.  Patient verbalized understanding by repeating back information and was advised to contact me if further medication-related questions arise. Patient was also provided an information handout.  Follow-up Return in 12 weeks (on 03/20/2022) for Follow up INR.  [name], PharmD, CPP  15 minutes spent face-to-face with the patient during the encounter. 50% of time spent on education, including signs/sx bleeding and clotting, as well as food and drug interactions with warfarin. 50% of time was spent on fingerprick POC INR sample collection,processing, results determination, and documentation in http://www.kim.net/.

## 2021-12-26 NOTE — Addendum Note (Signed)
Addended byLinwood Dibbles on: 12/26/2021 12:00 AM   Modules accepted: Orders

## 2021-12-26 NOTE — Patient Instructions (Signed)
Patient instructed to take medications as defined in the Anti-coagulation Track section of this encounter.  Patient instructed to take today's dose.  Patient instructed to take 1 & 1/2 tablets of your peach-colored 5mg  strength warfarin tablets Tuesdays, Thursdays and Saturdays.  On all other days, Mondays, Wednesdays ,Fridays,  and Sundays, take ONLY 1 tablet.  Patient verbalized understanding of these instructions.

## 2021-12-27 NOTE — Progress Notes (Signed)
INTERNAL MEDICINE TEACHING ATTENDING ADDENDUM - Aldine Contes M.D  Duration- indefinite, Indication- DVT, INR- supratherapeutic. Agree with pharmacy recommendations as outlined in their note.

## 2021-12-28 ENCOUNTER — Telehealth: Payer: Self-pay | Admitting: *Deleted

## 2021-12-28 ENCOUNTER — Other Ambulatory Visit: Payer: Self-pay | Admitting: Internal Medicine

## 2021-12-28 ENCOUNTER — Telehealth: Payer: Self-pay

## 2021-12-28 LAB — FECAL OCCULT BLOOD, IMMUNOCHEMICAL: Fecal Occult Bld: POSITIVE — AB

## 2021-12-28 MED ORDER — MORPHINE SULFATE ER 30 MG PO TBCR: 90.0000 mg | EXTENDED_RELEASE_TABLET | Freq: Three times a day (TID) | ORAL | 0 refills | Status: DC

## 2021-12-28 NOTE — Telephone Encounter (Signed)
Received faxed results from [facility] for FOBT. Positive for occult blood. Patient's BMI at last OV was 16.8. INR 3.1 on 2/27. Spoke with Dr. Howie Ill who will contact patient today. ?

## 2021-12-28 NOTE — Telephone Encounter (Signed)
Patient called office requesting refill on Morphine. States that he runs out on Saturday. Will forward message to Md for refill. ?Leatrice Jewels, RMA ? ?

## 2021-12-28 NOTE — Telephone Encounter (Signed)
[name] is a 61 yo wheelchair-bound M w PMH of HIV, HLD, and protein malnutrition. I called patient to review +fecal occult blood from recent office visit.  I recommended that I place a referral to GI at this time so that patient could have colonoscopy to further evaluate symptoms of weight loss and work up for colon cancer. [name] stated that he did not have an interest in undergoing a colonoscopy. He endorsed understanding that colonoscopy would the next step to work up possible colon cancer, but states that he does not wish to pursue a colonoscopy with his underlying illnesses. ?

## 2021-12-29 DIAGNOSIS — R195 Other fecal abnormalities: Secondary | ICD-10-CM | POA: Insufficient documentation

## 2021-12-29 NOTE — Assessment & Plan Note (Signed)
Patient's acute stool blood test came back positive. Patient's last test was negative in 2019 and did not have any screening test done until the current one.  Dr. Howie Ill called patient and discussed with him a referral to GI for colonoscopy as the next step. She documented that patient refused GI referral and did not want to get a colonoscopy done. I called patient today to discuss this test and the importance of getting colonoscopy to further evaluate the cause of the bleed. Patient reports his daughter has even encouraged him to get the colonoscopy but he is not currently ready to get this done. He informed me of an experience few years ago with his wife who had a colonoscopy done in which they found a colon cancer that had spread. His wife had to get multiple surgeries in [facility] and that was difficult to deal with for himself and his wife. His wife ended up passing away and so he is not ready to go to something like this. I empathized and expressed my sincere condolences to him then informed him that due to his recent weight loss, I want to make sure that we are able to rule out colon cancer. I stressed to him the importance of being proactive in figuring out the cause of his GI bleed. Patient stated to me that he thinks his weight loss is from him not eating as much but  Recently, he has been eating most of his meals and supplements.  He is also drinking 2-3 bottles of water per day as instructed.  He denies any loss of appetite or abdominal pain. States he will continue to think about this and will follow-up with me in a few months. Patient advised to follow-up sooner if he has bloody stools, loss of appetite or more fatigue. I spent 30 minutes on the phone with patient actively listening to his concerns, empathizing with him and educating him about why I think he needs a colonoscopy. I also informed patient that my goal is to give him all the information to make sure he is able to make an informed  decision considering the risk and benefits of both of his options. Encourage patient to discuss this further with his daughter. ?--Revisit conversation about GI referral for colonoscopy at next office next office visit ?

## 2021-12-30 NOTE — Addendum Note (Signed)
Addended byLinwood Dibbles on: 12/30/2021 11:53 AM   Modules accepted: Orders

## 2022-01-02 LAB — CALR + MPL (REFLEXED)

## 2022-01-03 ENCOUNTER — Other Ambulatory Visit: Payer: Self-pay | Admitting: Internal Medicine

## 2022-01-03 DIAGNOSIS — R000 Tachycardia, unspecified: Secondary | ICD-10-CM | POA: Diagnosis not present

## 2022-01-03 DIAGNOSIS — D751 Secondary polycythemia: Secondary | ICD-10-CM | POA: Diagnosis not present

## 2022-01-03 LAB — CALR + MPL (REFLEXED)

## 2022-01-03 LAB — JAK2 W/REFLEX TO CALR/MPL

## 2022-01-03 LAB — ERYTHROPOIETIN: Erythropoietin: 37.2 m[IU]/mL — ABNORMAL HIGH (ref 2.6–18.5)

## 2022-01-03 LAB — T4, FREE: Free T4: 1.13 ng/dL (ref 0.82–1.77)

## 2022-01-03 MED ORDER — MORPHINE SULFATE ER 30 MG PO TBCR: 90.0000 mg | EXTENDED_RELEASE_TABLET | Freq: Three times a day (TID) | ORAL | 0 refills | Status: DC

## 2022-01-04 NOTE — Progress Notes (Signed)
Internal Medicine Clinic Attending  Case discussed with Dr. Amponsah  At the time of the visit.  We reviewed the resident's history and exam and pertinent patient test results.  I agree with the assessment, diagnosis, and plan of care documented in the resident's note.  

## 2022-01-19 ENCOUNTER — Telehealth: Payer: Self-pay

## 2022-01-19 ENCOUNTER — Other Ambulatory Visit: Payer: Self-pay | Admitting: Internal Medicine

## 2022-01-19 DIAGNOSIS — G894 Chronic pain syndrome: Secondary | ICD-10-CM

## 2022-01-19 MED ORDER — OXYCODONE HCL 5 MG PO TABS: 5.0000 mg | ORAL_TABLET | Freq: Two times a day (BID) | ORAL | 0 refills | Status: DC | PRN

## 2022-01-19 NOTE — Telephone Encounter (Signed)
Patient called requesting refill on Oxycodone 5 mg at [facility] in [place]. ?

## 2022-01-23 ENCOUNTER — Telehealth: Payer: Self-pay

## 2022-01-23 NOTE — Telephone Encounter (Signed)
Initiated PA for patient's oxycodone. Waiting on medical questions to be faxed to office before completing PA.  ?Once everything is faxed to patient's insurance provider will receive fax with determination 24-48 hours after submitting.  ?Reg #: [id number] ?P: [phone] ?ID: [id number] ?Leatrice Jewels, RMA ? ?

## 2022-01-24 NOTE — Telephone Encounter (Signed)
Received notification that PA for oxycodone was approved through 10/29/22. Approval faxed to [facility] [phone]). ? ?[name], RN ? ?

## 2022-02-02 ENCOUNTER — Telehealth: Payer: Self-pay

## 2022-02-02 ENCOUNTER — Other Ambulatory Visit: Payer: Self-pay | Admitting: Internal Medicine

## 2022-02-02 MED ORDER — MORPHINE SULFATE ER 30 MG PO TBCR: 90.0000 mg | EXTENDED_RELEASE_TABLET | Freq: Three times a day (TID) | ORAL | 0 refills | Status: DC

## 2022-02-02 NOTE — Telephone Encounter (Signed)
Patient called requesting refill for Morphine '30mg'$  12hr tablet. Routing message to Dr. Megan Salon for approval. ?[name] ? ?

## 2022-02-10 ENCOUNTER — Ambulatory Visit (HOSPITAL_COMMUNITY): Admission: RE | Admit: 2022-02-10 | Payer: Medicare Other | Source: Ambulatory Visit

## 2022-02-23 ENCOUNTER — Telehealth: Payer: Self-pay

## 2022-02-23 ENCOUNTER — Other Ambulatory Visit: Payer: Self-pay | Admitting: Internal Medicine

## 2022-02-23 DIAGNOSIS — Z515 Encounter for palliative care: Secondary | ICD-10-CM | POA: Insufficient documentation

## 2022-02-23 DIAGNOSIS — G894 Chronic pain syndrome: Secondary | ICD-10-CM

## 2022-02-23 MED ORDER — OXYCODONE HCL 5 MG PO TABS: 5.0000 mg | ORAL_TABLET | Freq: Two times a day (BID) | ORAL | 0 refills | Status: DC | PRN

## 2022-02-23 NOTE — Telephone Encounter (Signed)
Patient called requesting refills for oxycodone. Will route to provider.  ? ?[id number] - [place], [place] - [address] AT [place]  ?[place] [phone]  ? ?[name], RN ? ?

## 2022-02-24 ENCOUNTER — Other Ambulatory Visit: Payer: Self-pay | Admitting: Pharmacist

## 2022-02-24 ENCOUNTER — Telehealth: Payer: Self-pay

## 2022-02-24 MED ORDER — WARFARIN SODIUM 5 MG PO TABS: ORAL_TABLET | ORAL | 0 refills | Status: DC

## 2022-02-24 NOTE — Telephone Encounter (Signed)
Patient called office today requesting refills on Oxycodone 5 mg tablets. Informed patient that refill was sent yesterday to [facility]. Advised he call them today. Patient verbalized understanding. Will call office with any concerns.  ?Leatrice Jewels, RMA  ?

## 2022-02-24 NOTE — Telephone Encounter (Signed)
Requests refill on his warfarin '5mg'$  strength tablets, 1 tablet on Sundays, Mondays, Wednesdays and Fridays; 1&1/2 x '5mg'$  (7.'5mg'$  dose) on Tuesdays, Thursdays and Saturdays. Will send to his pharmacy.  ?

## 2022-03-09 ENCOUNTER — Telehealth: Payer: Self-pay

## 2022-03-09 ENCOUNTER — Other Ambulatory Visit: Payer: Self-pay | Admitting: Internal Medicine

## 2022-03-09 MED ORDER — MORPHINE SULFATE ER 30 MG PO TBCR: 90.0000 mg | EXTENDED_RELEASE_TABLET | Freq: Three times a day (TID) | ORAL | 0 refills | Status: DC

## 2022-03-09 NOTE — Telephone Encounter (Signed)
Patient called requesting refill of morphine 30 mg be sent to [facility] on [address].  ? ?[name], RN ? ?

## 2022-03-20 ENCOUNTER — Ambulatory Visit (INDEPENDENT_AMBULATORY_CARE_PROVIDER_SITE_OTHER): Payer: Medicare Other | Admitting: Pharmacist

## 2022-03-20 ENCOUNTER — Encounter (INDEPENDENT_AMBULATORY_CARE_PROVIDER_SITE_OTHER): Payer: Self-pay

## 2022-03-20 ENCOUNTER — Other Ambulatory Visit: Payer: Self-pay | Admitting: Pharmacist

## 2022-03-20 DIAGNOSIS — Z86718 Personal history of other venous thrombosis and embolism: Secondary | ICD-10-CM | POA: Diagnosis not present

## 2022-03-20 DIAGNOSIS — Z7901 Long term (current) use of anticoagulants: Secondary | ICD-10-CM

## 2022-03-20 LAB — POCT INR: INR: 2.4 (ref 2.0–3.0)

## 2022-03-20 MED ORDER — WARFARIN SODIUM 5 MG PO TABS: ORAL_TABLET | ORAL | 0 refills | Status: DC

## 2022-03-20 NOTE — Progress Notes (Signed)
Take Anticoagulation Management [name] is a 61 y.o. male who reports to the clinic for monitoring of warfarin treatment.    Indication: DVT , History of; Long term current use of oral anticoagulant, warfarin--target INR 2.0 - 3.0.  Duration: indefinite Supervising physician: Aldine Contes  Anticoagulation Clinic Visit History: Patient does not report signs/symptoms of bleeding or thromboembolism  Other recent changes: No diet, medications, lifestyle changes cited by the patient.  Anticoagulation Episode Summary     Current INR goal:  2.0-3.0  TTR:  78.0 % (11.2 y)  Next INR check:  05/22/2022  INR from last check:  2.4 (03/20/2022)  Weekly max warfarin dose:    Target end date:  Indefinite  INR check location:  Anticoagulation Clinic  Preferred lab:    Send INR reminders to:  [facility]   Indications   History of DVT (deep vein thrombosis) [Z86.718] Long term current use of anticoagulant [Z79.01]        Comments:          Anticoagulation Care Providers     Provider Role Specialty Phone number   Michel Bickers, MD  Infectious Diseases [phone]       Allergies  Allergen Reactions   Dapsone     REACTION: fever   Megestrol    Sulfa Antibiotics Other (See Comments)    "put's me down bad"   Sulfamethoxazole-Trimethoprim     REACTION: fever    Current Outpatient Medications:    atorvastatin (LIPITOR) 40 MG tablet, Take 1 tablet (40 mg total) by mouth daily., Disp: 90 tablet, Rfl: 3   bictegravir-emtricitabine-tenofovir AF (BIKTARVY) 50-200-25 MG TABS tablet, Take 1 tablet by mouth daily., Disp: 30 tablet, Rfl: 11   darunavir-cobicistat ([name]) 800-150 MG tablet, TAKE 1 TABLET BY MOUTH EVERY DAY DO NOT CRUSH BREAK OR CHEW TABLETS TAKE WITH FOOD, Disp: 30 tablet, Rfl: 11   Ensure (ENSURE), Take 1 Can by mouth 4 (four) times daily - after meals and at bedtime., Disp: 237 mL, Rfl: 11   gabapentin (NEURONTIN) 400 MG capsule, TAKE 1 CAPSULE(400 MG) BY  MOUTH FOUR TIMES DAILY, Disp: 360 capsule, Rfl: 3   morphine (MS CONTIN) 30 MG 12 hr tablet, Take 3 tablets (90 mg total) by mouth 3 (three) times daily., Disp: 270 tablet, Rfl: 0   oxyCODONE (OXY IR/ROXICODONE) 5 MG immediate release tablet, Take 1 tablet (5 mg total) by mouth 2 (two) times daily as needed for severe pain., Disp: 60 tablet, Rfl: 0   pantoprazole (PROTONIX) 40 MG tablet, TAKE 1 TABLET(40 MG) BY MOUTH DAILY, Disp: 90 tablet, Rfl: 0   warfarin (COUMADIN) 5 MG tablet, TAKE 1 AND 1/2 TABLET BY MOUTH ON TUESDAYS, THURSDAYS AND SATURDAYS.TAKE 1 TABLET ON ALL OTHER DAYS., Disp: 36 tablet, Rfl: 0 Past Medical History:  Diagnosis Date   Chronic kidney disease    History of chicken pox    History of DVT of lower extremity    right   HIV (human immunodeficiency virus infection) ([place])    Hyperlipidemia    Left hip pain 03/09/2013   Pneumonia    Protein malnutrition ([place]) 08/2015   Social History   Socioeconomic History   Marital status: Widowed    Spouse name: Not on file   Number of children: 3   Years of education: Not on file   Highest education level: Not on file  Occupational History    Employer: UNEMPLOYED  Tobacco Use   Smoking status: Every Day    Packs/day: 0.75  Years: 36.00    Pack years: 27.00    Types: Cigarettes   Smokeless tobacco: Never   Tobacco comments:    1 pk a day   Vaping Use   Vaping Use: Never used  Substance and Sexual Activity   Alcohol use: No    Alcohol/week: 0.0 standard drinks   Drug use: Yes    Frequency: 2.0 times per week    Types: Marijuana   Sexual activity: Not Currently    Comment: denied condoms  Other Topics Concern   Not on file  Social History Narrative   Not on file   Social Determinants of Health   Financial Resource Strain: Not on file  Food Insecurity: Not on file  Transportation Needs: Not on file  Physical Activity: Not on file  Stress: Not on file  Social Connections: Not on file   Family History   Problem Relation Age of Onset   Arthritis Mother    Kidney disease Maternal Grandfather     ASSESSMENT Recent Results: The most recent result is correlated with 42.5 mg per week: Lab Results  Component Value Date   INR 2.4 03/20/2022   INR 3.1 (A) 12/26/2021   INR 2.3 08/22/2021   PROTIME 38.4 (H) 04/01/2012    Anticoagulation Dosing: Description   Take 1 & 1/2 tablets of your peach-colored '5mg'$  strength warfarin tablets Tuesdays, Thursdays and Saturdays.  On all other days, Mondays, Wednesdays ,Fridays,  and Sundays, take ONLY 1 tablet.      INR today: Therapeutic  PLAN Weekly dose was unchanged.   Patient Instructions  Patient instructed to take medications as defined in the Anti-coagulation Track section of this encounter.  Patient instructed to take today's dose.  Patient instructed to take 1 & 1/2 tablets of your peach-colored '5mg'$  strength warfarin tablets Tuesdays, Thursdays and Saturdays.  On all other days, Mondays, Wednesdays ,Fridays,  and Sundays, take ONLY 1 tablet.  Patient verbalized understanding of these instructions.   Patient advised to contact clinic or seek medical attention if signs/symptoms of bleeding or thromboembolism occur.  Patient verbalized understanding by repeating back information and was advised to contact me if further medication-related questions arise. Patient was also provided an information handout.  Follow-up Return in 9 weeks (on 05/22/2022) for Follow up INR.  [name], PharmD, CPP  15 minutes spent face-to-face with the patient during the encounter. 50% of time spent on education, including signs/sx bleeding and clotting, as well as food and drug interactions with warfarin. 50% of time was spent on fingerprick POC INR sample collection,processing, results determination, and documentation in http://www.kim.net/.

## 2022-03-20 NOTE — Telephone Encounter (Signed)
Requests refill on warfarin. Will send electronically.

## 2022-03-20 NOTE — Patient Instructions (Signed)
Patient instructed to take medications as defined in the Anti-coagulation Track section of this encounter.  Patient instructed to take today's dose.  Patient instructed to take 1 & 1/2 tablets of your peach-colored '5mg'$  strength warfarin tablets Tuesdays, Thursdays and Saturdays.  On all other days, Mondays, Wednesdays ,Fridays,  and Sundays, take ONLY 1 tablet.  Patient verbalized understanding of these instructions.

## 2022-03-22 NOTE — Progress Notes (Signed)
INTERNAL MEDICINE TEACHING ATTENDING ADDENDUM - Aldine Contes M.D  Duration- indefinite, Indication- DVT, INR- therapeutic. Agree with pharmacy recommendations as outlined in their note.

## 2022-03-29 ENCOUNTER — Other Ambulatory Visit: Payer: Self-pay | Admitting: Internal Medicine

## 2022-03-29 DIAGNOSIS — G894 Chronic pain syndrome: Secondary | ICD-10-CM

## 2022-03-29 MED ORDER — OXYCODONE HCL 5 MG PO TABS: 5.0000 mg | ORAL_TABLET | Freq: Two times a day (BID) | ORAL | 0 refills | Status: DC | PRN

## 2022-04-11 ENCOUNTER — Other Ambulatory Visit: Payer: Self-pay | Admitting: Internal Medicine

## 2022-04-11 ENCOUNTER — Telehealth: Payer: Self-pay

## 2022-04-11 MED ORDER — MORPHINE SULFATE ER 30 MG PO TBCR: 90.0000 mg | EXTENDED_RELEASE_TABLET | Freq: Three times a day (TID) | ORAL | 0 refills | Status: DC

## 2022-04-11 NOTE — Telephone Encounter (Signed)
Patient called requesting refill of morphine '30mg'$  tablets. Will route to provider.   [name], RN

## 2022-04-17 ENCOUNTER — Telehealth: Payer: Self-pay

## 2022-04-17 MED ORDER — MORPHINE SULFATE ER 30 MG PO TBCR: 90.0000 mg | EXTENDED_RELEASE_TABLET | Freq: Three times a day (TID) | ORAL | 0 refills | Status: DC

## 2022-04-17 NOTE — Telephone Encounter (Signed)
Canceled Morphine prescription at [facility] on [address]. Notified patient that prescription has been ordered at [facility] in [facility] on [address]. Patient verbalized understanding and has no further questions.   [name], RN

## 2022-04-17 NOTE — Telephone Encounter (Signed)
Patient called, he was tearful saying that he cannot wait until Dr. Yassmin Binegar Salon returns to the office for his morphine. He is not sure when he last took his morphine, but reports that he is experiencing withdrawal symptoms. Advised him that RN would reach out to a provider in the office to see if they are able to assist, but that we may have to wait until Dr. Tonna Palazzi Salon is back in the office tomorrow.  [name], RN

## 2022-04-17 NOTE — Addendum Note (Signed)
Addended by: Mauricio Po D on: 04/17/2022 10:48 AM   Modules accepted: Orders

## 2022-04-17 NOTE — Telephone Encounter (Signed)
Morphine request received and reviewed refill information. New prescription sent to [facility] per request. Please call previous pharmacy to cancel the previous prescription from Dr. Megan Salon.

## 2022-04-17 NOTE — Telephone Encounter (Signed)
Patient daughter called stating that [facility] on [address] doesn't have Morphine in stock. Requesting for prescription to be sent to [facility] on [address] in [place].   I contacted [facility] on [address] to be sure Morphine wasn't in stock and patient has been unable to get prescription.    [place], CMA

## 2022-04-18 NOTE — Telephone Encounter (Signed)
Patient called and states the pharmacy only gave him 71 tablets of morphine instead of 270. Spoke with [facility], they only had 71 tablets on hand to give him and they voided the remainder of the prescription.   Notified patient that he will need to find a pharmacy with morphine in stock and that the remainder can be called in at that time. Patient verbalized understanding and has no further questions.   [name], RN

## 2022-04-22 ENCOUNTER — Other Ambulatory Visit: Payer: Self-pay | Admitting: Internal Medicine

## 2022-04-22 ENCOUNTER — Encounter: Payer: Self-pay | Admitting: *Deleted

## 2022-04-22 DIAGNOSIS — B20 Human immunodeficiency virus [HIV] disease: Secondary | ICD-10-CM

## 2022-04-23 ENCOUNTER — Other Ambulatory Visit: Payer: Self-pay | Admitting: Pharmacist

## 2022-04-24 ENCOUNTER — Other Ambulatory Visit: Payer: Self-pay | Admitting: Pharmacist

## 2022-04-24 ENCOUNTER — Telehealth: Payer: Self-pay

## 2022-04-24 MED ORDER — WARFARIN SODIUM 5 MG PO TABS: ORAL_TABLET | ORAL | 0 refills | Status: DC

## 2022-04-24 NOTE — Telephone Encounter (Signed)
Patient called office today requesting refill on Morphine. States pharmacy only filled 71 tablets. Is currently out and currently experiencing withdraws.  Called [facility] regarding patient's prescription. Per Pharmacist they were not able to fill Morphine due to not having enough in stock. Morphine is currently on back order. Recommended prescription be sent to [facility] or [facility] pharmacy due to having different warehouse manufactures. Patient was aware that if he filled prescription at time he would forfeit remainder of prescription.  Will forward message to Dr. Orvan Falconer to advise on prescription.  [name]

## 2022-04-24 NOTE — Telephone Encounter (Signed)
Appt 6/27 - fill with [name]

## 2022-04-25 ENCOUNTER — Other Ambulatory Visit: Payer: Self-pay

## 2022-04-25 ENCOUNTER — Other Ambulatory Visit: Payer: Self-pay | Admitting: Internal Medicine

## 2022-04-25 ENCOUNTER — Other Ambulatory Visit (HOSPITAL_COMMUNITY): Payer: Self-pay

## 2022-04-25 ENCOUNTER — Other Ambulatory Visit: Payer: Medicare Other

## 2022-04-25 DIAGNOSIS — B20 Human immunodeficiency virus [HIV] disease: Secondary | ICD-10-CM

## 2022-04-25 MED ORDER — PREZCOBIX 800-150 MG PO TABS: ORAL_TABLET | ORAL | 0 refills | Status: DC

## 2022-04-25 MED ORDER — MORPHINE SULFATE ER 30 MG PO TBCR
90.0000 mg | EXTENDED_RELEASE_TABLET | Freq: Three times a day (TID) | ORAL | 0 refills | Status: DC
  Filled 2022-04-25: qty 199, 22d supply, fill #0

## 2022-04-25 MED ORDER — MORPHINE SULFATE ER 30 MG PO TBCR
90.0000 mg | EXTENDED_RELEASE_TABLET | Freq: Three times a day (TID) | ORAL | 0 refills | Status: DC
  Filled 2022-04-25: qty 270, 30d supply, fill #0

## 2022-04-26 ENCOUNTER — Other Ambulatory Visit (HOSPITAL_COMMUNITY): Payer: Self-pay

## 2022-04-26 LAB — T-HELPER CELL (CD4) - (RCID CLINIC ONLY)
CD4 % Helper T Cell: 24 % — ABNORMAL LOW (ref 33–65)
CD4 T Cell Abs: 688 /uL (ref 400–1790)

## 2022-04-28 ENCOUNTER — Encounter: Payer: Self-pay | Admitting: Internal Medicine

## 2022-04-28 ENCOUNTER — Telehealth: Payer: Self-pay

## 2022-04-28 ENCOUNTER — Other Ambulatory Visit: Payer: Self-pay | Admitting: Infectious Disease

## 2022-04-28 DIAGNOSIS — G894 Chronic pain syndrome: Secondary | ICD-10-CM

## 2022-04-28 LAB — CBC
HCT: 58 % — ABNORMAL HIGH (ref 38.5–50.0)
Hemoglobin: 20.2 g/dL — ABNORMAL HIGH (ref 13.2–17.1)
MCH: 34.7 pg — ABNORMAL HIGH (ref 27.0–33.0)
MCHC: 34.8 g/dL (ref 32.0–36.0)
MCV: 99.7 fL (ref 80.0–100.0)
MPV: 10.2 fL (ref 7.5–12.5)
Platelets: 236 10*3/uL (ref 140–400)
RBC: 5.82 10*6/uL — ABNORMAL HIGH (ref 4.20–5.80)
RDW: 13.4 % (ref 11.0–15.0)
WBC: 6.2 10*3/uL (ref 3.8–10.8)

## 2022-04-28 LAB — HIV-1 RNA QUANT-NO REFLEX-BLD
HIV 1 RNA Quant: NOT DETECTED Copies/mL
HIV-1 RNA Quant, Log: NOT DETECTED Log cps/mL

## 2022-04-28 LAB — COMPREHENSIVE METABOLIC PANEL
AG Ratio: 1.3 (calc) (ref 1.0–2.5)
ALT: 28 U/L (ref 9–46)
AST: 20 U/L (ref 10–35)
Albumin: 4.1 g/dL (ref 3.6–5.1)
Alkaline phosphatase (APISO): 69 U/L (ref 35–144)
BUN: 9 mg/dL (ref 7–25)
CO2: 34 mmol/L — ABNORMAL HIGH (ref 20–32)
Calcium: 9.6 mg/dL (ref 8.6–10.3)
Chloride: 92 mmol/L — ABNORMAL LOW (ref 98–110)
Creat: 1.17 mg/dL (ref 0.70–1.35)
Globulin: 3.2 g/dL (calc) (ref 1.9–3.7)
Glucose, Bld: 102 mg/dL — ABNORMAL HIGH (ref 65–99)
Potassium: 3.2 mmol/L — ABNORMAL LOW (ref 3.5–5.3)
Sodium: 137 mmol/L (ref 135–146)
Total Bilirubin: 1 mg/dL (ref 0.2–1.2)
Total Protein: 7.3 g/dL (ref 6.1–8.1)

## 2022-04-28 LAB — RPR: RPR Ser Ql: NONREACTIVE

## 2022-04-28 LAB — SPECIMEN COMPROMISED

## 2022-04-28 MED ORDER — OXYCODONE HCL 5 MG PO TABS
5.0000 mg | ORAL_TABLET | Freq: Two times a day (BID) | ORAL | 0 refills | Status: DC | PRN
Start: 2022-04-28 — End: 2022-05-17

## 2022-04-28 NOTE — Telephone Encounter (Signed)
RX sent in by Dr Tommy Medal.

## 2022-04-28 NOTE — Telephone Encounter (Signed)
Patient called requesting Oxycodone refill and wants it sent to [facility] on [address]. Can be reached at [phone].

## 2022-05-10 ENCOUNTER — Other Ambulatory Visit: Payer: Self-pay

## 2022-05-10 ENCOUNTER — Ambulatory Visit (INDEPENDENT_AMBULATORY_CARE_PROVIDER_SITE_OTHER): Payer: Medicare Other | Admitting: Internal Medicine

## 2022-05-10 ENCOUNTER — Encounter: Payer: Self-pay | Admitting: Internal Medicine

## 2022-05-10 DIAGNOSIS — B20 Human immunodeficiency virus [HIV] disease: Secondary | ICD-10-CM

## 2022-05-10 DIAGNOSIS — F17200 Nicotine dependence, unspecified, uncomplicated: Secondary | ICD-10-CM | POA: Diagnosis not present

## 2022-05-10 DIAGNOSIS — G546 Phantom limb syndrome with pain: Secondary | ICD-10-CM | POA: Diagnosis not present

## 2022-05-10 MED ORDER — PREZCOBIX 800-150 MG PO TABS: ORAL_TABLET | ORAL | 11 refills | Status: DC

## 2022-05-10 MED ORDER — BIKTARVY 50-200-25 MG PO TABS: 1.0000 | ORAL_TABLET | Freq: Every day | ORAL | 11 refills | Status: DC

## 2022-05-10 NOTE — Assessment & Plan Note (Signed)
I talked to him again about the importance of cutting down but currently he does not feel that he has the ability to stop smoking.

## 2022-05-10 NOTE — Assessment & Plan Note (Signed)
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  I told him that if he ever has difficulty getting his pain medication filled to let us know right away.

## 2022-05-10 NOTE — Assessment & Plan Note (Signed)
His infection remains under excellent, long-term control.  He will continue Biktarvy and [name] and follow-up for lab work in 6 months.

## 2022-05-10 NOTE — Progress Notes (Signed)
Patient Active Problem List   Diagnosis Date Noted   Phantom limb syndrome with pain ([place]) 03/21/2012    Priority: High   Long term current use of anticoagulant 11/20/2010    Priority: High   History of DVT (deep vein thrombosis) 07/19/2009    Priority: High   Human immunodeficiency virus (HIV) disease ([place]) 11/10/2006    Priority: High   Hyperlipemia 11/10/2006    Priority: High   CIGARETTE SMOKER 11/10/2006    Priority: High   Hospice care patient 02/23/2022   Palliative care patient 02/23/2022   Positive occult stool blood test 12/29/2021   Tachycardia 12/23/2021   Polycythemia 07/02/2020   Uses powered wheelchair 09/19/2019   Long term current use of opiate analgesic 12/17/2015   Pressure ulcer 09/13/2015   Protein-calorie malnutrition, severe 09/13/2015   Avascular necrosis of femoral head ([place]) 05/14/2015   Left adrenal mass ([place]) 05/14/2015   Preventative health care 03/09/2013   ERECTILE DYSFUNCTION, ORGANIC 04/12/2010   GERD 08/23/2009   HYPOGONADISM 07/15/2009   Pulmonary nodule 07/15/2009   Status post above knee amputation ([place]) 07/15/2009   SHINGLES, RECURRENT 11/10/2006   History of alcohol abuse 11/10/2006   PERIPHERAL NEUROPATHY 11/10/2006   ALLERGIC RHINITIS 11/10/2006    Patient's Medications  New Prescriptions   No medications on file  Previous Medications   ASPIRIN 81 PO    Take 81 mg by mouth daily.   ATORVASTATIN (LIPITOR) 40 MG TABLET    Take 1 tablet (40 mg total) by mouth daily.   ENSURE (ENSURE)    Take 1 Can by mouth 4 (four) times daily - after meals and at bedtime.   GABAPENTIN (NEURONTIN) 400 MG CAPSULE    TAKE 1 CAPSULE(400 MG) BY MOUTH FOUR TIMES DAILY   MORPHINE (MS CONTIN) 30 MG 12 HR TABLET    Take 3 tablets (90 mg total) by mouth 3 (three) times daily.   OXYCODONE (OXY IR/ROXICODONE) 5 MG IMMEDIATE RELEASE TABLET    Take 1 tablet (5 mg total) by mouth 2 (two) times daily as needed for severe pain.   PANTOPRAZOLE  (PROTONIX) 40 MG TABLET    TAKE 1 TABLET(40 MG) BY MOUTH DAILY   WARFARIN (COUMADIN) 5 MG TABLET    TAKE 1 AND 1/2 TABLET ON TUESDAY, THURSDAY AND SATURDAY. THEN 1 TABLET ON ALL OTHER DAYS   WARFARIN (COUMADIN) 5 MG TABLET    TAKE 1 AND 1/2 TABLET BY MOUTH ON TUESDAYS, THURSDAYS AND SATURDAYS.TAKE 1 TABLET ON ALL OTHER DAYS.  Modified Medications   Modified Medication Previous Medication   BICTEGRAVIR-EMTRICITABINE-TENOFOVIR AF (BIKTARVY) 50-200-25 MG TABS TABLET bictegravir-emtricitabine-tenofovir AF (BIKTARVY) 50-200-25 MG TABS tablet      Take 1 tablet by mouth daily.    TAKE 1 TABLET BY MOUTH DAILY   DARUNAVIR-COBICISTAT ([name]) 800-150 MG TABLET darunavir-cobicistat ([name]) 800-150 MG tablet      TAKE 1 TABLET BY MOUTH EVERY DAY DO NOT CRUSH BREAK OR CHEW TABLETS TAKE WITH FOOD    TAKE 1 TABLET BY MOUTH EVERY DAY DO NOT CRUSH BREAK OR CHEW TABLETS TAKE WITH FOOD  Discontinued Medications   No medications on file    Subjective: [name] is in for his routine HIV follow-up visit.  He denies missing any doses of his Biktarvy or [name] but says that his pharmacy is sending them on separate dates each month for reasons he cannot understand.  He was out of his morphine for about 3 weeks recently  when his pharmacy said that they were waiting on their supply to come in.  He went about 2 weeks before letting us know and started having abdominal pain, nausea and vomiting.  During that.  He could not eat because of withdrawal symptoms and lost weight.  He is back on his regular morphine and breakthrough oxycodone now and his pain is under much better control.  He is still smoking about a pack of cigarettes daily.  He says that he rarely leaves his house and spends most of his day in his wheelchair watching TV.  He has not been wearing his right leg prosthesis as much because its fitting loosely since he lost weight.  Review of Systems: Review of Systems  Constitutional:  Positive for weight loss.  Negative for fever.  Gastrointestinal:  Negative for abdominal pain, diarrhea, nausea and vomiting.  Musculoskeletal:  Positive for joint pain.    Past Medical History:  Diagnosis Date   Chronic kidney disease    History of chicken pox    History of DVT of lower extremity    right   HIV (human immunodeficiency virus infection) ([place])    Hyperlipidemia    Left hip pain 03/09/2013   Pneumonia    Protein malnutrition ([place]) 08/2015    Social History   Tobacco Use   Smoking status: Every Day    Packs/day: 1.00    Years: 36.00    Total pack years: 36.00    Types: Cigarettes   Smokeless tobacco: Never   Tobacco comments:    1 pk a day   Vaping Use   Vaping Use: Never used  Substance Use Topics   Alcohol use: No    Alcohol/week: 0.0 standard drinks of alcohol   Drug use: Not Currently    Frequency: 2.0 times per week    Types: Marijuana    Family History  Problem Relation Age of Onset   Arthritis Mother    Kidney disease Maternal Grandfather     Allergies  Allergen Reactions   Dapsone     REACTION: fever   Megestrol    Sulfa Antibiotics Other (See Comments)    "put's me down bad"   Sulfamethoxazole-Trimethoprim     REACTION: fever    Health Maintenance  Topic Date Due   Zoster Vaccines- Shingrix (1 of 2) Never done   COLONOSCOPY (Pts 45-44yr Insurance coverage will need to be confirmed)  Never done   COVID-19 Vaccine (3 - Pfizer risk series) 08/20/2020   INFLUENZA VACCINE  05/30/2022   COLON CANCER SCREENING ANNUAL FOBT  12/26/2022   TETANUS/TDAP  10/08/2024   Hepatitis C Screening  Completed   HIV Screening  Completed   HPV VACCINES  Aged Out    Objective:  Vitals:   05/10/22 0943  BP: (!) 99/57  Pulse: (!) 123  Temp: 98.4 F (36.9 C)  TempSrc: Oral  SpO2: 91%  Height: '5\' 11"'$  (1.803 m)   Body mass index is 16.8 kg/m.  Physical Exam Constitutional:      Comments: He is very talkative today.  He is seated in his wheelchair.  He has his right  leg prosthesis on.  Cardiovascular:     Rate and Rhythm: Regular rhythm. Tachycardia present.  Pulmonary:     Effort: Pulmonary effort is normal.     Breath sounds: Normal breath sounds.  Psychiatric:        Mood and Affect: Mood normal.     Lab Results Lab Results  Component Value  Date   WBC 6.2 04/25/2022   HGB 20.2 (H) 04/25/2022   HCT 58.0 (H) 04/25/2022   MCV 99.7 04/25/2022   PLT 236 04/25/2022    Lab Results  Component Value Date   CREATININE 1.17 04/25/2022   BUN 9 04/25/2022   NA 137 04/25/2022   K 3.2 (L) 04/25/2022   CL 92 (L) 04/25/2022   CO2 34 (H) 04/25/2022    Lab Results  Component Value Date   ALT 28 04/25/2022   AST 20 04/25/2022   ALKPHOS 408 (H) 05/22/2017   BILITOT 1.0 04/25/2022    Lab Results  Component Value Date   CHOL 224 (H) 12/23/2021   HDL 43 12/23/2021   LDLCALC 154 (H) 12/23/2021   TRIG 148 12/23/2021   CHOLHDL 5.2 (H) 12/23/2021   Lab Results  Component Value Date   LABRPR NON-REACTIVE 04/25/2022   HIV 1 RNA Quant (Copies/mL)  Date Value  04/25/2022 Not Detected  05/03/2021 Not Detected  06/14/2020 <20   CD4 T Cell Abs (/uL)  Date Value  04/25/2022 688  05/03/2021 516  06/14/2020 734     Problem List Items Addressed This Visit       High   Human immunodeficiency virus (HIV) disease ([place])    His infection remains under excellent, long-term control.  He will continue Biktarvy and [name] and follow-up for lab work in 6 months.      Relevant Medications   bictegravir-emtricitabine-tenofovir AF (BIKTARVY) 50-200-25 MG TABS tablet   darunavir-cobicistat ([name]) 800-150 MG tablet   Other Relevant Orders   T-helper cells (CD4) count (not at [facility])   HIV-1 RNA quant-no reflex-bld   CBC   Comprehensive metabolic panel   RPR   CIGARETTE SMOKER    I talked to him again about the importance of cutting down but currently he does not feel that he has the ability to stop smoking.      Phantom limb syndrome with  pain ([place])    He has chronic phantom limb pain following amputation of his right leg many years ago.  I told him that if he ever has difficulty getting his pain medication filled to let us know right away.         Michel Bickers, MD [facility] [phone] pager   [phone] cell 05/10/2022, 10:20 AM

## 2022-05-16 ENCOUNTER — Telehealth: Payer: Self-pay

## 2022-05-16 ENCOUNTER — Other Ambulatory Visit: Payer: Self-pay | Admitting: Internal Medicine

## 2022-05-16 NOTE — Telephone Encounter (Signed)
Patient is calling requesting refills on morphine and oxycodone. Patient would like refills sent to [facility].  Diannia Hogenson T Brooks Sailors

## 2022-05-17 ENCOUNTER — Other Ambulatory Visit: Payer: Self-pay | Admitting: Internal Medicine

## 2022-05-17 ENCOUNTER — Other Ambulatory Visit (HOSPITAL_COMMUNITY): Payer: Self-pay

## 2022-05-17 DIAGNOSIS — G894 Chronic pain syndrome: Secondary | ICD-10-CM

## 2022-05-17 MED ORDER — OXYCODONE HCL 5 MG PO TABS
5.0000 mg | ORAL_TABLET | Freq: Two times a day (BID) | ORAL | 0 refills | Status: DC | PRN
  Filled 2022-05-29: qty 60, 30d supply, fill #0

## 2022-05-17 MED ORDER — MORPHINE SULFATE ER 30 MG PO TBCR
90.0000 mg | EXTENDED_RELEASE_TABLET | Freq: Three times a day (TID) | ORAL | 0 refills | Status: DC
  Filled 2022-05-19: qty 199, 22d supply, fill #0

## 2022-05-18 ENCOUNTER — Other Ambulatory Visit (HOSPITAL_COMMUNITY): Payer: Self-pay

## 2022-05-19 ENCOUNTER — Other Ambulatory Visit (HOSPITAL_COMMUNITY): Payer: Self-pay

## 2022-05-22 ENCOUNTER — Other Ambulatory Visit: Payer: Self-pay | Admitting: Pharmacist

## 2022-05-22 ENCOUNTER — Ambulatory Visit (INDEPENDENT_AMBULATORY_CARE_PROVIDER_SITE_OTHER): Payer: Medicare Other | Admitting: Pharmacist

## 2022-05-22 DIAGNOSIS — Z86718 Personal history of other venous thrombosis and embolism: Secondary | ICD-10-CM

## 2022-05-22 DIAGNOSIS — Z7901 Long term (current) use of anticoagulants: Secondary | ICD-10-CM | POA: Diagnosis not present

## 2022-05-22 LAB — POCT INR: INR: 2 (ref 2.0–3.0)

## 2022-05-22 MED ORDER — WARFARIN SODIUM 5 MG PO TABS: ORAL_TABLET | ORAL | 0 refills | Status: DC

## 2022-05-22 NOTE — Progress Notes (Signed)
Anticoagulation Management Dat [name] is a 61 y.o. male who reports to the clinic for monitoring of warfarin treatment.    Indication: DVT, History of; Long term current use of oral anticoagulant, warfarin, target INR 2.0 - 3.0.  Duration: indefinite Supervising physician:  Lottie Mussel, MD  Anticoagulation Clinic Visit History: Patient does not report signs/symptoms of bleeding or thromboembolism  Other recent changes: No diet, medications, lifestyle changes cited by the patient at this visit.  Anticoagulation Episode Summary     Current INR goal:  2.0-3.0  TTR:  78.4 % (11.3 y)  Next INR check:  07/10/2022  INR from last check:  2.0 (05/22/2022)  Weekly max warfarin dose:    Target end date:  Indefinite  INR check location:  Anticoagulation Clinic  Preferred lab:    Send INR reminders to:  ANT[facility]   Indications   History of DVT (deep vein thrombosis) [Z86.718] Long term current use of anticoagulant [Z79.01]        Comments:          Anticoagulation Care Providers     Provider Role Specialty Phone number   Michel Bickers, MD  Infectious Diseases [phone]       Allergies  Allergen Reactions   Dapsone     REACTION: fever   Megestrol    Sulfa Antibiotics Other (See Comments)    "put's me down bad"   Sulfamethoxazole-Trimethoprim     REACTION: fever    Current Outpatient Medications:    ASPIRIN 81 PO, Take 81 mg by mouth daily., Disp: , Rfl:    atorvastatin (LIPITOR) 40 MG tablet, Take 1 tablet (40 mg total) by mouth daily., Disp: 90 tablet, Rfl: 3   bictegravir-emtricitabine-tenofovir AF (BIKTARVY) 50-200-25 MG TABS tablet, Take 1 tablet by mouth daily., Disp: 30 tablet, Rfl: 11   darunavir-cobicistat ([name]) 800-150 MG tablet, TAKE 1 TABLET BY MOUTH EVERY DAY DO NOT CRUSH BREAK OR CHEW TABLETS TAKE WITH FOOD, Disp: 30 tablet, Rfl: 11   Ensure (ENSURE), Take 1 Can by mouth 4 (four) times daily - after meals and at bedtime., Disp: 237 mL, Rfl: 11    gabapentin (NEURONTIN) 400 MG capsule, TAKE 1 CAPSULE(400 MG) BY MOUTH FOUR TIMES DAILY, Disp: 360 capsule, Rfl: 3   morphine (MS CONTIN) 30 MG 12 hr tablet, Take 3 tablets (90 mg total) by mouth 3 (three) times daily., Disp: 199 tablet, Rfl: 0   [START ON 05/26/2022] oxyCODONE (OXY IR/ROXICODONE) 5 MG immediate release tablet, Take 1 tablet (5 mg total) by mouth 2 (two) times daily as needed for severe pain., Disp: 60 tablet, Rfl: 0   warfarin (COUMADIN) 5 MG tablet, TAKE 1 AND 1/2 TABLET ON TUESDAY, THURSDAY AND SATURDAY. THEN 1 TABLET ON ALL OTHER DAYS (Patient taking differently: TAKE 1 AND 1/2 TABLET ON  MONDAYS,TUESDAY, THURSDAY AND SATURDAY. THEN 1 TABLET ON ALL OTHER DAYS), Disp: 36 tablet, Rfl: 0   pantoprazole (PROTONIX) 40 MG tablet, TAKE 1 TABLET(40 MG) BY MOUTH DAILY (Patient not taking: Reported on 05/10/2022), Disp: 90 tablet, Rfl: 0 Past Medical History:  Diagnosis Date   Chronic kidney disease    History of chicken pox    History of DVT of lower extremity    right   HIV (human immunodeficiency virus infection) ([place])    Hyperlipidemia    Left hip pain 03/09/2013   Pneumonia    Protein malnutrition ([place]) 08/2015   Social History   Socioeconomic History   Marital status: Widowed    Spouse  name: Not on file   Number of children: 3   Years of education: Not on file   Highest education level: Not on file  Occupational History    Employer: UNEMPLOYED  Tobacco Use   Smoking status: Every Day    Packs/day: 1.00    Years: 36.00    Total pack years: 36.00    Types: Cigarettes   Smokeless tobacco: Never   Tobacco comments:    1 pk a day   Vaping Use   Vaping Use: Never used  Substance and Sexual Activity   Alcohol use: No    Alcohol/week: 0.0 standard drinks of alcohol   Drug use: Not Currently    Frequency: 2.0 times per week    Types: Marijuana   Sexual activity: Not Currently    Comment: denied condoms; states abstinent since wife passed a long time ago  Other  Topics Concern   Not on file  Social History Narrative   Not on file   Social Determinants of Health   Financial Resource Strain: Not on file  Food Insecurity: Not on file  Transportation Needs: Not on file  Physical Activity: Not on file  Stress: Not on file  Social Connections: Not on file   Family History  Problem Relation Age of Onset   Arthritis Mother    Kidney disease Maternal Grandfather     ASSESSMENT Recent Results: The most recent result is correlated with 42.5 mg per week: Lab Results  Component Value Date   INR 2.0 05/22/2022   INR 2.4 03/20/2022   INR 3.1 (A) 12/26/2021   PROTIME 38.4 (H) 04/01/2012    Anticoagulation Dosing: Description   Take 1 & 1/2 tablets of your peach-colored '5mg'$  strength warfarin tablets on Mondays, Tuesdays, Thursdays and Saturdays.  On all other days, Wednesdays ,Fridays,  and Sundays, take ONLY 1 tablet.      INR today: Therapeutic  PLAN Weekly dose was increased by 6% to 45 mg per week  Patient Instructions  Patient instructed to take medications as defined in the Anti-coagulation Track section of this encounter.  Patient instructed to take today's dose.  Patient instructed to take 1 & 1/2 tablets of your peach-colored '5mg'$  strength warfarin tablets on Mondays, Tuesdays, Thursdays and Saturdays.  On all other days, Wednesdays ,Fridays,  and Sundays, take ONLY 1 tablet.  Patient verbalized understanding of these instructions.   Patient advised to contact clinic or seek medical attention if signs/symptoms of bleeding or thromboembolism occur.  Patient verbalized understanding by repeating back information and was advised to contact me if further medication-related questions arise. Patient was also provided an information handout.  Follow-up Return in 7 weeks (on 07/10/2022) for Follow up INR.  [name], PharmD, CPP  15 minutes spent face-to-face with the patient during the encounter. 50% of time spent on education,  including signs/sx bleeding and clotting, as well as food and drug interactions with warfarin. 50% of time was spent on fingerprick POC INR sample collection,processing, results determination, and documentation in [email or web address].net/.

## 2022-05-22 NOTE — Patient Instructions (Signed)
Patient instructed to take medications as defined in the Anti-coagulation Track section of this encounter.  Patient instructed to take today's dose.  Patient instructed to take 1 & 1/2 tablets of your peach-colored '5mg'$  strength warfarin tablets on Mondays, Tuesdays, Thursdays and Saturdays.  On all other days, Wednesdays ,Fridays,  and Sundays, take ONLY 1 tablet.  Patient verbalized understanding of these instructions.

## 2022-05-25 ENCOUNTER — Telehealth: Payer: Self-pay

## 2022-05-25 ENCOUNTER — Other Ambulatory Visit: Payer: Self-pay | Admitting: Internal Medicine

## 2022-05-25 ENCOUNTER — Other Ambulatory Visit (HOSPITAL_COMMUNITY): Payer: Self-pay

## 2022-05-25 NOTE — Telephone Encounter (Signed)
Patient called office stating he only received 199 tablets of his Morphine from [facility]. States he is supposed to be getting 270 tablets. Would like provider to send in prescription before today ends to get it filled tomorrow with oxycodone rx. Leatrice Jewels, RMA

## 2022-05-25 NOTE — Telephone Encounter (Signed)
Relayed to Jayron that Dr. Maricarmen Braziel Salon is not able to send more morphine until 8/10, but that he has set a reminder to do so. Patient verbalized understanding and has no further questions.   [name], RN

## 2022-05-25 NOTE — Telephone Encounter (Signed)
Called patient to update him on prescription. Understands that Dr. Megan Salon will send in remainder of morphine rx on 8/10. Would like to know what he should do if he finishes prescription before this date. Advised he call office if that happens, but for now provider will sent in rx later in August. Verbalized understanding. Leatrice Jewels, RMA

## 2022-05-29 ENCOUNTER — Other Ambulatory Visit (HOSPITAL_COMMUNITY): Payer: Self-pay

## 2022-06-08 ENCOUNTER — Other Ambulatory Visit (HOSPITAL_COMMUNITY): Payer: Self-pay

## 2022-06-08 ENCOUNTER — Other Ambulatory Visit: Payer: Self-pay | Admitting: Internal Medicine

## 2022-06-08 MED ORDER — MORPHINE SULFATE ER 30 MG PO TBCR
90.0000 mg | EXTENDED_RELEASE_TABLET | Freq: Three times a day (TID) | ORAL | 0 refills | Status: DC
  Filled 2022-06-08: qty 71, 8d supply, fill #0

## 2022-06-15 ENCOUNTER — Telehealth: Payer: Self-pay

## 2022-06-15 NOTE — Telephone Encounter (Signed)
Patient is calling requesting a refill on his Morphine.

## 2022-06-16 ENCOUNTER — Other Ambulatory Visit: Payer: Self-pay | Admitting: Family

## 2022-06-16 ENCOUNTER — Other Ambulatory Visit (HOSPITAL_COMMUNITY): Payer: Self-pay

## 2022-06-16 MED ORDER — MORPHINE SULFATE ER 30 MG PO TBCR
90.0000 mg | EXTENDED_RELEASE_TABLET | Freq: Three times a day (TID) | ORAL | 0 refills | Status: DC
  Filled 2022-06-16: qty 199, 22d supply, fill #0

## 2022-06-16 NOTE — Progress Notes (Signed)
Patient aware. Refill has been sent to [facility].

## 2022-06-16 NOTE — Progress Notes (Signed)
Received notice that a partial refill of MS contin was received and this is the remainder of the 30 day supply.   Terri Piedra, NP 06/16/2022 11:22 AM

## 2022-06-20 ENCOUNTER — Other Ambulatory Visit: Payer: Self-pay | Admitting: Internal Medicine

## 2022-06-27 ENCOUNTER — Other Ambulatory Visit (HOSPITAL_COMMUNITY): Payer: Self-pay

## 2022-06-27 ENCOUNTER — Other Ambulatory Visit: Payer: Self-pay | Admitting: Internal Medicine

## 2022-06-27 ENCOUNTER — Telehealth: Payer: Self-pay

## 2022-06-27 ENCOUNTER — Telehealth: Payer: Self-pay | Admitting: Pharmacist

## 2022-06-27 DIAGNOSIS — G894 Chronic pain syndrome: Secondary | ICD-10-CM

## 2022-06-27 MED ORDER — OXYCODONE HCL 5 MG PO TABS
5.0000 mg | ORAL_TABLET | Freq: Two times a day (BID) | ORAL | 0 refills | Status: DC | PRN
  Filled 2022-06-28: qty 18, 9d supply, fill #0
  Filled ????-??-??: fill #0

## 2022-06-27 MED ORDER — MORPHINE SULFATE ER 30 MG PO TBCR
90.0000 mg | EXTENDED_RELEASE_TABLET | Freq: Three times a day (TID) | ORAL | 0 refills | Status: DC
  Filled 2022-07-07: qty 270, 30d supply, fill #0

## 2022-06-27 MED ORDER — OXYCODONE HCL 5 MG PO TABS
5.0000 mg | ORAL_TABLET | Freq: Two times a day (BID) | ORAL | 0 refills | Status: DC | PRN
  Filled 2022-07-07: qty 60, 30d supply, fill #0

## 2022-06-27 NOTE — Telephone Encounter (Signed)
Patient called requesting refill of oxycodone 5 mg tablets, last sent to [facility]. Will route to provider.   [name], RN

## 2022-06-27 NOTE — Telephone Encounter (Signed)
[name] is having to fill his morphine and oxycodone at different times due to partial fills in the past. He is one week off. He is due for an oxycodone refill now but cannot fill his morphine until 07/07/22. Will have Dr. Megan Salon send in enough oxycodone to last him until 07/07/22 and then send in a month's worth on 07/07/22 along with a new morphine prescription as well so that he can be on the same schedule with refilling both. Verified this with Butch Penny and Cameron at [facility]. Called Kal to explain and he is on board with plan. All questions answered.  Beecher Furio [employer], PharmD, BCIDP, AAHIVP, CPP Clinical Pharmacist Practitioner Infectious Diseases [facility] 06/27/2022, 3:04 PM

## 2022-06-28 ENCOUNTER — Other Ambulatory Visit (HOSPITAL_COMMUNITY): Payer: Self-pay

## 2022-06-28 ENCOUNTER — Other Ambulatory Visit: Payer: Self-pay

## 2022-06-28 DIAGNOSIS — B20 Human immunodeficiency virus [HIV] disease: Secondary | ICD-10-CM

## 2022-06-28 MED ORDER — PREZCOBIX 800-150 MG PO TABS
ORAL_TABLET | ORAL | 11 refills | Status: DC
  Filled 2022-06-28: qty 30, fill #0
  Filled 2022-06-28 – 2022-06-29 (×3): qty 30, 30d supply, fill #0
  Filled 2022-07-25: qty 30, 30d supply, fill #1
  Filled 2022-08-17: qty 30, 30d supply, fill #2
  Filled 2022-09-22: qty 30, 30d supply, fill #3
  Filled 2022-10-13: qty 30, 30d supply, fill #4
  Filled 2022-11-13: qty 30, 30d supply, fill #5

## 2022-06-29 ENCOUNTER — Telehealth: Payer: Self-pay

## 2022-06-29 ENCOUNTER — Other Ambulatory Visit (HOSPITAL_COMMUNITY): Payer: Self-pay

## 2022-06-29 NOTE — Telephone Encounter (Signed)
Patient called office today stating he is having issues filling [name] at [facility]. Called pharmacy for additional information per tech patient had prescription filled on 8/30 at a different pharmacy. Insurance will not approve refill due to this. Per refill history rx filled at [facility]. Called [facility] back and requested he call [facility] to either fill his prescription or cancel it and have [facility] fill it. Advised patient try to fill medication at one pharmacy to avoid additional issues like this in the future. Verbalized understanding. Will call office if he is still not able to get his medicine. Leatrice Jewels, RMA

## 2022-07-06 ENCOUNTER — Other Ambulatory Visit: Payer: Self-pay | Admitting: Internal Medicine

## 2022-07-07 ENCOUNTER — Other Ambulatory Visit (HOSPITAL_COMMUNITY): Payer: Self-pay

## 2022-07-10 ENCOUNTER — Other Ambulatory Visit (HOSPITAL_COMMUNITY): Payer: Self-pay

## 2022-07-10 ENCOUNTER — Ambulatory Visit (INDEPENDENT_AMBULATORY_CARE_PROVIDER_SITE_OTHER): Payer: Medicare Other | Admitting: Pharmacist

## 2022-07-10 DIAGNOSIS — Z7901 Long term (current) use of anticoagulants: Secondary | ICD-10-CM

## 2022-07-10 DIAGNOSIS — Z86718 Personal history of other venous thrombosis and embolism: Secondary | ICD-10-CM

## 2022-07-10 LAB — POCT INR: INR: 2.3 (ref 2.0–3.0)

## 2022-07-10 NOTE — Patient Instructions (Signed)
Patient instructed to take medications as defined in the Anti-coagulation Track section of this encounter.  Patient instructed to take today's dose.  Patient instructed to take one and one half (1 & 1/2) of your '5mg'$  peach-colored warfarin tablets on Mondays, Tuesdays, Thursdays and Saturdays.ALL OTHER DAYS, take only one (1) tablet of your '5mg'$  peach-colored warfarin tablets. Patient verbalized understanding of these instructions.

## 2022-07-10 NOTE — Progress Notes (Signed)
Anticoagulation Management [name] is a 61 y.o. male who reports to the clinic for monitoring of warfarin treatment.    Indication: DVT , history of; long term current use of oral anticoagulant, warfarin to maintain INR 2.0 - 3.0.  Duration: indefinite Supervising physician: [facility] Visit History: Patient does not report signs/symptoms of bleeding or thromboembolism  Other recent changes: No diet, medications, lifestyle changes cited.  Anticoagulation Episode Summary     Current INR goal:  2.0-3.0  TTR:  78.6 % (11.4 y)  Next INR check:  09/04/2022  INR from last check:  2.3 (07/10/2022)  Weekly max warfarin dose:    Target end date:  Indefinite  INR check location:  Anticoagulation Clinic  Preferred lab:    Send INR reminders to:  [facility]   Indications   History of DVT (deep vein thrombosis) [Z86.718] Long term current use of anticoagulant [Z79.01]        Comments:          Anticoagulation Care Providers     Provider Role Specialty Phone number   Michel Bickers, MD  Infectious Diseases [phone]       Allergies  Allergen Reactions   Dapsone     REACTION: fever   Megestrol    Sulfa Antibiotics Other (See Comments)    "put's me down bad"   Sulfamethoxazole-Trimethoprim     REACTION: fever    Current Outpatient Medications:    ASPIRIN 81 PO, Take 81 mg by mouth daily., Disp: , Rfl:    atorvastatin (LIPITOR) 40 MG tablet, Take 1 tablet (40 mg total) by mouth daily., Disp: 90 tablet, Rfl: 3   bictegravir-emtricitabine-tenofovir AF (BIKTARVY) 50-200-25 MG TABS tablet, Take 1 tablet by mouth daily., Disp: 30 tablet, Rfl: 11   darunavir-cobicistat ([name]) 800-150 MG tablet, TAKE 1 TABLET BY MOUTH EVERY DAY DO NOT CRUSH BREAK OR CHEW TABLETS TAKE WITH FOOD, Disp: 30 tablet, Rfl: 11   Ensure (ENSURE), Take 1 Can by mouth 4 (four) times daily - after meals and at bedtime., Disp: 237 mL, Rfl: 11   gabapentin (NEURONTIN) 400 MG  capsule, TAKE 1 CAPSULE(400 MG) BY MOUTH FOUR TIMES DAILY, Disp: 360 capsule, Rfl: 3   morphine (MS CONTIN) 30 MG 12 hr tablet, Take 3 tablets by mouth 3 times daily. (07/07/22), Disp: 270 tablet, Rfl: 0   oxyCODONE (OXY IR/ROXICODONE) 5 MG immediate release tablet, Take 1 tablet by mouth 2 times daily as needed for severe pain. (07/07/22), Disp: 60 tablet, Rfl: 0   pantoprazole (PROTONIX) 40 MG tablet, TAKE 1 TABLET(40 MG) BY MOUTH DAILY, Disp: 90 tablet, Rfl: 0   warfarin (COUMADIN) 5 MG tablet, TAKE 1 AND 1/2 TABLET ON  MONDAY,TUESDAY, THURSDAY AND SATURDAY. THEN 1 TABLET ON ALL OTHER DAYS, Disp: 36 tablet, Rfl: 0   oxyCODONE (OXY IR/ROXICODONE) 5 MG immediate release tablet, Take 1 tablet by mouth 2 times daily as needed for up to 9 days for severe pain., Disp: 18 tablet, Rfl: 0 Past Medical History:  Diagnosis Date   Chronic kidney disease    History of chicken pox    History of DVT of lower extremity    right   HIV (human immunodeficiency virus infection) ([place])    Hyperlipidemia    Left hip pain 03/09/2013   Pneumonia    Protein malnutrition ([place]) 08/2015   Social History   Socioeconomic History   Marital status: Widowed    Spouse name: Not on file   Number of  children: 3   Years of education: Not on file   Highest education level: Not on file  Occupational History    Employer: UNEMPLOYED  Tobacco Use   Smoking status: Every Day    Packs/day: 1.00    Years: 36.00    Total pack years: 36.00    Types: Cigarettes   Smokeless tobacco: Never   Tobacco comments:    1 pk a day   Vaping Use   Vaping Use: Never used  Substance and Sexual Activity   Alcohol use: No    Alcohol/week: 0.0 standard drinks of alcohol   Drug use: Not Currently    Frequency: 2.0 times per week    Types: Marijuana   Sexual activity: Not Currently    Comment: denied condoms; states abstinent since wife passed a long time ago  Other Topics Concern   Not on file  Social History Narrative   Not on  file   Social Determinants of Health   Financial Resource Strain: Not on file  Food Insecurity: Not on file  Transportation Needs: Not on file  Physical Activity: Not on file  Stress: Not on file  Social Connections: Not on file   Family History  Problem Relation Age of Onset   Arthritis Mother    Kidney disease Maternal Grandfather     ASSESSMENT Recent Results: The most recent result is correlated with 45 mg per week: Lab Results  Component Value Date   INR 2.3 07/10/2022   INR 2.0 05/22/2022   INR 2.4 03/20/2022   PROTIME 38.4 (H) 04/01/2012    Anticoagulation Dosing: Description   Take 1 & 1/2 tablets of your peach-colored '5mg'$  strength warfarin tablets on Mondays, Tuesdays, Thursdays and Saturdays.  On all other days, Wednesdays ,Fridays,  and Sundays, take ONLY 1 tablet.      INR today: Therapeutic  PLAN Weekly dose was unchanged.   Patient Instructions  Patient instructed to take medications as defined in the Anti-coagulation Track section of this encounter.  Patient instructed to take today's dose.  Patient instructed to take one and one half (1 & 1/2) of your '5mg'$  peach-colored warfarin tablets on Mondays, Tuesdays, Thursdays and Saturdays.ALL OTHER DAYS, take only one (1) tablet of your '5mg'$  peach-colored warfarin tablets. Patient verbalized understanding of these instructions.   Patient advised to contact clinic or seek medical attention if signs/symptoms of bleeding or thromboembolism occur.  Patient verbalized understanding by repeating back information and was advised to contact me if further medication-related questions arise. Patient was also provided an information handout.  Follow-up Return in 8 weeks (on 09/04/2022) for Follow up INR.  [name], PharmD, CPP  15 minutes spent face-to-face with the patient during the encounter. 50% of time spent on education, including signs/sx bleeding and clotting, as well as food and drug interactions with  warfarin. 50% of time was spent on fingerprick POC INR sample collection,processing, results determination, and documentation in [email or web address]/.

## 2022-07-13 ENCOUNTER — Other Ambulatory Visit: Payer: Self-pay | Admitting: Pharmacist

## 2022-07-25 ENCOUNTER — Other Ambulatory Visit (HOSPITAL_COMMUNITY): Payer: Self-pay

## 2022-07-25 ENCOUNTER — Other Ambulatory Visit: Payer: Self-pay | Admitting: Pharmacist

## 2022-07-25 DIAGNOSIS — B20 Human immunodeficiency virus [HIV] disease: Secondary | ICD-10-CM

## 2022-07-25 MED ORDER — BIKTARVY 50-200-25 MG PO TABS
1.0000 | ORAL_TABLET | Freq: Every day | ORAL | 11 refills | Status: DC
  Filled 2022-07-25: qty 30, 30d supply, fill #0
  Filled 2022-08-17: qty 30, 30d supply, fill #1
  Filled 2022-09-22: qty 30, 30d supply, fill #2
  Filled 2022-10-13: qty 30, 30d supply, fill #3
  Filled 2022-11-13: qty 30, 30d supply, fill #4

## 2022-07-26 ENCOUNTER — Other Ambulatory Visit (HOSPITAL_COMMUNITY): Payer: Self-pay

## 2022-07-27 ENCOUNTER — Other Ambulatory Visit (HOSPITAL_COMMUNITY): Payer: Self-pay

## 2022-08-01 ENCOUNTER — Other Ambulatory Visit: Payer: Self-pay | Admitting: Internal Medicine

## 2022-08-03 ENCOUNTER — Other Ambulatory Visit: Payer: Self-pay | Admitting: Internal Medicine

## 2022-08-03 DIAGNOSIS — G894 Chronic pain syndrome: Secondary | ICD-10-CM

## 2022-08-03 MED ORDER — MORPHINE SULFATE ER 30 MG PO TBCR: 90.0000 mg | EXTENDED_RELEASE_TABLET | Freq: Three times a day (TID) | ORAL | 0 refills | Status: DC

## 2022-08-03 MED ORDER — OXYCODONE HCL 5 MG PO TABS: 5.0000 mg | ORAL_TABLET | Freq: Two times a day (BID) | ORAL | 0 refills | Status: DC | PRN

## 2022-08-09 ENCOUNTER — Telehealth: Payer: Self-pay

## 2022-08-09 ENCOUNTER — Other Ambulatory Visit: Payer: Self-pay | Admitting: Internal Medicine

## 2022-08-09 ENCOUNTER — Other Ambulatory Visit (HOSPITAL_COMMUNITY): Payer: Self-pay

## 2022-08-09 DIAGNOSIS — G894 Chronic pain syndrome: Secondary | ICD-10-CM

## 2022-08-09 MED ORDER — OXYCODONE HCL 5 MG PO TABS
5.0000 mg | ORAL_TABLET | Freq: Two times a day (BID) | ORAL | 0 refills | Status: DC | PRN
  Filled 2022-08-09 (×3): qty 60, 30d supply, fill #0

## 2022-08-09 MED ORDER — MORPHINE SULFATE ER 30 MG PO TBCR
90.0000 mg | EXTENDED_RELEASE_TABLET | Freq: Three times a day (TID) | ORAL | 0 refills | Status: DC
  Filled 2022-08-09 (×5): qty 270, 30d supply, fill #0

## 2022-08-09 NOTE — Telephone Encounter (Signed)
Patient called requesting pain medications be sent to [facility]. Says he has difficulty getting his medications from other pharmacies.   [name], RN

## 2022-08-09 NOTE — Telephone Encounter (Signed)
-----   Message from Michel Bickers, MD sent at 08/09/2022  9:51 AM EDT ----- Regarding: RE: Pain Medications I sent refills last week. ----- Message ----- From: Tomi Bamberger, CMA Sent: 08/08/2022   9:19 AM EDT To: Michel Bickers, MD Subject: Pain Medications                               Patient called morphine and oxycodone need to be sent to [place]. I have removed the [facility] in [place] out of his chart. I advised patient when sent in we will call him back and let him know,

## 2022-08-09 NOTE — Telephone Encounter (Signed)
Patient notified pain medications have been sent to [facility].    [place], CMA

## 2022-08-10 ENCOUNTER — Other Ambulatory Visit (HOSPITAL_COMMUNITY): Payer: Self-pay

## 2022-08-14 NOTE — Progress Notes (Deleted)
CC: routine f/u visit  HPI:  [name] is a 61 y.o. male with past medical history of HLD, HIV, GERD, avascular necrosis of the femoral head (2016), prior DVT (on warfarin), and pulmonary nodule (2010) that presents for a routine f/u visit.    Allergies as of 08/14/2022       Reactions   Dapsone    REACTION: fever   Megestrol    Sulfa Antibiotics Other (See Comments)   "put's me down bad"   Sulfamethoxazole-trimethoprim    REACTION: fever        Medication List        Accurate as of August 14, 2022  7:09 AM. If you have any questions, ask your nurse or doctor.          ASPIRIN 81 PO Take 81 mg by mouth daily.   atorvastatin 40 MG tablet Commonly known as: Lipitor Take 1 tablet (40 mg total) by mouth daily.   Biktarvy 50-200-25 MG Tabs tablet Generic drug: bictegravir-emtricitabine-tenofovir AF Take 1 tablet by mouth daily.   Ensure Take 1 Can by mouth 4 (four) times daily - after meals and at bedtime.   gabapentin 400 MG capsule Commonly known as: NEURONTIN TAKE 1 CAPSULE(400 MG) BY MOUTH FOUR TIMES DAILY   morphine 30 MG 12 hr tablet Commonly known as: MS CONTIN Take 3 tablets (90 mg total) by mouth 3 (three) times daily.   oxyCODONE 5 MG immediate release tablet Commonly known as: Oxy IR/ROXICODONE Take 1 tablet (5 mg total) by mouth 2 (two) times daily as needed for severe pain.   pantoprazole 40 MG tablet Commonly known as: PROTONIX TAKE 1 TABLET(40 MG) BY MOUTH DAILY   [name] 800-150 MG tablet Generic drug: darunavir-cobicistat TAKE 1 TABLET BY MOUTH EVERY DAY DO NOT CRUSH BREAK OR CHEW TABLETS TAKE WITH FOOD   warfarin 5 MG tablet Commonly known as: COUMADIN Take as directed by the anticoagulation clinic. If you are unsure how to take this medication, talk to your nurse or doctor. Original instructions: TAKE 1 AND 1/2 TABLETS BY MOUTH ON MONDAY, TUESDAY, THURSDAY, AND SATURDAY. THEN 1 TABLET ON ALL OTHER DAYS         Past  Medical History:  Diagnosis Date   Chronic kidney disease    History of chicken pox    History of DVT of lower extremity    right   HIV (human immunodeficiency virus infection) ([place])    Hyperlipidemia    Left hip pain 03/09/2013   Pneumonia    Protein malnutrition ([place]) 08/2015   Review of Systems:  per HPI.   Physical Exam: *** There were no vitals filed for this visit.  *** Constitutional: Well-developed, well-nourished, appears comfortable  HENT: Normocephalic and atraumatic.  Eyes: EOM are normal. PERRL.  Neck: Normal range of motion.  Cardiovascular: Regular rate, regular rhythm. No murmurs, rubs, or gallops. Normal radial and PT pulses bilaterally. No LE edema.  Pulmonary: Normal respiratory effort. No wheezes, rales, or rhonchi.   Abdominal: Soft. Non-distended. No tenderness. Normal bowel sounds.  Musculoskeletal: Normal range of motion.     Neurological: Alert and oriented to person, place, and time. Non-focal. Skin: warm and dry.    Assessment & Plan:   ?: ***  Avascular necrosis of the femoral head (2016), - Current medications include MS Contin 90 mg TID, oxycodone 5 mg BID, and gabapentin 400 mg QID. Patient receives pain medications from [facility] (Dr. Megan Salon). Pain today ***.   Plan: - Continue  MS Contin 90 mg TID, oxycodone 5 mg BID, and gabapentin 400 mg QID   2. pulmonary nodule (2010) - CT chest in 2010 demonstrated multiple ground-glass nodules that were thought to be less likely malignancy. No repeat CT since then. Repeat CT was ordered at previous visit in 12/2021 but has not yet been completed.   Plan: - Repeat CT chest - Counseled on smoking cessation  3. HLD - Current medications include atorvastatin 40 MG. Patient states that he is *** compliant with this medication. Lipid panel from 11/2021 WNL w/ exception of total cholesterol 224 and LDL 154.     Plan: - Repeat lipid panel at next visit  4. Health Screening: - Influenza vaccine (),  colonoscopy () - Medication refill?  Plan: -   See Encounters Tab for problem based charting.  Patient seen with Dr. Barbaraann Boys

## 2022-08-15 ENCOUNTER — Other Ambulatory Visit (HOSPITAL_COMMUNITY): Payer: Self-pay

## 2022-08-17 ENCOUNTER — Other Ambulatory Visit (HOSPITAL_COMMUNITY): Payer: Self-pay

## 2022-08-24 ENCOUNTER — Other Ambulatory Visit (HOSPITAL_COMMUNITY): Payer: Self-pay

## 2022-09-04 ENCOUNTER — Ambulatory Visit (INDEPENDENT_AMBULATORY_CARE_PROVIDER_SITE_OTHER): Payer: Medicare Other | Admitting: Pharmacist

## 2022-09-04 DIAGNOSIS — Z86718 Personal history of other venous thrombosis and embolism: Secondary | ICD-10-CM

## 2022-09-04 DIAGNOSIS — Z7901 Long term (current) use of anticoagulants: Secondary | ICD-10-CM

## 2022-09-04 LAB — POCT INR: INR: 2.2 (ref 2.0–3.0)

## 2022-09-04 MED ORDER — WARFARIN SODIUM 5 MG PO TABS: ORAL_TABLET | ORAL | 0 refills | Status: DC

## 2022-09-04 NOTE — Patient Instructions (Signed)
Patient instructed to take medications as defined in the Anti-coagulation Track section of this encounter.  Patient instructed to take today's dose.  Patient instructed to take 1 & 1/2 tablets of your peach-colored '5mg'$  strength warfarin tablets Monday through Friday of every week. On Saturdays and Sundays, take ONLY one (1) tablet.  Patient verbalized understanding of these instructions.

## 2022-09-04 NOTE — Progress Notes (Signed)
Anticoagulation Management [name] is a 61 y.o. male who reports to the clinic for monitoring of warfarin treatment.    Indication: DVT, History of; Long term current use of oral anticoagulant, warfarin. Target INR range 2.0 - 3.0.  Duration: indefinite Supervising physician: [facility] Visit History: Patient does not report signs/symptoms of bleeding or thromboembolism  Other recent changes: No diet, medications, lifestyle changes. f Anticoagulation Episode Summary     Current INR goal:  2.0-3.0  TTR:  78.9 % (11.6 y)  Next INR check:  11/27/2022  INR from last check:  2.2 (09/04/2022)  Weekly max warfarin dose:    Target end date:  Indefinite  INR check location:  Anticoagulation Clinic  Preferred lab:    Send INR reminders to:  [facility]   Indications   History of DVT (deep vein thrombosis) [Z86.718] Long term current use of anticoagulant [Z79.01]        Comments:          Anticoagulation Care Providers     Provider Role Specialty Phone number   Michel Bickers, MD  Infectious Diseases [phone]       Allergies  Allergen Reactions   Dapsone     REACTION: fever   Megestrol    Sulfa Antibiotics Other (See Comments)    "put's me down bad"   Sulfamethoxazole-Trimethoprim     REACTION: fever    Current Outpatient Medications:    ASPIRIN 81 PO, Take 81 mg by mouth daily., Disp: , Rfl:    atorvastatin (LIPITOR) 40 MG tablet, Take 1 tablet (40 mg total) by mouth daily., Disp: 90 tablet, Rfl: 3   bictegravir-emtricitabine-tenofovir AF (BIKTARVY) 50-200-25 MG TABS tablet, Take 1 tablet by mouth daily., Disp: 30 tablet, Rfl: 11   darunavir-cobicistat ([name]) 800-150 MG tablet, TAKE 1 TABLET BY MOUTH EVERY DAY DO NOT CRUSH BREAK OR CHEW TABLETS TAKE WITH FOOD, Disp: 30 tablet, Rfl: 11   Ensure (ENSURE), Take 1 Can by mouth 4 (four) times daily - after meals and at bedtime., Disp: 237 mL, Rfl: 11   gabapentin (NEURONTIN) 400 MG  capsule, TAKE 1 CAPSULE(400 MG) BY MOUTH FOUR TIMES DAILY, Disp: 360 capsule, Rfl: 3   morphine (MS CONTIN) 30 MG 12 hr tablet, Take 3 tablets (90 mg total) by mouth 3 (three) times daily., Disp: 270 tablet, Rfl: 0   oxyCODONE (OXY IR/ROXICODONE) 5 MG immediate release tablet, Take 1 tablet (5 mg total) by mouth 2 (two) times daily as needed for severe pain., Disp: 60 tablet, Rfl: 0   pantoprazole (PROTONIX) 40 MG tablet, TAKE 1 TABLET(40 MG) BY MOUTH DAILY, Disp: 90 tablet, Rfl: 0   warfarin (COUMADIN) 5 MG tablet, TAKE 1 AND 1/2 TABLETS BY MOUTH ON MONDAY, TUESDAY, THURSDAY, AND SATURDAY. THEN 1 TABLET ON ALL OTHER DAYS, Disp: 36 tablet, Rfl: 0 Past Medical History:  Diagnosis Date   Chronic kidney disease    History of chicken pox    History of DVT of lower extremity    right   HIV (human immunodeficiency virus infection) ([place])    Hyperlipidemia    Left hip pain 03/09/2013   Pneumonia    Protein malnutrition ([place]) 08/2015   Social History   Socioeconomic History   Marital status: Widowed    Spouse name: Not on file   Number of children: 3   Years of education: Not on file   Highest education level: Not on file  Occupational History    Employer: UNEMPLOYED  Tobacco Use   Smoking status: Every Day    Packs/day: 1.00    Years: 36.00    Total pack years: 36.00    Types: Cigarettes   Smokeless tobacco: Never   Tobacco comments:    1 pk a day   Vaping Use   Vaping Use: Never used  Substance and Sexual Activity   Alcohol use: No    Alcohol/week: 0.0 standard drinks of alcohol   Drug use: Not Currently    Frequency: 2.0 times per week    Types: Marijuana   Sexual activity: Not Currently    Comment: denied condoms; states abstinent since wife passed a long time ago  Other Topics Concern   Not on file  Social History Narrative   Not on file   Social Determinants of Health   Financial Resource Strain: Not on file  Food Insecurity: Not on file  Transportation Needs: Not  on file  Physical Activity: Not on file  Stress: Not on file  Social Connections: Not on file   Family History  Problem Relation Age of Onset   Arthritis Mother    Kidney disease Maternal Grandfather     ASSESSMENT Recent Results: The most recent result is correlated with 47.5 mg per week: Lab Results  Component Value Date   INR 2.2 09/04/2022   INR 2.3 07/10/2022   INR 2.0 05/22/2022   PROTIME 38.4 (H) 04/01/2012    Anticoagulation Dosing: Description   Take 1 & 1/2 tablets of your peach-colored '5mg'$  strength warfarin tablets Monday through Friday of every week. On Saturdays and Sundays, take ONLY one (1) tablet.       INR today: Therapeutic  PLAN Weekly dose was increased by 6% to 50 mg per week  Patient Instructions  Patient instructed to take medications as defined in the Anti-coagulation Track section of this encounter.  Patient instructed to take today's dose.  Patient instructed to take 1 & 1/2 tablets of your peach-colored '5mg'$  strength warfarin tablets Monday through Friday of every week. On Saturdays and Sundays, take ONLY one (1) tablet.  Patient verbalized understanding of these instructions.  Patient advised to contact clinic or seek medical attention if signs/symptoms of bleeding or thromboembolism occur.  Patient verbalized understanding by repeating back information and was advised to contact me if further medication-related questions arise. Patient was also provided an information handout.  Follow-up Return in 12 weeks (on 11/27/2022) for Follow up INR.  [name], PharmD, CPP  15 minutes spent face-to-face with the patient during the encounter. 50% of time spent on education, including signs/sx bleeding and clotting, as well as food and drug interactions with warfarin. 50% of time was spent on fingerprick POC INR sample collection,processing, results determination, and documentation in http://www.[email or web address]/.

## 2022-09-07 ENCOUNTER — Other Ambulatory Visit (HOSPITAL_COMMUNITY): Payer: Self-pay

## 2022-09-07 ENCOUNTER — Other Ambulatory Visit: Payer: Self-pay | Admitting: Internal Medicine

## 2022-09-07 ENCOUNTER — Telehealth: Payer: Self-pay

## 2022-09-07 DIAGNOSIS — G894 Chronic pain syndrome: Secondary | ICD-10-CM

## 2022-09-07 MED ORDER — OXYCODONE HCL 5 MG PO TABS
5.0000 mg | ORAL_TABLET | Freq: Two times a day (BID) | ORAL | 0 refills | Status: DC | PRN
  Filled 2022-09-07: qty 60, 30d supply, fill #0

## 2022-09-07 MED ORDER — MORPHINE SULFATE ER 30 MG PO TBCR
90.0000 mg | EXTENDED_RELEASE_TABLET | Freq: Three times a day (TID) | ORAL | 0 refills | Status: DC
  Filled 2022-09-07: qty 70, 8d supply, fill #0
  Filled 2022-09-07: qty 200, 22d supply, fill #0

## 2022-09-07 NOTE — Telephone Encounter (Signed)
Patient called office requesting refills for Oxycodone and Morphine, states that he only has enough for two days. Requested he call office at least one week before he is out of medication to ensure refills are sent in on time.  Will forward message to provider. Would like refills sent to [facility] Leatrice Jewels, RMA

## 2022-09-08 ENCOUNTER — Other Ambulatory Visit (HOSPITAL_COMMUNITY): Payer: Self-pay

## 2022-09-09 ENCOUNTER — Other Ambulatory Visit (HOSPITAL_COMMUNITY): Payer: Self-pay

## 2022-09-11 ENCOUNTER — Other Ambulatory Visit (HOSPITAL_COMMUNITY): Payer: Self-pay

## 2022-09-13 ENCOUNTER — Other Ambulatory Visit: Payer: Self-pay

## 2022-09-13 ENCOUNTER — Other Ambulatory Visit (HOSPITAL_COMMUNITY): Payer: Self-pay

## 2022-09-14 ENCOUNTER — Other Ambulatory Visit: Payer: Self-pay | Admitting: Pharmacist

## 2022-09-14 MED ORDER — WARFARIN SODIUM 5 MG PO TABS: ORAL_TABLET | ORAL | 0 refills | Status: DC

## 2022-09-18 ENCOUNTER — Other Ambulatory Visit (HOSPITAL_COMMUNITY): Payer: Self-pay

## 2022-09-18 ENCOUNTER — Other Ambulatory Visit: Payer: Self-pay | Admitting: Pharmacist

## 2022-09-18 ENCOUNTER — Telehealth: Payer: Self-pay | Admitting: *Deleted

## 2022-09-18 MED ORDER — WARFARIN SODIUM 5 MG PO TABS
ORAL_TABLET | ORAL | 0 refills | Status: DC
  Filled 2022-09-18: qty 120, 84d supply, fill #0
  Filled 2022-09-18: qty 120, 89d supply, fill #0

## 2022-09-18 NOTE — Telephone Encounter (Signed)
Call from [facility] from [employer] for [employer] on his Warfirn dosing.  Contacted Christell Constant, Pharmacy.  Patient to take Warfarin 5 mg 1 tablet on Saturday and Sundays.  [name] was called and informed of.

## 2022-09-22 ENCOUNTER — Other Ambulatory Visit (HOSPITAL_COMMUNITY): Payer: Self-pay

## 2022-10-04 ENCOUNTER — Other Ambulatory Visit: Payer: Self-pay | Admitting: Internal Medicine

## 2022-10-04 ENCOUNTER — Other Ambulatory Visit (HOSPITAL_COMMUNITY): Payer: Self-pay

## 2022-10-04 DIAGNOSIS — G894 Chronic pain syndrome: Secondary | ICD-10-CM

## 2022-10-04 MED ORDER — OXYCODONE HCL 5 MG PO TABS
5.0000 mg | ORAL_TABLET | Freq: Two times a day (BID) | ORAL | 0 refills | Status: DC | PRN
  Filled 2022-10-04 – 2022-10-05 (×2): qty 60, 30d supply, fill #0

## 2022-10-04 MED ORDER — MORPHINE SULFATE ER 30 MG PO TBCR
90.0000 mg | EXTENDED_RELEASE_TABLET | Freq: Three times a day (TID) | ORAL | 0 refills | Status: AC
  Filled 2022-10-04 – 2022-10-05 (×2): qty 270, 30d supply, fill #0

## 2022-10-05 ENCOUNTER — Other Ambulatory Visit (HOSPITAL_COMMUNITY): Payer: Self-pay

## 2022-10-06 ENCOUNTER — Other Ambulatory Visit (HOSPITAL_COMMUNITY): Payer: Self-pay

## 2022-10-07 ENCOUNTER — Other Ambulatory Visit (HOSPITAL_COMMUNITY): Payer: Self-pay

## 2022-10-13 ENCOUNTER — Other Ambulatory Visit: Payer: Self-pay

## 2022-10-18 ENCOUNTER — Other Ambulatory Visit: Payer: Self-pay

## 2022-10-30 ENCOUNTER — Other Ambulatory Visit: Payer: Self-pay | Admitting: Internal Medicine

## 2022-10-30 DIAGNOSIS — G629 Polyneuropathy, unspecified: Secondary | ICD-10-CM

## 2022-10-31 ENCOUNTER — Other Ambulatory Visit: Payer: Self-pay | Admitting: Internal Medicine

## 2022-10-31 ENCOUNTER — Other Ambulatory Visit: Payer: Self-pay

## 2022-10-31 DIAGNOSIS — G629 Polyneuropathy, unspecified: Secondary | ICD-10-CM

## 2022-10-31 MED ORDER — GABAPENTIN 400 MG PO CAPS
400.0000 mg | ORAL_CAPSULE | Freq: Four times a day (QID) | ORAL | 3 refills | Status: DC
  Filled 2022-10-31: qty 360, 90d supply, fill #0
  Filled 2023-02-22: qty 360, 90d supply, fill #1
  Filled 2023-07-03: qty 360, 90d supply, fill #2

## 2022-10-31 NOTE — Telephone Encounter (Signed)
Okay to refill.  Thanks.

## 2022-11-01 ENCOUNTER — Other Ambulatory Visit: Payer: Self-pay

## 2022-11-03 ENCOUNTER — Other Ambulatory Visit (HOSPITAL_COMMUNITY): Payer: Self-pay

## 2022-11-06 ENCOUNTER — Telehealth: Payer: Self-pay

## 2022-11-06 ENCOUNTER — Other Ambulatory Visit: Payer: Self-pay | Admitting: Internal Medicine

## 2022-11-06 ENCOUNTER — Other Ambulatory Visit (HOSPITAL_COMMUNITY): Payer: Self-pay

## 2022-11-06 DIAGNOSIS — G894 Chronic pain syndrome: Secondary | ICD-10-CM

## 2022-11-06 NOTE — Telephone Encounter (Signed)
Patient calling requesting refills on he morphine and oxycodone be sent [facility]

## 2022-11-07 ENCOUNTER — Other Ambulatory Visit: Payer: Self-pay | Admitting: Internal Medicine

## 2022-11-07 ENCOUNTER — Other Ambulatory Visit (HOSPITAL_COMMUNITY): Payer: Self-pay

## 2022-11-07 ENCOUNTER — Other Ambulatory Visit: Payer: Self-pay

## 2022-11-07 DIAGNOSIS — G894 Chronic pain syndrome: Secondary | ICD-10-CM

## 2022-11-07 DIAGNOSIS — G546 Phantom limb syndrome with pain: Secondary | ICD-10-CM

## 2022-11-07 MED ORDER — OXYCODONE HCL 5 MG PO TABS
5.0000 mg | ORAL_TABLET | Freq: Two times a day (BID) | ORAL | 0 refills | Status: DC | PRN
  Filled 2022-11-07 – 2022-11-08 (×4): qty 60, 30d supply, fill #0

## 2022-11-07 MED ORDER — MORPHINE SULFATE ER 30 MG PO TBCR
90.0000 mg | EXTENDED_RELEASE_TABLET | Freq: Three times a day (TID) | ORAL | 0 refills | Status: DC
  Filled 2022-11-07 – 2022-11-08 (×4): qty 270, 30d supply, fill #0

## 2022-11-08 ENCOUNTER — Other Ambulatory Visit (HOSPITAL_COMMUNITY): Payer: Self-pay

## 2022-11-08 ENCOUNTER — Other Ambulatory Visit: Payer: Self-pay

## 2022-11-08 ENCOUNTER — Other Ambulatory Visit: Payer: Medicare Other

## 2022-11-08 ENCOUNTER — Other Ambulatory Visit (HOSPITAL_BASED_OUTPATIENT_CLINIC_OR_DEPARTMENT_OTHER): Payer: Self-pay

## 2022-11-08 DIAGNOSIS — B20 Human immunodeficiency virus [HIV] disease: Secondary | ICD-10-CM

## 2022-11-09 ENCOUNTER — Other Ambulatory Visit: Payer: Self-pay

## 2022-11-09 LAB — T-HELPER CELLS (CD4) COUNT (NOT AT ARMC)
CD4 % Helper T Cell: 25 % — ABNORMAL LOW (ref 33–65)
CD4 T Cell Abs: 423 /uL (ref 400–1790)

## 2022-11-11 LAB — COMPREHENSIVE METABOLIC PANEL
AG Ratio: 1.2 (calc) (ref 1.0–2.5)
ALT: 11 U/L (ref 9–46)
AST: 16 U/L (ref 10–35)
Albumin: 4.2 g/dL (ref 3.6–5.1)
Alkaline phosphatase (APISO): 88 U/L (ref 35–144)
BUN/Creatinine Ratio: 9 (calc) (ref 6–22)
BUN: 13 mg/dL (ref 7–25)
CO2: 29 mmol/L (ref 20–32)
Calcium: 9.7 mg/dL (ref 8.6–10.3)
Chloride: 99 mmol/L (ref 98–110)
Creat: 1.4 mg/dL — ABNORMAL HIGH (ref 0.70–1.35)
Globulin: 3.5 g/dL (calc) (ref 1.9–3.7)
Glucose, Bld: 130 mg/dL — ABNORMAL HIGH (ref 65–99)
Potassium: 3.2 mmol/L — ABNORMAL LOW (ref 3.5–5.3)
Sodium: 141 mmol/L (ref 135–146)
Total Bilirubin: 0.6 mg/dL (ref 0.2–1.2)
Total Protein: 7.7 g/dL (ref 6.1–8.1)

## 2022-11-11 LAB — CBC
HCT: 51.4 % — ABNORMAL HIGH (ref 38.5–50.0)
Hemoglobin: 18.5 g/dL — ABNORMAL HIGH (ref 13.2–17.1)
MCH: 35 pg — ABNORMAL HIGH (ref 27.0–33.0)
MCHC: 36 g/dL (ref 32.0–36.0)
MCV: 97.2 fL (ref 80.0–100.0)
MPV: 10.2 fL (ref 7.5–12.5)
Platelets: 246 10*3/uL (ref 140–400)
RBC: 5.29 10*6/uL (ref 4.20–5.80)
RDW: 13.4 % (ref 11.0–15.0)
WBC: 6.4 10*3/uL (ref 3.8–10.8)

## 2022-11-11 LAB — RPR: RPR Ser Ql: NONREACTIVE

## 2022-11-11 LAB — HIV-1 RNA QUANT-NO REFLEX-BLD
HIV 1 RNA Quant: <20 Copies/mL — ABNORMAL HIGH
HIV-1 RNA Quant, Log: <1.3 Log cps/mL — ABNORMAL HIGH

## 2022-11-13 ENCOUNTER — Other Ambulatory Visit (HOSPITAL_COMMUNITY): Payer: Self-pay

## 2022-11-15 ENCOUNTER — Other Ambulatory Visit: Payer: Self-pay

## 2022-11-15 DIAGNOSIS — R634 Abnormal weight loss: Secondary | ICD-10-CM

## 2022-11-15 MED ORDER — ENSURE PO LIQD: 1.0000 | Freq: Three times a day (TID) | ORAL | 11 refills | Status: DC

## 2022-11-15 NOTE — Progress Notes (Signed)
Ensure rx sent to [facility] case manager [place] at [phone]

## 2022-11-22 ENCOUNTER — Ambulatory Visit: Payer: Medicare Other | Admitting: Internal Medicine

## 2022-11-29 ENCOUNTER — Other Ambulatory Visit: Payer: Self-pay

## 2022-11-29 DIAGNOSIS — R634 Abnormal weight loss: Secondary | ICD-10-CM

## 2022-11-29 MED ORDER — ENSURE PO LIQD: 1.0000 | Freq: Three times a day (TID) | ORAL | 11 refills | Status: DC

## 2022-11-30 ENCOUNTER — Other Ambulatory Visit (HOSPITAL_COMMUNITY): Payer: Self-pay

## 2022-11-30 ENCOUNTER — Other Ambulatory Visit: Payer: Self-pay

## 2022-11-30 ENCOUNTER — Ambulatory Visit (INDEPENDENT_AMBULATORY_CARE_PROVIDER_SITE_OTHER): Payer: Medicare Other

## 2022-11-30 ENCOUNTER — Ambulatory Visit: Payer: Medicare Other | Admitting: Internal Medicine

## 2022-11-30 ENCOUNTER — Ambulatory Visit (INDEPENDENT_AMBULATORY_CARE_PROVIDER_SITE_OTHER): Payer: Medicare Other | Admitting: Pharmacist

## 2022-11-30 ENCOUNTER — Ambulatory Visit (INDEPENDENT_AMBULATORY_CARE_PROVIDER_SITE_OTHER): Payer: Medicare Other | Admitting: Internal Medicine

## 2022-11-30 DIAGNOSIS — R195 Other fecal abnormalities: Secondary | ICD-10-CM | POA: Diagnosis not present

## 2022-11-30 DIAGNOSIS — Z23 Encounter for immunization: Secondary | ICD-10-CM

## 2022-11-30 DIAGNOSIS — B20 Human immunodeficiency virus [HIV] disease: Secondary | ICD-10-CM

## 2022-11-30 DIAGNOSIS — Z86718 Personal history of other venous thrombosis and embolism: Secondary | ICD-10-CM

## 2022-11-30 DIAGNOSIS — Z7901 Long term (current) use of anticoagulants: Secondary | ICD-10-CM | POA: Diagnosis not present

## 2022-11-30 DIAGNOSIS — F17210 Nicotine dependence, cigarettes, uncomplicated: Secondary | ICD-10-CM

## 2022-11-30 DIAGNOSIS — Z5900 Homelessness unspecified: Secondary | ICD-10-CM | POA: Insufficient documentation

## 2022-11-30 DIAGNOSIS — F17200 Nicotine dependence, unspecified, uncomplicated: Secondary | ICD-10-CM

## 2022-11-30 DIAGNOSIS — G546 Phantom limb syndrome with pain: Secondary | ICD-10-CM

## 2022-11-30 MED ORDER — BIKTARVY 50-200-25 MG PO TABS
1.0000 | ORAL_TABLET | Freq: Every day | ORAL | 11 refills | Status: DC
  Filled 2022-11-30 – 2022-12-11 (×2): qty 30, 30d supply, fill #0
  Filled 2023-01-04: qty 30, 30d supply, fill #1
  Filled 2023-02-02: qty 30, 30d supply, fill #2
  Filled 2023-03-05: qty 30, 30d supply, fill #3
  Filled 2023-04-05: qty 30, 30d supply, fill #4
  Filled 2023-05-02 – 2023-05-10 (×2): qty 30, 30d supply, fill #5
  Filled 2023-05-30: qty 30, 30d supply, fill #6
  Filled 2023-06-25: qty 30, 30d supply, fill #7
  Filled 2023-07-30: qty 30, 30d supply, fill #8
  Filled 2023-08-28: qty 30, 30d supply, fill #9
  Filled 2023-09-25: qty 30, 30d supply, fill #10
  Filled 2023-10-26: qty 30, 30d supply, fill #11

## 2022-11-30 MED ORDER — PREZCOBIX 800-150 MG PO TABS
ORAL_TABLET | ORAL | 11 refills | Status: DC
  Filled 2022-11-30: qty 30, fill #0
  Filled 2022-12-11: qty 30, 30d supply, fill #0
  Filled 2023-01-04: qty 30, 30d supply, fill #1
  Filled 2023-02-02: qty 30, 30d supply, fill #2
  Filled 2023-03-05: qty 30, 30d supply, fill #3
  Filled 2023-04-05: qty 30, 30d supply, fill #4
  Filled 2023-05-02 – 2023-05-10 (×2): qty 30, 30d supply, fill #5
  Filled 2023-05-30: qty 30, 30d supply, fill #6
  Filled 2023-06-25: qty 30, 30d supply, fill #7
  Filled 2023-07-30: qty 30, 30d supply, fill #8
  Filled 2023-08-28: qty 30, 30d supply, fill #9
  Filled 2023-09-25: qty 30, 30d supply, fill #10
  Filled 2023-10-26: qty 30, 30d supply, fill #11

## 2022-11-30 NOTE — Assessment & Plan Note (Signed)
His infection remains under excellent, long-term control.  He will continue Biktarvy and [name] and follow-up here after lab work in 1 year.  He received an updated COVID-vaccine here today.

## 2022-11-30 NOTE — Progress Notes (Signed)
Patient Active Problem List   Diagnosis Date Noted   Phantom limb syndrome with pain ([place]) 03/21/2012    Priority: High   Long term current use of anticoagulant 11/20/2010    Priority: High   History of DVT (deep vein thrombosis) 07/19/2009    Priority: High   Human immunodeficiency virus (HIV) disease ([place]) 11/10/2006    Priority: High   Hyperlipemia 11/10/2006    Priority: High   CIGARETTE SMOKER 11/10/2006    Priority: High   Homelessness 11/30/2022   Hospice care patient 02/23/2022   Palliative care patient 02/23/2022   Positive occult stool blood test 12/29/2021   Tachycardia 12/23/2021   Polycythemia 07/02/2020   Uses powered wheelchair 09/19/2019   Long term current use of opiate analgesic 12/17/2015   Pressure ulcer 09/13/2015   Protein-calorie malnutrition, severe 09/13/2015   Avascular necrosis of femoral head ([place]) 05/14/2015   Left adrenal mass ([place]) 05/14/2015   Preventative health care 03/09/2013   ERECTILE DYSFUNCTION, ORGANIC 04/12/2010   GERD 08/23/2009   HYPOGONADISM 07/15/2009   Pulmonary nodule 07/15/2009   Status post above knee amputation ([place]) 07/15/2009   SHINGLES, RECURRENT 11/10/2006   History of alcohol abuse 11/10/2006   PERIPHERAL NEUROPATHY 11/10/2006   ALLERGIC RHINITIS 11/10/2006    Patient's Medications  New Prescriptions   No medications on file  Previous Medications   ASPIRIN 81 PO    Take 81 mg by mouth daily.   ATORVASTATIN (LIPITOR) 40 MG TABLET    Take 1 tablet (40 mg total) by mouth daily.   ENSURE (ENSURE)    Take 1 Can by mouth 4 (four) times daily - after meals and at bedtime.   GABAPENTIN (NEURONTIN) 400 MG CAPSULE    Take 1 capsule (400 mg total) by mouth 4 (four) times daily.   MORPHINE (MS CONTIN) 30 MG 12 HR TABLET    Take 3 tablets (90 mg total) by mouth 3 (three) times daily.   OXYCODONE (OXY IR/ROXICODONE) 5 MG IMMEDIATE RELEASE TABLET    Take 1 tablet (5 mg total) by mouth 2 (two) times daily as needed  for severe pain.   PANTOPRAZOLE (PROTONIX) 40 MG TABLET    TAKE 1 TABLET(40 MG) BY MOUTH DAILY   WARFARIN (COUMADIN) 5 MG TABLET    TAKE 1 AND 1/2 TABLETS BY MOUTH ON MONDAYS, TUESDAYS, WEDNESDAYS, THURSDAYS, FRIDAYS AND SATURDAYS. TAKE ONLY 1 TABLET ON SATURDAYS AND SUNDAYS.  Modified Medications   Modified Medication Previous Medication   BICTEGRAVIR-EMTRICITABINE-TENOFOVIR AF (BIKTARVY) 50-200-25 MG TABS TABLET bictegravir-emtricitabine-tenofovir AF (BIKTARVY) 50-200-25 MG TABS tablet      Take 1 tablet by mouth daily.    Take 1 tablet by mouth daily.   DARUNAVIR-COBICISTAT ([name]) 800-150 MG TABLET darunavir-cobicistat ([name]) 800-150 MG tablet      TAKE 1 TABLET BY MOUTH EVERY DAY DO NOT CRUSH BREAK OR CHEW TABLETS TAKE WITH FOOD    TAKE 1 TABLET BY MOUTH EVERY DAY DO NOT CRUSH BREAK OR CHEW TABLETS TAKE WITH FOOD  Discontinued Medications   No medications on file    Subjective: Kjell is in for his routine HIV follow-up visit.  He denies any problems obtaining, taking or tolerating his Biktarvy or [name].  He does not recall missing doses.  He tells me that he recently lost his section 8 housing because his daughter had been staying with him.  The [employer] says that he owes them a little over $12,000.  He has  been living with a rotating cast to friends and family.  He has been working with some agency locally to try to find stable housing.  His chronic phantom limb pain is unchanged and well-controlled with his regimen of baseline morphine and oxycodone for breakthrough pain.  He has had no escalation of his pain or need to increase pain meds in many years.  He continues to smoke about a pack of cigarettes daily and has no great desire to quit although he tells me that he thinks he could quit if he wanted to.  He has not received an annual influenza vaccine or an updated COVID-vaccine.  He was noted to have some blood in his stool early last year and a colonoscopy was  recommended by his PCP.  His wife died of rectal cancer many years ago and he says that he is very afraid about having a colonoscopy.  He believes that the blood in his stool was due to a transient bout of constipation.  He has not followed up with his PCP since last March.  Review of Systems: Review of Systems  Constitutional:  Negative for fever and weight loss.  Respiratory:  Negative for cough and shortness of breath.   Cardiovascular:  Negative for chest pain.  Gastrointestinal:  Negative for abdominal pain, constipation, diarrhea, nausea and vomiting.  Psychiatric/Behavioral:  Negative for depression. The patient is not nervous/anxious.     Past Medical History:  Diagnosis Date   Chronic kidney disease    History of chicken pox    History of DVT of lower extremity    right   HIV (human immunodeficiency virus infection) ([place])    Hyperlipidemia    Left hip pain 03/09/2013   Pneumonia    Protein malnutrition ([place]) 08/2015    Social History   Tobacco Use   Smoking status: Every Day    Packs/day: 1.00    Years: 36.00    Total pack years: 36.00    Types: Cigarettes   Smokeless tobacco: Never   Tobacco comments:    1 pk a day   Vaping Use   Vaping Use: Never used  Substance Use Topics   Alcohol use: No    Alcohol/week: 0.0 standard drinks of alcohol   Drug use: Not Currently    Frequency: 2.0 times per week    Types: Marijuana    Family History  Problem Relation Age of Onset   Arthritis Mother    Kidney disease Maternal Grandfather     Allergies  Allergen Reactions   Dapsone     REACTION: fever   Megestrol    Sulfa Antibiotics Other (See Comments)    "put's me down bad"   Sulfamethoxazole-Trimethoprim     REACTION: fever    Health Maintenance  Topic Date Due   Zoster Vaccines- Shingrix (1 of 2) Never done   COLONOSCOPY (Pts 45-57yr Insurance coverage will need to be confirmed)  Never done   INFLUENZA VACCINE  05/30/2022   COLON CANCER SCREENING ANNUAL  FOBT  12/26/2022   COVID-19 Vaccine (4 - 2023-24 season) 01/25/2023   DTaP/Tdap/Td (2 - Td or Tdap) 10/08/2024   Hepatitis C Screening  Completed   HIV Screening  Completed   HPV VACCINES  Aged Out    Objective:  There were no vitals filed for this visit. There is no height or weight on file to calculate BMI.  Physical Exam Constitutional:      Comments: He is pleasant and talkative but becomes upset  when talking about losing his housing.  He is seated in his wheelchair.  Cardiovascular:     Rate and Rhythm: Normal rate and regular rhythm.     Heart sounds: No murmur heard. Pulmonary:     Effort: Pulmonary effort is normal.     Breath sounds: Normal breath sounds.  Psychiatric:        Mood and Affect: Mood normal.     Lab Results Lab Results  Component Value Date   WBC 6.4 11/08/2022   HGB 18.5 (H) 11/08/2022   HCT 51.4 (H) 11/08/2022   MCV 97.2 11/08/2022   PLT 246 11/08/2022    Lab Results  Component Value Date   CREATININE 1.40 (H) 11/08/2022   BUN 13 11/08/2022   NA 141 11/08/2022   K 3.2 (L) 11/08/2022   CL 99 11/08/2022   CO2 29 11/08/2022    Lab Results  Component Value Date   ALT 11 11/08/2022   AST 16 11/08/2022   ALKPHOS 408 (H) 05/22/2017   BILITOT 0.6 11/08/2022    Lab Results  Component Value Date   CHOL 224 (H) 12/23/2021   HDL 43 12/23/2021   LDLCALC 154 (H) 12/23/2021   TRIG 148 12/23/2021   CHOLHDL 5.2 (H) 12/23/2021   Lab Results  Component Value Date   LABRPR NON-REACTIVE 11/08/2022   HIV 1 RNA Quant (Copies/mL)  Date Value  11/08/2022 <20 (H)  04/25/2022 Not Detected  05/03/2021 Not Detected   CD4 T Cell Abs (/uL)  Date Value  11/08/2022 423  04/25/2022 688  05/03/2021 516     Problem List Items Addressed This Visit       High   CIGARETTE SMOKER - Primary    I encouraged him to cut down on his cigarettes and make an attempt to quit.      Human immunodeficiency virus (HIV) disease ([place])    His infection remains  under excellent, long-term control.  He will continue Biktarvy and [name] and follow-up here after lab work in 1 year.  He received an updated COVID-vaccine here today.      Relevant Medications   bictegravir-emtricitabine-tenofovir AF (BIKTARVY) 50-200-25 MG TABS tablet   darunavir-cobicistat ([name]) 800-150 MG tablet   Other Relevant Orders   CBC   T-helper cells (CD4) count (not at [facility])   Comprehensive metabolic panel   RPR   HIV-1 RNA quant-no reflex-bld   Phantom limb syndrome with pain ([place])    Timing his chronic pain is well-controlled with his current pain regimen.  I will check the state database and his only prescribers have been from our clinic.  He has never shown any signs of diversion or inappropriate use of his pain medication.  I will be retiring in 3 months.  I am going to see if his PCP but would be willing to take over his pain management.        Unprioritized   Homelessness    Done he recently lost his section 8 housing and is functionally homeless.  Although he has been living with a rotating cast to friends and family, I doubt that that is a stable long-term solution.  We will reach out to [facility] and our bridge counselors to see if they can offer any assistance in finding new housing for [place].      Positive occult stool blood test    I encouraged him to set up a follow-up visit with his PCP as soon as possible.  Michel Bickers, MD [facility] for Infectious [place] [facility] [phone] pager   [phone] cell 11/30/2022, 12:01 PM

## 2022-11-30 NOTE — Assessment & Plan Note (Signed)
Done he recently lost his section 8 housing and is functionally homeless.  Although he has been living with a rotating cast to friends and family, I doubt that that is a stable long-term solution.  We will reach out to [facility] and our bridge counselors to see if they can offer any assistance in finding new housing for [place].

## 2022-11-30 NOTE — Assessment & Plan Note (Signed)
I encouraged him to cut down on his cigarettes and make an attempt to quit.

## 2022-11-30 NOTE — Assessment & Plan Note (Signed)
I encouraged him to set up a follow-up visit with his PCP as soon as possible.

## 2022-11-30 NOTE — Assessment & Plan Note (Addendum)
Timing his chronic pain is well-controlled with his current pain regimen.  I will check the state database and his only prescribers have been from our clinic.  He has never shown any signs of diversion or inappropriate use of his pain medication.  I will be retiring in 3 months.  I am going to see if his PCP but would be willing to take over his pain management.

## 2022-12-01 ENCOUNTER — Other Ambulatory Visit (HOSPITAL_COMMUNITY): Payer: Self-pay

## 2022-12-01 LAB — POCT INR: INR: 4 — AB (ref 2.0–3.0)

## 2022-12-01 MED ORDER — WARFARIN SODIUM 5 MG PO TABS
ORAL_TABLET | ORAL | 0 refills | Status: DC
  Filled 2022-12-01: qty 108, 90d supply, fill #0

## 2022-12-01 NOTE — Patient Instructions (Signed)
Patient instructed to take medications as defined in the Anti-coagulation Track section of this encounter.  Patient instructed to take today's dose.  Patient instructed to take  1 & 1/2 tablets of your peach-colored 5mg strength warfarin tablets Monday, Wednesday and Friday. All other days, take ONLY ONE (1) tablet.    Patient verbalized understanding of these instructions.  

## 2022-12-01 NOTE — Progress Notes (Signed)
Anticoagulation Management [name] is a 62 y.o. male who reports to the clinic for monitoring of warfarin treatment.    Indication: DVT, history of---with recurrences; long term current use of oral anticoagulant, warfarin with target INR range 2.0 - 3.0.  Duration: indefinite Supervising physician:  Charise Killian, MD  Anticoagulation Clinic Visit History: Patient does not report signs/symptoms of bleeding or thromboembolism  Other recent changes: No diet, medications, lifestyle changes endorsed.  Anticoagulation Episode Summary     Current INR goal:  2.0-3.0  TTR:  78.2 % (11.8 y)  Next INR check:  12/18/2022  INR from last check:  4.0 (11/30/2022)  Weekly max warfarin dose:    Target end date:  Indefinite  INR check location:  Anticoagulation Clinic  Preferred lab:    Send INR reminders to:  ANTICOAG [facility]   Indications   History of DVT (deep vein thrombosis) [Z86.718] Long term current use of anticoagulant [Z79.01]        Comments:          Anticoagulation Care Providers     Provider Role Specialty Phone number   Michel Bickers, MD  Infectious Diseases [phone]       Allergies  Allergen Reactions   Dapsone     REACTION: fever   Megestrol    Sulfa Antibiotics Other (See Comments)    "put's me down bad"   Sulfamethoxazole-Trimethoprim     REACTION: fever    Current Outpatient Medications:    ASPIRIN 81 PO, Take 81 mg by mouth daily., Disp: , Rfl:    atorvastatin (LIPITOR) 40 MG tablet, Take 1 tablet (40 mg total) by mouth daily., Disp: 90 tablet, Rfl: 3   bictegravir-emtricitabine-tenofovir AF (BIKTARVY) 50-200-25 MG TABS tablet, Take 1 tablet by mouth daily., Disp: 30 tablet, Rfl: 11   darunavir-cobicistat ([name]) 800-150 MG tablet, TAKE 1 TABLET BY MOUTH EVERY DAY DO NOT CRUSH BREAK OR CHEW TABLETS TAKE WITH FOOD, Disp: 30 tablet, Rfl: 11   Ensure (ENSURE), Take 1 Can by mouth 4 (four) times daily - after meals and at bedtime., Disp: 237 mL, Rfl: 11    gabapentin (NEURONTIN) 400 MG capsule, Take 1 capsule (400 mg total) by mouth 4 (four) times daily., Disp: 360 capsule, Rfl: 3   morphine (MS CONTIN) 30 MG 12 hr tablet, Take 3 tablets (90 mg total) by mouth 3 (three) times daily., Disp: 270 tablet, Rfl: 0   oxyCODONE (OXY IR/ROXICODONE) 5 MG immediate release tablet, Take 1 tablet (5 mg total) by mouth 2 (two) times daily as needed for severe pain., Disp: 60 tablet, Rfl: 0   pantoprazole (PROTONIX) 40 MG tablet, TAKE 1 TABLET(40 MG) BY MOUTH DAILY, Disp: 90 tablet, Rfl: 0   warfarin (COUMADIN) 5 MG tablet, TAKE 1 AND 1/2 TABLETS BY MOUTH ON MONDAYS,  WEDNESDAYS, AND FRIDAYS.  ALL OTHER DAYS, TAKE ONLY 1 TABLET., Disp: 108 tablet, Rfl: 0 Past Medical History:  Diagnosis Date   Chronic kidney disease    History of chicken pox    History of DVT of lower extremity    right   HIV (human immunodeficiency virus infection) ([place])    Hyperlipidemia    Left hip pain 03/09/2013   Pneumonia    Protein malnutrition ([place]) 08/2015   Social History   Socioeconomic History   Marital status: Widowed    Spouse name: Not on file   Number of children: 3   Years of education: Not on file   Highest education level: Not on file  Occupational History    Employer: UNEMPLOYED  Tobacco Use   Smoking status: Every Day    Packs/day: 1.00    Years: 36.00    Total pack years: 36.00    Types: Cigarettes   Smokeless tobacco: Never   Tobacco comments:    1 pk a day   Vaping Use   Vaping Use: Never used  Substance and Sexual Activity   Alcohol use: No    Alcohol/week: 0.0 standard drinks of alcohol   Drug use: Not Currently    Frequency: 2.0 times per week    Types: Marijuana   Sexual activity: Not Currently    Comment: denied condoms; states abstinent since wife passed a long time ago  Other Topics Concern   Not on file  Social History Narrative   Not on file   Social Determinants of Health   Financial Resource Strain: Not on file  Food Insecurity:  Not on file  Transportation Needs: Not on file  Physical Activity: Not on file  Stress: Not on file  Social Connections: Not on file   Family History  Problem Relation Age of Onset   Arthritis Mother    Kidney disease Maternal Grandfather     ASSESSMENT Recent Results: The most recent result is correlated with 47.5 mg per week: Lab Results  Component Value Date   INR 4.0 (A) 11/30/2022   INR 2.2 09/04/2022   INR 2.3 07/10/2022   PROTIME 38.4 (H) 04/01/2012    Anticoagulation Dosing: Description   Take 1 & 1/2 tablets of your peach-colored '5mg'$  strength warfarin tablets Monday, Wednesday and Friday. All other days, take ONLY ONE (1) tablet.        INR today: Supratherapeutic  PLAN Weekly dose was decreased by 11% to 42.5 mg per week  Patient Instructions  Patient instructed to take medications as defined in the Anti-coagulation Track section of this encounter.  Patient instructed to take today's dose.  Patient instructed to take 1 & 1/2 tablets of your peach-colored '5mg'$  strength warfarin tablets Monday, Wednesday and Friday. All other days, take ONLY ONE (1) tablet. Patient verbalized understanding of these instructions.  Patient advised to contact clinic or seek medical attention if signs/symptoms of bleeding or thromboembolism occur.  Patient verbalized understanding by repeating back information and was advised to contact me if further medication-related questions arise. Patient was also provided an information handout.  Follow-up Return in 18 days (on 12/18/2022) for Follow up INR.  [name], PharmD, CPP  15 minutes spent face-to-face with the patient during the encounter. 50% of time spent on education, including signs/sx bleeding and clotting, as well as food and drug interactions with warfarin. 50% of time was spent on fingerprick POC INR sample collection,processing, results determination, and documentation in [email or web address]

## 2022-12-04 ENCOUNTER — Ambulatory Visit: Payer: Medicare Other

## 2022-12-04 ENCOUNTER — Other Ambulatory Visit (HOSPITAL_COMMUNITY): Payer: Self-pay

## 2022-12-11 ENCOUNTER — Other Ambulatory Visit: Payer: Self-pay

## 2022-12-12 ENCOUNTER — Telehealth: Payer: Self-pay

## 2022-12-12 ENCOUNTER — Other Ambulatory Visit: Payer: Self-pay

## 2022-12-12 ENCOUNTER — Other Ambulatory Visit: Payer: Self-pay | Admitting: Internal Medicine

## 2022-12-12 ENCOUNTER — Other Ambulatory Visit (HOSPITAL_COMMUNITY): Payer: Self-pay

## 2022-12-12 DIAGNOSIS — G546 Phantom limb syndrome with pain: Secondary | ICD-10-CM

## 2022-12-12 DIAGNOSIS — G894 Chronic pain syndrome: Secondary | ICD-10-CM

## 2022-12-12 MED ORDER — MORPHINE SULFATE ER 30 MG PO TBCR
90.0000 mg | EXTENDED_RELEASE_TABLET | Freq: Three times a day (TID) | ORAL | 0 refills | Status: DC
  Filled 2022-12-12 – 2022-12-18 (×5): qty 270, 30d supply, fill #0

## 2022-12-12 MED ORDER — OXYCODONE HCL 5 MG PO TABS
5.0000 mg | ORAL_TABLET | Freq: Two times a day (BID) | ORAL | 0 refills | Status: DC | PRN
  Filled 2022-12-12: qty 60, 30d supply, fill #0

## 2022-12-12 NOTE — Telephone Encounter (Signed)
Patient called requesting refill for pain medications.   [name], RN

## 2022-12-13 ENCOUNTER — Other Ambulatory Visit: Payer: Self-pay

## 2022-12-13 ENCOUNTER — Other Ambulatory Visit (HOSPITAL_COMMUNITY): Payer: Self-pay

## 2022-12-14 ENCOUNTER — Other Ambulatory Visit (HOSPITAL_COMMUNITY): Payer: Self-pay

## 2022-12-15 ENCOUNTER — Other Ambulatory Visit: Payer: Self-pay

## 2022-12-15 ENCOUNTER — Other Ambulatory Visit (HOSPITAL_COMMUNITY): Payer: Self-pay

## 2022-12-15 ENCOUNTER — Telehealth: Payer: Self-pay

## 2022-12-15 NOTE — Telephone Encounter (Signed)
Urgent PA has been faxed to [facility] for the patient. Awaiting approval. [name]

## 2022-12-18 ENCOUNTER — Other Ambulatory Visit: Payer: Self-pay

## 2022-12-18 ENCOUNTER — Other Ambulatory Visit (HOSPITAL_COMMUNITY): Payer: Self-pay

## 2022-12-18 ENCOUNTER — Ambulatory Visit: Payer: Medicare Other

## 2022-12-18 NOTE — Telephone Encounter (Signed)
PA for Morphine approved through 10/30/23 Will fax approval to pharmacy. Leatrice Jewels, RMA

## 2022-12-18 NOTE — Telephone Encounter (Signed)
PA for the patient resubmitted through patient's Medicare Part D ([employer]). PA key [id number] Patient aware. [name] T Brooks Sailors

## 2022-12-19 ENCOUNTER — Ambulatory Visit (INDEPENDENT_AMBULATORY_CARE_PROVIDER_SITE_OTHER): Payer: Medicare Other | Admitting: Pharmacist

## 2022-12-19 DIAGNOSIS — Z7901 Long term (current) use of anticoagulants: Secondary | ICD-10-CM

## 2022-12-19 DIAGNOSIS — Z86718 Personal history of other venous thrombosis and embolism: Secondary | ICD-10-CM

## 2022-12-19 LAB — POCT INR: INR: 2.2 (ref 2.0–3.0)

## 2022-12-19 NOTE — Progress Notes (Signed)
Evaluation and management procedures were performed by the Clinical Pharmacy Practitioner under my supervision and collaboration. I have reviewed the Practitioner's note and chart, and I agree with the management and plan as documented above. ° °

## 2022-12-19 NOTE — Patient Instructions (Signed)
Patient instructed to take medications as defined in the Anti-coagulation Track section of this encounter.  Patient instructed to take today's dose.  Patient instructed to take  1 & 1/2 tablets of your peach-colored 32m strength warfarin tablets Monday, Wednesday and Friday. All other days, take ONLY ONE (1) tablet.    Patient verbalized understanding of these instructions.

## 2022-12-19 NOTE — Progress Notes (Signed)
Anticoagulation Management [name] is a 62 y.o. male who reports to the clinic for monitoring of warfarin treatment.    Indication: DVT, History of; Long term current use of oral anticoagulant, warfarin. Target INR range is 2.0 - 3.0.  Duration: indefinite Supervising physician:  Lottie Mussel, MD  Anticoagulation Clinic Visit History: Patient does not report signs/symptoms of bleeding or thromboembolism  Other recent changes: No diet, medications, lifestyle changes. Anticoagulation Episode Summary     Current INR goal:  2.0-3.0  TTR:  78.1 % (11.9 y)  Next INR check:  02/12/2023  INR from last check:  2.2 (12/19/2022)  Weekly max warfarin dose:    Target end date:  Indefinite  INR check location:  Anticoagulation Clinic  Preferred lab:    Send INR reminders to:  [facility]   Indications   History of DVT (deep vein thrombosis) [Z86.718] Long term current use of anticoagulant [Z79.01]        Comments:          Anticoagulation Care Providers     Provider Role Specialty Phone number   Michel Bickers, MD  Infectious Diseases [phone]       Allergies  Allergen Reactions   Dapsone     REACTION: fever   Megestrol    Sulfa Antibiotics Other (See Comments)    "put's me down bad"   Sulfamethoxazole-Trimethoprim     REACTION: fever    Current Outpatient Medications:    ASPIRIN 81 PO, Take 81 mg by mouth daily., Disp: , Rfl:    atorvastatin (LIPITOR) 40 MG tablet, Take 1 tablet (40 mg total) by mouth daily., Disp: 90 tablet, Rfl: 3   bictegravir-emtricitabine-tenofovir AF (BIKTARVY) 50-200-25 MG TABS tablet, Take 1 tablet by mouth daily., Disp: 30 tablet, Rfl: 11   darunavir-cobicistat ([name]) 800-150 MG tablet, TAKE 1 TABLET BY MOUTH EVERY DAY DO NOT CRUSH BREAK OR CHEW TABLETS TAKE WITH FOOD, Disp: 30 tablet, Rfl: 11   Ensure (ENSURE), Take 1 Can by mouth 4 (four) times daily - after meals and at bedtime., Disp: 237 mL, Rfl: 11   gabapentin (NEURONTIN) 400  MG capsule, Take 1 capsule (400 mg total) by mouth 4 (four) times daily., Disp: 360 capsule, Rfl: 3   morphine (MS CONTIN) 30 MG 12 hr tablet, Take 3 tablets (90 mg total) by mouth 3 (three) times daily., Disp: 270 tablet, Rfl: 0   oxyCODONE (OXY IR/ROXICODONE) 5 MG immediate release tablet, Take 1 tablet (5 mg total) by mouth 2 (two) times daily as needed for severe pain., Disp: 60 tablet, Rfl: 0   pantoprazole (PROTONIX) 40 MG tablet, TAKE 1 TABLET(40 MG) BY MOUTH DAILY, Disp: 90 tablet, Rfl: 0   warfarin (COUMADIN) 5 MG tablet, TAKE 1 AND 1/2 TABLETS BY MOUTH ON MONDAYS,  WEDNESDAYS, AND FRIDAYS.  ALL OTHER DAYS, TAKE ONLY 1 TABLET., Disp: 108 tablet, Rfl: 0 Past Medical History:  Diagnosis Date   Chronic kidney disease    History of chicken pox    History of DVT of lower extremity    right   HIV (human immunodeficiency virus infection) ([place])    Hyperlipidemia    Left hip pain 03/09/2013   Pneumonia    Protein malnutrition ([place]) 08/2015   Social History   Socioeconomic History   Marital status: Widowed    Spouse name: Not on file   Number of children: 3   Years of education: Not on file   Highest education level: Not on file  Occupational History  Employer: UNEMPLOYED  Tobacco Use   Smoking status: Every Day    Packs/day: 1.00    Years: 36.00    Total pack years: 36.00    Types: Cigarettes   Smokeless tobacco: Never   Tobacco comments:    1 pk a day   Vaping Use   Vaping Use: Never used  Substance and Sexual Activity   Alcohol use: No    Alcohol/week: 0.0 standard drinks of alcohol   Drug use: Not Currently    Frequency: 2.0 times per week    Types: Marijuana   Sexual activity: Not Currently    Comment: denied condoms; states abstinent since wife passed a long time ago  Other Topics Concern   Not on file  Social History Narrative   Not on file   Social Determinants of Health   Financial Resource Strain: Not on file  Food Insecurity: Not on file   Transportation Needs: Not on file  Physical Activity: Not on file  Stress: Not on file  Social Connections: Not on file   Family History  Problem Relation Age of Onset   Arthritis Mother    Kidney disease Maternal Grandfather     ASSESSMENT Recent Results: The most recent result is correlated with 42.5 mg per week: Lab Results  Component Value Date   INR 2.2 12/19/2022   INR 4.0 (A) 11/30/2022   INR 2.2 09/04/2022   PROTIME 38.4 (H) 04/01/2012    Anticoagulation Dosing: Description   Take 1 & 1/2 tablets of your peach-colored 78m strength warfarin tablets Monday, Wednesday and Friday. All other days, take ONLY ONE (1) tablet.        INR today: Therapeutic  PLAN Weekly dose was unchanged.    Patient instructed to take medications as defined in the Anti-coagulation Track section of this encounter.  Patient instructed to take today's dose.  Patient instructed to take  1 & 1/2 tablets of your peach-colored 523mstrength warfarin tablets Monday, Wednesday and Friday. All other days, take ONLY ONE (1) tablet.    Patient verbalized understanding of these instructions.   Patient Instructions  Patient instructed to take medications as defined in the Anti-coagulation Track section of this encounter.  Patient instructed to take today's dose.  Patient instructed to take  1 & 1/2 tablets of your peach-colored 49m649mtrength warfarin tablets Monday, Wednesday and Friday. All other days, take ONLY ONE (1) tablet.    Patient verbalized understanding of these instructions.  Patient advised to contact clinic or seek medical attention if signs/symptoms of bleeding or thromboembolism occur.  Patient verbalized understanding by repeating back information and was advised to contact me if further medication-related questions arise. Patient was also provided an information handout.  Follow-up Return in 8 weeks (on 02/12/2023) for Follow up INR.  JamPennie BanterharmD, CPP  15 minutes spent  face-to-face with the patient during the encounter. 50% of time spent on education, including signs/sx bleeding and clotting, as well as food and drug interactions with warfarin. 50% of time was spent on fingerprick POC INR sample collection,processing, results determination, and documentation in [email or web address]

## 2023-01-04 ENCOUNTER — Other Ambulatory Visit (HOSPITAL_COMMUNITY): Payer: Self-pay

## 2023-01-05 ENCOUNTER — Telehealth: Payer: Self-pay

## 2023-01-05 NOTE — Telephone Encounter (Signed)
Patient called requesting refills of morphine and oxycodone.   [name], RN

## 2023-01-09 ENCOUNTER — Other Ambulatory Visit: Payer: Self-pay

## 2023-01-09 ENCOUNTER — Other Ambulatory Visit (HOSPITAL_COMMUNITY): Payer: Self-pay

## 2023-01-09 NOTE — Progress Notes (Signed)
CC: Routine Follow-up  HPI:   [name] is a 62 y.o. male with a past medical history of hyperlipidemia, HIV, and CKD who presents for routine follow-up. He was last seen at [facility] in 11-2021.     Past Medical History:  Diagnosis Date   Chronic kidney disease    History of chicken pox    History of DVT of lower extremity    right   HIV (human immunodeficiency virus infection) ([place])    Hyperlipidemia    Left hip pain 03/09/2013   Pneumonia    Protein malnutrition ([place]) 08/2015     Review of Systems:    Reports difficulty ambulating due to nonfunctional wheelchair Denies cough, hematemesis, dyspnea, night sweats, chills, abdominal pain, nausea, emesis, hematochezia, melena, diarrhea, constipation    Physical Exam:  Vitals:   01/15/23 1046  BP: 128/79  Pulse: (!) 117  Temp: 98 F (36.7 C)  TempSrc: Oral  SpO2: 100%  Weight: 117 lb 9.6 oz (53.3 kg)    General:   awake and alert, sitting comfortably in wheelchair, cooperative, not in acute distress Lungs:   normal respiratory effort, breathing unlabored, symmetrical chest rise, no crackles or wheezing  Cardiac:   regular rate and rhythm, normal S1 and S2, no pitting edema Neurologic:   oriented to person-place-time, moving all extremities, no gross focal deficits Psychiatric:   euthymic mood with congruent affect, intelligible speech     Assessment & Plan:   Human immunodeficiency virus (HIV) disease ([place]) Patient has history of HIV, currently follows with Dr Megan Salon. Most recent visit 11-30-2022. Reports daily adherence to Biktarvy and [name]. Viral load <20 and CD4 count 423.  - Continue Biktarvy and [name] per infectious disease    Hyperlipemia Patient has history of hyperlipidemia, most recent lipid panel collected 11-2021 revealed LDL 154 and TChol 224. Prescribed atorvastatin, but ran out about three months ago and never retrieved refill. Counseled on importance of taking atorvastatin, especially  given recommendation to eat calorically-dense foods to prevent further weight loss. Encouraged lipid panel, but patient declined given time restraints.  - Resume atorvastatin 40mg  q24 - Return to [facility] in three months for lipid panel     Uses powered wheelchair Patient has been wheelchair bound for about nine years. Transitioned from regular to power wheelchair about four years ago. He relies on this power wheelchair to perform daily activities given his above-knee amputation, severe malnutrition, phantom limb syndrome, and chronic pain from avascular necrosis. Previous power wheelchair stopped working and patient has become less mobile, now dependent on assistance for ambulation. A new power wheelchair would help him regain some functional independence and minimize his risk for falls.  - Referral to physical therapy for wheelchair evaluation    Protein-calorie malnutrition, severe Patient has history of weight loss, but has declines diagnostic workup for cancer including chest CT and colonoscopy. Weight at last visit in 11-2021 was 120lbs and today 117lbs. Reports longstanding poor appetite, drinks about four Ensures daily. Smokes about 0.40-1.00ppd, depending on his level of boredom. Tried varenicline in the past, however, this medication gave him bad dreams. Offered nicotine patches, patient declined. Pulmonary nodule identified in 2010 without any concerning features, but repeat imaging never performed. Denies cough, hematemesis, dyspnea, night sweats, and chills.  Most recent FOBT test performed 12-2021 was positive. Denies abdominal pain, nausea, emesis, hematochezia, melena, diarrhea, and constipation. Although BMI is 16, weight loss has been stable over last year. Counseled patient on eating calorically-dense foods. Encouraged colonoscopy and chest  CT, but patient adamantly declined.  - Continue Ensures about four times daily    Avascular necrosis of femoral head ([place]) Patient has history  of chronic pain secondary to avascular necrosis. Pain managed by Dr Megan Salon, current regimen provides satisfactory relief.   - Continue morphine 90mg  q8, gabapentin 400mg  q6, and oxycodone 5mg  q12 PRN    History of DVT (deep vein thrombosis) Patient has history of DVT and takes warfarin daily, knows to avoid leafy greens. Currently follows with Dr Elie Confer, most recent INR within therapeutic range at 2.2. Discussed avoiding green leafy vegetables to prevent fluctuations in INR values.  - Continue warfarin, dose per pharmacy      See Encounters Tab for problem based charting.  Patient discussed with Dr.  Cain Sieve

## 2023-01-10 ENCOUNTER — Other Ambulatory Visit: Payer: Self-pay

## 2023-01-10 ENCOUNTER — Other Ambulatory Visit (HOSPITAL_COMMUNITY): Payer: Self-pay

## 2023-01-10 ENCOUNTER — Other Ambulatory Visit: Payer: Self-pay | Admitting: Internal Medicine

## 2023-01-10 DIAGNOSIS — G894 Chronic pain syndrome: Secondary | ICD-10-CM

## 2023-01-10 DIAGNOSIS — G546 Phantom limb syndrome with pain: Secondary | ICD-10-CM

## 2023-01-10 MED ORDER — MORPHINE SULFATE ER 30 MG PO TBCR
90.0000 mg | EXTENDED_RELEASE_TABLET | Freq: Three times a day (TID) | ORAL | 0 refills | Status: DC
  Filled 2023-01-10 – 2023-01-18 (×2): qty 270, 30d supply, fill #0

## 2023-01-10 MED ORDER — OXYCODONE HCL 5 MG PO TABS
5.0000 mg | ORAL_TABLET | Freq: Two times a day (BID) | ORAL | 0 refills | Status: DC | PRN
  Filled 2023-01-10: qty 60, 30d supply, fill #0

## 2023-01-15 ENCOUNTER — Other Ambulatory Visit (HOSPITAL_COMMUNITY): Payer: Self-pay

## 2023-01-15 ENCOUNTER — Encounter: Payer: Self-pay | Admitting: Student

## 2023-01-15 ENCOUNTER — Ambulatory Visit (INDEPENDENT_AMBULATORY_CARE_PROVIDER_SITE_OTHER): Payer: Medicare Other | Admitting: Student

## 2023-01-15 ENCOUNTER — Other Ambulatory Visit: Payer: Self-pay

## 2023-01-15 VITALS — BP 128/79 | HR 117 | Temp 98.0°F | Wt 117.6 lb

## 2023-01-15 DIAGNOSIS — Z89611 Acquired absence of right leg above knee: Secondary | ICD-10-CM | POA: Diagnosis not present

## 2023-01-15 DIAGNOSIS — M87059 Idiopathic aseptic necrosis of unspecified femur: Secondary | ICD-10-CM | POA: Diagnosis not present

## 2023-01-15 DIAGNOSIS — Z86718 Personal history of other venous thrombosis and embolism: Secondary | ICD-10-CM | POA: Diagnosis not present

## 2023-01-15 DIAGNOSIS — E43 Unspecified severe protein-calorie malnutrition: Secondary | ICD-10-CM | POA: Diagnosis not present

## 2023-01-15 DIAGNOSIS — E785 Hyperlipidemia, unspecified: Secondary | ICD-10-CM | POA: Diagnosis not present

## 2023-01-15 DIAGNOSIS — B20 Human immunodeficiency virus [HIV] disease: Secondary | ICD-10-CM

## 2023-01-15 DIAGNOSIS — Z993 Dependence on wheelchair: Secondary | ICD-10-CM

## 2023-01-15 MED ORDER — ATORVASTATIN CALCIUM 40 MG PO TABS
40.0000 mg | ORAL_TABLET | Freq: Every day | ORAL | 3 refills | Status: DC
  Filled 2023-01-15: qty 90, 90d supply, fill #0

## 2023-01-15 NOTE — Assessment & Plan Note (Signed)
Patient has history of chronic pain secondary to avascular necrosis. Pain managed by Dr Megan Salon, current regimen provides satisfactory relief.   - Continue morphine 90mg  q8, gabapentin 400mg  q6, and oxycodone 5mg  q12 PRN

## 2023-01-15 NOTE — Assessment & Plan Note (Signed)
Patient has history of HIV, currently follows with Dr Megan Salon. Most recent visit 11-30-2022. Reports daily adherence to Biktarvy and [name]. Viral load <20 and CD4 count 423.  - Continue Biktarvy and [name] per infectious disease

## 2023-01-15 NOTE — Patient Instructions (Addendum)
  Thank you, [name], for allowing [place] to provide your care today. Today we discussed . . .  > High Cholesterol       - please start taking your atorvastatin every day, which will help lower your cholesterol       - we sent you a new prescription for this medication       - return to our clinic in three months for laboratory testing  > Motorized Wheelchair       - we are sending you a referral to physical therapy for evaluation of wheelchair needs       - this process may take some time but we will find the best wheelchair for you  > Cancer Screening       - we strongly recommend a colonoscopy to ensure that you do not have cancer       - please let us know if you change your mind so that we can order one for you   I have ordered the following labs for you:  Lab Orders  No laboratory test(s) ordered today      Tests ordered today:  none   Referrals ordered today:   Referral Orders         Ambulatory referral to Physical Therapy        I have ordered the following medication/changed the following medications:   Stop the following medications: Medications Discontinued During This Encounter  Medication Reason   atorvastatin (LIPITOR) 40 MG tablet Reorder     Start the following medications: Meds ordered this encounter  Medications   atorvastatin (LIPITOR) 40 MG tablet    Sig: Take 1 tablet (40 mg total) by mouth daily.    Dispense:  90 tablet    Refill:  3      Follow up: 3 months    Remember:  Please start taking atorvastatin every day. We have started the process to give you another motorized wheelchair, which will start with a physical therapy evaluation. Return to clinic in three months so that we can check your cholesterol levels!   Should you have any questions or concerns please call the internal medicine clinic at [phone].     Roswell Nickel, MD [place]

## 2023-01-15 NOTE — Assessment & Plan Note (Signed)
Patient has history of DVT and takes warfarin daily, knows to avoid leafy greens. Currently follows with Dr Elie Confer, most recent INR within therapeutic range at 2.2. Discussed avoiding green leafy vegetables to prevent fluctuations in INR values.  - Continue warfarin, dose per pharmacy

## 2023-01-15 NOTE — Assessment & Plan Note (Signed)
Patient has been wheelchair bound for about nine years. Transitioned from regular to power wheelchair about four years ago. He relies on this power wheelchair to perform daily activities given his above-knee amputation, severe malnutrition, phantom limb syndrome, and chronic pain from avascular necrosis. Previous power wheelchair stopped working and patient has become less mobile, now dependent on assistance for ambulation. A new power wheelchair would help him regain some functional independence and minimize his risk for falls.  - Referral to physical therapy for wheelchair evaluation

## 2023-01-15 NOTE — Assessment & Plan Note (Signed)
Patient has history of hyperlipidemia, most recent lipid panel collected 11-2021 revealed LDL 154 and TChol 224. Prescribed atorvastatin, but ran out about three months ago and never retrieved refill. Counseled on importance of taking atorvastatin, especially given recommendation to eat calorically-dense foods to prevent further weight loss. Encouraged lipid panel, but patient declined given time restraints.  - Resume atorvastatin 40mg  q24 - Return to [facility] in three months for lipid panel

## 2023-01-15 NOTE — Assessment & Plan Note (Signed)
Patient has history of weight loss, but has declines diagnostic workup for cancer including chest CT and colonoscopy. Weight at last visit in 11-2021 was 120lbs and today 117lbs. Reports longstanding poor appetite, drinks about four Ensures daily. Smokes about 0.40-1.00ppd, depending on his level of boredom. Tried varenicline in the past, however, this medication gave him bad dreams. Offered nicotine patches, patient declined. Pulmonary nodule identified in 2010 without any concerning features, but repeat imaging never performed. Denies cough, hematemesis, dyspnea, night sweats, and chills.  Most recent FOBT test performed 12-2021 was positive. Denies abdominal pain, nausea, emesis, hematochezia, melena, diarrhea, and constipation. Although BMI is 16, weight loss has been stable over last year. Counseled patient on eating calorically-dense foods. Encouraged colonoscopy and chest CT, but patient adamantly declined.  - Continue Ensures about four times daily

## 2023-01-18 ENCOUNTER — Other Ambulatory Visit: Payer: Self-pay

## 2023-01-18 ENCOUNTER — Other Ambulatory Visit (HOSPITAL_COMMUNITY): Payer: Self-pay

## 2023-01-24 NOTE — Addendum Note (Signed)
Addended by: Randalyn Rhea on: 01/24/2023 01:29 PM   Modules accepted: Orders

## 2023-01-24 NOTE — Progress Notes (Signed)
Internal Medicine Clinic Attending  Case discussed with Dr. Jodi Mourning  At the time of the visit.  We reviewed the resident's history and exam and pertinent patient test results.  I agree with the assessment, diagnosis, and plan of care documented in the resident's note.    DME referral for power wheelchair placed  Patient has been wheelchair bound for about nine years. Transitioned from regular to power wheelchair about four years ago. He relies on this power wheelchair to perform daily activities given his above-knee amputation, severe malnutrition, phantom limb syndrome, and chronic pain from avascular necrosis. Previous power wheelchair stopped working and patient has become less mobile, now dependent on assistance for ambulation. A new power wheelchair would help him regain some functional independence and minimize his risk for falls

## 2023-02-02 ENCOUNTER — Other Ambulatory Visit (HOSPITAL_COMMUNITY): Payer: Self-pay

## 2023-02-08 ENCOUNTER — Other Ambulatory Visit (HOSPITAL_COMMUNITY): Payer: Self-pay

## 2023-02-12 ENCOUNTER — Telehealth: Payer: Self-pay

## 2023-02-12 ENCOUNTER — Ambulatory Visit (INDEPENDENT_AMBULATORY_CARE_PROVIDER_SITE_OTHER): Payer: Medicare Other | Admitting: Pharmacist

## 2023-02-12 ENCOUNTER — Other Ambulatory Visit: Payer: Self-pay | Admitting: Pharmacist

## 2023-02-12 DIAGNOSIS — Z86718 Personal history of other venous thrombosis and embolism: Secondary | ICD-10-CM

## 2023-02-12 DIAGNOSIS — Z7901 Long term (current) use of anticoagulants: Secondary | ICD-10-CM

## 2023-02-12 LAB — POCT INR: INR: 2.1 (ref 2.0–3.0)

## 2023-02-12 NOTE — Telephone Encounter (Signed)
Patient requesting pain medication. Routing to provider for approval. Patient also reminded that this will be his last fill with [facility] as patient will need to transfer pain medication over to PCP. Patient verbalized understanding.  Valarie Cones, LPN

## 2023-02-12 NOTE — Patient Instructions (Signed)
Patient instructed to take medications as defined in the Anti-coagulation Track section of this encounter.  Patient instructed to take today's dose.  Patient instructed to take 1 & 1/2 tablets of your peach-colored 5mg  strength warfarin tablets Mondays, Tuesdays, Wednesdays, Thursdays and Fridays. On Saturdays and Sundays,take ONLY ONE (1) tablet. Patient verbalized understanding of these instructions.

## 2023-02-12 NOTE — Progress Notes (Signed)
Anticoagulation Management [name] is a 62 y.o. male who reports to the clinic for monitoring of warfarin treatment.    Indication: DVT , History of; Long term current use of oral anticoagulant warfarin, target INR range 2.0 - 3.0.  Duration: indefinite Supervising physician: Carlynn Purl  Anticoagulation Clinic Visit History: Patient does not report signs/symptoms of bleeding or thromboembolism  Other recent changes: No diet, medications, lifestyle changes cited by the patient at this visit.  Anticoagulation Episode Summary     Current INR goal:  2.0-3.0  TTR:  78.4 % (12 y)  Next INR check:  04/09/2023  INR from last check:  2.1 (02/12/2023)  Weekly max warfarin dose:    Target end date:  Indefinite  INR check location:  Anticoagulation Clinic  Preferred lab:    Send INR reminders to:  [facility]   Indications   History of DVT (deep vein thrombosis) [Z86.718] Long term current use of anticoagulant [Z79.01]        Comments:          Anticoagulation Care Providers     Provider Role Specialty Phone number   Cliffton Asters, MD  Infectious Diseases [phone]       Allergies  Allergen Reactions   Dapsone     REACTION: fever   Megestrol    Sulfa Antibiotics Other (See Comments)    "put's me down bad"   Sulfamethoxazole-Trimethoprim     REACTION: fever    Current Outpatient Medications:    ASPIRIN 81 PO, Take 81 mg by mouth daily., Disp: , Rfl:    atorvastatin (LIPITOR) 40 MG tablet, Take 1 tablet (40 mg total) by mouth daily., Disp: 90 tablet, Rfl: 3   bictegravir-emtricitabine-tenofovir AF (BIKTARVY) 50-200-25 MG TABS tablet, Take 1 tablet by mouth daily., Disp: 30 tablet, Rfl: 11   darunavir-cobicistat ([name]) 800-150 MG tablet, TAKE 1 TABLET BY MOUTH EVERY DAY DO NOT CRUSH BREAK OR CHEW TABLETS TAKE WITH FOOD, Disp: 30 tablet, Rfl: 11   Ensure (ENSURE), Take 1 Can by mouth 4 (four) times daily - after meals and at bedtime., Disp: 237 mL, Rfl: 11    gabapentin (NEURONTIN) 400 MG capsule, Take 1 capsule (400 mg total) by mouth 4 (four) times daily., Disp: 360 capsule, Rfl: 3   morphine (MS CONTIN) 30 MG 12 hr tablet, Take 3 tablets (90 mg total) by mouth 3 (three) times daily., Disp: 270 tablet, Rfl: 0   oxyCODONE (OXY IR/ROXICODONE) 5 MG immediate release tablet, Take 1 tablet (5 mg total) by mouth 2 (two) times daily as needed for severe pain., Disp: 60 tablet, Rfl: 0   pantoprazole (PROTONIX) 40 MG tablet, TAKE 1 TABLET(40 MG) BY MOUTH DAILY, Disp: 90 tablet, Rfl: 0   warfarin (COUMADIN) 5 MG tablet, TAKE 1 AND 1/2 TABLETS BY MOUTH ON MONDAYS,  WEDNESDAYS, AND FRIDAYS.  ALL OTHER DAYS, TAKE ONLY 1 TABLET., Disp: 108 tablet, Rfl: 0 Past Medical History:  Diagnosis Date   Chronic kidney disease    History of chicken pox    History of DVT of lower extremity    right   HIV (human immunodeficiency virus infection) ([facility])    Hyperlipidemia    Left hip pain 03/09/2013   Pneumonia    Protein malnutrition ([facility]) 08/2015   Social History   Socioeconomic History   Marital status: Widowed    Spouse name: Not on file   Number of children: 3   Years of education: Not on file   Highest education level:  Not on file  Occupational History    Employer: UNEMPLOYED  Tobacco Use   Smoking status: Every Day    Packs/day: 1.00    Years: 36.00    Additional pack years: 0.00    Total pack years: 36.00    Types: Cigarettes   Smokeless tobacco: Never   Tobacco comments:    1 pk a day   Vaping Use   Vaping Use: Never used  Substance and Sexual Activity   Alcohol use: No    Alcohol/week: 0.0 standard drinks of alcohol   Drug use: Not Currently    Frequency: 2.0 times per week    Types: Marijuana   Sexual activity: Not Currently    Comment: denied condoms; states abstinent since wife passed a long time ago  Other Topics Concern   Not on file  Social History Narrative   Not on file   Social Determinants of Health   Financial Resource  Strain: Not on file  Food Insecurity: Not on file  Transportation Needs: Not on file  Physical Activity: Not on file  Stress: Not on file  Social Connections: Not on file   Family History  Problem Relation Age of Onset   Arthritis Mother    Kidney disease Maternal Grandfather     ASSESSMENT Recent Results: The most recent result is correlated with 42.5 mg per week: Lab Results  Component Value Date   INR 2.1 02/12/2023   INR 2.2 12/19/2022   INR 4.0 (A) 11/30/2022   PROTIME 38.4 (H) 04/01/2012    Anticoagulation Dosing: Description   Take 1 & 1/2 tablets of your peach-colored 5mg  strength warfarin tablets Mondays, Tuesdays, Wednesdays, Thursdayss and Fridays. On Saturdays and Sundays,take ONLY ONE (1) tablet.        INR today: Therapeutic  PLAN Weekly dose was increased by 12% to 47.5 mg per week  Patient Instructions  Patient instructed to take medications as defined in the Anti-coagulation Track section of this encounter.  Patient instructed to take today's dose.  Patient instructed to take 1 & 1/2 tablets of your peach-colored 5mg  strength warfarin tablets Mondays, Tuesdays, Wednesdays, Thursdays and Fridays. On Saturdays and Sundays,take ONLY ONE (1) tablet. Patient verbalized understanding of these instructions.  Patient advised to contact clinic or seek medical attention if signs/symptoms of bleeding or thromboembolism occur.  Patient verbalized understanding by repeating back information and was advised to contact me if further medication-related questions arise. Patient was also provided an information handout.  Follow-up Return in 8 weeks (on 04/09/2023) for Follow up INR.  Elicia Lamp, PharmD, CPP  15 minutes spent face-to-face with the patient during the encounter. 50% of time spent on education, including signs/sx bleeding and clotting, as well as food and drug interactions with warfarin. 50% of time was spent on fingerprick POC INR sample  collection,processing, results determination, and documentation in [email or web address].

## 2023-02-13 ENCOUNTER — Other Ambulatory Visit: Payer: Self-pay | Admitting: Internal Medicine

## 2023-02-13 ENCOUNTER — Other Ambulatory Visit (HOSPITAL_COMMUNITY): Payer: Self-pay

## 2023-02-13 ENCOUNTER — Other Ambulatory Visit: Payer: Self-pay

## 2023-02-13 DIAGNOSIS — G546 Phantom limb syndrome with pain: Secondary | ICD-10-CM

## 2023-02-13 DIAGNOSIS — G894 Chronic pain syndrome: Secondary | ICD-10-CM

## 2023-02-13 MED ORDER — MORPHINE SULFATE ER 30 MG PO TBCR
90.0000 mg | EXTENDED_RELEASE_TABLET | Freq: Three times a day (TID) | ORAL | 0 refills | Status: DC
  Filled 2023-02-13 – 2023-02-14 (×2): qty 270, 30d supply, fill #0

## 2023-02-13 MED ORDER — WARFARIN SODIUM 5 MG PO TABS
ORAL_TABLET | ORAL | 0 refills | Status: DC
  Filled 2023-02-13: qty 108, 89d supply, fill #0
  Filled 2023-02-22: qty 108, 90d supply, fill #0

## 2023-02-13 MED ORDER — OXYCODONE HCL 5 MG PO TABS
5.0000 mg | ORAL_TABLET | Freq: Two times a day (BID) | ORAL | 0 refills | Status: DC | PRN
  Filled 2023-02-13 – 2023-02-14 (×2): qty 60, 30d supply, fill #0

## 2023-02-14 ENCOUNTER — Other Ambulatory Visit (HOSPITAL_COMMUNITY): Payer: Self-pay

## 2023-02-14 ENCOUNTER — Other Ambulatory Visit: Payer: Self-pay

## 2023-02-15 ENCOUNTER — Other Ambulatory Visit: Payer: Self-pay

## 2023-02-22 ENCOUNTER — Other Ambulatory Visit (HOSPITAL_COMMUNITY): Payer: Self-pay

## 2023-02-23 ENCOUNTER — Other Ambulatory Visit (HOSPITAL_COMMUNITY): Payer: Self-pay

## 2023-02-28 ENCOUNTER — Other Ambulatory Visit: Payer: Self-pay

## 2023-03-05 ENCOUNTER — Other Ambulatory Visit (HOSPITAL_COMMUNITY): Payer: Self-pay

## 2023-03-12 ENCOUNTER — Other Ambulatory Visit (HOSPITAL_COMMUNITY): Payer: Self-pay

## 2023-04-05 ENCOUNTER — Other Ambulatory Visit (HOSPITAL_COMMUNITY): Payer: Self-pay

## 2023-04-09 ENCOUNTER — Ambulatory Visit (INDEPENDENT_AMBULATORY_CARE_PROVIDER_SITE_OTHER): Payer: Medicare Other | Admitting: Pharmacist

## 2023-04-09 ENCOUNTER — Ambulatory Visit (INDEPENDENT_AMBULATORY_CARE_PROVIDER_SITE_OTHER): Payer: Medicare Other

## 2023-04-09 ENCOUNTER — Other Ambulatory Visit: Payer: Self-pay

## 2023-04-09 VITALS — BP 113/64 | HR 114 | Temp 98.3°F | Ht 72.0 in | Wt 116.4 lb

## 2023-04-09 DIAGNOSIS — M87052 Idiopathic aseptic necrosis of left femur: Secondary | ICD-10-CM

## 2023-04-09 DIAGNOSIS — Z7901 Long term (current) use of anticoagulants: Secondary | ICD-10-CM

## 2023-04-09 DIAGNOSIS — E43 Unspecified severe protein-calorie malnutrition: Secondary | ICD-10-CM

## 2023-04-09 DIAGNOSIS — Z86718 Personal history of other venous thrombosis and embolism: Secondary | ICD-10-CM | POA: Diagnosis not present

## 2023-04-09 DIAGNOSIS — E278 Other specified disorders of adrenal gland: Secondary | ICD-10-CM | POA: Diagnosis not present

## 2023-04-09 DIAGNOSIS — Z5181 Encounter for therapeutic drug level monitoring: Secondary | ICD-10-CM

## 2023-04-09 LAB — POCT INR: INR: 2.8 (ref 2.0–3.0)

## 2023-04-09 NOTE — Patient Instructions (Addendum)
Thank you for coming to see [place] in clinic Mr. Ingham.  Plan:  - Please return to clinic in 1 month for a urine test  - We referred you for repeat imaging of an adrenal mass that was noted on a CT in 2010.    It was very nice to see you, thank you for allowing [place] to be involved in your care. We look forward to seeing you next time. Please call our clinic at [phone] if you have any questions or concerns. The best time to call is Monday-Friday from 9am-4pm, but there is someone available 24/7. If after hours or the weekend, call the main hospital number at [phone] and ask for the Internal Medicine Resident On-Call. If you need medication refills, please notify your pharmacy one week in advance and they will send [place] a request.   Please make sure to arrive 15 minutes prior to your next appointment. If you arrive late, you may be asked to reschedule.

## 2023-04-09 NOTE — Assessment & Plan Note (Signed)
Patient has a history of chronic pain due to avascular necrosis. Pain is currently managed by Dr. Orvan Falconer who is retiring this year. Per Dr. Blair Dolphin note, he would prefer our clinic to take over his pain management. He has never had any signs of diversion or inappropriate use per Dr. Orvan Falconer. Current medications include morphine 90mg  q8, gabapentin 400mg  q6, and oxycodone 5mg  q12 PRN. Patient reports that they are compliant with these medications.    Plan: - Continue morphine 90mg  q8, gabapentin 400mg  q6, and oxycodone 5mg  q12 PRN - Pain contract signed today

## 2023-04-09 NOTE — Assessment & Plan Note (Signed)
Patient had left adrenal mass noted on CT in 2010. This has not yet been addressed. Patient is comfortable having repeat imaging done.   Plan: - Order for adrenal CT placed

## 2023-04-09 NOTE — Progress Notes (Signed)
CC: Chronic pain f/u  HPI:  [name] is a 62 y.o. male with past medical history of HIV, HLD, DVT, avascular necrosis of femoral head, GERD, shingles, neuropathy that presents for f/u of chronic pain.    Allergies as of 04/09/2023       Reactions   Dapsone    REACTION: fever   Megestrol    Sulfa Antibiotics Other (See Comments)   "put's me down bad"   Sulfamethoxazole-trimethoprim    REACTION: fever        Medication List        Accurate as of April 09, 2023 11:27 AM. If you have any questions, ask your nurse or doctor.          ASPIRIN 81 PO Take 81 mg by mouth daily.   atorvastatin 40 MG tablet Commonly known as: Lipitor Take 1 tablet (40 mg total) by mouth daily.   Biktarvy 50-200-25 MG Tabs tablet Generic drug: bictegravir-emtricitabine-tenofovir AF Take 1 tablet by mouth daily.   Ensure Take 1 Can by mouth 4 (four) times daily - after meals and at bedtime.   gabapentin 400 MG capsule Commonly known as: NEURONTIN Take 1 capsule (400 mg total) by mouth 4 (four) times daily.   morphine 30 MG 12 hr tablet Commonly known as: MS CONTIN Take 3 tablets (90 mg total) by mouth 3 (three) times daily.   oxyCODONE 5 MG immediate release tablet Commonly known as: Oxy IR/ROXICODONE Take 1 tablet (5 mg total) by mouth 2 (two) times daily as needed for severe pain.   pantoprazole 40 MG tablet Commonly known as: PROTONIX TAKE 1 TABLET(40 MG) BY MOUTH DAILY   [name] 800-150 MG tablet Generic drug: darunavir-cobicistat TAKE 1 TABLET BY MOUTH EVERY DAY DO NOT CRUSH BREAK OR CHEW TABLETS TAKE WITH FOOD   warfarin 5 MG tablet Commonly known as: COUMADIN Take as directed by the anticoagulation clinic. If you are unsure how to take this medication, talk to your nurse or doctor. Original instructions: TAKE 1 AND 1/2 TABLETS BY MOUTH ON MONDAYS,  WEDNESDAYS, AND FRIDAYS.  ALL OTHER DAYS, TAKE ONLY 1 TABLET.         Past Medical History:  Diagnosis Date    Chronic kidney disease    History of chicken pox    History of DVT of lower extremity    right   HIV (human immunodeficiency virus infection) ([facility])    Hyperlipidemia    Left hip pain 03/09/2013   Pneumonia    Protein malnutrition ([facility]) 08/2015   Review of Systems:  per HPI.   Physical Exam: Vitals:   04/09/23 1005  BP: 113/64  Pulse: (!) 114  Temp: 98.3 F (36.8 C)  TempSrc: Oral  SpO2: 98%  Weight: 116 lb 6.4 oz (52.8 kg)  Height: 6' (1.829 m)   Constitutional: Well-developed, thin, appears comfortable  HENT: Normocephalic and atraumatic.  Eyes: EOM are normal.  Neck: Normal range of motion.  Cardiovascular: Tachycardic, regular rhythm. No murmurs, rubs, or gallops. Normal radial and PT pulses bilaterally. No LE edema.  Pulmonary: Normal respiratory effort. No wheezes, rales, or rhonchi.   Abdominal: Soft. Non-distended. No tenderness. Normal bowel sounds.    Neurological: Alert and oriented to person, place, and time. Non-focal. Skin: warm and dry.    Assessment & Plan:   See Encounters Tab for problem based charting.  Avascular necrosis of femoral head ([facility]) Patient has a history of chronic pain due to avascular necrosis. Pain is currently managed  by [name] who is retiring this year. Per [name] note, he would prefer our clinic to take over his pain management. He has never had any signs of diversion or inappropriate use per [name]. Current medications include morphine 90mg  q8, gabapentin 400mg  q6, and oxycodone 5mg  q12 PRN. Patient reports that they are compliant with these medications.    Plan: - Continue morphine 90mg  q8, gabapentin 400mg  q6, and oxycodone 5mg  q12 PRN - Pain contract signed today  History of DVT (deep vein thrombosis) Patient has a history of right DVT resulting in right BKA. Currently on long-term warfarin. Patient reports that they are  compliant with this medication. Patient follows w/ [name]. INR from 1 month ago within  therapeutic range.     Plan: - Continue warfarin - Continue f/u w/ [name]  Left adrenal mass Patient had left adrenal mass noted on CT in 2010. This has not yet been addressed. Patient is comfortable having repeat imaging done.   Plan: - Order for adrenal CT placed   Protein-calorie malnutrition, severe Weight unchanged from 3 months ago.  Plan: - Continue ensure shakes    Patient seen with [name].

## 2023-04-09 NOTE — Progress Notes (Signed)
Anticoagulation Management [name] is a 62 y.o. male who reports to the clinic for monitoring of warfarin treatment.    Indication: DVT , History of; long term current use of oral anticoagulant, warfarin with target INR range 2.0 - 3.0.  Duration: indefinite Supervising physician:  Mercie Eon, MD  Anticoagulation Clinic Visit History: Patient does not report signs/symptoms of bleeding or thromboembolism  Other recent changes: No diet, medications, lifestyle changes cited by the patient at this visit.  Anticoagulation Episode Summary     Current INR goal:  2.0-3.0  TTR:  78.6 % (12.2 y)  Next INR check:  06/04/2023  INR from last check:  2.8 (04/09/2023)  Weekly max warfarin dose:    Target end date:  Indefinite  INR check location:  Anticoagulation Clinic  Preferred lab:    Send INR reminders to:  [facility]   Indications   History of DVT (deep vein thrombosis) [Z86.718] Long term current use of anticoagulant [Z79.01]        Comments:          Anticoagulation Care Providers     Provider Role Specialty Phone number   Cliffton Asters, MD  Infectious Diseases [phone]       Allergies  Allergen Reactions   Dapsone     REACTION: fever   Megestrol    Sulfa Antibiotics Other (See Comments)    "put's me down bad"   Sulfamethoxazole-Trimethoprim     REACTION: fever    Current Outpatient Medications:    ASPIRIN 81 PO, Take 81 mg by mouth daily., Disp: , Rfl:    atorvastatin (LIPITOR) 40 MG tablet, Take 1 tablet (40 mg total) by mouth daily., Disp: 90 tablet, Rfl: 3   bictegravir-emtricitabine-tenofovir AF (BIKTARVY) 50-200-25 MG TABS tablet, Take 1 tablet by mouth daily., Disp: 30 tablet, Rfl: 11   darunavir-cobicistat ([name]) 800-150 MG tablet, TAKE 1 TABLET BY MOUTH EVERY DAY DO NOT CRUSH BREAK OR CHEW TABLETS TAKE WITH FOOD, Disp: 30 tablet, Rfl: 11   Ensure (ENSURE), Take 1 Can by mouth 4 (four) times daily - after meals and at bedtime., Disp: 237 mL,  Rfl: 11   gabapentin (NEURONTIN) 400 MG capsule, Take 1 capsule (400 mg total) by mouth 4 (four) times daily., Disp: 360 capsule, Rfl: 3   morphine (MS CONTIN) 30 MG 12 hr tablet, Take 3 tablets (90 mg total) by mouth 3 (three) times daily., Disp: 270 tablet, Rfl: 0   oxyCODONE (OXY IR/ROXICODONE) 5 MG immediate release tablet, Take 1 tablet (5 mg total) by mouth 2 (two) times daily as needed for severe pain., Disp: 60 tablet, Rfl: 0   pantoprazole (PROTONIX) 40 MG tablet, TAKE 1 TABLET(40 MG) BY MOUTH DAILY, Disp: 90 tablet, Rfl: 0   warfarin (COUMADIN) 5 MG tablet, TAKE 1 AND 1/2 TABLETS BY MOUTH ON MONDAYS,  WEDNESDAYS, AND FRIDAYS.  ALL OTHER DAYS, TAKE ONLY 1 TABLET., Disp: 108 tablet, Rfl: 0 Past Medical History:  Diagnosis Date   Chronic kidney disease    History of chicken pox    History of DVT of lower extremity    right   HIV (human immunodeficiency virus infection) (HCC)    Hyperlipidemia    Left hip pain 03/09/2013   Pneumonia    Protein malnutrition (HCC) 08/2015   Social History   Socioeconomic History   Marital status: Widowed    Spouse name: Not on file   Number of children: 3   Years of education: Not on file  Highest education level: Not on file  Occupational History    Employer: UNEMPLOYED  Tobacco Use   Smoking status: Every Day    Packs/day: 1.00    Years: 36.00    Additional pack years: 0.00    Total pack years: 36.00    Types: Cigarettes   Smokeless tobacco: Never   Tobacco comments:    1 pk a day   Vaping Use   Vaping Use: Never used  Substance and Sexual Activity   Alcohol use: No    Alcohol/week: 0.0 standard drinks of alcohol   Drug use: Not Currently    Frequency: 2.0 times per week    Types: Marijuana   Sexual activity: Not Currently    Comment: denied condoms; states abstinent since wife passed a long time ago  Other Topics Concern   Not on file  Social History Narrative   Not on file   Social Determinants of Health   Financial  Resource Strain: Not on file  Food Insecurity: Not on file  Transportation Needs: Not on file  Physical Activity: Not on file  Stress: Not on file  Social Connections: Not on file   Family History  Problem Relation Age of Onset   Arthritis Mother    Kidney disease Maternal Grandfather     ASSESSMENT Recent Results: The most recent result is correlated with 47.5 mg per week: Lab Results  Component Value Date   INR 2.8 04/09/2023   INR 2.1 02/12/2023   INR 2.2 12/19/2022   PROTIME 38.4 (H) 04/01/2012    Anticoagulation Dosing: Description   Take 1 & 1/2 tablets of your peach-colored 5mg  strength warfarin tablets on Sundays, Tuesdays, Thursdays, and Saturdays. On Mondays, Wednesdays and Fridays, take ONLY ONE (1) tablet.        INR today: Therapeutic  PLAN Weekly dose was decreased by 5% to 45 mg per week  Patient Instructions  Patient instructed to take medications as defined in the Anti-coagulation Track section of this encounter.  Patient instructed to take today's dose.  Patient instructed to take 1 & 1/2 tablets of your peach-colored 5mg  strength warfarin tablets on Sundays, Tuesdays, Thursdays, and Saturdays. On Mondays, Wednesdays and Fridays, take ONLY ONE (1) tablet.  Patient verbalized understanding of these instructions.  Patient advised to contact clinic or seek medical attention if signs/symptoms of bleeding or thromboembolism occur.  Patient verbalized understanding by repeating back information and was advised to contact me if further medication-related questions arise. Patient was also provided an information handout.  Follow-up Return in 8 weeks (on 06/04/2023) for Follow up INR.  Elicia Lamp, PharmD, CPP  15 minutes spent face-to-face with the patient during the encounter. 50% of time spent on education, including signs/sx bleeding and clotting, as well as food and drug interactions with warfarin. 50% of time was spent on fingerprick POC INR sample  collection,processing, results determination, and documentation in [email or web address].

## 2023-04-09 NOTE — Assessment & Plan Note (Signed)
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 Currently on long-term warfarin. Patient reports that they are  compliant with this medication. Patient follows w/ Dr. Alexandria Lodge. INR from 1 month ago within therapeutic range.     Plan: - Continue warfarin - Continue f/u w/ Dr. Alexandria Lodge

## 2023-04-09 NOTE — Patient Instructions (Signed)
Patient instructed to take medications as defined in the Anti-coagulation Track section of this encounter.  Patient instructed to take today's dose.  Patient instructed to take 1 & 1/2 tablets of your peach-colored 5mg  strength warfarin tablets on Sundays, Tuesdays, Thursdays, and Saturdays. On Mondays, Wednesdays and Fridays, take ONLY ONE (1) tablet.  Patient verbalized understanding of these instructions.

## 2023-04-09 NOTE — Assessment & Plan Note (Signed)
Weight unchanged from 3 months ago.  Plan: - Continue ensure shakes

## 2023-04-10 NOTE — Addendum Note (Signed)
Addended by: Carlynn Purl C on: 04/10/2023 10:32 AM   Modules accepted: Orders

## 2023-04-11 ENCOUNTER — Other Ambulatory Visit (HOSPITAL_COMMUNITY): Payer: Self-pay

## 2023-04-12 ENCOUNTER — Other Ambulatory Visit (HOSPITAL_COMMUNITY): Payer: Self-pay

## 2023-04-13 NOTE — Progress Notes (Signed)
Internal Medicine Clinic Attending  Case discussed with the resident at the time of the visit.  We reviewed the resident's history and exam and pertinent patient test results.  I agree with the assessment, diagnosis, and plan of care documented in the resident's note.  

## 2023-05-02 ENCOUNTER — Other Ambulatory Visit (HOSPITAL_COMMUNITY): Payer: Self-pay

## 2023-05-04 ENCOUNTER — Other Ambulatory Visit (HOSPITAL_COMMUNITY): Payer: Self-pay

## 2023-05-09 ENCOUNTER — Other Ambulatory Visit (HOSPITAL_COMMUNITY): Payer: Self-pay

## 2023-05-10 ENCOUNTER — Other Ambulatory Visit: Payer: Self-pay

## 2023-05-10 ENCOUNTER — Other Ambulatory Visit (HOSPITAL_COMMUNITY): Payer: Self-pay

## 2023-05-29 ENCOUNTER — Encounter: Payer: Self-pay | Admitting: Physical Therapy

## 2023-05-29 ENCOUNTER — Ambulatory Visit: Payer: Medicare Other | Attending: Internal Medicine | Admitting: Physical Therapy

## 2023-05-29 DIAGNOSIS — R269 Unspecified abnormalities of gait and mobility: Secondary | ICD-10-CM | POA: Insufficient documentation

## 2023-05-29 DIAGNOSIS — Z89611 Acquired absence of right leg above knee: Secondary | ICD-10-CM | POA: Insufficient documentation

## 2023-05-29 NOTE — Therapy (Signed)
OUTPATIENT PHYSICAL THERAPY WHEELCHAIR EVALUATION   Patient Name: [name] MRN: [id number] DOB:[birth year], 62 y.o., male Today's Date: 05/29/2023  END OF SESSION:  PT End of Session - 05/29/23 1007     Visit Number 1    Number of Visits 1    Authorization Type Medicare and secondary Medicaid [place]    PT Start Time 1008    PT Stop Time 1100    PT Time Calculation (min) 52 min    Equipment Utilized During Treatment Gait belt    Activity Tolerance Patient tolerated treatment well    Behavior During Therapy WFL for tasks assessed/performed             Past Medical History:  Diagnosis Date   Chronic kidney disease    History of chicken pox    History of DVT of lower extremity    right   HIV (human immunodeficiency virus infection) (HCC)    Hyperlipidemia    Left hip pain 03/09/2013   Pneumonia    Protein malnutrition (HCC) 08/2015   Past Surgical History:  Procedure Laterality Date   APPENDECTOMY  1962   LEG AMPUTATION ABOVE KNEE  2010   right leg   Patient Active Problem List   Diagnosis Date Noted   Homelessness 11/30/2022   Hospice care patient 02/23/2022   Palliative care patient 02/23/2022   Positive occult stool blood test 12/29/2021   Tachycardia 12/23/2021   Polycythemia 07/02/2020   Uses powered wheelchair 09/19/2019   Long term current use of opiate analgesic 12/17/2015   Pressure ulcer 09/13/2015   Protein-calorie malnutrition, severe 09/13/2015   Avascular necrosis of femoral head (HCC) 05/14/2015   Left adrenal mass (HCC) 05/14/2015   Preventative health care 03/09/2013   Phantom limb syndrome with pain (HCC) 03/21/2012   Long term current use of anticoagulant 11/20/2010   ERECTILE DYSFUNCTION, ORGANIC 04/12/2010   GERD 08/23/2009   History of DVT (deep vein thrombosis) 07/19/2009   HYPOGONADISM 07/15/2009   Pulmonary nodule 07/15/2009   Status post above knee amputation (HCC) 07/15/2009   Human immunodeficiency virus (HIV) disease  (HCC) 11/10/2006   SHINGLES, RECURRENT 11/10/2006   Hyperlipemia 11/10/2006   History of alcohol abuse 11/10/2006   CIGARETTE SMOKER 11/10/2006   PERIPHERAL NEUROPATHY 11/10/2006   ALLERGIC RHINITIS 11/10/2006    PCP: Kathleen Lime, MD REFERRING PROVIDER: Mercie Eon, MD  THERAPY DIAG:  Abnormality of gait and mobility  Rationale for Evaluation and Treatment Rehabilitation  SUBJECTIVE:  SUBJECTIVE STATEMENT: Pt presents for wheelchair evaluation. Patient has PMH of stage 3 pressure ulcer on sacrum over last approximately 7 years with cracked opening, HIV, R AKA, L adrenal mass, and history of DVT. Patient reports that his current chair is likely around 68+ years old and has been breaking down.  Patient uses a walker with 2WW for transfers and short distances. Patient reports 6 recent falls falling off a couch. Reports hitting the back of his head with one of these falls but reports no other injuries. Patient reports that he has a ramp he is getting set up so his home is accessible.  PRECAUTIONS: Fall  RED FLAGS: None   WEIGHT BEARING RESTRICTIONS No   OCCUPATION: Unemployed  PLOF:  Requires PWC as primary mode of transportation and 2WW for short distances within home that   PATIENT GOALS: "Get a new chair that doesn't causes wounds on my bottom."    MEDICAL HISTORY:  Primary diagnosis onset: 01/15/2023 (referral date)   Medical Diagnosis with ICD-10 code: Z89.611 (ICD-10-CM) - Hx of AKA (above knee amputation), right (HCC)   [] Progressive disease  Relevant future surgeries:     Height: 6' Weight: 116lbs Explain recent changes or trends in weight:  Patient reports only minor drops in weight intermittently; otherwise relatively consistent    History:  Past Medical History:  Diagnosis  Date   Chronic kidney disease    History of chicken pox    History of DVT of lower extremity    right   HIV (human immunodeficiency virus infection) (HCC)    Hyperlipidemia    Left hip pain 03/09/2013   Pneumonia    Protein malnutrition (HCC) 08/2015       Cardio Status:  Functional Limitations:   [x] Intact  []  Impaired      Respiratory Status:  Functional Limitations:   [x] Intact  [] Impaired   [] SOB [] COPD [] O2 Dependent ______LPM  [] Ventilator Dependent  Resp equip:                                                     Objective Measure(s):   Orthotics:   [x] Amputee:       R AKA                                                    [] Prosthesis:        HOME ENVIRONMENT:  [x] House [] Condo/town home [] Apartment [] Asst living [] LTCF         [x] Own  [] Rent   [] Lives alone [x] Lives with others -   mom and brother                        Hours without assistance: 12+ hours a day  [x] Home is accessible to patient as patient has ramp purchased that is in process of getting added to house to allow PWC to be driven into home                               Storage of wheelchair:  [] In home   [x] Other Comments: patient reports having a ramp he is getting  set up to get chair into home currently so he can store chair in home       COMMUNITY :  TRANSPORTATION:  [] Car [] [place] [] Adapted w/c Lift []  Ambulance [x] Other:   Truck              [] Sits in wheelchair during transport   Where is w/c stored during transport? [place] on back of truck [x] [place]  []  [employer]  r   [] Self-Driver       Drive while in  Biomedical scientist [] yes [x] no   Employment and/or school:  Specific requirements pertaining to mobility: uses wheelchair to get to and from appointments, needed for safe ambulation in home, patient currently using walker but is very high fall risk and is not safe for these distances      Other:  COMMUNICATION:  Verbal Communication  [x] WFL [] receptive [] WFL [] expressive  [] Understandable  [] Difficult to understand  [] non-communicative  Primary Language: English 2nd:_____________  Communication provided by:[x] Patient [] Family [] Caregiver [] Translator   [] Uses an augmentative communication device     Manufacturer/Model :                                         MOBILITY/BALANCE:  Sitting Balance  Standing Balance  Transfers  Ambulation   [x] WFL      [] WFL  [] Independent  []  Independent   [] Uses UE for balance in sitting Comments:  [] Uses UE/device for stability Comments:  [x]  Min assist - with use of 2WW, very unsteady and high risk for falls []  Ambulates independently with       device:___________________      []  Mod assist  []  Able to ambulate ______ feet        safely/functionally/independently   []  Min assist  []  Min assist  []  Max assist [x]  Non-functional ambulator         History/High risk of falls   []  Mod assist  [x]  Mod assist - requires external structure for any length of prolonged standing balance []  Dependent  []  Unable to ambulate   []  Max  assist  []  Max assist  Transfer method:[] 1 person [] 2 person [] sliding board [] squat pivot [x] stand pivot [] mechanical patient lift  [x] other: with use of 2WW  []  Unable  []  Unable    Fall History: # of falls in the past 6 months? 6 # of "near" falls in the past 6 months? Reports none    CURRENT SEATING / MOBILITY:  Current Mobility Device: [] None [x] Cane/Walker [] Manual [] Dependent [] Dependent w/ Tilt rScooter  [x] Power (type of control):  Environmental manager for transfers and Museum/gallery exhibitions officer for mobility)  Manufacturer: Conservation officer, historic buildings: [id number] Serial #:   Size:  Color: Red Age: ~10 years (per patient)  Purchased by whom: Insurance  Current condition of mobility base: original cuhion completely worn out, tilt in space feature has been repaired and not fully functional on joystick, arm rests are peeling, general wear and tear   Current seating system:                                                                        Age of  seating system:    Describe posture in present seating system: Patient forward flexed and R windswept with notable kyphosis    Is the current mobility meeting medical necessity?:  [] Yes [x] No Describe: Ongoing skin breakdown continues, general disrepair                            Ability to complete Mobility-Related Activities of Daily Living (MRADL's) with Current Mobility Device:   Move room to room  [] Independent  [] Min [x] Mod [] Max assist  [] Unable  Comments: Patient reports depending on family largely to cook meals; patient transfers with walker as needed but is a very high falls risk and non functional ambulator in a manner that is safe  Meal prep  [] Independent  [] Min [] Mod [x] Max assist  [] Unable    Feeding  [x] Independent  [] Min [] Mod [] Max assist  [] Unable    Bathing  [x] Independent  [] Min [] Mod [] Max assist  [] Unable    Grooming  [x] Independent  [] Min [] Mod [] Max assist  [] Unable    UE dressing  [x] Independent  [] Min [] Mod [] Max assist  [] Unable    LE dressing  [x] Independent   [] Min [] Mod [] Max assist  [] Unable    Toileting  [x] Independent  [] Min [] Mod [] Max assist  [] Unable    Bowel Mgt: [x]  Continent []  Incontinent []  Accidents []  Diapers []  Colostomy []  Bowel Program:  Bladder Mgt: [x]  Continent []  Incontinent []  Accidents []  Diapers []  Urinal []  Intermittent Cath []  Indwelling Cath []  Supra-pubic Cath     Current Mobility Equipment Trialed/ Ruled Out:    Does not meet mobility needs due to:    Mark all boxes that indicate inability to use the specific equipment listed     Meets needs for safe  independent functional  ambulation  / mobility    Risk of  Falling or History of Falls    Enviromental limitations      Cognition    Safety concerns with  physical ability    Decreased / limitations endurance  & strength     Decreased / limitations  motor skills  & coordination    Pain    Pace /  Speed    Cardiac and/or  respiratory condition    Contra - indicated  by diagnosis   Cane/Crutches  []   [x]   []   []   [x]   [x]   [x]   [x]   [x]   []   []    Walker / Rollator  []  NA   []   [x]   []   []   [x]   [x]   [x]   [x]   [x]   []   []     Manual Wheelchair [id number]:  []  NA  []   []   []   []   [x]   [x]   [x]   [x]   [x]   []   []    Manual W/C (K0005) with power assist  []  NA  []   []   []   []   [x]   [x]   [x]   [x]   [x]   []   []    Scooter  []  NA  []   []   []   []   [x]   []   []   [x]   []   []   []    Power Wheelchair: standard joystick  []  NA  [x]   []   []   []   []   []   []   []   []   []   []    Power Wheelchair: alternative controls  [x]  NA  []   []   []   []   []   []   []   []   []   []   []   Summary:  The least costly alternative for independent functional mobility was found to be:    []  Crutch/Cane  []  Walker []  Manual w/c  []  Manual w/c with power assist   []  Scooter   [x]  Power w/c std joystick   []  Power w/c alternative control        []  Requires dependent care mobility device   Cabin crew for [employer] skills are adequate for safe mobility equipment operation  [x]   Yes []   No  Patient is willing and motivated to use recommended mobility equipment  [x]   Yes []   No       []  Patient is unable to safely operate mobility equipment independently and requires dependent care equipment Comments:           SENSATION and SKIN ISSUES:  Sensation []  Intact  [x]  Impaired []  Absent []  Hyposensate []  Hypersensate  []  Defensiveness  Location(s) of impairment: bilateral UE, phantom limb pain on RLE   Pressure Relief Method(s):  []  Lean side to side to offload (without risk of falling)  []   W/C push up (4+ times/hour for 15+ seconds) []  Stand up (without risk of falling)    []  Other: (Describe): Effective pressure relief method(s) above can be performed consistently throughout the day: [] Yes  [x]  No If not, Why?: L shoulder tends to "dislocate and pop back in" per patient when he tries to push up through arms; reports pain on this side  Skin Integrity Risk:        []  Low risk           []  Moderate risk            [x]  High risk  If high risk, explain: Prior and current history of skin breakdown   Patient is limited by pain in upper extremities; patient has history and ongoing skin wounds on sacrum and requires tilt in space to help off load area as patient unsafe with transfers  Skin Issues/Skin Integrity  Current skin Issues  [x]  Yes []  No []  Intact  [x]   Red area  []   Open area  [x]  Scar tissue  [x]  At risk from prolonged sitting  Where: sacrum wound, cracks open intermittently History of Skin Issues  [x]  Yes []  No Where : sacrum When: 7 years ago Stage: 3 (reports cracks open intermittently now) Hx of skin flap surgeries  []  Yes [x]  No Where:  When:  Pain: [x]  Yes []  No   Pain Location(s): R leg Intensity scale: (0-10) : 10 How does pain interfere with mobility and/or MRADLs? - increased risk for falls with transfers due to reduced weight bearing on RLE     MAT EVALUATION:  Neuro-Muscular Status: (Tone, Reflexive, Responses, etc.)     [x]   Intact   []  Spasticity:  []  Hypotonicity  []  Fluctuating  []  Muscle Spasms  []  Poor Righting Reactions/Poor Equilibrium Reactions  []  Primal Reflex(s):    Comments: Increased tremors with active ROM with weightbearing activities, increases safety risk with transfers           COMMENTS:    POSTURE:     Comments:  Pelvis Anterior/Posterior:  []  Neutral   [x]  Posterior  []  Anterior  []  Fixed - No movement []  Tendency away from neutral [x]  Flexible [x]  Self-correction []  External correction Obliquity (viewed from front)  []  WFL []  R Obliquity [x]  L Obliquity  []  Fixed - No movement [x]  Tendency away from neutral [x]  Flexible [x]  Self-correction []   External correction Rotation  [x]  WFL []  R anterior []  L anterior  []  Fixed - No movement []  Tendency away from neutral []  Flexible []  Self-correction []  External correction Tonal Influence Pelvis:  [x]  Normal []  Flaccid []   Low tone []  Spasticity []  Dystonia []  Pelvis thrust []  Other:    Trunk Anterior/Posterior:  []  WFL [x]  Thoracic kyphosis []  Lumbar lordosis  []  Fixed - No movement [x]  Tendency away from neutral [x]  Flexible [x]  Self-correction []  External correction  [x]  WFL []  Convex to left  []  Convex to right []  S-curve   []  C-curve []  Multiple curves []  Tendency away from neutral []  Flexible []  Self-correction []  External correction Rotation of shoulders and upper trunk:  []  Neutral [x]  Left-anterior [x]  Right- anterior []  Fixed- no movement [x]  Tendency away from neutral []  Flexible []  Self correction []  External correction Tonal influence Trunk:  []  Normal []  Flaccid []  Low tone []  Spasticity []  Dystonia []  Other:  Head & Neck  [x]  Functional []  Flexed    []  Extended []  Rotated right  []  Rotated left []  Laterally flexed right []  Laterally flexed left []  Cervical hyperextension   [x]  Good head control []  Adequate head control []  Limited head control []  Absent head control Describe tone/movement of head and neck:      Lower Extremity Measurements: LE ROM:  Active ROM Right 05/29/2023 Left 05/29/2023  Hip flexion [facility]  Hip extension Minor contractions in flexion resulting in ~30 degrees forward flexion with gait Minor contractions resulting in flexion ~30 degrees forward flexion with gait  Hip abduction    Hip adduction    Knee flexion NA WFL  Knee extension    Ankle dorsiflexion    Ankle plantarflexion NA WFL   (Blank rows = not tested)  LE MMT:  MMT Right 05/29/2023 Left 05/29/2023  Hip flexion WFL 4/5  Hip extension    Hip abduction    Hip adduction    Knee flexion NA 4-/5  Knee extension    Ankle dorsiflexion NA   Ankle plantarflexion NA    (Blank rows = not tested)  Hip positions:  []  Neutral   []  Abducted   [x]  Adducted  []  Subluxed   []  Dislocated   []  Fixed   [x]  Tendency away from neutral [x]  Flexible [x]  Self-correction []   External correction   Hip Windswept:[]  Neutral  [x]  Right    []  Left  []  Subluxed   []  Dislocated   []  Fixed   []  Tendency away from neutral [x]  Flexible [x]  Self-correction []  External correction  LE Tone: []  Normal []  Low tone []  Spasticity []  Flaccid []  Dystonia []  Rocks/Extends at hip []  Thrust into knee extension []  Pushes legs downward into footrest X Tremors with active movement noted  Foot positioning: ROM Concerns: WFL with foot rest on left lower extremity   LE Edema: No major edema noted on evaluation on left lower extremity  UE Measurements:  UPPER EXTREMITY ROM:   Active ROM Right 05/29/2023 Left 05/29/2023  Shoulder flexion 160 130  Shoulder abduction    Shoulder adduction    Elbow flexion WFL WFL  Elbow extension [facility] WFL  Wrist flexion    Wrist extension    (Blank rows = not tested)  UPPER EXTREMITY MMT:  Not tested due to reports of LLE UE shoulder pain and patient reporting history of subluxations   Shoulder Posture:  Right Tendency towards Left  []   Functional []    []   Elevation []    []   Depression []    [x]   Protraction [x]    []   Retraction []    [x]   Internal rotation [x]    []   External rotation []    []   Subluxed []     UE Tone: [x]  Normal []  Flaccid []  Low tone []  Spasticity  []  Dystonia []  Other:   Wrist/Hand: Handedness: [x]  Right   []  Left   []  NA: Comments:  Right  Left  [x]   WNL [x]    []   Limitations []    []   Contractures []    []   Fisting []    []   Tremors []    []   Weak grasp []    []   Poor dexterity []    []   Hand movement non functional []    []   Paralysis []     OBJECTIVE MEASURES:  TUG: 74 seconds with 2WW (CGA-minA) = High Falls Risk   MOBILITY BASE RECOMMENDATIONS and JUSTIFICATION:  MOBILITY BASE  JUSTIFICATION   Manufacturer:  [employer]  Model:   J6                         Color: Red Seat Width:  16" Seat Depth 18"   []  Manual mobility base (continue below)   []  Scooter/POV  [x]  Power mobility base    Number of hours per day spent in above selected mobility base: > 8+ hours  Typical daily mobility base use Schedule:    [x]  is not a safe, functional ambulator  [x]  limitation prevents from completing a MRADL(s) within a reasonable time frame    [x]  limitation places at high risk of morbidity or mortality secondary to  the attempts to perform a    MRADL(s)  []  limitation prevents accomplishing a MRADL(s) entirely  [x]  provide independent mobility  [x]  equipment is a lifetime medical need  [x]  walker or cane inadequate  [x]  any type manual wheelchair      inadequate  [x]  scooter/POV inadequate      []  requires dependent mobility       POWER MOBILITY      []  Scooter/POV    []  can safely operate   []  can safely transfer   []  has adequate trunk stability   []  cannot functionally propel  manual wheelchair    [x]  Power mobility base    [x]  non-ambulatory in a way that is functionally safe  [x]  cannot functionally propel manual wheelchair   [x]  cannot functionally and safely      operate scooter/POV  [x]  can safely operate power       wheelchair  [x]  home is accessible once ramp is setup that patient already has purchased [x]  willing to use power wheelchair     Tilt  [x]  Powered tilt on powered chair  []  Powered tilt on manual chair  []  Manual tilt on manual chair Comments:  [x]  change position for pressure      relief/cannot weight shift   [x]  change position against      gravitational force on head and      shoulders   [x]  decrease pain  []  blood pressure management   []  control autonomic dysreflexia  []  decrease respiratory distress  []  management of spasticity  []  management of low tone  []  facilitate postural control   [x]  rest periods   []  control edema  []  increase sitting tolerance   [x]  aid with transfers     Recline   []  Power recline on power chair  []  Manual recline on manual chair  Comments:    []  intermittent catheterization  []  manage spasticity  []   accommodate femur to back angle  []  change position for pressure relief/cannot weight shift rhigh risk of pressure sore development  []  tilt alone does not accomplish     effective pressure relief, maximum pressure relief achieved at -      _______ degrees tilt   _______ degrees recline   []  difficult to transfer to and from bed []  rest periods and sleeping in chair  []  repositioning for transfers  []  bring to full recline for ADL care  []  clothing/diaper changes in chair  []  gravity PEG tube feeding  []  head positioning  []  decrease pain  []  blood pressure management   []  control autonomic dysreflexia  []  decrease respiratory distress  []  user on ventilator     Elevator on mobility base  []  Power wheelchair  []  Scooter  []  increase Indep in transfers   []  increase Indep in ADLs    []  bathroom function and safety  []  kitchen/cooking function and safety  []  shopping  []  raise height for communication at standing level  []  raise height for eye contact which reduces cervical neck strain and pain  []  drive at raised height for safety and navigating crowds  []  Other:   []  Vertical position system  (anterior tilt)     (Drive locks-out)    []  Stand       (Drive enabled)  []  independent weight bearing  []  decrease joint contractures  []  decrease/manage spasticity  []  decrease/manage spasms  []  pressure distribution away from   scapula, sacrum, coccyx, and ischial tuberosity  []  increase digestion and elimination   []  access to counters and cabinets  []  increase reach  []  increase interaction with others at eye level, reduces neck strain  []  increase performance of       MRADL(s)      Power elevating legrest    []  Center mount (Single) 85-170 degrees       []  Standard (Pair) 100-170 degrees  []  position legs at 90 degrees, not available with std power ELR  []  center mount tucks into chair to decrease turning radius in home, not available with std power ELR  []  provide change  in position for LE  []  elevate legs during recline    []  maintain placement of feet on      footplate  []  decrease edema  []  improve circulation  []  actuator needed to elevate legrest  []  actuator needed to articulate legrest preventing knees from flexing  []  Increase ground clearance over      curbs  []   STD (pair) independently                     elevate legrest   POWER WHEELCHAIR CONTROLS      Controls/input device  []  Expandable  [x]  Non-expandable  []  Proportional  [x]  Right Hand []  Left Hand  []  Non-proportional/switches/head-array  []  Electrical/proximity         []   Mechanical      Manufacturer:___________________   Type:_____Joystick______________ [x]  provides access for controlling wheelchair  [x]  programming for accurate control  []  progressive disease/changing condition  []  required for alternative drive      controls       []  lacks motor control to operate  proportional drive control  []  unable to understand proportional controls  []  limited movement/strength  []  extraneous movement / tremors / ataxic / spastic       [  x] Upgraded electronics controller/harness    [x]  Single power (tilt or recline)   []  Expandable    [x]  Non-expandable plus   []  Multi-power (tilt, recline, power legrest, power seat lift, vertical positioning system, stand)  [x]  allows input device to communicate with drive motors  [x]  harness provides necessary connections between the controller, input device, and seat functions     [x]  needed in order to operate power seat functions through joystick/ input device  []  required for alternative drive controls     []  Enhanced display  []  required to connect all alternative drive controls   []  required for upgraded joystick      (lite-throw, heavy duty, micro)  []  Allows user to see in which mode and drive the wheelchair is set; necessary for alternate controls       []  Upgraded tracking electronics  []  correct tracking when on uneven surfaces  makes switch driving more efficient and less fatiguing  []  increase safety when driving  []  increase ability to traverse thresholds    []  Safety / reset / mode switches     Type:    []  Used to change modes and stop the wheelchair when driving     [x]  Mount for joystick / input device/switches  [x]  swing away for access or transfers   [x]  attaches joystick / input device / switches to wheelchair   [x]  provides for consistent access  []  midline for optimal placement    []  Attendant controlled joystick plus     mount  []  safety  []  long distance driving  []  operation of seat functions  []  compliance with transportation regulations    [x]  Battery  [x]  required to power (power assist / scooter/ power wc / other):   []  Power inverter (24V to 12V)  []  required for ventilator / respiratory equipment / other:     CHAIR OPTIONS MANUAL & POWER      Armrests   [x]  adjustable height []  removable  []  swing away []  fixed  [x]  flip back  []  reclining  [x]  full length pads []  desk []  tube arms []  gel pads  [x]  provide support with elbow at 90    [x]  remove/flip back/swing away for  transfers  [x]  provide support and positioning of upper body    [x]  allow to come closer to table top  []  remove for access to tables  []  provide support for w/c tray  []  change of height/angles for variable activities   []  Elbow support / Elbow stop  []  keep elbow positioned on arm pad  []  keep arms from falling off arm pad  during tilt and/or recline   Upper Extremity Support  []  Arm trough  []   R  []   L  Style:  []  swivel mount []  fixed mount   []  posterior hand support  []   tray  []  full tray  []  joystick cut out  []   R  []   L  Style:  []  decrease gravitational pull on      shoulders  []  provide support to increase UE  function  []  provide hand support in natural    position  []  position flaccid UE  []  decrease subluxation    []  decrease edema       []  manage spasticity   []  provide midline positioning  []   provide work surface  []  placement for AAC/ Computer/ EADL       Hangers/ Legrests   []  ______ degree  []   Elevating []  articulating  []  swing away []  fixed []  lift off  []  heavy duty  []  adjustable knee angle  []  adjustable calf panel   []  longer extension tube              []  provide LE support  []  maintain placement of feet on      footplate   []  accommodate lower leg length  []  accommodate to hamstring       tightness  []  enable transfers  []  provide change in position for LE's  []  elevate legs during recline    []  decrease edema  []  durability      Foot support   [x]  footplate []  R []  L [x]  flip up           []  Depth adjustable   [x]  angle adjustable  []  foot board/one piece    [x]  provide foot support  []  accommodate to ankle ROM  [x]  allow foot to go under wheelchair base  [x]  enable transfers     []  Shoe holders  []  position foot    []  decrease / manage spasticity  []  control position of LE  []  stability    []  safety     []  Ankle strap/heel      loops  []  support foot on foot support  []  decrease extraneous movement  []  provide input to heel   []  protect foot     []  Amputee adapter []  R  []  L     Style:                  Size:  []  Provide support for stump/residual extremity    []  Transportation tie-down  []  to provide crash tested tie-down brackets    []  Crutch/cane holder    []  O2 holder    []  IV hanger   []  Ventilator tray/mount    []  stabilize accessory on wheelchair       Component  Justification     [x]  Seat cushion - Solution Skin protection and positioning     []  accommodate impaired sensation  [x]  decubitus ulcers present or history  [x]  unable to shift weight  [x]  increase pressure distribution  []  prevent pelvic extension  []  custom required "off-the-shelf"    seat cushion will not accommodate deformity  [x]  stabilize/promote pelvis alignment  [x]  stabilize/promote femur alignment  []  accommodate obliquity  []  accommodate multiple deformity   []  incontinent/accidents  []  low maintenance     []  seat mounts                 []  fixed []  removable  []  attach seat platform/cushion to wheelchair frame    []  Seat wedge    []  provide increased aggressiveness of seat shape to decrease sliding  down in the seat  []  accommodate ROM        []  Cover replacement   []  protect back or seat cushion  []  incontinent/accidents    []  Solid seat / insert    []  support cushion to prevent      hammocking  []  allows attachment of cushion to mobility base    []  Lateral pelvic/thigh/hip     support (Guides)     []  decrease abduction  []  accommodate pelvis  []  position upper legs  []  accommodate spasticity  []  removable for transfers     []  Lateral pelvic/thigh      supports mounts  []  fixed   []  swing-away   []   removable  []  mounts lateral pelvic/thigh supports     []  mounts lateral pelvic/thigh supports swing-away or removable for transfers    []  Medial thigh support ([place]mel)  [] decrease adduction  [] accommodate ROM  []  remove for transfers   []  alignment      []  Medial thigh   []  fixed      support mounts      []  swing-away   []  removable  []  mounts medial thigh supports   []  Mounts medial supports swing- away or removable for transfers       Component  Justification   [x]  Back  - Sport Back     [x]  provide posterior trunk support []  facilitate tone  [x]  provide lumbar/sacral support []  accommodate deformity  [x]  support trunk in midline   []  custom required "off-the-shelf" back support will not accommodate deformity   []  provide lateral trunk support []  accommodate or decrease tone            []  Back mounts  []  fixed  []  removable  []  attach back rest/cushion to wheelchair frame   []  Lateral trunk      supports  []  R []  L  []  decrease lateral trunk leaning  []  accommodate asymmetry    []  contour for increased contact  []  safety    []  control of tone    []  Lateral trunk      supports mounts  []  fixed  []  swing-away   []  removable   []  mounts lateral trunk supports     []  Mounts lateral trunk supports swing-away or removable for transfers   []  Anterior chest      strap, vest     []  decrease forward movement of shoulder  []  decrease forward movement of trunk  []  safety/stability  []  added abdominal support  []  trunk alignment  []  assistance with shoulder control   []  decrease shoulder elevation    [x]  Headrest      []  provide posterior head support  []  provide posterior neck support  []  provide lateral head support  []  provide anterior head support  []  support during tilt and recline  []  improve feeding     []  improve respiration  []  placement of switches  []  safety    []  accommodate ROM   []  accommodate tone  []  improve visual orientation   [x]  Headrest           []  fixed [x]  removable []  flip down      Mounting hardware   []  swing-away laterals/switches  [x]  mount headrest   [x]  mounts headrest flip down or  removable for transfers  []  mount headrest swing-away laterals   []  mount switches     []  Neck Support    []  decrease neck rotation  []  decrease forward neck flexion   Pelvic Positioner    [x]  std hip belt          []  padded hip belt  []  dual pull hip belt  []  four point hip belt  []  stabilize tone  [x]  decrease falling out of chair  []  prevent excessive extension  []  special pull angle to control      rotation  []  pad for protection over boney   prominence  []  promote comfort    []  Essential needs        bag/pouch   []  medicines []  special food rorthotics []  clothing changes  []  diapers  []  catheter/hygiene []  ostomy supplies   The above  equipment has a life- long use expectancy.  Growth and changes in medical and/or functional conditions would be the exceptions.   SUMMARY:  Why mobility device was selected; include why a lower level device is not appropriate:   ASSESSMENT:  CLINICAL IMPRESSION: Patient is a 62 y.o. male who was seen today for physical therapy evaluation and treatment for a  new power wheelchair. Patient has PMH of stage 3 pressure ulcer on sacrum over last approximately 7 years with tendency to crack open per patient, HIV, R AKA, L adrenal mass, and history of DVT. Patient current power wheelchair is in disrepair and > 49 years old with poor seat cushion that is resulting in ongoing skin breakdown. Patient also is a nonfunctional ambulator reporting > 6 falls in the last 6 months. All other AD are inadequate to meet patient's current medical needs given falls risk, upper and lower extremity pain, and endurance deficits. While patient uses walker currently for transfers, given TUG score > 72 seconds and self-reported falls, patient not safe with other form of AD. Given history of skin breakdown and ongoing pressure injury and inability to appropriately offload pressure in scooter or manual wheelchair, a power wheelchair with tilt feature is required in order to decrease patient's risk of future skin breakdown and promote healing. Controls noted above are required to accommodate the tilt feature. The solution skin protection and postioning system will also assist with skin integrity and accommodate positioning needs given patient's history of AKA and tendency away from neutral alignment at baseline. In short, the Quantum J6 is a medical necessity in order to meet patient's current mobility needs to maximize independence and help him safely move throughout his house to perform ADLs.   OBJECTIVE IMPAIRMENTS decreased balance, decreased mobility, difficulty walking, decreased ROM, decreased strength, impaired sensation, prosthetic dependency , and pain.   ACTIVITY LIMITATIONS standing, squatting, stairs, transfers, locomotion level, and caring for others  PARTICIPATION LIMITATIONS: meal prep, cleaning, community activity, and yard work  PERSONAL FACTORS Social background, Time since onset of injury/illness/exacerbation, and 3+ comorbidities: see above  are also affecting patient's  functional outcome.   CLINICAL DECISION MAKING: Evolving/moderate complexity  EVALUATION COMPLEXITY: Moderate                                   GOALS: One time visit. No goals established.  PLAN: PT FREQUENCY: one time visit  Carmelia Bake, PT, DPT 05/29/2023, 12:29 PM    I concur with the above findings and recommendations of the therapist:  Physician name printed:         Physician's signature:      Date:

## 2023-05-30 ENCOUNTER — Other Ambulatory Visit (HOSPITAL_COMMUNITY): Payer: Self-pay

## 2023-05-31 NOTE — Addendum Note (Signed)
Addended by: Maryruth Eve A on: 05/31/2023 05:25 PM   Modules accepted: Orders

## 2023-06-04 ENCOUNTER — Ambulatory Visit: Payer: Medicare Other

## 2023-06-04 ENCOUNTER — Ambulatory Visit (INDEPENDENT_AMBULATORY_CARE_PROVIDER_SITE_OTHER): Payer: Medicare Other | Admitting: Pharmacist

## 2023-06-04 ENCOUNTER — Other Ambulatory Visit (HOSPITAL_COMMUNITY): Payer: Self-pay

## 2023-06-04 DIAGNOSIS — Z86718 Personal history of other venous thrombosis and embolism: Secondary | ICD-10-CM

## 2023-06-04 DIAGNOSIS — Z7901 Long term (current) use of anticoagulants: Secondary | ICD-10-CM

## 2023-06-04 LAB — POCT INR: INR: 2.9 (ref 2.0–3.0)

## 2023-06-04 MED ORDER — WARFARIN SODIUM 5 MG PO TABS
ORAL_TABLET | ORAL | 0 refills | Status: DC
  Filled 2023-06-04: qty 108, 90d supply, fill #0

## 2023-06-04 NOTE — Patient Instructions (Signed)
Patient instructed to take medications as defined in the Anti-coagulation Track section of this encounter.  Patient instructed to take today's dose.  Patient instructed to take 1 & 1/2 tablets on Mondays, Wednesdays and Fridays. All other days (Sundays, Tuesdays, Thursdays and Saturdays) take only one (1) tablet on these days.  Patient verbalized understanding of these instructions.

## 2023-06-04 NOTE — Progress Notes (Signed)
Anticoagulation Management [name] is a 62 y.o. male who reports to the clinic for monitoring of warfarin treatment.    Indication: DVT with history of recurrences, long term current use of oral anticoagulant, warfarin--with target INR range 2.0 - 3.0.  Duration: indefinite Supervising physician: Debe Coder  Anticoagulation Clinic Visit History: Patient does not report signs/symptoms of bleeding or thromboembolism  Other recent changes: No diet, medications, lifestyle changes. Anticoagulation Episode Summary     Current INR goal:  2.0-3.0  TTR:  78.9% (12.4 y)  Next INR check:  07/30/2023  INR from last check:  2.9 (06/04/2023)  Weekly max warfarin dose:  --  Target end date:  Indefinite  INR check location:  Anticoagulation Clinic  Preferred lab:  --  Send INR reminders to:  [facility]   Indications   History of DVT (deep vein thrombosis) [Z86.718] Long term current use of anticoagulant [Z79.01]        Comments:  --        Anticoagulation Care Providers     Provider Role Specialty Phone number   Cliffton Asters, MD  Infectious Diseases [phone]       Allergies  Allergen Reactions   Dapsone     REACTION: fever   Megestrol    Sulfa Antibiotics Other (See Comments)    "put's me down bad"   Sulfamethoxazole-Trimethoprim     REACTION: fever    Current Outpatient Medications:    ASPIRIN 81 PO, Take 81 mg by mouth daily., Disp: , Rfl:    atorvastatin (LIPITOR) 40 MG tablet, Take 1 tablet (40 mg total) by mouth daily., Disp: 90 tablet, Rfl: 3   bictegravir-emtricitabine-tenofovir AF (BIKTARVY) 50-200-25 MG TABS tablet, Take 1 tablet by mouth daily., Disp: 30 tablet, Rfl: 11   darunavir-cobicistat ([name]) 800-150 MG tablet, TAKE 1 TABLET BY MOUTH EVERY DAY DO NOT CRUSH BREAK OR CHEW TABLETS TAKE WITH FOOD, Disp: 30 tablet, Rfl: 11   Ensure (ENSURE), Take 1 Can by mouth 4 (four) times daily - after meals and at bedtime., Disp: 237 mL, Rfl: 11   gabapentin  (NEURONTIN) 400 MG capsule, Take 1 capsule (400 mg total) by mouth 4 (four) times daily., Disp: 360 capsule, Rfl: 3   morphine (MS CONTIN) 30 MG 12 hr tablet, Take 3 tablets (90 mg total) by mouth 3 (three) times daily., Disp: 270 tablet, Rfl: 0   oxyCODONE (OXY IR/ROXICODONE) 5 MG immediate release tablet, Take 1 tablet (5 mg total) by mouth 2 (two) times daily as needed for severe pain., Disp: 60 tablet, Rfl: 0   pantoprazole (PROTONIX) 40 MG tablet, TAKE 1 TABLET(40 MG) BY MOUTH DAILY, Disp: 90 tablet, Rfl: 0   warfarin (COUMADIN) 5 MG tablet, TAKE 1 AND 1/2 TABLETS BY MOUTH ON MONDAYS,  WEDNESDAYS, AND FRIDAYS.  ALL OTHER DAYS, TAKE ONLY 1 TABLET., Disp: 108 tablet, Rfl: 0 Past Medical History:  Diagnosis Date   Chronic kidney disease    History of chicken pox    History of DVT of lower extremity    right   HIV (human immunodeficiency virus infection) (HCC)    Hyperlipidemia    Left hip pain 03/09/2013   Pneumonia    Protein malnutrition (HCC) 08/2015   Social History   Socioeconomic History   Marital status: Widowed    Spouse name: Not on file   Number of children: 3   Years of education: Not on file   Highest education level: Not on file  Occupational History  Employer: UNEMPLOYED  Tobacco Use   Smoking status: Every Day    Current packs/day: 1.00    Average packs/day: 1 pack/day for 36.0 years (36.0 ttl pk-yrs)    Types: Cigarettes   Smokeless tobacco: Never   Tobacco comments:    1 pk a day   Vaping Use   Vaping status: Never Used  Substance and Sexual Activity   Alcohol use: No    Alcohol/week: 0.0 standard drinks of alcohol   Drug use: Not Currently    Frequency: 2.0 times per week    Types: Marijuana   Sexual activity: Not Currently    Comment: denied condoms; states abstinent since wife passed a long time ago  Other Topics Concern   Not on file  Social History Narrative   Not on file   Social Determinants of Health   Financial Resource Strain: Medium  Risk (04/09/2023)   Overall Financial Resource Strain (CARDIA)    Difficulty of Paying Living Expenses: Somewhat hard  Food Insecurity: No Food Insecurity (04/09/2023)   Hunger Vital Sign    Worried About Running Out of Food in the Last Year: Never true    Ran Out of Food in the Last Year: Never true  Transportation Needs: No Transportation Needs (04/09/2023)   PRAPARE - Administrator, Civil Service (Medical): No    Lack of Transportation (Non-Medical): No  Physical Activity: Inactive (04/09/2023)   Exercise Vital Sign    Days of Exercise per Week: 0 days    Minutes of Exercise per Session: 0 min  Stress: No Stress Concern Present (04/09/2023)   [employer] of Occupational Health - Occupational Stress Questionnaire    Feeling of Stress : Only a little  Social Connections: Socially Isolated (04/09/2023)   Social Connection and Isolation Panel [NHANES]    Frequency of Communication with Friends and Family: Never    Frequency of Social Gatherings with Friends and Family: More than three times a week    Attends Religious Services: Never    Database administrator or Organizations: No    Attends Banker Meetings: Never    Marital Status: Widowed   Family History  Problem Relation Age of Onset   Arthritis Mother    Kidney disease Maternal Grandfather     ASSESSMENT Recent Results: The most recent result is correlated with 45 mg per week: Lab Results  Component Value Date   INR 2.9 06/04/2023   INR 2.8 04/09/2023   INR 2.1 02/12/2023   PROTIME 38.4 (H) 04/01/2012    Anticoagulation Dosing: Description   Take 1 & 1/2 tablets of your peach-colored 5mg  strength warfarin tablets on MONDAYS, WEDNESDAYS and FRIDAYS. All other days, Sundays, Tuesdays, Thursdays, and Saturdays, take ONLY ONE (1) tablet.        INR today: Therapeutic  PLAN Weekly dose was decreased by 6% to 42.5 mg per week  Patient Instructions  Patient instructed to take medications as  defined in the Anti-coagulation Track section of this encounter.  Patient instructed to take today's dose.  Patient instructed to take 1 & 1/2 tablets on Mondays, Wednesdays and Fridays. All other days (Sundays, Tuesdays, Thursdays and Saturdays) take only one (1) tablet on these days.  Patient verbalized understanding of these instructions.  Patient advised to contact clinic or seek medical attention if signs/symptoms of bleeding or thromboembolism occur.  Patient verbalized understanding by repeating back information and was advised to contact me if further medication-related questions arise. Patient was also  provided an information handout.  Follow-up Return in 8 weeks (on 07/30/2023) for Follow up INR.  Elicia Lamp, PharmD, CPP  15 minutes spent face-to-face with the patient during the encounter. 50% of time spent on education, including signs/sx bleeding and clotting, as well as food and drug interactions with warfarin. 50% of time was spent on fingerprick POC INR sample collection,processing, results determination, and documentation in [email or web address].

## 2023-06-05 ENCOUNTER — Other Ambulatory Visit: Payer: Self-pay

## 2023-06-25 ENCOUNTER — Other Ambulatory Visit (HOSPITAL_COMMUNITY): Payer: Self-pay

## 2023-06-25 ENCOUNTER — Other Ambulatory Visit: Payer: Self-pay

## 2023-06-26 ENCOUNTER — Other Ambulatory Visit: Payer: Self-pay

## 2023-06-26 DIAGNOSIS — G894 Chronic pain syndrome: Secondary | ICD-10-CM

## 2023-06-27 NOTE — Telephone Encounter (Signed)
Last rx written - 02/13/23. Last OV - 6/10 with Dr Peterson Lombard. Next OV - 9/30 with Dr Geraldo Pitter. TOX - 12/17/15.

## 2023-07-02 ENCOUNTER — Other Ambulatory Visit (HOSPITAL_COMMUNITY): Payer: Self-pay

## 2023-07-02 MED ORDER — OXYCODONE HCL 5 MG PO TABS
5.0000 mg | ORAL_TABLET | Freq: Two times a day (BID) | ORAL | 0 refills | Status: DC | PRN
Start: 2023-07-02 — End: 2023-07-26
  Filled 2023-07-02: qty 60, 30d supply, fill #0

## 2023-07-03 ENCOUNTER — Other Ambulatory Visit (HOSPITAL_COMMUNITY): Payer: Self-pay

## 2023-07-03 ENCOUNTER — Other Ambulatory Visit: Payer: Self-pay

## 2023-07-04 ENCOUNTER — Other Ambulatory Visit (HOSPITAL_COMMUNITY): Payer: Self-pay

## 2023-07-09 ENCOUNTER — Other Ambulatory Visit: Payer: Self-pay

## 2023-07-26 ENCOUNTER — Ambulatory Visit: Payer: Medicare Other

## 2023-07-26 VITALS — Wt 116.0 lb

## 2023-07-26 DIAGNOSIS — Z0000 Encounter for general adult medical examination without abnormal findings: Secondary | ICD-10-CM | POA: Diagnosis not present

## 2023-07-26 NOTE — Progress Notes (Signed)
Subjective:   Kastle Mcclaran is a 62 y.o. male who presents for an Initial Medicare Annual Wellness Visit.  Visit Complete: Virtual  I connected with  Geordon Solin on 07/26/23 by a audio enabled telemedicine application and verified that I am speaking with the correct person using two identifiers.  Patient Location: Home  Provider Location: Office/Clinic  I discussed the limitations of evaluation and management by telemedicine. The patient expressed understanding and agreed to proceed.   Because this visit was a virtual/telehealth visit, some criteria may be missing or patient reported. Any vitals not documented were not able to be obtained and vitals that have been documented are patient reported.    Cardiac Risk Factors include: advanced age (>54men, >68 women);male gender     Objective:    Today's Vitals   07/26/23 0814  Weight: 116 lb (52.6 kg)   Body mass index is 15.73 kg/m.     07/26/2023    8:19 AM 04/09/2023   10:11 AM 01/15/2023   10:56 AM 12/23/2021   12:13 PM 09/22/2019    8:49 AM 09/18/2019    9:04 AM 07/22/2019    2:27 PM  Advanced Directives  Does Patient Have a Medical Advance Directive? No No No No No  No  Would patient like information on creating a medical advance directive? No - Patient declined No - Patient declined No - Patient declined No - Patient declined No - Patient declined No - Patient declined No - Patient declined    Current Medications (verified) Outpatient Encounter Medications as of 07/26/2023  Medication Sig   ASPIRIN 81 PO Take 81 mg by mouth daily.   atorvastatin (LIPITOR) 40 MG tablet Take 1 tablet (40 mg total) by mouth daily.   bictegravir-emtricitabine-tenofovir AF (BIKTARVY) 50-200-25 MG TABS tablet Take 1 tablet by mouth daily.   darunavir-cobicistat ([name]) 800-150 MG tablet TAKE 1 TABLET BY MOUTH EVERY DAY DO NOT CRUSH BREAK OR CHEW TABLETS TAKE WITH FOOD   Ensure (ENSURE) Take 1 Can by mouth 4 (four) times daily - after  meals and at bedtime.   gabapentin (NEURONTIN) 400 MG capsule Take 1 capsule (400 mg total) by mouth 4 (four) times daily.   pantoprazole (PROTONIX) 40 MG tablet TAKE 1 TABLET(40 MG) BY MOUTH DAILY   warfarin (COUMADIN) 5 MG tablet TAKE 1 AND 1/2 TABLETS BY MOUTH ON MONDAYS,  WEDNESDAYS, AND FRIDAYS.  ALL OTHER DAYS, TAKE ONLY 1 TABLET.   [DISCONTINUED] morphine (MS CONTIN) 30 MG 12 hr tablet Take 3 tablets (90 mg total) by mouth 3 (three) times daily.   [DISCONTINUED] oxyCODONE (OXY IR/ROXICODONE) 5 MG immediate release tablet Take 1 tablet (5 mg total) by mouth 2 (two) times daily as needed for severe pain.   No facility-administered encounter medications on file as of 07/26/2023.    Allergies (verified) Dapsone, Megestrol, Sulfa antibiotics, and Sulfamethoxazole-trimethoprim   History: Past Medical History:  Diagnosis Date   Chronic kidney disease    History of chicken pox    History of DVT of lower extremity    right   HIV (human immunodeficiency virus infection) (HCC)    Hyperlipidemia    Left hip pain 03/09/2013   Pneumonia    Protein malnutrition (HCC) 08/2015   Past Surgical History:  Procedure Laterality Date   APPENDECTOMY  1962   LEG AMPUTATION ABOVE KNEE  2010   right leg   Family History  Problem Relation Age of Onset   Arthritis Mother    Kidney disease Maternal  Grandfather    Social History   Socioeconomic History   Marital status: Widowed    Spouse name: Not on file   Number of children: 3   Years of education: Not on file   Highest education level: Not on file  Occupational History    Employer: UNEMPLOYED  Tobacco Use   Smoking status: Every Day    Current packs/day: 1.00    Average packs/day: 1 pack/day for 36.0 years (36.0 ttl pk-yrs)    Types: Cigarettes   Smokeless tobacco: Never   Tobacco comments:    1 pk a day   Vaping Use   Vaping status: Never Used  Substance and Sexual Activity   Alcohol use: No    Alcohol/week: 0.0 standard drinks  of alcohol   Drug use: Not Currently    Frequency: 2.0 times per week    Types: Marijuana   Sexual activity: Not Currently    Comment: denied condoms; states abstinent since wife passed a long time ago  Other Topics Concern   Not on file  Social History Narrative   Not on file   Social Determinants of Health   Financial Resource Strain: Low Risk  (07/26/2023)   Overall Financial Resource Strain (CARDIA)    Difficulty of Paying Living Expenses: Not hard at all  Food Insecurity: No Food Insecurity (07/26/2023)   Hunger Vital Sign    Worried About Running Out of Food in the Last Year: Never true    Ran Out of Food in the Last Year: Never true  Transportation Needs: No Transportation Needs (07/26/2023)   PRAPARE - Administrator, Civil Service (Medical): No    Lack of Transportation (Non-Medical): No  Physical Activity: Inactive (07/26/2023)   Exercise Vital Sign    Days of Exercise per Week: 0 days    Minutes of Exercise per Session: 0 min  Stress: No Stress Concern Present (07/26/2023)   [employer] of Occupational Health - Occupational Stress Questionnaire    Feeling of Stress : Only a little  Social Connections: Socially Isolated (07/26/2023)   Social Connection and Isolation Panel [NHANES]    Frequency of Communication with Friends and Family: More than three times a week    Frequency of Social Gatherings with Friends and Family: Twice a week    Attends Religious Services: Never    Database administrator or Organizations: No    Attends Banker Meetings: Never    Marital Status: Widowed    Tobacco Counseling Ready to quit: Not Answered Counseling given: Not Answered Tobacco comments: 1 pk a day    Clinical Intake:  Pre-visit preparation completed: Yes  Pain : No/denies pain     BMI - recorded: 15.73 Nutritional Status: BMI <19  Underweight Nutritional Risks: None Diabetes: No  How often do you need to have someone help you when you  read instructions, pamphlets, or other written materials from your doctor or pharmacy?: 1 - Never  Interpreter Needed?: No  Information entered by :: Lanier Ensign, LPN   Activities of Daily Living    07/26/2023    8:16 AM 04/09/2023   10:10 AM  In your present state of health, do you have any difficulty performing the following activities:  Hearing? 0 0  Vision? 0 0  Difficulty concentrating or making decisions? 0 1  Walking or climbing stairs? 1 1  Dressing or bathing? 0 0  Doing errands, shopping? 0 0  Preparing Food and eating ? N  Using the Toilet? N   In the past six months, have you accidently leaked urine? N   Do you have problems with loss of bowel control? N   Managing your Medications? N   Managing your Finances? N   Housekeeping or managing your Housekeeping? N     Patient Care Team: Kathleen Lime, MD as PCP - General Cliffton Asters, MD (Inactive) as PCP - Infectious Diseases (Infectious Diseases)  Indicate any recent Medical Services you may have received from other than [facility] providers in the past year (date may be approximate).     Assessment:   This is a routine wellness examination for Kelvyn.  Hearing/Vision screen Hearing Screening - Comments:: Pt denies any hearing issues  Vision Screening - Comments:: Pt encouraged to follow up with provider    Goals Addressed             This Visit's Progress    Patient Stated       Start back walking with can        Depression Screen    07/26/2023    8:19 AM 04/09/2023   10:09 AM 05/10/2022    9:49 AM 12/23/2021   12:13 PM 05/18/2021    9:45 AM 11/09/2020    2:03 PM 06/29/2020    9:31 AM  PHQ 2/9 Scores  PHQ - 2 Score 0 0 0 0 0 0 0    Fall Risk    07/26/2023    8:22 AM 04/09/2023   10:10 AM 05/10/2022    9:48 AM 12/23/2021   12:12 PM 05/18/2021    9:45 AM  Fall Risk   Falls in the past year? 0 0 0 0 0  Number falls in past yr: 0 0  0   Injury with Fall? 0 0  0   Risk for fall due to : No Fall  Risks No Fall Risks Impaired balance/gait;Impaired mobility  Impaired balance/gait;History of fall(s)  Risk for fall due to: Comment   Pt in w/c    Follow up Falls prevention discussed Falls prevention discussed;Falls evaluation completed Falls evaluation completed Falls evaluation completed Falls evaluation completed;Education provided    MEDICARE RISK AT HOME: Medicare Risk at Home Any stairs in or around the home?: Yes If so, are there any without handrails?: No Home free of loose throw rugs in walkways, pet beds, electrical cords, etc?: Yes Adequate lighting in your home to reduce risk of falls?: Yes Life alert?: No Use of a cane, walker or w/c?: Yes Grab bars in the bathroom?: No Shower chair or bench in shower?: Yes Elevated toilet seat or a handicapped toilet?: No  TIMED UP AND GO:  Was the test performed? No    Cognitive Function:        07/26/2023    8:23 AM  6CIT Screen  What Year? 0 points  What month? 0 points  What time? 0 points  Count back from 20 0 points  Months in reverse 0 points  Repeat phrase 0 points  Total Score 0 points    Immunizations Immunization History  Administered Date(s) Administered   H1N1 12/29/2008   Influenza Split 09/26/2011, 09/24/2012   Influenza Whole 09/27/2005, 08/19/2007, 08/23/2009, 09/21/2010   Influenza,inj,Quad PF,6+ Mos 11/04/2013, 09/15/2014, 09/12/2015, 12/12/2016, 12/06/2017, 11/07/2018, 09/18/2019, 11/09/2020, 12/23/2021   PFIZER(Purple Top)SARS-COV-2 Vaccination 06/29/2020, 07/23/2020   Pfizer(Comirnaty)Fall Seasonal Vaccine 12 years and older 11/30/2022   Pneumococcal Polysaccharide-23 09/01/2003, 04/21/2008   Tdap 10/08/2014    TDAP status: Up to date  Flu Vaccine status: Up to date    Covid-19 vaccine status: Information provided on how to obtain vaccines.   Qualifies for Shingles Vaccine? Yes   Zostavax completed No   Shingrix Completed?: No.    Education has been provided regarding the importance of  this vaccine. Patient has been advised to call insurance company to determine out of pocket expense if they have not yet received this vaccine. Advised may also receive vaccine at local pharmacy or Health Dept. Verbalized acceptance and understanding.  Screening Tests Health Maintenance  Topic Date Due   Zoster Vaccines- Shingrix (1 of 2) Never done   Colonoscopy  Never done   COLON CANCER SCREENING ANNUAL FOBT  12/26/2022   INFLUENZA VACCINE  05/31/2023   COVID-19 Vaccine (4 - 2023-24 season) 07/01/2023   DTaP/Tdap/Td (2 - Td or Tdap) 10/08/2024   Hepatitis C Screening  Completed   HIV Screening  Completed   HPV VACCINES  Aged Out    Health Maintenance  Health Maintenance Due  Topic Date Due   Zoster Vaccines- Shingrix (1 of 2) Never done   Colonoscopy  Never done   COLON CANCER SCREENING ANNUAL FOBT  12/26/2022   INFLUENZA VACCINE  05/31/2023   COVID-19 Vaccine (4 - 2023-24 season) 07/01/2023    Colorectal cancer screening: No longer required. Pt declined    Additional Screening:  Hepatitis C Screening:  Completed 06/23/09  Vision Screening: Recommended annual ophthalmology exams for early detection of glaucoma and other disorders of the eye. Is the patient up to date with their annual eye exam?  No  Who is the provider or what is the name of the office in which the patient attends annual eye exams? Encouraged to follow up with exams  If pt is not established with a provider, would they like to be referred to a provider to establish care? No .   Dental Screening: Recommended annual dental exams for proper oral hygiene   Community Resource Referral / Chronic Care Management: CRR required this visit?  No   CCM required this visit?  No    Plan:     I have personally reviewed and noted the following in the patient's chart:   Medical and social history Use of alcohol, tobacco or illicit drugs  Current medications and supplements including opioid prescriptions.  Patient is not currently taking opioid prescriptions. Functional ability and status Nutritional status Physical activity Advanced directives List of other physicians Hospitalizations, surgeries, and ER visits in previous 12 months Vitals Screenings to include cognitive, depression, and falls Referrals and appointments  In addition, I have reviewed and discussed with patient certain preventive protocols, quality metrics, and best practice recommendations. A written personalized care plan for preventive services as well as general preventive health recommendations were provided to patient.     Marzella Schlein, LPN   7/00/1749   After Visit Summary: (Declined) Due to this being a telephonic visit, with patients personalized plan was offered to patient but patient Declined AVS at this time   Nurse Notes: none

## 2023-07-26 NOTE — Patient Instructions (Signed)
[name] , Thank you for taking time to come for your Medicare Wellness Visit. I appreciate your ongoing commitment to your health goals. Please review the following plan we discussed and let me know if I can assist you in the future.   Referrals/Orders/Follow-Ups/Clinician Recommendations: Get back to walking with cane, If you wish to quit smoking, help is available. For free tobacco cessation program offerings call the [facility] at [phone] or Live Well Line at 725-203-6032. You may also visit www.[email or web address] or email [email or web address] .com for more information on other programs.   You may also call 1-800-QUIT-NOW (681-789-8021) or visit www.[place].ch or www.[employer]Ex.org for additional resources on smoking cessation.   Each day, aim for 6 glasses of water, plenty of protein in your diet and try to get up and walk/ stretch every hour for 5-10 minutes at a time.    This is a list of the screening recommended for you and due dates:  Health Maintenance  Topic Date Due   Zoster (Shingles) Vaccine (1 of 2) Never done   Colon Cancer Screening  Never done   Stool Blood Test  12/26/2022   Flu Shot  05/31/2023   COVID-19 Vaccine (4 - 2023-24 season) 07/01/2023   DTaP/Tdap/Td vaccine (2 - Td or Tdap) 10/08/2024   Hepatitis C Screening  Completed   HIV Screening  Completed   HPV Vaccine  Aged Out    Advanced directives: (Declined) Advance directive discussed with you today. Even though you declined this today, please call our office should you change your mind, and we can give you the proper paperwork for you to fill out.  Next Medicare Annual Wellness Visit scheduled for next year: Yes

## 2023-07-30 ENCOUNTER — Other Ambulatory Visit: Payer: Self-pay

## 2023-07-30 ENCOUNTER — Encounter: Payer: Medicare Other | Admitting: Student

## 2023-07-30 ENCOUNTER — Ambulatory Visit: Payer: Medicare Other

## 2023-07-30 NOTE — Progress Notes (Deleted)
62 year old male with HIV, DVT on warfarin, and avascular necrosis of the left femoral head 04/09/2023  Low-density nodule in the left adrenal gland on CT in 2010, does not been for follow-up CT   Fecal occult blood + 11/2021, declined colonoscopy at prior visit 12/2022

## 2023-07-30 NOTE — Progress Notes (Signed)
Specialty Pharmacy Refill Coordination Note  [name] is a 62 y.o. male contacted today regarding refills of specialty medication(s) Bictegravir-Emtricitab-Tenofov; Darunavir-Cobicistat .  Patient requested Delivery  on 08/03/23  to verified address [address] [place] [address]   Medication will be filled on 08/02/23.

## 2023-08-02 ENCOUNTER — Other Ambulatory Visit: Payer: Self-pay

## 2023-08-20 ENCOUNTER — Other Ambulatory Visit: Payer: Self-pay

## 2023-08-20 ENCOUNTER — Ambulatory Visit: Payer: Medicare Other | Admitting: Pharmacist

## 2023-08-20 ENCOUNTER — Encounter: Payer: Medicare Other | Admitting: Student

## 2023-08-20 ENCOUNTER — Telehealth: Payer: Self-pay | Admitting: *Deleted

## 2023-08-20 DIAGNOSIS — Z5181 Encounter for therapeutic drug level monitoring: Secondary | ICD-10-CM | POA: Diagnosis not present

## 2023-08-20 DIAGNOSIS — Z7901 Long term (current) use of anticoagulants: Secondary | ICD-10-CM

## 2023-08-20 DIAGNOSIS — Z86718 Personal history of other venous thrombosis and embolism: Secondary | ICD-10-CM

## 2023-08-20 LAB — POCT INR: INR: 2.2 (ref 2.0–3.0)

## 2023-08-20 MED ORDER — WARFARIN SODIUM 5 MG PO TABS
ORAL_TABLET | ORAL | 2 refills | Status: DC
  Filled 2023-08-20: qty 108, 90d supply, fill #0

## 2023-08-20 NOTE — Progress Notes (Signed)
Evaluation and management procedures were performed by the Clinical Pharmacy Practitioner under my supervision and collaboration. I have reviewed the Practitioner's note and chart, and I agree with the management and plan as documented above. ° °

## 2023-08-20 NOTE — Progress Notes (Signed)
Anticoagulation Management [name] is a 62 y.o. male who reports to the clinic for monitoring of warfarin treatment.    Indication: DVT , history of; Long term current use of oral anticoagulant,k warfarin with target INR range 2.0 - 3.0.  Duration: indefinite Supervising physician:  Mercie Eon, MD  Anticoagulation Clinic Visit History: Patient does not report signs/symptoms of bleeding or thromboembolism  Other recent changes: No diet, medications, lifestyle changes.  Anticoagulation Episode Summary     Current INR goal:  2.0-3.0  TTR:  79.3% (12.6 y)  Next INR check:  10/15/2023  INR from last check:  2.2 (08/20/2023)  Weekly max warfarin dose:  --  Target end date:  Indefinite  INR check location:  Anticoagulation Clinic  Preferred lab:  --  Send INR reminders to:  [facility]   Indications   History of DVT (deep vein thrombosis) [Z86.718] Long term current use of anticoagulant [Z79.01]        Comments:  --        Anticoagulation Care Providers     Provider Role Specialty Phone number   Cliffton Asters, MD  Infectious Diseases [phone]       Allergies  Allergen Reactions   Dapsone     REACTION: fever   Megestrol    Sulfa Antibiotics Other (See Comments)    "put's me down bad"   Sulfamethoxazole-Trimethoprim     REACTION: fever    Current Outpatient Medications:    ASPIRIN 81 PO, Take 81 mg by mouth daily., Disp: , Rfl:    atorvastatin (LIPITOR) 40 MG tablet, Take 1 tablet (40 mg total) by mouth daily., Disp: 90 tablet, Rfl: 3   bictegravir-emtricitabine-tenofovir AF (BIKTARVY) 50-200-25 MG TABS tablet, Take 1 tablet by mouth daily., Disp: 30 tablet, Rfl: 11   darunavir-cobicistat ([name]) 800-150 MG tablet, TAKE 1 TABLET BY MOUTH EVERY DAY DO NOT CRUSH BREAK OR CHEW TABLETS TAKE WITH FOOD, Disp: 30 tablet, Rfl: 11   Ensure (ENSURE), Take 1 Can by mouth 4 (four) times daily - after meals and at bedtime., Disp: 237 mL, Rfl: 11   gabapentin  (NEURONTIN) 400 MG capsule, Take 1 capsule (400 mg total) by mouth 4 (four) times daily., Disp: 360 capsule, Rfl: 3   pantoprazole (PROTONIX) 40 MG tablet, TAKE 1 TABLET(40 MG) BY MOUTH DAILY, Disp: 90 tablet, Rfl: 0   warfarin (COUMADIN) 5 MG tablet, TAKE 1 AND 1/2 TABLETS BY MOUTH ON MONDAYS,  WEDNESDAYS, AND FRIDAYS.  ALL OTHER DAYS, TAKE ONLY 1 TABLET., Disp: 108 tablet, Rfl: 2 Past Medical History:  Diagnosis Date   Chronic kidney disease    History of chicken pox    History of DVT of lower extremity    right   HIV (human immunodeficiency virus infection) (HCC)    Hyperlipidemia    Left hip pain 03/09/2013   Pneumonia    Protein malnutrition (HCC) 08/2015   Social History   Socioeconomic History   Marital status: Widowed    Spouse name: Not on file   Number of children: 3   Years of education: Not on file   Highest education level: Not on file  Occupational History    Employer: UNEMPLOYED  Tobacco Use   Smoking status: Every Day    Current packs/day: 1.00    Average packs/day: 1 pack/day for 36.0 years (36.0 ttl pk-yrs)    Types: Cigarettes   Smokeless tobacco: Never   Tobacco comments:    1 pk a day   Vaping Use  Vaping status: Never Used  Substance and Sexual Activity   Alcohol use: No    Alcohol/week: 0.0 standard drinks of alcohol   Drug use: Not Currently    Frequency: 2.0 times per week    Types: Marijuana   Sexual activity: Not Currently    Comment: denied condoms; states abstinent since wife passed a long time ago  Other Topics Concern   Not on file  Social History Narrative   Not on file   Social Determinants of Health   Financial Resource Strain: Low Risk  (07/26/2023)   Overall Financial Resource Strain (CARDIA)    Difficulty of Paying Living Expenses: Not hard at all  Food Insecurity: No Food Insecurity (07/26/2023)   Hunger Vital Sign    Worried About Running Out of Food in the Last Year: Never true    Ran Out of Food in the Last Year: Never  true  Transportation Needs: No Transportation Needs (07/26/2023)   PRAPARE - Administrator, Civil Service (Medical): No    Lack of Transportation (Non-Medical): No  Physical Activity: Inactive (07/26/2023)   Exercise Vital Sign    Days of Exercise per Week: 0 days    Minutes of Exercise per Session: 0 min  Stress: No Stress Concern Present (07/26/2023)   [employer] of Occupational Health - Occupational Stress Questionnaire    Feeling of Stress : Only a little  Social Connections: Socially Isolated (07/26/2023)   Social Connection and Isolation Panel [NHANES]    Frequency of Communication with Friends and Family: More than three times a week    Frequency of Social Gatherings with Friends and Family: Twice a week    Attends Religious Services: Never    Database administrator or Organizations: No    Attends Banker Meetings: Never    Marital Status: Widowed   Family History  Problem Relation Age of Onset   Arthritis Mother    Kidney disease Maternal Grandfather     ASSESSMENT Recent Results: The most recent result is correlated with 42.5 mg per week: Lab Results  Component Value Date   INR 2.2 08/20/2023   INR 2.9 06/04/2023   INR 2.8 04/09/2023   PROTIME 38.4 (H) 04/01/2012    Anticoagulation Dosing: Description   Take 1 & 1/2 tablets of your peach-colored 5mg  strength warfarin tablets on MONDAYS, WEDNESDAYS and FRIDAYS. All other days, Sundays, Tuesdays, Thursdays, and Saturdays, take ONLY ONE (1) tablet.        INR today: Therapeutic  PLAN Weekly dose was unchanged.  Patient Instructions  Patient instructed to take medications as defined in the Anti-coagulation Track section of this encounter.  Patient instructed to take today's dose.  Patient instructed to take 1 & 1/2 tablets of your peach-colored 5mg  strength warfarin tablets on MONDAYS, WEDNESDAYS and FRIDAYS. All other days, Sundays, Tuesdays, Thursdays, and Saturdays, take ONLY ONE  (1) tablet.    Patient verbalized understanding of these instructions.  Patient advised to contact clinic or seek medical attention if signs/symptoms of bleeding or thromboembolism occur.  Patient verbalized understanding by repeating back information and was advised to contact me if further medication-related questions arise. Patient was also provided an information handout.  Follow-up Return in about 8 weeks (around 10/15/2023).  Elicia Lamp, PharmD, CPP  15 minutes spent face-to-face with the patient during the encounter. 50% of time spent on education, including signs/sx bleeding and clotting, as well as food and drug interactions with warfarin. 50% of time  was spent on fingerprick POC INR sample collection,processing, results determination, and documentation in [email or web address].

## 2023-08-20 NOTE — Patient Instructions (Signed)
Patient instructed to take medications as defined in the Anti-coagulation Track section of this encounter.  Patient instructed to take today's dose.  Patient instructed to take 1 & 1/2 tablets of your peach-colored 5mg  strength warfarin tablets on MONDAYS, WEDNESDAYS and FRIDAYS. All other days, Sundays, Tuesdays, Thursdays, and Saturdays, take ONLY ONE (1) tablet.    Patient verbalized understanding of these instructions.

## 2023-08-20 NOTE — Telephone Encounter (Signed)
Pt is here to see Dr Alexandria Lodge, coumadin clinic. He was late to see the doctor; appt re-scheduled with Dr Carlynn Purl on 11/5. He's requesting a refill on his pain medication - not on current med list - Oxycodone. Last appt was 6/10 with Dr Peterson Lombard.

## 2023-08-21 ENCOUNTER — Other Ambulatory Visit (HOSPITAL_COMMUNITY): Payer: Self-pay

## 2023-08-23 ENCOUNTER — Other Ambulatory Visit: Payer: Self-pay

## 2023-08-23 ENCOUNTER — Other Ambulatory Visit (HOSPITAL_COMMUNITY): Payer: Self-pay

## 2023-08-23 MED ORDER — OXYCODONE HCL 5 MG PO TABS
5.0000 mg | ORAL_TABLET | Freq: Two times a day (BID) | ORAL | 0 refills | Status: DC
  Filled 2023-08-23: qty 26, 13d supply, fill #0

## 2023-08-28 ENCOUNTER — Other Ambulatory Visit (HOSPITAL_COMMUNITY): Payer: Self-pay

## 2023-08-28 ENCOUNTER — Other Ambulatory Visit (HOSPITAL_COMMUNITY): Payer: Self-pay | Admitting: Pharmacy Technician

## 2023-08-28 NOTE — Progress Notes (Signed)
Specialty Pharmacy Refill Coordination Note  Rakeen Ginger is a 63 y.o. male contacted today regarding refills of specialty medication(s) Bictegravir-Emtricitab-Tenofov; Darunavir-Cobicistat   Patient requested Delivery   Delivery date: 09/04/23   Verified address: [address] [place]   Medication will be filled on 09/03/23.

## 2023-09-04 ENCOUNTER — Other Ambulatory Visit: Payer: Self-pay

## 2023-09-04 ENCOUNTER — Encounter: Payer: Self-pay | Admitting: Internal Medicine

## 2023-09-04 ENCOUNTER — Ambulatory Visit (INDEPENDENT_AMBULATORY_CARE_PROVIDER_SITE_OTHER): Payer: Medicare Other | Admitting: Internal Medicine

## 2023-09-04 ENCOUNTER — Other Ambulatory Visit (HOSPITAL_COMMUNITY): Payer: Self-pay

## 2023-09-04 VITALS — BP 113/70 | HR 91 | Temp 98.2°F | Ht 72.0 in | Wt 115.6 lb

## 2023-09-04 DIAGNOSIS — Z89611 Acquired absence of right leg above knee: Secondary | ICD-10-CM

## 2023-09-04 DIAGNOSIS — M6284 Sarcopenia: Secondary | ICD-10-CM | POA: Diagnosis not present

## 2023-09-04 MED ORDER — OXYCODONE HCL 5 MG PO TABS
5.0000 mg | ORAL_TABLET | Freq: Two times a day (BID) | ORAL | 0 refills | Status: DC
  Filled 2023-09-04: qty 26, 13d supply, fill #0

## 2023-09-04 NOTE — Assessment & Plan Note (Signed)
Patient was seen in clinic today for evaluation of his need for motorized wheelchair. Patient was recently evaluated by neuro rehab therapy, with documentation describing his specific debility and need for motorized wheelchair. Specifically, patient's above the knee amputation as well as sarcopenia of upper and lower extremities make his mobility and ability to pilot a traditional wheelchair extremely difficult. I reviewed the therapist's evaluation and agree with this patient's need for a motorized wheelchair.

## 2023-09-04 NOTE — Progress Notes (Signed)
    Subjective:  CC: Evaluation for power wheelchair  HPI:  [name] is a 62 y.o. male with a past medical history stated below and presents today for above. Please see problem based assessment and plan for additional details.  Past Medical History:  Diagnosis Date   Chronic kidney disease    History of chicken pox    History of DVT of lower extremity    right   HIV (human immunodeficiency virus infection) (HCC)    Hyperlipidemia    Left hip pain 03/09/2013   Pneumonia    Protein malnutrition (HCC) 08/2015    Current Outpatient Medications on File Prior to Visit  Medication Sig Dispense Refill   ASPIRIN 81 PO Take 81 mg by mouth daily.     atorvastatin (LIPITOR) 40 MG tablet Take 1 tablet (40 mg total) by mouth daily. 90 tablet 3   bictegravir-emtricitabine-tenofovir AF (BIKTARVY) 50-200-25 MG TABS tablet Take 1 tablet by mouth daily. 30 tablet 11   darunavir-cobicistat ([name]) 800-150 MG tablet TAKE 1 TABLET BY MOUTH EVERY DAY DO NOT CRUSH BREAK OR CHEW TABLETS TAKE WITH FOOD 30 tablet 11   Ensure (ENSURE) Take 1 Can by mouth 4 (four) times daily - after meals and at bedtime. 237 mL 11   gabapentin (NEURONTIN) 400 MG capsule Take 1 capsule (400 mg total) by mouth 4 (four) times daily. 360 capsule 3   pantoprazole (PROTONIX) 40 MG tablet TAKE 1 TABLET(40 MG) BY MOUTH DAILY 90 tablet 0   warfarin (COUMADIN) 5 MG tablet TAKE 1 AND 1/2 TABLETS BY MOUTH ON MONDAYS,  WEDNESDAYS, AND FRIDAYS.  ALL OTHER DAYS, TAKE ONLY 1 TABLET. 108 tablet 2   No current facility-administered medications on file prior to visit.    Review of Systems: ROS negative except for as is noted on the assessment and plan.  Objective:   Vitals:   09/04/23 0855 09/04/23 0905  BP: (!) 111/50 113/70  Pulse: 92 91  Temp: 98.2 F (36.8 C)   TempSrc: Oral   SpO2: 98%   Weight: 115 lb 9.6 oz (52.4 kg)   Height: 6' (1.829 m)     Physical Exam: Constitutional: well-appearing, in no acute  distress HENT: normocephalic atraumatic, mucous membranes moist Eyes: conjunctiva non-erythematous Neck: supple Cardiovascular: regular rate and rhythm, no m/r/g Pulmonary/Chest: normal work of breathing on room air, lungs clear to auscultation bilaterally Abdominal: soft, non-tender, non-distended MSK: sarcopenic, poor upper and lower muscle tone Neurological: alert & oriented x 3, 5/5 strength in bilateral upper and lower extremities, normal gait Skin: warm and dry  Assessment & Plan:   Status post above knee amputation [facility]) Patient was seen in clinic today for evaluation of his need for motorized wheelchair. Patient was recently evaluated by neuro rehab therapy, with documentation describing his specific debility and need for motorized wheelchair. Specifically, patient's above the knee amputation as well as sarcopenia of upper and lower extremities make his mobility and ability to pilot a traditional wheelchair extremely difficult. I reviewed the therapist's evaluation and agree with this patient's need for a motorized wheelchair.   Patient seen with Dr. Cleda [name]  Matthew Fam MD [facility] Internal Medicine  PGY-1 Pager: [phone] Date 09/04/2023  Time 9:55 AM

## 2023-09-04 NOTE — Progress Notes (Signed)
Internal Medicine Clinic Attending  I was physically present during the key portions of the resident provided service and participated in the medical decision making of patient's management care. I reviewed pertinent patient test results.  The assessment, diagnosis, and plan were formulated together and I agree with the documentation in the resident's note.  Gust Rung, DO

## 2023-09-08 ENCOUNTER — Other Ambulatory Visit (HOSPITAL_COMMUNITY): Payer: Self-pay

## 2023-09-18 ENCOUNTER — Other Ambulatory Visit (HOSPITAL_COMMUNITY): Payer: Self-pay

## 2023-09-18 ENCOUNTER — Other Ambulatory Visit: Payer: Self-pay | Admitting: Internal Medicine

## 2023-09-18 MED ORDER — OXYCODONE HCL 5 MG PO TABS
5.0000 mg | ORAL_TABLET | Freq: Two times a day (BID) | ORAL | 0 refills | Status: DC
  Filled 2023-09-18: qty 26, 13d supply, fill #0

## 2023-09-19 ENCOUNTER — Other Ambulatory Visit: Payer: Self-pay

## 2023-09-19 ENCOUNTER — Other Ambulatory Visit (HOSPITAL_COMMUNITY): Payer: Self-pay

## 2023-09-25 ENCOUNTER — Other Ambulatory Visit (HOSPITAL_COMMUNITY): Payer: Self-pay | Admitting: Pharmacy Technician

## 2023-09-25 ENCOUNTER — Other Ambulatory Visit (HOSPITAL_COMMUNITY): Payer: Self-pay

## 2023-09-25 NOTE — Progress Notes (Signed)
Specialty Pharmacy Refill Coordination Note  Craven Shoemake is a 62 y.o. male contacted today regarding refills of specialty medication(s) Bictegravir-Emtricitab-Tenofov; Darunavir-Cobicistat   Patient requested Delivery   Delivery date: 10/02/23   Verified address: [address]   [place]   Medication will be filled on 10/01/23.

## 2023-10-01 ENCOUNTER — Other Ambulatory Visit: Payer: Self-pay

## 2023-10-02 ENCOUNTER — Other Ambulatory Visit (HOSPITAL_COMMUNITY): Payer: Self-pay

## 2023-10-02 ENCOUNTER — Other Ambulatory Visit: Payer: Self-pay | Admitting: Student

## 2023-10-04 ENCOUNTER — Other Ambulatory Visit (HOSPITAL_COMMUNITY): Payer: Self-pay

## 2023-10-04 ENCOUNTER — Other Ambulatory Visit: Payer: Self-pay

## 2023-10-04 MED ORDER — OXYCODONE HCL 5 MG PO TABS
5.0000 mg | ORAL_TABLET | Freq: Two times a day (BID) | ORAL | 0 refills | Status: DC
  Filled 2023-10-04 – 2023-10-05 (×2): qty 26, 13d supply, fill #0

## 2023-10-04 NOTE — Telephone Encounter (Signed)
Please advise refill request

## 2023-10-05 ENCOUNTER — Other Ambulatory Visit (HOSPITAL_COMMUNITY): Payer: Self-pay

## 2023-10-05 ENCOUNTER — Other Ambulatory Visit: Payer: Self-pay

## 2023-10-11 ENCOUNTER — Other Ambulatory Visit: Payer: Self-pay | Admitting: Student

## 2023-10-11 DIAGNOSIS — E278 Other specified disorders of adrenal gland: Secondary | ICD-10-CM

## 2023-10-15 ENCOUNTER — Ambulatory Visit (INDEPENDENT_AMBULATORY_CARE_PROVIDER_SITE_OTHER): Payer: Medicare Other | Admitting: Pharmacist

## 2023-10-15 DIAGNOSIS — Z7901 Long term (current) use of anticoagulants: Secondary | ICD-10-CM | POA: Diagnosis not present

## 2023-10-15 DIAGNOSIS — Z86718 Personal history of other venous thrombosis and embolism: Secondary | ICD-10-CM

## 2023-10-15 LAB — POCT INR: INR: 2.1 (ref 2.0–3.0)

## 2023-10-15 NOTE — Patient Instructions (Signed)
Patient instructed to take medications as defined in the Anti-coagulation Track section of this encounter.  Patient instructed to take today's dose.  Patient instructed to take 1 & 1/2 tablets of your peach-colored 5mg  strength warfarin tablets on MONDAYS, WEDNESDAYS and FRIDAYS. All other days, Sundays, Tuesdays, Thursdays, and Saturdays, take ONLY ONE (1) tablet.    Patient verbalized understanding of these instructions.

## 2023-10-15 NOTE — Progress Notes (Signed)
Anticoagulation Management [name] is a 62 y.o. male who reports to the clinic for monitoring of warfarin treatment.    Indication: DVT, History of; Long term current use of oral anticoagulation with warfarin for target INR range 2.0 - 3.0.  Duration: indefinite Supervising physician:  Mercie Eon, MD  Anticoagulation Clinic Visit History: Patient does not report signs/symptoms of bleeding or thromboembolism  Other recent changes: No diet, medications, lifestyle change identified.  Anticoagulation Episode Summary     Current INR goal:  2.0-3.0  TTR:  79.5% (12.7 y)  Next INR check:  12/10/2023  INR from last check:  2.1 (10/15/2023)  Weekly max warfarin dose:  --  Target end date:  Indefinite  INR check location:  Anticoagulation Clinic  Preferred lab:  --  Send INR reminders to:  [facility]   Indications   History of DVT (deep vein thrombosis) [Z86.718] Long term current use of anticoagulant [Z79.01]        Comments:  --        Anticoagulation Care Providers     Provider Role Specialty Phone number   Cliffton Asters, MD  Infectious Diseases [phone]       Allergies  Allergen Reactions   Dapsone     REACTION: fever   Megestrol    Sulfa Antibiotics Other (See Comments)    "put's me down bad"   Sulfamethoxazole-Trimethoprim     REACTION: fever    Current Outpatient Medications:    ASPIRIN 81 PO, Take 81 mg by mouth daily., Disp: , Rfl:    atorvastatin (LIPITOR) 40 MG tablet, Take 1 tablet (40 mg total) by mouth daily., Disp: 90 tablet, Rfl: 3   bictegravir-emtricitabine-tenofovir AF (BIKTARVY) 50-200-25 MG TABS tablet, Take 1 tablet by mouth daily., Disp: 30 tablet, Rfl: 11   darunavir-cobicistat ([name]) 800-150 MG tablet, TAKE 1 TABLET BY MOUTH EVERY DAY DO NOT CRUSH BREAK OR CHEW TABLETS TAKE WITH FOOD, Disp: 30 tablet, Rfl: 11   Ensure (ENSURE), Take 1 Can by mouth 4 (four) times daily - after meals and at bedtime., Disp: 237 mL, Rfl: 11    gabapentin (NEURONTIN) 400 MG capsule, Take 1 capsule (400 mg total) by mouth 4 (four) times daily., Disp: 360 capsule, Rfl: 3   oxyCODONE (OXY IR/ROXICODONE) 5 MG immediate release tablet, Take 1 tablet (5 mg total) by mouth 2 (two) times daily for 13 days., Disp: 26 tablet, Rfl: 0   pantoprazole (PROTONIX) 40 MG tablet, TAKE 1 TABLET(40 MG) BY MOUTH DAILY, Disp: 90 tablet, Rfl: 0   warfarin (COUMADIN) 5 MG tablet, TAKE 1 AND 1/2 TABLETS BY MOUTH ON MONDAYS,  WEDNESDAYS, AND FRIDAYS.  ALL OTHER DAYS, TAKE ONLY 1 TABLET., Disp: 108 tablet, Rfl: 2 Past Medical History:  Diagnosis Date   Chronic kidney disease    History of chicken pox    History of DVT of lower extremity    right   HIV (human immunodeficiency virus infection) (HCC)    Hyperlipidemia    Left hip pain 03/09/2013   Pneumonia    Protein malnutrition (HCC) 08/2015   Social History   Socioeconomic History   Marital status: Widowed    Spouse name: Not on file   Number of children: 3   Years of education: Not on file   Highest education level: Not on file  Occupational History    Employer: UNEMPLOYED  Tobacco Use   Smoking status: Every Day    Current packs/day: 1.00    Average packs/day: 1 pack/day  for 36.0 years (36.0 ttl pk-yrs)    Types: Cigarettes   Smokeless tobacco: Never   Tobacco comments:    1 pk a day   Vaping Use   Vaping status: Never Used  Substance and Sexual Activity   Alcohol use: No    Alcohol/week: 0.0 standard drinks of alcohol   Drug use: Not Currently    Frequency: 2.0 times per week    Types: Marijuana   Sexual activity: Not Currently    Comment: denied condoms; states abstinent since wife passed a long time ago  Other Topics Concern   Not on file  Social History Narrative   Not on file   Social Drivers of Health   Financial Resource Strain: Low Risk  (07/26/2023)   Overall Financial Resource Strain (CARDIA)    Difficulty of Paying Living Expenses: Not hard at all  Food Insecurity: No  Food Insecurity (07/26/2023)   Hunger Vital Sign    Worried About Running Out of Food in the Last Year: Never true    Ran Out of Food in the Last Year: Never true  Transportation Needs: No Transportation Needs (07/26/2023)   PRAPARE - Administrator, Civil Service (Medical): No    Lack of Transportation (Non-Medical): No  Physical Activity: Inactive (07/26/2023)   Exercise Vital Sign    Days of Exercise per Week: 0 days    Minutes of Exercise per Session: 0 min  Stress: No Stress Concern Present (07/26/2023)   [employer] of Occupational Health - Occupational Stress Questionnaire    Feeling of Stress : Only a little  Social Connections: Socially Isolated (07/26/2023)   Social Connection and Isolation Panel [NHANES]    Frequency of Communication with Friends and Family: More than three times a week    Frequency of Social Gatherings with Friends and Family: Twice a week    Attends Religious Services: Never    Database administrator or Organizations: No    Attends Banker Meetings: Never    Marital Status: Widowed   Family History  Problem Relation Age of Onset   Arthritis Mother    Kidney disease Maternal Grandfather     ASSESSMENT Recent Results: The most recent result is correlated with 42.5 mg per week: Lab Results  Component Value Date   INR 2.1 10/15/2023   INR 2.2 08/20/2023   INR 2.9 06/04/2023   PROTIME 38.4 (H) 04/01/2012    Anticoagulation Dosing: Description   Take 1 & 1/2 tablets of your peach-colored 5mg  strength warfarin tablets on MONDAYS, WEDNESDAYS and FRIDAYS. All other days, Sundays, Tuesdays, Thursdays, and Saturdays, take ONLY ONE (1) tablet.        INR today: Therapeutic  PLAN Weekly dose was unchanged.   Patient Instructions  Patient instructed to take medications as defined in the Anti-coagulation Track section of this encounter.  Patient instructed to take today's dose.  Patient instructed to take 1 & 1/2 tablets of  your peach-colored 5mg  strength warfarin tablets on MONDAYS, WEDNESDAYS and FRIDAYS. All other days, Sundays, Tuesdays, Thursdays, and Saturdays, take ONLY ONE (1) tablet.    Patient verbalized understanding of these instructions.  Patient advised to contact clinic or seek medical attention if signs/symptoms of bleeding or thromboembolism occur.  Patient verbalized understanding by repeating back information and was advised to contact me if further medication-related questions arise. Patient was also provided an information handout.  Follow-up Return in 8 weeks (on 12/10/2023) for Follow up INR.  [name]  Makenna Macaluso, PharmD, CPP  15 minutes spent face-to-face with the patient during the encounter. 50% of time spent on education, including signs/sx bleeding and clotting, as well as food and drug interactions with warfarin. 50% of time was spent on fingerprick POC INR sample collection,processing, results determination, and documentation in [email or web address].

## 2023-10-15 NOTE — Progress Notes (Signed)
Evaluation and management procedures were performed by the Clinical Pharmacy Practitioner under my supervision and collaboration. I have reviewed the Practitioner's note and chart, and I agree with the management and plan as documented above. ° °

## 2023-10-17 ENCOUNTER — Other Ambulatory Visit (HOSPITAL_COMMUNITY): Payer: Self-pay

## 2023-10-17 ENCOUNTER — Other Ambulatory Visit: Payer: Self-pay | Admitting: Internal Medicine

## 2023-10-17 MED ORDER — OXYCODONE HCL 5 MG PO TABS
5.0000 mg | ORAL_TABLET | Freq: Two times a day (BID) | ORAL | 0 refills | Status: DC
  Filled 2023-10-17: qty 26, 13d supply, fill #0

## 2023-10-17 NOTE — Telephone Encounter (Signed)
Last rx written - 10/04/23. Last OV - 09/04/23.

## 2023-10-18 ENCOUNTER — Other Ambulatory Visit: Payer: Self-pay

## 2023-10-18 ENCOUNTER — Other Ambulatory Visit (HOSPITAL_COMMUNITY): Payer: Self-pay

## 2023-10-19 ENCOUNTER — Other Ambulatory Visit (HOSPITAL_COMMUNITY): Payer: Self-pay

## 2023-10-22 ENCOUNTER — Other Ambulatory Visit: Payer: Self-pay

## 2023-10-26 ENCOUNTER — Other Ambulatory Visit: Payer: Self-pay

## 2023-10-26 NOTE — Progress Notes (Signed)
Specialty Pharmacy Refill Coordination Note  [name] is a 62 y.o. male contacted today regarding refills of specialty medication(s) Bictegravir-Emtricitab-Tenofov [employer]); Darunavir-Cobicistat ([name])   Patient requested Delivery   Delivery date: 10/30/23   Verified address: [address]  [place] [address]   Medication will be filled on 10/29/23.

## 2023-10-29 ENCOUNTER — Other Ambulatory Visit: Payer: Self-pay

## 2023-11-01 ENCOUNTER — Other Ambulatory Visit: Payer: Self-pay | Admitting: Student

## 2023-11-01 ENCOUNTER — Other Ambulatory Visit: Payer: Self-pay | Admitting: Internal Medicine

## 2023-11-01 ENCOUNTER — Other Ambulatory Visit (HOSPITAL_COMMUNITY): Payer: Self-pay

## 2023-11-01 DIAGNOSIS — G629 Polyneuropathy, unspecified: Secondary | ICD-10-CM

## 2023-11-01 NOTE — Telephone Encounter (Signed)
 Appt 09/04/23

## 2023-11-02 NOTE — Telephone Encounter (Addendum)
 Last office visit: 09/04/2023 Last UDS: 12/17/2015 Last Refill: 10/17/2023 #26 (13 day supply) Next appt: none scheduled for pcp but has coumadin  clinic appt on 12/10/2023  Will send to pcp/team for review Please review qty  Rx for 13 days supply vs 1 mth supply

## 2023-11-03 ENCOUNTER — Other Ambulatory Visit (HOSPITAL_COMMUNITY): Payer: Self-pay

## 2023-11-03 ENCOUNTER — Other Ambulatory Visit: Payer: Self-pay | Admitting: Internal Medicine

## 2023-11-03 ENCOUNTER — Other Ambulatory Visit (HOSPITAL_BASED_OUTPATIENT_CLINIC_OR_DEPARTMENT_OTHER): Payer: Self-pay

## 2023-11-03 DIAGNOSIS — G629 Polyneuropathy, unspecified: Secondary | ICD-10-CM

## 2023-11-05 ENCOUNTER — Other Ambulatory Visit: Payer: Self-pay

## 2023-11-05 MED ORDER — OXYCODONE HCL 5 MG PO TABS
5.0000 mg | ORAL_TABLET | Freq: Two times a day (BID) | ORAL | 0 refills | Status: DC | PRN
  Filled 2023-11-05: qty 60, 30d supply, fill #0

## 2023-11-06 ENCOUNTER — Other Ambulatory Visit (HOSPITAL_COMMUNITY): Payer: Self-pay

## 2023-11-06 ENCOUNTER — Other Ambulatory Visit: Payer: Self-pay

## 2023-11-13 ENCOUNTER — Other Ambulatory Visit: Payer: Self-pay

## 2023-11-13 ENCOUNTER — Ambulatory Visit (INDEPENDENT_AMBULATORY_CARE_PROVIDER_SITE_OTHER): Payer: 59 | Admitting: Pharmacist

## 2023-11-13 ENCOUNTER — Other Ambulatory Visit (HOSPITAL_COMMUNITY): Payer: Self-pay

## 2023-11-13 ENCOUNTER — Encounter: Payer: Self-pay | Admitting: Infectious Disease

## 2023-11-13 DIAGNOSIS — Z86718 Personal history of other venous thrombosis and embolism: Secondary | ICD-10-CM | POA: Diagnosis not present

## 2023-11-13 DIAGNOSIS — Z7901 Long term (current) use of anticoagulants: Secondary | ICD-10-CM

## 2023-11-13 DIAGNOSIS — I82409 Acute embolism and thrombosis of unspecified deep veins of unspecified lower extremity: Secondary | ICD-10-CM

## 2023-11-13 HISTORY — DX: Acute embolism and thrombosis of unspecified deep veins of unspecified lower extremity: I82.409

## 2023-11-13 LAB — POCT INR: INR: 1.5 — AB (ref 2.0–3.0)

## 2023-11-13 MED ORDER — WARFARIN SODIUM 5 MG PO TABS
ORAL_TABLET | ORAL | 2 refills | Status: DC
  Filled 2023-11-13: qty 108, 89d supply, fill #0
  Filled 2024-02-06: qty 108, 89d supply, fill #1

## 2023-11-13 NOTE — Progress Notes (Signed)
 Anticoagulation Management [name] is a 63 y.o. male who reports to the clinic for monitoring of warfarin treatment.    Indication: DVT, history of; long term current use of oral anticoagulation with warfarin to maintain INR range 2.0 - 3.0.   Duration: indefinite Supervising physician:  Carolyn Guilloud, MD  Anticoagulation Clinic Visit History: Patient does report signs/symptoms of bleeding or thromboembolism See patient findings for today endorsing epistaxis (now resolved after pressure to his nose.) Other recent changes: No diet, medications, lifestyle changes except in new apartment, stating that the air is dry/no humidity. Anticoagulation Episode Summary     Current INR goal:  2.0-3.0  TTR:  79.1% (12.8 y)  Next INR check:  01/07/2024  INR from last check:  1.5 (11/13/2023)  Weekly max warfarin dose:  --  Target end date:  Indefinite  INR check location:  Anticoagulation Clinic  Preferred lab:  --  Send INR reminders to:  [facility]   Indications   History of DVT (deep vein thrombosis) [Z86.718] Long term current use of anticoagulant [Z79.01]        Comments:  --        Anticoagulation Care Providers     Provider Role Specialty Phone number   Elaine Rush, MD  Infectious Diseases [phone]       Allergies  Allergen Reactions   Dapsone     REACTION: fever   Megestrol     Sulfa Antibiotics Other (See Comments)    put's me down bad   Sulfamethoxazole-Trimethoprim     REACTION: fever    Current Outpatient Medications:    ASPIRIN 81 PO, Take 81 mg by mouth daily. (Patient not taking: Reported on 11/13/2023), Disp: , Rfl:    atorvastatin  (LIPITOR) 40 MG tablet, Take 1 tablet (40 mg total) by mouth daily., Disp: 90 tablet, Rfl: 3   bictegravir-emtricitabine -tenofovir  AF (BIKTARVY ) 50-200-25 MG TABS tablet, Take 1 tablet by mouth daily., Disp: 30 tablet, Rfl: 11   darunavir -cobicistat  ([name] ) 800-150 MG tablet, TAKE 1 TABLET BY MOUTH EVERY DAY DO  NOT CRUSH BREAK OR CHEW TABLETS TAKE WITH FOOD, Disp: 30 tablet, Rfl: 11   Ensure (ENSURE), Take 1 Can by mouth 4 (four) times daily - after meals and at bedtime., Disp: 237 mL, Rfl: 11   gabapentin  (NEURONTIN ) 400 MG capsule, Take 1 capsule (400 mg total) by mouth 4 (four) times daily., Disp: 360 capsule, Rfl: 3   oxyCODONE  (OXY IR/ROXICODONE ) 5 MG immediate release tablet, Take 1 tablet (5 mg total) by mouth 2 (two) times daily as needed for severe pain (pain score 7-10)., Disp: 60 tablet, Rfl: 0   pantoprazole  (PROTONIX ) 40 MG tablet, TAKE 1 TABLET(40 MG) BY MOUTH DAILY, Disp: 90 tablet, Rfl: 0   warfarin (COUMADIN ) 5 MG tablet, TAKE 1 AND 1/2 TABLETS BY MOUTH ON MONDAYS,  WEDNESDAYS, AND FRIDAYS.  ALL OTHER DAYS, TAKE ONLY 1 TABLET., Disp: 108 tablet, Rfl: 2 Past Medical History:  Diagnosis Date   Chronic kidney disease    History of chicken pox    History of DVT of lower extremity    right   HIV (human immunodeficiency virus infection) (HCC)    Hyperlipidemia    Left hip pain 03/09/2013   Pneumonia    Protein malnutrition (HCC) 08/2015   Social History   Socioeconomic History   Marital status: Widowed    Spouse name: Not on file   Number of children: 3   Years of education: Not on file   Highest education level:  Not on file  Occupational History    Employer: UNEMPLOYED  Tobacco Use   Smoking status: Every Day    Current packs/day: 1.00    Average packs/day: 1 pack/day for 36.0 years (36.0 ttl pk-yrs)    Types: Cigarettes   Smokeless tobacco: Never   Tobacco comments:    1 pk a day   Vaping Use   Vaping status: Never Used  Substance and Sexual Activity   Alcohol use: No    Alcohol/week: 0.0 standard drinks of alcohol   Drug use: Not Currently    Frequency: 2.0 times per week    Types: Marijuana   Sexual activity: Not Currently    Comment: denied condoms; states abstinent since wife passed a long time ago  Other Topics Concern   Not on file  Social History Narrative    Not on file   Social Drivers of Health   Financial Resource Strain: Low Risk  (07/26/2023)   Overall Financial Resource Strain (CARDIA)    Difficulty of Paying Living Expenses: Not hard at all  Food Insecurity: No Food Insecurity (07/26/2023)   Hunger Vital Sign    Worried About Running Out of Food in the Last Year: Never true    Ran Out of Food in the Last Year: Never true  Transportation Needs: No Transportation Needs (07/26/2023)   PRAPARE - Administrator, Civil Service (Medical): No    Lack of Transportation (Non-Medical): No  Physical Activity: Inactive (07/26/2023)   Exercise Vital Sign    Days of Exercise per Week: 0 days    Minutes of Exercise per Session: 0 min  Stress: No Stress Concern Present (07/26/2023)   [employer] of Occupational Health - Occupational Stress Questionnaire    Feeling of Stress : Only a little  Social Connections: Socially Isolated (07/26/2023)   Social Connection and Isolation Panel [NHANES]    Frequency of Communication with Friends and Family: More than three times a week    Frequency of Social Gatherings with Friends and Family: Twice a week    Attends Religious Services: Never    Database Administrator or Organizations: No    Attends Banker Meetings: Never    Marital Status: Widowed   Family History  Problem Relation Age of Onset   Arthritis Mother    Kidney disease Maternal Grandfather     ASSESSMENT Recent Results: The most recent result is correlated with 42.5 mg per week: Lab Results  Component Value Date   INR 1.5 (A) 11/13/2023   INR 2.1 10/15/2023   INR 2.2 08/20/2023   PROTIME 38.4 (H) 04/01/2012    Anticoagulation Dosing: Description   Take 1 & 1/2 tablets of your peach-colored 5mg  strength warfarin tablets on MONDAYS, WEDNESDAYS and FRIDAYS. All other days, Sundays, Tuesdays, Thursdays, and Saturdays, take ONLY ONE (1) tablet.        INR today: Subtherapeutic  PLAN Weekly dose was  unchanged.   Patient Instructions  Patient instructed to take medications as defined in the Anti-coagulation Track section of this encounter.  Patient instructed to take today's dose.  Patient instructed to take 1 & 1/2 tablets of your peach-colored 5mg  strength warfarin tablets on MONDAYS, WEDNESDAYS and FRIDAYS. All other days, Sundays, Tuesdays, Thursdays, and Saturdays, take ONLY ONE (1) tablet.   Patient verbalized understanding of these instructions.  Patient advised to contact clinic or seek medical attention if signs/symptoms of bleeding or thromboembolism occur.  Patient verbalized understanding by repeating back information  and was advised to contact me if further medication-related questions arise. Patient was also provided an information handout.  Follow-up Return in 8 weeks (on 01/07/2024) for Follow up INR.  Lynwood KATHEE Lites, PharmD, CPP  15 minutes spent face-to-face with the patient during the encounter. 50% of time spent on education, including signs/sx bleeding and clotting, as well as food and drug interactions with warfarin. 50% of time was spent on fingerprick POC INR sample collection,processing, results determination, and documentation in [email or web address].

## 2023-11-13 NOTE — Progress Notes (Signed)
 INTERNAL MEDICINE TEACHING ATTENDING ADDENDUM   I agree with pharmacy recommendations as outlined in their note.   Saw patient briefly for complaint of intermittent nosebleeds. Described as mild, not currently bleeding on my exam. Left nare is erythematous and mucosa appears inflamed, he reports picking nose hairs. INR is 1.5; he is also on ASA for primary prevention. Advised he avoid picking, utilize nasal saline irrigation to keep his nares moist especially during the winter, ok to hold aspirin for a day or two until nosebleeds resolve.   Terrell Ostrand, MD

## 2023-11-13 NOTE — Patient Instructions (Signed)
Patient instructed to take medications as defined in the Anti-coagulation Track section of this encounter.  Patient instructed to take today's dose.  Patient instructed to take 1 & 1/2 tablets of your peach-colored 5mg  strength warfarin tablets on MONDAYS, WEDNESDAYS and FRIDAYS. All other days, Sundays, Tuesdays, Thursdays, and Saturdays, take ONLY ONE (1) tablet.    Patient verbalized understanding of these instructions.

## 2023-11-14 ENCOUNTER — Ambulatory Visit (INDEPENDENT_AMBULATORY_CARE_PROVIDER_SITE_OTHER): Payer: 59 | Admitting: Infectious Disease

## 2023-11-14 ENCOUNTER — Other Ambulatory Visit (HOSPITAL_COMMUNITY): Payer: Self-pay

## 2023-11-14 ENCOUNTER — Encounter: Payer: Self-pay | Admitting: Infectious Disease

## 2023-11-14 ENCOUNTER — Other Ambulatory Visit: Payer: Self-pay

## 2023-11-14 VITALS — BP 135/62 | HR 94 | Temp 97.5°F | Ht 71.0 in | Wt 118.0 lb

## 2023-11-14 DIAGNOSIS — G546 Phantom limb syndrome with pain: Secondary | ICD-10-CM

## 2023-11-14 DIAGNOSIS — R634 Abnormal weight loss: Secondary | ICD-10-CM | POA: Diagnosis not present

## 2023-11-14 DIAGNOSIS — E785 Hyperlipidemia, unspecified: Secondary | ICD-10-CM

## 2023-11-14 DIAGNOSIS — Z23 Encounter for immunization: Secondary | ICD-10-CM | POA: Diagnosis not present

## 2023-11-14 DIAGNOSIS — I824Z9 Acute embolism and thrombosis of unspecified deep veins of unspecified distal lower extremity: Secondary | ICD-10-CM

## 2023-11-14 DIAGNOSIS — Z5900 Homelessness unspecified: Secondary | ICD-10-CM

## 2023-11-14 DIAGNOSIS — B20 Human immunodeficiency virus [HIV] disease: Secondary | ICD-10-CM | POA: Diagnosis not present

## 2023-11-14 DIAGNOSIS — F17200 Nicotine dependence, unspecified, uncomplicated: Secondary | ICD-10-CM | POA: Diagnosis not present

## 2023-11-14 DIAGNOSIS — G629 Polyneuropathy, unspecified: Secondary | ICD-10-CM

## 2023-11-14 MED ORDER — PREZCOBIX 800-150 MG PO TABS
ORAL_TABLET | ORAL | 11 refills | Status: DC
  Filled 2023-11-14: qty 30, fill #0
  Filled 2023-11-21: qty 30, 30d supply, fill #0
  Filled 2023-12-19: qty 30, 30d supply, fill #1
  Filled 2024-01-14: qty 30, 30d supply, fill #2
  Filled 2024-02-13: qty 30, 30d supply, fill #3
  Filled 2024-03-14: qty 30, 30d supply, fill #4
  Filled 2024-04-16: qty 30, 30d supply, fill #5
  Filled 2024-05-08 – 2024-05-09 (×2): qty 30, 30d supply, fill #6

## 2023-11-14 MED ORDER — BIKTARVY 50-200-25 MG PO TABS
1.0000 | ORAL_TABLET | Freq: Every day | ORAL | 11 refills | Status: DC
  Filled 2023-11-14 – 2023-11-21 (×2): qty 30, 30d supply, fill #0
  Filled 2023-12-19: qty 30, 30d supply, fill #1
  Filled 2024-01-14: qty 30, 30d supply, fill #2
  Filled 2024-02-13: qty 30, 30d supply, fill #3
  Filled 2024-03-14: qty 30, 30d supply, fill #4
  Filled 2024-04-14 – 2024-04-16 (×2): qty 30, 30d supply, fill #5
  Filled 2024-05-08 – 2024-05-09 (×2): qty 30, 30d supply, fill #6

## 2023-11-14 MED ORDER — GABAPENTIN 400 MG PO CAPS
400.0000 mg | ORAL_CAPSULE | Freq: Four times a day (QID) | ORAL | 3 refills | Status: AC
  Filled 2023-11-14: qty 360, 90d supply, fill #0
  Filled 2024-03-06: qty 360, 90d supply, fill #1
  Filled 2024-05-30: qty 360, 90d supply, fill #2
  Filled 2024-08-29: qty 360, 90d supply, fill #3

## 2023-11-14 MED ORDER — ROSUVASTATIN CALCIUM 10 MG PO TABS
10.0000 mg | ORAL_TABLET | Freq: Every day | ORAL | 11 refills | Status: DC
  Filled 2023-11-14: qty 30, 30d supply, fill #0
  Filled 2023-12-04: qty 30, 30d supply, fill #1
  Filled 2024-01-04 – 2024-01-07 (×3): qty 30, 30d supply, fill #2
  Filled 2024-02-06: qty 30, 30d supply, fill #3
  Filled 2024-03-06: qty 30, 30d supply, fill #4
  Filled 2024-04-07: qty 30, 30d supply, fill #5
  Filled 2024-05-05: qty 30, 30d supply, fill #6
  Filled 2024-06-04: qty 30, 30d supply, fill #7
  Filled 2024-07-02: qty 30, 30d supply, fill #8
  Filled 2024-07-30: qty 30, 30d supply, fill #9
  Filled 2024-08-29: qty 30, 30d supply, fill #10
  Filled 2024-09-26: qty 30, 30d supply, fill #11

## 2023-11-14 NOTE — Addendum Note (Signed)
 Addended by: Gypsy Lesser on: 11/14/2023 11:38 AM   Modules accepted: Orders

## 2023-11-14 NOTE — Progress Notes (Signed)
 Subjective:  Chief complaint: followup for HIV disease on medications   Patient ID: [name], male    DOB: [birth year], 63 y.o.   MRN: [id number]  HPI  Discussed the use of AI scribe software for clinical note transcription with the patient, who gave verbal consent to proceed.  History of Present Illness   The patient, with a history of below the knee  amputation with phantom limb pain and histoyr of DVT and on long-term warfarin therapy, has been regularly monitored by internal medicine. He has been on warfarin since his leg amputation in 2004. The patient is also on Biktarvy  and [name]  for HIV management, with an undetectable viral load and a healthy CD4 count. He has been compliant with his HIV medication for over eighteen years.  The patient has experienced significant weight loss, which he attributes to unstable housing and limited access to food. He also reports a reduction in smoking, now consuming a pack of cigarettes every four days due to living in a non-smoking apartment. He now has housing and access to food but states he has always had low appetite.  The patient is also on oxycodone  from internal medicine and gabapentin , prescribed by the current provider, for pain management. He has not received his last refill of gabapentin . The patient is also on 81mg  of aspirin.       Past Medical History:  Diagnosis Date   Chronic kidney disease    DVT (deep venous thrombosis) (HCC) 11/13/2023   History of chicken pox    History of DVT of lower extremity    right   HIV (human immunodeficiency virus infection) (HCC)    Hyperlipidemia    Left hip pain 03/09/2013   Pneumonia    Protein malnutrition (HCC) 08/31/2015    Past Surgical History:  Procedure Laterality Date   APPENDECTOMY  12-23-1960   LEG AMPUTATION ABOVE KNEE  2010   right leg    Family History  Problem Relation Age of Onset   Arthritis Mother    Kidney disease Maternal Grandfather       Social History    Socioeconomic History   Marital status: Widowed    Spouse name: Not on file   Number of children: 3   Years of education: Not on file   Highest education level: Not on file  Occupational History    Employer: UNEMPLOYED  Tobacco Use   Smoking status: Every Day    Current packs/day: 1.00    Average packs/day: 1 pack/day for 36.0 years (36.0 ttl pk-yrs)    Types: Cigarettes   Smokeless tobacco: Never   Tobacco comments:    1 pk a day   Vaping Use   Vaping status: Never Used  Substance and Sexual Activity   Alcohol use: No    Alcohol/week: 0.0 standard drinks of alcohol   Drug use: Not Currently    Frequency: 2.0 times per week    Types: Marijuana   Sexual activity: Not Currently    Comment: DECLINED CONDOMS  Other Topics Concern   Not on file  Social History Narrative   Not on file   Social Drivers of Health   Financial Resource Strain: Low Risk  (07/26/2023)   Overall Financial Resource Strain (CARDIA)    Difficulty of Paying Living Expenses: Not hard at all  Food Insecurity: No Food Insecurity (07/26/2023)   Hunger Vital Sign    Worried About Running Out of Food in the Last Year: Never true    Ran  Out of Food in the Last Year: Never true  Transportation Needs: No Transportation Needs (07/26/2023)   PRAPARE - Administrator, Civil Service (Medical): No    Lack of Transportation (Non-Medical): No  Physical Activity: Inactive (07/26/2023)   Exercise Vital Sign    Days of Exercise per Week: 0 days    Minutes of Exercise per Session: 0 min  Stress: No Stress Concern Present (07/26/2023)   [employer] of Occupational Health - Occupational Stress Questionnaire    Feeling of Stress : Only a little  Social Connections: Socially Isolated (07/26/2023)   Social Connection and Isolation Panel [NHANES]    Frequency of Communication with Friends and Family: More than three times a week    Frequency of Social Gatherings with Friends and Family: Twice a week     Attends Religious Services: Never    Database administrator or Organizations: No    Attends Banker Meetings: Never    Marital Status: Widowed    Allergies  Allergen Reactions   Dapsone     REACTION: fever   Megestrol     Sulfa Antibiotics Other (See Comments)    "put's me down bad"   Sulfamethoxazole-Trimethoprim     REACTION: fever     Current Outpatient Medications:    ASPIRIN 81 PO, Take 81 mg by mouth daily., Disp: , Rfl:    Ensure (ENSURE), Take 1 Can by mouth 4 (four) times daily - after meals and at bedtime., Disp: 237 mL, Rfl: 11   oxyCODONE  (OXY IR/ROXICODONE ) 5 MG immediate release tablet, Take 1 tablet (5 mg total) by mouth 2 (two) times daily as needed for severe pain (pain score 7-10)., Disp: 60 tablet, Rfl: 0   rosuvastatin  (CRESTOR ) 10 MG tablet, Take 1 tablet (10 mg total) by mouth daily., Disp: 30 tablet, Rfl: 11   warfarin (COUMADIN ) 5 MG tablet, TAKE 1 AND 1/2 TABLETS BY MOUTH ON MONDAYS,  WEDNESDAYS, AND FRIDAYS.  ALL OTHER DAYS, TAKE ONLY 1 TABLET., Disp: 108 tablet, Rfl: 2   bictegravir-emtricitabine -tenofovir  AF (BIKTARVY ) 50-200-25 MG TABS tablet, Take 1 tablet by mouth daily., Disp: 30 tablet, Rfl: 11   darunavir -cobicistat  ([name] ) 800-150 MG tablet, TAKE 1 TABLET BY MOUTH EVERY DAY DO NOT CRUSH BREAK OR CHEW TABLETS TAKE WITH FOOD, Disp: 30 tablet, Rfl: 11   gabapentin  (NEURONTIN ) 400 MG capsule, Take 1 capsule (400 mg total) by mouth 4 (four) times daily. Please mail gabapentin  with crestor  and ARV to pt, Disp: 360 capsule, Rfl: 3    Review of Systems  Constitutional:  Negative for activity change, appetite change, chills, diaphoresis, fatigue, fever and unexpected weight change.  HENT:  Negative for congestion, rhinorrhea, sinus pressure, sneezing, sore throat and trouble swallowing.   Eyes:  Negative for photophobia and visual disturbance.  Respiratory:  Negative for cough, chest tightness, shortness of breath, wheezing and stridor.    Cardiovascular:  Negative for chest pain, palpitations and leg swelling.  Gastrointestinal:  Negative for abdominal distention, abdominal pain, anal bleeding, blood in stool, constipation, diarrhea, nausea and vomiting.  Genitourinary:  Negative for difficulty urinating, dysuria, flank pain and hematuria.  Musculoskeletal:  Negative for arthralgias, back pain, gait problem, joint swelling and myalgias.  Skin:  Negative for color change, pallor, rash and wound.  Neurological:  Negative for dizziness, tremors, weakness and light-headedness.  Hematological:  Negative for adenopathy. Does not bruise/bleed easily.  Psychiatric/Behavioral:  Negative for agitation, behavioral problems, confusion, decreased concentration, dysphoric mood and sleep disturbance.  Objective:   Physical Exam Constitutional:      Appearance: He is well-developed.  HENT:     Head: Normocephalic and atraumatic.  Eyes:     Conjunctiva/sclera: Conjunctivae normal.  Cardiovascular:     Rate and Rhythm: Normal rate and regular rhythm.  Pulmonary:     Effort: Pulmonary effort is normal. No respiratory distress.     Breath sounds: No wheezing.  Abdominal:     General: There is no distension.     Palpations: Abdomen is soft.  Musculoskeletal:        General: No tenderness. Normal range of motion.     Cervical back: Normal range of motion and neck supple.  Skin:    General: Skin is warm and dry.     Coloration: Skin is not pale.     Findings: No erythema or rash.  Neurological:     General: No focal deficit present.     Mental Status: He is alert and oriented to person, place, and time.  Psychiatric:        Mood and Affect: Mood normal.        Behavior: Behavior normal.        Thought Content: Thought content normal.        Judgment: Judgment normal.           Assessment & Plan:   Assessment and Plan    Anticoagulation Long-term Warfarin use since 2004, with frequent monitoring. Discussed the  possibility of switching to a newer anticoagulant that requires less monitoring. -Reach out to internal medicine to discuss the possibility of switching to a newer anticoagulant.  HIV Stable on combination of Biktarvy  and [name] , with undetectable viral load and healthy CD4 count. Noted possible resistance necessitating the combination therapy. -Continue current regimen of Biktarvy  and [name] . -Check labs today to monitor HIV status including  viral load CD4 --investigate his R history because maybe he does not require such a complicated regimen  Hyperlipidemia Not currently on a statin, but recommended due to age and HIV status. -Start statin therapy to reduce cholesterol and fight inflammation. --will start crestor  10mg  there is DDI with cobi  Unintentional weight loss Reported weight loss due to unstable housing and food insecurity. No other symptoms suggestive of malignancy. -Monitor weight closely. -Schedule follow-up in 6 months to reassess weight and overall health.  Tobacco use Reduced smoking due to living in a no-smoking apartment. Discussed the possibility of vaping as a harm reduction strategy. -Encourage continued efforts to quit smoking.  Pain management On Oxycodone  from internal medicine and Gabapentin  from this clinic. -Continue current pain management regimen. -Send Gabapentin  to [facility] pharmacy for delivery with HIV meds.  Vaccinations Not yet received flu, pneumonia, or COVID vaccines this season. -Discuss with nursing staff about administering vaccines. -Check vaccination status and schedule necessary vaccines.

## 2023-11-16 ENCOUNTER — Emergency Department (HOSPITAL_COMMUNITY)
Admission: EM | Admit: 2023-11-16 | Discharge: 2023-11-17 | Discharge status: Against Medical Advice | Payer: 59 | Attending: Emergency Medicine | Admitting: Emergency Medicine

## 2023-11-16 ENCOUNTER — Other Ambulatory Visit: Payer: Self-pay

## 2023-11-16 DIAGNOSIS — Z5321 Procedure and treatment not carried out due to patient leaving prior to being seen by health care provider: Secondary | ICD-10-CM | POA: Insufficient documentation

## 2023-11-16 DIAGNOSIS — M25552 Pain in left hip: Secondary | ICD-10-CM | POA: Insufficient documentation

## 2023-11-16 DIAGNOSIS — Y92410 Unspecified street and highway as the place of occurrence of the external cause: Secondary | ICD-10-CM | POA: Diagnosis not present

## 2023-11-16 DIAGNOSIS — M79642 Pain in left hand: Secondary | ICD-10-CM | POA: Diagnosis present

## 2023-11-16 DIAGNOSIS — R519 Headache, unspecified: Secondary | ICD-10-CM | POA: Diagnosis not present

## 2023-11-16 NOTE — ED Notes (Signed)
Patient left.
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
## 2023-11-17 LAB — CBC WITH DIFFERENTIAL/PLATELET
Absolute Lymphocytes: 3278 {cells}/uL (ref 850–3900)
Absolute Monocytes: 351 {cells}/uL (ref 200–950)
Basophils Absolute: 22 {cells}/uL (ref 0–200)
Basophils Relative: 0.4 %
Eosinophils Absolute: 22 {cells}/uL (ref 15–500)
Eosinophils Relative: 0.4 %
HCT: 47.1 % (ref 38.5–50.0)
Hemoglobin: 16.2 g/dL (ref 13.2–17.1)
MCH: 34.2 pg — ABNORMAL HIGH (ref 27.0–33.0)
MCHC: 34.4 g/dL (ref 32.0–36.0)
MCV: 99.6 fL (ref 80.0–100.0)
MPV: 9.7 fL (ref 7.5–12.5)
Monocytes Relative: 6.5 %
Neutro Abs: 1728 {cells}/uL (ref 1500–7800)
Neutrophils Relative %: 32 %
Platelets: 268 10*3/uL (ref 140–400)
RBC: 4.73 10*6/uL (ref 4.20–5.80)
RDW: 13.7 % (ref 11.0–15.0)
Total Lymphocyte: 60.7 %
WBC: 5.4 10*3/uL (ref 3.8–10.8)

## 2023-11-17 LAB — LIPID PANEL
Cholesterol: 246 mg/dL — ABNORMAL HIGH (ref <200)
HDL: 49 mg/dL (ref >=40)
LDL Cholesterol (Calc): 176 mg/dL — ABNORMAL HIGH
Non-HDL Cholesterol (Calc): 197 mg/dL — ABNORMAL HIGH (ref <130)
Total CHOL/HDL Ratio: 5 (calc) — ABNORMAL HIGH (ref <5.0)
Triglycerides: 93 mg/dL (ref <150)

## 2023-11-17 LAB — COMPLETE METABOLIC PANEL WITH GFR
AG Ratio: 1.5 (calc) (ref 1.0–2.5)
ALT: 12 U/L (ref 9–46)
AST: 19 U/L (ref 10–35)
Albumin: 4.5 g/dL (ref 3.6–5.1)
Alkaline phosphatase (APISO): 72 U/L (ref 35–144)
BUN/Creatinine Ratio: 10 (calc) (ref 6–22)
BUN: 14 mg/dL (ref 7–25)
CO2: 28 mmol/L (ref 20–32)
Calcium: 9.8 mg/dL (ref 8.6–10.3)
Chloride: 103 mmol/L (ref 98–110)
Creat: 1.37 mg/dL — ABNORMAL HIGH (ref 0.70–1.35)
Globulin: 3 g/dL (ref 1.9–3.7)
Glucose, Bld: 78 mg/dL (ref 65–99)
Potassium: 3.9 mmol/L (ref 3.5–5.3)
Sodium: 140 mmol/L (ref 135–146)
Total Bilirubin: 0.8 mg/dL (ref 0.2–1.2)
Total Protein: 7.5 g/dL (ref 6.1–8.1)
eGFR: 58 mL/min/{1.73_m2} — ABNORMAL LOW (ref >=60)

## 2023-11-17 LAB — HIV RNA, RTPCR W/R GT (RTI, PI,INT)
HIV 1 RNA Quant: NOT DETECTED {copies}/mL
HIV-1 RNA Quant, Log: NOT DETECTED {Log}

## 2023-11-17 LAB — RPR: RPR Ser Ql: NONREACTIVE

## 2023-11-17 LAB — T-HELPER CELLS (CD4) COUNT (NOT AT ARMC)
Absolute CD4: 891 {cells}/uL (ref 490–1740)
CD4 T Helper %: 27 % — ABNORMAL LOW (ref 30–61)
Total lymphocyte count: 3339 {cells}/uL (ref 850–3900)

## 2023-11-21 ENCOUNTER — Other Ambulatory Visit: Payer: Self-pay

## 2023-11-21 ENCOUNTER — Other Ambulatory Visit (HOSPITAL_COMMUNITY): Payer: Self-pay

## 2023-11-21 ENCOUNTER — Other Ambulatory Visit (HOSPITAL_BASED_OUTPATIENT_CLINIC_OR_DEPARTMENT_OTHER): Payer: Self-pay

## 2023-11-21 NOTE — Progress Notes (Signed)
Specialty Pharmacy Refill Coordination Note  [name] is a 63 y.o. male contacted today regarding refills of specialty medication(s) Bictegravir-Emtricitab-Tenofov Musician); Darunavir-Cobicistat ([name])   Patient requested Delivery   Delivery date: 11/29/23   Verified address: [address], [address]  [place] [address]   Medication will be filled on 11/28/23.

## 2023-12-04 ENCOUNTER — Other Ambulatory Visit (HOSPITAL_COMMUNITY): Payer: Self-pay

## 2023-12-04 ENCOUNTER — Other Ambulatory Visit: Payer: Self-pay | Admitting: Student

## 2023-12-05 ENCOUNTER — Other Ambulatory Visit: Payer: Self-pay

## 2023-12-06 ENCOUNTER — Other Ambulatory Visit (HOSPITAL_COMMUNITY): Payer: Self-pay

## 2023-12-06 ENCOUNTER — Other Ambulatory Visit: Payer: Self-pay

## 2023-12-06 MED ORDER — OXYCODONE HCL 5 MG PO TABS
5.0000 mg | ORAL_TABLET | Freq: Two times a day (BID) | ORAL | 0 refills | Status: DC | PRN
  Filled 2023-12-06: qty 60, 30d supply, fill #0

## 2023-12-10 ENCOUNTER — Ambulatory Visit (INDEPENDENT_AMBULATORY_CARE_PROVIDER_SITE_OTHER): Payer: 59 | Admitting: Student

## 2023-12-10 ENCOUNTER — Other Ambulatory Visit (HOSPITAL_COMMUNITY): Payer: Self-pay

## 2023-12-10 ENCOUNTER — Other Ambulatory Visit: Payer: Self-pay

## 2023-12-10 ENCOUNTER — Other Ambulatory Visit (HOSPITAL_BASED_OUTPATIENT_CLINIC_OR_DEPARTMENT_OTHER): Payer: Self-pay

## 2023-12-10 ENCOUNTER — Encounter: Payer: Self-pay | Admitting: Internal Medicine

## 2023-12-10 ENCOUNTER — Ambulatory Visit (INDEPENDENT_AMBULATORY_CARE_PROVIDER_SITE_OTHER): Payer: 59 | Admitting: Pharmacist

## 2023-12-10 VITALS — BP 118/56 | HR 100 | Temp 98.3°F | Ht 71.0 in | Wt 120.4 lb

## 2023-12-10 DIAGNOSIS — D6859 Other primary thrombophilia: Secondary | ICD-10-CM | POA: Insufficient documentation

## 2023-12-10 DIAGNOSIS — E785 Hyperlipidemia, unspecified: Secondary | ICD-10-CM | POA: Diagnosis not present

## 2023-12-10 DIAGNOSIS — Z7901 Long term (current) use of anticoagulants: Secondary | ICD-10-CM

## 2023-12-10 DIAGNOSIS — Z86718 Personal history of other venous thrombosis and embolism: Secondary | ICD-10-CM

## 2023-12-10 DIAGNOSIS — G546 Phantom limb syndrome with pain: Secondary | ICD-10-CM

## 2023-12-10 DIAGNOSIS — B20 Human immunodeficiency virus [HIV] disease: Secondary | ICD-10-CM

## 2023-12-10 DIAGNOSIS — I739 Peripheral vascular disease, unspecified: Secondary | ICD-10-CM

## 2023-12-10 DIAGNOSIS — Z89611 Acquired absence of right leg above knee: Secondary | ICD-10-CM

## 2023-12-10 DIAGNOSIS — Z79891 Long term (current) use of opiate analgesic: Secondary | ICD-10-CM

## 2023-12-10 LAB — POCT INR: INR: 2.2 (ref 2.0–3.0)

## 2023-12-10 MED ORDER — OXYCODONE HCL 5 MG PO TABS
5.0000 mg | ORAL_TABLET | Freq: Two times a day (BID) | ORAL | 0 refills | Status: DC | PRN
  Filled 2024-02-06: qty 60, 30d supply, fill #0

## 2023-12-10 MED ORDER — OXYCODONE HCL 5 MG PO TABS
5.0000 mg | ORAL_TABLET | Freq: Two times a day (BID) | ORAL | 0 refills | Status: DC | PRN
  Filled 2024-03-06: qty 60, 30d supply, fill #0

## 2023-12-10 MED ORDER — RIVAROXABAN 2.5 MG PO TABS
2.5000 mg | ORAL_TABLET | Freq: Two times a day (BID) | ORAL | 11 refills | Status: DC
  Filled 2023-12-10: qty 60, 30d supply, fill #0
  Filled 2024-01-04 – 2024-01-07 (×3): qty 60, 30d supply, fill #1
  Filled 2024-02-09 (×2): qty 60, 30d supply, fill #2
  Filled 2024-03-06: qty 60, 30d supply, fill #3
  Filled 2024-04-07: qty 60, 30d supply, fill #4
  Filled 2024-05-05: qty 60, 30d supply, fill #5
  Filled 2024-06-04: qty 60, 30d supply, fill #6
  Filled 2024-07-02: qty 60, 30d supply, fill #7
  Filled 2024-07-30: qty 60, 30d supply, fill #8
  Filled 2024-08-29: qty 60, 30d supply, fill #9
  Filled 2024-09-26: qty 60, 30d supply, fill #10
  Filled 2024-10-23: qty 60, 30d supply, fill #11

## 2023-12-10 MED ORDER — OXYCODONE HCL 5 MG PO TABS
5.0000 mg | ORAL_TABLET | Freq: Two times a day (BID) | ORAL | 0 refills | Status: DC | PRN
  Filled 2024-01-05: qty 60, 30d supply, fill #0

## 2023-12-10 NOTE — Assessment & Plan Note (Addendum)
 History of PAD and hypercoagulability, follows up with Coumadin  clinic with Dr. Hershell Lose.Also  has a history of DVT currently being managed on warfarin.Patient follows up with Dr Margit Shelling for his anticoagulation management. Patient's ID specialist Dr V reached out to me concerning switching to a DOAC that will require less INR monitoring. I have relayed this information to Dr Margit Shelling and he will discuss this with the patient at their next visit. - Continue Warfarin  - F/U with Dr Groce

## 2023-12-10 NOTE — Patient Instructions (Signed)
 Thank you, [name] for allowing us  to provide your care today. Today we discussed your general health.  I refilled your pain medication.  I have ordered the following labs for you:  Lab Orders  No laboratory test(s) ordered today     Tests ordered today:    Referrals ordered today:   Referral Orders  No referral(s) requested today     I have ordered the following medication/changed the following medications:   Stop the following medications: Medications Discontinued During This Encounter  Medication Reason   oxyCODONE  (OXY IR/ROXICODONE ) 5 MG immediate release tablet Reorder     Start the following medications: Meds ordered this encounter  Medications   oxyCODONE  (OXY IR/ROXICODONE ) 5 MG immediate release tablet    Sig: Take 1 tablet (5 mg total) by mouth 2 (two) times daily as needed for severe pain (pain score 7-10).    Dispense:  60 tablet    Refill:  0    Rx 1/3. 30 day script   oxyCODONE  (OXY IR/ROXICODONE ) 5 MG immediate release tablet    Sig: Take 1 tablet (5 mg total) by mouth 2 (two) times daily as needed for severe pain (pain score 7-10).    Dispense:  60 tablet    Refill:  0    Rx 2/3. 30 day script   oxyCODONE  (OXY IR/ROXICODONE ) 5 MG immediate release tablet    Sig: Take 1 tablet (5 mg total) by mouth 2 (two) times daily as needed for severe pain (pain score 7-10).    Dispense:  60 tablet    Refill:  0    Rx 3/3. 30 day script     Follow up: 3 months   Remember:   Should you have any questions or concerns please call the internal medicine clinic at [phone].    Fay Hoop, M.D [facility]

## 2023-12-10 NOTE — Assessment & Plan Note (Signed)
 Controlled and stable on gabapentin . No concerns at this time.

## 2023-12-10 NOTE — Assessment & Plan Note (Signed)
 Stable status post AKA.  Stable in a wheelchair.  No concerns at this time

## 2023-12-10 NOTE — Assessment & Plan Note (Signed)
 History of DVT currently being managed on warfarin.Patient follows up with Dr Margit Shelling for his anticoagulation management. Patient's ID specialist Dr V reached out to me concerning switching to a DOAC that will require less INR monitoring. I have relayed this information to Dr Margit Shelling and he will discuss this with the patient at their next visit. - Continue Warfarin  - F/U with Dr Groce

## 2023-12-10 NOTE — Patient Instructions (Signed)
 Discontinue taking your warfarin/Coumadin .  Commence taking your rivaroxaban /Xarelto , 2.5 mg strength tablets, one-tablet by mouth, twice-daily. Continue taking your 81 mg strength baby aspirin tablet by mouth, once-daily.

## 2023-12-10 NOTE — Progress Notes (Signed)
[name] is a 63 y.o. male who reports to the clinic for monitoring of warfarin,asking the question as to whether he can be started upon a DOAC for his continued follow-on secondary prophylaxis against recurrence of VTE. His initial index event was approximately 19 years ago, a Severe Right DVT that resulted in amputation, patient has elected for life long A/C therapy.    ASSESSMENT Indication(s): DVT and PAD.    Duration: indefinite  Allergies  Allergen Reactions   Dapsone     REACTION: fever   Megestrol    Sulfa Antibiotics Other (See Comments)    "put's me down bad"   Sulfamethoxazole-Trimethoprim     REACTION: fever    Current Outpatient Medications:    ASPIRIN 81 PO, Take 81 mg by mouth daily., Disp: , Rfl:    bictegravir-emtricitabine-tenofovir AF (BIKTARVY) 50-200-25 MG TABS tablet, Take 1 tablet by mouth daily., Disp: 30 tablet, Rfl: 11   darunavir-cobicistat ([name]) 800-150 MG tablet, TAKE 1 TABLET BY MOUTH EVERY DAY DO NOT CRUSH BREAK OR CHEW TABLETS TAKE WITH FOOD, Disp: 30 tablet, Rfl: 11   Ensure (ENSURE), Take 1 Can by mouth 4 (four) times daily - after meals and at bedtime., Disp: 237 mL, Rfl: 11   gabapentin (NEURONTIN) 400 MG capsule, Take 1 capsule (400 mg total) by mouth 4 (four) times daily. Please mail gabapentin with crestor and ARV to pt, Disp: 360 capsule, Rfl: 3   [START ON 01/05/2024] oxyCODONE (OXY IR/ROXICODONE) 5 MG immediate release tablet, Take 1 tablet (5 mg total) by mouth 2 (two) times daily as needed for severe pain (pain score 7-10)., Disp: 60 tablet, Rfl: 0   [START ON 02/04/2024] oxyCODONE (OXY IR/ROXICODONE) 5 MG immediate release tablet, Take 1 tablet (5 mg total) by mouth 2 (two) times daily as needed for severe pain (pain score 7-10)., Disp: 60 tablet, Rfl: 0   [START ON 03/05/2024] oxyCODONE (OXY IR/ROXICODONE) 5 MG immediate release tablet, Take 1 tablet (5 mg total) by mouth 2 (two) times daily as needed for severe pain (pain score 7-10)., Disp:  60 tablet, Rfl: 0   rosuvastatin (CRESTOR) 10 MG tablet, Take 1 tablet (10 mg total) by mouth daily., Disp: 30 tablet, Rfl: 11   warfarin (COUMADIN) 5 MG tablet, TAKE 1 AND 1/2 TABLETS BY MOUTH ON MONDAYS,  WEDNESDAYS, AND FRIDAYS.  ALL OTHER DAYS, TAKE ONLY 1 TABLET., Disp: 108 tablet, Rfl: 2 Past Medical History:  Diagnosis Date   Chronic kidney disease    DVT (deep venous thrombosis) (HCC) 11/13/2023   History of chicken pox    History of DVT of lower extremity    right   HIV (human immunodeficiency virus infection) (HCC)    Hyperlipidemia    Left hip pain 03/09/2013   Pneumonia    Protein malnutrition (HCC) 08/31/2015   Social History   Socioeconomic History   Marital status: Widowed    Spouse name: Not on file   Number of children: 3   Years of education: Not on file   Highest education level: Not on file  Occupational History    Employer: UNEMPLOYED  Tobacco Use   Smoking status: Every Day    Current packs/day: 1.00    Average packs/day: 1 pack/day for 36.0 years (36.0 ttl pk-yrs)    Types: Cigarettes   Smokeless tobacco: Never   Tobacco comments:    1 pk a day   Vaping Use   Vaping status: Never Used  Substance and Sexual Activity   Alcohol  use: No    Alcohol/week: 0.0 standard drinks of alcohol   Drug use: Not Currently    Frequency: 2.0 times per week    Types: Marijuana   Sexual activity: Not Currently    Comment: DECLINED CONDOMS  Other Topics Concern   Not on file  Social History Narrative   Not on file   Social Drivers of Health   Financial Resource Strain: Low Risk  (07/26/2023)   Overall Financial Resource Strain (CARDIA)    Difficulty of Paying Living Expenses: Not hard at all  Food Insecurity: No Food Insecurity (07/26/2023)   Hunger Vital Sign    Worried About Running Out of Food in the Last Year: Never true    Ran Out of Food in the Last Year: Never true  Transportation Needs: No Transportation Needs (07/26/2023)   PRAPARE - Therapist, art (Medical): No    Lack of Transportation (Non-Medical): No  Physical Activity: Inactive (07/26/2023)   Exercise Vital Sign    Days of Exercise per Week: 0 days    Minutes of Exercise per Session: 0 min  Stress: No Stress Concern Present (07/26/2023)   [employer] of Occupational Health - Occupational Stress Questionnaire    Feeling of Stress : Only a little  Social Connections: Socially Isolated (07/26/2023)   Social Connection and Isolation Panel [NHANES]    Frequency of Communication with Friends and Family: More than three times a week    Frequency of Social Gatherings with Friends and Family: Twice a week    Attends Religious Services: Never    Database administrator or Organizations: No    Attends Banker Meetings: Never    Marital Status: Widowed   Family History  Problem Relation Age of Onset   Arthritis Mother    Kidney disease Maternal Grandfather     Labs:    Component Value Date/Time   AST 19 11/14/2023 1046   ALT 12 11/14/2023 1046   NA 140 11/14/2023 1046   NA 136 01/11/2017 1447   K 3.9 11/14/2023 1046   CL 103 11/14/2023 1046   CO2 28 11/14/2023 1046   GLUCOSE 78 11/14/2023 1046   HGBA1C 5.5 08/06/2015 1540   BUN 14 11/14/2023 1046   BUN 7 01/11/2017 1447   CREATININE 1.37 (H) 11/14/2023 1046   CALCIUM 9.8 11/14/2023 1046   GFRNONAA 47 (L) 11/22/2017 1047   GFRAA 55 (L) 11/22/2017 1047   WBC 5.4 11/14/2023 1046   HGB 16.2 11/14/2023 1046   HGB 20.8 (HH) 12/23/2021 1157   HGB 15.2 04/01/2012 1105   HCT 47.1 11/14/2023 1046   HCT 59.0 (H) 12/23/2021 1157   HCT 43.9 04/01/2012 1105   PLT 268 11/14/2023 1046   PLT 235 12/23/2021 1157    A/P rivaroxaban (Xarelto) Dose: 2.5 mg PO, BID with concomitant aspirin 81 mg PO once daily.   Safety: Patient reports no recent signs or symptoms of bleeding, no signs of symptoms of thromboembolism. Medication changes: no.  Renal/hepatic/drug interaction concerns:  no.  Adherence: Patient does correctly recite the dose. Patient reports no known adherence challenges. Pharmacy records indicate refills are consistent.   Patient Instructions: Patient advised to contact clinic or seek medical attention if signs/symptoms of bleeding or thromboembolism occur. Patient verbalized understanding by repeating back information.  Follow-up Recommended labs to consider: CBC and CMP for continued assessment of renal function. Next appointment--as needed. Call with any concerns of symptoms or signs of bleeding which  were discussed with the patient.   Elicia Lamp, PharmD, CPP Clinical Pharmacist  12/10/2023, 11:35 AM

## 2023-12-10 NOTE — Assessment & Plan Note (Signed)
 On oxycodone  5 mg.  Expressed concerns of continuing running out of his pain medications.  States rules regarding prescription of controlled substance adequately discussed with the patient.  Due to restrictions to mobility due to this patient's AKA, patient will benefit from a 90-day oxycodone  prescription and then office visit after that.  Will prescribe scheduled oxycodone  for 90 days and schedule an office visit at that time to assess his pain. - Prescribe oxycodone  5 mg for 3 months -Follow-up in 3 months

## 2023-12-10 NOTE — Progress Notes (Addendum)
 CC: Routine follow-up  HPI:  [name] is a 63 y.o. male living with a history stated below and presents today for to follow-up and also discuss his other chronic conditions. Please see problem based assessment and plan for additional details.  Past Medical History:  Diagnosis Date   Chronic kidney disease    DVT (deep venous thrombosis) (HCC) 11/13/2023   History of chicken pox    History of DVT of lower extremity    right   HIV (human immunodeficiency virus infection) (HCC)    Hyperlipidemia    Left hip pain 03/09/2013   Pneumonia    Protein malnutrition (HCC) 08/31/2015    Current Outpatient Medications on File Prior to Visit  Medication Sig Dispense Refill   ASPIRIN 81 PO Take 81 mg by mouth daily.     bictegravir-emtricitabine -tenofovir  AF (BIKTARVY ) 50-200-25 MG TABS tablet Take 1 tablet by mouth daily. 30 tablet 11   darunavir -cobicistat  ([name] ) 800-150 MG tablet TAKE 1 TABLET BY MOUTH EVERY DAY DO NOT CRUSH BREAK OR CHEW TABLETS TAKE WITH FOOD 30 tablet 11   Ensure (ENSURE) Take 1 Can by mouth 4 (four) times daily - after meals and at bedtime. 237 mL 11   gabapentin  (NEURONTIN ) 400 MG capsule Take 1 capsule (400 mg total) by mouth 4 (four) times daily. Please mail gabapentin  with crestor  and ARV to pt 360 capsule 3   rosuvastatin  (CRESTOR ) 10 MG tablet Take 1 tablet (10 mg total) by mouth daily. 30 tablet 11   warfarin (COUMADIN ) 5 MG tablet TAKE 1 AND 1/2 TABLETS BY MOUTH ON MONDAYS,  WEDNESDAYS, AND FRIDAYS.  ALL OTHER DAYS, TAKE ONLY 1 TABLET. 108 tablet 2   No current facility-administered medications on file prior to visit.    Family History  Problem Relation Age of Onset   Arthritis Mother    Kidney disease Maternal Grandfather     Social History   Socioeconomic History   Marital status: Widowed    Spouse name: Not on file   Number of children: 3   Years of education: Not on file   Highest education level: Not on file  Occupational History     Employer: UNEMPLOYED  Tobacco Use   Smoking status: Every Day    Current packs/day: 1.00    Average packs/day: 1 pack/day for 36.0 years (36.0 ttl pk-yrs)    Types: Cigarettes   Smokeless tobacco: Never   Tobacco comments:    1 pk a day   Vaping Use   Vaping status: Never Used  Substance and Sexual Activity   Alcohol use: No    Alcohol/week: 0.0 standard drinks of alcohol   Drug use: Not Currently    Frequency: 2.0 times per week    Types: Marijuana   Sexual activity: Not Currently    Comment: DECLINED CONDOMS  Other Topics Concern   Not on file  Social History Narrative   Not on file   Social Drivers of Health   Financial Resource Strain: Low Risk  (07/26/2023)   Overall Financial Resource Strain (CARDIA)    Difficulty of Paying Living Expenses: Not hard at all  Food Insecurity: No Food Insecurity (07/26/2023)   Hunger Vital Sign    Worried About Running Out of Food in the Last Year: Never true    Ran Out of Food in the Last Year: Never true  Transportation Needs: No Transportation Needs (07/26/2023)   PRAPARE - Administrator, Civil Service (Medical): No  Lack of Transportation (Non-Medical): No  Physical Activity: Inactive (07/26/2023)   Exercise Vital Sign    Days of Exercise per Week: 0 days    Minutes of Exercise per Session: 0 min  Stress: No Stress Concern Present (07/26/2023)   [employer] of Occupational Health - Occupational Stress Questionnaire    Feeling of Stress : Only a little  Social Connections: Socially Isolated (07/26/2023)   Social Connection and Isolation Panel [NHANES]    Frequency of Communication with Friends and Family: More than three times a week    Frequency of Social Gatherings with Friends and Family: Twice a week    Attends Religious Services: Never    Database administrator or Organizations: No    Attends Banker Meetings: Never    Marital Status: Widowed  Intimate Partner Violence: Not At Risk (07/26/2023)    Humiliation, Afraid, Rape, and Kick questionnaire    Fear of Current or Ex-Partner: No    Emotionally Abused: No    Physically Abused: No    Sexually Abused: No    Review of Systems: ROS negative except for what is noted on the assessment and plan.  Vitals:   12/10/23 0936  BP: (!) 118/56  Pulse: 100  Temp: 98.3 F (36.8 C)  TempSrc: Oral  SpO2: 100%  Weight: 120 lb 6.4 oz (54.6 kg)  Height: 5\' 11"  (1.803 m)    Physical Exam: Constitutional: well-appearing man , sitting in wheelchair , in no acute distress Cardiovascular: regular rate and rhythm, no m/r/g Pulmonary/Chest: normal work of breathing on room air, lungs clear to auscultation bilaterally Abdominal: soft, non-tender, non-distended MSK: BKA site clean dry and intact  Neurological: alert & oriented x 3, no focal deficit Skin: warm and dry Psych: normal mood and behavior  Assessment & Plan:   Hyperlipemia History of hyperlipidemia.  Last lipid panel a month ago showed an LDL of 176 and patient was initiated on Crestor  10 mg.  Patient denies any muscle weakness or pain since starting this medication.  I will recheck lipid panel in 2 months and adjust medication if needed at that time. -Continue Crestor  10 mg -Follow-up in 2 months for a lipid panel  Human immunodeficiency virus (HIV) disease (HCC) Patient with a history of HIV, was up with the ID clinic and is currently on Biktarvy .  Per chart review, patient is pretty consistent with his follow-ups with the ID clinic and endorse complete adherence to his HIV medications.     Status post above knee amputation (HCC) Stable status post AKA.  Stable in a wheelchair.  No concerns at this time  Hypercoagulable state [facility]) History of PAD and hypercoagulability, follows up with Coumadin  clinic with Dr. Hershell Lose.Also  has a history of DVT currently being managed on warfarin.Patient follows up with Dr Margit Shelling for his anticoagulation management. Patient's ID specialist Dr V  reached out to me concerning switching to a DOAC that will require less INR monitoring. I have relayed this information to Dr Margit Shelling and he will discuss this with the patient at their next visit. - Continue Warfarin  - F/U with Dr Margit Shelling  Phantom limb syndrome with pain Controlled and stable on gabapentin . No concerns at this time.   Long term use of opiate analgesic  On oxycodone  5 mg.  Expressed concerns of continuing running out of his pain medications.  States rules regarding prescription of controlled substance adequately discussed with the patient. Will prescribe scheduled oxycodone  for 90 days and schedule an  office visit at that time to assess his pain. - Prescribe oxycodone  5 mg for 3 months - Follow-up in 3 months   Patient discussed with Dr. Bettejane Brownie  Fay Hoop, M.D [facility] Internal Medicine Phone: [phone] Date 12/10/2023 Time 11:35 AM

## 2023-12-10 NOTE — Assessment & Plan Note (Signed)
 Patient with a history of HIV, was up with the ID clinic and is currently on Biktarvy .  Per chart review, patient is pretty consistent with his follow-ups with the ID clinic and endorse complete adherence to his HIV medications.

## 2023-12-10 NOTE — Addendum Note (Signed)
 Addended by: Kinda Pottle B on: 12/10/2023 11:52 AM   Modules accepted: Orders

## 2023-12-10 NOTE — Assessment & Plan Note (Signed)
 History of hyperlipidemia.  Last lipid panel a month ago showed an LDL of 176 and patient was initiated on Crestor  10 mg.  Patient denies any muscle weakness or pain since starting this medication.  I will recheck lipid panel in 2 months and adjust medication if needed at that time. -Continue Crestor  10 mg -Follow-up in 2 months for a lipid panel

## 2023-12-13 NOTE — Addendum Note (Signed)
Addended by: Kathleen Lime on: 12/13/2023 09:15 AM   Modules accepted: Level of Service

## 2023-12-13 NOTE — Progress Notes (Signed)
Internal Medicine Clinic Attending  Case discussed with the resident at the time of the visit.  We reviewed the resident's history and exam and pertinent patient test results.  I agree with the assessment, diagnosis, and plan of care documented in the resident's note.

## 2023-12-19 ENCOUNTER — Other Ambulatory Visit: Payer: Self-pay

## 2023-12-19 NOTE — Progress Notes (Signed)
Specialty Pharmacy Refill Coordination Note  Hoyte Ziebell is a 63 y.o. male contacted today regarding refills of specialty medication(s) Bictegravir-Emtricitab-Tenofov Musician); Darunavir-Cobicistat ([name])   Patient requested Delivery   Delivery date: 12/25/23   Verified address: [address]  [place] [address]   Medication will be filled on 12/24/23.

## 2023-12-19 NOTE — Progress Notes (Signed)
Specialty Pharmacy Ongoing Clinical Assessment Note  [name] is a 63 y.o. male who is being followed by the specialty pharmacy service for RxSp HIV   Patient's specialty medication(s) reviewed today: Bictegravir-Emtricitab-Tenofov (Biktarvy); Darunavir-Cobicistat ([name])   Missed doses in the last 4 weeks: 1   Patient/Caregiver did not have any additional questions or concerns.   Therapeutic benefit summary: Patient is achieving benefit   Adverse events/side effects summary: No adverse events/side effects   Patient's therapy is appropriate to: Continue    Goals Addressed             This Visit's Progress    Achieve Undetectable HIV Viral Load < 20       Patient is on track. Patient will maintain adherence. Viral load remains undetectable long term.          Follow up:  6 months  Otto Herb Specialty Pharmacist

## 2024-01-04 ENCOUNTER — Other Ambulatory Visit (HOSPITAL_COMMUNITY): Payer: Self-pay

## 2024-01-04 ENCOUNTER — Other Ambulatory Visit: Payer: Self-pay

## 2024-01-05 ENCOUNTER — Other Ambulatory Visit (HOSPITAL_COMMUNITY): Payer: Self-pay

## 2024-01-07 ENCOUNTER — Telehealth: Payer: Self-pay | Admitting: *Deleted

## 2024-01-07 ENCOUNTER — Other Ambulatory Visit: Payer: Self-pay

## 2024-01-07 ENCOUNTER — Other Ambulatory Visit (HOSPITAL_COMMUNITY): Payer: Self-pay

## 2024-01-07 NOTE — Telephone Encounter (Signed)
 Copied from CRM [id number]. Topic: General - Other >> Jan 04, 2024  3:28 PM Maxwell Marion wrote: Reason for CRM: Victorino Dike with [facility] called with patient on the call. Patient is requesting care giver services and Victorino Dike said the [phone] form needs to be completed. She said the form can be googled and printed out for the provider to fill out because patient does not have access to computer to print form and she mentioned she is not able to fax over form either because she does not have that capability. Form needs to be faxed to [phone]. Her direct contact information is [phone] ext [phone].

## 2024-01-09 ENCOUNTER — Other Ambulatory Visit (HOSPITAL_COMMUNITY): Payer: Self-pay

## 2024-01-09 ENCOUNTER — Telehealth: Payer: Self-pay | Admitting: *Deleted

## 2024-01-09 NOTE — Telephone Encounter (Signed)
 Thank you :)

## 2024-01-14 ENCOUNTER — Other Ambulatory Visit: Payer: Self-pay

## 2024-01-14 NOTE — Progress Notes (Signed)
 Specialty Pharmacy Refill Coordination Note  [name] is a 63 y.o. male contacted today regarding refills of specialty medication(s) Bictegravir-Emtricitab-Tenofov Musician); Darunavir-Cobicistat ([name])   Patient requested Delivery   Delivery date: 01/21/24   Verified address: [address]   [name] [id number]   Medication will be filled on 01/18/24.

## 2024-01-15 ENCOUNTER — Other Ambulatory Visit: Payer: Self-pay

## 2024-01-15 ENCOUNTER — Ambulatory Visit: Payer: Self-pay | Admitting: Student

## 2024-01-15 ENCOUNTER — Other Ambulatory Visit (HOSPITAL_COMMUNITY): Payer: Self-pay

## 2024-01-15 VITALS — BP 138/63 | HR 98 | Temp 98.0°F | Ht 71.0 in | Wt 117.6 lb

## 2024-01-15 DIAGNOSIS — J309 Allergic rhinitis, unspecified: Secondary | ICD-10-CM

## 2024-01-15 DIAGNOSIS — Z89611 Acquired absence of right leg above knee: Secondary | ICD-10-CM

## 2024-01-15 DIAGNOSIS — J3089 Other allergic rhinitis: Secondary | ICD-10-CM

## 2024-01-15 MED ORDER — FLUTICASONE PROPIONATE 50 MCG/ACT NA SUSP
1.0000 | Freq: Every day | NASAL | 2 refills | Status: DC
Start: 2024-01-15 — End: 2024-07-30
  Filled 2024-01-15: qty 16, 60d supply, fill #0
  Filled 2024-04-07: qty 16, 60d supply, fill #1
  Filled 2024-06-04: qty 16, 60d supply, fill #2

## 2024-01-15 NOTE — Assessment & Plan Note (Signed)
 Patient presents to discuss personal care services.  He has been having more more trouble completing his ADLs and IADLs.  For example, transferring from the bed to his wheelchair is very difficult for him, and bathing himself is also getting more more difficult as he is not able to bathe properly.  Fortunately, he has not had any falls but there have been a couple times where he almost did fall but was able to hold onto something.  He lives alone, and has no support in the area.  He has been eating frozen meals daily, as he is not able to cook for himself.  He does have a power rise to wheelchair which is his main use of mobility, and he has not done well with prosthetics in the past (however he is getting reevaluated for one today).  Overall, I think he would benefit greatly from personal care services, or an aide that stops by that helps with his medications, bathing, and ADLs.  Plan: - Referral placed for shower chair - Referral placed for personal care services

## 2024-01-15 NOTE — Assessment & Plan Note (Signed)
 Patient with long history of allergic rhinitis, recently exacerbated by move to different apartment.  He endorses significant nasal congestion, visible on exam.  No erythema in pharynx, lung auscultation clear, he denies any fevers or chills.  Overall, I think his symptoms are consistent with allergies and his main systems nasal congestion.  Will trial Flonase for him.

## 2024-01-15 NOTE — Patient Instructions (Signed)
 Thank you so much for coming to the clinic today!   I have sent in the prescription, as well as put in the orders for personal care services.    If you have any questions please feel free to the call the clinic at anytime at [phone]. It was a pleasure seeing you!  Best, Dr. Thomasene Ripple

## 2024-01-15 NOTE — Progress Notes (Signed)
 CC: PCS discussion  HPI:  [name] is a 63 y.o. male living with a history stated below and presents today for personal care service discussion. Please see problem based assessment and plan for additional details.  Past Medical History:  Diagnosis Date   Chronic kidney disease    DVT (deep venous thrombosis) (HCC) 11/13/2023   History of chicken pox    History of DVT of lower extremity    right   HIV (human immunodeficiency virus infection) (HCC)    Hyperlipidemia    Left hip pain 03/09/2013   Pneumonia    Protein malnutrition (HCC) 08/31/2015    Current Outpatient Medications on File Prior to Visit  Medication Sig Dispense Refill   ASPIRIN 81 PO Take 81 mg by mouth daily.     bictegravir-emtricitabine-tenofovir AF (BIKTARVY) 50-200-25 MG TABS tablet Take 1 tablet by mouth daily. 30 tablet 11   darunavir-cobicistat ([name]) 800-150 MG tablet TAKE 1 TABLET BY MOUTH EVERY DAY DO NOT CRUSH BREAK OR CHEW TABLETS TAKE WITH FOOD 30 tablet 11   Ensure (ENSURE) Take 1 Can by mouth 4 (four) times daily - after meals and at bedtime. 237 mL 11   gabapentin (NEURONTIN) 400 MG capsule Take 1 capsule (400 mg total) by mouth 4 (four) times daily. Please mail gabapentin with crestor and ARV to pt 360 capsule 3   oxyCODONE (OXY IR/ROXICODONE) 5 MG immediate release tablet Take 1 tablet (5 mg total) by mouth 2 (two) times daily as needed for severe pain (pain score 7-10). 60 tablet 0   [START ON 02/04/2024] oxyCODONE (OXY IR/ROXICODONE) 5 MG immediate release tablet Take 1 tablet (5 mg total) by mouth 2 (two) times daily as needed for severe pain (pain score 7-10). 60 tablet 0   [START ON 03/05/2024] oxyCODONE (OXY IR/ROXICODONE) 5 MG immediate release tablet Take 1 tablet (5 mg total) by mouth 2 (two) times daily as needed for severe pain (pain score 7-10). 60 tablet 0   rivaroxaban (XARELTO) 2.5 MG TABS tablet Take 1 tablet (2.5 mg total) by mouth 2 (two) times daily. 60 tablet 11    rosuvastatin (CRESTOR) 10 MG tablet Take 1 tablet (10 mg total) by mouth daily. 30 tablet 11   warfarin (COUMADIN) 5 MG tablet TAKE 1 AND 1/2 TABLETS BY MOUTH ON MONDAYS,  WEDNESDAYS, AND FRIDAYS.  ALL OTHER DAYS, TAKE ONLY 1 TABLET. 108 tablet 2   No current facility-administered medications on file prior to visit.    Family History  Problem Relation Age of Onset   Arthritis Mother    Kidney disease Maternal Grandfather     Social History   Socioeconomic History   Marital status: Widowed    Spouse name: Not on file   Number of children: 3   Years of education: Not on file   Highest education level: Not on file  Occupational History    Employer: UNEMPLOYED  Tobacco Use   Smoking status: Every Day    Current packs/day: 1.00    Average packs/day: 1 pack/day for 36.0 years (36.0 ttl pk-yrs)    Types: Cigarettes   Smokeless tobacco: Never   Tobacco comments:    1 pk a day   Vaping Use   Vaping status: Never Used  Substance and Sexual Activity   Alcohol use: No    Alcohol/week: 0.0 standard drinks of alcohol   Drug use: Not Currently    Frequency: 2.0 times per week    Types: Marijuana   Sexual activity:  Not Currently    Comment: DECLINED CONDOMS  Other Topics Concern   Not on file  Social History Narrative   Not on file   Social Drivers of Health   Financial Resource Strain: Low Risk  (07/26/2023)   Overall Financial Resource Strain (CARDIA)    Difficulty of Paying Living Expenses: Not hard at all  Food Insecurity: No Food Insecurity (07/26/2023)   Hunger Vital Sign    Worried About Running Out of Food in the Last Year: Never true    Ran Out of Food in the Last Year: Never true  Transportation Needs: No Transportation Needs (07/26/2023)   PRAPARE - Administrator, Civil Service (Medical): No    Lack of Transportation (Non-Medical): No  Physical Activity: Inactive (07/26/2023)   Exercise Vital Sign    Days of Exercise per Week: 0 days    Minutes of  Exercise per Session: 0 min  Stress: No Stress Concern Present (07/26/2023)   [employer] of Occupational Health - Occupational Stress Questionnaire    Feeling of Stress : Only a little  Social Connections: Socially Isolated (07/26/2023)   Social Connection and Isolation Panel [NHANES]    Frequency of Communication with Friends and Family: More than three times a week    Frequency of Social Gatherings with Friends and Family: Twice a week    Attends Religious Services: Never    Database administrator or Organizations: No    Attends Banker Meetings: Never    Marital Status: Widowed  Intimate Partner Violence: Not At Risk (07/26/2023)   Humiliation, Afraid, Rape, and Kick questionnaire    Fear of Current or Ex-Partner: No    Emotionally Abused: No    Physically Abused: No    Sexually Abused: No    Review of Systems: ROS negative except for what is noted on the assessment and plan.  Vitals:   01/15/24 0906  BP: 138/63  Pulse: 98  Temp: 98 F (36.7 C)  TempSrc: Oral  SpO2: 96%  Weight: 117 lb 9.6 oz (53.3 kg)  Height: 5\' 11"  (1.803 m)    Physical Exam: Constitutional: Chronically ill-appearing male, in wheelchair  HENT: normocephalic atraumatic, mucous membranes moist, clear discharge from nose, no erythema in pharynx Cardiovascular: regular rate and rhythm, no m/r/g Pulmonary/Chest: normal work of breathing on room air, lungs clear to auscultation bilaterally Abdominal: soft, non-tender, non-distended MSK: normal bulk and tone, R AKA   Assessment & Plan:   Status post above knee amputation [facility]) Patient presents to discuss personal care services.  He has been having more more trouble completing his ADLs and IADLs.  For example, transferring from the bed to his wheelchair is very difficult for him, and bathing himself is also getting more more difficult as he is not able to bathe properly.  Fortunately, he has not had any falls but there have been a couple  times where he almost did fall but was able to hold onto something.  He lives alone, and has no support in the area.  He has been eating frozen meals daily, as he is not able to cook for himself.  He does have a power rise to wheelchair which is his main use of mobility, and he has not done well with prosthetics in the past (however he is getting reevaluated for one today).  Overall, I think he would benefit greatly from personal care services, or an aide that stops by that helps with his medications, bathing, and  ADLs.  Plan: - Referral placed for shower chair - Referral placed for personal care services  Allergic rhinitis Patient with long history of allergic rhinitis, recently exacerbated by move to different apartment.  He endorses significant nasal congestion, visible on exam.  No erythema in pharynx, lung auscultation clear, he denies any fevers or chills.  Overall, I think his symptoms are consistent with allergies and his main systems nasal congestion.  Will trial Flonase for him.   Patient discussed with Dr. Cherrie Gauze, M.D. [facility] Internal Medicine, PGY-2 Pager: [phone] Date 01/15/2024 Time 9:58 AM

## 2024-01-15 NOTE — Progress Notes (Signed)
 Internal Medicine Clinic Attending  Case discussed with the resident at the time of the visit.  We reviewed the resident's history and exam and pertinent patient test results.  I agree with the assessment, diagnosis, and plan of care documented in the resident's note.

## 2024-01-17 ENCOUNTER — Telehealth: Payer: Self-pay | Admitting: *Deleted

## 2024-01-17 NOTE — Telephone Encounter (Signed)
 PCS form faxed via FAXCOM  to [phone] with confirmation.

## 2024-01-24 ENCOUNTER — Other Ambulatory Visit (HOSPITAL_COMMUNITY): Payer: Self-pay

## 2024-01-24 NOTE — Telephone Encounter (Signed)
 [name] PCS WAS FAXED VIA  FAXCOM 01-18-2024 @ 9:52 AM

## 2024-01-24 NOTE — Telephone Encounter (Signed)
 PCS form for [name] was faxed on 01-18-2024 sent via FAXCOM  9:52 am

## 2024-02-01 ENCOUNTER — Other Ambulatory Visit (HOSPITAL_COMMUNITY): Payer: Self-pay

## 2024-02-06 ENCOUNTER — Other Ambulatory Visit: Payer: Self-pay

## 2024-02-06 ENCOUNTER — Other Ambulatory Visit (HOSPITAL_COMMUNITY): Payer: Self-pay

## 2024-02-07 ENCOUNTER — Other Ambulatory Visit: Payer: Self-pay

## 2024-02-09 ENCOUNTER — Other Ambulatory Visit (HOSPITAL_COMMUNITY): Payer: Self-pay

## 2024-02-13 ENCOUNTER — Other Ambulatory Visit: Payer: Self-pay

## 2024-02-13 NOTE — Progress Notes (Signed)
 Specialty Pharmacy Refill Coordination Note  [name] is a 63 y.o. male contacted today regarding refills of specialty medication(s) Bictegravir-Emtricitab-Tenofov Musician); Darunavir-Cobicistat ([name])   Patient requested Delivery   Delivery date: 02/20/24   Verified address: [address]   [name] [address]   Medication will be filled on 04.22.25.

## 2024-02-19 ENCOUNTER — Other Ambulatory Visit: Payer: Self-pay

## 2024-02-19 ENCOUNTER — Telehealth: Payer: Self-pay | Admitting: *Deleted

## 2024-02-19 NOTE — Telephone Encounter (Signed)
 Left voice message for call back to [facility] regarding monthly hours for this patient, [facility] [phone].

## 2024-03-06 ENCOUNTER — Other Ambulatory Visit (HOSPITAL_COMMUNITY): Payer: Self-pay

## 2024-03-08 ENCOUNTER — Other Ambulatory Visit (HOSPITAL_COMMUNITY): Payer: Self-pay

## 2024-03-10 ENCOUNTER — Other Ambulatory Visit (HOSPITAL_COMMUNITY): Payer: Self-pay

## 2024-03-10 ENCOUNTER — Encounter: Admitting: Student

## 2024-03-11 NOTE — Progress Notes (Signed)
 The 10-year ASCVD risk score (Arnett DK, et al., 2019) is: 17.9%   Values used to calculate the score:     Age: 63 years     Sex: Male     Is Non-Hispanic African American: Yes     Diabetic: No     Tobacco smoker: Yes     Systolic Blood Pressure: 138 mmHg     Is BP treated: No     HDL Cholesterol: 49 mg/dL     Total Cholesterol: 246 mg/dL  Arlon Bergamo, BSN, RN

## 2024-03-14 ENCOUNTER — Other Ambulatory Visit: Payer: Self-pay

## 2024-03-14 ENCOUNTER — Other Ambulatory Visit: Payer: Self-pay | Admitting: Pharmacy Technician

## 2024-03-14 NOTE — Progress Notes (Signed)
 Specialty Pharmacy Refill Coordination Note  [name] is a 63 y.o. male contacted today regarding refills of specialty medication(s) Bictegravir-Emtricitab-Tenofov (Biktarvy ); Darunavir -Cobicistat  ([name] )   Patient requested (Patient-Rptd) Delivery   Delivery date: (Patient-Rptd) 03/14/24 New Delivery Date 03/17/24   Verified address: (Patient-Rptd) [address] [name] [address]   Medication will be filled on 03/14/24.   Called & spoke with patient about Delivery date & aware.

## 2024-03-17 ENCOUNTER — Telehealth: Payer: Self-pay

## 2024-03-17 DIAGNOSIS — R634 Abnormal weight loss: Secondary | ICD-10-CM

## 2024-03-17 MED ORDER — ENSURE PO LIQD: 1.0000 | Freq: Three times a day (TID) | ORAL | 11 refills | Status: AC

## 2024-03-17 NOTE — Telephone Encounter (Signed)
 Received VM from patient stating he needed Rx for Ensure. Called [name] back, he confirms he is working with [facility] with [facility].   Rx faxed to [facility].  Simrat Kendrick, BSN, RN

## 2024-03-25 ENCOUNTER — Ambulatory Visit (INDEPENDENT_AMBULATORY_CARE_PROVIDER_SITE_OTHER): Admitting: Student

## 2024-03-25 ENCOUNTER — Encounter: Payer: 59 | Admitting: Student

## 2024-03-25 ENCOUNTER — Other Ambulatory Visit (HOSPITAL_COMMUNITY): Payer: Self-pay

## 2024-03-25 ENCOUNTER — Encounter: Payer: Self-pay | Admitting: Student

## 2024-03-25 VITALS — BP 116/62 | HR 93 | Temp 98.2°F | Ht 71.0 in | Wt 114.2 lb

## 2024-03-25 DIAGNOSIS — B20 Human immunodeficiency virus [HIV] disease: Secondary | ICD-10-CM | POA: Diagnosis not present

## 2024-03-25 DIAGNOSIS — Z79891 Long term (current) use of opiate analgesic: Secondary | ICD-10-CM | POA: Diagnosis not present

## 2024-03-25 DIAGNOSIS — J309 Allergic rhinitis, unspecified: Secondary | ICD-10-CM | POA: Diagnosis not present

## 2024-03-25 DIAGNOSIS — I82409 Acute embolism and thrombosis of unspecified deep veins of unspecified lower extremity: Secondary | ICD-10-CM | POA: Diagnosis not present

## 2024-03-25 DIAGNOSIS — Z1211 Encounter for screening for malignant neoplasm of colon: Secondary | ICD-10-CM | POA: Insufficient documentation

## 2024-03-25 DIAGNOSIS — J301 Allergic rhinitis due to pollen: Secondary | ICD-10-CM

## 2024-03-25 MED ORDER — OXYCODONE HCL 5 MG PO TABS
5.0000 mg | ORAL_TABLET | Freq: Two times a day (BID) | ORAL | 0 refills | Status: DC | PRN
  Filled 2024-04-07: qty 60, 30d supply, fill #0

## 2024-03-25 MED ORDER — OXYCODONE HCL 5 MG PO TABS
5.0000 mg | ORAL_TABLET | Freq: Two times a day (BID) | ORAL | 0 refills | Status: DC | PRN
  Filled 2024-06-04 – 2024-06-09 (×2): qty 60, 30d supply, fill #0

## 2024-03-25 MED ORDER — OXYCODONE HCL 5 MG PO TABS: 5.0000 mg | ORAL_TABLET | Freq: Two times a day (BID) | ORAL | 0 refills | Status: DC | PRN

## 2024-03-25 NOTE — Assessment & Plan Note (Addendum)
 Previously patient had positive fecal occult blood test and recommended colonoscopy.  He however did not want a colonoscopy.  Today he states he wants to get another stool test.  He states that if this one is positive he will get a colonoscopy.  I counseled him on the importance of getting a colonoscopy as he already had one positive test.  He was adamant stating that he does not want a colonoscopy without another stool test.  Plan: - Cologuard test given - Patient recommended to get colonoscopy

## 2024-03-25 NOTE — Assessment & Plan Note (Addendum)
 Patient has a past medical history of HIV.  His current medications include Biktarvy  and [name] .  He reports that he sees [facility] regularly.  He has no concerns about this. Most recent CD4 count is 891 in January 2025. HIV viral load was undetectable at the time.  Plan: - Continue to follow with [facility] - Continue Biktarvy  - Continue [name] 

## 2024-03-25 NOTE — Assessment & Plan Note (Addendum)
 Patient has a past medical history of chronic pain secondary to AKA.  Patient was previously on morphine , and has been decreased to oxycodone .  He is currently on oxycodone  5 mg twice daily.  He states he has been on this for quite some time.  His doctor has been trying to wean him off.  He is not ready to wean down at this time, but I did bring the conversation up with him about weaning.  He was not ready for this.  Will continue to have these discussions.  Plan: - Prescribe oxycodone  5 mg twice daily for 3 months - Continue conversations with PCP for oxycodone  weaning

## 2024-03-25 NOTE — Assessment & Plan Note (Signed)
 Patient has a past medical history of DVTs.  He has been on warfarin in the past, and has been transition to Xarelto  2.5 mg twice daily.  He denies any concerns about this.  Plan: - Continue Xarelto  2.5 mg twice daily

## 2024-03-25 NOTE — Patient Instructions (Addendum)
 Graig, Hessling you for allowing me to take part in your care today.  Here are your instructions.  1. Please come back in 3 months.   2. I have refilled your medications.  3. Given your positive stool test in the past, we highly recommend you get a colonoscopy as there is a chance you could have colon cancer. If you decide you want to get a colonoscopy please call the clinic and we can send you for one.   Thank you, Dr. Lydia Sams  If you have any other questions please contact the internal medicine clinic at [phone] If it is after hours, please call the [facility] at [phone] and then ask the person who picks up for the resident on call.

## 2024-03-25 NOTE — Assessment & Plan Note (Signed)
 Patient reports that the Flonase  has been improving his symptoms.  He has no concerns.  Plan: - Continue Flonase  1 spray daily

## 2024-03-25 NOTE — Progress Notes (Signed)
 CC: Routine office visit   HPI:  [name] is a 63 y.o. male with a past medical history of prior DVT, PAD, GERD who presents for 12-month follow-up appointment.  Please see assessment and plan for full HPI.  Medications: HIV: Biktarvy  1 tablet daily, [name]  1 tablet daily Allergic rhinitis: Flonase  1 spray daily Neuropathy: Gabapentin  400 mg 4 times daily Chronic pain: Oxycodone  5 mg twice daily as needed DVT: Rivaroxaban  2.5 mg twice daily Hyperlipidemia: Crestor  10 mg daily   Past Medical History:  Diagnosis Date   Chronic kidney disease    DVT (deep venous thrombosis) (HCC) 11/13/2023   History of chicken pox    History of DVT of lower extremity    right   HIV (human immunodeficiency virus infection) (HCC)    Hyperlipidemia    Left hip pain 03/09/2013   Pneumonia    Protein malnutrition (HCC) 08/31/2015     Current Outpatient Medications:    ASPIRIN 81 PO, Take 81 mg by mouth daily., Disp: , Rfl:    bictegravir-emtricitabine -tenofovir  AF (BIKTARVY ) 50-200-25 MG TABS tablet, Take 1 tablet by mouth daily., Disp: 30 tablet, Rfl: 11   darunavir -cobicistat  ([name] ) 800-150 MG tablet, TAKE 1 TABLET BY MOUTH EVERY DAY DO NOT CRUSH BREAK OR CHEW TABLETS TAKE WITH FOOD, Disp: 30 tablet, Rfl: 11   Ensure (ENSURE), Take 1 Can by mouth 4 (four) times daily - after meals and at bedtime., Disp: 237 mL, Rfl: 11   fluticasone  (FLONASE ) 50 MCG/ACT nasal spray, Place 1 spray into both nostrils daily., Disp: 16 g, Rfl: 2   gabapentin  (NEURONTIN ) 400 MG capsule, Take 1 capsule (400 mg total) by mouth 4 (four) times daily. Please mail gabapentin  with crestor  and ARV to pt, Disp: 360 capsule, Rfl: 3   [START ON 06/06/2024] oxyCODONE  (OXY IR/ROXICODONE ) 5 MG immediate release tablet, Take 1 tablet (5 mg total) by mouth 2 (two) times daily as needed for severe pain (pain score 7-10)., Disp: 60 tablet, Rfl: 0   [START ON 05/06/2024] oxyCODONE  (OXY IR/ROXICODONE ) 5 MG immediate release  tablet, Take 1 tablet (5 mg total) by mouth 2 (two) times daily as needed for severe pain (pain score 7-10)., Disp: 60 tablet, Rfl: 0   [START ON 04/06/2024] oxyCODONE  (OXY IR/ROXICODONE ) 5 MG immediate release tablet, Take 1 tablet (5 mg total) by mouth 2 (two) times daily as needed for severe pain (pain score 7-10)., Disp: 60 tablet, Rfl: 0   rivaroxaban  (XARELTO ) 2.5 MG TABS tablet, Take 1 tablet (2.5 mg total) by mouth 2 (two) times daily., Disp: 60 tablet, Rfl: 11   rosuvastatin  (CRESTOR ) 10 MG tablet, Take 1 tablet (10 mg total) by mouth daily., Disp: 30 tablet, Rfl: 11  Review of Systems:    Negative except for what is stated in HPI  Physical Exam:  Vitals:   03/25/24 1002 03/25/24 1009  BP: (!) 147/51 116/62  Pulse: 93 93  Temp: 98.2 F (36.8 C)   TempSrc: Oral   SpO2: 98%   Weight: 114 lb 3.2 oz (51.8 kg)   Height: 5\' 11"  (1.803 m)    General: Patient is sitting comfortably in the room  Head: Normocephalic, atraumatic  Cardio: Regular rate and rhythm, no murmurs, rubs or gallops Pulmonary: Clear to ausculation bilaterally with no rales, rhonchi, and crackles   Assessment & Plan:   Long term current use of opiate analgesic Patient has a past medical history of chronic pain secondary to AKA.  Patient was previously on morphine , and  has been decreased to oxycodone .  He is currently on oxycodone  5 mg twice daily.  He states he has been on this for quite some time.  His doctor has been trying to wean him off.  He is not ready to wean down at this time, but I did bring the conversation up with him about weaning.  He was not ready for this.  Will continue to have these discussions.  Plan: - Prescribe oxycodone  5 mg twice daily for 3 months - Continue conversations with PCP for oxycodone  weaning  DVT (deep venous thrombosis) (HCC) Patient has a past medical history of DVTs.  He has been on warfarin in the past, and has been transition to Xarelto  2.5 mg twice daily.  He denies any  concerns about this.  Plan: - Continue Xarelto  2.5 mg twice daily  Allergic rhinitis Patient reports that the Flonase  has been improving his symptoms.  He has no concerns.  Plan: - Continue Flonase  1 spray daily  Human immunodeficiency virus (HIV) disease (HCC) Patient has a past medical history of HIV.  His current medications include Biktarvy  and [name] .  He reports that he sees [facility] regularly.  He has no concerns about this. Most recent CD4 count is 891 in January 2025. HIV viral load was undetectable at the time.  Plan: - Continue to follow with [facility] - Continue Biktarvy  - Continue [name]   Colon cancer screening Previously patient had positive fecal occult blood test and recommended colonoscopy.  He however did not want a colonoscopy.  Today he states he wants to get another stool test.  He states that if this one is positive he will get a colonoscopy.  I counseled him on the importance of getting a colonoscopy as he already had one positive test.  He was adamant stating that he does not want a colonoscopy without another stool test.  Plan: - Cologuard test given - Patient recommended to get colonoscopy  Patient discussed with Dr. Teola Felling, DO PGY-2 Internal Medicine Resident

## 2024-03-31 NOTE — Progress Notes (Signed)
 Internal Medicine Clinic Attending  Case discussed with the resident at the time of the visit.  We reviewed the resident's history and exam and pertinent patient test results.  I agree with the assessment, diagnosis, and plan of care documented in the resident's note.

## 2024-04-03 DIAGNOSIS — Z1211 Encounter for screening for malignant neoplasm of colon: Secondary | ICD-10-CM | POA: Diagnosis not present

## 2024-04-07 ENCOUNTER — Other Ambulatory Visit (HOSPITAL_COMMUNITY): Payer: Self-pay

## 2024-04-07 ENCOUNTER — Other Ambulatory Visit: Payer: Self-pay

## 2024-04-13 LAB — COLOGUARD: COLOGUARD: POSITIVE — AB

## 2024-04-14 ENCOUNTER — Other Ambulatory Visit: Payer: Self-pay

## 2024-04-14 ENCOUNTER — Ambulatory Visit: Payer: Self-pay | Admitting: Student

## 2024-04-14 DIAGNOSIS — R195 Other fecal abnormalities: Secondary | ICD-10-CM

## 2024-04-16 ENCOUNTER — Other Ambulatory Visit: Payer: Self-pay | Admitting: Pharmacy Technician

## 2024-04-16 ENCOUNTER — Other Ambulatory Visit: Payer: Self-pay

## 2024-04-16 NOTE — Progress Notes (Signed)
 Specialty Pharmacy Refill Coordination Note  [name] is a 63 y.o. male contacted today regarding refills of specialty medication(s) Bictegravir-Emtricitab-Tenofov (Biktarvy ); Darunavir -Cobicistat  ([name] )   Patient requested Delivery   Delivery date: 04/17/24   Verified address: [address] [place], [place] [address]   Medication will be filled on 04/16/24.

## 2024-04-30 ENCOUNTER — Other Ambulatory Visit: Payer: Self-pay

## 2024-05-05 ENCOUNTER — Other Ambulatory Visit: Payer: Self-pay

## 2024-05-05 ENCOUNTER — Other Ambulatory Visit (HOSPITAL_COMMUNITY): Payer: Self-pay

## 2024-05-05 ENCOUNTER — Other Ambulatory Visit: Payer: Self-pay | Admitting: Student

## 2024-05-06 ENCOUNTER — Other Ambulatory Visit (HOSPITAL_COMMUNITY): Payer: Self-pay

## 2024-05-06 ENCOUNTER — Encounter (HOSPITAL_COMMUNITY): Payer: Self-pay

## 2024-05-07 ENCOUNTER — Encounter: Payer: Self-pay | Admitting: Pharmacist

## 2024-05-07 ENCOUNTER — Other Ambulatory Visit (HOSPITAL_COMMUNITY): Payer: Self-pay

## 2024-05-07 ENCOUNTER — Other Ambulatory Visit: Payer: Self-pay

## 2024-05-07 MED ORDER — OXYCODONE HCL 5 MG PO TABS
5.0000 mg | ORAL_TABLET | Freq: Two times a day (BID) | ORAL | 0 refills | Status: DC | PRN
  Filled 2024-05-07 – 2024-05-12 (×2): qty 60, 30d supply, fill #0

## 2024-05-08 ENCOUNTER — Other Ambulatory Visit: Payer: Self-pay

## 2024-05-09 ENCOUNTER — Other Ambulatory Visit: Payer: Self-pay

## 2024-05-09 NOTE — Progress Notes (Signed)
 Specialty Pharmacy Refill Coordination Note  [name] is a 63 y.o. male contacted today regarding refills of specialty medication(s) Bictegravir-Emtricitab-Tenofov (Biktarvy ); Darunavir -Cobicistat  ([name] )   Patient requested Delivery   Delivery date: 05/13/24   Verified address: [address] [place],  [address]   Medication will be filled on 05/12/24.

## 2024-05-12 ENCOUNTER — Other Ambulatory Visit (HOSPITAL_COMMUNITY): Payer: Self-pay

## 2024-05-12 ENCOUNTER — Other Ambulatory Visit: Payer: Self-pay

## 2024-05-12 NOTE — Progress Notes (Unsigned)
 Chief complaint: follow-up for HIV disease on medications   Subjective:    Patient ID: [name], male    DOB: [birth year], 63 y.o.   MRN: [id number]  HPI  Past Medical History:  Diagnosis Date   Chronic kidney disease    DVT (deep venous thrombosis) (HCC) 11/13/2023   History of chicken pox    History of DVT of lower extremity    right   HIV (human immunodeficiency virus infection) (HCC)    Hyperlipidemia    Left hip pain 03/09/2013   Pneumonia    Protein malnutrition (HCC) 08/31/2015    Past Surgical History:  Procedure Laterality Date   APPENDECTOMY  1962   LEG AMPUTATION ABOVE KNEE  2010   right leg    Family History  Problem Relation Age of Onset   Arthritis Mother    Kidney disease Maternal Grandfather       Social History   Socioeconomic History   Marital status: Widowed    Spouse name: Not on file   Number of children: 3   Years of education: Not on file   Highest education level: Not on file  Occupational History    Employer: UNEMPLOYED  Tobacco Use   Smoking status: Every Day    Current packs/day: 1.00    Average packs/day: 1 pack/day for 36.0 years (36.0 ttl pk-yrs)    Types: Cigarettes   Smokeless tobacco: Never   Tobacco comments:    1 pk a day   Vaping Use   Vaping status: Never Used  Substance and Sexual Activity   Alcohol use: No    Alcohol/week: 0.0 standard drinks of alcohol   Drug use: Not Currently    Frequency: 2.0 times per week    Types: Marijuana   Sexual activity: Not Currently    Comment: DECLINED CONDOMS  Other Topics Concern   Not on file  Social History Narrative   Not on file   Social Drivers of Health   Financial Resource Strain: Low Risk  (07/26/2023)   Overall Financial Resource Strain (CARDIA)    Difficulty of Paying Living Expenses: Not hard at all  Food Insecurity: No Food Insecurity (07/26/2023)   Hunger Vital Sign    Worried About Running Out of Food in the Last Year: Never true    Ran Out of Food in  the Last Year: Never true  Transportation Needs: No Transportation Needs (07/26/2023)   PRAPARE - Administrator, Civil Service (Medical): No    Lack of Transportation (Non-Medical): No  Physical Activity: Inactive (07/26/2023)   Exercise Vital Sign    Days of Exercise per Week: 0 days    Minutes of Exercise per Session: 0 min  Stress: No Stress Concern Present (07/26/2023)   [employer] of Occupational Health - Occupational Stress Questionnaire    Feeling of Stress : Only a little  Social Connections: Socially Isolated (07/26/2023)   Social Connection and Isolation Panel    Frequency of Communication with Friends and Family: More than three times a week    Frequency of Social Gatherings with Friends and Family: Twice a week    Attends Religious Services: Never    Database administrator or Organizations: No    Attends Banker Meetings: Never    Marital Status: Widowed    Allergies  Allergen Reactions   Dapsone     REACTION: fever   Megestrol     Sulfa Antibiotics Other (See Comments)    put's  me down bad   Sulfamethoxazole-Trimethoprim     REACTION: fever     Current Outpatient Medications:    ASPIRIN 81 PO, Take 81 mg by mouth daily., Disp: , Rfl:    bictegravir-emtricitabine -tenofovir  AF (BIKTARVY ) 50-200-25 MG TABS tablet, Take 1 tablet by mouth daily., Disp: 30 tablet, Rfl: 11   darunavir -cobicistat  ([name] ) 800-150 MG tablet, TAKE 1 TABLET BY MOUTH EVERY DAY DO NOT CRUSH BREAK OR CHEW TABLETS TAKE WITH FOOD, Disp: 30 tablet, Rfl: 11   Ensure (ENSURE), Take 1 Can by mouth 4 (four) times daily - after meals and at bedtime., Disp: 237 mL, Rfl: 11   fluticasone  (FLONASE ) 50 MCG/ACT nasal spray, Place 1 spray into both nostrils daily., Disp: 16 g, Rfl: 2   gabapentin  (NEURONTIN ) 400 MG capsule, Take 1 capsule (400 mg total) by mouth 4 (four) times daily. Please mail gabapentin  with crestor  and ARV to pt, Disp: 360 capsule, Rfl: 3   [START ON  06/06/2024] oxyCODONE  (OXY IR/ROXICODONE ) 5 MG immediate release tablet, Take 1 tablet (5 mg total) by mouth 2 (two) times daily as needed for severe pain (pain score 7-10)., Disp: 60 tablet, Rfl: 0   oxyCODONE  (OXY IR/ROXICODONE ) 5 MG immediate release tablet, Take 1 tablet (5 mg total) by mouth 2 (two) times daily as needed for severe pain (pain score 7-10)., Disp: 60 tablet, Rfl: 0   oxyCODONE  (OXY IR/ROXICODONE ) 5 MG immediate release tablet, Take 1 tablet (5 mg total) by mouth 2 (two) times daily as needed for severe pain (pain score 7-10)., Disp: 60 tablet, Rfl: 0   rivaroxaban  (XARELTO ) 2.5 MG TABS tablet, Take 1 tablet (2.5 mg total) by mouth 2 (two) times daily., Disp: 60 tablet, Rfl: 11   rosuvastatin  (CRESTOR ) 10 MG tablet, Take 1 tablet (10 mg total) by mouth daily., Disp: 30 tablet, Rfl: 11   Past Medical History:  Diagnosis Date   Chronic kidney disease    DVT (deep venous thrombosis) (HCC) 11/13/2023   History of chicken pox    History of DVT of lower extremity    right   HIV (human immunodeficiency virus infection) (HCC)    Hyperlipidemia    Left hip pain 03/09/2013   Pneumonia    Protein malnutrition (HCC) 08/31/2015    Past Surgical History:  Procedure Laterality Date   APPENDECTOMY  1962   LEG AMPUTATION ABOVE KNEE  2010   right leg    Family History  Problem Relation Age of Onset   Arthritis Mother    Kidney disease Maternal Grandfather       Social History   Socioeconomic History   Marital status: Widowed    Spouse name: Not on file   Number of children: 3   Years of education: Not on file   Highest education level: Not on file  Occupational History    Employer: UNEMPLOYED  Tobacco Use   Smoking status: Every Day    Current packs/day: 1.00    Average packs/day: 1 pack/day for 36.0 years (36.0 ttl pk-yrs)    Types: Cigarettes   Smokeless tobacco: Never   Tobacco comments:    1 pk a day   Vaping Use   Vaping status: Never Used  Substance and  Sexual Activity   Alcohol use: No    Alcohol/week: 0.0 standard drinks of alcohol   Drug use: Not Currently    Frequency: 2.0 times per week    Types: Marijuana   Sexual activity: Not Currently    Comment: DECLINED CONDOMS  Other Topics Concern   Not on file  Social History Narrative   Not on file   Social Drivers of Health   Financial Resource Strain: Low Risk  (07/26/2023)   Overall Financial Resource Strain (CARDIA)    Difficulty of Paying Living Expenses: Not hard at all  Food Insecurity: No Food Insecurity (07/26/2023)   Hunger Vital Sign    Worried About Running Out of Food in the Last Year: Never true    Ran Out of Food in the Last Year: Never true  Transportation Needs: No Transportation Needs (07/26/2023)   PRAPARE - Administrator, Civil Service (Medical): No    Lack of Transportation (Non-Medical): No  Physical Activity: Inactive (07/26/2023)   Exercise Vital Sign    Days of Exercise per Week: 0 days    Minutes of Exercise per Session: 0 min  Stress: No Stress Concern Present (07/26/2023)   [employer] of Occupational Health - Occupational Stress Questionnaire    Feeling of Stress : Only a little  Social Connections: Socially Isolated (07/26/2023)   Social Connection and Isolation Panel    Frequency of Communication with Friends and Family: More than three times a week    Frequency of Social Gatherings with Friends and Family: Twice a week    Attends Religious Services: Never    Database administrator or Organizations: No    Attends Banker Meetings: Never    Marital Status: Widowed    Allergies  Allergen Reactions   Dapsone     REACTION: fever   Megestrol     Sulfa Antibiotics Other (See Comments)    put's me down bad   Sulfamethoxazole-Trimethoprim     REACTION: fever     Current Outpatient Medications:    ASPIRIN 81 PO, Take 81 mg by mouth daily., Disp: , Rfl:    bictegravir-emtricitabine -tenofovir  AF (BIKTARVY ) 50-200-25  MG TABS tablet, Take 1 tablet by mouth daily., Disp: 30 tablet, Rfl: 11   darunavir -cobicistat  ([name] ) 800-150 MG tablet, TAKE 1 TABLET BY MOUTH EVERY DAY DO NOT CRUSH BREAK OR CHEW TABLETS TAKE WITH FOOD, Disp: 30 tablet, Rfl: 11   Ensure (ENSURE), Take 1 Can by mouth 4 (four) times daily - after meals and at bedtime., Disp: 237 mL, Rfl: 11   fluticasone  (FLONASE ) 50 MCG/ACT nasal spray, Place 1 spray into both nostrils daily., Disp: 16 g, Rfl: 2   gabapentin  (NEURONTIN ) 400 MG capsule, Take 1 capsule (400 mg total) by mouth 4 (four) times daily. Please mail gabapentin  with crestor  and ARV to pt, Disp: 360 capsule, Rfl: 3   [START ON 06/06/2024] oxyCODONE  (OXY IR/ROXICODONE ) 5 MG immediate release tablet, Take 1 tablet (5 mg total) by mouth 2 (two) times daily as needed for severe pain (pain score 7-10)., Disp: 60 tablet, Rfl: 0   oxyCODONE  (OXY IR/ROXICODONE ) 5 MG immediate release tablet, Take 1 tablet (5 mg total) by mouth 2 (two) times daily as needed for severe pain (pain score 7-10)., Disp: 60 tablet, Rfl: 0   oxyCODONE  (OXY IR/ROXICODONE ) 5 MG immediate release tablet, Take 1 tablet (5 mg total) by mouth 2 (two) times daily as needed for severe pain (pain score 7-10)., Disp: 60 tablet, Rfl: 0   rivaroxaban  (XARELTO ) 2.5 MG TABS tablet, Take 1 tablet (2.5 mg total) by mouth 2 (two) times daily., Disp: 60 tablet, Rfl: 11   rosuvastatin  (CRESTOR ) 10 MG tablet, Take 1 tablet (10 mg total) by mouth daily., Disp: 30 tablet, Rfl: 11  Review of Systems     Objective:   Physical Exam        Assessment & Plan:

## 2024-05-13 ENCOUNTER — Other Ambulatory Visit: Payer: Self-pay

## 2024-05-13 ENCOUNTER — Encounter: Payer: Self-pay | Admitting: Infectious Disease

## 2024-05-13 ENCOUNTER — Ambulatory Visit: Payer: 59 | Admitting: Infectious Disease

## 2024-05-13 ENCOUNTER — Other Ambulatory Visit (HOSPITAL_COMMUNITY): Payer: Self-pay

## 2024-05-13 VITALS — BP 122/67 | HR 80 | Temp 98.1°F | Ht 70.0 in

## 2024-05-13 DIAGNOSIS — R52 Pain, unspecified: Secondary | ICD-10-CM

## 2024-05-13 DIAGNOSIS — I824Z9 Acute embolism and thrombosis of unspecified deep veins of unspecified distal lower extremity: Secondary | ICD-10-CM | POA: Diagnosis not present

## 2024-05-13 DIAGNOSIS — B20 Human immunodeficiency virus [HIV] disease: Secondary | ICD-10-CM | POA: Diagnosis not present

## 2024-05-13 DIAGNOSIS — E785 Hyperlipidemia, unspecified: Secondary | ICD-10-CM | POA: Diagnosis not present

## 2024-05-13 DIAGNOSIS — Z79899 Other long term (current) drug therapy: Secondary | ICD-10-CM | POA: Diagnosis not present

## 2024-05-13 MED ORDER — PREZCOBIX 800-150 MG PO TABS
ORAL_TABLET | ORAL | 11 refills | Status: AC
  Filled 2024-05-13: qty 30, fill #0
  Filled 2024-06-06: qty 30, 30d supply, fill #0
  Filled 2024-07-07: qty 30, 30d supply, fill #1
  Filled 2024-08-08: qty 30, 30d supply, fill #2
  Filled 2024-09-02: qty 30, 30d supply, fill #3
  Filled 2024-09-24 – 2024-09-26 (×2): qty 30, 30d supply, fill #4
  Filled 2024-11-05: qty 30, 30d supply, fill #5
  Filled 2024-11-28: qty 30, 30d supply, fill #6

## 2024-05-13 MED ORDER — BIKTARVY 50-200-25 MG PO TABS
1.0000 | ORAL_TABLET | Freq: Every day | ORAL | 11 refills | Status: AC
  Filled 2024-05-13 – 2024-06-06 (×2): qty 30, 30d supply, fill #0
  Filled 2024-07-07: qty 30, 30d supply, fill #1
  Filled 2024-08-08: qty 30, 30d supply, fill #2
  Filled 2024-09-02: qty 30, 30d supply, fill #3
  Filled 2024-09-24 – 2024-09-26 (×2): qty 30, 30d supply, fill #4
  Filled 2024-11-05: qty 30, 30d supply, fill #5
  Filled 2024-11-28: qty 30, 30d supply, fill #6

## 2024-05-13 NOTE — Patient Instructions (Signed)
 Smoking Cessation: QuitlineNC 1-800-QUIT-NOW 707-701-6721); Espaol: 1-855-Djelo-Ya (1-780-445-4976) http://carroll-castaneda.info/

## 2024-05-14 LAB — T-HELPER CELLS (CD4) COUNT (NOT AT ARMC)
CD4 % Helper T Cell: 25 % — ABNORMAL LOW (ref 33–65)
CD4 T Cell Abs: 779 /uL (ref 400–1790)

## 2024-05-15 LAB — COMPLETE METABOLIC PANEL WITHOUT GFR
AG Ratio: 1.5 (calc) (ref 1.0–2.5)
ALT: 17 U/L (ref 9–46)
AST: 20 U/L (ref 10–35)
Albumin: 4.4 g/dL (ref 3.6–5.1)
Alkaline phosphatase (APISO): 49 U/L (ref 35–144)
BUN: 13 mg/dL (ref 7–25)
CO2: 29 mmol/L (ref 20–32)
Calcium: 9.5 mg/dL (ref 8.6–10.3)
Chloride: 101 mmol/L (ref 98–110)
Creat: 1.31 mg/dL (ref 0.70–1.35)
Globulin: 2.9 g/dL (ref 1.9–3.7)
Glucose, Bld: 99 mg/dL (ref 65–99)
Potassium: 4 mmol/L (ref 3.5–5.3)
Sodium: 138 mmol/L (ref 135–146)
Total Bilirubin: 1 mg/dL (ref 0.2–1.2)
Total Protein: 7.3 g/dL (ref 6.1–8.1)

## 2024-05-15 LAB — HIV-1 RNA QUANT-NO REFLEX-BLD
HIV 1 RNA Quant: NOT DETECTED {copies}/mL
HIV-1 RNA Quant, Log: NOT DETECTED {Log_copies}/mL

## 2024-05-15 LAB — CBC WITH DIFFERENTIAL/PLATELET
Absolute Lymphocytes: 3546 {cells}/uL (ref 850–3900)
Absolute Monocytes: 352 {cells}/uL (ref 200–950)
Basophils Absolute: 19 {cells}/uL (ref 0–200)
Basophils Relative: 0.3 %
Eosinophils Absolute: 83 {cells}/uL (ref 15–500)
Eosinophils Relative: 1.3 %
HCT: 49.3 % (ref 38.5–50.0)
Hemoglobin: 16.8 g/dL (ref 13.2–17.1)
MCH: 33.7 pg — ABNORMAL HIGH (ref 27.0–33.0)
MCHC: 34.1 g/dL (ref 32.0–36.0)
MCV: 99 fL (ref 80.0–100.0)
MPV: 9.8 fL (ref 7.5–12.5)
Monocytes Relative: 5.5 %
Neutro Abs: 2400 {cells}/uL (ref 1500–7800)
Neutrophils Relative %: 37.5 %
Platelets: 237 Thousand/uL (ref 140–400)
RBC: 4.98 Million/uL (ref 4.20–5.80)
RDW: 13.9 % (ref 11.0–15.0)
Total Lymphocyte: 55.4 %
WBC: 6.4 Thousand/uL (ref 3.8–10.8)

## 2024-05-15 LAB — LIPID PANEL
Cholesterol: 162 mg/dL (ref <200)
HDL: 56 mg/dL (ref >=40)
LDL Cholesterol (Calc): 85 mg/dL
Non-HDL Cholesterol (Calc): 106 mg/dL (ref <130)
Total CHOL/HDL Ratio: 2.9 (calc) (ref <5.0)
Triglycerides: 115 mg/dL (ref <150)

## 2024-05-15 LAB — RPR: RPR Ser Ql: NONREACTIVE

## 2024-05-20 ENCOUNTER — Encounter: Payer: Self-pay | Admitting: Gastroenterology

## 2024-05-30 ENCOUNTER — Other Ambulatory Visit: Payer: Self-pay

## 2024-06-04 ENCOUNTER — Other Ambulatory Visit (HOSPITAL_COMMUNITY): Payer: Self-pay

## 2024-06-04 ENCOUNTER — Other Ambulatory Visit: Payer: Self-pay

## 2024-06-05 ENCOUNTER — Other Ambulatory Visit (HOSPITAL_COMMUNITY): Payer: Self-pay

## 2024-06-05 ENCOUNTER — Other Ambulatory Visit: Payer: Self-pay

## 2024-06-06 ENCOUNTER — Other Ambulatory Visit: Payer: Self-pay

## 2024-06-06 NOTE — Progress Notes (Signed)
 Specialty Pharmacy Refill Coordination Note  [name] is a 63 y.o. male contacted today regarding refills of specialty medication(s) Bictegravir-Emtricitab-Tenofov (Biktarvy ); Darunavir -Cobicistat  ([name] )   Patient requested Delivery   Delivery date: 06/09/24   Verified address: [address] [place], [place] [address]   Medication will be filled on 06/06/24.

## 2024-06-09 ENCOUNTER — Other Ambulatory Visit (HOSPITAL_COMMUNITY): Payer: Self-pay

## 2024-06-23 ENCOUNTER — Other Ambulatory Visit: Payer: Self-pay

## 2024-06-23 ENCOUNTER — Ambulatory Visit: Payer: Self-pay | Admitting: Student

## 2024-06-23 VITALS — BP 126/46 | HR 73 | Temp 98.2°F | Ht 71.0 in | Wt 115.0 lb

## 2024-06-23 DIAGNOSIS — Z79891 Long term (current) use of opiate analgesic: Secondary | ICD-10-CM | POA: Diagnosis not present

## 2024-06-23 DIAGNOSIS — Z1211 Encounter for screening for malignant neoplasm of colon: Secondary | ICD-10-CM

## 2024-06-23 DIAGNOSIS — Z89611 Acquired absence of right leg above knee: Secondary | ICD-10-CM

## 2024-06-23 DIAGNOSIS — G546 Phantom limb syndrome with pain: Secondary | ICD-10-CM

## 2024-06-23 MED ORDER — OXYCODONE HCL 5 MG PO TABS: 5.0000 mg | ORAL_TABLET | Freq: Two times a day (BID) | ORAL | 0 refills | Status: DC | PRN

## 2024-06-23 NOTE — Assessment & Plan Note (Signed)
 Recent positive fecal occult blood test this year.  He continues to take Eliquis  daily for history of DVTs.  Although he has reported weight loss, he does not have fevers or chills.  He understands that he now needs a colonoscopy.  He has an appointment with gastroenterology in September of this year. - He will see gastroenterology in September

## 2024-06-23 NOTE — Assessment & Plan Note (Signed)
 Presents today requesting a referral to the [facility] for resizing of his right leg prosthesis.  He has a history of right AKA.  Shares that he has lost weight and as a result the prosthesis is too loose. - Referral to [facility] placed

## 2024-06-23 NOTE — Assessment & Plan Note (Signed)
 Oxycodone  as part of his pain control regimen for chronic pain related to his knee amputation.  In addition he takes gabapentin  for this.  States pain is well-controlled.  Denies constipation.  Takes the medicine regularly - Refill oxycodone  5 twice daily as needed. - New pain contract completed today - UDS at next visit in 3 months

## 2024-06-23 NOTE — Progress Notes (Signed)
   CC: Med refill (oxycodone ), and referral to [facility] for RLE prosthesis  HPI:  [name] is a 63 y.o. male with a PMH stated below who presents today for the above issues. He has no acute concerns.  Please see problem based assessment and plan for additional details.  Past Medical History:  Diagnosis Date   Chronic kidney disease    DVT (deep venous thrombosis) (HCC) 11/13/2023   History of chicken pox    History of DVT of lower extremity    right   HIV (human immunodeficiency virus infection) (HCC)    Hyperlipidemia    Left hip pain 03/09/2013   Pneumonia    Protein malnutrition (HCC) 08/31/2015    Review of Systems: ROS negative except for what is noted on the assessment and plan.  Vitals:   06/23/24 0947  BP: (!) 126/46  Pulse: 73  Temp: 98.2 F (36.8 C)  TempSrc: Oral  SpO2: 98%  Weight: 115 lb (52.2 kg)  Height: 5' 11 (1.803 m)   Physical Exam: Constitutional: well-appearing man in no acute distress. He uses a motorized wheelchair and has hearing aids. HENT: normocephalic atraumatic Eyes: conjunctiva non-erythematous Cardiovascular: regular rate and rhythm, no m/r/g Pulmonary/Chest: normal work of breathing on room air, lungs clear to auscultation bilaterally Abdominal: soft, non-tender, non-distended Neurological: alert & oriented x 3, no focal deficit Skin: warm and dry Psych: normal mood and behavior  Assessment & Plan:   Patient discussed with Dr. Lovie  Status post above knee amputation [facility]) Presents today requesting a referral to the [facility] clinic for resizing of his right leg prosthesis.  He has a history of right AKA.  Shares that he has lost weight and as a result the prosthesis is too loose. - Referral to [facility] placed  Long term current use of opiate analgesic Oxycodone  as part of his pain control regimen for chronic pain related to his knee amputation.  In addition he takes gabapentin  for this.  States pain is  well-controlled.  Denies constipation.  Takes the medicine regularly - Refill oxycodone  5 twice daily as needed. - New pain contract completed today - UDS at next visit in 3 months  Phantom limb syndrome with pain Ongoing phantom pain for which she also takes gabapentin .  He is very satisfied with its effect.  Alertness is not negatively impacted. - Continue gabapentin  400 4 times daily  Colon cancer screening Recent positive fecal occult blood test this year.  He continues to take Eliquis  daily for history of DVTs.  Although he has reported weight loss, he does not have fevers or chills.  He understands that he now needs a colonoscopy.  He has an appointment with gastroenterology in September of this year. - He will see gastroenterology in September  RTC in 3 months for pain medicine refill. Consider UDS at that time. Please ensure he saw GI for a colonoscopy.  Lonni Africa, D.O. [facility] Internal Medicine, PGY-2 Phone: [phone] Date 06/23/2024 Time 10:47 AM

## 2024-06-23 NOTE — Assessment & Plan Note (Signed)
 Ongoing phantom pain for which she also takes gabapentin .  He is very satisfied with its effect.  Alertness is not negatively impacted. - Continue gabapentin  400 4 times daily

## 2024-06-24 NOTE — Progress Notes (Signed)
 Internal Medicine Clinic Attending  Case discussed with the resident at the time of the visit.  We reviewed the resident's history and exam and pertinent patient test results.  I agree with the assessment, diagnosis, and plan of care documented in the resident's note.

## 2024-06-27 ENCOUNTER — Other Ambulatory Visit (HOSPITAL_COMMUNITY): Payer: Self-pay

## 2024-07-02 ENCOUNTER — Other Ambulatory Visit (HOSPITAL_COMMUNITY): Payer: Self-pay

## 2024-07-07 ENCOUNTER — Other Ambulatory Visit: Payer: Self-pay | Admitting: Pharmacy Technician

## 2024-07-07 ENCOUNTER — Other Ambulatory Visit: Payer: Self-pay

## 2024-07-07 NOTE — Progress Notes (Signed)
 Specialty Pharmacy Refill Coordination Note  [name] is a 63 y.o. male contacted today regarding refills of specialty medication(s) Bictegravir-Emtricitab-Tenofov (Biktarvy ); Darunavir -Cobicistat  ([name] )   Patient requested Delivery   Delivery date: 07/15/24   Verified address: [name] [place]   Medication will be filled on 07/14/24.

## 2024-07-14 ENCOUNTER — Encounter (HOSPITAL_COMMUNITY): Payer: Self-pay

## 2024-07-14 ENCOUNTER — Other Ambulatory Visit (HOSPITAL_COMMUNITY): Payer: Self-pay

## 2024-07-14 ENCOUNTER — Ambulatory Visit (INDEPENDENT_AMBULATORY_CARE_PROVIDER_SITE_OTHER): Admitting: Gastroenterology

## 2024-07-14 ENCOUNTER — Other Ambulatory Visit: Payer: Self-pay | Admitting: Student

## 2024-07-14 ENCOUNTER — Other Ambulatory Visit: Payer: Self-pay

## 2024-07-14 ENCOUNTER — Telehealth: Payer: Self-pay

## 2024-07-14 ENCOUNTER — Encounter: Payer: Self-pay | Admitting: Gastroenterology

## 2024-07-14 VITALS — BP 134/68 | HR 85 | Wt 114.0 lb

## 2024-07-14 DIAGNOSIS — K5900 Constipation, unspecified: Secondary | ICD-10-CM | POA: Diagnosis not present

## 2024-07-14 DIAGNOSIS — R195 Other fecal abnormalities: Secondary | ICD-10-CM

## 2024-07-14 DIAGNOSIS — Z89611 Acquired absence of right leg above knee: Secondary | ICD-10-CM | POA: Diagnosis not present

## 2024-07-14 DIAGNOSIS — Z86718 Personal history of other venous thrombosis and embolism: Secondary | ICD-10-CM

## 2024-07-14 DIAGNOSIS — Z7901 Long term (current) use of anticoagulants: Secondary | ICD-10-CM | POA: Diagnosis not present

## 2024-07-14 DIAGNOSIS — I739 Peripheral vascular disease, unspecified: Secondary | ICD-10-CM

## 2024-07-14 DIAGNOSIS — Z1211 Encounter for screening for malignant neoplasm of colon: Secondary | ICD-10-CM

## 2024-07-14 MED ORDER — NA SULFATE-K SULFATE-MG SULF 17.5-3.13-1.6 GM/177ML PO SOLN
1.0000 | Freq: Once | ORAL | 0 refills | Status: DC
  Filled 2024-07-14: qty 354, 1d supply, fill #0

## 2024-07-14 NOTE — Progress Notes (Signed)
 [name] [id number] 07-Apr-1961   Chief Complaint: Discuss colonoscopy  Referring Provider: Renne Homans, MD Primary GI MD: Sampson  HPI: [name] is a 63 y.o. male with past medical history of CKD, DVT, HIV, HLD, leg amputation above the knee (right), appendectomy, chronic opioid use who presents today to discuss colonoscopy.    FOBT + 11/2021.  Per chart review had previously declined colonoscopy.  Requested repeat stool testing.  Had positive Cologuard test 03/2024.  On Eliquis  for history of DVTs.  Labs 05/13/2024: Normal CBC, normal CMP, normal lipid panel, undetectable HIV RNA, nonreactive RPR   Patient states he has occasional constipation depending on what he eats.  Eats TV dinners frequently.  Otherwise denies any concerns today.  Denies any abdominal or rectal pain.  Denies any diarrhea, blood in his stool, or melena.  States stools are always brown.  As long as he has a balanced diet has regular bowel movements.  Denies any upper GI symptoms including heartburn, acid reflux, dysphagia, nausea, vomiting.  He does ambulate with a wheelchair due to history of right AKA.  Has prosthesis, and states that he is able to bear weight on his left leg.  Able to use the bathroom on his own without assistance.  He does have a home aide who is with him frequently.  Denies any chest pain or shortness of breath.  Previous GI Procedures/Imaging      Past Medical History:  Diagnosis Date   Chronic kidney disease    DVT (deep venous thrombosis) (HCC) 11/13/2023   History of chicken pox    History of DVT of lower extremity    right   HIV (human immunodeficiency virus infection) (HCC)    Hyperlipidemia    Left hip pain 03/09/2013   Pneumonia    Protein malnutrition (HCC) 08/31/2015    Past Surgical History:  Procedure Laterality Date   APPENDECTOMY  1962   LEG AMPUTATION ABOVE KNEE  2010   right leg    Current Outpatient Medications  Medication Sig Dispense  Refill   ASPIRIN 81 PO Take 81 mg by mouth daily.     bictegravir-emtricitabine -tenofovir  AF (BIKTARVY ) 50-200-25 MG TABS tablet Take 1 tablet by mouth daily. 30 tablet 11   darunavir -cobicistat  ([name] ) 800-150 MG tablet TAKE 1 TABLET BY MOUTH EVERY DAY DO NOT CRUSH BREAK OR CHEW TABLETS TAKE WITH FOOD 30 tablet 11   Docusate Calcium  (STOOL SOFTENER PO) Take 1 tablet by mouth as needed.     Ensure (ENSURE) Take 1 Can by mouth 4 (four) times daily - after meals and at bedtime. 237 mL 11   fluticasone  (FLONASE ) 50 MCG/ACT nasal spray Place 1 spray into both nostrils daily. 16 g 2   gabapentin  (NEURONTIN ) 400 MG capsule Take 1 capsule (400 mg total) by mouth 4 (four) times daily. Please mail gabapentin  with crestor  and ARV to pt 360 capsule 3   [START ON 08/23/2024] oxyCODONE  (OXY IR/ROXICODONE ) 5 MG immediate release tablet Take 1 tablet (5 mg total) by mouth 2 (two) times daily as needed for severe pain (pain score 7-10). 60 tablet 0   [START ON 07/24/2024] oxyCODONE  (OXY IR/ROXICODONE ) 5 MG immediate release tablet Take 1 tablet (5 mg total) by mouth 2 (two) times daily as needed for severe pain (pain score 7-10). 60 tablet 0   oxyCODONE  (OXY IR/ROXICODONE ) 5 MG immediate release tablet Take 1 tablet (5 mg total) by mouth 2 (two) times daily as needed for severe pain (pain score 7-10). 60  tablet 0   rivaroxaban  (XARELTO ) 2.5 MG TABS tablet Take 1 tablet (2.5 mg total) by mouth 2 (two) times daily. 60 tablet 11   rosuvastatin  (CRESTOR ) 10 MG tablet Take 1 tablet (10 mg total) by mouth daily. 30 tablet 11   No current facility-administered medications for this visit.    Allergies as of 07/14/2024 - Review Complete 07/14/2024  Allergen Reaction Noted   Dapsone     Megestrol   05/05/2015   Sulfa antibiotics Other (See Comments) 05/05/2015   Sulfamethoxazole-trimethoprim      Family History  Problem Relation Age of Onset   Arthritis Mother    Kidney disease Maternal Grandfather    Colon  polyps Neg Hx    Esophageal cancer Neg Hx    Pancreatic cancer Neg Hx    Stomach cancer Neg Hx     Social History   Tobacco Use   Smoking status: Every Day    Current packs/day: 1.00    Average packs/day: 1 pack/day for 36.0 years (36.0 ttl pk-yrs)    Types: Cigarettes   Smokeless tobacco: Never   Tobacco comments:    1 pack every 3 days  Vaping Use   Vaping status: Never Used  Substance Use Topics   Alcohol use: No    Alcohol/week: 0.0 standard drinks of alcohol   Drug use: Yes    Frequency: 2.0 times per week    Types: Marijuana     Review of Systems:    Constitutional: No fever, chills Cardiovascular: No chest pain Respiratory: No SOB Gastrointestinal: See HPI and otherwise negative Hematologic: No bleeding     Physical Exam:  Vital signs: BP 134/68   Pulse 85   Wt 114 lb (51.7 kg) Comment: pt reported, in wheelchair, nhr  BMI 15.90 kg/m   Wt Readings from Last 3 Encounters:  07/14/24 114 lb (51.7 kg)  06/23/24 115 lb (52.2 kg)  03/25/24 114 lb 3.2 oz (51.8 kg)   Constitutional: Pleasant, thin, chronically ill-appearing male in NAD, alert and cooperative, ambulates with wheelchair Head:  Normocephalic and atraumatic.  Eyes: No scleral icterus.  Mouth: No oral lesions. Respiratory: Respirations even and unlabored. Lungs clear to auscultation bilaterally.  No wheezes, crackles, or rhonchi.  Cardiovascular:  Regular rate and rhythm. No murmurs. No peripheral edema. Gastrointestinal:  Soft, nondistended, nontender. No rebound or guarding. Normal bowel sounds. No appreciable masses or hepatomegaly. Rectal: Deferred to colonoscopy Neurologic:  Alert and oriented x4;  grossly normal neurologically.  Skin:   Dry and intact without significant lesions or rashes. Psychiatric: Oriented to person, place and time. Demonstrates good judgement and reason without abnormal affect or behaviors.   RELEVANT LABS AND IMAGING: CBC    Component Value Date/Time   WBC 6.4  05/13/2024 0944   RBC 4.98 05/13/2024 0944   HGB 16.8 05/13/2024 0944   HGB 20.8 (HH) 12/23/2021 1157   HGB 15.2 04/01/2012 1105   HCT 49.3 05/13/2024 0944   HCT 59.0 (H) 12/23/2021 1157   HCT 43.9 04/01/2012 1105   PLT 237 05/13/2024 0944   PLT 235 12/23/2021 1157   MCV 99.0 05/13/2024 0944   MCV 96 12/23/2021 1157   MCV 109.2 (H) 04/01/2012 1105   MCH 33.7 (H) 05/13/2024 0944   MCHC 34.1 05/13/2024 0944   RDW 13.9 05/13/2024 0944   RDW 15.1 12/23/2021 1157   RDW 15.4 (H) 04/01/2012 1105   LYMPHSABS 4,289 (H) 11/22/2017 1047   LYMPHSABS 5.1 (H) 04/01/2012 1105   MONOABS 0.8 09/23/2015 0541  MONOABS 0.5 04/01/2012 1105   EOSABS 83 05/13/2024 0944   EOSABS 0.1 04/01/2012 1105   BASOSABS 19 05/13/2024 0944   BASOSABS 0.0 04/01/2012 1105    CMP     Component Value Date/Time   NA 138 05/13/2024 0944   NA 136 01/11/2017 1447   K 4.0 05/13/2024 0944   CL 101 05/13/2024 0944   CO2 29 05/13/2024 0944   GLUCOSE 99 05/13/2024 0944   BUN 13 05/13/2024 0944   BUN 7 01/11/2017 1447   CREATININE 1.31 05/13/2024 0944   CALCIUM  9.5 05/13/2024 0944   PROT 7.3 05/13/2024 0944   ALBUMIN 4.0 05/22/2017 0932   AST 20 05/13/2024 0944   ALT 17 05/13/2024 0944   ALKPHOS 408 (H) 05/22/2017 0932   BILITOT 1.0 05/13/2024 0944   GFRNONAA 47 (L) 11/22/2017 1047   GFRAA 55 (L) 11/22/2017 1047     Assessment/Plan:   Screening for colon cancer Chronic anticoagulation for history of DVT, PAD S/p right AKA Patient seen today to discuss scheduling colonoscopy.  Had positive Cologuard in June of this year.  Positive FOBT 11/2021.  Had previously declined colonoscopy but is now agreeable to undergo procedure. No evidence of anemia on recent labs.  Reports occasional constipation but denies seeing any blood in his stool or melena.  Denies any upper GI symptoms.  - Schedule colonoscopy. I thoroughly discussed the procedure with the patient to include nature of the procedure, alternatives,  benefits, and risks (including but not limited to bleeding, infection, perforation, anesthesia/cardiac/pulmonary complications). Patient verbalized understanding and gave verbal consent to proceed with procedure.  - Request Xarelto  hold - Will tentatively schedule procedure in [facility].  Patient does ambulate with a wheelchair but is able to bear weight on his left leg and has a right leg prosthesis.  Discussed he needs to be able to get himself onto the bed for procedure and he feels confident he will be able to do this.  Originally was looking at hospital-based procedures but there are no dates available within the next 4 to 6 weeks with any provider.   Camie Furbish, PA-C [facility] 07/14/2024, 10:10 AM  Patient Care Team: Renne Homans, MD as PCP - General Elaine Rush, MD (Inactive) as PCP - Infectious Diseases (Infectious Diseases)

## 2024-07-14 NOTE — Telephone Encounter (Signed)
 Oxycodone  has already been address; he has future rx for 9/25.

## 2024-07-14 NOTE — Patient Instructions (Signed)
 You have been scheduled for a colonoscopy. Please follow written instructions given to you at your visit today.   If you use inhalers (even only as needed), please bring them with you on the day of your procedure.  DO NOT TAKE 7 DAYS PRIOR TO TEST- Trulicity (dulaglutide) Ozempic, Wegovy (semaglutide) Mounjaro (tirzepatide) Bydureon Bcise (exanatide extended release)  DO NOT TAKE 1 DAY PRIOR TO YOUR TEST Rybelsus (semaglutide) Adlyxin (lixisenatide) Victoza (liraglutide) Byetta (exanatide) ___________________________________________________________________________   If your blood pressure at your visit was 140/90 or greater, please contact your primary care physician to follow up on this.  _______________________________________________________  If you are age 39 or older, your body mass index should be between 23-30. Your Body mass index is 15.9 kg/m. If this is out of the aforementioned range listed, please consider follow up with your Primary Care Provider.  If you are age 26 or younger, your body mass index should be between 19-25. Your Body mass index is 15.9 kg/m. If this is out of the aformentioned range listed, please consider follow up with your Primary Care Provider.   ________________________________________________________  The [facility] providers would like to encourage you to use MYCHART to communicate with providers for non-urgent requests or questions.  Due to long hold times on the telephone, sending your provider a message by [facility] may be a faster and more efficient way to get a response.  Please allow 48 business hours for a response.  Please remember that this is for non-urgent requests.  _______________________________________________________  [facility] is using a team-based approach to care.  Your team is made up of your doctor and two to three APPS. Our APPS (Nurse Practitioners and Physician Assistants) work with your physician to ensure care  continuity for you. They are fully qualified to address your health concerns and develop a treatment plan. They communicate directly with your gastroenterologist to care for you. Seeing the Advanced Practice Practitioners on your physician's team can help you by facilitating care more promptly, often allowing for earlier appointments, access to diagnostic testing, procedures, and other specialty referrals.

## 2024-07-14 NOTE — Telephone Encounter (Signed)
  [name] 12-21-1960 [id number]   Dear Dr. Renne:  We have scheduled the above named patient for a(n) colonoscopy procedure. Our records show that (s)he is on anticoagulation therapy.  Please advise as to whether the patient may come off their therapy of Xarelto  2 days prior to their procedure which is scheduled for 08/27/24.  Please route your response to Alan Fusi, CMA.  Sincerely,    [facility]

## 2024-07-15 ENCOUNTER — Other Ambulatory Visit: Payer: Self-pay

## 2024-07-15 ENCOUNTER — Telehealth: Payer: Self-pay | Admitting: Gastroenterology

## 2024-07-15 NOTE — Telephone Encounter (Signed)
 Please see addendum to note from Dr. Legrand:  If this patient wears his prosthesis the day of his colonoscopy, it can be done in the [facility]   In addition, I do not know which bowel preparation he was given with clinic instructions, but it needs to be a split dose GoLytely prep due to his chronic use of opioids.  I believe patient was given Suprep. Please change prep to split dose GoLytely and advise patient that he needs to wear his prosthesis on day of colonoscopy in order to have it done in [facility] and not hospital setting. Thank you

## 2024-07-15 NOTE — Telephone Encounter (Signed)
Informed patient to hold Xarelto 2 days prior to his procedure. Patient verbalized understanding.

## 2024-07-15 NOTE — Progress Notes (Signed)
 ____________________________________________________________  Attending physician addendum:  Thank you for sending this case to me. I have reviewed the entire note and agree with the plan.  If this patient wears his prosthesis the day of his colonoscopy, it can be done in the [facility].  In addition, he I do not know which bowel preparation he was given with clinic instructions, but it needs to be a split dose GoLytely prep due to his chronic use of opioids.  Victory Brand, MD  ____________________________________________________________

## 2024-07-16 ENCOUNTER — Other Ambulatory Visit (HOSPITAL_COMMUNITY): Payer: Self-pay

## 2024-07-16 ENCOUNTER — Telehealth: Payer: Self-pay

## 2024-07-16 ENCOUNTER — Other Ambulatory Visit: Payer: Self-pay | Admitting: Student

## 2024-07-16 ENCOUNTER — Other Ambulatory Visit: Payer: Self-pay

## 2024-07-16 ENCOUNTER — Ambulatory Visit

## 2024-07-16 VITALS — Ht 71.0 in | Wt 115.0 lb

## 2024-07-16 DIAGNOSIS — Z0000 Encounter for general adult medical examination without abnormal findings: Secondary | ICD-10-CM

## 2024-07-16 MED ORDER — PEG 3350-KCL-NA BICARB-NACL 420 G PO SOLR
4000.0000 mL | Freq: Once | ORAL | 0 refills | Status: AC
  Filled 2024-07-16: qty 4000, 1d supply, fill #0

## 2024-07-16 NOTE — Telephone Encounter (Signed)
 The pt has been advised of the new prep and instructions. He has been advised to wear prosthesis the day of the procedure.    He is aware new prep has been sent

## 2024-07-16 NOTE — Patient Instructions (Signed)
 [name],  Thank you for taking the time for your Medicare Wellness Visit. I appreciate your continued commitment to your health goals. Please review the care plan we discussed, and feel free to reach out if I can assist you further.  Medicare recommends these wellness visits once per year to help you and your care team stay ahead of potential health issues. These visits are designed to focus on prevention, allowing your provider to concentrate on managing your acute and chronic conditions during your regular appointments.  Please note that Annual Wellness Visits do not include a physical exam. Some assessments may be limited, especially if the visit was conducted virtually. If needed, we may recommend a separate in-person follow-up with your provider.  Ongoing Care Seeing your primary care provider every 3 to 6 months helps us  monitor your health and provide consistent, personalized care. Healthy Eating, Adult Healthy eating may help you get and keep a healthy body weight, reduce the risk of chronic disease, and live a long and productive life. It is important to follow a healthy eating pattern. Your nutritional and calorie needs should be met mainly by different nutrient-rich foods. What are tips for following this plan? Reading food labels Read labels and choose the following: Reduced or low sodium products. Juices with 100% fruit juice. Foods with low saturated fats (<3 g per serving) and high polyunsaturated and monounsaturated fats. Foods with whole grains, such as whole wheat, cracked wheat, brown rice, and wild rice. Whole grains that are fortified with folic acid. This is recommended for females who are pregnant or who want to become pregnant. Read labels and do not eat or drink the following: Foods or drinks with added sugars. These include foods that contain brown sugar, corn sweetener, corn syrup, dextrose , fructose, glucose, high-fructose corn syrup, honey, invert sugar, lactose,  malt syrup, maltose, molasses, raw sugar, sucrose, trehalose, or turbinado sugar. Limit your intake of added sugars to less than 10% of your total daily calories. Do not eat more than the following amounts of added sugar per day: 6 teaspoons (25 g) for females. 9 teaspoons (38 g) for males. Foods that contain processed or refined starches and grains. Refined grain products, such as white flour, degermed cornmeal, white bread, and white rice. Shopping Choose nutrient-rich snacks, such as vegetables, whole fruits, and nuts. Avoid high-calorie and high-sugar snacks, such as potato chips, fruit snacks, and candy. Use oil-based dressings and spreads on foods instead of solid fats such as butter, margarine, sour cream, or cream cheese. Limit pre-made sauces, mixes, and instant products such as flavored rice, instant noodles, and ready-made pasta. Try more plant-protein sources, such as tofu, tempeh, black beans, edamame, lentils, nuts, and seeds. Explore eating plans such as the Mediterranean diet or vegetarian diet. Try heart-healthy dips made with beans and healthy fats like hummus and guacamole. Vegetables go great with these. Cooking Use oil to saut or stir-fry foods instead of solid fats such as butter, margarine, or lard. Try baking, boiling, grilling, or broiling instead of frying. Remove the fatty part of meats before cooking. Steam vegetables in water or broth. Meal planning  At meals, imagine dividing your plate into fourths: One-half of your plate is fruits and vegetables. One-fourth of your plate is whole grains. One-fourth of your plate is protein, especially lean meats, poultry, eggs, tofu, beans, or nuts. Include low-fat dairy as part of your daily diet. Lifestyle Choose healthy options in all settings, including home, work, school, restaurants, or stores. Prepare your food  safely: Wash your hands after handling raw meats. Where you prepare food, keep surfaces clean by  regularly washing with hot, soapy water. Keep raw meats separate from ready-to-eat foods, such as fruits and vegetables. Cook seafood, meat, poultry, and eggs to the recommended temperature. Get a food thermometer. Store foods at safe temperatures. In general: Keep cold foods at 60F (4.4C) or below. Keep hot foods at 160F (60C) or above. Keep your freezer at [facility] (-17.8C) or below. Foods are not safe to eat if they have been between the temperatures of 40-160F (4.4-60C) for more than 2 hours. What foods should I eat? Fruits Aim to eat 1-2 cups of fresh, canned (in natural juice), or frozen fruits each day. One cup of fruit equals 1 small apple, 1 large banana, 8 large strawberries, 1 cup (237 g) canned fruit,  cup (82 g) dried fruit, or 1 cup (240 mL) 100% juice. Vegetables Aim to eat 2-4 cups of fresh and frozen vegetables each day, including different varieties and colors. One cup of vegetables equals 1 cup (91 g) broccoli or cauliflower florets, 2 medium carrots, 2 cups (150 g) raw, leafy greens, 1 large tomato, 1 large bell pepper, 1 large sweet potato, or 1 medium white potato. Grains Aim to eat 5-10 ounce-equivalents of whole grains each day. Examples of 1 ounce-equivalent of grains include 1 slice of bread, 1 cup (40 g) ready-to-eat cereal, 3 cups (24 g) popcorn, or  cup (93 g) cooked rice. Meats and other proteins Try to eat 5-7 ounce-equivalents of protein each day. Examples of 1 ounce-equivalent of protein include 1 egg,  oz nuts (12 almonds, 24 pistachios, or 7 walnut halves), 1/4 cup (90 g) cooked beans, 6 tablespoons (90 g) hummus or 1 tablespoon (16 g) peanut butter. A cut of meat or fish that is the size of a deck of cards is about 3-4 ounce-equivalents (85 g). Of the protein you eat each week, try to have at least 8 sounce (227 g) of seafood. This is about 2 servings per week. This includes salmon, trout, herring, sardines, and anchovies. Dairy Aim to eat 3  cup-equivalents of fat-free or low-fat dairy each day. Examples of 1 cup-equivalent of dairy include 1 cup (240 mL) milk, 8 ounces (250 g) yogurt, 1 ounces (44 g) natural cheese, or 1 cup (240 mL) fortified soy milk. Fats and oils Aim for about 5 teaspoons (21 g) of fats and oils per day. Choose monounsaturated fats, such as canola and olive oils, mayonnaise made with olive oil or avocado oil, avocados, peanut butter, and most nuts, or polyunsaturated fats, such as sunflower, corn, and soybean oils, walnuts, pine nuts, sesame seeds, sunflower seeds, and flaxseed. Beverages Aim for 6 eight-ounce glasses of water per day. Limit coffee to 3-5 eight-ounce cups per day. Limit caffeinated beverages that have added calories, such as soda and energy drinks. If you drink alcohol: Limit how much you have to: 0-1 drink a day if you are male. 0-2 drinks a day if you are male. Know how much alcohol is in your drink. In the [place] one drink is one 12 oz bottle of beer (355 mL), one 5 oz glass of wine (148 mL), or one 1 oz glass of hard liquor (44 mL). Seasoning and other foods Try not to add too much salt to your food. Try using herbs and spices instead of salt. Try not to add sugar to food. This information is based on [place] nutrition guidelines. To learn more, visit DisposableN[employer].be.  Exact amounts may vary. You may need different amounts. This information is not intended to replace advice given to you by your health care provider. Make sure you discuss any questions you have with your health care provider. Document Revised: 07/17/2022 Document Reviewed: 07/17/2022 Elsevier Patient Education  2024 Elsevier Inc.  Referrals If a referral was made during today's visit and you haven't received any updates within two weeks, please contact the referred provider directly to check on the status.  Recommended Screenings:  Health Maintenance  Topic Date Due   Zoster (Shingles) Vaccine (1 of 2) Never done    Colon Cancer Screening  Never done   Hepatitis B Vaccine (1 of 3 - Risk 3-dose series) 03/17/2021   Stool Blood Test  12/26/2022   Flu Shot  05/30/2024   COVID-19 Vaccine (5 - Pfizer risk 2024-25 season) 06/30/2024   DTaP/Tdap/Td vaccine (2 - Td or Tdap) 10/08/2024   Pneumococcal Vaccine for age over 81  Completed   Hepatitis C Screening  Completed   HIV Screening  Completed   HPV Vaccine  Aged Out   Meningitis B Vaccine  Aged Out       07/16/2024   12:05 PM  Advanced Directives  Does Patient Have a Medical Advance Directive? No  Would patient like information on creating a medical advance directive? No - Patient declined   Advance Care Planning is important because it: Ensures you receive medical care that aligns with your values, goals, and preferences. Provides guidance to your family and loved ones, reducing the emotional burden of decision-making during critical moments.  Vision: Annual vision screenings are recommended for early detection of glaucoma, cataracts, and diabetic retinopathy. These exams can also reveal signs of chronic conditions such as diabetes and high blood pressure.  Dental: Annual dental screenings help detect early signs of oral cancer, gum disease, and other conditions linked to overall health, including heart disease and diabetes.  Please see the attached documents for additional preventive care recommendations.

## 2024-07-16 NOTE — Telephone Encounter (Signed)
 Patient states that the pharmacy messed up his medication and since then he is always a week and a half behind on his pain meds. He ran out this past Saturday and he can't fill again until 9/25. He states that he is taking 2 a day. He is asking for this to be fixed since he doesn't eat when he is in pain. He is also asking for a new rx for stool medication. He states the one he is on now takes too long to work and then lasts too long. Please advise.

## 2024-07-16 NOTE — Progress Notes (Signed)
 Subjective:   [name] is a 63 y.o. male who presents for Medicare Annual/Subsequent preventive examination.  Visit Complete: Virtual I connected with  Delano Frate on 07/16/24 by a audio enabled telemedicine application and verified that I am speaking with the correct person using two identifiers.  Patient Location: Home  Provider Location: Home Office  I discussed the limitations of evaluation and management by telemedicine. The patient expressed understanding and agreed to proceed.  Vital Signs: Because this visit was a virtual/telehealth visit, some criteria may be missing or patient reported. Any vitals not documented were not able to be obtained and vitals that have been documented are patient reported.  Cardiac Risk Factors include: advanced age (>43men, >44 women);male gender     Objective:    Today's Vitals   07/16/24 1153 07/16/24 1154  Weight: 115 lb (52.2 kg)   Height: 5' 11 (1.803 m)   PainSc:  5    Body mass index is 16.04 kg/m.     07/16/2024   12:05 PM 06/23/2024    9:51 AM 11/16/2023   10:22 PM 09/04/2023    9:04 AM 07/26/2023    8:19 AM 04/09/2023   10:11 AM 01/15/2023   10:56 AM  Advanced Directives  Does Patient Have a Medical Advance Directive? No No No No No No No  Would patient like information on creating a medical advance directive? No - Patient declined No - Patient declined No - Patient declined No - Patient declined No - Patient declined No - Patient declined No - Patient declined    Current Medications (verified) Outpatient Encounter Medications as of 07/16/2024  Medication Sig   ASPIRIN 81 PO Take 81 mg by mouth daily.   bictegravir-emtricitabine -tenofovir  AF (BIKTARVY ) 50-200-25 MG TABS tablet Take 1 tablet by mouth daily.   darunavir -cobicistat  ([name] ) 800-150 MG tablet TAKE 1 TABLET BY MOUTH EVERY DAY DO NOT CRUSH BREAK OR CHEW TABLETS TAKE WITH FOOD   Docusate Calcium  (STOOL SOFTENER PO) Take 1 tablet by mouth as needed.   Ensure  (ENSURE) Take 1 Can by mouth 4 (four) times daily - after meals and at bedtime.   fluticasone  (FLONASE ) 50 MCG/ACT nasal spray Place 1 spray into both nostrils daily.   gabapentin  (NEURONTIN ) 400 MG capsule Take 1 capsule (400 mg total) by mouth 4 (four) times daily. Please mail gabapentin  with crestor  and ARV to pt   [START ON 08/23/2024] oxyCODONE  (OXY IR/ROXICODONE ) 5 MG immediate release tablet Take 1 tablet (5 mg total) by mouth 2 (two) times daily as needed for severe pain (pain score 7-10).   [START ON 07/24/2024] oxyCODONE  (OXY IR/ROXICODONE ) 5 MG immediate release tablet Take 1 tablet (5 mg total) by mouth 2 (two) times daily as needed for severe pain (pain score 7-10).   oxyCODONE  (OXY IR/ROXICODONE ) 5 MG immediate release tablet Take 1 tablet (5 mg total) by mouth 2 (two) times daily as needed for severe pain (pain score 7-10).   polyethylene glycol-electrolytes (NULYTELY) 420 g solution Take 4,000 mLs by mouth once for 1 dose.   rivaroxaban  (XARELTO ) 2.5 MG TABS tablet Take 1 tablet (2.5 mg total) by mouth 2 (two) times daily.   rosuvastatin  (CRESTOR ) 10 MG tablet Take 1 tablet (10 mg total) by mouth daily.   No facility-administered encounter medications on file as of 07/16/2024.    Allergies (verified) Dapsone, Megestrol , Sulfa antibiotics, and Sulfamethoxazole-trimethoprim   History: Past Medical History:  Diagnosis Date   Chronic kidney disease    DVT (deep venous  thrombosis) (HCC) 11/13/2023   History of chicken pox    History of DVT of lower extremity    right   HIV (human immunodeficiency virus infection) (HCC)    Hyperlipidemia    Left hip pain 03/09/2013   Pneumonia    Protein malnutrition (HCC) 08/31/2015   Past Surgical History:  Procedure Laterality Date   APPENDECTOMY  1962   LEG AMPUTATION ABOVE KNEE  2010   right leg   Family History  Problem Relation Age of Onset   Arthritis Mother    Kidney disease Maternal Grandfather    Colon polyps Neg Hx     Esophageal cancer Neg Hx    Pancreatic cancer Neg Hx    Stomach cancer Neg Hx    Social History   Socioeconomic History   Marital status: Widowed    Spouse name: Not on file   Number of children: 3   Years of education: Not on file   Highest education level: Not on file  Occupational History    Employer: UNEMPLOYED  Tobacco Use   Smoking status: Every Day    Current packs/day: 1.00    Average packs/day: 1 pack/day for 36.0 years (36.0 ttl pk-yrs)    Types: Cigarettes   Smokeless tobacco: Never   Tobacco comments:    1 pack every 3 days  Vaping Use   Vaping status: Never Used  Substance and Sexual Activity   Alcohol use: No    Alcohol/week: 0.0 standard drinks of alcohol   Drug use: Yes    Frequency: 2.0 times per week    Types: Marijuana   Sexual activity: Not Currently  Other Topics Concern   Not on file  Social History Narrative   Not on file   Social Drivers of Health   Financial Resource Strain: Low Risk  (07/16/2024)   Overall Financial Resource Strain (CARDIA)    Difficulty of Paying Living Expenses: Not hard at all  Food Insecurity: No Food Insecurity (07/16/2024)   Hunger Vital Sign    Worried About Running Out of Food in the Last Year: Never true    Ran Out of Food in the Last Year: Never true  Transportation Needs: No Transportation Needs (07/16/2024)   PRAPARE - Administrator, Civil Service (Medical): No    Lack of Transportation (Non-Medical): No  Physical Activity: Inactive (07/16/2024)   Exercise Vital Sign    Days of Exercise per Week: 0 days    Minutes of Exercise per Session: 0 min  Stress: No Stress Concern Present (07/16/2024)   [employer] of Occupational Health - Occupational Stress Questionnaire    Feeling of Stress: Only a little  Social Connections: Socially Isolated (07/16/2024)   Social Connection and Isolation Panel    Frequency of Communication with Friends and Family: Three times a week    Frequency of Social  Gatherings with Friends and Family: Twice a week    Attends Religious Services: Never    Database administrator or Organizations: No    Attends Banker Meetings: Never    Marital Status: Widowed    Tobacco Counseling Ready to quit: Not Answered Counseling given: Not Answered Tobacco comments: 1 pack every 3 days   Clinical Intake:  Pre-visit preparation completed: Yes  Pain : 0-10 Pain Score: 5  Pain Type: Chronic pain Pain Location: Leg (shoulder as well popped out of joint, toe) Pain Orientation: Right, Left Pain Descriptors / Indicators: Constant, Sharp, Nagging Pain Onset: 1 to  4 weeks ago Pain Frequency: Constant Pain Relieving Factors: Pain medications  Pain Relieving Factors: Pain medications  BMI - recorded: 15.9 Nutritional Status: BMI <19  Underweight Nutritional Risks: None Diabetes: No  How often do you need to have someone help you when you read instructions, pamphlets, or other written materials from your doctor or pharmacy?: 1 - Never  Interpreter Needed?: No  Information entered by :: Arnette Hoots, CMA   Activities of Daily Living    07/16/2024   11:58 AM 06/23/2024    9:50 AM  In your present state of health, do you have any difficulty performing the following activities:  Hearing? 0 0  Vision? 1 0  Difficulty concentrating or making decisions? 0 0  Walking or climbing stairs? 0 1  Dressing or bathing? 0 0  Doing errands, shopping? 0 0  Preparing Food and eating ? N   Using the Toilet? N   In the past six months, have you accidently leaked urine? N   Do you have problems with loss of bowel control? N   Managing your Medications? N   Managing your Finances? N   Housekeeping or managing your Housekeeping? N     Patient Care Team: Renne Homans, MD as PCP - General Elaine Rush, MD (Inactive) as PCP - Infectious Diseases (Infectious Diseases)  Indicate any recent Medical Services you may have received from other than  [facility] providers in the past year (date may be approximate).     Assessment:   This is a routine wellness examination for Arris.  Hearing/Vision screen Hearing Screening - Comments:: No issues Vision Screening - Comments:: Issues with Right eye- will make appt at [facility]   Goals Addressed   None    Depression Screen    07/16/2024   12:07 PM 06/23/2024    9:50 AM 05/13/2024    9:09 AM 12/10/2023   10:28 AM 11/14/2023   10:00 AM 09/04/2023    9:04 AM 07/26/2023    8:19 AM  PHQ 2/9 Scores  PHQ - 2 Score 0 0  0 0 0 0  Exception Documentation   Patient refusal        Fall Risk    07/16/2024   12:06 PM 06/23/2024    9:48 AM 05/13/2024    9:06 AM 11/14/2023   10:00 AM 09/04/2023    9:04 AM  Fall Risk   Falls in the past year? 0 0 0 1 0  Number falls in past yr: 0 0 0 0 0  Injury with Fall? 0 0 0 0 0  Risk for fall due to : Impaired balance/gait Impaired balance/gait Impaired mobility Impaired mobility Impaired balance/gait  Follow up Falls evaluation completed;Education provided Falls evaluation completed;Falls prevention discussed Falls evaluation completed  Falls prevention discussed;Falls evaluation completed    MEDICARE RISK AT HOME: Medicare Risk at Home Any stairs in or around the home?: No Home free of loose throw rugs in walkways, pet beds, electrical cords, etc?: No Adequate lighting in your home to reduce risk of falls?: No Life alert?: No Use of a cane, walker or w/c?: Yes (wheelchair and walker/cane) Grab bars in the bathroom?: Yes Shower chair or bench in shower?: Yes Elevated toilet seat or a handicapped toilet?: Yes  TIMED UP AND GO:  Was the test performed?  No    Cognitive Function:        07/16/2024   12:07 PM 07/26/2023    8:23 AM  6CIT Screen  What Year? 0 points  0 points  What month? 0 points 0 points  What time? 0 points 0 points  Count back from 20 0 points 0 points  Months in reverse 0 points 0 points  Repeat phrase 2 points 0 points  Total  Score 2 points 0 points    Immunizations Immunization History  Administered Date(s) Administered   H1N1 12/29/2008   Influenza Split 09/26/2011, 09/24/2012   Influenza Whole 09/27/2005, 08/19/2007, 08/23/2009, 09/21/2010   Influenza, Seasonal, Injecte, Preservative Fre 11/14/2023   Influenza,inj,Quad PF,6+ Mos 11/04/2013, 09/15/2014, 09/12/2015, 12/12/2016, 12/06/2017, 11/07/2018, 09/18/2019, 11/09/2020, 12/23/2021   PFIZER(Purple Top)SARS-COV-2 Vaccination 06/29/2020, 07/23/2020   PNEUMOCOCCAL CONJUGATE-20 11/14/2023   Pfizer(Comirnaty)Fall Seasonal Vaccine 12 years and older 11/30/2022, 11/14/2023   Pneumococcal Polysaccharide-23 09/01/2003, 04/21/2008   Tdap 10/08/2014    TDAP status: Up to date  Flu Vaccine status: Up to date  Pneumococcal vaccine status: Up to date  Covid-19 vaccine status: Completed vaccines  Qualifies for Shingles Vaccine? Yes   Zostavax completed Yes   Shingrix Completed?: Yes  Screening Tests Health Maintenance  Topic Date Due   Zoster Vaccines- Shingrix (1 of 2) Never done   Colonoscopy  Never done   Hepatitis B Vaccines 19-59 Average Risk (1 of 3 - Risk 3-dose series) 03/17/2021   COLON CANCER SCREENING ANNUAL FOBT  12/26/2022   Influenza Vaccine  05/30/2024   COVID-19 Vaccine (5 - Pfizer risk 2024-25 season) 06/30/2024   DTaP/Tdap/Td (2 - Td or Tdap) 10/08/2024   Pneumococcal Vaccine: 50+ Years  Completed   Hepatitis C Screening  Completed   HIV Screening  Completed   HPV VACCINES  Aged Out   Meningococcal B Vaccine  Aged Out    Health Maintenance  Health Maintenance Due  Topic Date Due   Zoster Vaccines- Shingrix (1 of 2) Never done   Colonoscopy  Never done   Hepatitis B Vaccines 19-59 Average Risk (1 of 3 - Risk 3-dose series) 03/17/2021   COLON CANCER SCREENING ANNUAL FOBT  12/26/2022   Influenza Vaccine  05/30/2024   COVID-19 Vaccine (5 - Pfizer risk 2024-25 season) 06/30/2024    Colorectal cancer screening: Type of  screening: Cologuard. Completed 03/2024. Repeat every 2 years. Came back positive. He is scheduled for colonoscopy.   Lung Cancer Screening: (Low Dose CT Chest recommended if Age 67-80 years, 20 pack-year currently smoking OR have quit w/in 15years.) does qualify.   Lung Cancer Screening Referral: Needs referral.   Additional Screening:  Hepatitis C Screening: does qualify; Completed 2010  Vision Screening: Recommended annual ophthalmology exams for early detection of glaucoma and other disorders of the eye. Is the patient up to date with their annual eye exam?  Yes  Who is the provider or what is the name of the office in which the patient attends annual eye exams? [facility] If pt is not established with a provider, would they like to be referred to a provider to establish care? No .   Dental Screening: Recommended annual dental exams for proper oral hygiene    Community Resource Referral / Chronic Care Management: CRR required this visit?  No   CCM required this visit?  No     Plan:     I have personally reviewed and noted the following in the patient's chart:   Medical and social history Use of alcohol, tobacco or illicit drugs  Current medications and supplements including opioid prescriptions. Patient is currently taking opioid prescriptions. Information provided to patient regarding non-opioid alternatives. Patient advised to discuss non-opioid treatment  plan with their provider. Functional ability and status Nutritional status Physical activity Advanced directives List of other physicians Hospitalizations, surgeries, and ER visits in previous 12 months Vitals Screenings to include cognitive, depression, and falls Referrals and appointments  In addition, I have reviewed and discussed with patient certain preventive protocols, quality metrics, and best practice recommendations. A written personalized care plan for preventive services as well as general preventive health  recommendations were provided to patient.     Arnette LOISE Hoots, CMA   07/16/2024   After Visit Summary: (Mail) Due to this being a telephonic visit, the after visit summary with patients personalized plan was offered to patient via mail   Nurse Notes: Patient states that he is out of pain medicine. He states that he ran out on Saturday. I let him know the note said he could fill on 9/25 and asked how he was using it. He states that he takes it once in the morning and once in the afternoon but he is still out. States that he doesn't eat when in pain. Message sent to provider. He is asking for a different stool medication. He states that this takes too long to work and then lasts too long.

## 2024-07-18 ENCOUNTER — Other Ambulatory Visit (HOSPITAL_COMMUNITY): Payer: Self-pay

## 2024-07-18 ENCOUNTER — Other Ambulatory Visit: Payer: Self-pay | Admitting: Student

## 2024-07-21 ENCOUNTER — Other Ambulatory Visit (HOSPITAL_BASED_OUTPATIENT_CLINIC_OR_DEPARTMENT_OTHER): Payer: Self-pay

## 2024-07-24 ENCOUNTER — Other Ambulatory Visit (HOSPITAL_COMMUNITY): Payer: Self-pay

## 2024-07-28 ENCOUNTER — Other Ambulatory Visit (HOSPITAL_COMMUNITY): Payer: Self-pay

## 2024-07-28 ENCOUNTER — Other Ambulatory Visit: Payer: Self-pay

## 2024-07-28 MED ORDER — OXYCODONE HCL 5 MG PO TABS
5.0000 mg | ORAL_TABLET | Freq: Two times a day (BID) | ORAL | 0 refills | Status: DC | PRN
  Filled 2024-07-28: qty 60, 30d supply, fill #0

## 2024-07-30 ENCOUNTER — Other Ambulatory Visit: Payer: Self-pay | Admitting: Student

## 2024-07-30 ENCOUNTER — Other Ambulatory Visit: Payer: Self-pay

## 2024-07-30 MED ORDER — FLUTICASONE PROPIONATE 50 MCG/ACT NA SUSP
1.0000 | Freq: Every day | NASAL | 2 refills | Status: AC
  Filled 2024-07-30: qty 16, 60d supply, fill #0
  Filled 2024-09-26: qty 16, 60d supply, fill #1
  Filled 2024-11-22: qty 16, 60d supply, fill #2

## 2024-08-08 ENCOUNTER — Other Ambulatory Visit: Payer: Self-pay

## 2024-08-08 NOTE — Progress Notes (Signed)
 Specialty Pharmacy Refill Coordination Note  [name] is a 63 y.o. male contacted today regarding refills of specialty medication(s) Bictegravir-Emtricitab-Tenofov (Biktarvy ); Darunavir -Cobicistat  ([name] )   Patient requested Delivery   Delivery date: 08/11/24   Verified address: [name] [place]   Medication will be filled on 08/08/24.

## 2024-08-26 DIAGNOSIS — Z89611 Acquired absence of right leg above knee: Secondary | ICD-10-CM | POA: Diagnosis not present

## 2024-08-27 ENCOUNTER — Ambulatory Visit: Admitting: Gastroenterology

## 2024-08-27 ENCOUNTER — Encounter: Payer: Self-pay | Admitting: Gastroenterology

## 2024-08-27 VITALS — BP 114/51 | HR 75 | Temp 98.7°F | Resp 12 | Ht 71.0 in | Wt 115.0 lb

## 2024-08-27 DIAGNOSIS — D122 Benign neoplasm of ascending colon: Secondary | ICD-10-CM

## 2024-08-27 DIAGNOSIS — K5730 Diverticulosis of large intestine without perforation or abscess without bleeding: Secondary | ICD-10-CM

## 2024-08-27 DIAGNOSIS — Z1211 Encounter for screening for malignant neoplasm of colon: Secondary | ICD-10-CM

## 2024-08-27 DIAGNOSIS — K648 Other hemorrhoids: Secondary | ICD-10-CM

## 2024-08-27 DIAGNOSIS — R195 Other fecal abnormalities: Secondary | ICD-10-CM

## 2024-08-27 DIAGNOSIS — N189 Chronic kidney disease, unspecified: Secondary | ICD-10-CM | POA: Diagnosis not present

## 2024-08-27 DIAGNOSIS — E785 Hyperlipidemia, unspecified: Secondary | ICD-10-CM | POA: Diagnosis not present

## 2024-08-27 DIAGNOSIS — D123 Benign neoplasm of transverse colon: Secondary | ICD-10-CM

## 2024-08-27 MED ORDER — SODIUM CHLORIDE 0.9 % IV SOLN: 500.0000 mL | INTRAVENOUS | Status: DC

## 2024-08-27 NOTE — Progress Notes (Signed)
 History and Physical:  This patient presents for endoscopic testing for: Encounter Diagnosis  Name Primary?   Positive colorectal cancer screening using Cologuard test Yes    63 year old man here today for evaluation of a positive Cologuard test  Further clinical details in our APP office note of 07/14/2024.  Reports having difficulty swallowing large pills but otherwise says he is able to eat and drink okay.  However he does not always have access to nutritious food, and says his nurses aide has not fixed a hot meal for him in over a month.  He typically eats microwave food, denies nausea or vomiting.  He has not seen any blood in the bowel movements and does not seem to have any abdominal pain as near as I can tell from our conversation. Last CD4 count 779 with an undetectable HIV viral load in July 2025  Eliquis  has been held for the last 2 days for this procedure.  Patient is otherwise without complaints or active issues today.   Past Medical History: Past Medical History:  Diagnosis Date   Chronic kidney disease    DVT (deep venous thrombosis) (HCC) 11/13/2023   History of chicken pox    History of DVT of lower extremity    right   HIV (human immunodeficiency virus infection) (HCC)    Hyperlipidemia    Left hip pain 03/09/2013   Pneumonia    Protein malnutrition 08/31/2015     Past Surgical History: Past Surgical History:  Procedure Laterality Date   APPENDECTOMY  1962   LEG AMPUTATION ABOVE KNEE  2010   right leg    Allergies: Allergies  Allergen Reactions   Dapsone Other (See Comments)    REACTION: fever   Megestrol  Other (See Comments)    Pt can not remember   Sulfa Antibiotics Other (See Comments)    put's me down bad   Sulfamethoxazole-Trimethoprim Other (See Comments)    REACTION: fever    Outpatient Meds: Current Outpatient Medications  Medication Sig Dispense Refill   ASPIRIN 81 PO Take 81 mg by mouth daily.      bictegravir-emtricitabine -tenofovir  AF (BIKTARVY ) 50-200-25 MG TABS tablet Take 1 tablet by mouth daily. 30 tablet 11   darunavir -cobicistat  ([name] ) 800-150 MG tablet TAKE 1 TABLET BY MOUTH EVERY DAY DO NOT CRUSH BREAK OR CHEW TABLETS TAKE WITH FOOD 30 tablet 11   Docusate Calcium  (STOOL SOFTENER PO) Take 1 tablet by mouth as needed.     Ensure (ENSURE) Take 1 Can by mouth 4 (four) times daily - after meals and at bedtime. 237 mL 11   fluticasone  (FLONASE ) 50 MCG/ACT nasal spray Place 1 spray into both nostrils daily. 16 g 2   gabapentin  (NEURONTIN ) 400 MG capsule Take 1 capsule (400 mg total) by mouth 4 (four) times daily. Please mail gabapentin  with crestor  and ARV to pt 360 capsule 3   oxyCODONE  (OXY IR/ROXICODONE ) 5 MG immediate release tablet Take 1 tablet (5 mg total) by mouth 2 (two) times daily as needed for severe pain (pain score 7-10). 60 tablet 0   rosuvastatin  (CRESTOR ) 10 MG tablet Take 1 tablet (10 mg total) by mouth daily. 30 tablet 11   rivaroxaban  (XARELTO ) 2.5 MG TABS tablet Take 1 tablet (2.5 mg total) by mouth 2 (two) times daily. 60 tablet 11   Current Facility-Administered Medications  Medication Dose Route Frequency Provider Last Rate Last Admin   0.9 %  sodium chloride  infusion  500 mL Intravenous Continuous Danis, Victory LITTIE MOULD, MD  ___________________________________________________________________ Objective   Exam:  BP 133/61   Pulse 81   Temp 98.7 F (37.1 C) (Temporal)   Ht 5' 11 (1.803 m)   Wt 115 lb (52.2 kg)   SpO2 96%   BMI 16.04 kg/m   Very thin with temporal wasting CV: regular , S1/S2 Resp: clear to auscultation bilaterally, normal RR and effort noted GI: soft, no tenderness, with active bowel sounds.   Assessment: Encounter Diagnosis  Name Primary?   Positive colorectal cancer screening using Cologuard test Yes     Plan: Colonoscopy   The benefits and risks of the planned procedure(s) were described in detail with the  patient or (when appropriate) their health care proxy.  Risks were outlined as including, but not limited to, bleeding, infection, perforation, adverse medication reaction leading to cardiac or pulmonary decompensation, pancreatitis (if ERCP).  The limitation of incomplete mucosal visualization was also discussed.  No guarantees or warranties were given.  The patient is appropriate for an endoscopic procedure in the ambulatory setting.   - Victory Brand, MD

## 2024-08-27 NOTE — Progress Notes (Signed)
 Vss nad trans to pacu

## 2024-08-27 NOTE — Patient Instructions (Addendum)
-  Handout on polyps, hemorrhoids and diverticulosis provided -Await pathology results -You can resume your Eliquis  tomorrow  YOU HAD AN ENDOSCOPIC PROCEDURE TODAY AT THE [facility]:   Refer to the procedure report that was given to you for any specific questions about what was found during the examination.  If the procedure report does not answer your questions, please call your gastroenterologist to clarify.  If you requested that your care partner not be given the details of your procedure findings, then the procedure report has been included in a sealed envelope for you to review at your convenience later.  YOU SHOULD EXPECT: Some feelings of bloating in the abdomen. Passage of more gas than usual.  Walking can help get rid of the air that was put into your GI tract during the procedure and reduce the bloating. If you had a lower endoscopy (such as a colonoscopy or flexible sigmoidoscopy) you may notice spotting of blood in your stool or on the toilet paper. If you underwent a bowel prep for your procedure, you may not have a normal bowel movement for a few days.  Please Note:  You might notice some irritation and congestion in your nose or some drainage.  This is from the oxygen  used during your procedure.  There is no need for concern and it should clear up in a day or so.  SYMPTOMS TO REPORT IMMEDIATELY:  Following lower endoscopy (colonoscopy or flexible sigmoidoscopy):  Excessive amounts of blood in the stool  Significant tenderness or worsening of abdominal pains  Swelling of the abdomen that is new, acute  Fever of 100F or higher  For urgent or emergent issues, a gastroenterologist can be reached at any hour by calling [phone]. Do not use MyChart messaging for urgent concerns.    DIET:  We do recommend a small meal at first, but then you may proceed to your regular diet.  Drink plenty of fluids but you should avoid alcoholic beverages for 24 hours.  ACTIVITY:   You should plan to take it easy for the rest of today and you should NOT DRIVE or use heavy machinery until tomorrow (because of the sedation medicines used during the test).    FOLLOW UP: Our staff will call the number listed on your records the next business day following your procedure.  We will call around 7:15- 8:00 am to check on you and address any questions or concerns that you may have regarding the information given to you following your procedure. If we do not reach you, we will leave a message.     If any biopsies were taken you will be contacted by phone or by letter within the next 1-3 weeks.  Please call us  at [phone] if you have not heard about the biopsies in 3 weeks.    SIGNATURES/CONFIDENTIALITY: You and/or your care partner have signed paperwork which will be entered into your electronic medical record.  These signatures attest to the fact that that the information above on your After Visit Summary has been reviewed and is understood.  Full responsibility of the confidentiality of this discharge information lies with you and/or your care-partner.

## 2024-08-27 NOTE — Progress Notes (Signed)
 Called to room to assist during endoscopic procedure.  Patient ID and intended procedure confirmed with present staff. Received instructions for my participation in the procedure from the performing physician.

## 2024-08-27 NOTE — Op Note (Signed)
 [facility] Patient Name: [name] Procedure Date: 08/27/2024 1:18 PM MRN: [id number] Endoscopist: Victory L. Legrand , MD, [id number] Age: 63 Referring MD:  Date of Birth: [birth year] Gender: Male Account #: [id number] Procedure:                Colonoscopy Indications:              Positive Cologuard test Medicines:                Monitored Anesthesia Care Procedure:                Pre-Anesthesia Assessment:                           - Prior to the procedure, a History and Physical                            was performed, and patient medications and                            allergies were reviewed. The patient's tolerance of                            previous anesthesia was also reviewed. The risks                            and benefits of the procedure and the sedation                            options and risks were discussed with the patient.                            All questions were answered, and informed consent                            was obtained. Prior Anticoagulants: The patient has                            taken Eliquis  (apixaban ), last dose was 2 days                            prior to procedure. ASA Grade Assessment: [name] - A                            patient with severe systemic disease. After                            reviewing the risks and benefits, the patient was                            deemed in satisfactory condition to undergo the                            procedure.  After obtaining informed consent, the colonoscope                            was passed under direct vision. Throughout the                            procedure, the patient's blood pressure, pulse, and                            oxygen  saturations were monitored continuously. The                            CF [id number] #[id number] was introduced through the anus                            and advanced to the the terminal ileum, with                             identification of the appendiceal orifice and IC                            valve. The colonoscopy was performed without                            difficulty. The patient tolerated the procedure                            well. The quality of the bowel preparation was                            excellent. The terminal ileum, ileocecal valve,                            appendiceal orifice, and rectum were photographed.                            The bowel preparation used was GoLYTELY. Scope In: 1:48:48 PM Scope Out: 2:06:32 PM Scope Withdrawal Time: 0 hours 14 minutes 0 seconds  Total Procedure Duration: 0 hours 17 minutes 44 seconds  Findings:                 The perianal and digital rectal examinations were                            normal.                           Two sessile polyps were found in the distal                            ascending colon. The polyps were diminutive in                            size. These polyps were removed with a cold snare.  Resection and retrieval were complete. (Jar 1)                           A 6 mm polyp was found in the transverse colon. The                            polyp was semi-pedunculated. The polyp was removed                            with a hot snare. Resection and retrieval were                            complete. (Jar 2)                           A few small-mouthed diverticula were found in the                            left colon.                           Internal hemorrhoids were found. The hemorrhoids                            were small.                           The exam was otherwise without abnormality on                            direct and retroflexion views. Complications:            No immediate complications. Estimated Blood Loss:     Estimated blood loss was minimal. Impression:               - Two diminutive polyps in the distal ascending                            colon,  removed with a cold snare. Resected and                            retrieved.                           - One 6 mm polyp in the transverse colon, removed                            with a hot snare. Resected and retrieved.                           - Diverticulosis in the left colon.                           - Internal hemorrhoids.                           -  The examination was otherwise normal on direct                            and retroflexion views. Recommendation:           - Patient has a contact number available for                            emergencies. The signs and symptoms of potential                            delayed complications were discussed with the                            patient. Return to normal activities tomorrow.                            Written discharge instructions were provided to the                            patient.                           - Resume previous diet.                           - Continue present medications.                           - Resume Eliquis  (apixaban ) at prior dose tomorrow.                           - Await pathology results.                           - Repeat colonoscopy is recommended for                            surveillance. The colonoscopy date will be                            determined after pathology results from today's                            exam become available for review. Avannah Decker L. Legrand, MD 08/27/2024 2:13:37 PM This report has been signed electronically.

## 2024-08-28 ENCOUNTER — Telehealth: Payer: Self-pay

## 2024-08-28 NOTE — Telephone Encounter (Signed)
  Follow up Call-     08/27/2024   12:48 PM  Call back number  Post procedure Call Back phone  # [phone]  Permission to leave phone message Yes     Patient questions:  Do you have a fever, pain , or abdominal swelling? No. Pain Score  0 *  Have you tolerated food without any problems? Yes.    Have you been able to return to your normal activities? Yes.    Do you have any questions about your discharge instructions: Diet   No. Medications  No. Follow up visit  No.  Do you have questions or concerns about your Care? No.  Actions: * If pain score is 4 or above: No action needed, pain <4.

## 2024-08-29 ENCOUNTER — Other Ambulatory Visit: Payer: Self-pay

## 2024-08-29 ENCOUNTER — Other Ambulatory Visit (HOSPITAL_COMMUNITY): Payer: Self-pay

## 2024-09-02 ENCOUNTER — Other Ambulatory Visit (HOSPITAL_COMMUNITY): Payer: Self-pay

## 2024-09-02 ENCOUNTER — Ambulatory Visit: Payer: Self-pay | Admitting: Gastroenterology

## 2024-09-02 LAB — SURGICAL PATHOLOGY

## 2024-09-04 ENCOUNTER — Other Ambulatory Visit: Payer: Self-pay

## 2024-09-04 ENCOUNTER — Other Ambulatory Visit (HOSPITAL_COMMUNITY): Payer: Self-pay

## 2024-09-04 NOTE — Progress Notes (Signed)
 Specialty Pharmacy Refill Coordination Note  [name] is a 63 y.o. male contacted today regarding refills of specialty medication(s) Bictegravir-Emtricitab-Tenofov (Biktarvy ); Darunavir -Cobicistat  ([name] )   Patient requested Delivery   Delivery date: 09/08/24   Verified address: [address] [place]   Medication will be filled on: 09/05/24

## 2024-09-11 ENCOUNTER — Other Ambulatory Visit: Payer: Self-pay | Admitting: Student

## 2024-09-11 DIAGNOSIS — Z89611 Acquired absence of right leg above knee: Secondary | ICD-10-CM

## 2024-09-11 NOTE — Telephone Encounter (Deleted)
 Copied from CRM #[id number]. Topic: Clinical - Medication Refill >> Sep 11, 2024 12:32 PM Merlynn A wrote: Medication: oxyCODONE  (OXY IR/ROXICODONE ) 5 MG immediate release tablet  Has the patient contacted their pharmacy? No (Agent: If no, request that the patient contact the pharmacy for the refill. If patient does not wish to contact the pharmacy document the reason why and proceed with request.) (Agent: If yes, when and what did the pharmacy advise?)  This is the patient's preferred pharmacy:  [place] [facility] [address] [place] [address] Phone: [phone] Fax: [phone]  Is this the correct pharmacy for this prescription? Yes If no, delete pharmacy and type the correct one.   Has the prescription been filled recently? Yes  Is the patient out of the medication? Yes  Has the patient been seen for an appointment in the last year OR does the patient have an upcoming appointment? Yes  Can we respond through MyChart? Yes  Agent: Please be advised that Rx refills may take up to 3 business days. We ask that you follow-up with your pharmacy.

## 2024-09-11 NOTE — Telephone Encounter (Signed)
 Copied from CRM #[id number]. Topic: Clinical - Medication Refill >> Sep 11, 2024 12:35 PM Merlynn A wrote: Medication: oxyCODONE  (OXY IR/ROXICODONE ) 5 MG immediate release tablet  Has the patient contacted their pharmacy? No (Agent: If no, request that the patient contact the pharmacy for the refill. If patient does not wish to contact the pharmacy document the reason why and proceed with request.) (Agent: If yes, when and what did the pharmacy advise?)  Patient stated he is out of medication. He stated that he is taking 2 pills per day. Patient is requesting refill.   This is the patient's preferred pharmacy:  [facility] [address] [place] [address] Phone: [phone] Fax: [phone]  Is this the correct pharmacy for this prescription? Yes If no, delete pharmacy and type the correct one.   Has the prescription been filled recently? Yes  Is the patient out of the medication? Yes  Has the patient been seen for an appointment in the last year OR does the patient have an upcoming appointment? Yes  Can we respond through MyChart? Yes  Agent: Please be advised that Rx refills may take up to 3 business days. We ask that you follow-up with your pharmacy.

## 2024-09-11 NOTE — Telephone Encounter (Signed)
 Last rx written - 08/23/24. Last OV - 06/23/24. Next OV - 09/22/24.

## 2024-09-15 ENCOUNTER — Other Ambulatory Visit (HOSPITAL_COMMUNITY): Payer: Self-pay

## 2024-09-15 ENCOUNTER — Other Ambulatory Visit: Payer: Self-pay | Admitting: Student

## 2024-09-15 ENCOUNTER — Other Ambulatory Visit: Payer: Self-pay

## 2024-09-15 NOTE — Progress Notes (Signed)
 Specialty Pharmacy Ongoing Clinical Assessment Note  [name] is a 63 y.o. male who is being followed by the specialty pharmacy service for RxSp HIV   Patient's specialty medication(s) reviewed today: Bictegravir-Emtricitab-Tenofov (Biktarvy ); Darunavir -Cobicistat  ([name] )   Missed doses in the last 4 weeks: 0   Patient/Caregiver did not have any additional questions or concerns.   Therapeutic benefit summary: Patient is achieving benefit   Adverse events/side effects summary: No adverse events/side effects   Patient's therapy is appropriate to: Continue    Goals Addressed             This Visit's Progress    Achieve Undetectable HIV Viral Load < 20   On track    Patient is on track. Patient will maintain adherence. Viral load remains undetectable long term.          Follow up: 12 months  [facility]

## 2024-09-16 ENCOUNTER — Other Ambulatory Visit: Payer: Self-pay

## 2024-09-16 ENCOUNTER — Other Ambulatory Visit (HOSPITAL_COMMUNITY): Payer: Self-pay

## 2024-09-16 MED ORDER — OXYCODONE HCL 5 MG PO TABS
5.0000 mg | ORAL_TABLET | Freq: Two times a day (BID) | ORAL | 0 refills | Status: AC | PRN
  Filled 2024-09-16: qty 60, 30d supply, fill #0

## 2024-09-18 ENCOUNTER — Other Ambulatory Visit: Payer: Self-pay

## 2024-09-22 ENCOUNTER — Encounter: Payer: Self-pay | Admitting: Student

## 2024-09-22 ENCOUNTER — Ambulatory Visit (INDEPENDENT_AMBULATORY_CARE_PROVIDER_SITE_OTHER): Payer: Self-pay | Admitting: Student

## 2024-09-22 VITALS — BP 138/65 | HR 96 | Temp 97.6°F | Ht 71.0 in | Wt 115.4 lb

## 2024-09-22 DIAGNOSIS — Z7901 Long term (current) use of anticoagulants: Secondary | ICD-10-CM

## 2024-09-22 DIAGNOSIS — I8290 Acute embolism and thrombosis of unspecified vein: Secondary | ICD-10-CM | POA: Diagnosis not present

## 2024-09-22 DIAGNOSIS — I824Z9 Acute embolism and thrombosis of unspecified deep veins of unspecified distal lower extremity: Secondary | ICD-10-CM

## 2024-09-22 DIAGNOSIS — E785 Hyperlipidemia, unspecified: Secondary | ICD-10-CM

## 2024-09-22 DIAGNOSIS — F1011 Alcohol abuse, in remission: Secondary | ICD-10-CM

## 2024-09-22 DIAGNOSIS — Z79899 Other long term (current) drug therapy: Secondary | ICD-10-CM

## 2024-09-22 DIAGNOSIS — F17210 Nicotine dependence, cigarettes, uncomplicated: Secondary | ICD-10-CM

## 2024-09-22 DIAGNOSIS — Z716 Tobacco abuse counseling: Secondary | ICD-10-CM

## 2024-09-22 DIAGNOSIS — R911 Solitary pulmonary nodule: Secondary | ICD-10-CM

## 2024-09-22 DIAGNOSIS — F17200 Nicotine dependence, unspecified, uncomplicated: Secondary | ICD-10-CM

## 2024-09-22 DIAGNOSIS — Z89611 Acquired absence of right leg above knee: Secondary | ICD-10-CM

## 2024-09-22 NOTE — Assessment & Plan Note (Signed)
 The patient reports smoking approximately 2-3 cigarettes per day but is unable to recall when he started smoking or his previous smoking habits. When asked, he stated, I barely smoke any cigarettes anyway, sometimes smoke if used again when it is cold. Due to limited information, an accurate pack-year history cannot be calculated. Nevertheless, I believe he may benefit from lung cancer screening with a low-dose CT at this time. The patient was counseled on this and is agreeable to proceeding with the screening. We discussed available medical therapies to aid smoking cessation. The patient reported prior attempts with medication, which he discontinued due to side effects, as advised by his wife. Nicotine replacement options, such as patches and gum, were discussed; however, he is not ready to try these at this time. Smoking cessation will be revisited during the next visit. - Ambulatory referral to colon cancer screening

## 2024-09-22 NOTE — Patient Instructions (Addendum)
 Thank you, [name] for allowing us  to provide your care today. Today we discussed your overall health.   Your pain medications is refilled.  Continue taking your medications as discussed and thank you for getting the colonoscopy done     I have ordered the following labs for you:  Lab Orders  No laboratory test(s) ordered today     Tests ordered today:    Referrals ordered today:   Referral Orders  No referral(s) requested today     I have ordered the following medication/changed the following medications:   Stop the following medications: There are no discontinued medications.   Start the following medications: No orders of the defined types were placed in this encounter.    Follow up: 4 months    Should you have any questions or concerns please call the internal medicine clinic at [phone].   Drue Lisa Grow MD 09/22/2024, 10:39 AM   [facility]

## 2024-09-22 NOTE — Assessment & Plan Note (Signed)
 Stable on Xarelto  2.5 mg twice daily.  Denies any bleeding concerns.

## 2024-09-22 NOTE — Assessment & Plan Note (Signed)
 LDL of 85 four months ago.  Tolerating Crestor  10 mg.  Will continue

## 2024-09-22 NOTE — Progress Notes (Signed)
 CC: Routine office visit for pain medicine refill   HPI:  [name] is a 63 y.o. male living with a history stated below and presents today for routine office visit. Please see problem based assessment and plan for additional details.  Past Medical History:  Diagnosis Date   Chronic kidney disease    DVT (deep venous thrombosis) (HCC) 11/13/2023   History of chicken pox    History of DVT of lower extremity    right   HIV (human immunodeficiency virus infection) (HCC)    Hyperlipidemia    Left hip pain 03/09/2013   Pneumonia    Protein malnutrition 08/31/2015    Current Outpatient Medications on File Prior to Visit  Medication Sig Dispense Refill   ASPIRIN 81 PO Take 81 mg by mouth daily.     bictegravir-emtricitabine -tenofovir  AF (BIKTARVY ) 50-200-25 MG TABS tablet Take 1 tablet by mouth daily. 30 tablet 11   darunavir -cobicistat  ([name] ) 800-150 MG tablet TAKE 1 TABLET BY MOUTH EVERY DAY DO NOT CRUSH BREAK OR CHEW TABLETS TAKE WITH FOOD 30 tablet 11   Docusate Calcium  (STOOL SOFTENER PO) Take 1 tablet by mouth as needed.     Ensure (ENSURE) Take 1 Can by mouth 4 (four) times daily - after meals and at bedtime. 237 mL 11   fluticasone  (FLONASE ) 50 MCG/ACT nasal spray Place 1 spray into both nostrils daily. 16 g 2   gabapentin  (NEURONTIN ) 400 MG capsule Take 1 capsule (400 mg total) by mouth 4 (four) times daily. Please mail gabapentin  with crestor  and ARV to pt 360 capsule 3   oxyCODONE  (OXY IR/ROXICODONE ) 5 MG immediate release tablet Take 1 tablet (5 mg total) by mouth 2 (two) times daily as needed for severe pain (pain score 7-10). 60 tablet 0   rivaroxaban  (XARELTO ) 2.5 MG TABS tablet Take 1 tablet (2.5 mg total) by mouth 2 (two) times daily. 60 tablet 11   rosuvastatin  (CRESTOR ) 10 MG tablet Take 1 tablet (10 mg total) by mouth daily. 30 tablet 11   No current facility-administered medications on file prior to visit.    Family History  Problem Relation Age of  Onset   Arthritis Mother    Kidney disease Maternal Grandfather    Colon polyps Neg Hx    Esophageal cancer Neg Hx    Pancreatic cancer Neg Hx    Stomach cancer Neg Hx    Colon cancer Neg Hx    Rectal cancer Neg Hx     Social History   Socioeconomic History   Marital status: Widowed    Spouse name: Not on file   Number of children: 3   Years of education: Not on file   Highest education level: Not on file  Occupational History    Employer: UNEMPLOYED  Tobacco Use   Smoking status: Every Day    Current packs/day: 1.00    Average packs/day: 1 pack/day for 36.0 years (36.0 ttl pk-yrs)    Types: Cigarettes   Smokeless tobacco: Never   Tobacco comments:    1 pack every 3 days  Vaping Use   Vaping status: Never Used  Substance and Sexual Activity   Alcohol use: Yes    Comment: occasionally a beer   Drug use: Yes    Frequency: 2.0 times per week    Types: Marijuana   Sexual activity: Not Currently  Other Topics Concern   Not on file  Social History Narrative   Not on file   Social Drivers of Health  Financial Resource Strain: Low Risk  (07/16/2024)   Overall Financial Resource Strain (CARDIA)    Difficulty of Paying Living Expenses: Not hard at all  Food Insecurity: No Food Insecurity (07/16/2024)   Hunger Vital Sign    Worried About Running Out of Food in the Last Year: Never true    Ran Out of Food in the Last Year: Never true  Transportation Needs: No Transportation Needs (07/16/2024)   PRAPARE - Administrator, Civil Service (Medical): No    Lack of Transportation (Non-Medical): No  Physical Activity: Inactive (07/16/2024)   Exercise Vital Sign    Days of Exercise per Week: 0 days    Minutes of Exercise per Session: 0 min  Stress: No Stress Concern Present (07/16/2024)   [employer] of Occupational Health - Occupational Stress Questionnaire    Feeling of Stress: Only a little  Social Connections: Socially Isolated (07/16/2024)   Social  Connection and Isolation Panel    Frequency of Communication with Friends and Family: Three times a week    Frequency of Social Gatherings with Friends and Family: Twice a week    Attends Religious Services: Never    Database Administrator or Organizations: No    Attends Banker Meetings: Never    Marital Status: Widowed  Intimate Partner Violence: Not At Risk (07/16/2024)   Humiliation, Afraid, Rape, and Kick questionnaire    Fear of Current or Ex-Partner: No    Emotionally Abused: No    Physically Abused: No    Sexually Abused: No    Review of Systems: ROS negative except for what is noted on the assessment and plan.  Vitals:   09/22/24 1002  BP: 138/65  Pulse: 96  Temp: 97.6 F (36.4 C)  TempSrc: Oral  SpO2: 97%  Weight: 115 lb 6.4 oz (52.3 kg)  Height: 5' 11 (1.803 m)    Physical Exam: Constitutional: Chronically ill-appearing man, with a walker in no acute distress Cardiovascular: regular rate and rhythm, no m/r/g Pulmonary/Chest: normal work of breathing on room air, lungs clear to auscultation bilaterally Abdominal: soft, non-tender, non-distended MSK: normal bulk and tone Psych: normal mood and behavior  Assessment & Plan:   CIGARETTE SMOKER The patient reports smoking approximately 2-3 cigarettes per day but is unable to recall when he started smoking or his previous smoking habits. When asked, he stated, I barely smoke any cigarettes anyway, sometimes smoke if used again when it is cold. Due to limited information, an accurate pack-year history cannot be calculated. Nevertheless, I believe he may benefit from lung cancer screening with a low-dose CT at this time. The patient was counseled on this and is agreeable to proceeding with the screening.We discussed available medical therapies to aid smoking cessation. The patient reported prior attempts with medication, which he discontinued due to side effects, as advised by his wife. Nicotine replacement  options, such as patches and gum, were discussed; however, he is not ready to try these at this time. Smoking cessation will be revisited during the next visit. - Ambulatory referral to colon cancer screening  DVT (deep venous thrombosis) (HCC) Stable on Xarelto  2.5 mg twice daily.  Denies any bleeding concerns.  History of alcohol abuse Reported continued abstinence.  Hyperlipemia LDL of 85 four months ago.  Tolerating Crestor  10 mg.  Will continue    Patient discussed with Dr. Karna Drue Grow, M.D [facility] Phone: [phone] Date 09/22/2024 Time 12:05 PM

## 2024-09-22 NOTE — Assessment & Plan Note (Signed)
 Reported continued abstinence.

## 2024-09-24 ENCOUNTER — Other Ambulatory Visit (HOSPITAL_COMMUNITY): Payer: Self-pay

## 2024-09-26 ENCOUNTER — Other Ambulatory Visit (HOSPITAL_COMMUNITY): Payer: Self-pay

## 2024-09-26 ENCOUNTER — Other Ambulatory Visit: Payer: Self-pay

## 2024-09-26 ENCOUNTER — Other Ambulatory Visit: Payer: Self-pay | Admitting: Student

## 2024-09-26 DIAGNOSIS — Z89611 Acquired absence of right leg above knee: Secondary | ICD-10-CM

## 2024-09-29 ENCOUNTER — Other Ambulatory Visit: Payer: Self-pay

## 2024-09-29 NOTE — Progress Notes (Signed)
 Internal Medicine Clinic Attending  Case discussed with the resident at the time of the visit.  We reviewed the resident's history and exam and pertinent patient test results.  I agree with the assessment, diagnosis, and plan of care documented in the resident's note.

## 2024-09-30 ENCOUNTER — Other Ambulatory Visit (HOSPITAL_COMMUNITY): Payer: Self-pay

## 2024-09-30 ENCOUNTER — Other Ambulatory Visit: Payer: Self-pay

## 2024-09-30 NOTE — Progress Notes (Signed)
 Specialty Pharmacy Refill Coordination Note  [name] is a 63 y.o. male contacted today regarding refills of specialty medication(s) Bictegravir-Emtricitab-Tenofov (Biktarvy ); Darunavir -Cobicistat  ([name] )   Patient requested Delivery   Delivery date: 10/14/24   Verified address: [address] [name]   Medication will be filled on: 10/13/24

## 2024-10-13 ENCOUNTER — Other Ambulatory Visit: Payer: Self-pay

## 2024-10-16 ENCOUNTER — Other Ambulatory Visit: Payer: Self-pay | Admitting: Student

## 2024-10-16 ENCOUNTER — Other Ambulatory Visit (HOSPITAL_COMMUNITY): Payer: Self-pay

## 2024-10-16 DIAGNOSIS — Z89611 Acquired absence of right leg above knee: Secondary | ICD-10-CM

## 2024-10-16 NOTE — Telephone Encounter (Signed)
 Last rx written - 09/16/24. Last OV - 09/22/24. TOX -12/17/15.

## 2024-10-16 NOTE — Telephone Encounter (Unsigned)
 Copied from CRM #[id number]. Topic: Clinical - Prescription Issue >> Oct 16, 2024  9:26 AM Graeme ORN wrote: Reason for CRM: Patient called to request refill for pain meds oxyCODONE -  Refill already submitted by pharmacy and pending. Patient states they will run out tomorrow and want to see if it can be sent before the weekend. Thank You    ----------------------------------------------------------------------- From previous Reason for Contact - Medication Refill: Medication:   Has the patient contacted their pharmacy?   (Agent: If no, request that the patient contact the pharmacy for the refill. If patient does not wish to contact the pharmacy document the reason why and proceed with request.) (Agent: If yes, when and what did the pharmacy advise?)  This is the patient's preferred pharmacy:  [place] [facility] [address] [place] [address] Phone: [phone] Fax: [phone]  [id number] - [place] - [address] [place] [address] Phone: [phone] Fax: [phone]  [id number] - [place],  - [address] AT [facility] [address] [place] [address] Phone: [phone] Fax: [phone]  Is this the correct pharmacy for this prescription?   If no, delete pharmacy and type the correct one.   Has the prescription been filled recently?    Is the patient out of the medication?    Has the patient been seen for an appointment in the last year OR does the patient have an upcoming appointment?    Can we respond through MyChart?    Agent: Please be advised that Rx refills may take up to 3 business days. We ask that you follow-up with your pharmacy.

## 2024-10-17 ENCOUNTER — Other Ambulatory Visit (HOSPITAL_COMMUNITY): Payer: Self-pay

## 2024-10-17 ENCOUNTER — Other Ambulatory Visit: Payer: Self-pay

## 2024-10-17 MED ORDER — OXYCODONE HCL 5 MG PO TABS
5.0000 mg | ORAL_TABLET | Freq: Two times a day (BID) | ORAL | 0 refills | Status: DC | PRN
  Filled 2024-10-17: qty 60, 30d supply, fill #0

## 2024-10-20 ENCOUNTER — Other Ambulatory Visit (HOSPITAL_COMMUNITY): Payer: Self-pay

## 2024-10-20 MED ORDER — OXYCODONE HCL 5 MG PO TABS
5.0000 mg | ORAL_TABLET | Freq: Two times a day (BID) | ORAL | 0 refills | Status: DC | PRN
  Filled 2024-10-20: qty 60, 30d supply, fill #0

## 2024-10-20 NOTE — Telephone Encounter (Signed)
 Copied from CRM #[id number]. Topic: Clinical - Prescription Issue >> Oct 16, 2024  9:26 AM [name] wrote: Reason for CRM: Patient called to request refill for pain meds oxyCODONE -  Refill already submitted by pharmacy and pending. Patient states they will run out tomorrow and want to see if it can be sent before the weekend. Thank You    ----------------------------------------------------------------------- From previous Reason for Contact - Medication Refill: Medication:   Has the patient contacted their pharmacy?   (Agent: If no, request that the patient contact the pharmacy for the refill. If patient does not wish to contact the pharmacy document the reason why and proceed with request.) (Agent: If yes, when and what did the pharmacy advise?)  This is the patient's preferred pharmacy:   - [facility] [address] [place] [address] Phone: [phone] Fax: [phone]  [id number] - [place], [place] - [address] [place] [address] Phone: [phone] Fax: [phone]  [facility] #[id number] - [place], [place] - [address] AT [facility] [name] [address] Phone: [phone] Fax: [phone]  Is this the correct pharmacy for this prescription?   If no, delete pharmacy and type the correct one.   Has the prescription been filled recently?    Is the patient out of the medication?    Has the patient been seen for an appointment in the last year OR does the patient have an upcoming appointment?    Can we respond through MyChart?    Agent: Please be advised that Rx refills may take up to 3 business days. We ask that you follow-up with your pharmacy. >> Oct 20, 2024 10:07 AM [name] wrote: Patient calling to check the  status of refill informed the patient refills could take 3 business days and the office  started working on the refill after the pharmacy sent information, the patient is out of medication since Friday and he is checking the status of his refill prescription cause he is in pain and only have extra strength tylenol  he states this does no good  The patient wants to ask if the can keep the day the patient is out of his pain medication so he doesn't have to go without his medication, cause the patient states the pharmacy delivers his medication on Tuesday with delivery   Pt num [phone]   - [facility] [address] [place] [address] Phone: [phone] Fax: [phone]  [name] delivers to his home

## 2024-10-20 NOTE — Addendum Note (Signed)
 Addended by: ROSAN DAYTON BROCKS on: 10/20/2024 03:38 PM   Modules accepted: Orders

## 2024-10-20 NOTE — Telephone Encounter (Signed)
 Sending to The Attending to assist with refill.

## 2024-10-20 NOTE — Telephone Encounter (Signed)
 Copied from CRM [id number]. Topic: Clinical - Prescription Issue >> Oct 20, 2024  2:13 PM Mercer PEDLAR wrote: Reason for CRM: Amy - [facility], Is calling regarding prescription for oxyCODONE  (OXY IR/ROXICODONE ) 5 MG immediate release tablet. She is requesting for prescription to be sent by a different provider due to [facility] number not being found for Dr. Amoako. She stated patient is out of medication at this time.   [place] [facility] [address] [place] [address] Phone: [phone] Fax: [phone]

## 2024-10-21 ENCOUNTER — Other Ambulatory Visit (HOSPITAL_COMMUNITY): Payer: Self-pay

## 2024-10-23 ENCOUNTER — Other Ambulatory Visit: Payer: Self-pay | Admitting: Infectious Disease

## 2024-10-23 DIAGNOSIS — E785 Hyperlipidemia, unspecified: Secondary | ICD-10-CM

## 2024-10-23 DIAGNOSIS — B20 Human immunodeficiency virus [HIV] disease: Secondary | ICD-10-CM

## 2024-10-24 ENCOUNTER — Other Ambulatory Visit (HOSPITAL_COMMUNITY): Payer: Self-pay

## 2024-10-24 ENCOUNTER — Other Ambulatory Visit: Payer: Self-pay

## 2024-10-24 MED ORDER — ROSUVASTATIN CALCIUM 10 MG PO TABS
10.0000 mg | ORAL_TABLET | Freq: Every day | ORAL | 2 refills | Status: AC
  Filled 2024-10-24: qty 30, 30d supply, fill #0
  Filled 2024-11-22: qty 30, 30d supply, fill #1

## 2024-10-27 ENCOUNTER — Ambulatory Visit (HOSPITAL_COMMUNITY)

## 2024-11-05 ENCOUNTER — Other Ambulatory Visit: Payer: Self-pay | Admitting: Pharmacy Technician

## 2024-11-05 ENCOUNTER — Other Ambulatory Visit: Payer: Self-pay

## 2024-11-05 NOTE — Progress Notes (Signed)
 Specialty Pharmacy Refill Coordination Note  [name] is a 64 y.o. male contacted today regarding refills of specialty medication(s) Bictegravir-Emtricitab-Tenofov (Biktarvy ); Darunavir -Cobicistat  ([name] )   Patient requested Delivery   Delivery date: 11/11/24   Verified address: [address] [place]   Medication will be filled on: 11/10/24

## 2024-11-07 ENCOUNTER — Other Ambulatory Visit: Payer: Self-pay

## 2024-11-10 ENCOUNTER — Telehealth: Payer: Self-pay | Admitting: *Deleted

## 2024-11-10 NOTE — Telephone Encounter (Signed)
 RTC to patient is requesting additional pain meds.  States is having increased pain in his left lg and feet.  As well as the left shoulder.  States was previously on morphine  which was discontinued.  States perhaps 1 more pill a day would help.  Informed patient that he may need to come in prior to getting extra mediation.  Will forward message to PCP.            Copied from CRM #[id number]. Topic: Clinical - Medication Question >> Nov 10, 2024 12:53 PM Suzette B wrote: Reason for CRM: pt is wanting to know if its any way possible to have the provider to increase his dosage on the oxyCODONE  (OXY IR/ROXICODONE ) 5 MG immediate release tablet, pt call pt and advise

## 2024-11-12 ENCOUNTER — Ambulatory Visit: Admitting: Infectious Disease

## 2024-11-17 ENCOUNTER — Other Ambulatory Visit (HOSPITAL_COMMUNITY): Payer: Self-pay

## 2024-11-17 ENCOUNTER — Other Ambulatory Visit: Payer: Self-pay | Admitting: Student

## 2024-11-17 ENCOUNTER — Other Ambulatory Visit: Payer: Self-pay | Admitting: Internal Medicine

## 2024-11-17 DIAGNOSIS — Z89611 Acquired absence of right leg above knee: Secondary | ICD-10-CM

## 2024-11-17 NOTE — Telephone Encounter (Unsigned)
 Copied from CRM [id number]. Topic: Clinical - Medication Refill >> Nov 17, 2024 10:34 AM Farrel B wrote: Medication: oxyCODONE  (OXY IR/ROXICODONE ) 5 MG immediate release tablet  Has the patient contacted their pharmacy? Yes pt was advised that the prescription needed to be filled within the next 30 minutes to be received on the 11/18/2023, pt states pharmacist advised to have pcp attach a note stating filled on today 11/18/2023. Pt states the 60 tablets are not working he's in constant pain.    This is the patient's preferred pharmacy:  [facility] - [facility] [address] [place] [address] Phone: [phone] Fax: [phone]    Is this the correct pharmacy for this prescription? Yes If no, delete pharmacy and type the correct one.   Has the prescription been filled recently? Yes  Is the patient out of the medication? No 3 tablets   Has the patient been seen for an appointment in the last year OR does the patient have an upcoming appointment? Yes  Can we respond through MyChart? Yes  Agent: Please be advised that Rx refills may take up to 3 business days. We ask that you follow-up with your pharmacy.

## 2024-11-18 ENCOUNTER — Other Ambulatory Visit: Payer: Self-pay | Admitting: Internal Medicine

## 2024-11-18 ENCOUNTER — Other Ambulatory Visit (HOSPITAL_COMMUNITY): Payer: Self-pay

## 2024-11-18 DIAGNOSIS — Z89611 Acquired absence of right leg above knee: Secondary | ICD-10-CM

## 2024-11-18 MED ORDER — OXYCODONE HCL 5 MG PO TABS
5.0000 mg | ORAL_TABLET | Freq: Two times a day (BID) | ORAL | 0 refills | Status: AC | PRN
  Filled 2024-11-18: qty 60, 30d supply, fill #0

## 2024-11-18 NOTE — Telephone Encounter (Signed)
 Last rx written - 10/20/24. Last OV - 09/22/24. Next OV - 12/09/24.

## 2024-11-19 ENCOUNTER — Other Ambulatory Visit: Payer: Self-pay

## 2024-11-20 ENCOUNTER — Other Ambulatory Visit (HOSPITAL_COMMUNITY): Payer: Self-pay

## 2024-11-22 ENCOUNTER — Other Ambulatory Visit: Payer: Self-pay | Admitting: Student

## 2024-11-22 ENCOUNTER — Other Ambulatory Visit: Payer: Self-pay

## 2024-11-22 ENCOUNTER — Other Ambulatory Visit: Payer: Self-pay | Admitting: Pharmacist

## 2024-11-22 DIAGNOSIS — Z89611 Acquired absence of right leg above knee: Secondary | ICD-10-CM

## 2024-11-23 ENCOUNTER — Other Ambulatory Visit: Payer: Self-pay

## 2024-11-24 ENCOUNTER — Other Ambulatory Visit: Payer: Self-pay

## 2024-11-24 ENCOUNTER — Other Ambulatory Visit: Payer: Self-pay | Admitting: Infectious Disease

## 2024-11-24 DIAGNOSIS — B20 Human immunodeficiency virus [HIV] disease: Secondary | ICD-10-CM

## 2024-11-24 DIAGNOSIS — G629 Polyneuropathy, unspecified: Secondary | ICD-10-CM

## 2024-11-24 DIAGNOSIS — G546 Phantom limb syndrome with pain: Secondary | ICD-10-CM

## 2024-11-25 ENCOUNTER — Other Ambulatory Visit: Payer: Self-pay

## 2024-11-25 ENCOUNTER — Other Ambulatory Visit (HOSPITAL_COMMUNITY): Payer: Self-pay

## 2024-11-25 MED ORDER — RIVAROXABAN 2.5 MG PO TABS
2.5000 mg | ORAL_TABLET | Freq: Two times a day (BID) | ORAL | 11 refills | Status: AC
  Filled 2024-11-25: qty 60, 30d supply, fill #0

## 2024-11-25 NOTE — Telephone Encounter (Signed)
 Medication sent to pharmacy

## 2024-11-26 ENCOUNTER — Other Ambulatory Visit: Payer: Self-pay

## 2024-11-28 ENCOUNTER — Other Ambulatory Visit: Payer: Self-pay

## 2024-12-01 ENCOUNTER — Other Ambulatory Visit: Payer: Self-pay | Admitting: Infectious Disease

## 2024-12-01 DIAGNOSIS — G546 Phantom limb syndrome with pain: Secondary | ICD-10-CM

## 2024-12-01 DIAGNOSIS — B20 Human immunodeficiency virus [HIV] disease: Secondary | ICD-10-CM

## 2024-12-01 DIAGNOSIS — G629 Polyneuropathy, unspecified: Secondary | ICD-10-CM

## 2024-12-02 ENCOUNTER — Other Ambulatory Visit (HOSPITAL_COMMUNITY): Payer: Self-pay

## 2024-12-02 ENCOUNTER — Other Ambulatory Visit: Payer: Self-pay

## 2024-12-02 NOTE — Telephone Encounter (Signed)
 Patient has PCP   Renne Homans, MD   Since 04/21/2023 PCP - General       [phone]

## 2024-12-04 ENCOUNTER — Other Ambulatory Visit: Payer: Self-pay

## 2024-12-09 ENCOUNTER — Ambulatory Visit: Payer: Self-pay | Admitting: Student

## 2024-12-12 ENCOUNTER — Ambulatory Visit: Admitting: Infectious Disease
